# Patient Record
Sex: Male | Born: 1970
Health system: Southern US, Community
[De-identification: ages and names within clinical notes are randomized; demographics above are authoritative.]

---

## 2019-02-23 ENCOUNTER — Emergency Department (HOSPITAL_COMMUNITY): Payer: HRSA Program

## 2019-02-23 ENCOUNTER — Inpatient Hospital Stay (HOSPITAL_COMMUNITY)
Admission: EM | Admit: 2019-02-23 | Discharge: 2019-04-19 | DRG: 004 | Disposition: A | Discharge status: Rehabilitation Facility | Payer: HRSA Program | Attending: Internal Medicine | Admitting: Internal Medicine

## 2019-02-23 ENCOUNTER — Inpatient Hospital Stay (HOSPITAL_COMMUNITY): Payer: HRSA Program

## 2019-02-23 DIAGNOSIS — R1313 Dysphagia, pharyngeal phase: Secondary | ICD-10-CM | POA: Diagnosis not present

## 2019-02-23 DIAGNOSIS — Z930 Tracheostomy status: Secondary | ICD-10-CM

## 2019-02-23 DIAGNOSIS — K567 Ileus, unspecified: Secondary | ICD-10-CM | POA: Diagnosis not present

## 2019-02-23 DIAGNOSIS — R509 Fever, unspecified: Secondary | ICD-10-CM

## 2019-02-23 DIAGNOSIS — I2729 Other secondary pulmonary hypertension: Secondary | ICD-10-CM | POA: Diagnosis not present

## 2019-02-23 DIAGNOSIS — N170 Acute kidney failure with tubular necrosis: Secondary | ICD-10-CM | POA: Diagnosis present

## 2019-02-23 DIAGNOSIS — W1839XA Other fall on same level, initial encounter: Secondary | ICD-10-CM | POA: Diagnosis not present

## 2019-02-23 DIAGNOSIS — N179 Acute kidney failure, unspecified: Secondary | ICD-10-CM

## 2019-02-23 DIAGNOSIS — K7200 Acute and subacute hepatic failure without coma: Secondary | ICD-10-CM | POA: Diagnosis not present

## 2019-02-23 DIAGNOSIS — J9501 Hemorrhage from tracheostomy stoma: Secondary | ICD-10-CM | POA: Diagnosis not present

## 2019-02-23 DIAGNOSIS — J80 Acute respiratory distress syndrome: Secondary | ICD-10-CM | POA: Diagnosis present

## 2019-02-23 DIAGNOSIS — L89810 Pressure ulcer of head, unstageable: Secondary | ICD-10-CM | POA: Diagnosis not present

## 2019-02-23 DIAGNOSIS — R042 Hemoptysis: Secondary | ICD-10-CM | POA: Diagnosis not present

## 2019-02-23 DIAGNOSIS — Z006 Encounter for examination for normal comparison and control in clinical research program: Secondary | ICD-10-CM

## 2019-02-23 DIAGNOSIS — T402X5A Adverse effect of other opioids, initial encounter: Secondary | ICD-10-CM | POA: Diagnosis not present

## 2019-02-23 DIAGNOSIS — E875 Hyperkalemia: Secondary | ICD-10-CM | POA: Diagnosis not present

## 2019-02-23 DIAGNOSIS — Z23 Encounter for immunization: Secondary | ICD-10-CM

## 2019-02-23 DIAGNOSIS — D649 Anemia, unspecified: Secondary | ICD-10-CM | POA: Diagnosis not present

## 2019-02-23 DIAGNOSIS — S0081XA Abrasion of other part of head, initial encounter: Secondary | ICD-10-CM | POA: Diagnosis not present

## 2019-02-23 DIAGNOSIS — Z430 Encounter for attention to tracheostomy: Secondary | ICD-10-CM | POA: Diagnosis not present

## 2019-02-23 DIAGNOSIS — R570 Cardiogenic shock: Secondary | ICD-10-CM | POA: Diagnosis not present

## 2019-02-23 DIAGNOSIS — Z452 Encounter for adjustment and management of vascular access device: Secondary | ICD-10-CM | POA: Diagnosis not present

## 2019-02-23 DIAGNOSIS — R6521 Severe sepsis with septic shock: Secondary | ICD-10-CM | POA: Diagnosis present

## 2019-02-23 DIAGNOSIS — J1289 Other viral pneumonia: Secondary | ICD-10-CM | POA: Diagnosis present

## 2019-02-23 DIAGNOSIS — Y9389 Activity, other specified: Secondary | ICD-10-CM

## 2019-02-23 DIAGNOSIS — D6832 Hemorrhagic disorder due to extrinsic circulating anticoagulants: Secondary | ICD-10-CM | POA: Diagnosis not present

## 2019-02-23 DIAGNOSIS — R7309 Other abnormal glucose: Secondary | ICD-10-CM | POA: Diagnosis not present

## 2019-02-23 DIAGNOSIS — R0902 Hypoxemia: Secondary | ICD-10-CM

## 2019-02-23 DIAGNOSIS — J15211 Pneumonia due to Methicillin susceptible Staphylococcus aureus: Secondary | ICD-10-CM | POA: Diagnosis not present

## 2019-02-23 DIAGNOSIS — Z789 Other specified health status: Secondary | ICD-10-CM | POA: Diagnosis not present

## 2019-02-23 DIAGNOSIS — E669 Obesity, unspecified: Secondary | ICD-10-CM | POA: Diagnosis present

## 2019-02-23 DIAGNOSIS — I959 Hypotension, unspecified: Secondary | ICD-10-CM | POA: Diagnosis not present

## 2019-02-23 DIAGNOSIS — Z419 Encounter for procedure for purposes other than remedying health state, unspecified: Secondary | ICD-10-CM

## 2019-02-23 DIAGNOSIS — T45515A Adverse effect of anticoagulants, initial encounter: Secondary | ICD-10-CM | POA: Diagnosis not present

## 2019-02-23 DIAGNOSIS — Z9289 Personal history of other medical treatment: Secondary | ICD-10-CM | POA: Diagnosis not present

## 2019-02-23 DIAGNOSIS — R000 Tachycardia, unspecified: Secondary | ICD-10-CM | POA: Diagnosis not present

## 2019-02-23 DIAGNOSIS — Z8619 Personal history of other infectious and parasitic diseases: Secondary | ICD-10-CM | POA: Diagnosis not present

## 2019-02-23 DIAGNOSIS — I132 Hypertensive heart and chronic kidney disease with heart failure and with stage 5 chronic kidney disease, or end stage renal disease: Secondary | ICD-10-CM | POA: Diagnosis not present

## 2019-02-23 DIAGNOSIS — R0989 Other specified symptoms and signs involving the circulatory and respiratory systems: Secondary | ICD-10-CM

## 2019-02-23 DIAGNOSIS — E1165 Type 2 diabetes mellitus with hyperglycemia: Secondary | ICD-10-CM | POA: Diagnosis present

## 2019-02-23 DIAGNOSIS — I2609 Other pulmonary embolism with acute cor pulmonale: Secondary | ICD-10-CM | POA: Diagnosis present

## 2019-02-23 DIAGNOSIS — D638 Anemia in other chronic diseases classified elsewhere: Secondary | ICD-10-CM | POA: Diagnosis present

## 2019-02-23 DIAGNOSIS — I2699 Other pulmonary embolism without acute cor pulmonale: Secondary | ICD-10-CM | POA: Diagnosis not present

## 2019-02-23 DIAGNOSIS — I50811 Acute right heart failure: Secondary | ICD-10-CM | POA: Diagnosis not present

## 2019-02-23 DIAGNOSIS — D72829 Elevated white blood cell count, unspecified: Secondary | ICD-10-CM | POA: Diagnosis not present

## 2019-02-23 DIAGNOSIS — K921 Melena: Secondary | ICD-10-CM | POA: Diagnosis not present

## 2019-02-23 DIAGNOSIS — J9600 Acute respiratory failure, unspecified whether with hypoxia or hypercapnia: Secondary | ICD-10-CM

## 2019-02-23 DIAGNOSIS — J9601 Acute respiratory failure with hypoxia: Secondary | ICD-10-CM

## 2019-02-23 DIAGNOSIS — E872 Acidosis: Secondary | ICD-10-CM | POA: Diagnosis present

## 2019-02-23 DIAGNOSIS — J9602 Acute respiratory failure with hypercapnia: Secondary | ICD-10-CM

## 2019-02-23 DIAGNOSIS — J9690 Respiratory failure, unspecified, unspecified whether with hypoxia or hypercapnia: Secondary | ICD-10-CM

## 2019-02-23 DIAGNOSIS — D62 Acute posthemorrhagic anemia: Secondary | ICD-10-CM | POA: Diagnosis not present

## 2019-02-23 DIAGNOSIS — Z01818 Encounter for other preprocedural examination: Secondary | ICD-10-CM

## 2019-02-23 DIAGNOSIS — L8990 Pressure ulcer of unspecified site, unspecified stage: Secondary | ICD-10-CM

## 2019-02-23 DIAGNOSIS — N186 End stage renal disease: Secondary | ICD-10-CM | POA: Diagnosis not present

## 2019-02-23 DIAGNOSIS — Z0189 Encounter for other specified special examinations: Secondary | ICD-10-CM

## 2019-02-23 DIAGNOSIS — E876 Hypokalemia: Secondary | ICD-10-CM | POA: Diagnosis not present

## 2019-02-23 DIAGNOSIS — R112 Nausea with vomiting, unspecified: Secondary | ICD-10-CM

## 2019-02-23 DIAGNOSIS — E119 Type 2 diabetes mellitus without complications: Secondary | ICD-10-CM | POA: Diagnosis not present

## 2019-02-23 DIAGNOSIS — R0603 Acute respiratory distress: Secondary | ICD-10-CM | POA: Diagnosis present

## 2019-02-23 DIAGNOSIS — R069 Unspecified abnormalities of breathing: Secondary | ICD-10-CM

## 2019-02-23 DIAGNOSIS — E1122 Type 2 diabetes mellitus with diabetic chronic kidney disease: Secondary | ICD-10-CM | POA: Diagnosis not present

## 2019-02-23 DIAGNOSIS — A4189 Other specified sepsis: Secondary | ICD-10-CM | POA: Diagnosis present

## 2019-02-23 DIAGNOSIS — G9340 Encephalopathy, unspecified: Secondary | ICD-10-CM | POA: Diagnosis not present

## 2019-02-23 DIAGNOSIS — Z6825 Body mass index (BMI) 25.0-25.9, adult: Secondary | ICD-10-CM

## 2019-02-23 DIAGNOSIS — A419 Sepsis, unspecified organism: Secondary | ICD-10-CM

## 2019-02-23 DIAGNOSIS — D6489 Other specified anemias: Secondary | ICD-10-CM | POA: Diagnosis not present

## 2019-02-23 DIAGNOSIS — Z978 Presence of other specified devices: Secondary | ICD-10-CM

## 2019-02-23 DIAGNOSIS — M7989 Other specified soft tissue disorders: Secondary | ICD-10-CM | POA: Diagnosis not present

## 2019-02-23 DIAGNOSIS — J189 Pneumonia, unspecified organism: Secondary | ICD-10-CM

## 2019-02-23 DIAGNOSIS — Z4659 Encounter for fitting and adjustment of other gastrointestinal appliance and device: Secondary | ICD-10-CM

## 2019-02-23 DIAGNOSIS — J988 Other specified respiratory disorders: Secondary | ICD-10-CM | POA: Diagnosis not present

## 2019-02-23 DIAGNOSIS — R5381 Other malaise: Secondary | ICD-10-CM | POA: Diagnosis not present

## 2019-02-23 DIAGNOSIS — Y92230 Patient room in hospital as the place of occurrence of the external cause: Secondary | ICD-10-CM | POA: Diagnosis not present

## 2019-02-23 DIAGNOSIS — R0602 Shortness of breath: Secondary | ICD-10-CM

## 2019-02-23 DIAGNOSIS — R109 Unspecified abdominal pain: Secondary | ICD-10-CM

## 2019-02-23 LAB — COMPREHENSIVE METABOLIC PANEL
ALT: 125 U/L — ABNORMAL HIGH (ref 0–44)
AST: 204 U/L — ABNORMAL HIGH (ref 15–41)
Albumin: 2.5 g/dL — ABNORMAL LOW (ref 3.5–5.0)
Alkaline Phosphatase: 83 U/L (ref 38–126)
Anion gap: 15 (ref 5–15)
BUN: 13 mg/dL (ref 6–20)
CO2: 17 mmol/L — ABNORMAL LOW (ref 22–32)
Calcium: 7.8 mg/dL — ABNORMAL LOW (ref 8.9–10.3)
Chloride: 105 mmol/L (ref 98–111)
Creatinine, Ser: 0.95 mg/dL (ref 0.61–1.24)
GFR calc Af Amer: >60 mL/min (ref >60)
GFR calc non Af Amer: >60 mL/min (ref >60)
Glucose, Bld: 199 mg/dL — ABNORMAL HIGH (ref 70–99)
Potassium: 4.5 mmol/L (ref 3.5–5.1)
Sodium: 137 mmol/L (ref 135–145)
Total Bilirubin: 1.2 mg/dL (ref 0.3–1.2)
Total Protein: 6.5 g/dL (ref 6.5–8.1)

## 2019-02-23 LAB — BLOOD GAS, ARTERIAL
Acid-base deficit: 5.7 mmol/L — ABNORMAL HIGH (ref 0.0–2.0)
Bicarbonate: 25.1 mmol/L (ref 20.0–28.0)
Drawn by: 270221
FIO2: 100
Nitric Oxide: 40
O2 Saturation: 97.5 %
PEEP: 24 cmH2O
Patient temperature: 98.6
Pressure control: 10 cmH2O
RATE: 24 resp/min
pCO2 arterial: 114 mmHg (ref 32.0–48.0)
pH, Arterial: 6.973 — CL (ref 7.350–7.450)
pO2, Arterial: 189 mmHg — ABNORMAL HIGH (ref 83.0–108.0)

## 2019-02-23 LAB — SARS CORONAVIRUS 2 BY RT PCR (HOSPITAL ORDER, PERFORMED IN ~~LOC~~ HOSPITAL LAB): SARS Coronavirus 2: POSITIVE — AB

## 2019-02-23 LAB — POCT I-STAT 7, (LYTES, BLD GAS, ICA,H+H)
Acid-base deficit: 3 mmol/L — ABNORMAL HIGH (ref 0.0–2.0)
Acid-base deficit: 4 mmol/L — ABNORMAL HIGH (ref 0.0–2.0)
Bicarbonate: 23.2 mmol/L (ref 20.0–28.0)
Bicarbonate: 23.6 mmol/L (ref 20.0–28.0)
Calcium, Ion: 1.09 mmol/L — ABNORMAL LOW (ref 1.15–1.40)
Calcium, Ion: 1 mmol/L — ABNORMAL LOW (ref 1.15–1.40)
HCT: 39 % (ref 39.0–52.0)
HCT: 36 % — ABNORMAL LOW (ref 39.0–52.0)
Hemoglobin: 13.3 g/dL (ref 13.0–17.0)
Hemoglobin: 12.2 g/dL — ABNORMAL LOW (ref 13.0–17.0)
O2 Saturation: 69 %
O2 Saturation: 82 %
Patient temperature: 100
Potassium: 4 mmol/L (ref 3.5–5.1)
Potassium: 4 mmol/L (ref 3.5–5.1)
Sodium: 138 mmol/L (ref 135–145)
Sodium: 139 mmol/L (ref 135–145)
TCO2: 25 mmol/L (ref 22–32)
TCO2: 25 mmol/L (ref 22–32)
pCO2 arterial: 45.3 mmHg (ref 32.0–48.0)
pCO2 arterial: 56.1 mmHg — ABNORMAL HIGH (ref 32.0–48.0)
pH, Arterial: 7.317 — ABNORMAL LOW (ref 7.350–7.450)
pH, Arterial: 7.236 — ABNORMAL LOW (ref 7.350–7.450)
pO2, Arterial: 40 mmHg — CL (ref 83.0–108.0)
pO2, Arterial: 58 mmHg — ABNORMAL LOW (ref 83.0–108.0)

## 2019-02-23 LAB — CBC
HCT: 44 % (ref 39.0–52.0)
Hemoglobin: 14.7 g/dL (ref 13.0–17.0)
MCH: 29.8 pg (ref 26.0–34.0)
MCHC: 33.4 g/dL (ref 30.0–36.0)
MCV: 89.1 fL (ref 80.0–100.0)
Platelets: 166 10*3/uL (ref 150–400)
RBC: 4.94 MIL/uL (ref 4.22–5.81)
RDW: 13.2 % (ref 11.5–15.5)
WBC: 8.2 10*3/uL (ref 4.0–10.5)
nRBC: 0 % (ref 0.0–0.2)

## 2019-02-23 LAB — CBC WITH DIFFERENTIAL/PLATELET
Abs Immature Granulocytes: 0.1 10*3/uL — ABNORMAL HIGH (ref 0.00–0.07)
Basophils Absolute: 0 10*3/uL (ref 0.0–0.1)
Basophils Relative: 0 %
Eosinophils Absolute: 0 10*3/uL (ref 0.0–0.5)
Eosinophils Relative: 0 %
HCT: 44.3 % (ref 39.0–52.0)
Hemoglobin: 14.7 g/dL (ref 13.0–17.0)
Immature Granulocytes: 1 %
Lymphocytes Relative: 13 %
Lymphs Abs: 1.3 10*3/uL (ref 0.7–4.0)
MCH: 29.3 pg (ref 26.0–34.0)
MCHC: 33.2 g/dL (ref 30.0–36.0)
MCV: 88.4 fL (ref 80.0–100.0)
Monocytes Absolute: 0.2 10*3/uL (ref 0.1–1.0)
Monocytes Relative: 2 %
Neutro Abs: 8.5 10*3/uL — ABNORMAL HIGH (ref 1.7–7.7)
Neutrophils Relative %: 84 %
Platelets: 192 10*3/uL (ref 150–400)
RBC: 5.01 MIL/uL (ref 4.22–5.81)
RDW: 12.8 % (ref 11.5–15.5)
WBC: 10.1 10*3/uL (ref 4.0–10.5)
nRBC: 0 % (ref 0.0–0.2)

## 2019-02-23 LAB — TRIGLYCERIDES
Triglycerides: 233 mg/dL — ABNORMAL HIGH (ref <150)
Triglycerides: 526 mg/dL — ABNORMAL HIGH (ref <150)

## 2019-02-23 LAB — FERRITIN: Ferritin: 2066 ng/mL — ABNORMAL HIGH (ref 24–336)

## 2019-02-23 LAB — C-REACTIVE PROTEIN: CRP: 17.8 mg/dL — ABNORMAL HIGH (ref <1.0)

## 2019-02-23 LAB — FIBRINOGEN: Fibrinogen: 756 mg/dL — ABNORMAL HIGH (ref 210–475)

## 2019-02-23 LAB — LACTATE DEHYDROGENASE: LDH: 1172 U/L — ABNORMAL HIGH (ref 98–192)

## 2019-02-23 LAB — ABO/RH: ABO/RH(D): O POS

## 2019-02-23 LAB — D-DIMER, QUANTITATIVE: D-Dimer, Quant: 11.18 ug/mL-FEU — ABNORMAL HIGH (ref 0.00–0.50)

## 2019-02-23 LAB — GLUCOSE, CAPILLARY: Glucose-Capillary: 188 mg/dL — ABNORMAL HIGH (ref 70–99)

## 2019-02-23 LAB — LACTIC ACID, PLASMA: Lactic Acid, Venous: 1.5 mmol/L (ref 0.5–1.9)

## 2019-02-23 LAB — CREATININE, SERUM
Creatinine, Ser: 1.1 mg/dL (ref 0.61–1.24)
GFR calc Af Amer: >60 mL/min (ref >60)
GFR calc non Af Amer: >60 mL/min (ref >60)

## 2019-02-23 LAB — MRSA PCR SCREENING: MRSA by PCR: NEGATIVE

## 2019-02-23 LAB — PROCALCITONIN: Procalcitonin: 4.53 ng/mL

## 2019-02-23 MED ORDER — FENTANYL 2500MCG IN NS 250ML (10MCG/ML) PREMIX INFUSION
0.0000 ug/h | INTRAVENOUS | Status: DC
  Administered 2019-02-23: 250 ug/h via INTRAVENOUS
  Filled 2019-02-23: qty 250

## 2019-02-23 MED ORDER — ROCURONIUM BROMIDE 50 MG/5ML IV SOLN
50.0000 mg | Freq: Once | INTRAVENOUS | Status: AC
  Administered 2019-02-23: 50 mg via INTRAVENOUS
  Filled 2019-02-23: qty 5

## 2019-02-23 MED ORDER — FENTANYL CITRATE (PF) 100 MCG/2ML IJ SOLN: 50.0000 ug | Freq: Once | INTRAMUSCULAR | Status: AC

## 2019-02-23 MED ORDER — FENTANYL 2500MCG IN NS 250ML (10MCG/ML) PREMIX INFUSION
50.0000 ug/h | INTRAVENOUS | Status: DC
  Administered 2019-02-24: 125 ug/h via INTRAVENOUS
  Administered 2019-02-25: 04:00:00 200 ug/h via INTRAVENOUS
  Filled 2019-02-23 (×3): qty 250

## 2019-02-23 MED ORDER — SODIUM CHLORIDE 0.9 % IV BOLUS: 500.0000 mL | Freq: Once | INTRAVENOUS | Status: AC

## 2019-02-23 MED ORDER — FENTANYL CITRATE (PF) 100 MCG/2ML IJ SOLN
100.0000 ug | Freq: Once | INTRAMUSCULAR | Status: AC
  Administered 2019-02-23: 10:00:00 100 ug via INTRAVENOUS

## 2019-02-23 MED ORDER — HYDROXYCHLOROQUINE SULFATE 200 MG PO TABS
400.0000 mg | ORAL_TABLET | Freq: Two times a day (BID) | ORAL | Status: AC
  Administered 2019-02-23 (×2): 400 mg via ORAL
  Filled 2019-02-23 (×2): qty 2

## 2019-02-23 MED ORDER — ETOMIDATE 2 MG/ML IV SOLN
INTRAVENOUS | Status: AC | PRN
  Administered 2019-02-23: 30 mg via INTRAVENOUS

## 2019-02-23 MED ORDER — ACETAMINOPHEN 650 MG RE SUPP
650.0000 mg | Freq: Four times a day (QID) | RECTAL | Status: DC | PRN
  Administered 2019-02-23 – 2019-02-26 (×3): 650 mg via RECTAL
  Filled 2019-02-23 (×2): qty 1

## 2019-02-23 MED ORDER — DOCUSATE SODIUM 100 MG PO CAPS: 100.0000 mg | ORAL_CAPSULE | Freq: Two times a day (BID) | ORAL | Status: DC

## 2019-02-23 MED ORDER — VITAL AF 1.2 CAL PO LIQD
1000.0000 mL | ORAL | Status: DC
  Administered 2019-02-23 – 2019-02-28 (×6): 1000 mL

## 2019-02-23 MED ORDER — FENTANYL BOLUS VIA INFUSION
50.0000 ug | INTRAVENOUS | Status: DC | PRN
  Administered 2019-02-23 – 2019-02-24 (×2): 50 ug via INTRAVENOUS
  Filled 2019-02-23: qty 50

## 2019-02-23 MED ORDER — SODIUM CHLORIDE 0.9 % IV BOLUS
500.0000 mL | Freq: Once | INTRAVENOUS | Status: AC
  Administered 2019-02-23: 500 mL via INTRAVENOUS

## 2019-02-23 MED ORDER — SODIUM BICARBONATE 8.4 % IV SOLN
INTRAVENOUS | Status: AC
  Filled 2019-02-23: qty 100

## 2019-02-23 MED ORDER — DOCUSATE SODIUM 50 MG/5ML PO LIQD
100.0000 mg | Freq: Two times a day (BID) | ORAL | Status: DC
  Administered 2019-02-24 – 2019-03-19 (×42): 100 mg
  Filled 2019-02-23 (×47): qty 10

## 2019-02-23 MED ORDER — ALBUMIN HUMAN 25 % IV SOLN
25.0000 g | Freq: Once | INTRAVENOUS | Status: AC
  Administered 2019-02-23: 25 g via INTRAVENOUS
  Filled 2019-02-23: qty 50

## 2019-02-23 MED ORDER — PROPOFOL 1000 MG/100ML IV EMUL
5.0000 ug/kg/min | INTRAVENOUS | Status: DC
  Administered 2019-02-23: 35 ug/kg/min via INTRAVENOUS
  Administered 2019-02-23: 80 ug/kg/min via INTRAVENOUS
  Administered 2019-02-23: 60 ug/kg/min via INTRAVENOUS
  Filled 2019-02-23 (×3): qty 100

## 2019-02-23 MED ORDER — VITAL HIGH PROTEIN PO LIQD: 1000.0000 mL | ORAL | Status: DC

## 2019-02-23 MED ORDER — VECURONIUM BROMIDE 10 MG IV SOLR
0.1000 mg/kg | INTRAVENOUS | Status: DC | PRN
  Administered 2019-02-23 – 2019-02-27 (×8): 7.8 mg via INTRAVENOUS
  Filled 2019-02-23 (×9): qty 10

## 2019-02-23 MED ORDER — CLONAZEPAM 0.5 MG PO TABS
2.0000 mg | ORAL_TABLET | Freq: Four times a day (QID) | ORAL | Status: DC
  Administered 2019-02-23 – 2019-02-24 (×3): 2 mg
  Filled 2019-02-23 (×3): qty 4

## 2019-02-23 MED ORDER — HEPARIN SODIUM (PORCINE) 5000 UNIT/ML IJ SOLN
5000.0000 [IU] | Freq: Three times a day (TID) | INTRAMUSCULAR | Status: DC
  Administered 2019-02-23: 5000 [IU] via SUBCUTANEOUS
  Filled 2019-02-23: qty 1

## 2019-02-23 MED ORDER — FENTANYL CITRATE (PF) 100 MCG/2ML IJ SOLN
INTRAMUSCULAR | Status: AC
  Administered 2019-02-23: 100 ug via INTRAVENOUS
  Filled 2019-02-23: qty 2

## 2019-02-23 MED ORDER — SODIUM CHLORIDE 0.9 % IV SOLN
500.0000 mg | INTRAVENOUS | Status: DC
  Administered 2019-02-23 – 2019-02-26 (×4): 500 mg via INTRAVENOUS
  Filled 2019-02-23 (×5): qty 500

## 2019-02-23 MED ORDER — QUETIAPINE FUMARATE 50 MG PO TABS
50.0000 mg | ORAL_TABLET | Freq: Two times a day (BID) | ORAL | Status: DC
  Administered 2019-02-23 – 2019-03-17 (×42): 50 mg via ORAL
  Filled 2019-02-23 (×43): qty 1

## 2019-02-23 MED ORDER — FENTANYL CITRATE (PF) 100 MCG/2ML IJ SOLN
INTRAMUSCULAR | Status: AC
  Administered 2019-02-23: 100 ug
  Filled 2019-02-23: qty 2

## 2019-02-23 MED ORDER — ROCURONIUM BROMIDE 50 MG/5ML IV SOLN
INTRAVENOUS | Status: AC | PRN
  Administered 2019-02-23: 100 mg via INTRAVENOUS

## 2019-02-23 MED ORDER — NOREPINEPHRINE 4 MG/250ML-% IV SOLN
0.0000 ug/min | INTRAVENOUS | Status: DC
  Administered 2019-02-23: 2 ug/min via INTRAVENOUS
  Administered 2019-02-23 – 2019-02-24 (×3): 40 ug/min via INTRAVENOUS
  Administered 2019-02-24: 18 ug/min via INTRAVENOUS
  Administered 2019-02-24: 40 ug/min via INTRAVENOUS
  Administered 2019-02-24: 38 ug/min via INTRAVENOUS
  Administered 2019-02-24: 22 ug/min via INTRAVENOUS
  Administered 2019-02-24: 34 ug/min via INTRAVENOUS
  Filled 2019-02-23 (×12): qty 250

## 2019-02-23 MED ORDER — MIDAZOLAM HCL 2 MG/2ML IJ SOLN
2.0000 mg | INTRAMUSCULAR | Status: AC | PRN
  Administered 2019-02-23 – 2019-02-27 (×3): 2 mg via INTRAVENOUS
  Filled 2019-02-23: qty 2

## 2019-02-23 MED ORDER — LORAZEPAM 2 MG/ML IJ SOLN
4.0000 mg | INTRAMUSCULAR | Status: DC | PRN
  Administered 2019-02-25 – 2019-03-04 (×10): 4 mg via INTRAVENOUS
  Filled 2019-02-23 (×11): qty 2

## 2019-02-23 MED ORDER — SODIUM BICARBONATE 8.4 % IV SOLN
100.0000 meq | Freq: Once | INTRAVENOUS | Status: AC
  Administered 2019-02-23: 100 meq via INTRAVENOUS

## 2019-02-23 MED ORDER — MIDAZOLAM HCL 2 MG/2ML IJ SOLN
2.0000 mg | INTRAMUSCULAR | Status: DC | PRN
  Administered 2019-02-27 – 2019-03-08 (×24): 2 mg via INTRAVENOUS
  Filled 2019-02-23 (×13): qty 2

## 2019-02-23 MED ORDER — VASOPRESSIN 20 UNIT/ML IV SOLN
0.0300 [IU]/min | INTRAVENOUS | Status: DC
  Administered 2019-02-23 – 2019-02-24 (×2): 0.03 [IU]/min via INTRAVENOUS
  Filled 2019-02-23 (×2): qty 2

## 2019-02-23 MED ORDER — SODIUM CHLORIDE 0.9 % IV SOLN
INTRAVENOUS | Status: DC
  Administered 2019-02-24 – 2019-03-01 (×5): via INTRAVENOUS

## 2019-02-23 MED ORDER — FENTANYL 100 MCG/HR TD PT72
1.0000 | MEDICATED_PATCH | TRANSDERMAL | Status: DC
  Administered 2019-02-23 – 2019-02-26 (×2): 1 via TRANSDERMAL
  Filled 2019-02-23 (×2): qty 1

## 2019-02-23 MED ORDER — SODIUM CHLORIDE 0.9 % IV SOLN: INTRAVENOUS | Status: DC | PRN

## 2019-02-23 MED ORDER — ARTIFICIAL TEARS OPHTHALMIC OINT
1.0000 "application " | TOPICAL_OINTMENT | Freq: Three times a day (TID) | OPHTHALMIC | Status: DC
  Administered 2019-02-23 – 2019-02-27 (×12): 1 via OPHTHALMIC
  Filled 2019-02-23 (×3): qty 3.5

## 2019-02-23 MED ORDER — INSULIN ASPART 100 UNIT/ML ~~LOC~~ SOLN
2.0000 [IU] | SUBCUTANEOUS | Status: DC
  Administered 2019-02-23: 15:00:00 4 [IU] via SUBCUTANEOUS
  Administered 2019-02-23 – 2019-02-24 (×3): 6 [IU] via SUBCUTANEOUS

## 2019-02-23 MED ORDER — FENTANYL CITRATE (PF) 100 MCG/2ML IJ SOLN
100.0000 ug | INTRAMUSCULAR | Status: DC | PRN
  Administered 2019-02-24 (×2): 100 ug via INTRAVENOUS

## 2019-02-23 MED ORDER — PROPOFOL 10 MG/ML IV BOLUS
INTRAVENOUS | Status: AC
  Filled 2019-02-23: qty 20

## 2019-02-23 MED ORDER — ACETAMINOPHEN 650 MG RE SUPP
650.0000 mg | Freq: Once | RECTAL | Status: AC
  Filled 2019-02-23: qty 1

## 2019-02-23 MED ORDER — FENTANYL CITRATE (PF) 100 MCG/2ML IJ SOLN
100.0000 ug | INTRAMUSCULAR | Status: DC | PRN
  Administered 2019-02-23: 100 ug via INTRAVENOUS
  Filled 2019-02-23: qty 2

## 2019-02-23 MED ORDER — PROPOFOL 1000 MG/100ML IV EMUL
INTRAVENOUS | Status: AC
  Administered 2019-02-23: 11:00:00
  Filled 2019-02-23: qty 100

## 2019-02-23 MED ORDER — HYDROXYCHLOROQUINE SULFATE 200 MG PO TABS
200.0000 mg | ORAL_TABLET | Freq: Two times a day (BID) | ORAL | Status: DC
  Filled 2019-02-23 (×2): qty 1

## 2019-02-23 MED ORDER — ENOXAPARIN SODIUM 40 MG/0.4ML ~~LOC~~ SOLN
0.5000 mg/kg | Freq: Two times a day (BID) | SUBCUTANEOUS | Status: DC
  Administered 2019-02-23 – 2019-02-24 (×2): 40 mg via SUBCUTANEOUS
  Filled 2019-02-23 (×2): qty 0.4

## 2019-02-23 MED ORDER — FENTANYL CITRATE (PF) 100 MCG/2ML IJ SOLN
INTRAMUSCULAR | Status: AC | PRN
  Administered 2019-02-23: 100 ug via INTRAVENOUS

## 2019-02-23 MED ORDER — FAMOTIDINE 40 MG/5ML PO SUSR
20.0000 mg | Freq: Two times a day (BID) | ORAL | Status: DC
  Administered 2019-02-23 – 2019-02-24 (×3): 20 mg
  Filled 2019-02-23 (×4): qty 2.5

## 2019-02-23 MED ORDER — PROPOFOL 1000 MG/100ML IV EMUL
INTRAVENOUS | Status: AC
  Administered 2019-02-23: 74 ug/kg/min
  Filled 2019-02-23: qty 100

## 2019-02-23 MED ORDER — SODIUM CHLORIDE 0.9 % IV SOLN
2.0000 g | INTRAVENOUS | Status: AC
  Administered 2019-02-23 – 2019-02-27 (×5): 2 g via INTRAVENOUS
  Filled 2019-02-23 (×5): qty 20

## 2019-02-23 NOTE — Progress Notes (Signed)
[facility] Progress Note Patient Name: Jonathan [name] DOB: January 28, 1971 MRN: [id number]   Date of Service  02/23/2019  HPI/Events of Note  Notified of hypotension and oliguria despite coming down on sedation. Still on propofol 35 and Fentanyl 100  eICU Interventions   Will give a dose of albumin and reassess  Continue to titrate down sedation as patient continues to be sedated and synchronized on the vent     Intervention Category Major Interventions: Hypotension - evaluation and management  Judd Lien 02/23/2019, 10:54 PM

## 2019-02-23 NOTE — Progress Notes (Signed)
Initial Nutrition Assessment  DOCUMENTATION CODES:   Not applicable  INTERVENTION:   Per ASPEN guidelines, recommend continue gastric feedings during prone positioning, keeping HOB elevated (reverse trendelenburg) to at least 10-25 degrees.    Vital AF 1.2 at 20 ml/h (trickle per MD order)  Recommend increase by 10 ml every 8 hours to goal rate of 75 ml/h (1800 ml per day)  Provides 2160 kcal, 135 gm protein, 1460 ml free water daily  NUTRITION DIAGNOSIS:   Inadequate oral intake related to inability to eat as evidenced by NPO status.  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 Admitted with fever, cough, AMS, and respiratory distress. COVID-19 positive.    Patient is currently intubated on ventilator support. Currently proned.  MV: 9.6 L/min Temp (24hrs), Avg:103.1 F (39.5 C), Min:103 F (39.4 C), Max:103.2 F (39.6 C)  Propofol: currently off   Labs reviewed. Medications reviewed and include Novolog, Fentanyl, Levophed, propofol.   NUTRITION - FOCUSED PHYSICAL EXAM:  unable to complete  Diet Order:   Diet Order            Diet NPO time specified  Diet effective now              EDUCATION NEEDS:   No education needs have been identified at this time  Skin:  Skin Assessment: Reviewed RN Assessment  Last BM:  unknown  Height:   Ht Readings from Last 1 Encounters:  02/23/19 5\' 5"  (1.651 m)    Weight:   Wt Readings from Last 1 Encounters:  02/23/19 77.7 kg    Ideal Body Weight:  61.8 kg  BMI:  Body mass index is 28.51 kg/m.  Estimated Nutritional Needs:   Kcal:  2210  Protein:  115-130 gm  Fluid:  2.2 L    Molli Barrows, RD, LDN,  [place] Pager [phone] After Hours Pager [phone]

## 2019-02-23 NOTE — Progress Notes (Signed)
MD notified of panic ABG values.  Discussed vent changes with MD and applied appropriate changes to ventilator.  Repeat ABG when appropriate.

## 2019-02-23 NOTE — Procedures (Signed)
Arterial Catheter Insertion Procedure Note Jonathan [name] [id number] October 08, 1971  Procedure: Insertion of Arterial Catheter  Indications: Blood pressure monitoring and Frequent blood sampling  Procedure Details Consent: Unable to obtain consent because of emergent medical necessity. Time Out: Verified patient identification, verified procedure, site/side was marked, verified correct patient position, special equipment/implants available, medications/allergies/relevent history reviewed, required imaging and test results available.  Performed  Maximum sterile technique was used including antiseptics, cap, gloves, gown, hand hygiene and mask. Skin prep: Chlorhexidine; local anesthetic administered 20 gauge catheter was inserted into left radial artery using the Seldinger technique. ULTRASOUND GUIDANCE USED: YES Evaluation Blood flow good; BP tracing good. Complications: No apparent complications.   Ulice Dash 02/23/2019

## 2019-02-23 NOTE — Progress Notes (Signed)
Positive COVID-19 patient with verbal orders to defer 12 lead EKG at this time per CCM, Dr. Lynetta Mare.

## 2019-02-23 NOTE — ED Notes (Signed)
EDP inserting central line at this time.

## 2019-02-23 NOTE — H&P (Signed)
NAME:  Jonathan [name], MRN:  [id number], DOB:  1970-12-10, LOS: 0 ADMISSION DATE:  02/23/2019, CONSULTATION DATE:  02/23/2019 REFERRING MD:  Lian[facility] ED, CHIEF COMPLAINT:  Respiratory failure   Brief History   48 year old man intubated for severe respiratory failure requiring prone ventilation and nitric oxide for ARDS related to COVID-19.  History of present illness   History obtained from notes and patients employer.  48 year old man, recently diagnosed with T2DM. 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 Fever, mild cough and malaise starting 02/21/2019. Some improvement yesterday but developed respiratory distress and altered mental status.  Intubated in ED.   Past Medical History  Limited information. T2DM recently diagnosed on OHA.   Significant Hospital Events   4/21 admitted and immediately proned on inverse ratio PC ventilation with high PEEP to maintain saturation >85%, Started on NO at 40ppm.  Consults:  PCCM 4/21  Procedures:  4/21 - ETT 7.5, RIJ [facility]  Significant Diagnostic Tests:  4/21 CXR (reviewed by Dr Lynetta Mare) - bilateral alveolar interstitial pattern.  Micro Data:  Blood cultures -PND SARS-CoV2 - POSITIVE   Antimicrobials:  Azithromycin 4/21 Ceftriaxone 4/21 Hydroxychloroquine 4/21   Interim history/subjective:  Continued low saturations with nadir in 60's.  Sedated and NMB initiated with improvement to 70's. Proned on arrival with saturation drop back to 50's. Improved with increased PEEP to 24 and inverse ratio PC ventilation. NO initiated with improvement in saturations.   Objective   Blood pressure 140/89, pulse (!) 126, temperature (!) 103 F (39.4 C), temperature source Oral, resp. rate (!) 24, height 5\' 5"  (1.651 m), SpO2 92 %.    Vent Mode: PCV FiO2 (%):  [100 %] 100 % Set Rate:  [24 bmp] 24 bmp Vt Set:  [370 mL] 370 mL  PEEP:  [15 cmH20-24 cmH20] 24 cmH20 Plateau Pressure:  [25 cmH20-37 cmH20] 37 cmH20   Intake/Output Summary (Last 24 hours) at 02/23/2019 1241 Last data filed at 02/23/2019 1200 Gross per 24 hour  Intake -  Output 450 ml  Net -450 ml   There were no vitals filed for this visit.  Examination: General: Appears in good general health, moderately overweight. HENT: OGT/ETT with pressure ulceration. Lungs: Marked bronchial breathing at both bases. Pplat 35 on 24 PEEP. Driving pressure 10 provides   Cardiovascular: JVP flat. HS normal. Extremities warm, no edema. Abdomen: Soft and non-tender. Extremities: No joint swelling.   Neuro: Sedated and chemically paralyzed. GU: Foley catheter inserted, clear urine.  I performed a POC U/S: Bilateral interstitial pattern with bilateral dense consolidation at bases. Echo shows low-normal LVSF. RV moderately dilated with septal shift.   Resolved Hospital Problem list   No   Assessment & Plan:   Active issues:    Critically ill due to severe hypoxic respiratory failure requiring prone mechanical ventilation and NO Prone mechanical ventilation for 16-24 hours Avoid pressure ulceration Nitric oxide at 40ppm - can go to 80ppm (suggested in NOARDSCOVID trial) Possible early acute cor pulmonale - should improve with prone ventilation.  Severe ARDS - P/F 40. Lung protective ventilation - Continue inverse ratio ventilation, high PEEP  until after next turn, then attempt conventional ventilation mode. Continue NMB while on prone ventilation. Conservative fluid strategy - target total fluid intake of 75-111ml/h per day.  Confirmed COVID 19 infection Hydroxychloroquine per protocol  Other medical conditions  T2DM Phase 1 glycemic  protocol  Best practice:  Diet: trickle feed only while prone Pain/Anxiety/Delirium protocol (if indicated): Fentanyl, Propofol gtts, intermittent lorazepam. Target BIS <40. Start topical fentanyl, clonazepam and  quetiapine. VAP protocol (if indicated): Bundle in place. DVT prophylaxis: UFH GI prophylaxis: famotidine Glucose control: Phase 1 protocol Mobility: bedrest Code Status: Full Family Communication: family in [place]. Have communicated with employer. Disposition: ICU.  Labs   CBC: Recent Labs  Lab 02/23/19 0946 02/23/19 1004  WBC 10.1  --   NEUTROABS 8.5*  --   HGB 14.7 13.3  HCT 44.3 39.0  MCV 88.4  --   PLT 192  --     Basic Metabolic Panel: Recent Labs  Lab 02/23/19 0946 02/23/19 1004  NA 137 138  K 4.5 4.0  CL 105  --   CO2 17*  --   GLUCOSE 199*  --   BUN 13  --   CREATININE 0.95  --   CALCIUM 7.8*  --    GFR: CrCl cannot be calculated (Unknown ideal weight.). Recent Labs  Lab 02/23/19 0946  PROCALCITON 4.53  WBC 10.1    Liver Function Tests: Recent Labs  Lab 02/23/19 0946  AST 204*  ALT 125*  ALKPHOS 83  BILITOT 1.2  PROT 6.5  ALBUMIN 2.5*   No results for input(s): LIPASE, AMYLASE in the last 168 hours. No results for input(s): AMMONIA in the last 168 hours.  ABG    Component Value Date/Time   PHART 7.317 (L) 02/23/2019 1004   PCO2ART 45.3 02/23/2019 1004   PO2ART 40.0 (LL) 02/23/2019 1004   HCO3 23.2 02/23/2019 1004   TCO2 25 02/23/2019 1004   ACIDBASEDEF 3.0 (H) 02/23/2019 1004   O2SAT 69.0 02/23/2019 1004     Coagulation Profile: No results for input(s): INR, PROTIME in the last 168 hours.  Cardiac Enzymes: No results for input(s): CKTOTAL, CKMB, CKMBINDEX, TROPONINI in the last 168 hours.  HbA1C: No results found for: HGBA1C  CBG: No results for input(s): GLUCAP in the last 168 hours.  Review of Systems:   Review of Systems - unable to provide due to critical illness.  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  Family History   His family history is not on file.   Allergies Allergies not on file   Home Medications  Prior to Admission medications   Not on File     CRITICAL CARE Performed  by: Kipp Brood   Total critical care time: 120 minutes  Critical care time was exclusive of separately billable procedures and treating other patients.  Critical care was necessary to treat or prevent imminent or life-threatening deterioration.  Critical care was time spent personally by me on the following activities: development of treatment plan with patient and/or surrogate as well as nursing, discussions with consultants, evaluation of patient's response to treatment, examination of patient, obtaining history from patient or surrogate, ordering and performing treatments and interventions, ordering and review of laboratory studies, ordering and review of radiographic studies, pulse oximetry, re-evaluation of patient's condition and participation in multidisciplinary rounds.  Kipp Brood, MD [facility] [place]  Pager: [phone] Mobile: [phone] After hours: [phone].

## 2019-02-23 NOTE — Progress Notes (Signed)
S/W Dr. Bedelia Person re: pt BP.  She suggested reducing Propofol to help BP even though pt BIS reading high 50s to mid 60s since clinically pt looks comfortable.  Well reduce propofol and continue to monitor closely.

## 2019-02-23 NOTE — ED Provider Notes (Signed)
[facility] EMERGENCY DEPARTMENT Provider Note   CSN: [id number] Arrival date & time: 02/23/19  3716  LEVEL 5 CAVEAT - RESPIRATORY DISTRESS  History   Chief Complaint No chief complaint on file.   HPI Derrien Sanda Linger is a 48 y.o. male.     HPI  48 year old male presents with respiratory distress.  Patient history is taken from EMS as the patient is altered and speaks Spanish only.  According to EMS he recently got back from [place] about 9 days ago.  Has had cough for the last few days and this morning family was concerned that he was having a diabetic crisis because he was acting altered.  Initial O2 sat 50% with EMS.  He is on a nonrebreather currently but EMS stated that he has had a couple periods of apnea and had to be bag-valve-mask ventilated.  He is now awake and a little more compliant though cannot answer questions due to language barrier and/or altered mental state.  Apparently he was due to get coronavirus testing today.  No past medical history on file.  There are no active problems to display for this patient.       Home Medications    Prior to Admission medications   Not on File    Family History No family history on file.  Social History Social History   Tobacco Use  . Smoking status: Not on file  Substance Use Topics  . Alcohol use: Not on file  . Drug use: Not on file     Allergies   Patient has no allergy information on record.   Review of Systems Review of Systems  Unable to perform ROS: Severe respiratory distress     Physical Exam Updated Vital Signs BP (!) 181/94   Pulse (!) 128   Temp (!) 103 F (39.4 C) (Oral)   Resp (!) 25   Ht 5\' 7"  (1.702 m)   SpO2 (!) 74%   Physical Exam Vitals signs and nursing note reviewed.  Constitutional:      General: He is in acute distress.     Appearance: He is well-developed. He is ill-appearing and diaphoretic.  HENT:     Head: Normocephalic and atraumatic.   Right Ear: External ear normal.     Left Ear: External ear normal.     Nose: Nose normal.  Eyes:     General:        Right eye: No discharge.        Left eye: No discharge.  Neck:     Musculoskeletal: Normal range of motion and neck supple.  Cardiovascular:     Rate and Rhythm: Regular rhythm. Tachycardia present.  Pulmonary:     Effort: Tachypnea, accessory muscle usage and respiratory distress present.  Abdominal:     General: There is no distension.  Skin:    General: Skin is warm.  Neurological:     Mental Status: He is alert.     Comments: Alert, seems to look at me when prompted but does not answer questions to me or interpreter  Psychiatric:        Mood and Affect: Mood is not anxious.      ED Treatments / Results  Labs (all labs ordered are listed, but only abnormal results are displayed) Labs Reviewed  SARS CORONAVIRUS 2 ([facility]) - Abnormal; Notable for the following components:      Result Value   SARS Coronavirus 2  POSITIVE (*)    All other components within normal limits  CBC WITH DIFFERENTIAL/PLATELET - Abnormal; Notable for the following components:   Neutro Abs 8.5 (*)    Abs Immature Granulocytes 0.10 (*)    All other components within normal limits  COMPREHENSIVE METABOLIC PANEL - Abnormal; Notable for the following components:   CO2 17 (*)    Glucose, Bld 199 (*)    Calcium 7.8 (*)    Albumin 2.5 (*)    AST 204 (*)    ALT 125 (*)    All other components within normal limits  D-DIMER, QUANTITATIVE (NOT AT [facility]) - Abnormal; Notable for the following components:   D-Dimer, Quant 11.18 (*)    All other components within normal limits  LACTATE DEHYDROGENASE - Abnormal; Notable for the following components:   LDH 1,172 (*)    All other components within normal limits  TRIGLYCERIDES - Abnormal; Notable for the following components:   Triglycerides 233 (*)    All other components within normal limits   FIBRINOGEN - Abnormal; Notable for the following components:   Fibrinogen 756 (*)    All other components within normal limits  C-REACTIVE PROTEIN - Abnormal; Notable for the following components:   CRP 17.8 (*)    All other components within normal limits  FERRITIN - Abnormal; Notable for the following components:   Ferritin 2,066 (*)    All other components within normal limits  POCT I-STAT 7, (LYTES, BLD GAS, ICA,H+H) - Abnormal; Notable for the following components:   pH, Arterial 7.317 (*)    pO2, Arterial 40.0 (*)    Acid-base deficit 3.0 (*)    Calcium, Ion 1.09 (*)    All other components within normal limits  MRSA PCR SCREENING  CULTURE, BLOOD (ROUTINE X 2)  CULTURE, BLOOD (ROUTINE X 2)  LACTIC ACID, PLASMA  PROCALCITONIN  CBC  CREATININE, SERUM  BLOOD GAS, ARTERIAL  URINALYSIS, ROUTINE W REFLEX MICROSCOPIC  HIV ANTIBODY (ROUTINE TESTING W REFLEX)  INTERLEUKIN-6, PLASMA  LEGIONELLA PNEUMOPHILA SEROGP 1 UR AG  TRIGLYCERIDES  ABO/RH    EKG None  Radiology No results found.  Procedures Procedure Name: Intubation Date/Time: 02/23/2019 10:36 AM Performed by: Sherwood Gambler, MD Pre-anesthesia Checklist: Suction available and Emergency Drugs available Oxygen Delivery Method: Non-rebreather mask Preoxygenation: Pre-oxygenation with 100% oxygen Induction Type: Rapid sequence Laryngoscope Size: Glidescope and 3 Grade View: Grade II Tube size: 7.5 mm Number of attempts: 1 Placement Confirmation: ETT inserted through vocal cords under direct vision and CO2 detector Secured at: 23 cm Tube secured with: ETT holder Dental Injury: Injury to lip  Difficulty Due To: Difficulty was unanticipated Comments: Airway was anterior, difficult to get to but then easy to pass tube    .Central Line Date/Time: 02/23/2019 10:38 AM Performed by: Sherwood Gambler, MD Authorized by: Sherwood Gambler, MD   Consent:    Consent obtained:  Emergent situation Pre-procedure details:     Skin preparation:  ChloraPrep   Skin preparation agent: Skin preparation agent completely dried prior to procedure   Anesthesia (see MAR for exact dosages):    Anesthesia method:  None Procedure details:    Location:  R internal jugular   Patient position:  Trendelenburg   Procedural supplies:  Triple lumen   Catheter size:  7.5 Fr   Landmarks identified: yes     Ultrasound guidance: yes     Sterile ultrasound techniques: Sterile gel and sterile probe covers were used     Number of attempts:  2   Successful placement: yes   Post-procedure details:    Post-procedure:  Dressing applied and line sutured   Assessment:  Blood return through all ports, free fluid flow, placement verified by x-ray and no pneumothorax on x-ray   Patient tolerance of procedure:  Tolerated well, no immediate complications .Critical Care Performed by: Sherwood Gambler, MD Authorized by: Sherwood Gambler, MD   Critical care provider statement:    Critical care time (minutes):  60   Critical care time was exclusive of:  Separately billable procedures and treating other patients   Critical care was necessary to treat or prevent imminent or life-threatening deterioration of the following conditions:  Respiratory failure, shock and circulatory failure   Critical care was time spent personally by me on the following activities:  Discussions with consultants, evaluation of patient's response to treatment, examination of patient, ordering and performing treatments and interventions, ordering and review of laboratory studies, ordering and review of radiographic studies, pulse oximetry, re-evaluation of patient's condition, obtaining history from patient or surrogate, review of old charts and ventilator management   (including critical care time)  Medications Ordered in ED Medications  propofol (DIPRIVAN) 1000 MG/100ML infusion (has no administration in time range)  fentaNYL (SUBLIMAZE) 100 MCG/2ML injection (100 mcg   Given 02/23/19 0939)  etomidate (AMIDATE) injection (30 mg Intravenous Given 02/23/19 0934)  rocuronium (ZEMURON) injection (100 mg Intravenous Given 02/23/19 0935)  fentaNYL (SUBLIMAZE) injection (100 mcg Intravenous Given 02/23/19 0939)  fentaNYL (SUBLIMAZE) injection 100 mcg (100 mcg Intravenous Given 02/23/19 1007)     Initial Impression / Assessment and Plan / ED Course  I have reviewed the triage vital signs and the nursing notes.  Pertinent labs & imaging results that were available during my care of the patient were reviewed by me and considered in my medical decision making (see chart for details).        Patient was initially given oxygen support with nasal cannula and nonrebreather, however still had significant hypoxia.  O2 sats came up to about 82%.  Difficult IV access but once IV was obtained, he was intubated with RSI.  Initially he had been given a small dose of etomidate but his IV blew and then there was a delay while he had to get a second IV for RSI meds.  During intubation he did dramatically drop his O2 sats though they then slowly came back up with ventilatory support.  Given poor access, central line also placed as above.  Patient was given broad antibiotics for pneumonia though this is most likely from COVID-19.  He is still having difficulty oxygenating with increasing PEEP.  ICU consulted and will admit.  Miller was evaluated in Emergency Department on 02/23/2019 for the symptoms described in the history of present illness. He was evaluated in the context of the global COVID-19 pandemic, which necessitated consideration that the patient might be at risk for infection with the SARS-CoV-2 virus that causes COVID-19. Institutional protocols and algorithms that pertain to the evaluation of patients at risk for COVID-19 are in a state of rapid change based on information released by regulatory bodies including the CDC and federal and state organizations. These  policies and algorithms were followed during the patient's care in the ED.   Final Clinical Impressions(s) / ED Diagnoses   Final diagnoses:  Acute respiratory failure with hypoxia [facility])  COVID-19    ED Discharge Orders    None       Sherwood Gambler, MD 02/23/19  1517  

## 2019-02-24 ENCOUNTER — Inpatient Hospital Stay (HOSPITAL_COMMUNITY): Payer: HRSA Program

## 2019-02-24 DIAGNOSIS — J80 Acute respiratory distress syndrome: Secondary | ICD-10-CM

## 2019-02-24 LAB — COMPREHENSIVE METABOLIC PANEL
ALT: 100 U/L — ABNORMAL HIGH (ref 0–44)
AST: 176 U/L — ABNORMAL HIGH (ref 15–41)
Albumin: 2 g/dL — ABNORMAL LOW (ref 3.5–5.0)
Alkaline Phosphatase: 56 U/L (ref 38–126)
Anion gap: 14 (ref 5–15)
BUN: 26 mg/dL — ABNORMAL HIGH (ref 6–20)
CO2: 19 mmol/L — ABNORMAL LOW (ref 22–32)
Calcium: 6.6 mg/dL — ABNORMAL LOW (ref 8.9–10.3)
Chloride: 107 mmol/L (ref 98–111)
Creatinine, Ser: 2.83 mg/dL — ABNORMAL HIGH (ref 0.61–1.24)
GFR calc Af Amer: 29 mL/min — ABNORMAL LOW (ref >60)
GFR calc non Af Amer: 25 mL/min — ABNORMAL LOW (ref >60)
Glucose, Bld: 273 mg/dL — ABNORMAL HIGH (ref 70–99)
Potassium: 4.5 mmol/L (ref 3.5–5.1)
Sodium: 140 mmol/L (ref 135–145)
Total Bilirubin: 0.9 mg/dL (ref 0.3–1.2)
Total Protein: 5.2 g/dL — ABNORMAL LOW (ref 6.5–8.1)

## 2019-02-24 LAB — POCT I-STAT 7, (LYTES, BLD GAS, ICA,H+H)
Acid-base deficit: 7 mmol/L — ABNORMAL HIGH (ref 0.0–2.0)
Acid-base deficit: 8 mmol/L — ABNORMAL HIGH (ref 0.0–2.0)
Acid-base deficit: 8 mmol/L — ABNORMAL HIGH (ref 0.0–2.0)
Acid-base deficit: 9 mmol/L — ABNORMAL HIGH (ref 0.0–2.0)
Bicarbonate: 18.7 mmol/L — ABNORMAL LOW (ref 20.0–28.0)
Bicarbonate: 19.9 mmol/L — ABNORMAL LOW (ref 20.0–28.0)
Bicarbonate: 20.3 mmol/L (ref 20.0–28.0)
Bicarbonate: 20.7 mmol/L (ref 20.0–28.0)
Calcium, Ion: 0.89 mmol/L — CL (ref 1.15–1.40)
Calcium, Ion: 0.89 mmol/L — CL (ref 1.15–1.40)
Calcium, Ion: 0.92 mmol/L — ABNORMAL LOW (ref 1.15–1.40)
Calcium, Ion: 1.03 mmol/L — ABNORMAL LOW (ref 1.15–1.40)
HCT: 35 % — ABNORMAL LOW (ref 39.0–52.0)
HCT: 35 % — ABNORMAL LOW (ref 39.0–52.0)
HCT: 36 % — ABNORMAL LOW (ref 39.0–52.0)
HCT: 36 % — ABNORMAL LOW (ref 39.0–52.0)
Hemoglobin: 11.9 g/dL — ABNORMAL LOW (ref 13.0–17.0)
Hemoglobin: 11.9 g/dL — ABNORMAL LOW (ref 13.0–17.0)
Hemoglobin: 12.2 g/dL — ABNORMAL LOW (ref 13.0–17.0)
Hemoglobin: 12.2 g/dL — ABNORMAL LOW (ref 13.0–17.0)
O2 Saturation: 91 %
O2 Saturation: 92 %
O2 Saturation: 94 %
O2 Saturation: 97 %
Patient temperature: 102.3
Patient temperature: 97.21
Patient temperature: 97.8
Potassium: 4.4 mmol/L (ref 3.5–5.1)
Potassium: 4.8 mmol/L (ref 3.5–5.1)
Potassium: 6.1 mmol/L — ABNORMAL HIGH (ref 3.5–5.1)
Potassium: 6.7 mmol/L (ref 3.5–5.1)
Sodium: 137 mmol/L (ref 135–145)
Sodium: 138 mmol/L (ref 135–145)
Sodium: 140 mmol/L (ref 135–145)
Sodium: 141 mmol/L (ref 135–145)
TCO2: 20 mmol/L — ABNORMAL LOW (ref 22–32)
TCO2: 21 mmol/L — ABNORMAL LOW (ref 22–32)
TCO2: 22 mmol/L (ref 22–32)
TCO2: 22 mmol/L (ref 22–32)
pCO2 arterial: 44.9 mmHg (ref 32.0–48.0)
pCO2 arterial: 47.5 mmHg (ref 32.0–48.0)
pCO2 arterial: 48.9 mmHg — ABNORMAL HIGH (ref 32.0–48.0)
pCO2 arterial: 56.4 mmHg — ABNORMAL HIGH (ref 32.0–48.0)
pH, Arterial: 7.176 — CL (ref 7.350–7.450)
pH, Arterial: 7.226 — ABNORMAL LOW (ref 7.350–7.450)
pH, Arterial: 7.229 — ABNORMAL LOW (ref 7.350–7.450)
pH, Arterial: 7.23 — ABNORMAL LOW (ref 7.350–7.450)
pO2, Arterial: 107 mmHg (ref 83.0–108.0)
pO2, Arterial: 72 mmHg — ABNORMAL LOW (ref 83.0–108.0)
pO2, Arterial: 84 mmHg (ref 83.0–108.0)
pO2, Arterial: 91 mmHg (ref 83.0–108.0)

## 2019-02-24 LAB — GLUCOSE, CAPILLARY
Glucose-Capillary: 150 mg/dL — ABNORMAL HIGH (ref 70–99)
Glucose-Capillary: 214 mg/dL — ABNORMAL HIGH (ref 70–99)
Glucose-Capillary: 236 mg/dL — ABNORMAL HIGH (ref 70–99)
Glucose-Capillary: 243 mg/dL — ABNORMAL HIGH (ref 70–99)
Glucose-Capillary: 249 mg/dL — ABNORMAL HIGH (ref 70–99)
Glucose-Capillary: 251 mg/dL — ABNORMAL HIGH (ref 70–99)
Glucose-Capillary: 256 mg/dL — ABNORMAL HIGH (ref 70–99)
Glucose-Capillary: 305 mg/dL — ABNORMAL HIGH (ref 70–99)

## 2019-02-24 LAB — LEGIONELLA PNEUMOPHILA SEROGP 1 UR AG: L. pneumophila Serogp 1 Ur Ag: NEGATIVE

## 2019-02-24 LAB — CBC WITH DIFFERENTIAL/PLATELET
Abs Immature Granulocytes: 0.2 10*3/uL — ABNORMAL HIGH (ref 0.00–0.07)
Basophils Absolute: 0 10*3/uL (ref 0.0–0.1)
Basophils Relative: 0 %
Eosinophils Absolute: 0 10*3/uL (ref 0.0–0.5)
Eosinophils Relative: 0 %
HCT: 40.7 % (ref 39.0–52.0)
Hemoglobin: 13.1 g/dL (ref 13.0–17.0)
Immature Granulocytes: 1 %
Lymphocytes Relative: 13 %
Lymphs Abs: 2.1 10*3/uL (ref 0.7–4.0)
MCH: 29.6 pg (ref 26.0–34.0)
MCHC: 32.2 g/dL (ref 30.0–36.0)
MCV: 92.1 fL (ref 80.0–100.0)
Monocytes Absolute: 0.2 10*3/uL (ref 0.1–1.0)
Monocytes Relative: 2 %
Neutro Abs: 13.1 10*3/uL — ABNORMAL HIGH (ref 1.7–7.7)
Neutrophils Relative %: 84 %
Platelets: 152 10*3/uL (ref 150–400)
RBC: 4.42 MIL/uL (ref 4.22–5.81)
RDW: 14 % (ref 11.5–15.5)
WBC: 15.7 10*3/uL — ABNORMAL HIGH (ref 4.0–10.5)
nRBC: 0.1 % (ref 0.0–0.2)

## 2019-02-24 LAB — HEPARIN LEVEL (UNFRACTIONATED): Heparin Unfractionated: 0.38 IU/mL (ref 0.30–0.70)

## 2019-02-24 LAB — COOXEMETRY PANEL
Carboxyhemoglobin: 0.8 % (ref 0.5–1.5)
Methemoglobin: 2.3 % — ABNORMAL HIGH (ref 0.0–1.5)
O2 Saturation: 76.9 %
Total hemoglobin: 13.4 g/dL (ref 12.0–16.0)

## 2019-02-24 LAB — URINALYSIS, ROUTINE W REFLEX MICROSCOPIC
Bilirubin Urine: NEGATIVE
Glucose, UA: 50 mg/dL — AB
Ketones, ur: 5 mg/dL — AB
Leukocytes,Ua: NEGATIVE
Nitrite: NEGATIVE
Protein, ur: 100 mg/dL — AB
Specific Gravity, Urine: 1.024 (ref 1.005–1.030)
pH: 5 (ref 5.0–8.0)

## 2019-02-24 LAB — TRIGLYCERIDES: Triglycerides: 437 mg/dL — ABNORMAL HIGH (ref <150)

## 2019-02-24 LAB — PHOSPHORUS: Phosphorus: 5.6 mg/dL — ABNORMAL HIGH (ref 2.5–4.6)

## 2019-02-24 LAB — RENAL FUNCTION PANEL
Albumin: 1.8 g/dL — ABNORMAL LOW (ref 3.5–5.0)
Anion gap: 13 (ref 5–15)
BUN: 36 mg/dL — ABNORMAL HIGH (ref 6–20)
CO2: 19 mmol/L — ABNORMAL LOW (ref 22–32)
Calcium: 6.2 mg/dL — CL (ref 8.9–10.3)
Chloride: 108 mmol/L (ref 98–111)
Creatinine, Ser: 3.9 mg/dL — ABNORMAL HIGH (ref 0.61–1.24)
GFR calc Af Amer: 20 mL/min — ABNORMAL LOW (ref >60)
GFR calc non Af Amer: 17 mL/min — ABNORMAL LOW (ref >60)
Glucose, Bld: 286 mg/dL — ABNORMAL HIGH (ref 70–99)
Phosphorus: 5.6 mg/dL — ABNORMAL HIGH (ref 2.5–4.6)
Potassium: 6 mmol/L — ABNORMAL HIGH (ref 3.5–5.1)
Sodium: 140 mmol/L (ref 135–145)

## 2019-02-24 LAB — POCT ACTIVATED CLOTTING TIME
Activated Clotting Time: 175 seconds
Activated Clotting Time: 175 seconds
Activated Clotting Time: 175 seconds
Activated Clotting Time: 191 seconds

## 2019-02-24 LAB — FERRITIN: Ferritin: 2057 ng/mL — ABNORMAL HIGH (ref 24–336)

## 2019-02-24 LAB — MAGNESIUM: Magnesium: 1.9 mg/dL (ref 1.7–2.4)

## 2019-02-24 LAB — SODIUM, URINE, RANDOM: Sodium, Ur: 18 mmol/L

## 2019-02-24 LAB — CREATININE, URINE, RANDOM: Creatinine, Urine: 190.19 mg/dL

## 2019-02-24 LAB — C-REACTIVE PROTEIN: CRP: 24.8 mg/dL — ABNORMAL HIGH (ref <1.0)

## 2019-02-24 LAB — HIV ANTIBODY (ROUTINE TESTING W REFLEX): HIV Screen 4th Generation wRfx: NONREACTIVE

## 2019-02-24 MED ORDER — HEPARIN SODIUM (PORCINE) 1000 UNIT/ML DIALYSIS
1000.0000 [IU] | INTRAMUSCULAR | Status: DC | PRN
  Administered 2019-03-05 – 2019-03-10 (×3): 2800 [IU] via INTRAVENOUS_CENTRAL
  Administered 2019-03-11: 3000 [IU] via INTRAVENOUS_CENTRAL
  Administered 2019-03-13 – 2019-03-14 (×2): 2800 [IU] via INTRAVENOUS_CENTRAL
  Filled 2019-02-24 (×2): qty 6
  Filled 2019-02-24: qty 3
  Filled 2019-02-24 (×10): qty 6

## 2019-02-24 MED ORDER — CALCIUM GLUCONATE-NACL 1-0.675 GM/50ML-% IV SOLN
1.0000 g | Freq: Once | INTRAVENOUS | Status: AC
  Administered 2019-02-24: 1000 mg via INTRAVENOUS
  Filled 2019-02-24: qty 50

## 2019-02-24 MED ORDER — METHYLPREDNISOLONE SODIUM SUCC 125 MG IJ SOLR
80.0000 mg | Freq: Three times a day (TID) | INTRAMUSCULAR | Status: DC
  Filled 2019-02-24: qty 2

## 2019-02-24 MED ORDER — HEPARIN (PORCINE) 25000 UT/250ML-% IV SOLN
700.0000 [IU]/h | INTRAVENOUS | Status: DC
  Administered 2019-02-24: 650 [IU]/h via INTRAVENOUS
  Administered 2019-02-25: 550 [IU]/h via INTRAVENOUS
  Administered 2019-02-27: 1250 [IU]/h via INTRAVENOUS
  Administered 2019-02-27: 1150 [IU]/h via INTRAVENOUS
  Administered 2019-02-28 – 2019-03-01 (×2): 1250 [IU]/h via INTRAVENOUS
  Administered 2019-03-02: 18:00:00 550 [IU]/h via INTRAVENOUS
  Administered 2019-03-04 – 2019-03-07 (×3): 900 [IU]/h via INTRAVENOUS
  Administered 2019-03-08: 700 [IU]/h via INTRAVENOUS
  Filled 2019-02-24 (×13): qty 250

## 2019-02-24 MED ORDER — INSULIN GLARGINE 100 UNIT/ML ~~LOC~~ SOLN
5.0000 [IU] | Freq: Every day | SUBCUTANEOUS | Status: DC
  Administered 2019-02-24 – 2019-02-25 (×2): 5 [IU] via SUBCUTANEOUS
  Filled 2019-02-24 (×4): qty 0.05

## 2019-02-24 MED ORDER — SODIUM BICARBONATE 8.4 % IV SOLN
INTRAVENOUS | Status: AC
  Administered 2019-02-24: 01:00:00
  Filled 2019-02-24: qty 50

## 2019-02-24 MED ORDER — CLONAZEPAM 1 MG PO TABS
1.0000 mg | ORAL_TABLET | Freq: Four times a day (QID) | ORAL | Status: DC
  Administered 2019-02-24 – 2019-02-28 (×16): 1 mg
  Filled 2019-02-24 (×16): qty 1

## 2019-02-24 MED ORDER — PRISMASOL BGK 4/2.5 32-4-2.5 MEQ/L REPLACEMENT SOLN
Status: DC
  Administered 2019-02-24 – 2019-03-14 (×55): via INTRAVENOUS_CENTRAL
  Filled 2019-02-24 (×51): qty 5000

## 2019-02-24 MED ORDER — INSULIN ASPART 100 UNIT/ML ~~LOC~~ SOLN
0.0000 [IU] | SUBCUTANEOUS | Status: DC
  Administered 2019-02-24: 8 [IU] via SUBCUTANEOUS
  Administered 2019-02-24: 5 [IU] via SUBCUTANEOUS
  Administered 2019-02-24: 11 [IU] via SUBCUTANEOUS
  Administered 2019-02-24: 5 [IU] via SUBCUTANEOUS
  Administered 2019-02-24: 2 [IU] via SUBCUTANEOUS
  Administered 2019-02-25 (×2): 3 [IU] via SUBCUTANEOUS
  Administered 2019-02-25: 2 [IU] via SUBCUTANEOUS
  Administered 2019-02-25 (×2): 3 [IU] via SUBCUTANEOUS
  Administered 2019-02-26: 5 [IU] via SUBCUTANEOUS
  Administered 2019-02-26: 2 [IU] via SUBCUTANEOUS
  Administered 2019-02-26 – 2019-02-27 (×5): 3 [IU] via SUBCUTANEOUS
  Administered 2019-02-27 – 2019-02-28 (×3): 2 [IU] via SUBCUTANEOUS
  Administered 2019-03-01 (×2): 3 [IU] via SUBCUTANEOUS
  Administered 2019-03-01 (×2): 2 [IU] via SUBCUTANEOUS
  Administered 2019-03-01 – 2019-03-02 (×2): 5 [IU] via SUBCUTANEOUS
  Administered 2019-03-02: 20:00:00 0 [IU] via SUBCUTANEOUS
  Administered 2019-03-02 – 2019-03-03 (×6): 2 [IU] via SUBCUTANEOUS
  Administered 2019-03-03 (×2): 3 [IU] via SUBCUTANEOUS
  Administered 2019-03-03: 2 [IU] via SUBCUTANEOUS
  Administered 2019-03-04 (×5): 3 [IU] via SUBCUTANEOUS
  Administered 2019-03-05 (×2): 2 [IU] via SUBCUTANEOUS
  Administered 2019-03-05: 20:00:00 5 [IU] via SUBCUTANEOUS
  Administered 2019-03-05: 2 [IU] via SUBCUTANEOUS
  Administered 2019-03-05 – 2019-03-06 (×7): 3 [IU] via SUBCUTANEOUS
  Administered 2019-03-06: 20:00:00 2 [IU] via SUBCUTANEOUS
  Administered 2019-03-07 (×5): 3 [IU] via SUBCUTANEOUS
  Administered 2019-03-08: 05:00:00 2 [IU] via SUBCUTANEOUS
  Administered 2019-03-08 (×2): 3 [IU] via SUBCUTANEOUS
  Administered 2019-03-08 (×3): 2 [IU] via SUBCUTANEOUS
  Administered 2019-03-09: 3 [IU] via SUBCUTANEOUS
  Administered 2019-03-09 (×3): 2 [IU] via SUBCUTANEOUS
  Administered 2019-03-10: 20:00:00 3 [IU] via SUBCUTANEOUS
  Administered 2019-03-10: 2 [IU] via SUBCUTANEOUS
  Administered 2019-03-10: 3 [IU] via SUBCUTANEOUS
  Administered 2019-03-10: 2 [IU] via SUBCUTANEOUS
  Administered 2019-03-10 – 2019-03-11 (×3): 3 [IU] via SUBCUTANEOUS
  Administered 2019-03-11 (×2): 2 [IU] via SUBCUTANEOUS
  Administered 2019-03-11: 3 [IU] via SUBCUTANEOUS
  Administered 2019-03-11: 2 [IU] via SUBCUTANEOUS
  Administered 2019-03-12 (×2): 3 [IU] via SUBCUTANEOUS
  Administered 2019-03-12: 5 [IU] via SUBCUTANEOUS
  Administered 2019-03-12 (×2): 3 [IU] via SUBCUTANEOUS
  Administered 2019-03-12: 2 [IU] via SUBCUTANEOUS
  Administered 2019-03-13: 09:00:00 3 [IU] via SUBCUTANEOUS
  Administered 2019-03-13: 12:00:00 2 [IU] via SUBCUTANEOUS
  Administered 2019-03-13: 04:00:00 3 [IU] via SUBCUTANEOUS
  Administered 2019-03-13: 20:00:00 5 [IU] via SUBCUTANEOUS
  Administered 2019-03-14 (×2): 3 [IU] via SUBCUTANEOUS
  Administered 2019-03-14: 20:00:00 2 [IU] via SUBCUTANEOUS
  Administered 2019-03-14 (×3): 3 [IU] via SUBCUTANEOUS
  Administered 2019-03-14: 8 [IU] via SUBCUTANEOUS
  Administered 2019-03-15 – 2019-03-16 (×4): 2 [IU] via SUBCUTANEOUS
  Administered 2019-03-16: 3 [IU] via SUBCUTANEOUS
  Administered 2019-03-16 (×3): 2 [IU] via SUBCUTANEOUS
  Administered 2019-03-17 (×2): 3 [IU] via SUBCUTANEOUS
  Administered 2019-03-17 (×3): 2 [IU] via SUBCUTANEOUS
  Administered 2019-03-17: 13:00:00 3 [IU] via SUBCUTANEOUS
  Administered 2019-03-18: 04:00:00 2 [IU] via SUBCUTANEOUS
  Administered 2019-03-18: 23:00:00 3 [IU] via SUBCUTANEOUS
  Administered 2019-03-18: 16:00:00 2 [IU] via SUBCUTANEOUS
  Administered 2019-03-18 – 2019-03-19 (×5): 3 [IU] via SUBCUTANEOUS
  Administered 2019-03-19: 12:00:00 5 [IU] via SUBCUTANEOUS
  Administered 2019-03-20: 03:00:00 3 [IU] via SUBCUTANEOUS
  Administered 2019-03-20: 09:00:00 2 [IU] via SUBCUTANEOUS
  Administered 2019-03-20: 5 [IU] via SUBCUTANEOUS
  Administered 2019-03-20: 12:00:00 3 [IU] via SUBCUTANEOUS
  Administered 2019-03-20: 17:00:00 5 [IU] via SUBCUTANEOUS
  Administered 2019-03-20: 21:00:00 2 [IU] via SUBCUTANEOUS
  Administered 2019-03-21: 01:00:00 5 [IU] via SUBCUTANEOUS
  Administered 2019-03-21 (×3): 2 [IU] via SUBCUTANEOUS
  Administered 2019-03-21: 12:00:00 5 [IU] via SUBCUTANEOUS
  Administered 2019-03-22: 08:00:00 2 [IU] via SUBCUTANEOUS
  Administered 2019-03-22: 01:00:00 5 [IU] via SUBCUTANEOUS
  Administered 2019-03-22: 05:00:00 2 [IU] via SUBCUTANEOUS
  Administered 2019-03-22: 21:00:00 3 [IU] via SUBCUTANEOUS
  Administered 2019-03-22: 11:00:00 2 [IU] via SUBCUTANEOUS
  Administered 2019-03-23: 04:00:00 5 [IU] via SUBCUTANEOUS
  Administered 2019-03-23 (×2): 3 [IU] via SUBCUTANEOUS
  Administered 2019-03-23: 13:00:00 5 [IU] via SUBCUTANEOUS
  Administered 2019-03-23: 17:00:00 2 [IU] via SUBCUTANEOUS
  Administered 2019-03-23: 3 [IU] via SUBCUTANEOUS
  Administered 2019-03-24: 12:00:00 5 [IU] via SUBCUTANEOUS
  Administered 2019-03-24 (×2): 2 [IU] via SUBCUTANEOUS
  Administered 2019-03-24: 08:00:00 3 [IU] via SUBCUTANEOUS
  Administered 2019-03-24: 5 [IU] via SUBCUTANEOUS
  Administered 2019-03-25: 3 [IU] via SUBCUTANEOUS
  Administered 2019-03-25 – 2019-03-26 (×5): 2 [IU] via SUBCUTANEOUS
  Administered 2019-03-26: 3 [IU] via SUBCUTANEOUS
  Administered 2019-03-27 – 2019-03-29 (×12): 2 [IU] via SUBCUTANEOUS
  Administered 2019-03-29: 3 [IU] via SUBCUTANEOUS
  Administered 2019-03-29: 05:00:00 2 [IU] via SUBCUTANEOUS
  Administered 2019-03-29: 16:00:00 3 [IU] via SUBCUTANEOUS
  Administered 2019-03-30 – 2019-04-01 (×3): 2 [IU] via SUBCUTANEOUS
  Administered 2019-04-01 – 2019-04-02 (×3): 3 [IU] via SUBCUTANEOUS
  Administered 2019-04-02: 2 [IU] via SUBCUTANEOUS
  Administered 2019-04-03: 3 [IU] via SUBCUTANEOUS
  Administered 2019-04-03 – 2019-04-04 (×3): 2 [IU] via SUBCUTANEOUS
  Administered 2019-04-04 (×2): 3 [IU] via SUBCUTANEOUS
  Administered 2019-04-05 (×2): 5 [IU] via SUBCUTANEOUS
  Administered 2019-04-05 (×2): 2 [IU] via SUBCUTANEOUS
  Administered 2019-04-06: 3 [IU] via SUBCUTANEOUS
  Administered 2019-04-06: 2 [IU] via SUBCUTANEOUS
  Administered 2019-04-06: 5 [IU] via SUBCUTANEOUS
  Administered 2019-04-06 (×2): 3 [IU] via SUBCUTANEOUS
  Administered 2019-04-07 (×2): 2 [IU] via SUBCUTANEOUS
  Administered 2019-04-07 (×3): 3 [IU] via SUBCUTANEOUS
  Administered 2019-04-08: 8 [IU] via SUBCUTANEOUS
  Administered 2019-04-08: 3 [IU] via SUBCUTANEOUS
  Administered 2019-04-08 (×2): 5 [IU] via SUBCUTANEOUS

## 2019-02-24 MED ORDER — PRISMASOL BGK 4/2.5 32-4-2.5 MEQ/L IV SOLN
INTRAVENOUS | Status: DC
  Administered 2019-02-24 – 2019-03-03 (×53): via INTRAVENOUS_CENTRAL
  Filled 2019-02-24 (×71): qty 5000

## 2019-02-24 MED ORDER — INSULIN ASPART 100 UNIT/ML ~~LOC~~ SOLN
6.0000 [IU] | SUBCUTANEOUS | Status: DC
  Administered 2019-02-24 – 2019-03-13 (×89): 6 [IU] via SUBCUTANEOUS

## 2019-02-24 MED ORDER — HYDROCORTISONE NA SUCCINATE PF 100 MG IJ SOLR
100.0000 mg | Freq: Three times a day (TID) | INTRAMUSCULAR | Status: DC
  Administered 2019-02-24 – 2019-02-27 (×10): 100 mg via INTRAVENOUS
  Filled 2019-02-24 (×10): qty 2

## 2019-02-24 MED ORDER — DEXMEDETOMIDINE HCL IN NACL 400 MCG/100ML IV SOLN
0.4000 ug/kg/h | INTRAVENOUS | Status: AC
  Administered 2019-02-24: 0.4 ug/kg/h via INTRAVENOUS
  Administered 2019-02-24 – 2019-02-25 (×4): 1.2 ug/kg/h via INTRAVENOUS
  Filled 2019-02-24 (×5): qty 100
  Filled 2019-02-24: qty 200

## 2019-02-24 MED ORDER — METHYLPREDNISOLONE SODIUM SUCC 125 MG IJ SOLR: 40.0000 mg | Freq: Three times a day (TID) | INTRAMUSCULAR | Status: DC

## 2019-02-24 MED ORDER — CHLORHEXIDINE GLUCONATE 0.12% ORAL RINSE (MEDLINE KIT)
15.0000 mL | Freq: Two times a day (BID) | OROMUCOSAL | Status: DC
  Administered 2019-02-24 – 2019-03-15 (×39): 15 mL via OROMUCOSAL

## 2019-02-24 MED ORDER — TOCILIZUMAB 400 MG/20ML IV SOLN
8.0000 mg/kg | Freq: Once | INTRAVENOUS | Status: AC
  Administered 2019-02-24: 660 mg via INTRAVENOUS
  Filled 2019-02-24: qty 33

## 2019-02-24 MED ORDER — NOREPINEPHRINE 16 MG/250ML-% IV SOLN: 0.0000 ug/min | INTRAVENOUS | Status: DC

## 2019-02-24 MED ORDER — FAMOTIDINE 40 MG/5ML PO SUSR
20.0000 mg | Freq: Every day | ORAL | Status: DC
  Administered 2019-02-25 – 2019-04-01 (×34): 20 mg
  Filled 2019-02-24 (×38): qty 2.5

## 2019-02-24 MED ORDER — CALCIUM GLUCONATE-NACL 1-0.675 GM/50ML-% IV SOLN
1.0000 g | Freq: Once | INTRAVENOUS | Status: AC
  Administered 2019-02-24: 11:00:00 1000 mg via INTRAVENOUS
  Filled 2019-02-24: qty 50

## 2019-02-24 MED ORDER — ORAL CARE MOUTH RINSE
15.0000 mL | OROMUCOSAL | Status: DC
  Administered 2019-02-24 – 2019-03-28 (×306): 15 mL via OROMUCOSAL

## 2019-02-24 MED ORDER — DOBUTAMINE IN D5W 4-5 MG/ML-% IV SOLN
0.5000 ug/kg/min | INTRAVENOUS | Status: DC
  Administered 2019-02-24: 2.5 ug/kg/min via INTRAVENOUS
  Administered 2019-02-27: 0.5 ug/kg/min via INTRAVENOUS
  Filled 2019-02-24 (×2): qty 250

## 2019-02-24 MED ORDER — PRISMASOL BGK 4/2.5 32-4-2.5 MEQ/L REPLACEMENT SOLN
Status: DC
  Administered 2019-02-24 – 2019-03-13 (×26): via INTRAVENOUS_CENTRAL
  Filled 2019-02-24 (×22): qty 5000

## 2019-02-24 NOTE — Procedures (Signed)
Hemodialysis Catheter Insertion Procedure Note Jonathan [name] [id number] 1971-06-02  Procedure: Insertion of Hemodialysis Catheter Indications: Dialysis Access   Procedure Details Consent: Unable to obtain consent because of emergent medical necessity. Time Out: Verified patient identification, verified procedure, site/side was marked, verified correct patient position, special equipment/implants available, medications/allergies/relevent history reviewed, required imaging and test results available.  Performed  Maximum sterile technique was used including antiseptics, cap, gloves, gown, hand hygiene, mask and sheet. Skin prep: Chlorhexidine; local anesthetic administered Triple lumen hemodialysis catheter was inserted into left internal jugular vein using the Seldinger technique.  Evaluation Blood flow good Complications: No apparent complications Patient did tolerate procedure well. Chest X-ray ordered to verify placement.  CXR: pending.  Hayden Pedro, AGACNP-BC  Pulmonary & Critical Care  PCCM Pgr: [phone]

## 2019-02-24 NOTE — Progress Notes (Signed)
Pt. successfully proned with MD, RT and multiple RNs.

## 2019-02-24 NOTE — Progress Notes (Signed)
ANTICOAGULATION CONSULT NOTE - Initial Consult  Pharmacy Consult per for COVID with elevated d-dimer   Indication: VTE prophylaxis  Patient Measurements: Height: 5\' 5"  (165.1 cm) Weight: 181 lb 14.1 oz (82.5 kg) IBW/kg (Calculated) : 61.5  Vital Signs: Temp: 100.1 F (37.8 C) (04/22 1030) Temp Source: Oral (04/22 1030) BP: 88/67 (04/22 1000) Pulse Rate: 103 (04/22 1010)  Labs: Recent Labs    02/23/19 0946  02/23/19 1215  02/24/19 0013 02/24/19 0054 02/24/19 0505  HGB 14.7   < > 14.7   < > 12.2* 13.1 11.9*  HCT 44.3   < > 44.0   < > 36.0* 40.7 35.0*  PLT 192  --  166  --   --  152  --   CREATININE 0.95  --  1.10  --   --  2.83*  --    < > = values in this interval not displayed.    Estimated Creatinine Clearance: 31.6 mL/min (A) (by C-G formula based on SCr of 2.83 mg/dL (H)).   Medical History: No past medical history on file.  Assessment: 48 yo M admitted with respiratory distress, cough and AMS found to be COVID+. D-dimers elevated on 4/21 at 11.18. Patient's CrCl ~30 with plans to start CRRT today. Pharmacy consulted to start heparin drip due concerns for increased coagulopathy with elevated d-dimer and COVID-19 diagnosis.   D-dimer 11.18, Hgb 11.9, Plts 152  Goal of Therapy:  Heparin level 0.1-0.25 Monitor platelets by anticoagulation protocol: Yes   Plan:  Start heparin infusion at 650 Check heparin level in 6 hours  Monitor daily heparin levels, CBCs and s/sx of bleeding  Gwenlyn Found, Sherian Rein D PGY1 Pharmacy Resident  Phone [phone] 02/24/2019   10:45 AM

## 2019-02-24 NOTE — Progress Notes (Signed)
CRITICAL VALUE ALERT  Critical Value: Calcium 6.2  Date & Time Notied: 02/24/2019 1830  Provider Notified: Johnney Ou  Orders Received/Actions taken: Orders to be placed.

## 2019-02-24 NOTE — Progress Notes (Addendum)
NAME:  Jonathan [name], MRN:  [id number], DOB:  [birth year], LOS: 1 ADMISSION DATE:  02/23/2019, CONSULTATION DATE:  02/23/2019 REFERRING MD:  [facility], CHIEF COMPLAINT:  Respiratory failure   Brief History   48 year old man intubated for severe respiratory failure requiring prone ventilation and nitric oxide for ARDS related to COVID-19.  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  Past Medical History  Limited information. T2DM recently diagnosed on OHA.   Significant Hospital Events   4/21 admitted and immediately proned on inverse ratio PC ventilation with high PEEP to maintain saturation >85%, Started on NO at [place]:  PCCM 4/21  Procedures:  4/21 - ETT 7.5,  4/21 - RIJ [facility]  Significant Diagnostic Tests:  POC U/S 4/21: Bilateral interstitial pattern with bilateral dense consolidation at bases. Echo shows low-normal LVSF. RV moderately dilated with septal shift.   Micro Data:  Blood cultures -PND SARS-CoV2 - POSITIVE   Antimicrobials:  Azithromycin 4/21 Ceftriaxone 4/21 Hydroxychloroquine 4/21 > 4/22  Interim history/subjective:  Continues on ventilation.  Inhaler now at 40 On levo and vaso.  Very low urine output.  Objective   Blood pressure 109/80, pulse (!) 104, temperature 98 F (36.7 C), temperature source Oral, resp. rate (!) 32, height 5\' 5"  (1.651 m), weight 82.5 kg, SpO2 97 %. CVP:  [18 mmHg] 18 mmHg  Vent Mode: PRVC FiO2 (%):  [60 %-100 %] 60 % Set Rate:  [24 bmp-32 bmp] 32 bmp Vt Set:  [350 mL-370 mL] 360 mL PEEP:  [15 cmH20-24 cmH20] 20 cmH20 Plateau Pressure:  [25 cmH20-37 cmH20] 34 cmH20   Intake/Output Summary (Last 24 hours) at 02/24/2019 0847 Last data filed at 02/24/2019 0700 Gross per 24 hour  Intake 4444.1 ml  Output 520 ml  Net 3924.1 ml   Filed Weights   02/23/19 1300 02/24/19 0400  Weight: 77.7 kg 82.5  kg   Examination: Gen:      No acute distress HEENT:  EOMI, sclera anicteric, ET tube Neck:     No masses; no thyromegaly Lungs:    Clear to auscultation bilaterally; normal respiratory effort CV:         Regular rate and rhythm; no murmurs Abd:      + bowel sounds; soft, non-tender; no palpable masses, no distension Ext:    No edema; adequate peripheral perfusion Skin:      Warm and dry; no rash Neuro: sedated, paralyzed  Resolved Hospital Problem list   No   Assessment & Plan:  48 year old with severe ARDS, multiorgan failure due to COVID  Resp failure We will lay supine and recheck ABG. Continue inhaled NO Stop chloroquine as it not recommended with azithromycin Give 1 dose actemra  Shock On  Levophed and vaso Add stress dose steroids  Diabetes mellitus Start Lantus for tight glucose control  AKI Monitor urine output Consult nephrology.  Will likely need CVVH  Best practice:  Diet: trickle feed only while prone Pain/Anxiety/Delirium protocol (if indicated): Fentanyl, Propofol gtts, intermittent lorazepam. Target BIS <40. Start topical fentanyl, clonazepam and quetiapine. VAP protocol (if indicated): Bundle in place. DVT prophylaxis: UFH. Weight based dosing 0.5 mg/kg GI prophylaxis: Famotidine Glucose control: Lantus, SSI coverage. Mobility: Bedrest Code Status: Full Family Communication: Family in [place]. Was able to talk to wife and daughter through interpreter.  Have communicated with employer. Disposition: ICU.  Labs   CBC:  Recent Labs  Lab 02/23/19 0946  02/23/19 1215 02/23/19 1946 02/24/19 0013 02/24/19 0054 02/24/19 0505  WBC 10.1  --  8.2  --   --  15.7*  --   NEUTROABS 8.5*  --   --   --   --  13.1*  --   HGB 14.7   < > 14.7 12.2* 12.2* 13.1 11.9*  HCT 44.3   < > 44.0 36.0* 36.0* 40.7 35.0*  MCV 88.4  --  89.1  --   --  92.1  --   PLT 192  --  166  --   --  152  --    < > = values in this interval not displayed.    Basic Metabolic Panel:  Recent Labs  Lab 02/23/19 0946 02/23/19 1004 02/23/19 1215 02/23/19 1946 02/24/19 0013 02/24/19 0054 02/24/19 0505  NA 137 138  --  139 141 140 140  K 4.5 4.0  --  4.0 4.4 4.5 4.8  CL 105  --   --   --   --  107  --   CO2 17*  --   --   --   --  19*  --   GLUCOSE 199*  --   --   --   --  273*  --   BUN 13  --   --   --   --  26*  --   CREATININE 0.95  --  1.10  --   --  2.83*  --   CALCIUM 7.8*  --   --   --   --  6.6*  --   MG  --   --   --   --   --  1.9  --   PHOS  --   --   --   --   --  5.6*  --    GFR: Estimated Creatinine Clearance: 31.6 mL/min (A) (by C-G formula based on SCr of 2.83 mg/dL (H)). Recent Labs  Lab 02/23/19 0946 02/23/19 1215 02/24/19 0054  PROCALCITON 4.53  --   --   WBC 10.1 8.2 15.7*  LATICACIDVEN  --  1.5  --     Liver Function Tests: Recent Labs  Lab 02/23/19 0946 02/24/19 0054  AST 204* 176*  ALT 125* 100*  ALKPHOS 83 56  BILITOT 1.2 0.9  PROT 6.5 5.2*  ALBUMIN 2.5* 2.0*   No results for input(s): LIPASE, AMYLASE in the last 168 hours. No results for input(s): AMMONIA in the last 168 hours.  ABG    Component Value Date/Time   PHART 7.230 (L) 02/24/2019 0505   PCO2ART 47.5 02/24/2019 0505   PO2ART 84.0 02/24/2019 0505   HCO3 19.9 (L) 02/24/2019 0505   TCO2 21 (L) 02/24/2019 0505   ACIDBASEDEF 8.0 (H) 02/24/2019 0505   O2SAT 94.0 02/24/2019 0505     Coagulation Profile: No results for input(s): INR, PROTIME in the last 168 hours.  Cardiac Enzymes: No results for input(s): CKTOTAL, CKMB, CKMBINDEX, TROPONINI in the last 168 hours.  HbA1C: No results found for: HGBA1C  CBG: Recent Labs  Lab 02/23/19 1402 02/23/19 2002 02/23/19 2321 02/24/19 0357  GLUCAP 188* 243* 236* 251*   The patient is critically ill with multiple organ system failure and requires high complexity decision making for assessment and support, frequent evaluation and titration of therapies, advanced monitoring, review of radiographic studies and  interpretation of complex data.   Critical Care Time devoted to patient care services,  exclusive of separately billable procedures, described in this note is 35 minutes.   Marshell Garfinkel MD  Pulmonary and Critical Care Pager [phone] If no answer call [phone] 02/24/2019, 8:47 AM

## 2019-02-24 NOTE — Progress Notes (Signed)
ANTICOAGULATION CONSULT NOTE  Pharmacy Consult per for COVID with elevated d-dimer   Indication: VTE prophylaxis  Patient Measurements: Height: 5\' 5"  (165.1 cm) Weight: 181 lb 14.1 oz (82.5 kg) IBW/kg (Calculated) : 61.5  Vital Signs: Temp: 96.5 F (35.8 C) (04/22 2000) Temp Source: Rectal (04/22 2000) BP: 123/90 (04/22 2000) Pulse Rate: 68 (04/22 2009)  Labs: Recent Labs    02/23/19 0946  02/23/19 1215  02/24/19 0054 02/24/19 0505 02/24/19 1342 02/24/19 1700 02/24/19 1805 02/24/19 2038  HGB 14.7   < > 14.7   < > 13.1 11.9* 12.2*  --  11.9*  --   HCT 44.3   < > 44.0   < > 40.7 35.0* 36.0*  --  35.0*  --   PLT 192  --  166  --  152  --   --   --   --   --   HEPARINUNFRC  --   --   --   --   --   --   --   --   --  0.38  CREATININE 0.95  --  1.10  --  2.83*  --   --  3.90*  --   --    < > = values in this interval not displayed.    Estimated Creatinine Clearance: 22.9 mL/min (A) (by C-G formula based on SCr of 3.9 mg/dL (H)).   Medical History: No past medical history on file.  Assessment: 48 yo M admitted with respiratory distress, cough and AMS found to be COVID+. D-dimers elevated on 4/21 at 11.18. Patient's CrCl ~30 with plans to start CRRT today. Pharmacy consulted to dose heparin drip due concerns for increased coagulopathy with elevated d-dimer and COVID-19 diagnosis.   Heparin level is supra-therapeutic, possibly due to Lovenox this AM.  No bleeding reported.   Goal of Therapy:  Heparin level 0.1-0.25 units/mL Monitor platelets by anticoagulation protocol: Yes   Plan:  Reduce heparin gtt to 550 units/hr F/U AM labs  Pallie Swigert D. Mina Marble, PharmD, BCPS, [facility] 02/24/2019, 9:10 PM

## 2019-02-24 NOTE — Progress Notes (Signed)
[facility] Progress Note Patient Name: Jonathan [name] DOB: 1971-01-26 MRN: [id number]   Date of Service  02/24/2019  HPI/Events of Note  Notified of elevated triglycerides. Discussed with bedside nurse and patient still adequately sedated despite coming down on propofol.  eICU Interventions   Continue to titrate down propofol to off  No need to start alternative sedation for now     Intervention Category Intermediate Interventions: Other:  Judd Lien 02/24/2019, 2:49 AM

## 2019-02-24 NOTE — Consult Note (Signed)
[facility] Renal Consultation Note  Requesting MD: Mannam Indication for Consultation:  AKI   Chief complaint: shortness of breath   HPI:  Jonathan [name] is a 48 y.o. male with a history of recently diagnosed diabetes who presented to the hospital with respiratory distress and altered mental status.  He was intubated and proned and found to be COVID-19 positive.  He has been anuric.  Critical care has consulted requesting CRRT.  His primary team has spoken with his family in [place] via an interpreter and they have indicated that they would want aggressive measures such as dialysis.  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  He has been on levophed and vasopressin with downtrending levophed requirements; previously maxed on levo.  Dobutamine is being added per nursing report.  Due to the nature of this patient's suspected COVID-19 with isolation and in keeping with efforts to prevent the spread of infection and to conserve personal protective equipment, a physical exam was not personally performed.  Patient's symptoms and exam were discussed in detail with the RN.  A chart review of other providers notes and the patient's lab work as well as review of other pertinent studies was performed.  Exam details from prior documentation were reviewed specifically and confirmed with the bedside nurse.  Location of service: [facility]    Creatinine, Ser  Date/Time Value Ref Range Status  02/24/2019 12:54 AM 2.83 (H) 0.61 - 1.24 mg/dL Final    Comment:    DELTA CHECK NOTED  02/23/2019 12:15 PM 1.10 0.61 - 1.24 mg/dL Final  02/23/2019 09:46 AM 0.95 0.61 - 1.24 mg/dL Final   PMHx:  type 2 DM  Family Hx:  Unable to obtain 2/2 mechanical ventilation, obtunded   Social History:  has no history on file for tobacco, alcohol, and drug. on chart review   Allergies: unable to obtain 2/2 mechanical ventilation   Medications:  I have reviewed the patient's current  medications.  Labs:  BMP Latest Ref Rng & Units 02/24/2019 02/24/2019 02/24/2019  Glucose 70 - 99 mg/dL - 273(H) -  BUN 6 - 20 mg/dL - 26(H) -  Creatinine 0.61 - 1.24 mg/dL - 2.83(H) -  Sodium 135 - 145 mmol/L 140 140 141  Potassium 3.5 - 5.1 mmol/L 4.8 4.5 4.4  Chloride 98 - 111 mmol/L - 107 -  CO2 22 - 32 mmol/L - 19(L) -  Calcium 8.9 - 10.3 mg/dL - 6.6(L) -    Urinalysis    Component Value Date/Time   COLORURINE AMBER (A) 02/24/2019 0054   APPEARANCEUR HAZY (A) 02/24/2019 0054   LABSPEC 1.024 02/24/2019 0054   PHURINE 5.0 02/24/2019 0054   GLUCOSEU 50 (A) 02/24/2019 0054   HGBUR MODERATE (A) 02/24/2019 0054   BILIRUBINUR NEGATIVE 02/24/2019 0054   KETONESUR 5 (A) 02/24/2019 0054   PROTEINUR 100 (A) 02/24/2019 0054   NITRITE NEGATIVE 02/24/2019 0054   LEUKOCYTESUR NEGATIVE 02/24/2019 0054    ROS:  Unable to obtain 2/2 mechanical ventilation   Physical Exam: Vitals:   02/24/19 0800 02/24/19 0850  BP: 109/80 108/80  Pulse: (!) 104 (!) 103  Resp: (!) 32 (!) 32  Temp:    SpO2: 97% 97%     General: adult male intubated and proned  HEENT: face edematous; sluggish per RN exam Heart: tachycardic  Lungs:coarse and diminshed breath sounds per nursing exam  Abdomen: tight and distended per nursing exam; difficult exam 2/2 prone Extremities: 1+ edema per nursing exam  Skin: no rash or lesions  Neuro: sedated  GU: foley catheter   Assessment/Plan:  # AKI  - Secondary to ATN in the setting of shock, COVID19 - Anuric and anticipate continued worsening  - Start CRRT with goal net even for now   # COVID - 19 positive  - per pulmonology  - on hydroxychloroquine   # Acute hypoxic respiratory failure - Intubated and proned  - 2/2 COVID 19 with ARDS - Starting CRRT as above to optimize volume status    # Acute metabolic acidosis - Starting CRRT   # Septic shock - On pressors per critical care    Claudia Desanctis 02/24/2019, 9:29 AM

## 2019-02-24 NOTE — H&P (Deleted)
NAME:  Jonathan [name], MRN:  [id number], DOB:  25-Sep-1971, LOS: 1 ADMISSION DATE:  02/23/2019, CONSULTATION DATE:  02/23/2019 REFERRING MD:  [facility] C[facility] ED, CHIEF COMPLAINT:  Respiratory failure   Brief History   48 year old man intubated for severe respiratory failure requiring prone ventilation and nitric oxide for ARDS related to COVID-19.  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  Past Medical History  Limited information. T2DM recently diagnosed on OHA.   Significant Hospital Events   4/21 admitted and immediately proned on inverse ratio PC ventilation with high PEEP to maintain saturation >85%, Started on NO at [place]:  PCCM 4/21  Procedures:  4/21 - ETT 7.5,  4/21 - RIJ [facility]VC [facility]  Significant Diagnostic Tests:  POC U/S 4/21: Bilateral interstitial pattern with bilateral dense consolidation at bases. Echo shows low-normal LVSF. RV moderately dilated with septal shift.   Micro Data:  Blood cultures -PND SARS-CoV2 - POSITIVE   Antimicrobials:  Azithromycin 4/21 Ceftriaxone 4/21 Hydroxychloroquine 4/21 > 4/22  Interim history/subjective:  Continues on ventilation.  Inhaler now at 40 On levo and vaso.  Very low urine output.  Objective   Blood pressure 109/80, pulse (!) 104, temperature 98 F (36.7 C), temperature source Oral, resp. rate (!) 32, height 5\' 5"  (1.651 m), weight 82.5 kg, SpO2 97 %. CVP:  [18 mmHg] 18 mmHg  Vent Mode: PRVC FiO2 (%):  [60 %-100 %] 60 % Set Rate:  [24 bmp-32 bmp] 32 bmp Vt Set:  [350 mL-370 mL] 360 mL PEEP:  [15 cmH20-24 cmH20] 20 cmH20 Plateau Pressure:  [25 cmH20-37 cmH20] 34 cmH20   Intake/Output Summary (Last 24 hours) at 02/24/2019 0816 Last data filed at 02/24/2019 0700 Gross per 24 hour  Intake 4444.1 ml  Output 520 ml  Net 3924.1 ml   Filed Weights   02/23/19 1300 02/24/19 0400  Weight: 77.7 kg 82.5  kg   Examination: Gen:      No acute distress HEENT:  EOMI, sclera anicteric, ET tube Neck:     No masses; no thyromegaly Lungs:    Clear to auscultation bilaterally; normal respiratory effort CV:         Regular rate and rhythm; no murmurs Abd:      + bowel sounds; soft, non-tender; no palpable masses, no distension Ext:    No edema; adequate peripheral perfusion Skin:      Warm and dry; no rash Neuro: sedated, paralyzed  Resolved Hospital Problem list   No   Assessment & Plan:  48 year old with severe ARDS, multiorgan failure due to COVID  Resp failure We will lay supine and recheck ABG. Continue inhaled NO Stop chloroquine as it not recommended with azithromycin Give 1 dose actemra  Shock On  Levophed and vaso Add stress dose steroids  Diabetes mellitus Start Lantus for tight glucose control  AKI Monitor urine output Consult nephrology.  Will likely need CVVH  Best practice:  Diet: trickle feed only while prone Pain/Anxiety/Delirium protocol (if indicated): Fentanyl, Propofol gtts, intermittent lorazepam. Target BIS <40. Start topical fentanyl, clonazepam and quetiapine. VAP protocol (if indicated): Bundle in place. DVT prophylaxis: UFH. Weight based dosing 0.5 mg/kg GI prophylaxis: Famotidine Glucose control: Lantus, SSI coverage. Mobility: Bedrest Code Status: Full Family Communication: Family in [place]. Have communicated with employer. Disposition: ICU.  Labs   CBC: Recent Labs  Lab 02/23/19 0946  02/23/19 1215 02/23/19 1946  02/24/19 0013 02/24/19 0054 02/24/19 0505  WBC 10.1  --  8.2  --   --  15.7*  --   NEUTROABS 8.5*  --   --   --   --  13.1*  --   HGB 14.7   < > 14.7 12.2* 12.2* 13.1 11.9*  HCT 44.3   < > 44.0 36.0* 36.0* 40.7 35.0*  MCV 88.4  --  89.1  --   --  92.1  --   PLT 192  --  166  --   --  152  --    < > = values in this interval not displayed.    Basic Metabolic Panel: Recent Labs  Lab 02/23/19 0946 02/23/19 1004 02/23/19  1215 02/23/19 1946 02/24/19 0013 02/24/19 0054 02/24/19 0505  NA 137 138  --  139 141 140 140  K 4.5 4.0  --  4.0 4.4 4.5 4.8  CL 105  --   --   --   --  107  --   CO2 17*  --   --   --   --  19*  --   GLUCOSE 199*  --   --   --   --  273*  --   BUN 13  --   --   --   --  26*  --   CREATININE 0.95  --  1.10  --   --  2.83*  --   CALCIUM 7.8*  --   --   --   --  6.6*  --   MG  --   --   --   --   --  1.9  --   PHOS  --   --   --   --   --  5.6*  --    GFR: Estimated Creatinine Clearance: 31.6 mL/min (A) (by C-G formula based on SCr of 2.83 mg/dL (H)). Recent Labs  Lab 02/23/19 0946 02/23/19 1215 02/24/19 0054  PROCALCITON 4.53  --   --   WBC 10.1 8.2 15.7*  LATICACIDVEN  --  1.5  --     Liver Function Tests: Recent Labs  Lab 02/23/19 0946 02/24/19 0054  AST 204* 176*  ALT 125* 100*  ALKPHOS 83 56  BILITOT 1.2 0.9  PROT 6.5 5.2*  ALBUMIN 2.5* 2.0*   No results for input(s): LIPASE, AMYLASE in the last 168 hours. No results for input(s): AMMONIA in the last 168 hours.  ABG    Component Value Date/Time   PHART 7.230 (L) 02/24/2019 0505   PCO2ART 47.5 02/24/2019 0505   PO2ART 84.0 02/24/2019 0505   HCO3 19.9 (L) 02/24/2019 0505   TCO2 21 (L) 02/24/2019 0505   ACIDBASEDEF 8.0 (H) 02/24/2019 0505   O2SAT 94.0 02/24/2019 0505     Coagulation Profile: No results for input(s): INR, PROTIME in the last 168 hours.  Cardiac Enzymes: No results for input(s): CKTOTAL, CKMB, CKMBINDEX, TROPONINI in the last 168 hours.  HbA1C: No results found for: HGBA1C  CBG: Recent Labs  Lab 02/23/19 1402 02/23/19 2002 02/23/19 2321 02/24/19 0357  GLUCAP 188* 243* 236* 251*   The patient is critically ill with multiple organ system failure and requires high complexity decision making for assessment and support, frequent evaluation and titration of therapies, advanced monitoring, review of radiographic studies and interpretation of complex data.   Critical Care Time  devoted to patient care services, exclusive of separately billable procedures, described in this note is 21  minutes.   Marshell Garfinkel MD [facility] Pager [phone] If no answer call [phone] 02/24/2019, 8:17 AM

## 2019-02-24 NOTE — Progress Notes (Signed)
Pt. successfully returned patient to supine position with RT and nursing saff at bedside.

## 2019-02-24 NOTE — Progress Notes (Signed)
Patient successfully re-proned at 1630 from verbal order Dr Vaughan Browner. Will repeat ABG in 1 hour. Will continue to monitor closely.

## 2019-02-24 NOTE — Progress Notes (Signed)
Inpatient Diabetes Program Recommendations  AACE/ADA: New Consensus Statement on Inpatient Glycemic Control (2015)  Target Ranges:  Prepandial:   less than 140 mg/dL      Peak postprandial:   less than 180 mg/dL (1-2 hours)      Critically ill patients:  140 - 180 mg/dL   Lab Results  Component Value Date   GLUCAP 251 (H) 02/24/2019    Results for ZUBAIR, LOFTON (MRN [id number]) as of 02/24/2019 09:04  Ref. Range 02/23/2019 14:02 02/23/2019 20:02 02/23/2019 23:21 02/24/2019 03:57  Glucose-Capillary Latest Ref Range: 70 - 99 mg/dL 188 (H)  Novolog 4 units 243 (H)  Novolog 6 units 236 (H)  Novolog 6 units 251 (H)  Novolog 6 units     Review of Glycemic Control  Diabetes history: DM2 (per chart fairly new diagnosis)  Outpatient Diabetes medications: none listed at this time  Current orders for Inpatient glycemic control: Lantus 5 units daily (first dose 0926 this am)                                                                          Novolog moderate scale (0-15 units) Q4 hours   Per chart, patient Spanish speaking (from [place]) and information received from his employer. Admit CBG 188 mg/dl and expect to increase due to steroids and tube feeds. TF started at 2ml/hour with goal of 51ml/hour. Solucortef to start today 100mg  Q 8 hours.    Labs on admit yesterday were [place] 15. Current labs C02 19/ [place] 14.                                                       Due to Covid 19 + room would recommend maximizing SQ insulin dosages prior to considering insulin drip. Would recommend adding Novolog TF coverage at this time.   MD please consider the following inpatient insulin adjustments:   1  Add Novolog 6 units tube feed coverage Q4 hours (hold if TF held or stopped)    -- Will follow during hospitalization.--  Jonna Clark RN, MSN Diabetes Coordinator Inpatient Glycemic Control Team Team Pager: [phone] (8am-5pm)

## 2019-02-25 LAB — COOXEMETRY PANEL
Carboxyhemoglobin: 0.8 % (ref 0.5–1.5)
Carboxyhemoglobin: 1 % (ref 0.5–1.5)
Methemoglobin: 2 % — ABNORMAL HIGH (ref 0.0–1.5)
Methemoglobin: 1.3 % (ref 0.0–1.5)
O2 Saturation: 82.3 %
O2 Saturation: 85.2 %
Total hemoglobin: 13.8 g/dL (ref 12.0–16.0)
Total hemoglobin: 18.2 g/dL — ABNORMAL HIGH (ref 12.0–16.0)

## 2019-02-25 LAB — COMPREHENSIVE METABOLIC PANEL
ALT: 132 U/L — ABNORMAL HIGH (ref 0–44)
AST: 202 U/L — ABNORMAL HIGH (ref 15–41)
Albumin: 1.8 g/dL — ABNORMAL LOW (ref 3.5–5.0)
Alkaline Phosphatase: 74 U/L (ref 38–126)
Anion gap: 11 (ref 5–15)
BUN: 31 mg/dL — ABNORMAL HIGH (ref 6–20)
CO2: 22 mmol/L (ref 22–32)
Calcium: 7 mg/dL — ABNORMAL LOW (ref 8.9–10.3)
Chloride: 107 mmol/L (ref 98–111)
Creatinine, Ser: 3.05 mg/dL — ABNORMAL HIGH (ref 0.61–1.24)
GFR calc Af Amer: 27 mL/min — ABNORMAL LOW (ref >60)
GFR calc non Af Amer: 23 mL/min — ABNORMAL LOW (ref >60)
Glucose, Bld: 178 mg/dL — ABNORMAL HIGH (ref 70–99)
Potassium: 4.9 mmol/L (ref 3.5–5.1)
Sodium: 140 mmol/L (ref 135–145)
Total Bilirubin: 1 mg/dL (ref 0.3–1.2)
Total Protein: 5.3 g/dL — ABNORMAL LOW (ref 6.5–8.1)

## 2019-02-25 LAB — POCT I-STAT 7, (LYTES, BLD GAS, ICA,H+H)
Acid-base deficit: 3 mmol/L — ABNORMAL HIGH (ref 0.0–2.0)
Acid-base deficit: 3 mmol/L — ABNORMAL HIGH (ref 0.0–2.0)
Bicarbonate: 23.4 mmol/L (ref 20.0–28.0)
Bicarbonate: 23.6 mmol/L (ref 20.0–28.0)
Calcium, Ion: 0.99 mmol/L — ABNORMAL LOW (ref 1.15–1.40)
Calcium, Ion: 1.05 mmol/L — ABNORMAL LOW (ref 1.15–1.40)
HCT: 35 % — ABNORMAL LOW (ref 39.0–52.0)
HCT: 34 % — ABNORMAL LOW (ref 39.0–52.0)
Hemoglobin: 11.9 g/dL — ABNORMAL LOW (ref 13.0–17.0)
Hemoglobin: 11.6 g/dL — ABNORMAL LOW (ref 13.0–17.0)
O2 Saturation: 98 %
O2 Saturation: 93 %
Patient temperature: 97.1
Patient temperature: 96.8
Potassium: 4.8 mmol/L (ref 3.5–5.1)
Potassium: 4.9 mmol/L (ref 3.5–5.1)
Sodium: 138 mmol/L (ref 135–145)
Sodium: 137 mmol/L (ref 135–145)
TCO2: 25 mmol/L (ref 22–32)
TCO2: 25 mmol/L (ref 22–32)
pCO2 arterial: 45.5 mmHg (ref 32.0–48.0)
pCO2 arterial: 47.2 mmHg (ref 32.0–48.0)
pH, Arterial: 7.315 — ABNORMAL LOW (ref 7.350–7.450)
pH, Arterial: 7.301 — ABNORMAL LOW (ref 7.350–7.450)
pO2, Arterial: 114 mmHg — ABNORMAL HIGH (ref 83.0–108.0)
pO2, Arterial: 72 mmHg — ABNORMAL LOW (ref 83.0–108.0)

## 2019-02-25 LAB — CBC WITH DIFFERENTIAL/PLATELET
Abs Immature Granulocytes: 0.17 10*3/uL — ABNORMAL HIGH (ref 0.00–0.07)
Basophils Absolute: 0 10*3/uL (ref 0.0–0.1)
Basophils Relative: 0 %
Eosinophils Absolute: 0 10*3/uL (ref 0.0–0.5)
Eosinophils Relative: 0 %
HCT: 39.2 % (ref 39.0–52.0)
Hemoglobin: 12.7 g/dL — ABNORMAL LOW (ref 13.0–17.0)
Immature Granulocytes: 1 %
Lymphocytes Relative: 11 %
Lymphs Abs: 1.6 10*3/uL (ref 0.7–4.0)
MCH: 29.3 pg (ref 26.0–34.0)
MCHC: 32.4 g/dL (ref 30.0–36.0)
MCV: 90.5 fL (ref 80.0–100.0)
Monocytes Absolute: 0.2 10*3/uL (ref 0.1–1.0)
Monocytes Relative: 1 %
Neutro Abs: 13.1 10*3/uL — ABNORMAL HIGH (ref 1.7–7.7)
Neutrophils Relative %: 87 %
Platelets: 145 10*3/uL — ABNORMAL LOW (ref 150–400)
RBC: 4.33 MIL/uL (ref 4.22–5.81)
RDW: 14.1 % (ref 11.5–15.5)
WBC: 15.1 10*3/uL — ABNORMAL HIGH (ref 4.0–10.5)
nRBC: 0.1 % (ref 0.0–0.2)

## 2019-02-25 LAB — QUANTIFERON-TB GOLD PLUS: QuantiFERON-TB Gold Plus: NEGATIVE

## 2019-02-25 LAB — INTERLEUKIN-6, PLASMA: Interleukin-6, Plasma: 1261.9 pg/mL — ABNORMAL HIGH (ref 0.0–12.2)

## 2019-02-25 LAB — GLUCOSE, CAPILLARY
Glucose-Capillary: 108 mg/dL — ABNORMAL HIGH (ref 70–99)
Glucose-Capillary: 147 mg/dL — ABNORMAL HIGH (ref 70–99)
Glucose-Capillary: 167 mg/dL — ABNORMAL HIGH (ref 70–99)
Glucose-Capillary: 167 mg/dL — ABNORMAL HIGH (ref 70–99)
Glucose-Capillary: 170 mg/dL — ABNORMAL HIGH (ref 70–99)
Glucose-Capillary: 174 mg/dL — ABNORMAL HIGH (ref 70–99)

## 2019-02-25 LAB — QUANTIFERON-TB GOLD PLUS (RQFGPL)
QuantiFERON Mitogen Value: 1.51 IU/mL
QuantiFERON Nil Value: 0.05 IU/mL
QuantiFERON TB1 Ag Value: 0.04 IU/mL
QuantiFERON TB2 Ag Value: 0.05 IU/mL

## 2019-02-25 LAB — FERRITIN: Ferritin: 3564 ng/mL — ABNORMAL HIGH (ref 24–336)

## 2019-02-25 LAB — RENAL FUNCTION PANEL
Albumin: 1.8 g/dL — ABNORMAL LOW (ref 3.5–5.0)
Anion gap: 11 (ref 5–15)
BUN: 34 mg/dL — ABNORMAL HIGH (ref 6–20)
CO2: 21 mmol/L — ABNORMAL LOW (ref 22–32)
Calcium: 7.3 mg/dL — ABNORMAL LOW (ref 8.9–10.3)
Chloride: 106 mmol/L (ref 98–111)
Creatinine, Ser: 2.97 mg/dL — ABNORMAL HIGH (ref 0.61–1.24)
GFR calc Af Amer: 28 mL/min — ABNORMAL LOW (ref >60)
GFR calc non Af Amer: 24 mL/min — ABNORMAL LOW (ref >60)
Glucose, Bld: 192 mg/dL — ABNORMAL HIGH (ref 70–99)
Phosphorus: 5 mg/dL — ABNORMAL HIGH (ref 2.5–4.6)
Potassium: 5.1 mmol/L (ref 3.5–5.1)
Sodium: 138 mmol/L (ref 135–145)

## 2019-02-25 LAB — C-REACTIVE PROTEIN: CRP: 35.1 mg/dL — ABNORMAL HIGH (ref <1.0)

## 2019-02-25 LAB — PHOSPHORUS: Phosphorus: 4.8 mg/dL — ABNORMAL HIGH (ref 2.5–4.6)

## 2019-02-25 LAB — HEPARIN LEVEL (UNFRACTIONATED)
Heparin Unfractionated: 0.24 IU/mL — ABNORMAL LOW (ref 0.30–0.70)
Heparin Unfractionated: 0.21 IU/mL — ABNORMAL LOW (ref 0.30–0.70)

## 2019-02-25 LAB — TRIGLYCERIDES: Triglycerides: 314 mg/dL — ABNORMAL HIGH (ref <150)

## 2019-02-25 LAB — MAGNESIUM: Magnesium: 2.3 mg/dL (ref 1.7–2.4)

## 2019-02-25 MED ORDER — FENTANYL BOLUS VIA INFUSION
50.0000 ug | INTRAVENOUS | Status: DC | PRN
  Administered 2019-02-26 – 2019-03-06 (×40): 50 ug via INTRAVENOUS
  Filled 2019-02-25: qty 50

## 2019-02-25 MED ORDER — OXYCODONE HCL 5 MG PO TABS
5.0000 mg | ORAL_TABLET | ORAL | Status: DC
  Administered 2019-02-25 – 2019-02-26 (×7): 5 mg via ORAL
  Filled 2019-02-25 (×7): qty 1

## 2019-02-25 MED ORDER — ALTEPLASE 2 MG IJ SOLR
2.0000 mg | Freq: Once | INTRAMUSCULAR | Status: AC
  Administered 2019-02-25: 2 mg
  Filled 2019-02-25 (×2): qty 2

## 2019-02-25 MED ORDER — ALTEPLASE 2 MG IJ SOLR
2.0000 mg | Freq: Once | INTRAMUSCULAR | Status: AC
  Administered 2019-02-25: 2 mg
  Filled 2019-02-25: qty 2

## 2019-02-25 MED ORDER — DEXMEDETOMIDINE HCL IN NACL 400 MCG/100ML IV SOLN
0.0000 ug/kg/h | INTRAVENOUS | Status: AC
  Administered 2019-02-25: 1.2 ug/kg/h via INTRAVENOUS
  Administered 2019-02-25: 0.922 ug/kg/h via INTRAVENOUS
  Administered 2019-02-25 – 2019-02-26 (×3): 1.2 ug/kg/h via INTRAVENOUS
  Administered 2019-02-27 (×3): 0.8 ug/kg/h via INTRAVENOUS
  Administered 2019-02-27: 0.4 ug/kg/h via INTRAVENOUS
  Administered 2019-02-27: 1 ug/kg/h via INTRAVENOUS
  Administered 2019-02-28 (×2): 1.2 ug/kg/h via INTRAVENOUS
  Filled 2019-02-25 (×13): qty 100

## 2019-02-25 MED ORDER — CALCIUM GLUCONATE-NACL 1-0.675 GM/50ML-% IV SOLN
1.0000 g | Freq: Once | INTRAVENOUS | Status: AC
  Administered 2019-02-25: 1000 mg via INTRAVENOUS
  Filled 2019-02-25: qty 50

## 2019-02-25 MED ORDER — FENTANYL 2500MCG IN NS 250ML (10MCG/ML) PREMIX INFUSION
50.0000 ug/h | INTRAVENOUS | Status: DC
  Administered 2019-02-25 – 2019-03-01 (×8): 200 ug/h via INTRAVENOUS
  Administered 2019-03-01: 100 ug/h via INTRAVENOUS
  Filled 2019-02-25 (×9): qty 250

## 2019-02-25 NOTE — Progress Notes (Signed)
[facility] Progress Note  Name: Jonathan [name] MRN: [id number] DOB: 29-Jan-1971  Chief Complaint:  Shortness of breath   Subjective:  Spoke with bedside nurse.  He has been on CRRT with 2.2 L UF over 4/22; tolerating net even and actually tolerating pulling a little.  She feels he could advance to net 50 cc/hr off without difficulty.   He is off of the vaso and levophed and continues on low dose dobutamine.  Anuric.   Review of systems:  Unable to obtain 2/2 mechanical vent; covid  -------------  Background on consult:   Jonathan [name] is a 48 y.o. male with a history of recently diagnosed diabetes who presented to the hospital with respiratory distress and altered mental status.  He was intubated and proned and found to be COVID-19 positive.  He has been anuric.  Critical care has consulted requesting CRRT.  His primary team has spoken with his family in [place] via an interpreter and they have indicated that they would want aggressive measures such as dialysis.  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  He has been on levophed and vasopressin with downtrending levophed requirements; previously maxed on levo.  Dobutamine is being added per nursing report.   Intake/Output Summary (Last 24 hours) at 02/25/2019 0557 Last data filed at 02/25/2019 0500 Gross per 24 hour  Intake 4266.61 ml  Output 2250 ml  Net 2016.61 ml    Vitals:  Vitals:   02/25/19 0400 02/25/19 0427 02/25/19 0433 02/25/19 0500  BP: 110/79     Pulse: 73 73  75  Resp: (!) 32 (!) 32  (!) 32  Temp:   (!) 97.4 F (36.3 C)   TempSrc:   Rectal   SpO2: 100% 100%  100%  Weight:   86.8 kg   Height:         Physical Exam:  Due to the nature of this patient's suspected COVID-19 with isolation and in keeping with efforts to prevent the spread of infection and to conserve personal protective equipment, a physical exam was not personally performed.  Patient's exam were discussed in detail  with the RN.  A chart review of other providers notes and the patient's lab work as well as review of other pertinent studies was performed.   Location of service: [facility]   Per nursing:  General adult male in bed prone on vent HEENT normocephalic atraumatic  Lungs clear to auscultation but diminished  Heart regular rate and rhythm  Abdomen soft nontender nondistended  Extremities 1-2+ edema  Access: left subclavian vascath intact per nursing   Medications reviewed   Labs:  BMP Latest Ref Rng & Units 02/25/2019 02/25/2019 02/24/2019  Glucose 70 - 99 mg/dL 178(H) - -  BUN 6 - 20 mg/dL 31(H) - -  Creatinine 0.61 - 1.24 mg/dL 3.05(H) - -  Sodium 135 - 145 mmol/L 140 138 138  Potassium 3.5 - 5.1 mmol/L 4.9 4.8 6.1(H)  Chloride 98 - 111 mmol/L 107 - -  CO2 22 - 32 mmol/L 22 - -  Calcium 8.9 - 10.3 mg/dL 7.0(L) - -     Assessment/Plan:   # AKI  - Secondary to ATN in the setting of shock, COVID19 - Continue CRRT.  Advance goal from net even to negative 50 cc/hr as tolerated  # COVID - 19 positive  - per pulmonology  - on hydroxychloroquine   # Acute hypoxic respiratory failure - Intubated and proned  -  2/2 COVID 19 with ARDS - CRRT as above to optimize volume status    # Hyperkalemia - Resolved with CRRT   # Acute metabolic acidosis - Continue CRRT   # Septic shock - Off of levophed and vaso; on dobutamine   Claudia Desanctis, MD 02/25/2019 5:57 AM

## 2019-02-25 NOTE — Progress Notes (Addendum)
ANTICOAGULATION CONSULT NOTE  Pharmacy Consult per for COVID with elevated d-dimer   Indication: VTE prophylaxis  Patient Measurements: Height: 5\' 5"  (165.1 cm) Weight: 191 lb 5.8 oz (86.8 kg) IBW/kg (Calculated) : 61.5  Vital Signs: Temp: 96.8 F (36 C) (04/23 1122) Temp Source: Rectal (04/23 1122) BP: 101/79 (04/23 1200) Pulse Rate: 63 (04/23 1200)  Labs: Recent Labs    02/23/19 1215  02/24/19 0054  02/24/19 1700 02/24/19 1805 02/24/19 2038 02/25/19 0421 02/25/19 0456 02/25/19 0457 02/25/19 1216  HGB 14.7   < > 13.1   < >  --  11.9*  --  11.9* 12.7*  --   --   HCT 44.0   < > 40.7   < >  --  35.0*  --  35.0* 39.2  --   --   PLT 166  --  152  --   --   --   --   --  145*  --   --   HEPARINUNFRC  --   --   --   --   --   --  0.38  --   --  0.24* 0.21*  CREATININE 1.10  --  2.83*  --  3.90*  --   --   --  3.05*  --   --    < > = values in this interval not displayed.    Estimated Creatinine Clearance: 30 mL/min (A) (by C-G formula based on SCr of 3.05 mg/dL (H)).   Medical History: No past medical history on file.  Assessment: 48 yo M admitted with respiratory distress, cough and AMS found to be COVID+. D-dimers elevated on 4/21 at 11.18. Patient's CrCl ~30 with plans to start CRRT today. Pharmacy consulted to dose heparin drip due concerns for increased coagulopathy with elevated d-dimer and COVID-19 diagnosis.   Heparin level is therapeutic at 0.21. CBCs stable, no s/sx of bleeding documented.   Goal of Therapy:  Heparin level 0.1-0.25 units/mL Monitor platelets by anticoagulation protocol: Yes   Plan:  Continue IV heparin at 550 units/hr Monitor daily heparin levels, CBCs, and s/sx of bleeding  Gwenlyn Found, Sherian Rein D PGY1 Pharmacy Resident  Phone [phone] 02/25/2019   1:44 PM

## 2019-02-25 NOTE — Progress Notes (Signed)
NAME:  Jonathan [name], MRN:  [id number], DOB:  1971-04-20, LOS: 2 ADMISSION DATE:  02/23/2019, CONSULTATION DATE:  02/23/2019 REFERRING MD:  [facility], CHIEF COMPLAINT:  Respiratory failure   Brief History   48 year old man intubated for severe respiratory failure requiring prone ventilation and nitric oxide for ARDS related to COVID-19.  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  Past Medical History  Limited information. T2DM recently diagnosed on OHA.   Significant Hospital Events   4/21 admitted and immediately proned on inverse ratio PC ventilation with high PEEP to maintain saturation >85%, Started on NO at [place]:  PCCM 4/21  Procedures:  4/21 - ETT 7.5,  4/21 - RIJ [facility]  Significant Diagnostic Tests:  POC U/S 4/21: Bilateral interstitial pattern with bilateral dense consolidation at bases. Echo shows low-normal LVSF. RV moderately dilated with septal shift.   Micro Data:  Blood cultures -PND SARS-CoV2 - POSITIVE   Antimicrobials:  Azithromycin 4/21 >> Ceftriaxone 4/21 >> Hydroxychloroquine 4/21 > 4/22  Interim history/subjective:  Started on CVVH. Oxygenation slightly better.  PEEP and FiO2 are down to 70% and 16 Levophed and vasopressin.  Continues on low-dose dobutamine.  Objective   Blood pressure 110/79, pulse 73, temperature 98.1 F (36.7 C), temperature source Rectal, resp. rate (!) 32, height 5\' 5"  (1.651 m), weight 86.8 kg, SpO2 100 %. CVP:  [16 mmHg] 16 mmHg  Vent Mode: PRVC FiO2 (%):  [50 %-100 %] 70 % Set Rate:  [32 bmp] 32 bmp Vt Set:  [360 mL] 360 mL PEEP:  [16 cmH20-20 cmH20] 16 cmH20 Plateau Pressure:  [33 cmH20-36 cmH20] 33 cmH20   Intake/Output Summary (Last 24 hours) at 02/25/2019 0757 Last data filed at 02/25/2019 0700 Gross per 24 hour  Intake 4068.6 ml  Output 2587 ml  Net 1481.6 ml   Filed Weights    02/24/19 0400 02/24/19 2100 02/25/19 0433  Weight: 82.5 kg 82.5 kg 86.8 kg   Examination: Gen:      No acute distress HEENT:  EOMI, sclera anicteric, ETT Neck:     No masses; no thyromegaly Lungs:    Clear to auscultation bilaterally; normal respiratory effort CV:         Regular rate and rhythm; no murmurs Abd:      + bowel sounds; soft, non-tender; no palpable masses, no distension Ext:    No edema; adequate peripheral perfusion Skin:      Warm and dry; no rash Neuro: Sedated, paralyzed  Resolved Hospital Problem list   No   Assessment & Plan:  48 year old with severe ARDS, multiorgan failure due to COVID  Resp failure, ARDS Actemra x 1 on 4/22 Continue proning, inhaled NO Keep PEEP at 18 to maintain driving pressure of about 15 Follow ABG  Shock, RV dysfunction noted on bedside echo- Likely related to ARDS Off pressors Continue stress dose steroids Monitor CVP and Coox  Elevated LFTs. Shock liver Monitor  Diabetes mellitus Continue lantus  AKI Monitor urine output On CVVH. Will try gentle fluid removal today Negative 40cc/hr On low dose heparin gtt due to elevated d imer and CVVH  Sedation Continue fentanyl. precedex gtt. Add oxy 5 q4  Best practice:  Diet: trickle feed only while prone Pain/Anxiety/Delirium protocol (if indicated): Fentanyl, intermittent lorazepam. Target BIS <40. Start topical fentanyl, clonazepam and quetiapine. VAP protocol (if indicated): Bundle in place. DVT prophylaxis:  Low dose heparin gtt GI prophylaxis: Famotidine Glucose control: Lantus, SSI coverage. Mobility: Bedrest Code Status: Full Family Communication: Family in [place]. Was able to talk to wife and daughter through interpreter.  Have communicated with employer. Disposition: ICU.  Labs   CBC: Recent Labs  Lab 02/23/19 0946  02/23/19 1215  02/24/19 0054 02/24/19 0505 02/24/19 1342 02/24/19 1805 02/25/19 0421 02/25/19 0456  WBC 10.1  --  8.2  --  15.7*  --   --    --   --  15.1*  NEUTROABS 8.5*  --   --   --  13.1*  --   --   --   --  PENDING  HGB 14.7   < > 14.7   < > 13.1 11.9* 12.2* 11.9* 11.9* 12.7*  HCT 44.3   < > 44.0   < > 40.7 35.0* 36.0* 35.0* 35.0* 39.2  MCV 88.4  --  89.1  --  92.1  --   --   --   --  90.5  PLT 192  --  166  --  152  --   --   --   --  145*   < > = values in this interval not displayed.    Basic Metabolic Panel: Recent Labs  Lab 02/23/19 0946  02/23/19 1215  02/24/19 0054  02/24/19 1342 02/24/19 1700 02/24/19 1805 02/25/19 0421 02/25/19 0456  NA 137   < >  --    < > 140   < > 137 140 138 138 140  K 4.5   < >  --    < > 4.5   < > 6.7* 6.0* 6.1* 4.8 4.9  CL 105  --   --   --  107  --   --  108  --   --  107  CO2 17*  --   --   --  19*  --   --  19*  --   --  22  GLUCOSE 199*  --   --   --  273*  --   --  286*  --   --  178*  BUN 13  --   --   --  26*  --   --  36*  --   --  31*  CREATININE 0.95  --  1.10  --  2.83*  --   --  3.90*  --   --  3.05*  CALCIUM 7.8*  --   --   --  6.6*  --   --  6.2*  --   --  7.0*  MG  --   --   --   --  1.9  --   --   --   --   --  2.3  PHOS  --   --   --   --  5.6*  --   --  5.6*  --   --  4.8*   < > = values in this interval not displayed.   GFR: Estimated Creatinine Clearance: 30 mL/min (A) (by C-G formula based on SCr of 3.05 mg/dL (H)). Recent Labs  Lab 02/23/19 0946 02/23/19 1215 02/24/19 0054 02/25/19 0456  PROCALCITON 4.53  --   --   --   WBC 10.1 8.2 15.7* 15.1*  LATICACIDVEN  --  1.5  --   --     Liver Function Tests: Recent Labs  Lab 02/23/19 0946 02/24/19 0054 02/24/19 1700 02/25/19 0456  AST 204* 176*  --  202*  ALT 125* 100*  --  132*  ALKPHOS 83 56  --  74  BILITOT 1.2 0.9  --  1.0  PROT 6.5 5.2*  --  5.3*  ALBUMIN 2.5* 2.0* 1.8* 1.8*   No results for input(s): LIPASE, AMYLASE in the last 168 hours. No results for input(s): AMMONIA in the last 168 hours.  ABG    Component Value Date/Time   PHART 7.315 (L) 02/25/2019 0421   PCO2ART 45.5  02/25/2019 0421   PO2ART 114.0 (H) 02/25/2019 0421   HCO3 23.4 02/25/2019 0421   TCO2 25 02/25/2019 0421   ACIDBASEDEF 3.0 (H) 02/25/2019 0421   O2SAT 82.3 02/25/2019 0430     Coagulation Profile: No results for input(s): INR, PROTIME in the last 168 hours.  Cardiac Enzymes: No results for input(s): CKTOTAL, CKMB, CKMBINDEX, TROPONINI in the last 168 hours.  HbA1C: No results found for: HGBA1C  CBG: Recent Labs  Lab 02/24/19 1218 02/24/19 1648 02/24/19 2012 02/24/19 2334 02/25/19 0429  GLUCAP 305* 256* 214* 150* 147*   The patient is critically ill with multiple organ system failure and requires high complexity decision making for assessment and support, frequent evaluation and titration of therapies, advanced monitoring, review of radiographic studies and interpretation of complex data.   Critical Care Time devoted to patient care services, exclusive of separately billable procedures, described in this note is 35 minutes.   Marshell Garfinkel MD [facility] Pager [phone] If no answer call [phone] 02/25/2019, 8:20 AM

## 2019-02-25 NOTE — Progress Notes (Signed)
PEEP dropped to 16 per ABG results.  Pt tolerating well

## 2019-02-25 NOTE — Progress Notes (Signed)
ANTICOAGULATION CONSULT NOTE  Pharmacy Consult per for COVID with elevated d-dimer   Indication: VTE prophylaxis  Patient Measurements: Height: 5\' 5"  (165.1 cm) Weight: 191 lb 5.8 oz (86.8 kg) IBW/kg (Calculated) : 61.5  Vital Signs: Temp: 97.4 F (36.3 C) (04/23 0433) Temp Source: Rectal (04/23 0433) BP: 110/79 (04/23 0400) Pulse Rate: 75 (04/23 0500)  Labs: Recent Labs    02/23/19 0946  02/23/19 1215  02/24/19 0054  02/24/19 1342 02/24/19 1700 02/24/19 1805 02/24/19 2038 02/25/19 0421 02/25/19 0457  HGB 14.7   < > 14.7   < > 13.1   < > 12.2*  --  11.9*  --  11.9*  --   HCT 44.3   < > 44.0   < > 40.7   < > 36.0*  --  35.0*  --  35.0*  --   PLT 192  --  166  --  152  --   --   --   --   --   --   --   HEPARINUNFRC  --   --   --   --   --   --   --   --   --  0.38  --  0.24*  CREATININE 0.95  --  1.10  --  2.83*  --   --  3.90*  --   --   --   --    < > = values in this interval not displayed.    Estimated Creatinine Clearance: 23.5 mL/min (A) (by C-G formula based on SCr of 3.9 mg/dL (H)).   Medical History: No past medical history on file.  Assessment: 48 yo M admitted with respiratory distress, cough and AMS found to be COVID+. D-dimers elevated on 4/21 at 11.18. Patient's CrCl ~30 with plans to start CRRT today. Pharmacy consulted to dose heparin drip due concerns for increased coagulopathy with elevated d-dimer and COVID-19 diagnosis.   Heparin level is therapeutic at 0.24.    Goal of Therapy:  Heparin level 0.1-0.25 units/mL Monitor platelets by anticoagulation protocol: Yes   Plan:  Continue IV heparin at 550 units/hr Check confirmatory level in 8 hours   Vertis Kelch, PharmD PGY1 Pharmacy Resident Phone [phone] 02/25/2019       5:30 AM

## 2019-02-25 NOTE — Progress Notes (Addendum)
Nutrition Follow-up RD working remotely.  DOCUMENTATION CODES:   Not applicable  INTERVENTION:   **Per ASPEN and SCCM guidelines for COVID positive patients requiring proning, recommend continue gastric feedings during prone positioning, keeping HOB elevated (reverse trendelenburg) to at least 10-25 degrees.**  Recommend keep HOB elevated (reverse trendelenburg) at >/= 10-25 degrees while proning.   Even while in prone position, recommend start TF via OGT:  Vital AF 1.2 at 20 ml/h, increase by 10 ml every 8 hours to goal rate of 40 ml/h  Pro-stat 60 ml TID  At goal, will provide 1752 kcal, 162 gm protein, 779 ml free water daily   NUTRITION DIAGNOSIS:   Inadequate oral intake related to inability to eat as evidenced by NPO status.  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 Admitted with fever, cough, AMS, and respiratory distress. COVID-19 positive.  Patient remains intubated on ventilator support, in prone position. MV: 10.9 L/min Temp (24hrs), Avg:96.6 F (35.9 C), Min:93.5 F (34.2 C), Max:98.1 F (36.7 C)    Weight up to 86.8 kg today, admission weight 77.7 kg Generalized, mild pitting edema present today per RN documentation. Receiving CRRT.  TF has been off since 4/22 at 11am. No problems with tolerance noted. Suspect TF held due to proning. HOB is flat.  Labs reviewed. Triglycerides 314 (H) trending down  Medications reviewed and include Colace, Solu-cortef, Novolog, Lantus. Dobutamine started. Levophed and vasopressin are off.   Diet Order:   Diet Order            Diet NPO time specified  Diet effective now              EDUCATION NEEDS:   No education needs have been identified at this time  Skin:  Skin  Assessment: Reviewed RN Assessment  Last BM:  no BM documented  Height:   Ht Readings from Last 1 Encounters:  02/24/19 5\' 5"  (1.651 m)    Weight:   Wt Readings from Last 1 Encounters:  02/25/19 86.8 kg  02/23/19 77.7 kg (BMI=28.5)  Ideal Body Weight:  61.8 kg  BMI:  Body mass index is 31.84 kg/m.  Estimated Nutritional Needs:   Kcal:  1820  Protein:  155-195 gm  Fluid:  2.2 L    Molli Barrows, RD, LDN, CNSC Pager [phone] After Hours Pager [phone]

## 2019-02-26 LAB — COMPREHENSIVE METABOLIC PANEL
ALT: 102 U/L — ABNORMAL HIGH (ref 0–44)
AST: 98 U/L — ABNORMAL HIGH (ref 15–41)
Albumin: 1.7 g/dL — ABNORMAL LOW (ref 3.5–5.0)
Alkaline Phosphatase: 76 U/L (ref 38–126)
Anion gap: 12 (ref 5–15)
BUN: 55 mg/dL — ABNORMAL HIGH (ref 6–20)
CO2: 20 mmol/L — ABNORMAL LOW (ref 22–32)
Calcium: 7 mg/dL — ABNORMAL LOW (ref 8.9–10.3)
Chloride: 105 mmol/L (ref 98–111)
Creatinine, Ser: 4.17 mg/dL — ABNORMAL HIGH (ref 0.61–1.24)
GFR calc Af Amer: 18 mL/min — ABNORMAL LOW (ref >60)
GFR calc non Af Amer: 16 mL/min — ABNORMAL LOW (ref >60)
Glucose, Bld: 201 mg/dL — ABNORMAL HIGH (ref 70–99)
Potassium: 5.2 mmol/L — ABNORMAL HIGH (ref 3.5–5.1)
Sodium: 137 mmol/L (ref 135–145)
Total Bilirubin: 0.7 mg/dL (ref 0.3–1.2)
Total Protein: 4.8 g/dL — ABNORMAL LOW (ref 6.5–8.1)

## 2019-02-26 LAB — COOXEMETRY PANEL
Carboxyhemoglobin: 0.9 % (ref 0.5–1.5)
Methemoglobin: 1.7 % — ABNORMAL HIGH (ref 0.0–1.5)
O2 Saturation: 77.1 %
Total hemoglobin: 11.8 g/dL — ABNORMAL LOW (ref 12.0–16.0)

## 2019-02-26 LAB — POCT I-STAT 7, (LYTES, BLD GAS, ICA,H+H)
Acid-base deficit: 2 mmol/L (ref 0.0–2.0)
Bicarbonate: 24.1 mmol/L (ref 20.0–28.0)
Calcium, Ion: 1.04 mmol/L — ABNORMAL LOW (ref 1.15–1.40)
HCT: 33 % — ABNORMAL LOW (ref 39.0–52.0)
Hemoglobin: 11.2 g/dL — ABNORMAL LOW (ref 13.0–17.0)
O2 Saturation: 87 %
Patient temperature: 96.4
Potassium: 4.8 mmol/L (ref 3.5–5.1)
Sodium: 137 mmol/L (ref 135–145)
TCO2: 25 mmol/L (ref 22–32)
pCO2 arterial: 44 mmHg (ref 32.0–48.0)
pH, Arterial: 7.34 — ABNORMAL LOW (ref 7.350–7.450)
pO2, Arterial: 53 mmHg — ABNORMAL LOW (ref 83.0–108.0)

## 2019-02-26 LAB — CBC WITH DIFFERENTIAL/PLATELET
Abs Immature Granulocytes: 0.24 10*3/uL — ABNORMAL HIGH (ref 0.00–0.07)
Basophils Absolute: 0 10*3/uL (ref 0.0–0.1)
Basophils Relative: 0 %
Eosinophils Absolute: 0 10*3/uL (ref 0.0–0.5)
Eosinophils Relative: 0 %
HCT: 35.7 % — ABNORMAL LOW (ref 39.0–52.0)
Hemoglobin: 11.3 g/dL — ABNORMAL LOW (ref 13.0–17.0)
Immature Granulocytes: 2 %
Lymphocytes Relative: 11 %
Lymphs Abs: 1.5 10*3/uL (ref 0.7–4.0)
MCH: 29.4 pg (ref 26.0–34.0)
MCHC: 31.7 g/dL (ref 30.0–36.0)
MCV: 93 fL (ref 80.0–100.0)
Monocytes Absolute: 0.2 10*3/uL (ref 0.1–1.0)
Monocytes Relative: 1 %
Neutro Abs: 12.1 10*3/uL — ABNORMAL HIGH (ref 1.7–7.7)
Neutrophils Relative %: 86 %
Platelets: 141 10*3/uL — ABNORMAL LOW (ref 150–400)
RBC: 3.84 MIL/uL — ABNORMAL LOW (ref 4.22–5.81)
RDW: 14.5 % (ref 11.5–15.5)
WBC: 14 10*3/uL — ABNORMAL HIGH (ref 4.0–10.5)
nRBC: 0.8 % — ABNORMAL HIGH (ref 0.0–0.2)

## 2019-02-26 LAB — RENAL FUNCTION PANEL
Albumin: 1.9 g/dL — ABNORMAL LOW (ref 3.5–5.0)
Anion gap: 12 (ref 5–15)
BUN: 45 mg/dL — ABNORMAL HIGH (ref 6–20)
CO2: 22 mmol/L (ref 22–32)
Calcium: 7.3 mg/dL — ABNORMAL LOW (ref 8.9–10.3)
Chloride: 105 mmol/L (ref 98–111)
Creatinine, Ser: 3.49 mg/dL — ABNORMAL HIGH (ref 0.61–1.24)
GFR calc Af Amer: 23 mL/min — ABNORMAL LOW (ref >60)
GFR calc non Af Amer: 20 mL/min — ABNORMAL LOW (ref >60)
Glucose, Bld: 152 mg/dL — ABNORMAL HIGH (ref 70–99)
Phosphorus: 4.7 mg/dL — ABNORMAL HIGH (ref 2.5–4.6)
Potassium: 4.8 mmol/L (ref 3.5–5.1)
Sodium: 139 mmol/L (ref 135–145)

## 2019-02-26 LAB — BLOOD GAS, ARTERIAL
Acid-base deficit: 5.5 mmol/L — ABNORMAL HIGH (ref 0.0–2.0)
Bicarbonate: 19.9 mmol/L — ABNORMAL LOW (ref 20.0–28.0)
Drawn by: 36496
FIO2: 80
MECHVT: 360 mL
Nitric Oxide: 40
O2 Saturation: 97.4 %
PEEP: 18 cmH2O
Patient temperature: 102.4
RATE: 32 resp/min
pCO2 arterial: 46.9 mmHg (ref 32.0–48.0)
pH, Arterial: 7.265 — ABNORMAL LOW (ref 7.350–7.450)
pO2, Arterial: 125 mmHg — ABNORMAL HIGH (ref 83.0–108.0)

## 2019-02-26 LAB — GLUCOSE, CAPILLARY
Glucose-Capillary: 201 mg/dL — ABNORMAL HIGH (ref 70–99)
Glucose-Capillary: 193 mg/dL — ABNORMAL HIGH (ref 70–99)
Glucose-Capillary: 174 mg/dL — ABNORMAL HIGH (ref 70–99)
Glucose-Capillary: 155 mg/dL — ABNORMAL HIGH (ref 70–99)
Glucose-Capillary: 105 mg/dL — ABNORMAL HIGH (ref 70–99)
Glucose-Capillary: 133 mg/dL — ABNORMAL HIGH (ref 70–99)

## 2019-02-26 LAB — FERRITIN: Ferritin: 1783 ng/mL — ABNORMAL HIGH (ref 24–336)

## 2019-02-26 LAB — HEPARIN LEVEL (UNFRACTIONATED)
Heparin Unfractionated: 0.13 IU/mL — ABNORMAL LOW (ref 0.30–0.70)
Heparin Unfractionated: 0.26 IU/mL — ABNORMAL LOW (ref 0.30–0.70)

## 2019-02-26 LAB — MAGNESIUM: Magnesium: 2.5 mg/dL — ABNORMAL HIGH (ref 1.7–2.4)

## 2019-02-26 LAB — INTERLEUKIN-6, PLASMA
Interleukin-6, Plasma: >3000 pg/mL — AB (ref 0.0–12.2)
Interleukin-6, Plasma: >3000 pg/mL — AB (ref 0.0–12.2)

## 2019-02-26 LAB — PHOSPHORUS: Phosphorus: 5.3 mg/dL — ABNORMAL HIGH (ref 2.5–4.6)

## 2019-02-26 LAB — C-REACTIVE PROTEIN: CRP: 16 mg/dL — ABNORMAL HIGH (ref <1.0)

## 2019-02-26 LAB — TRIGLYCERIDES: Triglycerides: 355 mg/dL — ABNORMAL HIGH (ref <150)

## 2019-02-26 MED ORDER — INSULIN GLARGINE 100 UNIT/ML ~~LOC~~ SOLN
8.0000 [IU] | Freq: Every day | SUBCUTANEOUS | Status: DC
  Administered 2019-02-26 – 2019-03-02 (×5): 8 [IU] via SUBCUTANEOUS
  Filled 2019-02-26 (×6): qty 0.08

## 2019-02-26 MED ORDER — ALTEPLASE 2 MG IJ SOLR
2.0000 mg | Freq: Once | INTRAMUSCULAR | Status: AC
  Administered 2019-02-26: 2 mg
  Filled 2019-02-26: qty 2

## 2019-02-26 MED ORDER — STERILE WATER FOR INJECTION IJ SOLN
INTRAMUSCULAR | Status: AC
  Administered 2019-02-26: 10 mL
  Filled 2019-02-26: qty 10

## 2019-02-26 MED ORDER — SENNOSIDES 8.8 MG/5ML PO SYRP
5.0000 mL | ORAL_SOLUTION | Freq: Two times a day (BID) | ORAL | Status: DC
  Administered 2019-02-26 – 2019-03-19 (×38): 5 mL via ORAL
  Filled 2019-02-26 (×42): qty 5

## 2019-02-26 MED ORDER — OXYCODONE HCL 5 MG PO TABS
10.0000 mg | ORAL_TABLET | ORAL | Status: DC
  Administered 2019-02-26 – 2019-03-12 (×81): 10 mg via ORAL
  Filled 2019-02-26 (×82): qty 2

## 2019-02-26 NOTE — Progress Notes (Signed)
RT NOTE:  Pt in prone position. RT assisted RN with head turn to left. ETT is secured.

## 2019-02-26 NOTE — Progress Notes (Signed)
[place] per for COVID with elevated d-dimer and continued clotting of CRRT  Indication: Clotting of CRRT   Patient Measurements: Height: 5\' 5"  (165.1 cm) Weight: 188 lb 15 oz (85.7 kg) IBW/kg (Calculated) : 61.5 Heparin dosing weight: 78.6  Vital Signs: Temp: 95.7 F (35.4 C) (04/24 0813) Temp Source: Rectal (04/24 0813) BP: 85/70 (04/24 0800) Pulse Rate: 51 (04/24 0800)  Labs: Recent Labs    02/24/19 0054  02/25/19 0456 02/25/19 0457 02/25/19 1216 02/25/19 1600 02/25/19 1603 02/26/19 0545  HGB 13.1   < > 12.7*  --   --   --  11.6* 11.3*  HCT 40.7   < > 39.2  --   --   --  34.0* 35.7*  PLT 152  --  145*  --   --   --   --  141*  HEPARINUNFRC  --    < >  --  0.24* 0.21*  --   --  0.13*  CREATININE 2.83*   < > 3.05*  --   --  2.97*  --  4.17*   < > = values in this interval not displayed.    Estimated Creatinine Clearance: 21.8 mL/min (A) (by C-G formula based on SCr of 4.17 mg/dL (H)).   Medical History: No past medical history on file.  Assessment: 48 yo M admitted with respiratory distress, cough and AMS found to be COVID+. D-dimers elevated on 4/21 at 11.18. Patient's CrCl ~30 with plans to start CRRT today. Pharmacy consulted to dose heparin drip due concerns for increased coagulopathy with elevated d-dimer and COVID-19 diagnosis.   Night of 4/23-4/24 patient clotted CRRT tubing- increased goal to full anticoagulation.   Anti-Xa level is currently 0.13. CBCs stable, no s/sx of bleeding documented.   Goal of Therapy:  Heparin level 0.3 - 0.7 units/mL Monitor platelets by anticoagulation protocol: Yes   Plan:  Increase heparin gtt to 1050 units/hr Anti-Xa in 8 hours Monitor daily anti-Xa levels, CBCs, and s/sx of bleeding  Isaias Sakai, Pharm D PGY1 Pharmacy Resident  Phone [phone] Please use [facility] for clinical pharmacists numbers  02/26/2019      9:03 AM

## 2019-02-26 NOTE — Progress Notes (Signed)
Per Dr. Vaughan Browner, Keep tube feeds at 68ml/hr do not increase.

## 2019-02-26 NOTE — Progress Notes (Signed)
Pt had arrived in ED when chaplain was on-call. Checked with RN to offer spiritual care in Spanish via telephone to family. Left note with NT. RN did confirm contact with family earlier today.  Please contact as needed for f/u support.  Minus Liberty, [place]

## 2019-02-26 NOTE — Progress Notes (Addendum)
NAME:  Jonathan [name], MRN:  [id number], DOB:  1971-02-11, LOS: 3 ADMISSION DATE:  02/23/2019, CONSULTATION DATE:  02/23/2019 REFERRING MD:  [facility], CHIEF COMPLAINT:  Respiratory failure   Brief History   48 year old man intubated for severe respiratory failure requiring prone ventilation and nitric oxide for ARDS related to COVID-19.  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  Past Medical History  Limited information. T2DM recently diagnosed on OHA.   Significant Hospital Events   4/21 admitted and immediately proned on inverse ratio PC ventilation with high PEEP to maintain saturation >85%, Started on NO at 40ppm. 4/22- Tocluzimab X 1 4/21-4/24 Proned daily  Consults:  PCCM 4/21  Procedures:  4/21 - ETT 7.5,  4/21 - RIJ [facility]  Significant Diagnostic Tests:  POC U/S 4/21: Bilateral interstitial pattern with bilateral dense consolidation at bases. Echo shows low-normal LVSF. RV moderately dilated with septal shift.   Micro Data:  Blood cultures -PND SARS-CoV2 - POSITIVE   Antimicrobials:  Azithromycin 4/21 >> Ceftriaxone 4/21 >> Hydroxychloroquine 4/21 > 4/22  Interim history/subjective:  Remains proned Clotting issues with CVVH overnight requiring TPA  Objective   Blood pressure (!) 85/70, pulse (!) 51, temperature (!) 95.7 F (35.4 C), temperature source Rectal, resp. rate (!) 32, height 5\' 5"  (1.651 m), weight 85.7 kg, SpO2 100 %. CVP:  [19 mmHg] 19 mmHg  Vent Mode: PRVC FiO2 (%):  [60 %-100 %] 80 % Set Rate:  [32 bmp] 32 bmp Vt Set:  [360 mL] 360 mL PEEP:  [18 cmH20] 18 cmH20 Plateau Pressure:  [28 cmH20-37 cmH20] 28 cmH20   Intake/Output Summary (Last 24 hours) at 02/26/2019 0826 Last data filed at 02/26/2019 0800 Gross per 24 hour  Intake 3262 ml  Output 1618 ml  Net 1644 ml   Filed Weights   02/24/19 2100 02/25/19 0433  02/26/19 0549  Weight: 82.5 kg 86.8 kg 85.7 kg   Examination: Gen:      No acute distress HEENT:  EOMI, sclera anicteric, ETT Neck:     No masses; no thyromegaly Lungs:    Clear to auscultation bilaterally; normal respiratory effort CV:         Regular rate and rhythm; no murmurs Abd:      + bowel sounds; soft, non-tender; no palpable masses, no distension Ext:    No edema; adequate peripheral perfusion Skin:      Warm and dry; no rash Neuro: Prone, unresponsive  Resolved Hospital Problem list     Assessment & Plan:  48 year old with severe ARDS, multiorgan failure due to COVID  Resp failure, ARDS Continue inhaled NO Back to supine later today. Follow ABG Wean down Fio2. Keep PEEP at 18 Follow ABG  Shock, RV dysfunction noted on bedside echo- Likely related to ARDS Off pressors. Continues on low dose dobutamine Continue stress dose steroids Monitor CVP and Coox  Elevated LFTs. Shock liver Monitor  Diabetes mellitus Continue lantus. Increase dose to 8 units.  AKI Monitor urine output On CVVH.  Increase fluid removal as CVP is 19 On low dose heparin gtt due to elevated d imer and CVVH > will increase to full anticoagulation as he has clotting issues of circuit  Sedation Continue fentanyl. precedex gtt. Increase oxy to 10mg  14  Best practice:  Diet: trickle feed only while prone Pain/Anxiety/Delirium protocol (if indicated): Fentanyl, predex, klonopin, Oxycodone,  PRN lorazepam  VAP protocol (if indicated): Bundle in place. DVT prophylaxis:Heparin gtt GI prophylaxis: Famotidine Glucose control: Lantus, SSI coverage. Mobility: Bedrest Code Status: Full Family Communication: Family in [place]. Was able to talk to wife and daughter through interpreter.  Have communicated with employer. Disposition: ICU.  Labs   CBC: Recent Labs  Lab 02/23/19 0946  02/23/19 1215  02/24/19 0054  02/24/19 1805 02/25/19 0421 02/25/19 0456 02/25/19 1603 02/26/19 0545  WBC  10.1  --  8.2  --  15.7*  --   --   --  15.1*  --  14.0*  NEUTROABS 8.5*  --   --   --  13.1*  --   --   --  13.1*  --  12.1*  HGB 14.7   < > 14.7   < > 13.1   < > 11.9* 11.9* 12.7* 11.6* 11.3*  HCT 44.3   < > 44.0   < > 40.7   < > 35.0* 35.0* 39.2 34.0* 35.7*  MCV 88.4  --  89.1  --  92.1  --   --   --  90.5  --  93.0  PLT 192  --  166  --  152  --   --   --  145*  --  141*   < > = values in this interval not displayed.    Basic Metabolic Panel: Recent Labs  Lab 02/24/19 0054  02/24/19 1700  02/25/19 0421 02/25/19 0456 02/25/19 1600 02/25/19 1603 02/26/19 0545  NA 140   < > 140   < > 138 140 138 137 137  K 4.5   < > 6.0*   < > 4.8 4.9 5.1 4.9 5.2*  CL 107  --  108  --   --  107 106  --  105  CO2 19*  --  19*  --   --  22 21*  --  20*  GLUCOSE 273*  --  286*  --   --  178* 192*  --  201*  BUN 26*  --  36*  --   --  31* 34*  --  55*  CREATININE 2.83*  --  3.90*  --   --  3.05* 2.97*  --  4.17*  CALCIUM 6.6*  --  6.2*  --   --  7.0* 7.3*  --  7.0*  MG 1.9  --   --   --   --  2.3  --   --  2.5*  PHOS 5.6*  --  5.6*  --   --  4.8* 5.0*  --  5.3*   < > = values in this interval not displayed.   GFR: Estimated Creatinine Clearance: 21.8 mL/min (A) (by C-G formula based on SCr of 4.17 mg/dL (H)). Recent Labs  Lab 02/23/19 0946 02/23/19 1215 02/24/19 0054 02/25/19 0456 02/26/19 0545  PROCALCITON 4.53  --   --   --   --   WBC 10.1 8.2 15.7* 15.1* 14.0*  LATICACIDVEN  --  1.5  --   --   --     Liver Function Tests: Recent Labs  Lab 02/23/19 0946 02/24/19 0054 02/24/19 1700 02/25/19 0456 02/25/19 1600 02/26/19 0545  AST 204* 176*  --  202*  --  98*  ALT 125* 100*  --  132*  --  102*  ALKPHOS 83 56  --  74  --  76  BILITOT 1.2 0.9  --  1.0  --  0.7  PROT  6.5 5.2*  --  5.3*  --  4.8*  ALBUMIN 2.5* 2.0* 1.8* 1.8* 1.8* 1.7*   No results for input(s): LIPASE, AMYLASE in the last 168 hours. No results for input(s): AMMONIA in the last 168 hours.  ABG    Component  Value Date/Time   PHART 7.265 (L) 02/26/2019 0230   PCO2ART 46.9 02/26/2019 0230   PO2ART 125 (H) 02/26/2019 0230   HCO3 19.9 (L) 02/26/2019 0230   TCO2 25 02/25/2019 1603   ACIDBASEDEF 5.5 (H) 02/26/2019 0230   O2SAT 77.1 02/26/2019 0545     Coagulation Profile: No results for input(s): INR, PROTIME in the last 168 hours.  Cardiac Enzymes: No results for input(s): CKTOTAL, CKMB, CKMBINDEX, TROPONINI in the last 168 hours.  HbA1C: No results found for: HGBA1C  CBG: Recent Labs  Lab 02/25/19 1110 02/25/19 1621 02/25/19 2047 02/25/19 2349 02/26/19 0411  GLUCAP 167* 174* 167* 170* 201*   The patient is critically ill with multiple organ system failure and requires high complexity decision making for assessment and support, frequent evaluation and titration of therapies, advanced monitoring, review of radiographic studies and interpretation of complex data.   Critical Care Time devoted to patient care services, exclusive of separately billable procedures, described in this note is 35 minutes.   Marshell Garfinkel MD [facility] Pager [phone] If no answer call 336 [phone] 02/26/2019, 8:36 AM

## 2019-02-26 NOTE — Progress Notes (Signed)
RT NOTE:  Pt in prone position. RT assisted RN with head turn to right. ETT secure.

## 2019-02-26 NOTE — Progress Notes (Signed)
Minimal oozing seen from bilateral nares on Heparin gtt. Will continue to monitor.

## 2019-02-26 NOTE — Progress Notes (Signed)
RT NOTE:  Pt in prone position. RT assisted RN with head turn to left. ETT secure.

## 2019-02-26 NOTE — Progress Notes (Signed)
RT NOTE:  Pt in prone position. RT assisted in head turn to left. ETT secure.

## 2019-02-26 NOTE — Progress Notes (Addendum)
[facility] Progress Note  Name: Jonathan [name] MRN: [id number] DOB: 1971-08-07  Chief Complaint:  Shortness of breath   Subjective:  Spoke with bedside nurse.  CRRT was off most of the night due to clotting.  Running well now after TPA x2.  UF was 1.1 for 4/23.  Tolerated fluid removal from a pressure standpoint but had issues during the day when they tried to place him supine.  Tolerating net neg 50 an hour so far.  He has been on dobutamine.  Anuric.    Review of systems:  Unable to obtain 2/2 mechanical vent; covid  -------------  Background on consult:   Jonathan [name] is a 48 y.o. male with a history of recently diagnosed diabetes who presented to the hospital with respiratory distress and altered mental status.  He was intubated and proned and found to be COVID-19 positive.  He has been anuric.  Critical care has consulted requesting CRRT.  His primary team has spoken with his family in [place] via an interpreter and they have indicated that they would want aggressive measures such as dialysis.  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  He has been on levophed and vasopressin with downtrending levophed requirements; previously maxed on levo.  Dobutamine is being added per nursing report.   Intake/Output Summary (Last 24 hours) at 02/26/2019 0531 Last data filed at 02/26/2019 0500 Gross per 24 hour  Intake 3273.78 ml  Output 1326 ml  Net 1947.78 ml    Vitals:  Vitals:   02/26/19 0230 02/26/19 0300 02/26/19 0400 02/26/19 0500  BP: (!) 102/55     Pulse: 76 83 74 67  Resp: (!) 32 (!) 32 (!) 32 (!) 32  Temp:   (!) 102 F (38.9 C) (!) 100.5 F (38.1 C)  TempSrc:   Rectal Rectal  SpO2: 95% 100% 100% 100%  Weight:      Height:         Physical Exam:  Due to the nature of this patient's suspected COVID-19 with isolation and in keeping with efforts to prevent the spread of infection and to conserve personal protective equipment, a physical  exam was not personally performed.  Patient's exam were discussed in detail with the RN.  A chart review of other providers notes and the patient's lab work as well as review of other pertinent studies was performed.   Location of service: [facility]   Per nursing:  General adult male in bed prone on vent HEENT normocephalic atraumatic  Lungs clear but diminished  Heart regular rate and rhythm  Abdomen soft nontender nondistended  Extremities 1-2+ edema extremities  Access: left IJ vascath intact per nursing   Medications reviewed   Labs:  BMP Latest Ref Rng & Units 02/25/2019 02/25/2019 02/25/2019  Glucose 70 - 99 mg/dL - 192(H) 178(H)  BUN 6 - 20 mg/dL - 34(H) 31(H)  Creatinine 0.61 - 1.24 mg/dL - 2.97(H) 3.05(H)  Sodium 135 - 145 mmol/L 137 138 140  Potassium 3.5 - 5.1 mmol/L 4.9 5.1 4.9  Chloride 98 - 111 mmol/L - 106 107  CO2 22 - 32 mmol/L - 21(L) 22  Calcium 8.9 - 10.3 mg/dL - 7.3(L) 7.0(L)     Assessment/Plan:   # AKI  - Secondary to ATN in the setting of shock, COVID19 - Continue CRRT.  goal of negative 50 mL/hr as tolerated - Will follow AM labs - to be drawn now  # COVID - 19 positive  -  per pulmonology   # Acute hypoxic respiratory failure - Intubated and proned  - 2/2 COVID 19 with ARDS - CRRT as above to optimize volume status    # Hyperkalemia - Resolved with CRRT   # Acute metabolic acidosis - Continue CRRT   # Septic shock - Off of levophed and vaso; on dobutamine   Claudia Desanctis, MD 02/26/2019 5:31 AM

## 2019-02-26 NOTE — Progress Notes (Signed)
ANTICOAGULATION CONSULT NOTE  Pharmacy Consult per for heparin Indication: COVID+ with elevated d-dimer and continued clotting of CRRT   Patient Measurements: Height: 5\' 5"  (165.1 cm) Weight: 188 lb 15 oz (85.7 kg) IBW/kg (Calculated) : 61.5 Heparin dosing weight: 78.6  Vital Signs: Temp: 97.8 F (36.6 C) (04/24 1730) Temp Source: Rectal (04/24 1730) BP: 96/65 (04/24 1730) Pulse Rate: 79 (04/24 1730)  Labs: Recent Labs    02/24/19 0054  02/25/19 0456 02/25/19 0457 02/25/19 1216 02/25/19 1600 02/25/19 1603 02/26/19 0545 02/26/19 1627 02/26/19 1648  HGB 13.1   < > 12.7*  --   --   --  11.6* 11.3* 11.2*  --   HCT 40.7   < > 39.2  --   --   --  34.0* 35.7* 33.0*  --   PLT 152  --  145*  --   --   --   --  141*  --   --   HEPARINUNFRC  --    < >  --  0.24* 0.21*  --   --  0.13*  --   --   CREATININE 2.83*   < > 3.05*  --   --  2.97*  --  4.17*  --  3.49*   < > = values in this interval not displayed.    Estimated Creatinine Clearance: 26.1 mL/min (A) (by C-G formula based on SCr of 3.49 mg/dL (H)).   Assessment: 48 yo M admitted with respiratory distress, cough and AMS found to be COVID+. D-dimers elevated on 4/21 at 11.18. CRRT now started. Pharmacy consulted to dose heparin drip due concerns for increased coagulopathy with elevated d-dimer and COVID-19 diagnosis.   Night of 4/23-4/24 - patient clotted CRRT tubing >> increased goal to full anticoagulation.   Heparin level tonight subtherapeutic at 0.26. CBC stable. No active major s/sx of bleeding reported, but minimal oozing seen from bilateral nares earlier today - not noted to be worsening per RN tonight.  Goal of Therapy:  Heparin level 0.3 - 0.7 units/mL Monitor platelets by anticoagulation protocol: Yes   Plan:  Increase heparin gtt to 1150 units/hr Anti-Xa in 8 hours Monitor daily anti-Xa levels, CBCs, and s/sx of bleeding  Elicia Lamp, PharmD, BCPS Please check AMION for all [place] contact  numbers Clinical Pharmacist 02/26/2019 6:14 PM

## 2019-02-27 ENCOUNTER — Inpatient Hospital Stay (HOSPITAL_COMMUNITY): Payer: HRSA Program

## 2019-02-27 DIAGNOSIS — J9601 Acute respiratory failure with hypoxia: Secondary | ICD-10-CM

## 2019-02-27 DIAGNOSIS — J9602 Acute respiratory failure with hypercapnia: Secondary | ICD-10-CM

## 2019-02-27 LAB — CBC WITH DIFFERENTIAL/PLATELET
Abs Immature Granulocytes: 0.62 10*3/uL — ABNORMAL HIGH (ref 0.00–0.07)
Basophils Absolute: 0 10*3/uL (ref 0.0–0.1)
Basophils Relative: 0 %
Eosinophils Absolute: 0 10*3/uL (ref 0.0–0.5)
Eosinophils Relative: 0 %
HCT: 35.8 % — ABNORMAL LOW (ref 39.0–52.0)
Hemoglobin: 11.6 g/dL — ABNORMAL LOW (ref 13.0–17.0)
Immature Granulocytes: 4 %
Lymphocytes Relative: 10 %
Lymphs Abs: 1.5 10*3/uL (ref 0.7–4.0)
MCH: 29.4 pg (ref 26.0–34.0)
MCHC: 32.4 g/dL (ref 30.0–36.0)
MCV: 90.9 fL (ref 80.0–100.0)
Monocytes Absolute: 0.3 10*3/uL (ref 0.1–1.0)
Monocytes Relative: 2 %
Neutro Abs: 11.9 10*3/uL — ABNORMAL HIGH (ref 1.7–7.7)
Neutrophils Relative %: 84 %
Platelets: 115 10*3/uL — ABNORMAL LOW (ref 150–400)
RBC: 3.94 MIL/uL — ABNORMAL LOW (ref 4.22–5.81)
RDW: 14 % (ref 11.5–15.5)
WBC: 14.3 10*3/uL — ABNORMAL HIGH (ref 4.0–10.5)
nRBC: 1.3 % — ABNORMAL HIGH (ref 0.0–0.2)

## 2019-02-27 LAB — BLOOD GAS, ARTERIAL
Acid-base deficit: 2.2 mmol/L — ABNORMAL HIGH (ref 0.0–2.0)
Bicarbonate: 22.7 mmol/L (ref 20.0–28.0)
Drawn by: 36496
FIO2: 70
MECHVT: 360 mL
Nitric Oxide: 40
O2 Saturation: 98 %
PEEP: 18 cmH2O
Patient temperature: 99.1
RATE: 32 resp/min
pCO2 arterial: 43.6 mmHg (ref 32.0–48.0)
pH, Arterial: 7.338 — ABNORMAL LOW (ref 7.350–7.450)
pO2, Arterial: 122 mmHg — ABNORMAL HIGH (ref 83.0–108.0)

## 2019-02-27 LAB — COMPREHENSIVE METABOLIC PANEL
ALT: 95 U/L — ABNORMAL HIGH (ref 0–44)
AST: 86 U/L — ABNORMAL HIGH (ref 15–41)
Albumin: 1.9 g/dL — ABNORMAL LOW (ref 3.5–5.0)
Alkaline Phosphatase: 85 U/L (ref 38–126)
Anion gap: 11 (ref 5–15)
BUN: 44 mg/dL — ABNORMAL HIGH (ref 6–20)
CO2: 23 mmol/L (ref 22–32)
Calcium: 7.2 mg/dL — ABNORMAL LOW (ref 8.9–10.3)
Chloride: 102 mmol/L (ref 98–111)
Creatinine, Ser: 3.16 mg/dL — ABNORMAL HIGH (ref 0.61–1.24)
GFR calc Af Amer: 26 mL/min — ABNORMAL LOW (ref >60)
GFR calc non Af Amer: 22 mL/min — ABNORMAL LOW (ref >60)
Glucose, Bld: 124 mg/dL — ABNORMAL HIGH (ref 70–99)
Potassium: 4.8 mmol/L (ref 3.5–5.1)
Sodium: 136 mmol/L (ref 135–145)
Total Bilirubin: 0.7 mg/dL (ref 0.3–1.2)
Total Protein: 4.9 g/dL — ABNORMAL LOW (ref 6.5–8.1)

## 2019-02-27 LAB — RENAL FUNCTION PANEL
Albumin: 2 g/dL — ABNORMAL LOW (ref 3.5–5.0)
Anion gap: 11 (ref 5–15)
BUN: 40 mg/dL — ABNORMAL HIGH (ref 6–20)
CO2: 21 mmol/L — ABNORMAL LOW (ref 22–32)
Calcium: 7.3 mg/dL — ABNORMAL LOW (ref 8.9–10.3)
Chloride: 102 mmol/L (ref 98–111)
Creatinine, Ser: 3.02 mg/dL — ABNORMAL HIGH (ref 0.61–1.24)
GFR calc Af Amer: 27 mL/min — ABNORMAL LOW (ref >60)
GFR calc non Af Amer: 23 mL/min — ABNORMAL LOW (ref >60)
Glucose, Bld: 207 mg/dL — ABNORMAL HIGH (ref 70–99)
Phosphorus: 4.3 mg/dL (ref 2.5–4.6)
Potassium: 5 mmol/L (ref 3.5–5.1)
Sodium: 134 mmol/L — ABNORMAL LOW (ref 135–145)

## 2019-02-27 LAB — COOXEMETRY PANEL
Carboxyhemoglobin: 0.8 % (ref 0.5–1.5)
Methemoglobin: 2.4 % — ABNORMAL HIGH (ref 0.0–1.5)
O2 Saturation: 85.8 %
Total hemoglobin: 12 g/dL (ref 12.0–16.0)

## 2019-02-27 LAB — HEPARIN LEVEL (UNFRACTIONATED)
Heparin Unfractionated: 0.3 IU/mL (ref 0.30–0.70)
Heparin Unfractionated: 0.5 IU/mL (ref 0.30–0.70)

## 2019-02-27 LAB — POCT I-STAT 7, (LYTES, BLD GAS, ICA,H+H)
Acid-base deficit: 2 mmol/L (ref 0.0–2.0)
Bicarbonate: 23.7 mmol/L (ref 20.0–28.0)
Calcium, Ion: 1.05 mmol/L — ABNORMAL LOW (ref 1.15–1.40)
HCT: 31 % — ABNORMAL LOW (ref 39.0–52.0)
Hemoglobin: 10.5 g/dL — ABNORMAL LOW (ref 13.0–17.0)
O2 Saturation: 92 %
Patient temperature: 97.6
Potassium: 4.9 mmol/L (ref 3.5–5.1)
Sodium: 133 mmol/L — ABNORMAL LOW (ref 135–145)
TCO2: 25 mmol/L (ref 22–32)
pCO2 arterial: 41 mmHg (ref 32.0–48.0)
pH, Arterial: 7.368 (ref 7.350–7.450)
pO2, Arterial: 64 mmHg — ABNORMAL LOW (ref 83.0–108.0)

## 2019-02-27 LAB — FERRITIN: Ferritin: 1227 ng/mL — ABNORMAL HIGH (ref 24–336)

## 2019-02-27 LAB — PHOSPHORUS: Phosphorus: 4.2 mg/dL (ref 2.5–4.6)

## 2019-02-27 LAB — MAGNESIUM: Magnesium: 2.6 mg/dL — ABNORMAL HIGH (ref 1.7–2.4)

## 2019-02-27 LAB — GLUCOSE, CAPILLARY
Glucose-Capillary: 125 mg/dL — ABNORMAL HIGH (ref 70–99)
Glucose-Capillary: 99 mg/dL (ref 70–99)
Glucose-Capillary: 92 mg/dL (ref 70–99)
Glucose-Capillary: 184 mg/dL — ABNORMAL HIGH (ref 70–99)
Glucose-Capillary: 154 mg/dL — ABNORMAL HIGH (ref 70–99)

## 2019-02-27 LAB — TRIGLYCERIDES: Triglycerides: 292 mg/dL — ABNORMAL HIGH (ref <150)

## 2019-02-27 LAB — C-REACTIVE PROTEIN: CRP: 8.4 mg/dL — ABNORMAL HIGH (ref <1.0)

## 2019-02-27 MED ORDER — VECURONIUM BROMIDE 10 MG IV SOLR
10.0000 mg | INTRAVENOUS | Status: DC | PRN
  Administered 2019-02-27 – 2019-03-01 (×7): 10 mg via INTRAVENOUS
  Filled 2019-02-27 (×7): qty 10

## 2019-02-27 MED ORDER — HYDROCORTISONE NA SUCCINATE PF 100 MG IJ SOLR: 25.0000 mg | Freq: Once | INTRAMUSCULAR | Status: DC

## 2019-02-27 MED ORDER — HYDROCORTISONE NA SUCCINATE PF 100 MG IJ SOLR
25.0000 mg | Freq: Two times a day (BID) | INTRAMUSCULAR | Status: DC
  Administered 2019-03-02: 08:00:00 25 mg via INTRAVENOUS
  Filled 2019-02-27: qty 2

## 2019-02-27 MED ORDER — HYDROCORTISONE NA SUCCINATE PF 100 MG IJ SOLR
50.0000 mg | Freq: Two times a day (BID) | INTRAMUSCULAR | Status: AC
  Administered 2019-02-27 – 2019-03-01 (×5): 50 mg via INTRAVENOUS
  Filled 2019-02-27 (×5): qty 2

## 2019-02-27 MED ORDER — WHITE PETROLATUM EX OINT
TOPICAL_OINTMENT | CUTANEOUS | Status: AC
  Administered 2019-02-27: 0.2
  Filled 2019-02-27: qty 28.35

## 2019-02-27 NOTE — Progress Notes (Signed)
RT NOTE:  Pt in prone position. RT assisted RN with head turn to right. ETT secure.

## 2019-02-27 NOTE — Progress Notes (Signed)
NAME:  Jonathan [name], MRN:  [id number], DOB:  1971-03-07, LOS: 4 ADMISSION DATE:  02/23/2019, CONSULTATION DATE:  02/23/2019 REFERRING MD:  [facility], CHIEF COMPLAINT:  Respiratory failure   Brief History   48 year old man intubated for severe respiratory failure requiring prone ventilation and nitric oxide for ARDS related to COVID-19.  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  Past Medical History  Limited information. T2DM recently diagnosed on OHA.   Significant Hospital Events   4/21 admitted and immediately proned on inverse ratio PC ventilation with high PEEP to maintain saturation >85%, Started on NO at 40ppm. 4/22- Tocluzimab X 1 4/21-4/24 Proned daily  Consults:  PCCM 4/21  Procedures:  4/21 - ETT 7.5,  4/21 - RIJ CVC [facility]  Significant Diagnostic Tests:  POC U/S 4/21: Bilateral interstitial pattern with bilateral dense consolidation at bases. Echo shows low-normal LVSF. RV moderately dilated with septal shift.   Micro Data:  Blood cultures -PND SARS-CoV2 - POSITIVE   Antimicrobials:  Azithromycin 4/21 >> Ceftriaxone 4/21 >> Hydroxychloroquine 4/21 > 4/22  Interim history/subjective:  Tried supine today again. Increased ventilator dyssynchrony and Fio2 requirements.  When prone, has remained largely on his right side to ensure sufficient dialysis flow.  Objective   Blood pressure 105/71, pulse 61, temperature 98.3 F (36.8 C), temperature source Rectal, resp. rate (!) 23, height 5\' 5"  (1.651 m), weight 84.7 kg, SpO2 90 %. CVP:  [11 mmHg-68 mmHg] 62 mmHg  Vent Mode: PRVC FiO2 (%):  [50 %-80 %] 80 % Set Rate:  [15 bmp-32 bmp] 15 bmp Vt Set:  [360 mL] 360 mL PEEP:  [18 cmH20] 18 cmH20 Plateau Pressure:  [32 cmH20-36 cmH20] 36 cmH20   Intake/Output Summary (Last 24 hours) at 02/27/2019 1609 Last data filed at 02/27/2019 1600 Gross per 24  hour  Intake 3058.93 ml  Output 5048 ml  Net -1989.07 ml   Filed Weights   02/25/19 0433 02/26/19 0549 02/27/19 0426  Weight: 86.8 kg 85.7 kg 84.7 kg   Examination: Gen:      Moderately overweight. Sedated.  HEENT:  ETT and OGT in place. Small upper lip ulceration.  Right periorbital swelling Lungs:    Clear anteriorly.  Crackles at bases.  Increased respiratory effort with ventilator dyssynchrony when supine.  Some improvement with decrease in respiratory rate.  Desaturation with drop in PEEP to 16. CV:         Regular rate and rhythm; no murmurs Abd:      + bowel sounds; soft, non-tender; no palpable masses, no distension Ext: Mild edema edema; adequate peripheral perfusion Skin:      Warm and dry; no rash Neuro: Unresponsive to verbal stimulation.  Resolved Hospital Problem list     Assessment & Plan:  48 year old with severe ARDS, multiorgan failure due to COVID  Critically ill due to acute hypoxic and hypercarbic respiratory failure due to ARDS from COVID-19, requiring mechanical ventilation. Has responded well to prone ventilation. Increasing oxygen requirements when supine. Resume prone ventilation. Daily trials of supine ventilation. Gradual PEEP wean. Likely will not tolerate resumption of supine ventilation until PEEP is below 15. Continue inhaled NO  Critically ill due to cardiogenic shock due to acute cor pulmonale. RV dysfunction noted on bedside echo- related to ARDS Off pressors. Continues on low dose dobutamine We will taper stress steroids now that he is off norepinephrine Continue low-dose  dobutamine indefinitely until respiratory failure resolves Continue fluid removal.  Diabetes mellitus Titrate insulin as necessary to achieve adequate glycemic control  AKI Monitor urine output On CVVH.  Continue fluid removal as tolerated On low dose heparin gtt due to elevated d imer and CVVH > will increase to full anticoagulation as he has clotting issues of  circuit   Best practice:  Diet: trickle feed only while prone Pain/Anxiety/Delirium protocol (if indicated): Fentanyl, predex, klonopin, Oxycodone,  PRN lorazepam VAP protocol (if indicated): Bundle in place. DVT prophylaxis:Heparin gtt GI prophylaxis: Famotidine Glucose control: Lantus, SSI coverage. Mobility: Bedrest Code Status: Full Family Communication: Family in [place].  I updated his spouse over the phone. Disposition: ICU.  Labs   CBC: Recent Labs  Lab 02/23/19 0946  02/23/19 1215  02/24/19 0054  02/25/19 0456 02/25/19 1603 02/26/19 0545 02/26/19 1627 02/27/19 0331 02/27/19 1547  WBC 10.1  --  8.2  --  15.7*  --  15.1*  --  14.0*  --  14.3*  --   NEUTROABS 8.5*  --   --   --  13.1*  --  13.1*  --  12.1*  --  11.9*  --   HGB 14.7   < > 14.7   < > 13.1   < > 12.7* 11.6* 11.3* 11.2* 11.6* 10.5*  HCT 44.3   < > 44.0   < > 40.7   < > 39.2 34.0* 35.7* 33.0* 35.8* 31.0*  MCV 88.4  --  89.1  --  92.1  --  90.5  --  93.0  --  90.9  --   PLT 192  --  166  --  152  --  145*  --  141*  --  115*  --    < > = values in this interval not displayed.    Basic Metabolic Panel: Recent Labs  Lab 02/24/19 0054  02/25/19 0456 02/25/19 1600  02/26/19 0545 02/26/19 1627 02/26/19 1648 02/27/19 0331 02/27/19 1527 02/27/19 1547  NA 140   < > 140 138   < > 137 137 139 136 134* 133*  K 4.5   < > 4.9 5.1   < > 5.2* 4.8 4.8 4.8 5.0 4.9  CL 107   < > 107 106  --  105  --  105 102 102  --   CO2 19*   < > 22 21*  --  20*  --  22 23 21*  --   GLUCOSE 273*   < > 178* 192*  --  201*  --  152* 124* 207*  --   BUN 26*   < > 31* 34*  --  55*  --  45* 44* 40*  --   CREATININE 2.83*   < > 3.05* 2.97*  --  4.17*  --  3.49* 3.16* 3.02*  --   CALCIUM 6.6*   < > 7.0* 7.3*  --  7.0*  --  7.3* 7.2* 7.3*  --   MG 1.9  --  2.3  --   --  2.5*  --   --  2.6*  --   --   PHOS 5.6*   < > 4.8* 5.0*  --  5.3*  --  4.7* 4.2 4.3  --    < > = values in this interval not displayed.   GFR: Estimated  Creatinine Clearance: 30 mL/min (A) (by C-G formula based on SCr of 3.02 mg/dL (H)). Recent Labs  Lab 02/23/19 [phone] 02/23/19  1215 02/24/19 0054 02/25/19 0456 02/26/19 0545 02/27/19 0331  PROCALCITON 4.53  --   --   --   --   --   WBC 10.1 8.2 15.7* 15.1* 14.0* 14.3*  LATICACIDVEN  --  1.5  --   --   --   --     Liver Function Tests: Recent Labs  Lab 02/23/19 0946 02/24/19 0054  02/25/19 0456 02/25/19 1600 02/26/19 0545 02/26/19 1648 02/27/19 0331 02/27/19 1527  AST 204* 176*  --  202*  --  98*  --  86*  --   ALT 125* 100*  --  132*  --  102*  --  95*  --   ALKPHOS 83 56  --  74  --  76  --  85  --   BILITOT 1.2 0.9  --  1.0  --  0.7  --  0.7  --   PROT 6.5 5.2*  --  5.3*  --  4.8*  --  4.9*  --   ALBUMIN 2.5* 2.0*   < > 1.8* 1.8* 1.7* 1.9* 1.9* 2.0*   < > = values in this interval not displayed.   No results for input(s): LIPASE, AMYLASE in the last 168 hours. No results for input(s): AMMONIA in the last 168 hours.  ABG    Component Value Date/Time   PHART 7.368 02/27/2019 1547   PCO2ART 41.0 02/27/2019 1547   PO2ART 64.0 (L) 02/27/2019 1547   HCO3 23.7 02/27/2019 1547   TCO2 25 02/27/2019 1547   ACIDBASEDEF 2.0 02/27/2019 1547   O2SAT 92.0 02/27/2019 1547     Coagulation Profile: No results for input(s): INR, PROTIME in the last 168 hours.  Cardiac Enzymes: No results for input(s): CKTOTAL, CKMB, CKMBINDEX, TROPONINI in the last 168 hours.  HbA1C: No results found for: HGBA1C  CBG: Recent Labs  Lab 02/26/19 2309 02/27/19 0316 02/27/19 0825 02/27/19 1112 02/27/19 1516  GLUCAP 133* 125* 99 92 184*   The patient is critically ill with multiple organ system failure and requires high complexity decision making for assessment and support, frequent evaluation and titration of therapies, advanced monitoring, review of radiographic studies and interpretation of complex data.   Critical Care Time devoted to patient care services, exclusive of separately  billable procedures, described in this note is 40 minutes.   Kipp Brood, MD [facility] ICU Physician [place]  Pager: [phone] Mobile: [phone] After hours: [phone].  02/27/2019, 4:19 PM      02/27/2019, 4:09 PM

## 2019-02-27 NOTE — Progress Notes (Signed)
[facility] Progress Note  Name: Jonathan [name] MRN: [id number] DOB: 30-May-1971  Chief Complaint:  Shortness of breath   Subjective:  Spoke with bedside RN.  Patient has continued on CRRT.  No clotting but access pressures high and seem to be positional.  He had 4.5 liters and has been removing net negative 100 ml/hr.   Per report was increased from 50 to 100 ml/hr removal yesterday afternoon.  Tolerating increased goal well.  He has had worsening status with trials of being supine so he is still prone.  He has continued on dobutamine.  Anuric. He has had some intermittent periods where he has opened his eyes per nursing.  Review of systems:  Unable to obtain 2/2 mechanical vent; covid  -------------  Background on consult:   [place] Jonathan [name] is a 48 y.o. male with a history of recently diagnosed diabetes who presented to the hospital with respiratory distress and altered mental status.  He was intubated and proned and found to be COVID-19 positive.  He has been anuric.  Critical care has consulted requesting CRRT.  His primary team has spoken with his family in [place] via an interpreter and they have indicated that they would want aggressive measures such as dialysis.  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  He has been on levophed and vasopressin with downtrending levophed requirements; previously maxed on levo.  Dobutamine is being added per nursing report.   Intake/Output Summary (Last 24 hours) at 02/27/2019 0545 Last data filed at 02/27/2019 0500 Gross per 24 hour  Intake 3083.83 ml  Output 4827 ml  Net -1743.17 ml    Vitals:  Vitals:   02/27/19 0300 02/27/19 0400 02/27/19 0426 02/27/19 0500  BP: 104/71 90/63  (!) 89/66  Pulse: 72 69  67  Resp: (!) 32 (!) 32  (!) 32  Temp: 98.9 F (37.2 C) 98.9 F (37.2 C)    TempSrc: Rectal Rectal    SpO2: 97% 99%  99%  Weight:   84.7 kg   Height:         Physical Exam:  Due to the nature of this  patient's suspected COVID-19 with isolation and in keeping with efforts to prevent the spread of infection and to conserve personal protective equipment, a physical exam was not personally performed.  Patient's exam were discussed in detail with the RN.  A chart review of other providers notes and the patient's lab work as well as review of other pertinent studies was performed.   Location of service: [facility]   Per nursing:  General adult male intubated and prone HEENT normocephalic atraumatic  Lungs clear but diminished  Heart RRR Abdomen soft nontender nondistended  Extremities non-pitting diffuse edema Access: left IJ vascath intact per nursing   Medications reviewed   Labs:  BMP Latest Ref Rng & Units 02/26/2019 02/26/2019 02/26/2019  Glucose 70 - 99 mg/dL 152(H) - 201(H)  BUN 6 - 20 mg/dL 45(H) - 55(H)  Creatinine 0.61 - 1.24 mg/dL 3.49(H) - 4.17(H)  Sodium 135 - 145 mmol/L 139 137 137  Potassium 3.5 - 5.1 mmol/L 4.8 4.8 5.2(H)  Chloride 98 - 111 mmol/L 105 - 105  CO2 22 - 32 mmol/L 22 - 20(L)  Calcium 8.9 - 10.3 mg/dL 7.3(L) - 7.0(L)     Assessment/Plan:   # AKI  - Secondary to ATN in the setting of shock, COVID19 - Continue CRRT.  Increased goal to net negative 50-100 mL/hr as  tolerated - Will follow AM labs - not back yet  # COVID - 19 positive  - per pulmonology/critical care  # Acute hypoxic respiratory failure - Intubated and proned  - 2/2 COVID 19 with ARDS - CRRT as above to optimize volume status    # Hyperkalemia - Resolved with CRRT   # Acute metabolic acidosis - Continue CRRT   # Septic shock - Off of levophed and vaso; on dobutamine   Claudia Desanctis, MD 02/27/2019 5:45 AM

## 2019-02-27 NOTE — Progress Notes (Signed)
[place] per for heparin Indication: COVID+ with elevated d-dimer and continued clotting of CRRT   Patient Measurements: Height: 5\' 5"  (165.1 cm) Weight: 186 lb 11.7 oz (84.7 kg) IBW/kg (Calculated) : 61.5 Heparin dosing weight: 78.6  Vital Signs: Temp: 97.6 F (36.4 C) (04/25 1200) Temp Source: Rectal (04/25 1200) BP: 109/75 (04/25 1300) Pulse Rate: 61 (04/25 1300)  Labs: Recent Labs    02/25/19 0456  02/26/19 0545 02/26/19 1627 02/26/19 1648 02/26/19 1737 02/27/19 0324 02/27/19 0331 02/27/19 1320  HGB 12.7*   < > 11.3* 11.2*  --   --   --  11.6*  --   HCT 39.2   < > 35.7* 33.0*  --   --   --  35.8*  --   PLT 145*  --  141*  --   --   --   --  115*  --   HEPARINUNFRC  --    < > 0.13*  --   --  0.26* 0.30  --  0.50  CREATININE 3.05*   < > 4.17*  --  3.49*  --   --  3.16*  --    < > = values in this interval not displayed.     Assessment: 48 yo M admitted with respiratory distress, cough and AMS found to be COVID+. D-dimers elevated on 4/21 at 11.18. CRRT now started. Pharmacy consulted to dose heparin drip due concerns for increased coagulopathy with elevated d-dimer and COVID-19 diagnosis.   Heparin level is therapeutic.  Goal of Therapy:  Heparin level 0.3 - 0.7 units/mL Monitor platelets by anticoagulation protocol: Yes   Plan:  Continue heparin gtt at 1250 units/hr in attempts to prevent heparin level from dropping below goal Monitor daily anti-Xa levels, CBCs, and s/sx of bleeding   Harvel Quale 02/27/2019 2:01 PM

## 2019-02-27 NOTE — Progress Notes (Signed)
Upon assessment, patient was desynchronous on the vent. O2 Sats 87% on 90% FiO2. ELink notified. Vecuronium PRN was ordered. After administering, patient showed instant vent synchrony. 02 Sats recovered to 97%. RRT decreased FiO2 requirements to 80%. In addition, the CRRT pressures improved after administering the paralytic. Will continue to monitor.

## 2019-02-27 NOTE — Progress Notes (Signed)
[place] per for heparin Indication: COVID+ with elevated d-dimer and continued clotting of CRRT   Patient Measurements: Height: 5\' 5"  (165.1 cm) Weight: 188 lb 15 oz (85.7 kg) IBW/kg (Calculated) : 61.5 Heparin dosing weight: 78.6  Vital Signs: Temp: 98.9 F (37.2 C) (04/25 0400) Temp Source: Rectal (04/25 0400) BP: 90/63 (04/25 0400) Pulse Rate: 69 (04/25 0400)  Labs: Recent Labs    02/25/19 0456  02/25/19 1600 02/25/19 1603 02/26/19 0545 02/26/19 1627 02/26/19 1648 02/26/19 1737 02/27/19 0324  HGB 12.7*  --   --  11.6* 11.3* 11.2*  --   --   --   HCT 39.2  --   --  34.0* 35.7* 33.0*  --   --   --   PLT 145*  --   --   --  141*  --   --   --   --   HEPARINUNFRC  --    < >  --   --  0.13*  --   --  0.26* 0.30  CREATININE 3.05*  --  2.97*  --  4.17*  --  3.49*  --   --    < > = values in this interval not displayed.    Estimated Creatinine Clearance: 26.1 mL/min (A) (by C-G formula based on SCr of 3.49 mg/dL (H)).  Assessment: 48 yo M admitted with respiratory distress, cough and AMS found to be COVID+. D-dimers elevated on 4/21 at 11.18. CRRT now started. Pharmacy consulted to dose heparin drip due concerns for increased coagulopathy with elevated d-dimer and COVID-19 diagnosis.   Night of 4/23-4/24 - patient clotted CRRT tubing >> increased goal to full anticoagulation.   Heparin level tonight at the lower end of therapeutic at 0.3. H/H slight improvement from yesterday; platelets down from 141 to 115. Minimal oozing noted on 4/24 but RN states this has resolved and there is no bleeding currently.  Goal of Therapy:  Heparin level 0.3 - 0.7 units/mL Monitor platelets by anticoagulation protocol: Yes   Plan:  Increase heparin gtt to 1250 units/hr in attempts to prevent heparin level from dropping below goal Repeat anti-Xa in 8 hours Monitor daily anti-Xa levels, CBCs, and s/sx of bleeding  Vertis Kelch, PharmD PGY1  Pharmacy Resident Phone [phone] 02/27/2019       4:13 AM

## 2019-02-28 DIAGNOSIS — L8990 Pressure ulcer of unspecified site, unspecified stage: Secondary | ICD-10-CM

## 2019-02-28 LAB — POCT I-STAT 7, (LYTES, BLD GAS, ICA,H+H)
Acid-base deficit: 1 mmol/L (ref 0.0–2.0)
Bicarbonate: 26.7 mmol/L (ref 20.0–28.0)
Calcium, Ion: 1.17 mmol/L (ref 1.15–1.40)
HCT: 32 % — ABNORMAL LOW (ref 39.0–52.0)
Hemoglobin: 10.9 g/dL — ABNORMAL LOW (ref 13.0–17.0)
O2 Saturation: 85 %
Patient temperature: 97.2
Potassium: 4.2 mmol/L (ref 3.5–5.1)
Sodium: 135 mmol/L (ref 135–145)
TCO2: 28 mmol/L (ref 22–32)
pCO2 arterial: 58.2 mmHg — ABNORMAL HIGH (ref 32.0–48.0)
pH, Arterial: 7.265 — ABNORMAL LOW (ref 7.350–7.450)
pO2, Arterial: 56 mmHg — ABNORMAL LOW (ref 83.0–108.0)

## 2019-02-28 LAB — RENAL FUNCTION PANEL
Albumin: 2.6 g/dL — ABNORMAL LOW (ref 3.5–5.0)
Anion gap: 9 (ref 5–15)
BUN: 34 mg/dL — ABNORMAL HIGH (ref 6–20)
CO2: 24 mmol/L (ref 22–32)
Calcium: 7.9 mg/dL — ABNORMAL LOW (ref 8.9–10.3)
Chloride: 104 mmol/L (ref 98–111)
Creatinine, Ser: 2.86 mg/dL — ABNORMAL HIGH (ref 0.61–1.24)
GFR calc Af Amer: 29 mL/min — ABNORMAL LOW (ref >60)
GFR calc non Af Amer: 25 mL/min — ABNORMAL LOW (ref >60)
Glucose, Bld: 94 mg/dL (ref 70–99)
Phosphorus: 4.2 mg/dL (ref 2.5–4.6)
Potassium: 4.9 mmol/L (ref 3.5–5.1)
Sodium: 137 mmol/L (ref 135–145)

## 2019-02-28 LAB — CBC WITH DIFFERENTIAL/PLATELET
Abs Immature Granulocytes: 1.26 10*3/uL — ABNORMAL HIGH (ref 0.00–0.07)
Basophils Absolute: 0.1 10*3/uL (ref 0.0–0.1)
Basophils Relative: 0 %
Eosinophils Absolute: 0 10*3/uL (ref 0.0–0.5)
Eosinophils Relative: 0 %
HCT: 38.7 % — ABNORMAL LOW (ref 39.0–52.0)
Hemoglobin: 12 g/dL — ABNORMAL LOW (ref 13.0–17.0)
Immature Granulocytes: 10 %
Lymphocytes Relative: 12 %
Lymphs Abs: 1.5 10*3/uL (ref 0.7–4.0)
MCH: 29 pg (ref 26.0–34.0)
MCHC: 31 g/dL (ref 30.0–36.0)
MCV: 93.5 fL (ref 80.0–100.0)
Monocytes Absolute: 0.3 10*3/uL (ref 0.1–1.0)
Monocytes Relative: 2 %
Neutro Abs: 9.3 10*3/uL — ABNORMAL HIGH (ref 1.7–7.7)
Neutrophils Relative %: 76 %
Platelets: 144 10*3/uL — ABNORMAL LOW (ref 150–400)
RBC: 4.14 MIL/uL — ABNORMAL LOW (ref 4.22–5.81)
RDW: 14.2 % (ref 11.5–15.5)
WBC: 12.4 10*3/uL — ABNORMAL HIGH (ref 4.0–10.5)
nRBC: 1.1 % — ABNORMAL HIGH (ref 0.0–0.2)

## 2019-02-28 LAB — CULTURE, BLOOD (ROUTINE X 2)
Culture: NO GROWTH
Culture: NO GROWTH
Special Requests: ADEQUATE
Special Requests: ADEQUATE

## 2019-02-28 LAB — COMPREHENSIVE METABOLIC PANEL
ALT: 94 U/L — ABNORMAL HIGH (ref 0–44)
AST: 89 U/L — ABNORMAL HIGH (ref 15–41)
Albumin: 2.2 g/dL — ABNORMAL LOW (ref 3.5–5.0)
Alkaline Phosphatase: 98 U/L (ref 38–126)
Anion gap: 10 (ref 5–15)
BUN: 37 mg/dL — ABNORMAL HIGH (ref 6–20)
CO2: 25 mmol/L (ref 22–32)
Calcium: 7.7 mg/dL — ABNORMAL LOW (ref 8.9–10.3)
Chloride: 102 mmol/L (ref 98–111)
Creatinine, Ser: 3.06 mg/dL — ABNORMAL HIGH (ref 0.61–1.24)
GFR calc Af Amer: 27 mL/min — ABNORMAL LOW (ref >60)
GFR calc non Af Amer: 23 mL/min — ABNORMAL LOW (ref >60)
Glucose, Bld: 121 mg/dL — ABNORMAL HIGH (ref 70–99)
Potassium: 5 mmol/L (ref 3.5–5.1)
Sodium: 137 mmol/L (ref 135–145)
Total Bilirubin: 0.9 mg/dL (ref 0.3–1.2)
Total Protein: 5.7 g/dL — ABNORMAL LOW (ref 6.5–8.1)

## 2019-02-28 LAB — COOXEMETRY PANEL
Carboxyhemoglobin: 0.7 % (ref 0.5–1.5)
Methemoglobin: 2.4 % — ABNORMAL HIGH (ref 0.0–1.5)
O2 Saturation: 87.4 %
Total hemoglobin: 12.6 g/dL (ref 12.0–16.0)

## 2019-02-28 LAB — GLUCOSE, CAPILLARY
Glucose-Capillary: 108 mg/dL — ABNORMAL HIGH (ref 70–99)
Glucose-Capillary: 109 mg/dL — ABNORMAL HIGH (ref 70–99)
Glucose-Capillary: 113 mg/dL — ABNORMAL HIGH (ref 70–99)
Glucose-Capillary: 127 mg/dL — ABNORMAL HIGH (ref 70–99)
Glucose-Capillary: 135 mg/dL — ABNORMAL HIGH (ref 70–99)
Glucose-Capillary: 64 mg/dL — ABNORMAL LOW (ref 70–99)
Glucose-Capillary: 88 mg/dL (ref 70–99)

## 2019-02-28 LAB — FERRITIN: Ferritin: 1225 ng/mL — ABNORMAL HIGH (ref 24–336)

## 2019-02-28 LAB — MAGNESIUM: Magnesium: 2.7 mg/dL — ABNORMAL HIGH (ref 1.7–2.4)

## 2019-02-28 LAB — HEPARIN LEVEL (UNFRACTIONATED): Heparin Unfractionated: 0.42 IU/mL (ref 0.30–0.70)

## 2019-02-28 LAB — PHOSPHORUS: Phosphorus: 5.2 mg/dL — ABNORMAL HIGH (ref 2.5–4.6)

## 2019-02-28 LAB — TRIGLYCERIDES: Triglycerides: 328 mg/dL — ABNORMAL HIGH (ref <150)

## 2019-02-28 MED ORDER — FENTANYL 100 MCG/HR TD PT72
2.0000 | MEDICATED_PATCH | TRANSDERMAL | Status: DC
  Administered 2019-03-01 – 2019-03-07 (×3): 2 via TRANSDERMAL
  Filled 2019-02-28: qty 1
  Filled 2019-02-28 (×4): qty 2

## 2019-02-28 MED ORDER — LEVETIRACETAM IN NACL 1000 MG/100ML IV SOLN
1000.0000 mg | Freq: Two times a day (BID) | INTRAVENOUS | Status: DC
  Filled 2019-02-28 (×2): qty 100

## 2019-02-28 MED ORDER — ALBUMIN HUMAN 25 % IV SOLN
25.0000 g | Freq: Once | INTRAVENOUS | Status: AC
  Administered 2019-02-28: 25 g via INTRAVENOUS
  Filled 2019-02-28: qty 50
  Filled 2019-02-28: qty 100

## 2019-02-28 MED ORDER — DEXTROSE 50 % IV SOLN
INTRAVENOUS | Status: AC
  Administered 2019-02-28: 25 mL
  Filled 2019-02-28: qty 50

## 2019-02-28 MED ORDER — DEXMEDETOMIDINE HCL IN NACL 400 MCG/100ML IV SOLN
0.0000 ug/kg/h | INTRAVENOUS | Status: DC
  Administered 2019-02-28: 12:00:00 1 ug/kg/h via INTRAVENOUS
  Administered 2019-03-01 (×3): 1.2 ug/kg/h via INTRAVENOUS
  Administered 2019-03-01: 11:00:00 0.4 ug/kg/h via INTRAVENOUS
  Administered 2019-03-01 – 2019-03-02 (×5): 1.2 ug/kg/h via INTRAVENOUS
  Filled 2019-02-28 (×10): qty 100

## 2019-02-28 MED ORDER — FENTANYL 100 MCG/HR TD PT72
1.0000 | MEDICATED_PATCH | TRANSDERMAL | Status: DC
  Administered 2019-03-01 – 2019-03-04 (×2): 1 via TRANSDERMAL
  Filled 2019-02-28 (×2): qty 1

## 2019-02-28 MED ORDER — CLONAZEPAM 1 MG PO TABS
2.0000 mg | ORAL_TABLET | Freq: Four times a day (QID) | ORAL | Status: DC
  Administered 2019-02-28 – 2019-03-11 (×44): 2 mg
  Filled 2019-02-28 (×44): qty 2

## 2019-02-28 MED ORDER — STERILE WATER FOR INJECTION IJ SOLN
INTRAMUSCULAR | Status: AC
  Administered 2019-02-28: 10 mL
  Filled 2019-02-28: qty 10

## 2019-02-28 NOTE — Progress Notes (Signed)
NAME:  Jonathan [name], MRN:  [id number], DOB:  October 05, 1971, LOS: 5 ADMISSION DATE:  02/23/2019, CONSULTATION DATE:  02/23/2019 REFERRING MD:  [facility], CHIEF COMPLAINT:  Respiratory failure   Brief History   48 year old man intubated for severe respiratory failure requiring prone ventilation and nitric oxide for ARDS related to COVID-19.  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  Past Medical History  Limited information. T2DM recently diagnosed on OHA.   Significant Hospital Events   4/21 admitted and immediately proned on inverse ratio PC ventilation with high PEEP to maintain saturation >85%, Started on NO at 40ppm. 4/22- Tocluzimab X 1 4/21-4/24 Proned daily  Consults:  PCCM 4/21  Procedures:  4/21 - ETT 7.5,  4/21 - RIJ CVC [facility]  Significant Diagnostic Tests:  POC U/S 4/21: Bilateral interstitial pattern with bilateral dense consolidation at bases. Echo shows low-normal LVSF. RV moderately dilated with septal shift.   Micro Data:  Blood cultures -PND SARS-CoV2 - POSITIVE   Antimicrobials:  Azithromycin 4/21 >> Ceftriaxone 4/21 >> Hydroxychloroquine 4/21 > 4/22  Interim history/subjective:  Increased O2 requirements with supine ventilation yesterday. Required intermittent doses of NMB overnight while prone with improvement in oxygenation despite imperfect ventilator synchrony.  Objective   Blood pressure 119/77, pulse 69, temperature 99.3 F (37.4 C), temperature source Rectal, resp. rate 14, height 5\' 5"  (1.651 m), weight 81.6 kg, SpO2 96 %.    Vent Mode: PRVC FiO2 (%):  [50 %-100 %] 60 % Set Rate:  [15 bmp-32 bmp] 18 bmp Vt Set:  [360 mL] 360 mL PEEP:  [18 cmH20] 18 cmH20 Plateau Pressure:  [19 cmH20-35 cmH20] 19 cmH20   Intake/Output Summary (Last 24 hours) at 02/28/2019 0904 Last data filed at 02/28/2019 0900 Gross per 24 hour  Intake  3238.01 ml  Output 5364 ml  Net -2125.99 ml   Filed Weights   02/26/19 0549 02/27/19 0426 02/28/19 0344  Weight: 85.7 kg 84.7 kg 81.6 kg   Examination: Gen:      Moderately overweight. Sedated.  HEENT:  ETT and OGT in place. Small upper lip ulceration.  Right periorbital swelling Lungs:    Clear anteriorly.  Crackles at bases. Prone ventilation. Occasional dyssynchrony. CV:         Regular rate and rhythm; no murmurs Abd:      + bowel sounds; soft, non-tender; no palpable masses, no distension Ext: Mild edema edema; adequate peripheral perfusion Skin:      Warm and dry; no rash Neuro: Unresponsive to painful stimulation.  Resolved Hospital Problem list     Assessment & Plan:  48 year old with severe ARDS, multiorgan failure due to COVID  Critically ill due to acute hypoxic and hypercarbic respiratory failure due to ARDS from COVID-19, requiring mechanical ventilation. Continue to respond well to prone ventilation. Increasing oxygen requirements when supine. Keep prone today to maximize lung protection.  Resume supine ventilation tomorrow. Gradual PEEP wean. Likely will not tolerate resumption of supine ventilation until PEEP is below 15. Continue inhaled NO until PEEP< 15.  Critically ill due to cardiogenic shock due to acute cor pulmonale. RV dysfunction noted on bedside echo- related to ARDS Off pressors. Continues on low dose dobutamine We will taper stress steroids now that he is off norepinephrine Continue low-dose dobutamine indefinitely until respiratory failure resolves Continue fluid removal.  Diabetes mellitus Titrate insulin as necessary to achieve adequate glycemic control  AKI Monitor urine output On CVVH.  Continue fluid removal as tolerated On low dose heparin gtt due to elevated D-Dimer and CVVH > will increase to full anticoagulation as he has clotting issues of circuit   Best practice:  Diet: trickle feed only while prone Pain/Anxiety/Delirium  protocol (if indicated): Fentanyl, predex, klonopin, Oxycodone,  PRN lorazepam VAP protocol (if indicated): Bundle in place. DVT prophylaxis:Heparin gtt GI prophylaxis: Famotidine Glucose control: Lantus, SSI coverage. Mobility: Bedrest Code Status: Full Family Communication: Family in [place].  I updated his spouse over the phone 4/25. Disposition: ICU.  Labs   CBC: Recent Labs  Lab 02/24/19 0054  02/25/19 0456  02/26/19 0545 02/26/19 1627 02/27/19 0331 02/27/19 1547 02/28/19 0343  WBC 15.7*  --  15.1*  --  14.0*  --  14.3*  --  12.4*  NEUTROABS 13.1*  --  13.1*  --  12.1*  --  11.9*  --  9.3*  HGB 13.1   < > 12.7*   < > 11.3* 11.2* 11.6* 10.5* 12.0*  HCT 40.7   < > 39.2   < > 35.7* 33.0* 35.8* 31.0* 38.7*  MCV 92.1  --  90.5  --  93.0  --  90.9  --  93.5  PLT 152  --  145*  --  141*  --  115*  --  144*   < > = values in this interval not displayed.    Basic Metabolic Panel: Recent Labs  Lab 02/24/19 0054  02/25/19 0456  02/26/19 0545  02/26/19 1648 02/27/19 0331 02/27/19 1527 02/27/19 1547 02/28/19 0343  NA 140   < > 140   < > 137   < > 139 136 134* 133* 137  K 4.5   < > 4.9   < > 5.2*   < > 4.8 4.8 5.0 4.9 5.0  CL 107   < > 107   < > 105  --  105 102 102  --  102  CO2 19*   < > 22   < > 20*  --  22 23 21*  --  25  GLUCOSE 273*   < > 178*   < > 201*  --  152* 124* 207*  --  121*  BUN 26*   < > 31*   < > 55*  --  45* 44* 40*  --  37*  CREATININE 2.83*   < > 3.05*   < > 4.17*  --  3.49* 3.16* 3.02*  --  3.06*  CALCIUM 6.6*   < > 7.0*   < > 7.0*  --  7.3* 7.2* 7.3*  --  7.7*  MG 1.9  --  2.3  --  2.5*  --   --  2.6*  --   --  2.7*  PHOS 5.6*   < > 4.8*   < > 5.3*  --  4.7* 4.2 4.3  --  5.2*   < > = values in this interval not displayed.   GFR: Estimated Creatinine Clearance: 29 mL/min (A) (by C-G formula based on SCr of 3.06 mg/dL (H)). Recent Labs  Lab 02/23/19 0946 02/23/19 1215  02/25/19 0456 02/26/19 0545 02/27/19 0331 02/28/19 0343  PROCALCITON 4.53   --   --   --   --   --   --   WBC 10.1 8.2   < > 15.1* 14.0* 14.3* 12.4*  LATICACIDVEN  --  1.5  --   --   --   --   --    < > =  values in this interval not displayed.    Liver Function Tests: Recent Labs  Lab 02/24/19 0054  02/25/19 0456  02/26/19 0545 02/26/19 1648 02/27/19 0331 02/27/19 1527 02/28/19 0343  AST 176*  --  202*  --  98*  --  86*  --  89*  ALT 100*  --  132*  --  102*  --  95*  --  94*  ALKPHOS 56  --  74  --  76  --  85  --  98  BILITOT 0.9  --  1.0  --  0.7  --  0.7  --  0.9  PROT 5.2*  --  5.3*  --  4.8*  --  4.9*  --  5.7*  ALBUMIN 2.0*   < > 1.8*   < > 1.7* 1.9* 1.9* 2.0* 2.2*   < > = values in this interval not displayed.   No results for input(s): LIPASE, AMYLASE in the last 168 hours. No results for input(s): AMMONIA in the last 168 hours.  ABG    Component Value Date/Time   PHART 7.368 02/27/2019 1547   PCO2ART 41.0 02/27/2019 1547   PO2ART 64.0 (L) 02/27/2019 1547   HCO3 23.7 02/27/2019 1547   TCO2 25 02/27/2019 1547   ACIDBASEDEF 2.0 02/27/2019 1547   O2SAT 87.4 02/28/2019 0350     Coagulation Profile: No results for input(s): INR, PROTIME in the last 168 hours.  Cardiac Enzymes: No results for input(s): CKTOTAL, CKMB, CKMBINDEX, TROPONINI in the last 168 hours.  HbA1C: No results found for: HGBA1C  CBG: Recent Labs  Lab 02/27/19 1516 02/27/19 2010 02/28/19 0028 02/28/19 0335 02/28/19 0742  GLUCAP 184* 154* 135* 127* 113*   The patient is critically ill with multiple organ system failure and requires high complexity decision making for assessment and support, frequent evaluation and titration of therapies, advanced monitoring, review of radiographic studies and interpretation of complex data.   Critical Care Time devoted to patient care services, exclusive of separately billable procedures, described in this note is 40 minutes.   Kipp Brood, MD [facility] ICU Physician [place]  Pager: [phone] Mobile:  [phone] After hours: [phone].  02/28/2019, 9:04 AM      02/28/2019, 9:04 AM

## 2019-02-28 NOTE — Progress Notes (Signed)
Pt HR 55, BP dropped from 120/80 to 90/44, stopped precedex and notified MD. GIven orders to keep even on CRRT and give 25g albumin IV. Adjustments made. Will continue to monitor.

## 2019-02-28 NOTE — Progress Notes (Signed)
Maud per for heparin Indication: COVID+ with elevated d-dimer and continued clotting of CRRT   Patient Measurements: Height: 5\' 5"  (165.1 cm) Weight: 179 lb 14.3 oz (81.6 kg) IBW/kg (Calculated) : 61.5 Heparin dosing weight: 78.6  Vital Signs: Temp: 99.1 F (37.3 C) (04/26 0800) Temp Source: Rectal (04/26 0800) BP: 103/72 (04/26 0800) Pulse Rate: 67 (04/26 0800)  Labs: Recent Labs    02/26/19 0545  02/27/19 0324 02/27/19 0331 02/27/19 1320 02/27/19 1527 02/27/19 1547 02/28/19 0343  HGB 11.3*   < >  --  11.6*  --   --  10.5* 12.0*  HCT 35.7*   < >  --  35.8*  --   --  31.0* 38.7*  PLT 141*  --   --  115*  --   --   --  144*  HEPARINUNFRC 0.13*   < > 0.30  --  0.50  --   --  0.42  CREATININE 4.17*   < >  --  3.16*  --  3.02*  --  3.06*   < > = values in this interval not displayed.     Assessment: 48 yo M admitted with respiratory distress, cough and AMS found to be COVID+. D-dimers elevated on 4/21 at 11.18. CRRT now started. Pharmacy consulted to dose heparin drip due concerns for increased coagulopathy with elevated d-dimer and COVID-19 diagnosis.  Heparin level is therapeutic. H/h wnl, plts slightly low, of no concern.  Goal of Therapy:  Heparin level 0.3 - 0.7 units/mL Monitor platelets by anticoagulation protocol: Yes   Plan:  Continue heparin gtt at 1250 units/hr  Monitor daily anti-Xa levels, CBCs, and s/sx of bleeding   Harvel Quale 02/28/2019 8:43 AM

## 2019-02-28 NOTE — Progress Notes (Addendum)
Hypoglycemic Event  CBG: 64  Treatment: 1/2 Amp D50  Symptoms: Unknown  Follow-up CBG: Time:2035 CBG Result:108  Possible Reasons for Event: Unknown    Jonathan [name] E [name]

## 2019-02-28 NOTE — Progress Notes (Signed)
Upon assessment, patient SBP 160-170's, HR 100-110, and desynchronous on vent. This RN administered 50 mcg fentanyl PRN x4, 4mg  Ativan PRN x2, 2mg  Versed PRN x1 without success. Precedex restarted. ELink notified.  Obtained blood gas due to concern with patient's vent desynchrony. Refer to results. ELink notified. Goal is to sedate patient more adequately before administering PRN Vec, and rechecking blood gas an hour after. RRT following closely. Will continue to monitor.

## 2019-02-28 NOTE — Progress Notes (Signed)
[facility] Progress Note  Name: Ronda Kazmi MRN: [id number] DOB: 1971/07/12  Chief Complaint:  Shortness of breath   Subjective:  Spoke with RN.  He has continued on CRRT and had 4.9 liters UF over 4/25.  He has been tolerating net neg 100 ml/hr.  Continues on low dose dobutamine.  Anuric.  He did not tolerate a trial of being supine.    Review of systems:  Unable to obtain 2/2 mechanical vent; covid  -------------  Background on consult:   Beaumont Sanda Linger is a 48 y.o. male with a history of recently diagnosed diabetes who presented to the hospital with respiratory distress and altered mental status.  He was intubated and proned and found to be COVID-19 positive.  He has been anuric.  Critical care has consulted requesting CRRT.  His primary team has spoken with his family in [place] via an interpreter and they have indicated that they would want aggressive measures such as dialysis.  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  He has been on levophed and vasopressin with downtrending levophed requirements; previously maxed on levo.  Dobutamine is being added per nursing report.   Intake/Output Summary (Last 24 hours) at 02/28/2019 0500 Last data filed at 02/28/2019 0400 Gross per 24 hour  Intake 2987.85 ml  Output 5275 ml  Net -2287.15 ml    Vitals:  Vitals:   02/28/19 0300 02/28/19 0344 02/28/19 0400 02/28/19 0419  BP: 114/76  107/69 (!) 196/72  Pulse: 69  68 73  Resp: 18  12   Temp:      TempSrc:      SpO2: 99%  96% 97%  Weight:  81.6 kg    Height:         Physical Exam:  Due to the nature of this patient's suspected COVID-19 with isolation and in keeping with efforts to prevent the spread of infection and to conserve personal protective equipment, a physical exam was not personally performed.  Patient's exam were discussed in detail with the RN.  A chart review of other providers notes and the patient's lab work as well as review of  other pertinent studies was performed.   [facility] of [facility]: [facility]   Per nursing:  General adult male intubated and prone HEENT normocephalic atraumatic  Lungs diminished breath sounds Heart RRR Abdomen soft nondistended  Extremities non-pitting diffuse edema Access: left IJ vascath intact per nursing GU: has foley    Medications reviewed   Labs:  BMP Latest Ref Rng & Units 02/28/2019 02/27/2019 02/27/2019  Glucose 70 - 99 mg/dL 121(H) - 207(H)  BUN 6 - 20 mg/dL 37(H) - 40(H)  Creatinine 0.61 - 1.24 mg/dL 3.06(H) - 3.02(H)  Sodium 135 - 145 mmol/L 137 133(L) 134(L)  Potassium 3.5 - 5.1 mmol/L 5.0 4.9 5.0  Chloride 98 - 111 mmol/L 102 - 102  CO2 22 - 32 mmol/L 25 - 21(L)  Calcium 8.9 - 10.3 mg/dL 7.7(L) - 7.3(L)     Assessment/Plan:   # AKI  - Secondary to ATN in the setting of shock, COVID19 - Continue CRRT.  Goal of net negative 100 ml/hr as tolerated   # COVID - 19 positive  - per pulmonology/critical care  # Acute hypoxic respiratory failure - Intubated and proned  - 2/2 COVID 19 with ARDS - CRRT as above to optimize volume status    # Hyperkalemia - Continue CRRT   # Acute metabolic acidosis - Continue CRRT   #  Septic shock - Off of levophed and vaso; on dobutamine   Claudia Desanctis, MD 02/28/2019 5:00 AM

## 2019-02-28 NOTE — Progress Notes (Signed)
RT NOTE:  Pt in prone position w/ head turned to left. RT adjusted patient neck and towel roll under neck. Unable to turn head due to kinking of HD cath. MD aware

## 2019-03-01 LAB — POCT I-STAT 7, (LYTES, BLD GAS, ICA,H+H)
Acid-base deficit: 1 mmol/L (ref 0.0–2.0)
Acid-base deficit: 2 mmol/L (ref 0.0–2.0)
Acid-base deficit: 2 mmol/L (ref 0.0–2.0)
Bicarbonate: 25.9 mmol/L (ref 20.0–28.0)
Bicarbonate: 26.1 mmol/L (ref 20.0–28.0)
Bicarbonate: 26.5 mmol/L (ref 20.0–28.0)
Bicarbonate: 28.1 mmol/L — ABNORMAL HIGH (ref 20.0–28.0)
Calcium, Ion: 1.16 mmol/L (ref 1.15–1.40)
Calcium, Ion: 1.16 mmol/L (ref 1.15–1.40)
Calcium, Ion: 1.18 mmol/L (ref 1.15–1.40)
Calcium, Ion: 1.18 mmol/L (ref 1.15–1.40)
HCT: 31 % — ABNORMAL LOW (ref 39.0–52.0)
HCT: 31 % — ABNORMAL LOW (ref 39.0–52.0)
HCT: 31 % — ABNORMAL LOW (ref 39.0–52.0)
HCT: 32 % — ABNORMAL LOW (ref 39.0–52.0)
Hemoglobin: 10.5 g/dL — ABNORMAL LOW (ref 13.0–17.0)
Hemoglobin: 10.5 g/dL — ABNORMAL LOW (ref 13.0–17.0)
Hemoglobin: 10.5 g/dL — ABNORMAL LOW (ref 13.0–17.0)
Hemoglobin: 10.9 g/dL — ABNORMAL LOW (ref 13.0–17.0)
O2 Saturation: 100 %
O2 Saturation: 90 %
O2 Saturation: 96 %
O2 Saturation: 96 %
Patient temperature: 97.2
Patient temperature: 98.1
Patient temperature: 98.1
Patient temperature: 98.3
Potassium: 5 mmol/L (ref 3.5–5.1)
Potassium: 5.2 mmol/L — ABNORMAL HIGH (ref 3.5–5.1)
Potassium: 5.2 mmol/L — ABNORMAL HIGH (ref 3.5–5.1)
Potassium: 5.3 mmol/L — ABNORMAL HIGH (ref 3.5–5.1)
Sodium: 134 mmol/L — ABNORMAL LOW (ref 135–145)
Sodium: 134 mmol/L — ABNORMAL LOW (ref 135–145)
Sodium: 134 mmol/L — ABNORMAL LOW (ref 135–145)
Sodium: 135 mmol/L (ref 135–145)
TCO2: 28 mmol/L (ref 22–32)
TCO2: 28 mmol/L (ref 22–32)
TCO2: 28 mmol/L (ref 22–32)
TCO2: 30 mmol/L (ref 22–32)
pCO2 arterial: 47.7 mmHg (ref 32.0–48.0)
pCO2 arterial: 57.7 mmHg — ABNORMAL HIGH (ref 32.0–48.0)
pCO2 arterial: 60.3 mmHg — ABNORMAL HIGH (ref 32.0–48.0)
pCO2 arterial: 67.7 mmHg (ref 32.0–48.0)
pH, Arterial: 7.224 — ABNORMAL LOW (ref 7.350–7.450)
pH, Arterial: 7.249 — ABNORMAL LOW (ref 7.350–7.450)
pH, Arterial: 7.259 — ABNORMAL LOW (ref 7.350–7.450)
pH, Arterial: 7.342 — ABNORMAL LOW (ref 7.350–7.450)
pO2, Arterial: 101 mmHg (ref 83.0–108.0)
pO2, Arterial: 263 mmHg — ABNORMAL HIGH (ref 83.0–108.0)
pO2, Arterial: 68 mmHg — ABNORMAL LOW (ref 83.0–108.0)
pO2, Arterial: 99 mmHg (ref 83.0–108.0)

## 2019-03-01 LAB — CBC
HCT: 34.8 % — ABNORMAL LOW (ref 39.0–52.0)
Hemoglobin: 10.9 g/dL — ABNORMAL LOW (ref 13.0–17.0)
MCH: 29.7 pg (ref 26.0–34.0)
MCHC: 31.3 g/dL (ref 30.0–36.0)
MCV: 94.8 fL (ref 80.0–100.0)
Platelets: 179 10*3/uL (ref 150–400)
RBC: 3.67 MIL/uL — ABNORMAL LOW (ref 4.22–5.81)
RDW: 14.2 % (ref 11.5–15.5)
WBC: 12.2 10*3/uL — ABNORMAL HIGH (ref 4.0–10.5)
nRBC: 0.9 % — ABNORMAL HIGH (ref 0.0–0.2)

## 2019-03-01 LAB — INTERLEUKIN-6, PLASMA
Interleukin-6, Plasma: >3000 pg/mL — AB (ref 0.0–12.2)
Interleukin-6, Plasma: >3000 pg/mL — AB (ref 0.0–12.2)

## 2019-03-01 LAB — GLUCOSE, CAPILLARY
Glucose-Capillary: 114 mg/dL — ABNORMAL HIGH (ref 70–99)
Glucose-Capillary: 130 mg/dL — ABNORMAL HIGH (ref 70–99)
Glucose-Capillary: 155 mg/dL — ABNORMAL HIGH (ref 70–99)
Glucose-Capillary: 175 mg/dL — ABNORMAL HIGH (ref 70–99)
Glucose-Capillary: 223 mg/dL — ABNORMAL HIGH (ref 70–99)
Glucose-Capillary: 98 mg/dL (ref 70–99)

## 2019-03-01 LAB — COOXEMETRY PANEL
Carboxyhemoglobin: 1.1 % (ref 0.5–1.5)
Methemoglobin: 2.2 % — ABNORMAL HIGH (ref 0.0–1.5)
O2 Saturation: 91.6 %
Total hemoglobin: 10.8 g/dL — ABNORMAL LOW (ref 12.0–16.0)

## 2019-03-01 LAB — RENAL FUNCTION PANEL
Albumin: 2.4 g/dL — ABNORMAL LOW (ref 3.5–5.0)
Albumin: 2.5 g/dL — ABNORMAL LOW (ref 3.5–5.0)
Anion gap: 7 (ref 5–15)
Anion gap: 10 (ref 5–15)
BUN: 31 mg/dL — ABNORMAL HIGH (ref 6–20)
BUN: 30 mg/dL — ABNORMAL HIGH (ref 6–20)
CO2: 26 mmol/L (ref 22–32)
CO2: 24 mmol/L (ref 22–32)
Calcium: 7.7 mg/dL — ABNORMAL LOW (ref 8.9–10.3)
Calcium: 8.2 mg/dL — ABNORMAL LOW (ref 8.9–10.3)
Chloride: 104 mmol/L (ref 98–111)
Chloride: 101 mmol/L (ref 98–111)
Creatinine, Ser: 2.8 mg/dL — ABNORMAL HIGH (ref 0.61–1.24)
Creatinine, Ser: 2.68 mg/dL — ABNORMAL HIGH (ref 0.61–1.24)
GFR calc Af Amer: 30 mL/min — ABNORMAL LOW (ref >60)
GFR calc Af Amer: 31 mL/min — ABNORMAL LOW (ref >60)
GFR calc non Af Amer: 26 mL/min — ABNORMAL LOW (ref >60)
GFR calc non Af Amer: 27 mL/min — ABNORMAL LOW (ref >60)
Glucose, Bld: 147 mg/dL — ABNORMAL HIGH (ref 70–99)
Glucose, Bld: 149 mg/dL — ABNORMAL HIGH (ref 70–99)
Phosphorus: 4.2 mg/dL (ref 2.5–4.6)
Phosphorus: 3.5 mg/dL (ref 2.5–4.6)
Potassium: 5.2 mmol/L — ABNORMAL HIGH (ref 3.5–5.1)
Potassium: 5 mmol/L (ref 3.5–5.1)
Sodium: 137 mmol/L (ref 135–145)
Sodium: 135 mmol/L (ref 135–145)

## 2019-03-01 LAB — MAGNESIUM: Magnesium: 2.7 mg/dL — ABNORMAL HIGH (ref 1.7–2.4)

## 2019-03-01 LAB — HEPARIN LEVEL (UNFRACTIONATED): Heparin Unfractionated: 0.44 IU/mL (ref 0.30–0.70)

## 2019-03-01 MED ORDER — PRO-STAT SUGAR FREE PO LIQD
60.0000 mL | Freq: Three times a day (TID) | ORAL | Status: DC
  Administered 2019-03-01 – 2019-03-17 (×44): 60 mL
  Filled 2019-03-01 (×46): qty 60

## 2019-03-01 MED ORDER — CLONIDINE HCL 0.3 MG PO TABS
0.3000 mg | ORAL_TABLET | Freq: Four times a day (QID) | ORAL | Status: DC
  Administered 2019-03-01 (×2): 0.3 mg via ORAL
  Filled 2019-03-01 (×3): qty 1

## 2019-03-01 MED ORDER — SODIUM CHLORIDE 0.9 % IV SOLN
INTRAVENOUS | Status: DC | PRN
  Administered 2019-03-05 – 2019-03-06 (×2): 250 mL via INTRAVENOUS
  Administered 2019-03-07: 500 mL via INTRAVENOUS
  Administered 2019-03-10 (×2): 1000 mL via INTRAVENOUS
  Administered 2019-03-11: 250 mL via INTRAVENOUS

## 2019-03-01 MED ORDER — VITAL AF 1.2 CAL PO LIQD
1000.0000 mL | ORAL | Status: DC
  Administered 2019-03-01: 15:00:00 1000 mL

## 2019-03-01 NOTE — Progress Notes (Signed)
[facility] Progress Note Patient Name: Jonathan [name] DOB: Oct 14, 1971 MRN: [id number]   Date of Service  03/01/2019  HPI/Events of Note  ABG on 60%/PRVC 22/TV 360/P 18 = 7.249/60.3/68.0.   eICU Interventions  Will order: 1. Increase PRVC rate to 27.  2. ABG at 4 AM.     Intervention Category Major Interventions: Respiratory failure - evaluation and management  Sommer,Steven Eugene 03/01/2019, 1:51 AM

## 2019-03-01 NOTE — Progress Notes (Signed)
Patient was unproned  At 0930  With all lines intact . Tolerated well with brief desaturation . I have updatted his family via phone with the interpreter.

## 2019-03-01 NOTE — Procedures (Signed)
Admit: 02/23/2019 LOS: 63  78M COVID with cardiogenic shock, hypoxic VDRF, AKI on CRRT  Current CRRT Prescription: Start Date: 4/22 Catheter: IJ BFR: 150 Pre Blood Pump: 300 4k DFR: 1800 4k Replacement Rate: 300 4k Goal UF: even Anticoagulation: systemic heparin Clotting: infrequent  S:  Pt not examined, d/w CCM MD and ICU RN  K 5.2 to 5.3  UF Rate reduced yesterday, BPs improved now  Prone   FIO2 weened overnight  Remains on dubtamine  Anuric  O: 04/26 0701 - 04/27 0700 In: 3365.6 [I.V.:2465.6; NG/GT:815; IV Piggyback:85.1] Out: 3925 [Urine:30]  Filed Weights   02/27/19 0426 02/28/19 0344 03/01/19 0455  Weight: 84.7 kg 81.6 kg 79.8 kg    Recent Labs  Lab 02/28/19 0343 02/28/19 1527  03/01/19 0135 03/01/19 0341 03/01/19 0355  NA 137 137   < > 134* 134* 137  K 5.0 4.9   < > 5.2* 5.3* 5.2*  CL 102 104  --   --   --  104  CO2 25 24  --   --   --  26  GLUCOSE 121* 94  --   --   --  147*  BUN 37* 34*  --   --   --  31*  CREATININE 3.06* 2.86*  --   --   --  2.80*  CALCIUM 7.7* 7.9*  --   --   --  7.7*  PHOS 5.2* 4.2  --   --   --  4.2   < > = values in this interval not displayed.   Recent Labs  Lab 02/26/19 0545  02/27/19 0331  02/28/19 0343  03/01/19 0135 03/01/19 0341 03/01/19 0355  WBC 14.0*  --  14.3*  --  12.4*  --   --   --  12.2*  NEUTROABS 12.1*  --  11.9*  --  9.3*  --   --   --   --   HGB 11.3*   < > 11.6*   < > 12.0*   < > 10.5* 10.5* 10.9*  HCT 35.7*   < > 35.8*   < > 38.7*   < > 31.0* 31.0* 34.8*  MCV 93.0  --  90.9  --  93.5  --   --   --  94.8  PLT 141*  --  115*  --  144*  --   --   --  179   < > = values in this interval not displayed.    Scheduled Meds: . chlorhexidine gluconate (MEDLINE KIT)  15 mL Mouth Rinse BID  . clonazePAM  2 mg Per Tube Q6H  . cloNIDine  0.3 mg Oral Q6H  . docusate  100 mg Per Tube BID  . famotidine  20 mg Per Tube Daily  . feeding supplement (VITAL AF 1.2 CAL)  1,000 mL Per Tube Q24H  . fentaNYL   2 patch Transdermal Q72H   And  . fentaNYL  1 patch Transdermal Q72H  . hydrocortisone sod succinate (SOLU-CORTEF) inj  50 mg Intravenous BID   Followed by  . [START ON 03/02/2019] hydrocortisone sod succinate (SOLU-CORTEF) inj  25 mg Intravenous Q12H   Followed by  . [START ON 03/04/2019] hydrocortisone sod succinate (SOLU-CORTEF) inj  25 mg Intravenous Once  . insulin aspart  0-15 Units Subcutaneous Q4H  . insulin aspart  6 Units Subcutaneous Q4H  . insulin glargine  8 Units Subcutaneous Daily  . mouth rinse  15 mL Mouth Rinse 10 times per  day  . oxyCODONE  10 mg Oral Q4H  . QUEtiapine  50 mg Oral BID  . sennosides  5 mL Oral BID   Continuous Infusions: .  prismasol BGK 4/2.5 300 mL/hr at 03/01/19 0258  .  prismasol BGK 4/2.5 300 mL/hr at 03/01/19 5277  . sodium chloride    . dexmedetomidine (PRECEDEX) IV infusion 1.2 mcg/kg/hr (03/01/19 0800)  . DOBUTamine 0.5 mcg/kg/min (03/01/19 0800)  . fentaNYL infusion INTRAVENOUS 200 mcg/hr (03/01/19 0800)  . heparin 1,250 Units/hr (03/01/19 0800)  . prismasol BGK 4/2.5 1,800 mL/hr at 03/01/19 0620   PRN Meds:.Place/Maintain arterial line **AND** sodium chloride, acetaminophen, fentaNYL, heparin, LORazepam, midazolam, vecuronium  ABG    Component Value Date/Time   PHART 7.259 (L) 03/01/2019 0341   PCO2ART 57.7 (H) 03/01/2019 0341   PO2ART 99.0 03/01/2019 0341   HCO3 25.9 03/01/2019 0341   TCO2 28 03/01/2019 0341   ACIDBASEDEF 2.0 03/01/2019 0341   O2SAT 91.6 03/01/2019 0525    A/P  1. Dialysis dependent AKI 2/2 ATN, COVID19 2. ARDS/Hypoxic VDRF 3. Systolic HF on dobutamine 4. DM2  Inc pre fluid rate to 1031m/hr, Cont all 4K.  UF rate d/w CCM, they will adjust today  RPearson Grippe MD [facility] pgr [phone]

## 2019-03-01 NOTE — Progress Notes (Signed)
Inpatient Diabetes Program Recommendations  AACE/ADA: New Consensus Statement on Inpatient Glycemic Control (2015)  Target Ranges:  Prepandial:   less than 140 mg/dL      Peak postprandial:   less than 180 mg/dL (1-2 hours)      Critically ill patients:  140 - 180 mg/dL   Results for HENRI, BAUMLER (MRN [id number]) as of 03/01/2019 07:57  Ref. Range 02/28/2019 00:28 02/28/2019 03:35 02/28/2019 07:42 02/28/2019 10:55 02/28/2019 15:20 02/28/2019 20:01 02/28/2019 20:33  Glucose-Capillary Latest Ref Range: 70 - 99 mg/dL 135 (H)  8 units NOVOLOG  127 (H)  8 units NOVOLOG 113 (H)  6 units NOVOLOG +  8 units LANTUS 109 (H)  6 units NOVOLOG 88 64 (L) 108 (H)   Results for MASIAH, WOODY (MRN [id number]) as of 03/01/2019 07:57  Ref. Range 03/01/2019 00:04 03/01/2019 03:21 03/01/2019 07:37  Glucose-Capillary Latest Ref Range: 70 - 99 mg/dL 155 (H)  9 units NOVOLOG 175 (H)  9 units NOVOLOG 114 (H)  6 units NOVOLOG     Home DM Meds: Unknown  Current Orders: Lantus 8 units Daily     Novolog Moderate Correction Scale/ SSI (0-15 units) Q4 hours     Novolog 6 units Q4 hours     Remains on Vent.  Getting CRRT.  Tube feeds Vital AF running 20cc/hr.  Also getting Solucortef 50 mg BID.    MD- Note dose of Solucortef to reduce to 25 mg BID tomorrow AM (04/28).  Patient had Mild Hypoglycemic event last night at 8pm.  Since steroids to decrease tomorrow and Tube feeds only running at 20cc/hour, please consider the following:  1. Reduce Novolog Tube feed Coverage to: Novolog 4 units Q4 hours  2. Reduce Novolog SSI to Sensitive scale (0-9 units) Q4 hours     --Will follow patient during hospitalization--  Wyn Quaker RN, MSN, CDE Diabetes Coordinator Inpatient Glycemic Control Team Team Pager: [phone] (8a-5p)

## 2019-03-01 NOTE — Progress Notes (Signed)
NAME:  Jonathan [name], MRN:  [id number], DOB:  09/17/1971, LOS: 6 ADMISSION DATE:  02/23/2019, CONSULTATION DATE:  02/23/2019 REFERRING MD:  [facility], CHIEF COMPLAINT:  Respiratory failure   Brief History   48 year old man intubated for severe respiratory failure requiring prone ventilation and nitric oxide for ARDS related to COVID-19.  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  Past Medical History  Limited information. T2DM recently diagnosed on OHA.   Significant Hospital Events   4/21 admitted and immediately proned on inverse ratio PC ventilation with high PEEP to maintain saturation >85%, Started on NO at 40ppm. 4/22- Tocluzimab X 1 4/21-4/24 Proned daily  Consults:  PCCM 4/21  Procedures:  4/21 - ETT 7.5,  4/21 - RIJ [facility]  Significant Diagnostic Tests:  POC U/S 4/21: Bilateral interstitial pattern with bilateral dense consolidation at bases. Echo shows low-normal LVSF. RV moderately dilated with septal shift.   Micro Data:  Blood cultures -PND SARS-CoV2 - POSITIVE   Antimicrobials:  Azithromycin 4/21 >> Ceftriaxone 4/21 >> Hydroxychloroquine 4/21 > 4/22  Interim history/subjective:  Stable O2 requirements overnight. Resumed supine ventilation with stable oxygenation. Some desaturation associated with dyssynchrony, improved with NMB Hypotension yesterday improved with reduction of UF rate Objective   Blood pressure 109/71, pulse (!) 58, temperature 98.1 F (36.7 C), temperature source Axillary, resp. rate (!) 30, height 5\' 5"  (1.651 m), weight 79.8 kg, SpO2 95 %.    Vent Mode: PRVC FiO2 (%):  [40 %-100 %] 80 % Set Rate:  [18 bmp-30 bmp] 30 bmp Vt Set:  [360 mL] 360 mL PEEP:  [16 cmH20-18 cmH20] 18 cmH20 Plateau Pressure:  [22 cmH20-30 cmH20] 30 cmH20   Intake/Output Summary (Last 24 hours) at 03/01/2019 1739 Last data filed at  03/01/2019 1700 Gross per 24 hour  Intake 2840.78 ml  Output 3355 ml  Net -514.22 ml   Filed Weights   02/27/19 0426 02/28/19 0344 03/01/19 0455  Weight: 84.7 kg 81.6 kg 79.8 kg   Examination: Gen:      Moderately overweight. Sedated.  HEENT:  ETT and OGT in place. Small upper lip ulceration.  Right periorbital swelling Lungs:    Clear anteriorly.  Crackles at bases have improved. Supine ventilation. Occasional dyssynchrony. Pplat stable at 35. No PEEPi CV:         Regular rate and rhythm; no murmurs Abd:      + bowel sounds; soft, non-tender; no palpable masses, no distension Ext: Mild edema edema; adequate peripheral perfusion Skin:      Warm and dry; no rash Neuro: Unresponsive to painful stimulation.  Resolved Hospital Problem list     Assessment & Plan:  48 year old with severe ARDS, multiorgan failure due to COVID  Critically ill due to acute hypoxic and hypercarbic respiratory failure due to ARDS from COVID-19, requiring mechanical ventilation. Tolerating supine ventilation. Gradual PEEP wean starting tomorrow if FiO2 remains <0.5 Continue inhaled NO until PEEP< 15.  Critically ill due to cardiogenic shock due to acute cor pulmonale. RV dysfunction noted on bedside echo- related to ARDS Off pressors. Continues on low dose dobutamine Continue low-dose dobutamine indefinitely until respiratory failure resolves Continue fluid removal at slower rate   Diabetes mellitus Titrate insulin as necessary to achieve adequate glycemic control  AKI Monitor urine output On CVVH.  Continue fluid removal as tolerated On low dose heparin gtt due to elevated D-Dimer  and CVVH > will increase to full anticoagulation as he has clotting issues of circuit   Best practice:  Diet: PEPUP protocol now that supine. Pain/Anxiety/Delirium protocol (if indicated): Fentanyl, predex, klonopin, Oxycodone,  PRN lorazepam VAP protocol (if indicated): Bundle in place. DVT prophylaxis:Heparin gtt  GI prophylaxis: Famotidine Glucose control: Lantus, SSI coverage. Mobility: Bedrest Code Status: Full Family Communication: Family in [place].  I updated his spouse over the phone 4/27. Disposition: ICU.  Labs   CBC: Recent Labs  Lab 02/24/19 0054  02/25/19 0456  02/26/19 0545  02/27/19 0331  02/28/19 0343  03/01/19 0012 03/01/19 0135 03/01/19 0341 03/01/19 0355 03/01/19 1051  WBC 15.7*  --  15.1*  --  14.0*  --  14.3*  --  12.4*  --   --   --   --  12.2*  --   NEUTROABS 13.1*  --  13.1*  --  12.1*  --  11.9*  --  9.3*  --   --   --   --   --   --   HGB 13.1   < > 12.7*   < > 11.3*   < > 11.6*   < > 12.0*   < > 10.9* 10.5* 10.5* 10.9* 10.5*  HCT 40.7   < > 39.2   < > 35.7*   < > 35.8*   < > 38.7*   < > 32.0* 31.0* 31.0* 34.8* 31.0*  MCV 92.1  --  90.5  --  93.0  --  90.9  --  93.5  --   --   --   --  94.8  --   PLT 152  --  145*  --  141*  --  115*  --  144*  --   --   --   --  179  --    < > = values in this interval not displayed.    Basic Metabolic Panel: Recent Labs  Lab 02/25/19 0456  02/26/19 0545  02/27/19 0331 02/27/19 1527  02/28/19 0343 02/28/19 1527  03/01/19 0135 03/01/19 0341 03/01/19 0355 03/01/19 1051 03/01/19 1500  NA 140   < > 137   < > 136 134*   < > 137 137   < > 134* 134* 137 134* 135  K 4.9   < > 5.2*   < > 4.8 5.0   < > 5.0 4.9   < > 5.2* 5.3* 5.2* 5.0 5.0  CL 107   < > 105   < > 102 102  --  102 104  --   --   --  104  --  101  CO2 22   < > 20*   < > 23 21*  --  25 24  --   --   --  26  --  24  GLUCOSE 178*   < > 201*   < > 124* 207*  --  121* 94  --   --   --  147*  --  149*  BUN 31*   < > 55*   < > 44* 40*  --  37* 34*  --   --   --  31*  --  30*  CREATININE 3.05*   < > 4.17*   < > 3.16* 3.02*  --  3.06* 2.86*  --   --   --  2.80*  --  2.68*  CALCIUM 7.0*   < > 7.0*   < > 7.2*  7.3*  --  7.7* 7.9*  --   --   --  7.7*  --  8.2*  MG 2.3  --  2.5*  --  2.6*  --   --  2.7*  --   --   --   --  2.7*  --   --   PHOS 4.8*   < > 5.3*   < > 4.2 4.3   --  5.2* 4.2  --   --   --  4.2  --  3.5   < > = values in this interval not displayed.   GFR: Estimated Creatinine Clearance: 32.8 mL/min (A) (by C-G formula based on SCr of 2.68 mg/dL (H)). Recent Labs  Lab 02/23/19 0946 02/23/19 1215  02/26/19 0545 02/27/19 0331 02/28/19 0343 03/01/19 0355  PROCALCITON 4.53  --   --   --   --   --   --   WBC 10.1 8.2   < > 14.0* 14.3* 12.4* 12.2*  LATICACIDVEN  --  1.5  --   --   --   --   --    < > = values in this interval not displayed.    Liver Function Tests: Recent Labs  Lab 02/24/19 0054  02/25/19 0456  02/26/19 0545  02/27/19 0331 02/27/19 1527 02/28/19 0343 02/28/19 1527 03/01/19 0355 03/01/19 1500  AST 176*  --  202*  --  98*  --  86*  --  89*  --   --   --   ALT 100*  --  132*  --  102*  --  95*  --  94*  --   --   --   ALKPHOS 56  --  74  --  76  --  85  --  98  --   --   --   BILITOT 0.9  --  1.0  --  0.7  --  0.7  --  0.9  --   --   --   PROT 5.2*  --  5.3*  --  4.8*  --  4.9*  --  5.7*  --   --   --   ALBUMIN 2.0*   < > 1.8*   < > 1.7*   < > 1.9* 2.0* 2.2* 2.6* 2.4* 2.5*   < > = values in this interval not displayed.   No results for input(s): LIPASE, AMYLASE in the last 168 hours. No results for input(s): AMMONIA in the last 168 hours.  ABG    Component Value Date/Time   PHART 7.342 (L) 03/01/2019 1051   PCO2ART 47.7 03/01/2019 1051   PO2ART 263.0 (H) 03/01/2019 1051   HCO3 26.1 03/01/2019 1051   TCO2 28 03/01/2019 1051   ACIDBASEDEF 2.0 03/01/2019 0341   O2SAT 100.0 03/01/2019 1051     Coagulation Profile: No results for input(s): INR, PROTIME in the last 168 hours.  Cardiac Enzymes: No results for input(s): CKTOTAL, CKMB, CKMBINDEX, TROPONINI in the last 168 hours.  HbA1C: No results found for: HGBA1C  CBG: Recent Labs  Lab 02/28/19 2033 03/01/19 0004 03/01/19 0321 03/01/19 0737 03/01/19 1122  GLUCAP 108* 155* 175* 114* 98   The patient is critically ill with multiple organ system failure  and requires high complexity decision making for assessment and support, frequent evaluation and titration of therapies, advanced monitoring, review of radiographic studies and interpretation of complex data.   Critical Care Time devoted to patient care services, exclusive of separately billable  procedures, described in this note is 50 minutes.   Kipp Brood, MD [facility] ICU Physician [place]  Pager: [phone] Mobile: [phone] After hours: [phone].  03/01/2019, 5:39 PM      03/01/2019, 5:39 PM

## 2019-03-01 NOTE — Progress Notes (Signed)
Nutrition Follow-up RD working remotely.  DOCUMENTATION CODES:   Not applicable  INTERVENTION:   Now that patient has been unproned, increase TF:  Increase Vital AF 1.2 by 10 ml every 4 hours to goal rate of 45 ml/h  Pro-stat 60 ml TID  Provides 1896 kcal, 171 gm protein, 876 ml free water daily  Per ASPEN and SCCM guidelines, recommend continue gastric feedings during prone positioning, keeping HOB elevated (reverse trendelenburg) to at least 10-25 degrees.   NUTRITION DIAGNOSIS:   Inadequate oral intake related to inability to eat as evidenced by NPO status.  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 Admitted with fever, cough, AMS, and respiratory distress. COVID-19 positive.  Patient was proned yesterday, unproned at 9:30 this morning.  Remains on CRRT.  Remains intubated on ventilator support. MV: 11.3 L/min Temp (24hrs), Avg:97.3 F (36.3 C), Min:96.5 F (35.8 C), Max:98.2 F (36.8 C) MAP >/= 60 this morning   Labs reviewed. Sodium 134 (L) CBG's: 175-114-98  Medications reviewed and include Solu-cortef, Novolog 6 units every 4 hours for TF coverage + SSI, Lantus, Senokot.  Currently receiving trickle TF with Vital AF 1.2 at 20 ml/h via OGT.   I/O +1.9 L since admission Currently 2.1 kg above admit weight  Diet Order:   Diet Order            Diet NPO time specified  Diet effective now              EDUCATION NEEDS:   No education needs have been identified at this time  Skin:  Skin Assessment: Skin Integrity Issues: Skin Integrity Issues:: Stage II Stage II: nose  Last BM:  no BM documented  Height:   Ht Readings from Last 1 Encounters:  02/24/19 5\' 5"  (1.651 m)    Weight:   Wt Readings from Last 1  Encounters:  03/01/19 79.8 kg   02/25/19 86.8 kg  02/23/19          77.7 kg (BMI=28.5)   Ideal Body Weight:  61.8 kg  BMI:  Body mass index is 29.28 kg/m.  Estimated Nutritional Needs:   Kcal:  1820  Protein:  155-195 gm  Fluid:  2.2 L    Molli Barrows, RD, LDN, CNSC Pager [phone] After Hours Pager [phone]

## 2019-03-01 NOTE — TOC Initial Note (Signed)
Transition of Care [facility]) - Initial/Assessment Note    Patient Details  Name: Jonathan [name] MRN: [id number] Date of Birth: 03/23/1971  [facility]) CM/SW Contact:    Maryclare Labrador, RN Phone Number: 03/01/2019, 8:31 AM  Clinical Narrative:     PTA independent - recently arrived from [place] for work.  Pt has family in [place].  Pt is positive for COVID, now in cardiogenic shock on pressors and requiring CRRT for AKI.                Expected Discharge Plan: (CM unsure at this time of dispostion as pt remains critically ill) Barriers to Discharge: Continued Medical Work up(COVID positive on CRRT)   Patient Goals and CMS Choice        Expected Discharge Plan and Services Expected Discharge Plan: (CM unsure at this time of dispostion as pt remains critically ill)       Living arrangements for the past 2 months: Single Family Home                                      Prior Living Arrangements/Services Living arrangements for the past 2 months: Single Family Home Lives with:: Friends                   Activities of Daily Living      Permission Sought/Granted                  Emotional Assessment              Admission diagnosis:  sob Patient Active Problem List   Diagnosis Date Noted  . Pressure injury of skin 02/28/2019  . ARDS (adult respiratory distress syndrome) ([place]) 02/23/2019   PCP:  Patient, No Pcp Per Pharmacy:  No Pharmacies Listed    Social Determinants of Health (SDOH) Interventions    Readmission Risk Interventions No flowsheet data found.

## 2019-03-01 NOTE — Progress Notes (Signed)
ANTICOAGULATION CONSULT NOTE  Pharmacy Consult per for heparin Indication: COVID+ with elevated d-dimer and continued clotting of CRRT   Patient Measurements: Height: 5\' 5"  (165.1 cm) Weight: 175 lb 14.8 oz (79.8 kg) IBW/kg (Calculated) : 61.5 Heparin dosing weight: 78.6  Vital Signs: Temp: 97.2 F (36.2 C) (04/27 0952) Temp Source: Axillary (04/27 0952) BP: 104/64 (04/27 1200) Pulse Rate: 58 (04/27 1245)  Labs: Recent Labs    02/27/19 0331 02/27/19 1320  02/28/19 0343 02/28/19 1527  03/01/19 0341 03/01/19 0355 03/01/19 1051  HGB 11.6*  --    < > 12.0*  --    < > 10.5* 10.9* 10.5*  HCT 35.8*  --    < > 38.7*  --    < > 31.0* 34.8* 31.0*  PLT 115*  --   --  144*  --   --   --  179  --   HEPARINUNFRC  --  0.50  --  0.42  --   --   --  0.44  --   CREATININE 3.16*  --    < > 3.06* 2.86*  --   --  2.80*  --    < > = values in this interval not displayed.     Assessment: 48 yo M admitted with respiratory distress, cough and AMS found to be COVID+. D-dimers elevated on 4/21 at 11.18.  Patient continues on CRRT which was started 4/22. Pharmacy consulted to dose heparin drip due concerns for increased coagulopathy with elevated d-dimer and COVID-19 diagnosis.   Heparin level is therapeutic at 0.44. Hemoglobin stable at 10.5, plts 179.   Goal of Therapy:  Heparin level 0.3 - 0.7 units/mL Monitor platelets by anticoagulation protocol: Yes   Plan:  Continue heparin gtt at 1250 units/hr  Monitor daily anti-Xa levels, CBCs, and s/sx of bleeding  Isaias Sakai, Pharm D PGY1 Pharmacy Resident  Phone [phone] Please use AMION for clinical pharmacists numbers  03/01/2019      1:01 PM

## 2019-03-02 ENCOUNTER — Other Ambulatory Visit: Payer: Self-pay

## 2019-03-02 DIAGNOSIS — N179 Acute kidney failure, unspecified: Secondary | ICD-10-CM

## 2019-03-02 LAB — CBC
HCT: 36.9 % — ABNORMAL LOW (ref 39.0–52.0)
Hemoglobin: 11.4 g/dL — ABNORMAL LOW (ref 13.0–17.0)
MCH: 29 pg (ref 26.0–34.0)
MCHC: 30.9 g/dL (ref 30.0–36.0)
MCV: 93.9 fL (ref 80.0–100.0)
Platelets: 204 10*3/uL (ref 150–400)
RBC: 3.93 MIL/uL — ABNORMAL LOW (ref 4.22–5.81)
RDW: 13.9 % (ref 11.5–15.5)
WBC: 13.1 10*3/uL — ABNORMAL HIGH (ref 4.0–10.5)
nRBC: 0.3 % — ABNORMAL HIGH (ref 0.0–0.2)

## 2019-03-02 LAB — GLUCOSE, CAPILLARY
Glucose-Capillary: 107 mg/dL — ABNORMAL HIGH (ref 70–99)
Glucose-Capillary: 129 mg/dL — ABNORMAL HIGH (ref 70–99)
Glucose-Capillary: 135 mg/dL — ABNORMAL HIGH (ref 70–99)
Glucose-Capillary: 143 mg/dL — ABNORMAL HIGH (ref 70–99)
Glucose-Capillary: 204 mg/dL — ABNORMAL HIGH (ref 70–99)
Glucose-Capillary: 78 mg/dL (ref 70–99)

## 2019-03-02 LAB — RENAL FUNCTION PANEL
Albumin: 2.5 g/dL — ABNORMAL LOW (ref 3.5–5.0)
Albumin: 2.7 g/dL — ABNORMAL LOW (ref 3.5–5.0)
Anion gap: 9 (ref 5–15)
Anion gap: 10 (ref 5–15)
BUN: 31 mg/dL — ABNORMAL HIGH (ref 6–20)
BUN: 33 mg/dL — ABNORMAL HIGH (ref 6–20)
CO2: 24 mmol/L (ref 22–32)
CO2: 25 mmol/L (ref 22–32)
Calcium: 8.2 mg/dL — ABNORMAL LOW (ref 8.9–10.3)
Calcium: 8.5 mg/dL — ABNORMAL LOW (ref 8.9–10.3)
Chloride: 102 mmol/L (ref 98–111)
Chloride: 102 mmol/L (ref 98–111)
Creatinine, Ser: 2.31 mg/dL — ABNORMAL HIGH (ref 0.61–1.24)
Creatinine, Ser: 2.37 mg/dL — ABNORMAL HIGH (ref 0.61–1.24)
GFR calc Af Amer: 37 mL/min — ABNORMAL LOW (ref >60)
GFR calc Af Amer: 36 mL/min — ABNORMAL LOW (ref >60)
GFR calc non Af Amer: 32 mL/min — ABNORMAL LOW (ref >60)
GFR calc non Af Amer: 31 mL/min — ABNORMAL LOW (ref >60)
Glucose, Bld: 211 mg/dL — ABNORMAL HIGH (ref 70–99)
Glucose, Bld: 113 mg/dL — ABNORMAL HIGH (ref 70–99)
Phosphorus: 4.1 mg/dL (ref 2.5–4.6)
Phosphorus: 3.1 mg/dL (ref 2.5–4.6)
Potassium: 5.1 mmol/L (ref 3.5–5.1)
Potassium: 4.2 mmol/L (ref 3.5–5.1)
Sodium: 135 mmol/L (ref 135–145)
Sodium: 137 mmol/L (ref 135–145)

## 2019-03-02 LAB — POCT I-STAT 7, (LYTES, BLD GAS, ICA,H+H)
Acid-base deficit: 2 mmol/L (ref 0.0–2.0)
Bicarbonate: 25.6 mmol/L (ref 20.0–28.0)
Calcium, Ion: 1.23 mmol/L (ref 1.15–1.40)
HCT: 34 % — ABNORMAL LOW (ref 39.0–52.0)
Hemoglobin: 11.6 g/dL — ABNORMAL LOW (ref 13.0–17.0)
O2 Saturation: 92 %
Patient temperature: 97.5
Potassium: 5.2 mmol/L — ABNORMAL HIGH (ref 3.5–5.1)
Sodium: 136 mmol/L (ref 135–145)
TCO2: 27 mmol/L (ref 22–32)
pCO2 arterial: 53.7 mmHg — ABNORMAL HIGH (ref 32.0–48.0)
pH, Arterial: 7.283 — ABNORMAL LOW (ref 7.350–7.450)
pO2, Arterial: 72 mmHg — ABNORMAL LOW (ref 83.0–108.0)

## 2019-03-02 LAB — COOXEMETRY PANEL
Carboxyhemoglobin: 1.2 % (ref 0.5–1.5)
Methemoglobin: 2.3 % — ABNORMAL HIGH (ref 0.0–1.5)
O2 Saturation: 83.8 %
Total hemoglobin: 11.8 g/dL — ABNORMAL LOW (ref 12.0–16.0)

## 2019-03-02 LAB — HEPARIN LEVEL (UNFRACTIONATED)
Heparin Unfractionated: 0.48 IU/mL (ref 0.30–0.70)
Heparin Unfractionated: 0.11 IU/mL — ABNORMAL LOW (ref 0.30–0.70)

## 2019-03-02 LAB — MAGNESIUM: Magnesium: 2.7 mg/dL — ABNORMAL HIGH (ref 1.7–2.4)

## 2019-03-02 LAB — INTERLEUKIN-6, PLASMA: Interleukin-6, Plasma: >3000 pg/mL — AB (ref 0.0–12.2)

## 2019-03-02 MED ORDER — FENTANYL CITRATE (PF) 2500 MCG/50ML IJ SOLN
50.0000 ug/h | Status: DC
  Administered 2019-03-02 – 2019-03-06 (×5): 200 ug/h via INTRAVENOUS
  Filled 2019-03-02 (×7): qty 100

## 2019-03-02 MED ORDER — INSULIN GLARGINE 100 UNIT/ML ~~LOC~~ SOLN
10.0000 [IU] | Freq: Every day | SUBCUTANEOUS | Status: DC
  Filled 2019-03-02 (×2): qty 0.1

## 2019-03-02 MED ORDER — VITAL AF 1.2 CAL PO LIQD
1000.0000 mL | ORAL | Status: DC
  Administered 2019-03-02 – 2019-03-09 (×6): 1000 mL

## 2019-03-02 MED ORDER — VECURONIUM BROMIDE 10 MG IV SOLR
10.0000 mg | Freq: Once | INTRAVENOUS | Status: AC
  Administered 2019-03-02: 10 mg via INTRAVENOUS

## 2019-03-02 MED ORDER — VECURONIUM BROMIDE 10 MG IV SOLR
10.0000 mg | Freq: Four times a day (QID) | INTRAVENOUS | Status: DC | PRN
  Administered 2019-03-02 – 2019-03-03 (×6): 10 mg via INTRAVENOUS
  Filled 2019-03-02 (×6): qty 10

## 2019-03-02 MED ORDER — MIDAZOLAM 50MG/50ML (1MG/ML) PREMIX INFUSION
0.0000 mg/h | INTRAVENOUS | Status: DC
  Administered 2019-03-02: 8 mg/h via INTRAVENOUS
  Administered 2019-03-02: 2 mg/h via INTRAVENOUS
  Administered 2019-03-02 – 2019-03-06 (×16): 8 mg/h via INTRAVENOUS
  Administered 2019-03-06: 18:00:00 4 mg/h via INTRAVENOUS
  Administered 2019-03-07 (×2): 3 mg/h via INTRAVENOUS
  Administered 2019-03-07: 01:00:00 7 mg/h via INTRAVENOUS
  Administered 2019-03-08: 6 mg/h via INTRAVENOUS
  Administered 2019-03-08: 04:00:00 5 mg/h via INTRAVENOUS
  Administered 2019-03-08: 6 mg/h via INTRAVENOUS
  Administered 2019-03-09: 8 mg/h via INTRAVENOUS
  Administered 2019-03-09: 7 mg/h via INTRAVENOUS
  Administered 2019-03-09 (×2): 8 mg/h via INTRAVENOUS
  Administered 2019-03-10: 04:00:00 7 mg/h via INTRAVENOUS
  Filled 2019-03-02 (×31): qty 50

## 2019-03-02 NOTE — Care Management (Signed)
CM attempted to reach out to [place] with [employer] however resource not available and CM was unable to leave VM.

## 2019-03-02 NOTE — Progress Notes (Addendum)
PCCM Progress note  I discussed with Dr. Elsworth Soho about the clinical situation of patient. I have taken care of him last week and have discussed his situation with family from [place] via interpretor on admission.   Reviewed clinical status via chart. I agree with DNR status as prognosis is very poor. Coding him in setting of cardiac arrest would be potentially inappropriate and medically ineffective.  Marshell Garfinkel MD [facility] 03/02/2019, 6:27 PM

## 2019-03-02 NOTE — Progress Notes (Addendum)
NAME:  Jonathan [name], MRN:  [id number], DOB:  1970-12-31, LOS: 7 ADMISSION DATE:  02/23/2019, CONSULTATION DATE:  02/23/2019 REFERRING MD:  Lianne Cure ED, CHIEF COMPLAINT:  Respiratory failure   Brief History   48 year old man intubated for severe respiratory failure requiring prone ventilation and nitric oxide for ARDS related to COVID-19.  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  Past Medical History  Limited information. T2DM recently diagnosed on OHA.   Significant Hospital Events   4/21 admitted and immediately proned on inverse ratio PC ventilation with high PEEP to maintain saturation >85%, Started on NO at 40ppm. 4/22- Tocluzimab X 1 4/21-4/24 Proned daily  Consults:  PCCM 4/21  Procedures:  4/21 - ETT 7.5 >> 4/21 - RIJ [facility] >>  Significant Diagnostic Tests:  POC U/S 4/21: Bilateral interstitial pattern with bilateral dense consolidation at bases. Echo shows low-normal LVSF. RV moderately dilated with septal shift.   Micro Data:  Blood cultures -PND SARS-CoV2 - POSITIVE   Antimicrobials:  Azithromycin 4/21 >>4/25 Ceftriaxone 4/21 >>4/27 Hydroxychloroquine 4/21 > 4/22  Interim history/subjective:  He remains critically ill  FiO2 back up to 100%/PEEP remains at 18 Supine position last 3 days Anuric, remains on CRRT   Objective   Blood pressure 110/67, pulse 66, temperature 98.6 F (37 C), temperature source Axillary, resp. rate (!) 27, height 5\' 5"  (1.651 m), weight 76.6 kg, SpO2 92 %. CVP:  [12 mmHg] 12 mmHg  Vent Mode: PRVC FiO2 (%):  [60 %-100 %] 100 % Set Rate:  [30 bmp] 30 bmp Vt Set:  [360 mL] 360 mL PEEP:  [18 cmH20] 18 cmH20 Plateau Pressure:  [27 cmH20-34 cmH20] 27 cmH20   Intake/Output Summary (Last 24 hours) at 03/02/2019 1118 Last data filed at 03/02/2019 1000 Gross per 24 hour  Intake 2677.39 ml  Output 3347 ml  Net  -669.61 ml   Filed Weights   02/28/19 0344 03/01/19 0455 03/02/19 0500  Weight: 81.6 kg 79.8 kg 76.6 kg   Examination: Gen:      Moderately overweight. Sedated. supine HEENT:  ETT and OGT in place. Small upper lip ulceration.  Right periorbital swelling Lungs:    Clear anteriorly.  Crackles at bases have improved.  Asynchrony + CV:         Regular rate and rhythm; no murmurs Abd:      + bowel sounds; soft, non-tender; no palpable masses, no distension Ext: Mild edema edema; adequate peripheral perfusion Skin:      Warm and dry; no rash Neuro: RASS-3  Resolved Hospital Problem list     Assessment & Plan:  48 year old with severe ARDS, multiorgan failure due to COVID  ARDS from COVID-19, requiring mechanical ventilation. Prone position again for 16 hours daily, supine for 8 hours-will continue until FiO2 down to 50% Continue inhaled NO until PEEP< 15. Not a candidate for ECMO vecuronium x1 for vent asynchrony  Cardiogenic shock due to acute cor pulmonale. RV dysfunction noted on bedside echo- related to ARDS dc dobutamine   Diabetes mellitus SSI Continue Lantus 10 units and tube feed coverage  AKI -On CVVH.  Increase fluid removal 50/hour ct full anticoagulation with heparin as he has clotting issues of circuit   Acute encephalopathy-goal RA SS -5 Discontinue Precedex and use Versed drip Continue clonazepam, oxycodone and fentanyl drip to reduce fentanyl gtt usage  Best practice:  Diet: PEPUP protocol  now that supine. Pain/Anxiety/Delirium protocol (if indicated): Fentanyl, predex, klonopin, Oxycodone,  PRN lorazepam VAP protocol (if indicated): Bundle in place. DVT prophylaxis:Heparin gtt GI prophylaxis: Famotidine Glucose control: Lantus, SSI coverage. Mobility: Bedrest Code Status: Full Family Communication: Family in [place].   spouse updated over the phone 4/27. Disposition: ICU.   The patient is critically ill with multiple organ systems failure and  requires high complexity decision making for assessment and support, frequent evaluation and titration of therapies, application of advanced monitoring technologies and extensive interpretation of multiple databases. Critical Care Time devoted to patient care services described in this note independent of APP/resident  time is 35 minutes.    Jonathan Mead MD. [name]. [name] Pager [phone] If no response call [phone]     03/02/2019, 11:18 AM   Addendum - He was proned again today & only had marginal improvement in oxygenation. wife updated 4/27 & 28 via interpreter- she seems too overwhelmed to make any decisions. He has severe COVID ARDS & AKI, prognosis is extremely poor & he will not survive CPR. Will place no CPR orders.  Leanna Sato Elsworth Soho MD

## 2019-03-02 NOTE — Progress Notes (Addendum)
Jonathan [name] from the  [employer] [facility] [employer] workers that come her with  [employer] called to give information on insurance .Her number is [phone]. I called Elenor Quinones case manager and admitting and relayed this information

## 2019-03-02 NOTE — Progress Notes (Signed)
[facility] per for heparin Indication: COVID+ with elevated d-dimer   Patient Measurements: Height: 5\' 5"  (165.1 cm) Weight: 168 lb 14 oz (76.6 kg) IBW/kg (Calculated) : 61.5 Heparin dosing weight: 78.6  Vital Signs: Temp: 98.6 F (37 C) (04/28 0927) Temp Source: Axillary (04/28 0927) BP: 110/67 (04/28 1000) Pulse Rate: 66 (04/28 1045)  Labs: Recent Labs    02/28/19 0343  03/01/19 0355 03/01/19 1051 03/01/19 1500 03/02/19 0321 03/02/19 0325  HGB 12.0*   < > 10.9* 10.5*  --  11.6* 11.4*  HCT 38.7*   < > 34.8* 31.0*  --  34.0* 36.9*  PLT 144*  --  179  --   --   --  204  HEPARINUNFRC 0.42  --  0.44  --   --   --  0.48  CREATININE 3.06*   < > 2.80*  --  2.68*  --  2.31*   < > = values in this interval not displayed.     Assessment: 48 yo M admitted with respiratory distress, cough and AMS found to be COVID+. D-dimers elevated on 4/21 at 11.18.  Patient continues on CRRT which was started 4/22. Pharmacy consulted to dose heparin drip due concerns for increased coagulopathy with elevated d-dimer and COVID-19 diagnosis.   Heparin level is therapeutic at 0.44. Reducing goal heparin level to 0.1-0.25 units/ml and monitor for clotting of CRRT machine.   Hemoglobin stable at 11.4, plts 204. No bleeding noted.   Goal of Therapy:  Heparin level 0.1 - 0.25 units/mL Monitor platelets by anticoagulation protocol: Yes   Plan:  Decrease heparin gtt to 550 units/hr  8 hour confirmatory anti-Xa level Monitor daily anti-Xa levels, CBCs, and s/sx of bleeding  Isaias Sakai, Pharm D PGY1 Pharmacy Resident  Phone [phone] Please use AMION for clinical pharmacists numbers  03/02/2019      11:19 AM

## 2019-03-02 NOTE — Progress Notes (Signed)
RT decreased NO to 10ppm per order. Patient's spo2 decreased to 86%. RT unable to get sats to come back up so RT increased NO to 20ppm. Patient unable to tolerated nitric wean to 10ppm at this time. RT will reattempt nitric wean at 0000 rounds. Patient's spo2 is currently 90%. RN is aware. RT will continue to monitor.

## 2019-03-02 NOTE — Progress Notes (Signed)
ANTICOAGULATION CONSULT NOTE  Pharmacy Consult per for heparin Indication: COVID+ with elevated d-dimer   Patient Measurements: Height: 5\' 5"  (165.1 cm) Weight: 168 lb 14 oz (76.6 kg) IBW/kg (Calculated) : 61.5 Heparin dosing weight: 78.6  Vital Signs: Temp: 97.6 F (36.4 C) (04/28 1300) Temp Source: Axillary (04/28 1300) BP: 149/88 (04/28 1800) Pulse Rate: 110 (04/28 1845)  Labs: Recent Labs    02/28/19 0343  03/01/19 0355 03/01/19 1051 03/01/19 1500 03/02/19 0321 03/02/19 0325 03/02/19 1600 03/02/19 1802  HGB 12.0*   < > 10.9* 10.5*  --  11.6* 11.4*  --   --   HCT 38.7*   < > 34.8* 31.0*  --  34.0* 36.9*  --   --   PLT 144*  --  179  --   --   --  204  --   --   HEPARINUNFRC 0.42  --  0.44  --   --   --  0.48  --  0.11*  CREATININE 3.06*   < > 2.80*  --  2.68*  --  2.31* 2.37*  --    < > = values in this interval not displayed.     Assessment: 48 yo M admitted with respiratory distress, cough and AMS found to be COVID+. D-dimers elevated on 4/21 at 11.18.  Patient continues on CRRT which was started 4/22. Pharmacy consulted to dose heparin drip due concerns for increased coagulopathy with elevated d-dimer and COVID-19 diagnosis.   Reducing goal heparin level to 0.1-0.25 units/ml and monitor for clotting of CRRT machine.   Hemoglobin stable at 11.4, plts 204. No bleeding noted.   PM heparin level = 0.11  Goal of Therapy:  Heparin level 0.1 - 0.25 units/mL Monitor platelets by anticoagulation protocol: Yes   Plan:  Continue heparin gtt at 550 units/hr  Monitor daily anti-Xa levels, CBCs, and s/sx of bleeding  Thank you Anette Guarneri, PharmD [phone] Please use AMION for clinical pharmacists numbers  03/02/2019      7:12 PM

## 2019-03-02 NOTE — Procedures (Signed)
Admit: 02/23/2019 LOS: 21  89M COVID with cardiogenic shock, hypoxic VDRF, AKI on CRRT  Current CRRT Prescription: Start Date: 4/22 Catheter: IJ BFR: 150 Pre Blood Pump: 300 4k DFR: 1800 4k Replacement Rate: 300 4k Goal UF: 16m/hr net neg Anticoagulation: systemic heparin Clotting: infrequent  S:  Pt not examined, d/w CCM MD and ICU RN  K 5.1, P 4.1, HCO3 24  UF rate 225mhr neg net  Supine   FIO2 70-100%; PEEP 18, inh NO  on dobtamine  Anuric; has foley  O: 04/27 0701 - 04/28 0700 In: 2739.4 [I.V.:1389.4; NG/GT:1350] Out: 356237Urine:15]  Filed Weights   02/28/19 0344 03/01/19 0455 03/02/19 0500  Weight: 81.6 kg 79.8 kg 76.6 kg    Recent Labs  Lab 03/01/19 0355  03/01/19 1500 03/02/19 0321 03/02/19 0325  NA 137   < > 135 136 135  K 5.2*   < > 5.0 5.2* 5.1  CL 104  --  101  --  102  CO2 26  --  24  --  24  GLUCOSE 147*  --  149*  --  211*  BUN 31*  --  30*  --  31*  CREATININE 2.80*  --  2.68*  --  2.31*  CALCIUM 7.7*  --  8.2*  --  8.2*  PHOS 4.2  --  3.5  --  4.1   < > = values in this interval not displayed.   Recent Labs  Lab 02/26/19 0545  02/27/19 0331  02/28/19 0343  03/01/19 0355 03/01/19 1051 03/02/19 0321 03/02/19 0325  WBC 14.0*  --  14.3*  --  12.4*  --  12.2*  --   --  13.1*  NEUTROABS 12.1*  --  11.9*  --  9.3*  --   --   --   --   --   HGB 11.3*   < > 11.6*   < > 12.0*   < > 10.9* 10.5* 11.6* 11.4*  HCT 35.7*   < > 35.8*   < > 38.7*   < > 34.8* 31.0* 34.0* 36.9*  MCV 93.0  --  90.9  --  93.5  --  94.8  --   --  93.9  PLT 141*  --  115*  --  144*  --  179  --   --  204   < > = values in this interval not displayed.    Scheduled Meds: . chlorhexidine gluconate (MEDLINE KIT)  15 mL Mouth Rinse BID  . clonazePAM  2 mg Per Tube Q6H  . cloNIDine  0.3 mg Oral Q6H  . docusate  100 mg Per Tube BID  . famotidine  20 mg Per Tube Daily  . feeding supplement (PRO-STAT SUGAR FREE 64)  60 mL Per Tube TID  . fentaNYL  2 patch  Transdermal Q72H   And  . fentaNYL  1 patch Transdermal Q72H  . hydrocortisone sod succinate (SOLU-CORTEF) inj  25 mg Intravenous Q12H   Followed by  . [START ON 03/04/2019] hydrocortisone sod succinate (SOLU-CORTEF) inj  25 mg Intravenous Once  . insulin aspart  0-15 Units Subcutaneous Q4H  . insulin aspart  6 Units Subcutaneous Q4H  . insulin glargine  8 Units Subcutaneous Daily  . mouth rinse  15 mL Mouth Rinse 10 times per day  . oxyCODONE  10 mg Oral Q4H  . QUEtiapine  50 mg Oral BID  . sennosides  5 mL Oral BID   Continuous Infusions: .  prismasol BGK 4/2.5 1,000 mL/hr at 03/02/19 0802  .  prismasol BGK 4/2.5 300 mL/hr at 03/01/19 2310  . sodium chloride    . sodium chloride    . dexmedetomidine (PRECEDEX) IV infusion 1.2 mcg/kg/hr (03/02/19 0800)  . DOBUTamine 0.5 mcg/kg/min (03/02/19 0800)  . feeding supplement (VITAL AF 1.2 CAL) 1,000 mL (03/01/19 1434)  . fentaNYL infusion INTRAVENOUS    . heparin 1,250 Units/hr (03/02/19 0800)  . prismasol BGK 4/2.5 1,800 mL/hr at 03/02/19 0643   PRN Meds:.Place/Maintain arterial line **AND** sodium chloride, sodium chloride, acetaminophen, fentaNYL, heparin, LORazepam, midazolam, vecuronium  ABG    Component Value Date/Time   PHART 7.283 (L) 03/02/2019 0321   PCO2ART 53.7 (H) 03/02/2019 0321   PO2ART 72.0 (L) 03/02/2019 0321   HCO3 25.6 03/02/2019 0321   TCO2 27 03/02/2019 0321   ACIDBASEDEF 2.0 03/02/2019 0321   O2SAT 83.8 03/02/2019 0335    A/P 1. Diaalysis dependent AKI 2/2 ATN, COVID19 2. ARDS/Hypoxic VDRF 3. Systolic HF on dobutamine 4. DM2  Cont current pre/post/dialysate flow rate, Cont all 4K.  UF rate d/w CCM, they will adjust as indicated.    Pearson Grippe, MD [facility]

## 2019-03-02 NOTE — Progress Notes (Signed)
I updated patient's wife via  Interpreter that he is fully supported on the ventilator and once again proned . He remains  on continuous dialysis .I told her that he was not showing any signs of progression despite our care .

## 2019-03-02 NOTE — Progress Notes (Signed)
RT assisted RN with turning patients head to the right. Patient tolerating well at this time.

## 2019-03-02 NOTE — Progress Notes (Signed)
PCCM Note  I was contacted by the primary critical care attending, Dr. Elsworth Soho and after personally reviewing patient's medical record and current medical condition, I concur ACLS in the setting of cardiac arrest would be medically futile and that it would not be appropriate to perform.   Rodman Pickle, M.D. [facility] Pulmonary/Critical Care Medicine Pager: [phone] After hours pager: [phone]

## 2019-03-02 NOTE — Progress Notes (Signed)
Patient placed in Prone position with tubes intact .sats 97 .

## 2019-03-03 ENCOUNTER — Inpatient Hospital Stay (HOSPITAL_COMMUNITY): Payer: HRSA Program

## 2019-03-03 LAB — GLUCOSE, CAPILLARY
Glucose-Capillary: 152 mg/dL — ABNORMAL HIGH (ref 70–99)
Glucose-Capillary: 156 mg/dL — ABNORMAL HIGH (ref 70–99)
Glucose-Capillary: 141 mg/dL — ABNORMAL HIGH (ref 70–99)
Glucose-Capillary: 144 mg/dL — ABNORMAL HIGH (ref 70–99)
Glucose-Capillary: 141 mg/dL — ABNORMAL HIGH (ref 70–99)
Glucose-Capillary: 127 mg/dL — ABNORMAL HIGH (ref 70–99)

## 2019-03-03 LAB — RENAL FUNCTION PANEL
Albumin: 2.8 g/dL — ABNORMAL LOW (ref 3.5–5.0)
Albumin: 2.7 g/dL — ABNORMAL LOW (ref 3.5–5.0)
Anion gap: 11 (ref 5–15)
Anion gap: 7 (ref 5–15)
BUN: 31 mg/dL — ABNORMAL HIGH (ref 6–20)
BUN: 34 mg/dL — ABNORMAL HIGH (ref 6–20)
CO2: 26 mmol/L (ref 22–32)
CO2: 25 mmol/L (ref 22–32)
Calcium: 8.5 mg/dL — ABNORMAL LOW (ref 8.9–10.3)
Calcium: 8.3 mg/dL — ABNORMAL LOW (ref 8.9–10.3)
Chloride: 97 mmol/L — ABNORMAL LOW (ref 98–111)
Chloride: 103 mmol/L (ref 98–111)
Creatinine, Ser: 2.57 mg/dL — ABNORMAL HIGH (ref 0.61–1.24)
Creatinine, Ser: 2.46 mg/dL — ABNORMAL HIGH (ref 0.61–1.24)
GFR calc Af Amer: 33 mL/min — ABNORMAL LOW (ref >60)
GFR calc Af Amer: 35 mL/min — ABNORMAL LOW (ref >60)
GFR calc non Af Amer: 28 mL/min — ABNORMAL LOW (ref >60)
GFR calc non Af Amer: 30 mL/min — ABNORMAL LOW (ref >60)
Glucose, Bld: 169 mg/dL — ABNORMAL HIGH (ref 70–99)
Glucose, Bld: 142 mg/dL — ABNORMAL HIGH (ref 70–99)
Phosphorus: 4.1 mg/dL (ref 2.5–4.6)
Phosphorus: 4.1 mg/dL (ref 2.5–4.6)
Potassium: 5.4 mmol/L — ABNORMAL HIGH (ref 3.5–5.1)
Potassium: 5 mmol/L (ref 3.5–5.1)
Sodium: 134 mmol/L — ABNORMAL LOW (ref 135–145)
Sodium: 135 mmol/L (ref 135–145)

## 2019-03-03 LAB — COOXEMETRY PANEL
Carboxyhemoglobin: 0.9 % (ref 0.5–1.5)
Methemoglobin: 2 % — ABNORMAL HIGH (ref 0.0–1.5)
O2 Saturation: 78.7 %
Total hemoglobin: 12.5 g/dL (ref 12.0–16.0)

## 2019-03-03 LAB — CBC WITH DIFFERENTIAL/PLATELET
Abs Immature Granulocytes: 0.3 10*3/uL — ABNORMAL HIGH (ref 0.00–0.07)
Basophils Absolute: 0.2 10*3/uL — ABNORMAL HIGH (ref 0.0–0.1)
Basophils Relative: 1 %
Eosinophils Absolute: 0 10*3/uL (ref 0.0–0.5)
Eosinophils Relative: 0 %
HCT: 38.9 % — ABNORMAL LOW (ref 39.0–52.0)
Hemoglobin: 11.9 g/dL — ABNORMAL LOW (ref 13.0–17.0)
Lymphocytes Relative: 10 %
Lymphs Abs: 1.7 10*3/uL (ref 0.7–4.0)
MCH: 29.2 pg (ref 26.0–34.0)
MCHC: 30.6 g/dL (ref 30.0–36.0)
MCV: 95.3 fL (ref 80.0–100.0)
Metamyelocytes Relative: 1 %
Monocytes Absolute: 0.2 10*3/uL (ref 0.1–1.0)
Monocytes Relative: 1 %
Neutro Abs: 14.3 10*3/uL — ABNORMAL HIGH (ref 1.7–7.7)
Neutrophils Relative %: 86 %
Platelets: 208 10*3/uL (ref 150–400)
Promyelocytes Relative: 1 %
RBC: 4.08 MIL/uL — ABNORMAL LOW (ref 4.22–5.81)
RDW: 14.5 % (ref 11.5–15.5)
WBC: 16.6 10*3/uL — ABNORMAL HIGH (ref 4.0–10.5)
nRBC: 0 /100 WBC
nRBC: 1.1 % — ABNORMAL HIGH (ref 0.0–0.2)

## 2019-03-03 LAB — HEPARIN LEVEL (UNFRACTIONATED)
Heparin Unfractionated: <0.1 IU/mL — ABNORMAL LOW (ref 0.30–0.70)
Heparin Unfractionated: <0.1 IU/mL — ABNORMAL LOW (ref 0.30–0.70)

## 2019-03-03 LAB — MAGNESIUM: Magnesium: 2.6 mg/dL — ABNORMAL HIGH (ref 1.7–2.4)

## 2019-03-03 MED ORDER — LABETALOL HCL 5 MG/ML IV SOLN
5.0000 mg | INTRAVENOUS | Status: DC | PRN
  Administered 2019-03-06 – 2019-03-07 (×3): 5 mg via INTRAVENOUS
  Filled 2019-03-03: qty 4

## 2019-03-03 MED ORDER — INSULIN GLARGINE 100 UNIT/ML ~~LOC~~ SOLN
8.0000 [IU] | Freq: Every day | SUBCUTANEOUS | Status: DC
  Administered 2019-03-04 – 2019-03-13 (×10): 8 [IU] via SUBCUTANEOUS
  Filled 2019-03-03 (×11): qty 0.08

## 2019-03-03 MED ORDER — METOPROLOL TARTRATE 5 MG/5ML IV SOLN
2.5000 mg | INTRAVENOUS | Status: DC | PRN
  Administered 2019-03-03: 2.5 mg via INTRAVENOUS
  Filled 2019-03-03: qty 5

## 2019-03-03 MED ORDER — METOPROLOL TARTRATE 25 MG/10 ML ORAL SUSPENSION
25.0000 mg | Freq: Two times a day (BID) | ORAL | Status: DC
  Administered 2019-03-03 – 2019-03-10 (×13): 25 mg via ORAL
  Filled 2019-03-03 (×16): qty 10

## 2019-03-03 MED ORDER — NALOXEGOL OXALATE 12.5 MG PO TABS
12.5000 mg | ORAL_TABLET | Freq: Once | ORAL | Status: AC
  Administered 2019-03-03: 11:00:00 12.5 mg via ORAL
  Filled 2019-03-03: qty 1

## 2019-03-03 MED ORDER — METOCLOPRAMIDE HCL 5 MG/ML IJ SOLN
10.0000 mg | Freq: Two times a day (BID) | INTRAMUSCULAR | Status: DC
  Administered 2019-03-03 – 2019-03-04 (×3): 10 mg via INTRAVENOUS
  Filled 2019-03-03 (×3): qty 2

## 2019-03-03 MED ORDER — BISACODYL 10 MG RE SUPP
10.0000 mg | Freq: Once | RECTAL | Status: AC
  Administered 2019-03-03: 10 mg via RECTAL
  Filled 2019-03-03: qty 1

## 2019-03-03 MED ORDER — VECURONIUM BROMIDE 10 MG IV SOLR
10.0000 mg | Freq: Once | INTRAVENOUS | Status: AC
  Administered 2019-03-03: 18:00:00 10 mg via INTRAVENOUS

## 2019-03-03 MED ORDER — PRISMASOL BGK 0/2.5 32-2.5 MEQ/L IV SOLN
INTRAVENOUS | Status: DC
  Administered 2019-03-03 – 2019-03-06 (×22): via INTRAVENOUS_CENTRAL
  Filled 2019-03-03 (×34): qty 5000

## 2019-03-03 NOTE — Progress Notes (Signed)
Patient's wife Lorna Few and his daughter Chrissie Noa were able to camera in room to see patient and talk to him . Graciela the interpreter was also on video for them via Elink screen.   Email is [email or web address]  Phone number is [phone]  [email or web address] email if you need her is  [email or web address]

## 2019-03-03 NOTE — Progress Notes (Signed)
Reginal Lutes MD at 0430 to let them know we were going to unprone pt around 5am. Per Elink MD, leave prone until day shift.

## 2019-03-03 NOTE — Progress Notes (Signed)
[place] per for heparin Indication: COVID+ with elevated d-dimer   Patient Measurements: Height: 5\' 5"  (165.1 cm) Weight: 166 lb 14.2 oz (75.7 kg) IBW/kg (Calculated) : 61.5  Vital Signs: Temp: 97.6 F (36.4 C) (04/29 1545) Temp Source: Axillary (04/29 1545) BP: 120/76 (04/29 1500) Pulse Rate: 79 (04/29 1600)  Labs: Recent Labs    03/01/19 0355  03/02/19 0321 03/02/19 0325 03/02/19 1600 03/02/19 1802 03/03/19 0402 03/03/19 0458 03/03/19 1440 03/03/19 1533  HGB 10.9*   < > 11.6* 11.4*  --   --   --  11.9*  --   --   HCT 34.8*   < > 34.0* 36.9*  --   --   --  38.9*  --   --   PLT 179  --   --  204  --   --   --  208  --   --   HEPARINUNFRC 0.44  --   --  0.48  --  0.11*  --  <0.10* <0.10*  --   CREATININE 2.80*   < >  --  2.31* 2.37*  --  2.57*  --   --  2.46*   < > = values in this interval not displayed.     Assessment: 48 yo M admitted with respiratory distress, cough and AMS found to be COVID+. D-dimers elevated on 4/21 at 11.18.  Patient continues on CRRT which was started 4/22. Pharmacy consulted to dose heparin drip due concerns for increased coagulopathy with elevated d-dimer and COVID-19 diagnosis.   AntiXa level continues to be subtherapeutic at <0.10. Hemoglobin stable at 11.9, plts 208. No bleeding noted.   Goal of Therapy:  Heparin level 0.1 - 0.25 units/mL Monitor platelets by anticoagulation protocol: Yes   Plan:  Increase heparin gtt to 750 units/hr  Repeat anti-Xa level in 8 hours  Monitor daily anti-Xa levels, CBCs, and s/sx of bleeding  Erin Hearing PharmD., BCPS Clinical Pharmacist 03/03/2019 4:35 PM

## 2019-03-03 NOTE — Progress Notes (Signed)
RT NOTES: Assisted RN with returning patient to supine position. Wound found under ETT holder on both cheeks. Wound cleaned by RN and replicare dressing placed on both sides, new ETT holder placed. Will continue to monitor.

## 2019-03-03 NOTE — Progress Notes (Addendum)
Assisted in proning patient. No complications during the process. Decreased nitric by half per order to 2.7. Pt is tolerating at this time. RT will continue to monitor.

## 2019-03-03 NOTE — Progress Notes (Signed)
NAME:  Jonathan [name], MRN:  [id number], DOB:  12/01/1970, LOS: 8 ADMISSION DATE:  02/23/2019, CONSULTATION DATE:  02/23/2019 REFERRING MD:  [facility], CHIEF COMPLAINT:  Respiratory failure   Brief History   48 year old man intubated for severe respiratory failure requiring prone ventilation and nitric oxide for ARDS related to COVID-19.  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  Past Medical History   T2DM recently diagnosed on OHA.   Significant Hospital Events   4/21 admitted and immediately proned on inverse ratio PC ventilation with high PEEP to maintain saturation >85%, Started on NO at 40ppm. 4/22- Tocluzimab X 1 4/21-4/24 Proned daily 4/28 proned again, tapered NO to off  Consults:  PCCM 4/21  Procedures:  4/21 - ETT 7.5 >> 4/21 - RIJ CVC [facility] >>  Significant Diagnostic Tests:  POC U/S 4/21: Bilateral interstitial pattern with bilateral dense consolidation at bases. Echo shows low-normal LVSF. RV moderately dilated with septal shift.   Micro Data:  Blood cultures -PND SARS-CoV2 - POSITIVE   Antimicrobials:  Azithromycin 4/21 >>4/25 Ceftriaxone 4/21 >>4/27 Hydroxychloroquine 4/21 > 4/22  Interim history/subjective:   Remains critically ill, on CRRT  Sedated on Versed and fentanyl, proned overnight  Objective   Blood pressure 114/78, pulse 77, temperature 98.1 F (36.7 C), temperature source Axillary, resp. rate (!) 30, height 5\' 5"  (1.651 m), weight 75.7 kg, SpO2 100 %. CVP:  [14 mmHg] 14 mmHg  Vent Mode: PRVC FiO2 (%):  [80 %-100 %] 80 % Set Rate:  [30 bmp] 30 bmp Vt Set:  [360 mL] 360 mL PEEP:  [18 cmH20] 18 cmH20 Plateau Pressure:  [26 cmH20-34 cmH20] 33 cmH20   Intake/Output Summary (Last 24 hours) at 03/03/2019 1029 Last data filed at 03/03/2019 1000 Gross per 24 hour  Intake 1583.88 ml  Output 3931 ml  Net -2347.12 ml    Filed Weights   03/01/19 0455 03/02/19 0500 03/03/19 0440  Weight: 79.8 kg 76.6 kg 75.7 kg   Examination: Gen:      Moderately overweight. Sedated. supine HEENT:  ETT and OGT in place. Facial pressure sores under ETT holder  Lungs:    Clear anteriorly.  Crackles at bases have improved.  Asynchrony + CV:         Regular rate and rhythm; no murmurs Abd:      absent bowel sounds; soft, non-tender; no palpable masses, no distension Ext: Mild edema edema; adequate peripheral perfusion Skin:      Warm and dry; no rash Neuro: RASS-5 , lid edema  Resolved Hospital Problem list     Assessment & Plan:  48 year old with severe ARDS, multiorgan failure due to COVID  ARDS from COVID-19, requiring mechanical ventilation. 4/28 Prone position again for 16 hours daily, supine for 8 hours-will continue until FiO2 down to 50% DC inhaled NO  Not a candidate for ECMO Vecuronium as needed for vent asynchrony  Cardiogenic shock due to acute cor pulmonale. RV dysfunction noted on bedside echo- related to ARDS Off dobutamine   Diabetes mellitus SSI Lantus 10 units held today  AKI -On CVVH.  ct fluid removal 50/hour ct full anticoagulation with heparin as he has clotting issues of circuit  Ileus - related to opioid & NMB , high NG output Restart trickle TFs XR abdomen Add Dulcolax suppository to Colace and senna 1 dose of naloxegol   Acute encephalopathy-goal RA SS -5  while being proned  Versed gtt Continue clonazepam, oxycodone and fentanyl drip to reduce fentanyl gtt usage  Best practice:  Diet: PEPUP protocol now that supine. Pain/Anxiety/Delirium protocol (if indicated): RASS goal -5,Fentanyl, predex, klonopin, Oxycodone,  PRN lorazepam VAP protocol (if indicated): Bundle in place. DVT prophylaxis:Heparin gtt GI prophylaxis: Famotidine Glucose control: Lantus, SSI coverage. Mobility: Bedrest Code Status: Full Family Communication: Family in [place].   wife updated over the phone  4/28. Disposition: ICU.  The patient is critically ill with multiple organ systems failure and requires high complexity decision making for assessment and support, frequent evaluation and titration of therapies, application of advanced monitoring technologies and extensive interpretation of multiple databases. Critical Care Time devoted to patient care services described in this note independent of APP/resident  time is 35 minutes.      Kara Mead MD. [name]. [facility] Pager [phone] If no response call [phone]     03/03/2019, 10:29 AM

## 2019-03-03 NOTE — Progress Notes (Signed)
RT assisted RN with head turn to the left. Patient tolerated well.

## 2019-03-03 NOTE — Progress Notes (Signed)
RT assisted RN with turning patient's head to the right. Patient tolerated well at this time.

## 2019-03-03 NOTE — Progress Notes (Signed)
ANTICOAGULATION CONSULT NOTE  Pharmacy Consult per for heparin Indication: COVID+ with elevated d-dimer   Patient Measurements: Height: 5\' 5"  (165.1 cm) Weight: 166 lb 14.2 oz (75.7 kg) IBW/kg (Calculated) : 61.5  Vital Signs: Temp: 98.5 F (36.9 C) (04/29 0400) Temp Source: Axillary (04/29 0400) BP: 126/78 (04/29 0600) Pulse Rate: 111 (04/29 0600)  Labs: Recent Labs    03/01/19 0355  03/02/19 0321 03/02/19 0325 03/02/19 1600 03/02/19 1802 03/03/19 0402 03/03/19 0458  HGB 10.9*   < > 11.6* 11.4*  --   --   --  11.9*  HCT 34.8*   < > 34.0* 36.9*  --   --   --  38.9*  PLT 179  --   --  204  --   --   --  208  HEPARINUNFRC 0.44  --   --  0.48  --  0.11*  --  <0.10*  CREATININE 2.80*   < >  --  2.31* 2.37*  --  2.57*  --    < > = values in this interval not displayed.     Assessment: 48 yo M admitted with respiratory distress, cough and AMS found to be COVID+. D-dimers elevated on 4/21 at 11.18.  Patient continues on CRRT which was started 4/22. Pharmacy consulted to dose heparin drip due concerns for increased coagulopathy with elevated d-dimer and COVID-19 diagnosis.    AntiXa level subtherapeutic at <0.10. Hemoglobin stable at 11.9, plts 208. No bleeding noted.   Goal of Therapy:  Heparin level 0.1 - 0.25 units/mL Monitor platelets by anticoagulation protocol: Yes   Plan:  Increase heparin gtt to 650 units/hr  Repeat anti-Xa level in 8 hours  Monitor daily anti-Xa levels, CBCs, and s/sx of bleeding  Isaias Sakai, Pharm D PGY1 Pharmacy Resident  Phone [phone] Please use AMION for clinical pharmacists numbers  03/03/2019      7:04 AM

## 2019-03-03 NOTE — Progress Notes (Signed)
[place] Progress Note Patient Name: Jonathan [name] DOB: 1971/01/16 MRN: [id number]   Date of Service  03/03/2019  HPI/Events of Note  CVP = 14. BP = 124/84 and HR = 125.   eICU Interventions  Will order: 1. Metoprolol 2.5 mg IV Q 3 hours PRN HR > 115.     Intervention Category Major Interventions: Other:  Aquita Simmering Cornelia Copa 03/03/2019, 3:35 AM

## 2019-03-03 NOTE — Progress Notes (Signed)
RT NOTES: RT assisted RN with turning patient's head while proned. ETT was found to be at 21 at the lips, advanced back to 23 at the lips. Moved ETT from right to left at this time. Patient tolerated well.

## 2019-03-03 NOTE — Progress Notes (Signed)
[place] Progress Note Patient Name: Jonathan [name] DOB: [birth year] MRN: [id number]   Date of Service  03/03/2019  HPI/Events of Note  RN requesting prn medications for elevated blood pressure  eICU Interventions  Labetalol 5-10 mg iv Q 4 hours prn SBP > 160        Cortnie Ringel U Yvonne Petite 03/03/2019, 9:13 PM

## 2019-03-03 NOTE — Progress Notes (Signed)
RT NOTES: Order to advance tube x3 cm per CXR from this morning. Previously advanced ETT x 2 cm at 0737 this morning post CXR, now at this time advanced ETT x 1 additional cm per order. ETT now at 24 at the lip.

## 2019-03-03 NOTE — Progress Notes (Signed)
Paged renal about K+ 5.4.

## 2019-03-03 NOTE — Procedures (Signed)
Admit: 02/23/2019 LOS: 9  59M COVID with cardiogenic shock, hypoxic VDRF, AKI on CRRT  Current CRRT Prescription: Start Date: 4/22 Catheter: IJ BFR: 1240 Pre Blood Pump: 300 4k DFR: 1800 4k Replacement Rate: 300 4k Goal UF: 72m/hr net neg Anticoagulation: systemic heparin Clotting: more frequent  S:  Pt not examined, d/w CCM MD and ICU RN  K 5.4, P 4.1, HCO3 26  1.2L neg yesterday  FIO2 80%; PEEP 18, inh NO;   Anuric; has foley  O: 04/28 0701 - 04/29 0700 In: 1911.3 [I.V.:971.3; NG/GT:940] Out: 3131   Filed Weights   03/01/19 0455 03/02/19 0500 03/03/19 0440  Weight: 79.8 kg 76.6 kg 75.7 kg    Recent Labs  Lab 03/02/19 0325 03/02/19 1600 03/03/19 0402  NA 135 137 134*  K 5.1 4.2 5.4*  CL 102 102 97*  CO2 '24 25 26  ' GLUCOSE 211* 113* 169*  BUN 31* 33* 31*  CREATININE 2.31* 2.37* 2.57*  CALCIUM 8.2* 8.5* 8.5*  PHOS 4.1 3.1 4.1   Recent Labs  Lab 02/27/19 0331  02/28/19 0343  03/01/19 0355  03/02/19 0321 03/02/19 0325 03/03/19 0458  WBC 14.3*  --  12.4*  --  12.2*  --   --  13.1* 16.6*  NEUTROABS 11.9*  --  9.3*  --   --   --   --   --  PENDING  HGB 11.6*   < > 12.0*   < > 10.9*   < > 11.6* 11.4* 11.9*  HCT 35.8*   < > 38.7*   < > 34.8*   < > 34.0* 36.9* 38.9*  MCV 90.9  --  93.5  --  94.8  --   --  93.9 95.3  PLT 115*  --  144*  --  179  --   --  204 208   < > = values in this interval not displayed.    Scheduled Meds: . chlorhexidine gluconate (MEDLINE KIT)  15 mL Mouth Rinse BID  . clonazePAM  2 mg Per Tube Q6H  . docusate  100 mg Per Tube BID  . famotidine  20 mg Per Tube Daily  . feeding supplement (PRO-STAT SUGAR FREE 64)  60 mL Per Tube TID  . fentaNYL  2 patch Transdermal Q72H   And  . fentaNYL  1 patch Transdermal Q72H  . insulin aspart  0-15 Units Subcutaneous Q4H  . insulin aspart  6 Units Subcutaneous Q4H  . insulin glargine  10 Units Subcutaneous Daily  . mouth rinse  15 mL Mouth Rinse 10 times per day  . oxyCODONE  10 mg  Oral Q4H  . QUEtiapine  50 mg Oral BID  . sennosides  5 mL Oral BID   Continuous Infusions: .  prismasol BGK 4/2.5 1,000 mL/hr at 03/03/19 0820  .  prismasol BGK 4/2.5 300 mL/hr at 03/02/19 1555  . sodium chloride    . sodium chloride    . feeding supplement (VITAL AF 1.2 CAL) 1,000 mL (03/02/19 1531)  . fentaNYL infusion INTRAVENOUS 200 mcg/hr (03/03/19 0800)  . heparin 650 Units/hr (03/03/19 0800)  . midazolam 8 mg/hr (03/03/19 0806)  . prismasol BGK 4/2.5 1,800 mL/hr at 03/03/19 0734   PRN Meds:.Place/Maintain arterial line **AND** sodium chloride, sodium chloride, acetaminophen, fentaNYL, heparin, LORazepam, metoprolol tartrate, midazolam, vecuronium  ABG    Component Value Date/Time   PHART 7.283 (L) 03/02/2019 0321   PCO2ART 53.7 (H) 03/02/2019 0321   PO2ART 72.0 (L) 03/02/2019 0321  HCO3 25.6 03/02/2019 0321   TCO2 27 03/02/2019 0321   ACIDBASEDEF 2.0 03/02/2019 0321   O2SAT 78.7 03/03/2019 0441    A/P 1. Diaalysis dependent AKI 2/2 ATN, COVID19 2. ARDS/Hypoxic VDRF 3. Systolic HF on dobutamine 4. DM2  Cont current pre/post/dialysate flow rate, Change dialysate to 2K.  UF rate d/w CCM, they will adjust as indicated.  If clotting increases, then will add fixed dose heparin in CRRT circuit.  Pt without any improvement and prognosis is very poor.    Pearson Grippe, MD [facility]

## 2019-03-04 ENCOUNTER — Inpatient Hospital Stay (HOSPITAL_COMMUNITY): Payer: HRSA Program

## 2019-03-04 DIAGNOSIS — N179 Acute kidney failure, unspecified: Secondary | ICD-10-CM

## 2019-03-04 LAB — HEPARIN LEVEL (UNFRACTIONATED)
Heparin Unfractionated: <0.1 IU/mL — ABNORMAL LOW (ref 0.30–0.70)
Heparin Unfractionated: <0.1 IU/mL — ABNORMAL LOW (ref 0.30–0.70)
Heparin Unfractionated: <0.1 IU/mL — ABNORMAL LOW (ref 0.30–0.70)
Heparin Unfractionated: 0.13 IU/mL — ABNORMAL LOW (ref 0.30–0.70)

## 2019-03-04 LAB — RENAL FUNCTION PANEL
Albumin: 2.9 g/dL — ABNORMAL LOW (ref 3.5–5.0)
Albumin: 2.8 g/dL — ABNORMAL LOW (ref 3.5–5.0)
Anion gap: 9 (ref 5–15)
Anion gap: 10 (ref 5–15)
BUN: 32 mg/dL — ABNORMAL HIGH (ref 6–20)
BUN: 39 mg/dL — ABNORMAL HIGH (ref 6–20)
CO2: 28 mmol/L (ref 22–32)
CO2: 24 mmol/L (ref 22–32)
Calcium: 8.7 mg/dL — ABNORMAL LOW (ref 8.9–10.3)
Calcium: 8.4 mg/dL — ABNORMAL LOW (ref 8.9–10.3)
Chloride: 98 mmol/L (ref 98–111)
Chloride: 101 mmol/L (ref 98–111)
Creatinine, Ser: 2.46 mg/dL — ABNORMAL HIGH (ref 0.61–1.24)
Creatinine, Ser: 2.35 mg/dL — ABNORMAL HIGH (ref 0.61–1.24)
GFR calc Af Amer: 35 mL/min — ABNORMAL LOW (ref >60)
GFR calc Af Amer: 37 mL/min — ABNORMAL LOW (ref >60)
GFR calc non Af Amer: 30 mL/min — ABNORMAL LOW (ref >60)
GFR calc non Af Amer: 32 mL/min — ABNORMAL LOW (ref >60)
Glucose, Bld: 178 mg/dL — ABNORMAL HIGH (ref 70–99)
Glucose, Bld: 135 mg/dL — ABNORMAL HIGH (ref 70–99)
Phosphorus: 4.2 mg/dL (ref 2.5–4.6)
Phosphorus: 4.2 mg/dL (ref 2.5–4.6)
Potassium: 4.9 mmol/L (ref 3.5–5.1)
Potassium: 4.4 mmol/L (ref 3.5–5.1)
Sodium: 135 mmol/L (ref 135–145)
Sodium: 135 mmol/L (ref 135–145)

## 2019-03-04 LAB — GLUCOSE, CAPILLARY
Glucose-Capillary: 120 mg/dL — ABNORMAL HIGH (ref 70–99)
Glucose-Capillary: 154 mg/dL — ABNORMAL HIGH (ref 70–99)
Glucose-Capillary: 164 mg/dL — ABNORMAL HIGH (ref 70–99)
Glucose-Capillary: 174 mg/dL — ABNORMAL HIGH (ref 70–99)
Glucose-Capillary: 179 mg/dL — ABNORMAL HIGH (ref 70–99)
Glucose-Capillary: 182 mg/dL — ABNORMAL HIGH (ref 70–99)

## 2019-03-04 LAB — MAGNESIUM: Magnesium: 2.8 mg/dL — ABNORMAL HIGH (ref 1.7–2.4)

## 2019-03-04 MED ORDER — VECURONIUM BROMIDE 10 MG IV SOLR
10.0000 mg | INTRAVENOUS | Status: DC | PRN
  Administered 2019-03-04 – 2019-03-05 (×4): 10 mg via INTRAVENOUS
  Filled 2019-03-04 (×3): qty 10

## 2019-03-04 MED ORDER — VECURONIUM BROMIDE 10 MG IV SOLR
INTRAVENOUS | Status: AC
  Filled 2019-03-04: qty 10

## 2019-03-04 NOTE — Progress Notes (Signed)
proned PR at [employer]

## 2019-03-04 NOTE — Progress Notes (Signed)
Nutrition Follow-up RD working remotely.  DOCUMENTATION CODES:   Not applicable  INTERVENTION:   Per ASPEN and SCCM guidelines, recommend continue gastric feedings during prone positioning, keeping HOB elevated (reverse trendelenburg) to at least 10-25 degrees. Trickle feedings are not required while patient is proned.   Recommend increase Vital AF 1.2 to goal rate of 45 ml/h with Pro-stat 60 ml TID to provide 1896 kcal, 171 gm protein, 876 ml free water daily.  NUTRITION DIAGNOSIS:   Inadequate oral intake related to inability to eat as evidenced by NPO status.  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 Admitted with fever, cough, AMS, and respiratory distress. COVID-19 positive.  Patient is requiring proning every day for 16 hours per day. Noted poor prognosis. Patient remains intubated on ventilator support.  MV: 10.2 L/min Temp (24hrs), Avg:97.6 F (36.4 C), Min:95.3 F (35.2 C), Max:98.6 F (37 C)    Labs reviewed. BUN 32 (H), creatinine 2.46 (H) CBG's: 179-174 Medications reviewed and include Colace, Novolog, Lantus, fentanyl, versed.  CRRT ongoing. I/O -2.5 L since admission  Current TF: Vital AF 1.2 at 20 ml/h with Pro-stat 60 ml TID is providing 1176 kcal and 126 gm protein.  Patient has increased protein needs due to COVID-19 and CRRT, which is not being provided with trickle TF. Estimated protein needs are 2.3-2.5 gm/kg. Current feeding regimen is only providing 1.7 gm protein/kg  Diet Order:   Diet Order            Diet NPO time specified  Diet effective now              EDUCATION NEEDS:   No education needs  have been identified at this time  Skin:  Skin Assessment: Skin Integrity Issues: Skin Integrity Issues:: DTI, Stage II DTI: L and R side of face Stage II: nose  Last BM:  4/29 type 6  Height:   Ht Readings from Last 1 Encounters:  02/24/19 5\' 5"  (1.651 m)    Weight:   Wt Readings from Last 1 Encounters:  03/04/19 73.6 kg    Ideal Body Weight:  61.8 kg  BMI:  Body mass index is 27 kg/m.  Estimated Nutritional Needs:   Kcal:  1820  Protein:  169-184 gm  Fluid:  2.2 L    Molli Barrows, RD, LDN, CNSC Pager [phone] After Hours Pager [phone]

## 2019-03-04 NOTE — Procedures (Signed)
Admit: 02/23/2019 LOS: 61  11M COVID with cardiogenic shock, hypoxic VDRF, AKI on CRRT  Current CRRT Prescription: Start Date: 4/22 Catheter: IJ BFR: 150-200 Pre Blood Pump: 1000 4k DFR: 1500 2K  Replacement Rate: 300 4k Goal UF: 123m/hr net neg Anticoagulation: systemic heparin Clotting: more frequent  S:  Pt not examined, d/w CCM MD   No major clinical changes, continues to require significant FiO2, PEEP  K4.9, phosphorus 2.8  No urine output   O: 04/29 0701 - 04/30 0700 In: 1550.5 [I.V.:720.5; NG/GT:830] Out: 4337 [Emesis/NG output:900]  Filed Weights   03/02/19 0500 03/03/19 0440 03/04/19 0453  Weight: 76.6 kg 75.7 kg 73.6 kg    Recent Labs  Lab 03/03/19 0402 03/03/19 1533 03/04/19 0505  NA 134* 135 135  K 5.4* 5.0 4.9  CL 97* 103 98  CO2 _0 GLUCOSE 169* 142* 178*  BUN 31* 34* 32*  CREATININE 2.57* 2.46* 2.46*  CALCIUM 8.5* 8.3* 8.7*  PHOS 4.1 4.1 4.2   Recent Labs  Lab 02/27/19 0331  02/28/19 0343  03/01/19 0355  03/02/19 0321 03/02/19 0325 03/03/19 0458  WBC 14.3*  --  12.4*  --  12.2*  --   --  13.1* 16.6*  NEUTROABS 11.9*  --  9.3*  --   --   --   --   --  14.3*  HGB 11.6*   < > 12.0*   < > 10.9*   < > 11.6* 11.4* 11.9*  HCT 35.8*   < > 38.7*   < > 34.8*   < > 34.0* 36.9* 38.9*  MCV 90.9  --  93.5  --  94.8  --   --  93.9 95.3  PLT 115*  --  144*  --  179  --   --  204 208   < > = values in this interval not displayed.    Scheduled Meds: . chlorhexidine gluconate (MEDLINE KIT)  15 mL Mouth Rinse BID  . clonazePAM  2 mg Per Tube Q6H  . docusate  100 mg Per Tube BID  . famotidine  20 mg Per Tube Daily  . feeding supplement (PRO-STAT SUGAR FREE 64)  60 mL Per Tube TID  . fentaNYL  2 patch Transdermal Q72H   And  . fentaNYL  1 patch Transdermal Q72H  . insulin aspart  0-15 Units Subcutaneous Q4H  . insulin aspart  6 Units Subcutaneous Q4H  . insulin glargine  8 Units Subcutaneous Daily  . mouth rinse  15 mL Mouth Rinse 10  times per day  . metoprolol tartrate  25 mg Oral BID  . oxyCODONE  10 mg Oral Q4H  . QUEtiapine  50 mg Oral BID  . sennosides  5 mL Oral BID   Continuous Infusions: .  prismasol BGK 4/2.5 1,000 mL/hr at 03/04/19 0615  .  prismasol BGK 4/2.5 300 mL/hr at 03/03/19 2348  . sodium chloride    . sodium chloride 10 mL/hr at 03/04/19 0906  . feeding supplement (VITAL AF 1.2 CAL) 1,000 mL (03/03/19 1347)  . fentaNYL infusion INTRAVENOUS 200 mcg/hr (03/04/19 0900)  . heparin 850 Units/hr (03/04/19 0900)  . midazolam 8 mg/hr (03/04/19 0900)  . prismasol BGK 2/2.5 dialysis solution 1,500 mL/hr at 03/04/19 1029   PRN Meds:.Place/Maintain arterial line **AND** sodium chloride, sodium chloride, acetaminophen, fentaNYL, heparin, labetalol, LORazepam, midazolam, vecuronium  ABG    Component Value Date/Time   PHART 7.283 (L) 03/02/2019 0321   PCO2ART 53.7 (H) 03/02/2019  0321   PO2ART 72.0 (L) 03/02/2019 0321   HCO3 25.6 03/02/2019 0321   TCO2 27 03/02/2019 0321   ACIDBASEDEF 2.0 03/02/2019 0321   O2SAT 78.7 03/03/2019 0441    A/P 1. Diaalysis dependent AKI 2/2 ATN, COVID19 2. ARDS/Hypoxic VDRF 3. Systolic HF on dobutamine 4. DM2 5. Hyperkalemia, improved with change in dialysate to 2K  Cont current pre/post/dialysate flow rate, UF rate d/w CCM, they will adjust as indicated.  If clotting increases, then will add fixed dose heparin in CRRT circuit.     , MD [facility]  

## 2019-03-04 NOTE — Progress Notes (Signed)
Pharmacy is unable to confirm the medications the patient was taking at home. All options have been exhausted and a resolution to the situation is not expected.   Where possible, their outpatient pharmacy(s) have been contacted for the last time prescriptions were filled and that information has been added to each medication in an Order Note (highlighted yellow below the medication).  Please contact pharmacy if further assistance is needed.   Romeo Rabon, PharmD. Mobile: [phone]. 03/04/2019,10:13 AM.

## 2019-03-04 NOTE — Progress Notes (Signed)
Assisted tele visit to patient with wife.  Shuntia Exton R, RN  

## 2019-03-04 NOTE — Progress Notes (Signed)
[facility] Progress Note Patient Name: Jonathan [name] DOB: 08/27/1971 MRN: [id number]   Date of Service  03/04/2019  HPI/Events of Note  Ventricular dyssynchrony despite heavy Versed sedation and Q 6 hours prn Vecuronium  eICU Interventions  PRN Vecuronium interval reduced to Q 3 hours        Frederik Pear 03/04/2019, 12:35 AM

## 2019-03-04 NOTE — Progress Notes (Signed)
Patient was proned with RT and RNs. CXR obtained, will stay for 16hrs. Will un-prone at 1000 03/05/19. Modena Morrow E, RN 03/04/2019 1800

## 2019-03-04 NOTE — Progress Notes (Signed)
[place] per for heparin Indication: COVID+ with elevated d-dimer   Patient Measurements: Height: 5\' 5"  (165.1 cm) Weight: 166 lb 14.2 oz (75.7 kg) IBW/kg (Calculated) : 61.5  Vital Signs: Temp: 98.1 F (36.7 C) (04/29 2310) Temp Source: Axillary (04/29 2310) BP: 127/73 (04/29 2300) Pulse Rate: 133 (04/29 2300)  Labs: Recent Labs    03/01/19 0355  03/02/19 0321 03/02/19 0325 03/02/19 1600  03/03/19 0402 03/03/19 0458 03/03/19 1440 03/03/19 1533 03/03/19 2303  HGB 10.9*   < > 11.6* 11.4*  --   --   --  11.9*  --   --   --   HCT 34.8*   < > 34.0* 36.9*  --   --   --  38.9*  --   --   --   PLT 179  --   --  204  --   --   --  208  --   --   --   HEPARINUNFRC 0.44  --   --  0.48  --    < >  --  <0.10* <0.10*  --  <0.10*  CREATININE 2.80*   < >  --  2.31* 2.37*  --  2.57*  --   --  2.46*  --    < > = values in this interval not displayed.     Assessment: 48 yo M admitted with respiratory distress, cough and AMS found to be COVID+. D-dimers elevated on 4/21 at 11.18.  Patient continues on CRRT which was started 4/22. Pharmacy consulted to dose heparin drip due concerns for increased coagulopathy with elevated d-dimer and COVID-19 diagnosis.   Heparin level undetectable s/p rate change to 750 units/hr, no issues with infusion per RN report.    Goal of Therapy:  Heparin level 0.1 - 0.25 units/mL Monitor platelets by anticoagulation protocol: Yes   Plan:  Increase heparin gtt to 850 units/hr F/u 8 hour heparin level  Bertis Ruddy, PharmD Clinical Pharmacist Please check AMION for all [place] numbers 03/04/2019 1:29 AM

## 2019-03-04 NOTE — Progress Notes (Signed)
Turned PT from prone to supine  Changed PT tube holder

## 2019-03-04 NOTE — Progress Notes (Signed)
ANTICOAGULATION CONSULT NOTE  Pharmacy Consult:  Heparin Indication: COVID+ with elevated d-dimer   Patient Measurements: Height: 5\' 5"  (165.1 cm) Weight: 162 lb 4.1 oz (73.6 kg) IBW/kg (Calculated) : 61.5  Vital Signs: Temp: 98 F (36.7 C) (04/30 2000) Temp Source: Rectal (04/30 2000) BP: 142/94 (04/30 1900) Pulse Rate: 97 (04/30 2018)  Labs: Recent Labs    03/02/19 0321 03/02/19 0325  03/03/19 0458  03/03/19 1533  03/04/19 0505 03/04/19 1131 03/04/19 1600 03/04/19 2015  HGB 11.6* 11.4*  --  11.9*  --   --   --   --   --   --   --   HCT 34.0* 36.9*  --  38.9*  --   --   --   --   --   --   --   PLT  --  204  --  208  --   --   --   --   --   --   --   HEPARINUNFRC  --  0.48   < > <0.10*   < >  --    < > <0.10* <0.10*  --  0.13*  CREATININE  --  2.31*   < >  --   --  2.46*  --  2.46*  --  2.35*  --    < > = values in this interval not displayed.     Assessment: 48 yo M admitted with respiratory distress, cough and AMS.  Found to be COVID+ with elevated d-dimer of 11.18 on 02/23/19. Pharmacy consulted to dose heparin drip due concerns for increased coagulopathy with elevated d-dimer and COVID-19 diagnosis.   Heparin level is therapeutic; no bleeding reported.  Goal of Therapy:  Heparin level 0.1 - 0.25 units/mL Monitor platelets by anticoagulation protocol: Yes   Plan:  Continue heparin gtt at 900 units/hr F/U AM labs  Ezreal Turay D. Mina Marble, PharmD, BCPS, [place] 03/04/2019, 8:43 PM

## 2019-03-04 NOTE — Progress Notes (Signed)
Patient un-proned with RT and RNs at bedside. CCM wants patient to be proned again at 1800. CXR obtained, will continue to monitor closely. Modena Morrow E, RN 03/04/2019 10:58 AM

## 2019-03-04 NOTE — Progress Notes (Signed)
[facility] per for heparin Indication: COVID+ with elevated d-dimer   Patient Measurements: Height: 5\' 5"  (165.1 cm) Weight: 162 lb 4.1 oz (73.6 kg) IBW/kg (Calculated) : 61.5  Vital Signs: Temp: 99.4 F (37.4 C) (04/30 1110) Temp Source: Oral (04/30 1110) BP: 106/68 (04/30 1200) Pulse Rate: 101 (04/30 1200)  Labs: Recent Labs    03/02/19 0321 03/02/19 0325  03/03/19 0402 03/03/19 0458  03/03/19 1533 03/03/19 2303 03/04/19 0505 03/04/19 1131  HGB 11.6* 11.4*  --   --  11.9*  --   --   --   --   --   HCT 34.0* 36.9*  --   --  38.9*  --   --   --   --   --   PLT  --  204  --   --  208  --   --   --   --   --   HEPARINUNFRC  --  0.48   < >  --  <0.10*   < >  --  <0.10* <0.10* <0.10*  CREATININE  --  2.31*   < > 2.57*  --   --  2.46*  --  2.46*  --    < > = values in this interval not displayed.     Assessment: 48 yo M admitted with respiratory distress, cough and AMS found to be COVID+. D-dimers elevated on 4/21 at 11.18.  Patient continues on CRRT which was started 4/22. Pharmacy consulted to dose heparin drip due concerns for increased coagulopathy with elevated d-dimer and COVID-19 diagnosis.   Heparin level undetectable s/p rate change to 850 units/hr, no bleeding noted. Heparin was stopped from 1034 -1104 as patient was unproned. Level was drawn at 1131. Nursing reports some clotting from the CRRT machiene.   Goal of Therapy:  Heparin level 0.1 - 0.25 units/mL Monitor platelets by anticoagulation protocol: Yes   Plan:  Modestly increase heparin gtt to 900 units/hr as could be falsely low due to infusion turned off.  F/u 8 hour heparin level  Isaias Sakai, [place] D PGY1 Pharmacy Resident  Phone [phone] Please use AMION for clinical pharmacists numbers  03/04/2019      12:26 PM

## 2019-03-04 NOTE — Progress Notes (Signed)
NAME:  Jonathan [name], MRN:  [id number], DOB:  12-09-1970, LOS: 9 ADMISSION DATE:  02/23/2019, CONSULTATION DATE:  02/23/2019 REFERRING MD:  Lianne C[facility] ED, CHIEF COMPLAINT:  Respiratory failure   Brief History   48 year old man intubated for severe respiratory failure requiring prone ventilation and nitric oxide for ARDS related to COVID-19.  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  Past Medical History   T2DM recently diagnosed on OHA.   Significant Hospital Events   4/21 admitted and immediately proned on inverse ratio PC ventilation with high PEEP to maintain saturation >85%, Started on NO at 40ppm. 4/22- Tocluzimab X 1 4/21-4/24 Proned daily 4/28 -4/30 proned daily , tapered NO to off  Consults:  PCCM 4/21  Procedures:  4/21 - ETT 7.5 >> 4/21 - RIJ CVC [facility] >>  Significant Diagnostic Tests:  POC U/S 4/21: Bilateral interstitial pattern with bilateral dense consolidation at bases. Echo shows low-normal LVSF. RV moderately dilated with septal shift.   Micro Data:  Blood cultures -PND SARS-CoV2 - POSITIVE resp 4/29 >>     Antimicrobials:  Azithromycin 4/21 >>4/25 Ceftriaxone 4/21 >>4/27 Hydroxychloroquine 4/21 > 4/22  Interim history/subjective:   Remains critically ill, on 70%/PEEP of 18 Proned overnight ReMains on CRRT Had bowel movement yesterday  Objective   Blood pressure 133/84, pulse (!) 118, temperature 98.6 F (37 C), temperature source Oral, resp. rate 19, height 5\' 5"  (1.651 m), weight 73.6 kg, SpO2 92 %. CVP:  [12 mmHg] 12 mmHg  Vent Mode: PRVC FiO2 (%):  [60 %-100 %] 70 % Set Rate:  [30 bmp] 30 bmp Vt Set:  [360 mL] 360 mL PEEP:  [18 cmH20] 18 cmH20 Plateau Pressure:  [11 cmH20-31 cmH20] 11 cmH20   Intake/Output Summary (Last 24 hours) at 03/04/2019 1013 Last data filed at 03/04/2019 0900 Gross per 24 hour  Intake 1666.17 ml   Output 3267 ml  Net -1600.83 ml   Filed Weights   03/02/19 0500 03/03/19 0440 03/04/19 0453  Weight: 76.6 kg 75.7 kg 73.6 kg   Examination: Gen:   Middle-aged Hispanic man sedated no distress HEENT:  ETT and OGT in place. Facial pressure sores under ETT holder  Lungs:    Clear anteriorly.  Crackles at bases have improved.  Asynchrony + CV:         Regular rate and rhythm; no murmurs Abd:      absent bowel sounds; soft, non-tender; no palpable masses, no distension Ext: Mild edema  Skin:      Warm and dry; no rash Neuro: RASS-5 , lid edema  Resolved Hospital Problem list     Assessment & Plan:  48 year old with severe ARDS, multiorgan failure due to COVID  ARDS from COVID-19, requiring mechanical ventilation. 4/28 resumed Prone position again for 16 hours daily, supine for 8 hours-will continue until FiO2 down to 50% DC iNO  Not a candidate for ECMO Vecuronium q 3h prn  for vent asynchrony  Acute cor pulmonale. RV dysfunction noted on bedside echo- related to ARDS Off dobutamine   Diabetes mellitus SSI Lantus 8 units   AKI -On CVVH.  Increase fluid removal 100/hour if Bp tolerates ct full anticoagulation with heparin as he has clotting issues of circuit  Ileus - related to opioid & NMB , high NG output Restarted trickle TFs 4/29 XR abdomen 4/29 reviewed  Ct Colace and senna 1 dose of naloxegol  given 4/29   Acute encephalopathy-goal RA SS -5 while being proned  Versed gtt Continue clonazepam, oxycodone and fentanyl drip to reduce fentanyl gtt usage  Best practice:  Diet: PEPUP protocol now that supine. Pain/Anxiety/Delirium protocol (if indicated): RASS goal -5,Fentanyl, predex, klonopin, Oxycodone,  PRN lorazepam VAP protocol (if indicated): Bundle in place. DVT prophylaxis:Heparin gtt GI prophylaxis: Famotidine Glucose control: Lantus, SSI coverage. Mobility: Bedrest Code Status: Full Family Communication: Family in [place]. Wife/daughter updated over the  phone 4/29 & were able to see him over video with elink Disposition: ICU.    The patient is critically ill with multiple organ systems failure and requires high complexity decision making for assessment and support, frequent evaluation and titration of therapies, application of advanced monitoring technologies and extensive interpretation of multiple databases. Critical Care Time devoted to patient care services described in this note independent of APP/resident  time is 35 minutes.      Kara Mead MD. [name]. [facility] Pager [phone] If no response call [phone]     03/04/2019, 10:13 AM

## 2019-03-05 ENCOUNTER — Telehealth: Payer: Self-pay | Admitting: Internal Medicine

## 2019-03-05 ENCOUNTER — Inpatient Hospital Stay (HOSPITAL_COMMUNITY): Payer: HRSA Program

## 2019-03-05 DIAGNOSIS — J9601 Acute respiratory failure with hypoxia: Secondary | ICD-10-CM

## 2019-03-05 DIAGNOSIS — J988 Other specified respiratory disorders: Secondary | ICD-10-CM

## 2019-03-05 DIAGNOSIS — U071 COVID-19: Secondary | ICD-10-CM

## 2019-03-05 LAB — RENAL FUNCTION PANEL
Albumin: 3.1 g/dL — ABNORMAL LOW (ref 3.5–5.0)
Albumin: 3.2 g/dL — ABNORMAL LOW (ref 3.5–5.0)
Anion gap: 12 (ref 5–15)
Anion gap: 12 (ref 5–15)
BUN: 39 mg/dL — ABNORMAL HIGH (ref 6–20)
BUN: 39 mg/dL — ABNORMAL HIGH (ref 6–20)
CO2: 24 mmol/L (ref 22–32)
CO2: 24 mmol/L (ref 22–32)
Calcium: 9 mg/dL (ref 8.9–10.3)
Calcium: 8.9 mg/dL (ref 8.9–10.3)
Chloride: 98 mmol/L (ref 98–111)
Chloride: 99 mmol/L (ref 98–111)
Creatinine, Ser: 2.33 mg/dL — ABNORMAL HIGH (ref 0.61–1.24)
Creatinine, Ser: 2.38 mg/dL — ABNORMAL HIGH (ref 0.61–1.24)
GFR calc Af Amer: 37 mL/min — ABNORMAL LOW (ref >60)
GFR calc Af Amer: 36 mL/min — ABNORMAL LOW (ref >60)
GFR calc non Af Amer: 32 mL/min — ABNORMAL LOW (ref >60)
GFR calc non Af Amer: 31 mL/min — ABNORMAL LOW (ref >60)
Glucose, Bld: 130 mg/dL — ABNORMAL HIGH (ref 70–99)
Glucose, Bld: 157 mg/dL — ABNORMAL HIGH (ref 70–99)
Phosphorus: 4.2 mg/dL (ref 2.5–4.6)
Phosphorus: 4.1 mg/dL (ref 2.5–4.6)
Potassium: 4.2 mmol/L (ref 3.5–5.1)
Potassium: 4.2 mmol/L (ref 3.5–5.1)
Sodium: 134 mmol/L — ABNORMAL LOW (ref 135–145)
Sodium: 135 mmol/L (ref 135–145)

## 2019-03-05 LAB — CBC
HCT: 38.4 % — ABNORMAL LOW (ref 39.0–52.0)
Hemoglobin: 11.7 g/dL — ABNORMAL LOW (ref 13.0–17.0)
MCH: 29.7 pg (ref 26.0–34.0)
MCHC: 30.5 g/dL (ref 30.0–36.0)
MCV: 97.5 fL (ref 80.0–100.0)
Platelets: 210 10*3/uL (ref 150–400)
RBC: 3.94 MIL/uL — ABNORMAL LOW (ref 4.22–5.81)
RDW: 15.2 % (ref 11.5–15.5)
WBC: 17 10*3/uL — ABNORMAL HIGH (ref 4.0–10.5)
nRBC: 0.6 % — ABNORMAL HIGH (ref 0.0–0.2)

## 2019-03-05 LAB — HEPARIN LEVEL (UNFRACTIONATED): Heparin Unfractionated: 0.1 IU/mL — ABNORMAL LOW (ref 0.30–0.70)

## 2019-03-05 LAB — GLUCOSE, CAPILLARY
Glucose-Capillary: 125 mg/dL — ABNORMAL HIGH (ref 70–99)
Glucose-Capillary: 146 mg/dL — ABNORMAL HIGH (ref 70–99)
Glucose-Capillary: 189 mg/dL — ABNORMAL HIGH (ref 70–99)
Glucose-Capillary: 146 mg/dL — ABNORMAL HIGH (ref 70–99)
Glucose-Capillary: 201 mg/dL — ABNORMAL HIGH (ref 70–99)
Glucose-Capillary: 165 mg/dL — ABNORMAL HIGH (ref 70–99)

## 2019-03-05 LAB — MAGNESIUM: Magnesium: 2.8 mg/dL — ABNORMAL HIGH (ref 1.7–2.4)

## 2019-03-05 MED ORDER — SODIUM CHLORIDE 0.9 % IV SOLN
500.0000 [IU]/h | INTRAVENOUS | Status: DC
  Administered 2019-03-05 – 2019-03-13 (×11): 500 [IU]/h via INTRAVENOUS_CENTRAL
  Filled 2019-03-05 (×7): qty 2
  Filled 2019-03-05: qty 10000
  Filled 2019-03-05 (×7): qty 2

## 2019-03-05 MED ORDER — SODIUM BICARBONATE 8.4 % IV SOLN
INTRAVENOUS | Status: AC
  Filled 2019-03-05: qty 50

## 2019-03-05 MED ORDER — SODIUM CHLORIDE 0.9 % IV BOLUS
500.0000 mL | Freq: Once | INTRAVENOUS | Status: AC
  Administered 2019-03-05: 500 mL via INTRAVENOUS

## 2019-03-05 NOTE — Telephone Encounter (Signed)
error 

## 2019-03-05 NOTE — Progress Notes (Signed)
[facility] per for heparin Indication: COVID+ with elevated d-dimer   Patient Measurements: Height: 5\' 5"  (165.1 cm) Weight: 153 lb (69.4 kg) IBW/kg (Calculated) : 61.5  Vital Signs: Temp: 98.4 F (36.9 C) (05/01 0400) Temp Source: Rectal (05/01 0400) BP: 114/77 (05/01 0700) Pulse Rate: 97 (05/01 0808)  Labs: Recent Labs    03/03/19 0458  03/04/19 0505 03/04/19 1131 03/04/19 1600 03/04/19 2015 03/05/19 0414  HGB 11.9*  --   --   --   --   --   --   HCT 38.9*  --   --   --   --   --   --   PLT 208  --   --   --   --   --   --   HEPARINUNFRC <0.10*   < > <0.10* <0.10*  --  0.13* 0.10*  CREATININE  --    < > 2.46*  --  2.35*  --  2.33*   < > = values in this interval not displayed.     Assessment: 48 yo M admitted with respiratory distress, cough and AMS found to be COVID+. D-dimers elevated on 4/21 at 11.18.  Patient continues on CRRT which was started 4/22. Pharmacy consulted to dose heparin drip due concerns for increased coagulopathy with elevated d-dimer and COVID-19 diagnosis.   Heparin level therapeutic at 0.1 but at bottom of range. However, Nephrology is starting heparin at fixed dose through CRRT. CBC stable - still pending for today. No bleeding or issues with infusion per discussion with RN.  Goal of Therapy:  Heparin level 0.1 - 0.25 units/mL Monitor platelets by anticoagulation protocol: Yes   Plan:  Continue heparin gtt at 900 units/hr for now - watch levels with heparin now running through CRRT at 500 units/hr Monitor daily heparin level and CBC, s/sx bleeding  Elicia Lamp, PharmD, BCPS Please check AMION for all [place] contact numbers Clinical Pharmacist 03/05/2019 8:33 AM

## 2019-03-05 NOTE — Progress Notes (Signed)
Spoke with Judeen Hammans from [facility] about foam dressing that was placed yesterday at 1800 underneath ETT holder. Bilateral foams were placed underneath ETT holder to help lift the pressure that is being applied when patient is proned. Will continue to monitor closely. Modena Morrow E, RN 03/05/2019 8:21 AM

## 2019-03-05 NOTE — Consult Note (Signed)
[place] Nurse wound consult note Patient receiving care in [facility] 2M11.  I spoke directly with the patient's primary RN for today.  Unfortunately, care for the PIs from proning had already been done, the device could not be removed, the patient could not be turned, and I was not able to visualize the areas.  She described the use of foam dressing under the ETT stabilization device.  Perhaps next week a member of the [facility] team will be able to assess the areas. Monitor the wound area(s) for worsening of condition such as: Signs/symptoms of infection,  Increase in size,  Development of or worsening of odor, Development of pain, or increased pain at the affected locations.  Notify the medical team if any of these develop.  Val Riles, RN, MSN, CWOCN, CNS-BC, pager [phone]

## 2019-03-05 NOTE — Progress Notes (Signed)
NAME:  Jonathan [name], MRN:  [id number], DOB:  05-25-1971, LOS: 44 ADMISSION DATE:  02/23/2019, CONSULTATION DATE:  02/23/2019 REFERRING MD:  L[facility] C[facility] ED, CHIEF COMPLAINT:  Respiratory failure   Brief History   48 year old man intubated for severe respiratory failure requiring prone ventilation and nitric oxide for ARDS related to COVID-19.  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  Past Medical History   T2DM recently diagnosed on OHA.   Significant Hospital Events   4/21 admitted and immediately proned on inverse ratio PC ventilation with high PEEP to maintain saturation >85%, Started on NO at 40ppm. 4/22- Tocluzimab X 1 4/21-4/24 Proned daily 4/28 -4/30 proned daily , tapered NO to off  Consults:  PCCM 4/21  Procedures:  4/21 - ETT 7.5 >> 4/21 - RIJ CVC [facility] >>  Significant Diagnostic Tests:  POC U/S 4/21: Bilateral interstitial pattern with bilateral dense consolidation at bases. Echo shows low-normal LVSF. RV moderately dilated with septal shift.   Micro Data:  Blood cultures -PND SARS-CoV2 - POSITIVE resp 4/29 >>rare staph/yeast>>   Antimicrobials:  Azithromycin 4/21 >>4/25 Ceftriaxone 4/21 >>4/27 Hydroxychloroquine 4/21 > 4/22  Interim history/subjective:  Remains on CRRT , no acute issues overnight --4.8 L I/O Balance since admit  Continues to Prone on/off 16 on and 8 off . -CXR stable diffuse bilateral infiltrates (slightly improved aeration ) .  , Vent .80Fio2 .  4/30 -late evening with increased O2 demands .  No paralytics required x 24hr . No vent dysyncrony . Calm on Fent /Versed. Off precedex     Objective   Blood pressure 110/77, pulse (!) 106, temperature 98.5 F (36.9 C), temperature source Oral, resp. rate (!) 30, height 5\' 5"  (1.651 m), weight 69.4 kg, SpO2 95 %.    Vent Mode: PRVC FiO2 (%):  [80 %-100 %] 80 % Set Rate:  [30  bmp] 30 bmp Vt Set:  [360 mL] 360 mL PEEP:  [18 cmH20] 18 cmH20 Plateau Pressure:  [11 cmH20-34 cmH20] 31 cmH20   Intake/Output Summary (Last 24 hours) at 03/05/2019 0945 Last data filed at 03/05/2019 6712 Gross per 24 hour  Intake 1858.6 ml  Output 3940 ml  Net -2081.4 ml   Filed Weights   03/03/19 0440 03/04/19 0453 03/05/19 0423  Weight: 75.7 kg 73.6 kg 69.4 kg   Examination: per RN exam  Gen:   Middle-aged Hispanic man sedated no distress HEENT:  ETT and OGT in place. Facial pressure sores under ETT holder  Lungs:    Coarse BS  On vent  CV:         Regular rate and rhythm; no murmurs Abd:      Hypoactive BS  soft, non-tender; no palpable masses, no distension Ext: Mild 1+ edema -gen  Skin:      Warm and dry; skin abrasion/wound on face along ETT  Neuro: RASS-5 , lid edema  Resolved Hospital Problem list     Assessment & Plan:  48 year old with severe ARDS, multiorgan failure due to COVID-19   ARDS from COVID-19, requiring mechanical ventilation. CXR 5/1 Stable bilateral ASPDZ , slight improved aeration . Requiring high PEEP /FiO2   Plan  Continue to Prone position again for 16 hours daily, supine for 8 hours-will continue until FiO2 down to 50% Not a candidate for ECMO Vecuronium q 3h prn  for vent asynchrony CXR in am   Acute cor  pulmonale. RV dysfunction noted on bedside echo- related to ARDS-off pressors  Consider Echo when stable.    Diabetes mellitus SSI Lantus 8 units   AKI -On CVVH.  ct full anticoagulation with heparin as he has clotting issues of circuit  Plan  Renal following  Cont CVVH   Ileus - related to opioid & NMB  Tolerating TF   XR abdomen 4/29 -nad  1 dose of naloxegol given 4/29- BM 4/29   Plan  Ct Colace and senna 1 dose of naloxegol given 4/29   Acute encephalopathy-goal RAAS -5 while being proned Plan   Versed gtt  Continue clonazepam, oxycodone and fentanyl drip to reduce fentanyl gtt usage  Best practice:  Diet: PEPUP  protocol now that supine. Pain/Anxiety/Delirium protocol (if indicated): RASS goal -5,Fentanyl, Versed  klonopin, Oxycodone,  PRN lorazepam VAP protocol (if indicated): Bundle in place. DVT prophylaxis:Heparin gtt GI prophylaxis: Famotidine Glucose control: Lantus, SSI coverage. Mobility: Bedrest Code Status: Full Family Communication: Family in [place]. Wife/daughter updated over the phone 4/29 & were able to see him over video with elink Disposition: ICU.     Tymel Conely NP-C  [facility]  [phone]  03/05/2019     03/05/2019, 9:45 AM

## 2019-03-05 NOTE — Procedures (Signed)
Admit: 02/23/2019 LOS: 64  85M COVID with cardiogenic shock, hypoxic VDRF, AKI on CRRT  Current CRRT Prescription: Start Date: 4/22 Catheter: IJ BFR: 150-200 Pre Blood Pump: 1000 4k DFR: 1500 2K  Replacement Rate: 300 4k Goal UF: 115m/hr net neg Anticoagulation: systemic heparin Clotting: more frequent about q12h  S:  Pt not examined, d/w CCM MD and RN  No major clinical changes, continues to require significant FiO2, PEEP  K4.9, phosphorus 2.8  No urine output  O: 04/30 0701 - 05/01 0700 In: 1767.6 [P.O.:1; I.V.:741.6; NG/GT:1015] Out: 4042   Filed Weights   03/03/19 0440 03/04/19 0453 03/05/19 0423  Weight: 75.7 kg 73.6 kg 69.4 kg    Recent Labs  Lab 03/04/19 0505 03/04/19 1600 03/05/19 0414  NA 135 135 134*  K 4.9 4.4 4.2  CL 98 101 98  CO2 '28 24 24  ' GLUCOSE 178* 135* 130*  BUN 32* 39* 39*  CREATININE 2.46* 2.35* 2.33*  CALCIUM 8.7* 8.4* 9.0  PHOS 4.2 4.2 4.2   Recent Labs  Lab 02/27/19 0331  02/28/19 0343  03/01/19 0355  03/02/19 0321 03/02/19 0325 03/03/19 0458  WBC 14.3*  --  12.4*  --  12.2*  --   --  13.1* 16.6*  NEUTROABS 11.9*  --  9.3*  --   --   --   --   --  14.3*  HGB 11.6*   < > 12.0*   < > 10.9*   < > 11.6* 11.4* 11.9*  HCT 35.8*   < > 38.7*   < > 34.8*   < > 34.0* 36.9* 38.9*  MCV 90.9  --  93.5  --  94.8  --   --  93.9 95.3  PLT 115*  --  144*  --  179  --   --  204 208   < > = values in this interval not displayed.    Scheduled Meds: . chlorhexidine gluconate (MEDLINE KIT)  15 mL Mouth Rinse BID  . clonazePAM  2 mg Per Tube Q6H  . docusate  100 mg Per Tube BID  . famotidine  20 mg Per Tube Daily  . feeding supplement (PRO-STAT SUGAR FREE 64)  60 mL Per Tube TID  . fentaNYL  2 patch Transdermal Q72H   And  . fentaNYL  1 patch Transdermal Q72H  . insulin aspart  0-15 Units Subcutaneous Q4H  . insulin aspart  6 Units Subcutaneous Q4H  . insulin glargine  8 Units Subcutaneous Daily  . mouth rinse  15 mL Mouth Rinse 10  times per day  . metoprolol tartrate  25 mg Oral BID  . oxyCODONE  10 mg Oral Q4H  . QUEtiapine  50 mg Oral BID  . sennosides  5 mL Oral BID   Continuous Infusions: .  prismasol BGK 4/2.5 1,000 mL/hr at 03/05/19 0657  .  prismasol BGK 4/2.5 300 mL/hr at 03/04/19 1647  . sodium chloride    . sodium chloride 250 mL (03/05/19 0812)  . feeding supplement (VITAL AF 1.2 CAL) 20 mL/hr at 03/04/19 2300  . fentaNYL infusion INTRAVENOUS 200 mcg/hr (03/05/19 0700)  . heparin 900 Units/hr (03/05/19 0700)  . midazolam 8 mg/hr (03/05/19 0700)  . prismasol BGK 2/2.5 dialysis solution 1,500 mL/hr at 03/05/19 0613   PRN Meds:.Place/Maintain arterial line **AND** sodium chloride, sodium chloride, acetaminophen, fentaNYL, heparin, labetalol, LORazepam, midazolam, vecuronium  ABG    Component Value Date/Time   PHART 7.283 (L) 03/02/2019 0321   PCO2ART 53.7 (  H) 03/02/2019 0321   PO2ART 72.0 (L) 03/02/2019 0321   HCO3 25.6 03/02/2019 0321   TCO2 27 03/02/2019 0321   ACIDBASEDEF 2.0 03/02/2019 0321   O2SAT 78.7 03/03/2019 0441    A/P 1. Diaalysis dependent AKI 2/2 ATN, COVID19 2. ARDS/Hypoxic VDRF 3. Systolic HF on dobutamine 4. DM2 5. Hyperkalemia, improved with change in dialysate to 2K  Cont current pre/post/dialysate flow rate, UF rate d/w CCM, they will adjust as indicated.  Add heparin 500u/hr fixed dose in CRRT circuit.     Pearson Grippe, MD [facility]

## 2019-03-05 NOTE — Progress Notes (Signed)
Proned patient with RT and RNs, CXR obtained. Will continue to monitor closely. Bobby Rumpf, Barnes Florek E 03/05/2019 6:10 PM

## 2019-03-05 NOTE — Progress Notes (Signed)
Patient hypotensive 81/44 MAP 54. E Link notified 500cc bolus ordered. Nephrology notified and was told to keep even on CRRT over night. Will continue to monitor.

## 2019-03-05 NOTE — TOC Progression Note (Signed)
Transition of Care [facility]) - Progression Note    Patient Details  Name: Jonathan [name] MRN: [id number] Date of Birth: 05/08/1971  [facility] CM/SW Contact  Maryclare Labrador, RN Phone Number: 03/05/2019, 10:52 AM  Clinical Narrative:   Pt remains intubated and on CRRT.    Expected Discharge Plan: (CM unsure at this time of dispostion as pt remains critically ill) Barriers to Discharge: Continued Medical Work up(COVID positive on CRRT)  Expected Discharge Plan and Services Expected Discharge Plan: (CM unsure at this time of dispostion as pt remains critically ill)       Living arrangements for the past 2 months: Single Family Home                                       Social Determinants of Health (SDOH) Interventions    Readmission Risk Interventions No flowsheet data found.

## 2019-03-06 ENCOUNTER — Inpatient Hospital Stay (HOSPITAL_COMMUNITY): Payer: HRSA Program

## 2019-03-06 LAB — POCT I-STAT 7, (LYTES, BLD GAS, ICA,H+H)
Acid-base deficit: 2 mmol/L (ref 0.0–2.0)
Bicarbonate: 25.8 mmol/L (ref 20.0–28.0)
Calcium, Ion: 1.23 mmol/L (ref 1.15–1.40)
HCT: 35 % — ABNORMAL LOW (ref 39.0–52.0)
Hemoglobin: 11.9 g/dL — ABNORMAL LOW (ref 13.0–17.0)
O2 Saturation: 95 %
Patient temperature: 98.2
Potassium: 4.4 mmol/L (ref 3.5–5.1)
Sodium: 133 mmol/L — ABNORMAL LOW (ref 135–145)
TCO2: 27 mmol/L (ref 22–32)
pCO2 arterial: 56.5 mmHg — ABNORMAL HIGH (ref 32.0–48.0)
pH, Arterial: 7.266 — ABNORMAL LOW (ref 7.350–7.450)
pO2, Arterial: 86 mmHg (ref 83.0–108.0)

## 2019-03-06 LAB — CBC
HCT: 36 % — ABNORMAL LOW (ref 39.0–52.0)
Hemoglobin: 11.1 g/dL — ABNORMAL LOW (ref 13.0–17.0)
MCH: 30.1 pg (ref 26.0–34.0)
MCHC: 30.8 g/dL (ref 30.0–36.0)
MCV: 97.6 fL (ref 80.0–100.0)
Platelets: 225 10*3/uL (ref 150–400)
RBC: 3.69 MIL/uL — ABNORMAL LOW (ref 4.22–5.81)
RDW: 15.6 % — ABNORMAL HIGH (ref 11.5–15.5)
WBC: 16.9 10*3/uL — ABNORMAL HIGH (ref 4.0–10.5)
nRBC: 0.8 % — ABNORMAL HIGH (ref 0.0–0.2)

## 2019-03-06 LAB — RENAL FUNCTION PANEL
Albumin: 3 g/dL — ABNORMAL LOW (ref 3.5–5.0)
Albumin: 2.9 g/dL — ABNORMAL LOW (ref 3.5–5.0)
Anion gap: 13 (ref 5–15)
Anion gap: 11 (ref 5–15)
BUN: 43 mg/dL — ABNORMAL HIGH (ref 6–20)
BUN: 45 mg/dL — ABNORMAL HIGH (ref 6–20)
CO2: 20 mmol/L — ABNORMAL LOW (ref 22–32)
CO2: 23 mmol/L (ref 22–32)
Calcium: 8.7 mg/dL — ABNORMAL LOW (ref 8.9–10.3)
Calcium: 8.7 mg/dL — ABNORMAL LOW (ref 8.9–10.3)
Chloride: 102 mmol/L (ref 98–111)
Chloride: 100 mmol/L (ref 98–111)
Creatinine, Ser: 2.48 mg/dL — ABNORMAL HIGH (ref 0.61–1.24)
Creatinine, Ser: 2.32 mg/dL — ABNORMAL HIGH (ref 0.61–1.24)
GFR calc Af Amer: 34 mL/min — ABNORMAL LOW (ref >60)
GFR calc Af Amer: 37 mL/min — ABNORMAL LOW (ref >60)
GFR calc non Af Amer: 30 mL/min — ABNORMAL LOW (ref >60)
GFR calc non Af Amer: 32 mL/min — ABNORMAL LOW (ref >60)
Glucose, Bld: 192 mg/dL — ABNORMAL HIGH (ref 70–99)
Glucose, Bld: 163 mg/dL — ABNORMAL HIGH (ref 70–99)
Phosphorus: 4 mg/dL (ref 2.5–4.6)
Phosphorus: 3.7 mg/dL (ref 2.5–4.6)
Potassium: 4.4 mmol/L (ref 3.5–5.1)
Potassium: 3.9 mmol/L (ref 3.5–5.1)
Sodium: 135 mmol/L (ref 135–145)
Sodium: 134 mmol/L — ABNORMAL LOW (ref 135–145)

## 2019-03-06 LAB — CULTURE, RESPIRATORY W GRAM STAIN

## 2019-03-06 LAB — GLUCOSE, CAPILLARY
Glucose-Capillary: 130 mg/dL — ABNORMAL HIGH (ref 70–99)
Glucose-Capillary: 154 mg/dL — ABNORMAL HIGH (ref 70–99)
Glucose-Capillary: 176 mg/dL — ABNORMAL HIGH (ref 70–99)
Glucose-Capillary: 177 mg/dL — ABNORMAL HIGH (ref 70–99)
Glucose-Capillary: 184 mg/dL — ABNORMAL HIGH (ref 70–99)
Glucose-Capillary: 197 mg/dL — ABNORMAL HIGH (ref 70–99)

## 2019-03-06 LAB — D-DIMER, QUANTITATIVE: D-Dimer, Quant: 13.1 ug/mL-FEU — ABNORMAL HIGH (ref 0.00–0.50)

## 2019-03-06 LAB — TROPONIN I: Troponin I: 0.04 ng/mL (ref <0.03)

## 2019-03-06 LAB — MAGNESIUM: Magnesium: 2.7 mg/dL — ABNORMAL HIGH (ref 1.7–2.4)

## 2019-03-06 LAB — HEPARIN LEVEL (UNFRACTIONATED): Heparin Unfractionated: 0.2 IU/mL — ABNORMAL LOW (ref 0.30–0.70)

## 2019-03-06 LAB — FERRITIN: Ferritin: 696 ng/mL — ABNORMAL HIGH (ref 24–336)

## 2019-03-06 LAB — C-REACTIVE PROTEIN: CRP: 4.6 mg/dL — ABNORMAL HIGH (ref <1.0)

## 2019-03-06 MED ORDER — SODIUM CHLORIDE 0.9 % IV SOLN
0.0000 ug/kg/min | INTRAVENOUS | Status: DC
  Administered 2019-03-06: 18:00:00 3 ug/kg/min via INTRAVENOUS
  Administered 2019-03-07: 2.5 ug/kg/min via INTRAVENOUS
  Administered 2019-03-08: 3 ug/kg/min via INTRAVENOUS
  Administered 2019-03-08: 4 ug/kg/min via INTRAVENOUS
  Administered 2019-03-09: 5 ug/kg/min via INTRAVENOUS
  Filled 2019-03-06 (×6): qty 20

## 2019-03-06 MED ORDER — SODIUM CHLORIDE 0.9 % IV SOLN
0.5000 mg/h | INTRAVENOUS | Status: DC
  Administered 2019-03-06 – 2019-03-10 (×3): 4 mg/h via INTRAVENOUS
  Administered 2019-03-10: 1 mg/h via INTRAVENOUS
  Filled 2019-03-06 (×3): qty 20

## 2019-03-06 MED ORDER — HYDROMORPHONE BOLUS VIA INFUSION
0.5000 mg | INTRAVENOUS | Status: DC | PRN
  Administered 2019-03-06 – 2019-03-11 (×4): 0.5 mg via INTRAVENOUS
  Filled 2019-03-06: qty 1

## 2019-03-06 MED ORDER — FENTANYL CITRATE (PF) 2500 MCG/50ML IJ SOLN
50.0000 ug/h | Status: AC
  Filled 2019-03-06: qty 100

## 2019-03-06 MED ORDER — CISATRACURIUM BOLUS VIA INFUSION
0.1000 mg/kg | Freq: Once | INTRAVENOUS | Status: AC
  Administered 2019-03-06: 7.2 mg via INTRAVENOUS
  Filled 2019-03-06: qty 8

## 2019-03-06 MED ORDER — SODIUM CHLORIDE 0.9 % IV SOLN
2.0000 g | INTRAVENOUS | Status: AC
  Administered 2019-03-06 – 2019-03-12 (×7): 2 g via INTRAVENOUS
  Filled 2019-03-06 (×7): qty 20

## 2019-03-06 MED ORDER — ARTIFICIAL TEARS OPHTHALMIC OINT
1.0000 "application " | TOPICAL_OINTMENT | Freq: Three times a day (TID) | OPHTHALMIC | Status: DC
  Administered 2019-03-06 – 2019-03-10 (×12): 1 via OPHTHALMIC
  Filled 2019-03-06: qty 3.5

## 2019-03-06 MED ORDER — PRISMASOL BGK 4/2.5 32-4-2.5 MEQ/L IV SOLN
INTRAVENOUS | Status: DC
  Administered 2019-03-06 – 2019-03-14 (×43): via INTRAVENOUS_CENTRAL
  Filled 2019-03-06 (×36): qty 5000

## 2019-03-06 NOTE — Progress Notes (Signed)
CRITICAL VALUE ALERT  Critical Value:  Troponin 0.04  Date & Time Notied:  8457  03/06/2019  Provider Notified: Warren Lacy  Orders Received/Actions taken:

## 2019-03-06 NOTE — Progress Notes (Signed)
[place] per for heparin Indication: COVID+ with elevated d-dimer   Patient Measurements: Height: 5\' 5"  (165.1 cm) Weight: 158 lb 1.1 oz (71.7 kg) IBW/kg (Calculated) : 61.5  Vital Signs: Temp: 98.2 F (36.8 C) (05/02 0800) Temp Source: Oral (05/02 0800) BP: 106/71 (05/02 0800) Pulse Rate: 91 (05/02 0800)  Labs: Recent Labs    03/04/19 1600 03/04/19 2015 03/05/19 0414  03/05/19 0900 03/05/19 1635 03/06/19 0112 03/06/19 0301  HGB  --   --   --    < > 11.7*  --  11.9* 11.1*  HCT  --   --   --   --  38.4*  --  35.0* 36.0*  PLT  --   --   --   --  210  --   --  225  HEPARINUNFRC  --  0.13* 0.10*  --   --   --   --  0.20*  CREATININE 2.35*  --  2.33*  --   --  2.38*  --   --   TROPONINI  --   --   --   --   --   --   --  0.04*   < > = values in this interval not displayed.     Assessment: 48 yo M admitted with respiratory distress, cough and AMS found to be COVID+. D-dimers elevated on 4/21 at 11.18, remains elevated this am 13.1 Patient continues on CRRT which was started 4/22. heparin drip due concerns for increased coagulopathy with elevated d-dimer and COVID-19 diagnosis.   Continues w fixed dose heparin through circuit Hep lvl this am 0.2 H&H 11.1/36, Plt 225  Goal of Therapy:  Heparin level 0.1 - 0.25 units/mL Monitor platelets by anticoagulation protocol: Yes   Plan:  Continue heparin gtt at 900 units/hr for now Monitor daily heparin level and CBC, s/sx bleeding  Levester Fresh, PharmD, BCPS, BCCCP Clinical Pharmacist [phone]  Please check AMION for all [facility] numbers  03/06/2019 8:09 AM

## 2019-03-06 NOTE — Progress Notes (Signed)
Proned at 9323 without complication. Plan to unprone in 16 hours at 1000 03/07/19.

## 2019-03-06 NOTE — Progress Notes (Addendum)
PI note for EAP Mayo Plasma Protocol  Spoke to daughter Chrissie Noa over phone via interpreter Verdis Frederickson ID [phone] [employer]. Explained the [facility] EAP protocl for convalescent plasma as part of pre-consent process. She wants to be able to read the print version in Spanish first and talk to patient wife/her mom and then get back 03/07/19. So, t his was emailed to her 03/06/2019 . Will followup with her 03/07/19 . They are in [place]    SIGNATURE    Dr. Brand Males, M.D., F.C.C.P, ACRP-CPI Pulmonary and Critical Care Medicine Research Investigator, [facility] [place] Staff Physician, [facility] Director - Interstitial Lung Disease  Program  Pulmonary [facility] [place], [place], [address]  Pager: [phone], If no answer or between  15:00h - 7:00h: call [phone] Telephone: [phone]  4:12 PM 03/06/2019

## 2019-03-06 NOTE — Progress Notes (Signed)
NAME:  Jonathan [name], MRN:  [id number], DOB:  Mar 27, 1971, LOS: 83 ADMISSION DATE:  02/23/2019, CONSULTATION DATE:  02/23/2019 REFERRING MD:  Lianne [facility] ED, CHIEF COMPLAINT:  Respiratory failure   Brief History   48 year old man intubated for severe respiratory failure requiring prone ventilation and nitric oxide for ARDS related to COVID-19. 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  Past Medical History   T2DM recently diagnosed on OHA.   Significant Hospital Events   4/21 admitted and immediately proned on inverse ratio PC ventilation with high PEEP to maintain saturation >85%, Started on NO at 40ppm. 4/22- Tocluzimab X 1 4/21-4/24 Proned daily 4/28 -4/30 proned daily , tapered NO to off  4/30 -late evening with increased O2 demands . No paralytics required x 24hr . No vent dysyncrony . Calm on Fent /Versed. Off precedex   5/1 - Remains on CRRT , no acute issues overnight --4.8 L I/O Balance since admit . Continues to Prone on/off 16 on and 8 off . -CXR stable diffuse bilateral infiltrates (slightly improved aeration ) . , Vent .80Fio2 .    Consults:  PCCM 4/21  Procedures:  4/21 - ETT 7.5 >> 4/21 - RIJ CVC [facility] >>  Significant Diagnostic Tests:  POC U/S 4/21: Bilateral interstitial pattern with bilateral dense consolidation at bases. Echo shows low-normal LVSF. RV moderately dilated with septal shift.   Micro Data:  Blood cultures -PND SARS-CoV2 02/23/2019 - - POSITIVE HIV 4/21 -neg Quant gold 4/2 1- neg .....................Marland Kitchen resp 4/29 >>rare MSSA and yeast   Anti-covid RX and antimicrobial RX  Azithromycin 4/21 >>4/25 Ceftriaxone 4/21 >>4/27 Actemra 4/22 Hydroxychloroquine 4/21 > 4/22 ........................... Ceftriaxone (MSSA) 4/22 >>  Interim history/subjective:   03/06/2019 - Biomarkes improving but severe ARDS persists ad D-dimer still very  high without improvement. On IV Heparin gtt. Growing MSSA. SEdation changed to dilaudid gtt. Continues prone 16h and 8h syupine. Currently supine at 80% fio2, peep 18. On TF   Results for Jonathan [name], Jonathan [name] (MRN [id number]) as of 03/06/2019 14:37  Ref. Range 02/25/2019 04:56 02/26/2019 05:45 02/27/2019 03:31 02/28/2019 03:43 03/06/2019 03:01  Ferritin Latest Ref Range: 24 - 336 ng/mL 3,564 (H) 1,783 (H) 1,227 (H) 1,225 (H) 696 (H)   Results for Jonathan [name], Jonathan [name] (MRN [id number]) as of 03/06/2019 14:37  Ref. Range 02/24/2019 00:54 02/25/2019 04:56 02/26/2019 05:45 02/27/2019 03:31 03/06/2019 03:01  CRP Latest Ref Range: <1.0 mg/dL 24.8 (H) 35.1 (H) 16.0 (H) 8.4 (H) 4.6 (H)   Results for Jonathan [name], Jonathan [name] (MRN [id number]) as of 03/06/2019 14:37  Ref. Range 02/23/2019 09:46 03/06/2019 03:01  D-Dimer, Quant Latest Ref Range: 0.00 - 0.50 ug/mL-FEU 11.18 (H) 13.10 (H)   Objective   Blood pressure (!) 94/59, pulse 86, temperature 98.3 F (36.8 C), temperature source Oral, resp. rate (!) 27, height 5\' 5"  (1.651 m), weight 71.7 kg, SpO2 99 %.    Vent Mode: PRVC FiO2 (%):  [50 %-100 %] 80 % Set Rate:  [30 bmp] 30 bmp Vt Set:  [360 mL] 360 mL PEEP:  [18 cmH20] 18 cmH20 Pressure Support:  [18 cmH20] 18 cmH20 Plateau Pressure:  [31 cmH20-32 cmH20] 32 cmH20   Intake/Output Summary (Last 24 hours) at 03/06/2019 1430 Last data filed at 03/06/2019 1400 Gross per 24 hour  Intake 2273.47 ml  Output 2608 ml  Net -334.53 ml   Filed Weights   03/04/19 0453 03/05/19  0423 03/06/19 0235  Weight: 73.6 kg 69.4 kg 71.7 kg   Due to Pandemic situation - this exam is an inspection exam from outside or via camera and with tactile part being shared findings with other care providers who have examined in last 60 minutes   General Appearance:  Looks criticall ill HEENT: Appears intact Throat:  ETT TUBE - yes , OG tube - yes. Has some facial redness Neck:  Supple,  No enlargement/tenderness/nodules Lungs: Clear to  auscultation bilaterally, Ventilator   Synchrony -yes, 80%f fio2, peep 18 Heart:  S1 and S2 normal, no murmur, CVP - no  Pressors - lno Abdomen:  Not examined Genitalia / Rectal:  Not examined Extremities:  Extremities- intct Skin:  Intact in exposed areas . Sacral area - not examined Neurologic:  Sedation - dilaudid gtt and versed gtt -> RASS - -4   LABS    PULMONARY Recent Labs  Lab 03/01/19 0135 03/01/19 0341  03/01/19 1051 03/02/19 0321 03/02/19 0335 03/03/19 0441 03/06/19 0112  PHART 7.249* 7.259*  --  7.342* 7.283*  --   --  7.266*  PCO2ART 60.3* 57.7*  --  47.7 53.7*  --   --  56.5*  PO2ART 68.0* 99.0  --  263.0* 72.0*  --   --  86.0  HCO3 26.5 25.9  --  26.1 25.6  --   --  25.8  TCO2 28 28  --  28 27  --   --  27  O2SAT 90.0 96.0   < > 100.0 92.0 83.8 78.7 95.0   < > = values in this interval not displayed.    CBC Recent Labs  Lab 03/03/19 0458 03/05/19 0900 03/06/19 0112 03/06/19 0301  HGB 11.9* 11.7* 11.9* 11.1*  HCT 38.9* 38.4* 35.0* 36.0*  WBC 16.6* 17.0*  --  16.9*  PLT 208 210  --  225    COAGULATION No results for input(s): INR in the last 168 hours.  CARDIAC   Recent Labs  Lab 03/06/19 0301  TROPONINI 0.04*   No results for input(s): PROBNP in the last 168 hours.   CHEMISTRY Recent Labs  Lab 03/02/19 0325  03/03/19 0358  03/04/19 0505 03/04/19 1600 03/05/19 0414 03/05/19 1635 03/06/19 0112 03/06/19 0301  NA 135   < >  --    < > 135 135 134* 135 133* 135  K 5.1   < >  --    < > 4.9 4.4 4.2 4.2 4.4 4.4  CL 102   < >  --    < > 98 101 98 99  --  102  CO2 24   < >  --    < > 28 24 24 24   --  20*  GLUCOSE 211*   < >  --    < > 178* 135* 130* 157*  --  192*  BUN 31*   < >  --    < > 32* 39* 39* 39*  --  43*  CREATININE 2.31*   < >  --    < > 2.46* 2.35* 2.33* 2.38*  --  2.48*  CALCIUM 8.2*   < >  --    < > 8.7* 8.4* 9.0 8.9  --  8.7*  MG 2.7*  --  2.6*  --  2.8*  --  2.8*  --   --  2.7*  PHOS 4.1   < >  --    < > 4.2 4.2 4.2 4.1   --  4.0   < > = values in this interval not displayed.   Estimated Creatinine Clearance: 31.7 mL/min (A) (by C-G formula based on SCr of 2.48 mg/dL (H)).   LIVER Recent Labs  Lab 02/28/19 0343  03/04/19 0505 03/04/19 1600 03/05/19 0414 03/05/19 1635 03/06/19 0301  AST 89*  --   --   --   --   --   --   ALT 94*  --   --   --   --   --   --   ALKPHOS 98  --   --   --   --   --   --   BILITOT 0.9  --   --   --   --   --   --   PROT 5.7*  --   --   --   --   --   --   ALBUMIN 2.2*   < > 2.9* 2.8* 3.1* 3.2* 3.0*   < > = values in this interval not displayed.     INFECTIOUS No results for input(s): LATICACIDVEN, PROCALCITON in the last 168 hours.   ENDOCRINE CBG (last 3)  Recent Labs    03/06/19 0345 03/06/19 0737 03/06/19 1103  GLUCAP 184* 197* 177*         IMAGING x48h  - image(s) personally visualized  -   highlighted in bold Dg Chest Port 1 View  Result Date: 03/06/2019 CLINICAL DATA:  Respiratory failure EXAM: PORTABLE CHEST 1 VIEW COMPARISON:  Yesterday FINDINGS: Endotracheal tube tip at the clavicular heads. The orogastric tube at least reaches the stomach. Right IJ line with tip near the SVC origin. Bilateral mainly peripheral airspace disease. Normal heart size. No effusion or pneumothorax. IMPRESSION: Stable hardware positioning and bilateral airspace disease. Electronically Signed   By: Monte Fantasia M.D.   On: 03/06/2019 07:05   Dg Chest Port 1 View  Result Date: 03/05/2019 CLINICAL DATA:  Intubated EXAM: PORTABLE CHEST 1 VIEW COMPARISON:  Chest radiograph from earlier today. FINDINGS: Endotracheal tube tip is 5.8 cm above the carina. Enteric tube enters stomach with the tip not seen on this image. Right internal jugular central venous catheter terminates in the upper third of the SVC. Left internal jugular central venous catheter terminates in the upper third of the SVC. Stable cardiomediastinal silhouette with normal heart size. No pneumothorax. No pleural  effusion. Severe patchy opacities throughout both lungs, most prominent in peripheral left lung, not substantially changed. IMPRESSION: 1. Well-positioned support structures as detailed. 2. No substantial change in severe patchy bilateral lung opacities most prominent in the peripheral left lung, compatible with known COVID-19 pneumonia. Electronically Signed   By: Ilona Sorrel M.D.   On: 03/05/2019 18:43   Dg Chest Port 1 View  Result Date: 03/05/2019 CLINICAL DATA:  Ventilator support.  Follow-up. EXAM: PORTABLE CHEST 1 VIEW COMPARISON:  Earlier same day FINDINGS: Endotracheal tube tip is 5 cm above the carina. Nasogastric or orogastric tube enters the abdomen. Dual lumen left internal jugular central line tip in the SVC at the azygos level. Single-lumen right internal jugular catheter in the SVC at the azygos level. Widespread patchy bilateral pulmonary infiltrates consistent with pneumonia persist. No worsening or new finding. IMPRESSION: No change since earlier today. Lines and tubes well positioned. Widespread patchy bilateral pulmonary infiltrates. Electronically Signed   By: Nelson Chimes M.D.   On: 03/05/2019 11:00   Dg Chest Port 1 View  Result Date: 03/05/2019 CLINICAL DATA:  Acute respiratory  failure. EXAM: PORTABLE CHEST 1 VIEW COMPARISON:  Radiograph of March 04, 2019. FINDINGS: The heart size and mediastinal contours are within normal limits. Endotracheal and nasogastric tubes are unchanged in position. Bilateral jugular catheters are unchanged in position. No pneumothorax or significant pleural effusion is noted. Stable bilateral diffuse but predominantly peripheral airspace opacities are noted in both lungs. The visualized skeletal structures are unremarkable. IMPRESSION: Stable support apparatus. Stable diffuse bilateral lung opacities as described above. Electronically Signed   By: Marijo Conception M.D.   On: 03/05/2019 07:21   Dg Chest Port 1 View  Result Date: 03/04/2019 CLINICAL DATA:   Ventilator dependence.  COVID-19 positive. EXAM: PORTABLE CHEST 1 VIEW COMPARISON:  Earlier same day FINDINGS: 1805 hours. Prone positioning. Endotracheal tube tip is 4.6 cm above the base of the carina. Right IJ central line tip overlies the innominate vein confluence. The left IJ central line tip also overlies the innominate vein confluence. The NG tube passes into the stomach although the distal tip position is not included on the film. The cardiopericardial silhouette is within normal limits for size. The peripheral bilateral airspace disease is generally similar to prior although there may be some increase confluence in the periphery of the right upper and mid lung period The visualized bony structures of the thorax are intact. Telemetry leads overlie the chest. Prominent gastric bubble noted. IMPRESSION: 1. Overall similar appearance of the diffuse peripheral airspace consolidation with some probable mild progression peripherally in the right upper and mid lung. No pleural effusions. 2. Prominent gastric bubble despite the presence of an NG tube. 3. Remaining support apparatus stable. Electronically Signed   By: Misty Stanley M.D.   On: 03/04/2019 18:58     Resolved Hospital Problem list     Assessment & Plan:  48 year old with severe ARDS, multiorgan failure due to COVID-19    ASSESSMENT / PLAN:  PULMONARY A:  Severe aRDS due to COVID-19. On prone.supine cycles.  High d-dimer  03/06/2019 -> fio2 80%, peep 18  P:   contniue prone 16h/supine 8h till fio2 60% and pep 10 Iv heparin for crrrt and high d-dimer Not a candidate for ECMO Initiate nimbex given severe ards persistence  X 48h since 03/06/2019   NEUROLOGIC A:   Deep sedation due to aRDS P:   Change fentanyl gtt to dilaudid gtt Versed gtt Vec prn    VASCULAR A:   Maintains bp/hr  P:  MAP Goal > 65  CARDIAC A: Trop 0.04  P: monitor   INFECTIOUS A:   COVID-19 at admit 02/23/2019   03/06/2019 - MSSA pna   P:    Start ceftriaxone Convaslescent plasma on MAyp EAP protocol - meets I/e criteria  - he needs LAR consent but they are spanish and in [place] . Will get emergency consent per FDA guidance from 2nd physician but will discuss verbally with family if we can get hold of them via interpreeter  RENAL A:  ATN- anuric. On CRRT P:  Per renal - on CRRT (bad prognostic sign)  ELECTROLYTES A:  Acceptable lyte profile P: Monitor closely   GASTROINTESTINAL A:   On TF  P:   TF ppi  HEMATOLOGIC A:  Anemia critical illnes   P:  - PRBC for hgb </= 6.9gm%    - exceptions are   -  if ACS susepcted/confirmed then transfuse for hgb </= 8.0gm%,  or    -  active bleeding with hemodynamic instability, then transfuse regardless of hemoglobin value  At at all times try to transfuse 1 unit prbc as possible with exception of active hemorrhage     ENDOCRINE A:   At risk hyperglycemia   P:   ssi lantus  MSK/DERM Facial pressure sores  Plan Skin protection   Best practice:  Diet: PEPUP protocol now that supine. Pain/Anxiety/Delirium protocol (if indicated): RASS goal -5,Fentanyl, Versed  klonopin, Oxycodone,  PRN lorazepam VAP protocol (if indicated): Bundle in place. DVT prophylaxis:Heparin gtt GI prophylaxis: Famotidine Glucose control: Lantus, SSI coverage. Mobility: Bedrest Code Status: Full Family Communication: Family in [place]. Wife/daughter updated over the phone 4/29 & were able to see him over video with elink. Trying to get hold of family 03/06/2019  Disposition: ICU.       ATTESTATION & SIGNATURE   The patient Rowyn Spilde is critically ill with multiple organ systems failure and requires high complexity decision making for assessment and support, frequent evaluation and titration of therapies, application of advanced monitoring technologies and extensive interpretation of multiple databases.   Critical Care Time devoted to patient care services  described in this note is  30  Minutes. This time reflects time of care of this signee Dr Brand Males. This critical care time does not reflect procedure time, or teaching time or supervisory time of PA/NP/Med student/Med Resident etc but could involve care discussion time     Dr. Brand Males, M.D., [facility].C.P Pulmonary and Critical Care Medicine Staff Physician [facility] Pager: [phone], If no answer or between  15:00h - 7:00h: call [phone]  03/06/2019 2:41 PM

## 2019-03-06 NOTE — Progress Notes (Signed)
2nd Physician Emergency Consent Documentation for Convalescent plasma, EAP Mayo FDA protocol  Reviewed case in detail with Dr. Chase Caller, investigator.  Pt meets criteria for convalescent plasma EAP protocol.  Dr. Chase Caller unable to contact family who reside in [place], plus language barrier.  Therefore I support ordering convalescent plasma in this patient with COVID 19 ARDS.  Chesley Mires, MD [facility] Pulmonary/Critical Care 03/06/2019, 3:27 PM

## 2019-03-06 NOTE — Progress Notes (Signed)
RT note: patient does not meet daily SBT requirements per policy for FIO2, PEEP, and hemodynamic instability.  Tolerating current ventilator settings well at this time.  Will continue to monitor.

## 2019-03-06 NOTE — Procedures (Signed)
Admit: 02/23/2019 LOS: 21  61M COVID with cardiogenic shock, hypoxic VDRF, AKI on CRRT  Current CRRT Prescription: Start Date: 4/22 Catheter: IJ BFR: 150-200 Pre Blood Pump: 1000 4k DFR: 1500 2K  Replacement Rate: 300 4k Goal UF: 152m/hr net neg Anticoagulation: systemic heparin + 500 u/hr in CRRT circuit Clotting: improved  S:  Hypotension overnight, rec bolus IVFs and UF rate change to net even  Pt not examined, d/w CCM MD and RN  K 4.4, P 2.7  No UOP   O: 05/01 0701 - 05/02 0700 In: 2243 [I.V.:743; NG/GT:900; IV Piggyback:500] Out: 2888   [facility]   03/04/19 0453 03/05/19 0423 03/06/19 0235  Weight: 73.6 kg 69.4 kg 71.7 kg    Recent Labs  Lab 03/05/19 0414 03/05/19 1635 03/06/19 0112 03/06/19 0301  NA 134* 135 133* 135  K 4.2 4.2 4.4 4.4  CL 98 99  --  102  CO2 24 24  --  20*  GLUCOSE 130* 157*  --  192*  BUN 39* 39*  --  43*  CREATININE 2.33* 2.38*  --  2.48*  CALCIUM 9.0 8.9  --  8.7*  PHOS 4.2 4.1  --  4.0   Recent Labs  Lab 02/28/19 0343  03/03/19 0458 03/05/19 0900 03/06/19 0112 03/06/19 0301  WBC 12.4*   < > 16.6* 17.0*  --  16.9*  NEUTROABS 9.3*  --  14.3*  --   --   --   HGB 12.0*   < > 11.9* 11.7* 11.9* 11.1*  HCT 38.7*   < > 38.9* 38.4* 35.0* 36.0*  MCV 93.5   < > 95.3 97.5  --  97.6  PLT 144*   < > 208 210  --  225   < > = values in this interval not displayed.    Scheduled Meds: . chlorhexidine gluconate (MEDLINE KIT)  15 mL Mouth Rinse BID  . clonazePAM  2 mg Per Tube Q6H  . docusate  100 mg Per Tube BID  . famotidine  20 mg Per Tube Daily  . feeding supplement (PRO-STAT SUGAR FREE 64)  60 mL Per Tube TID  . fentaNYL  2 patch Transdermal Q72H  . insulin aspart  0-15 Units Subcutaneous Q4H  . insulin aspart  6 Units Subcutaneous Q4H  . insulin glargine  8 Units Subcutaneous Daily  . mouth rinse  15 mL Mouth Rinse 10 times per day  . metoprolol tartrate  25 mg Oral BID  . oxyCODONE  10 mg Oral Q4H  . QUEtiapine  50  mg Oral BID  . sennosides  5 mL Oral BID   Continuous Infusions: .  prismasol BGK 4/2.5 1,000 mL/hr at 03/06/19 0615  .  prismasol BGK 4/2.5 300 mL/hr at 03/06/19 0759  . sodium chloride    . sodium chloride 10 mL/hr at 03/06/19 0900  . cefTRIAXone (ROCEPHIN)  IV    . feeding supplement (VITAL AF 1.2 CAL) 1,000 mL (03/05/19 1620)  . heparin 10,000 units/ 20 mL infusion syringe 500 Units/hr (03/06/19 0745)  . heparin 900 Units/hr (03/06/19 0900)  . HYDROmorphone    . midazolam 8 mg/hr (03/06/19 0945)  . prismasol BGK 2/2.5 dialysis solution 1,500 mL/hr at 03/06/19 0934   PRN Meds:.Place/Maintain arterial line **AND** sodium chloride, sodium chloride, acetaminophen, heparin, HYDROmorphone, labetalol, LORazepam, midazolam, vecuronium  ABG    Component Value Date/Time   PHART 7.266 (L) 03/06/2019 0112   PCO2ART 56.5 (H) 03/06/2019 0112   PO2ART 86.0 03/06/2019 0112  HCO3 25.8 03/06/2019 0112   TCO2 27 03/06/2019 0112   ACIDBASEDEF 2.0 03/06/2019 0112   O2SAT 95.0 03/06/2019 0112    A/P 1. Diaalysis dependent AKI 2/2 ATN, COVID19 2. ARDS/Hypoxic VDRF 3. Systolic HF  4. DM2 5. Hyperkalemia, improved with change in dialysate to 2K  Cont current pre/post/dialysate flow rate, UF rate d/w CCM, they will adjust as indicated keep net even for now; Cont heparin 500u/hr fixed dose in CRRT circuit.     Pearson Grippe, MD [facility]

## 2019-03-06 NOTE — Progress Notes (Signed)
Patient unproned at 8441 without complications. Plan to reprone at 1800.

## 2019-03-06 NOTE — Progress Notes (Signed)
Wasted 75 ml of fentanyl gtt with Timmothy Sours, Therapist, sports.

## 2019-03-06 NOTE — Progress Notes (Signed)
Wife- Lorna Few [phone] (lives in [place]) Daughter- Pioneer email: [email or web address]

## 2019-03-07 ENCOUNTER — Inpatient Hospital Stay (HOSPITAL_COMMUNITY): Payer: HRSA Program

## 2019-03-07 LAB — RENAL FUNCTION PANEL
Albumin: 2.8 g/dL — ABNORMAL LOW (ref 3.5–5.0)
Albumin: 2.7 g/dL — ABNORMAL LOW (ref 3.5–5.0)
Anion gap: 13 (ref 5–15)
Anion gap: 12 (ref 5–15)
BUN: 40 mg/dL — ABNORMAL HIGH (ref 6–20)
BUN: 39 mg/dL — ABNORMAL HIGH (ref 6–20)
CO2: 24 mmol/L (ref 22–32)
CO2: 23 mmol/L (ref 22–32)
Calcium: 9 mg/dL (ref 8.9–10.3)
Calcium: 8.8 mg/dL — ABNORMAL LOW (ref 8.9–10.3)
Chloride: 98 mmol/L (ref 98–111)
Chloride: 98 mmol/L (ref 98–111)
Creatinine, Ser: 1.95 mg/dL — ABNORMAL HIGH (ref 0.61–1.24)
Creatinine, Ser: 1.96 mg/dL — ABNORMAL HIGH (ref 0.61–1.24)
GFR calc Af Amer: 46 mL/min — ABNORMAL LOW (ref >60)
GFR calc Af Amer: 46 mL/min — ABNORMAL LOW (ref >60)
GFR calc non Af Amer: 40 mL/min — ABNORMAL LOW (ref >60)
GFR calc non Af Amer: 39 mL/min — ABNORMAL LOW (ref >60)
Glucose, Bld: 181 mg/dL — ABNORMAL HIGH (ref 70–99)
Glucose, Bld: 199 mg/dL — ABNORMAL HIGH (ref 70–99)
Phosphorus: 3.8 mg/dL (ref 2.5–4.6)
Phosphorus: 3.2 mg/dL (ref 2.5–4.6)
Potassium: 4.3 mmol/L (ref 3.5–5.1)
Potassium: 4.4 mmol/L (ref 3.5–5.1)
Sodium: 135 mmol/L (ref 135–145)
Sodium: 133 mmol/L — ABNORMAL LOW (ref 135–145)

## 2019-03-07 LAB — HEPARIN LEVEL (UNFRACTIONATED)
Heparin Unfractionated: 0.38 IU/mL (ref 0.30–0.70)
Heparin Unfractionated: 0.23 IU/mL — ABNORMAL LOW (ref 0.30–0.70)

## 2019-03-07 LAB — CBC
HCT: 36 % — ABNORMAL LOW (ref 39.0–52.0)
Hemoglobin: 11.2 g/dL — ABNORMAL LOW (ref 13.0–17.0)
MCH: 30 pg (ref 26.0–34.0)
MCHC: 31.1 g/dL (ref 30.0–36.0)
MCV: 96.5 fL (ref 80.0–100.0)
Platelets: 185 10*3/uL (ref 150–400)
RBC: 3.73 MIL/uL — ABNORMAL LOW (ref 4.22–5.81)
RDW: 15.8 % — ABNORMAL HIGH (ref 11.5–15.5)
WBC: 19.7 10*3/uL — ABNORMAL HIGH (ref 4.0–10.5)
nRBC: 0.2 % (ref 0.0–0.2)

## 2019-03-07 LAB — GLUCOSE, CAPILLARY
Glucose-Capillary: 179 mg/dL — ABNORMAL HIGH (ref 70–99)
Glucose-Capillary: 193 mg/dL — ABNORMAL HIGH (ref 70–99)
Glucose-Capillary: 186 mg/dL — ABNORMAL HIGH (ref 70–99)
Glucose-Capillary: 152 mg/dL — ABNORMAL HIGH (ref 70–99)
Glucose-Capillary: 164 mg/dL — ABNORMAL HIGH (ref 70–99)

## 2019-03-07 LAB — MAGNESIUM: Magnesium: 2.7 mg/dL — ABNORMAL HIGH (ref 1.7–2.4)

## 2019-03-07 LAB — D-DIMER, QUANTITATIVE (NOT AT ARMC): D-Dimer, Quant: 9.49 ug/mL-FEU — ABNORMAL HIGH (ref 0.00–0.50)

## 2019-03-07 LAB — TROPONIN I: Troponin I: <0.03 ng/mL (ref <0.03)

## 2019-03-07 MED ORDER — SODIUM CHLORIDE 0.9% IV SOLUTION: Freq: Once | INTRAVENOUS | Status: DC

## 2019-03-07 NOTE — Progress Notes (Signed)
[place] per for heparin Indication: COVID+ with elevated d-dimer   Patient Measurements: Height: 5\' 5"  (165.1 cm) Weight: 153 lb 14.1 oz (69.8 kg) IBW/kg (Calculated) : 61.5  Vital Signs: Temp: 98.1 F (36.7 C) (05/03 0834) Temp Source: Oral (05/03 0834) BP: 159/66 (05/03 1023) Pulse Rate: 87 (05/03 1900)  Labs: Recent Labs    03/05/19 0900  03/06/19 0112 03/06/19 0301 03/06/19 1800 03/07/19 0324 03/07/19 1820 03/07/19 1821  HGB 11.7*  --  11.9* 11.1*  --  11.2*  --   --   HCT 38.4*  --  35.0* 36.0*  --  36.0*  --   --   PLT 210  --   --  225  --  185  --   --   HEPARINUNFRC  --   --   --  0.20*  --  0.38  --  0.23*  CREATININE  --    < >  --  2.48* 2.32* 1.95* 1.96*  --   TROPONINI  --   --   --  0.04*  --  <0.03  --   --    < > = values in this interval not displayed.     Assessment: 48 yo M admitted with respiratory distress, cough and AMS found to be COVID+. D-dimers elevated on 4/21 at 11.18, remains elevated this am 13.1 Patient continues on CRRT which was started 4/22. heparin drip due concerns for increased coagulopathy with elevated d-dimer and COVID-19 diagnosis.   Continues w fixed dose heparin through circuit. Heparin level is now at goal at 0.23. No bleeding noted. CBC stable.   Goal of Therapy:  Heparin level 0.1 - 0.25 units/mL Monitor platelets by anticoagulation protocol: Yes   Plan:  Continue heparin gtt 700 units/hr Daily heparin level and CBC  Salome Arnt, PharmD, BCPS Please see AMION for all pharmacy numbers 03/07/2019 7:19 PM

## 2019-03-07 NOTE — Progress Notes (Signed)
Jonathan [name] per for heparin Indication: COVID+ with elevated d-dimer   Patient Measurements: Height: 5\' 5"  (165.1 cm) Weight: 153 lb 14.1 oz (69.8 kg) IBW/kg (Calculated) : 61.5  Vital Signs: Temp: 98.1 F (36.7 C) (05/03 0834) Temp Source: Oral (05/03 0834) BP: 159/66 (05/03 1023) Pulse Rate: 103 (05/03 1023)  Labs: Recent Labs    03/05/19 0414  03/05/19 0900  03/06/19 0112 03/06/19 0301 03/06/19 1800 03/07/19 0324  HGB  --    < > 11.7*  --  11.9* 11.1*  --  11.2*  HCT  --    < > 38.4*  --  35.0* 36.0*  --  36.0*  PLT  --   --  210  --   --  225  --  185  HEPARINUNFRC 0.10*  --   --   --   --  0.20*  --  0.38  CREATININE 2.33*  --   --    < >  --  2.48* 2.32* 1.95*  TROPONINI  --   --   --   --   --  0.04*  --  <0.03   < > = values in this interval not displayed.     Assessment: 48 yo M admitted with respiratory distress, cough and AMS found to be COVID+. D-dimers elevated on 4/21 at 11.18, remains elevated this am 13.1 Patient continues on CRRT which was started 4/22. heparin drip due concerns for increased coagulopathy with elevated d-dimer and COVID-19 diagnosis.   Continues w fixed dose heparin through circuit Hep lvl this am 0.38 - no bleeding H&H 11.2/36, Plt 185  Goal of Therapy:  Heparin level 0.1 - 0.25 units/mL Monitor platelets by anticoagulation protocol: Yes   Plan:  Decrease heparin gtt to 700 units/hr Next lvl 1800  Levester Fresh, PharmD, BCPS, BCCCP Clinical Pharmacist [phone]  Please check AMION for all [place] numbers  03/07/2019 10:48 AM

## 2019-03-07 NOTE — Progress Notes (Signed)
NAME:  Jonathan [name], MRN:  [id number], DOB:  25-Sep-1971, LOS: 12 ADMISSION DATE:  02/23/2019, CONSULTATION DATE:  02/23/2019 REFERRING MD:  [facility], CHIEF COMPLAINT:  Respiratory failure   Brief History   48 year old man intubated for severe respiratory failure requiring prone ventilation and nitric oxide for ARDS related to COVID-19. 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  Past Medical History   T2DM recently diagnosed on OHA.   Significant Hospital Events   4/21 admitted and immediately proned on inverse ratio PC ventilation with high PEEP to maintain saturation >85%, Started on NO at 40ppm. 4/22- Tocluzimab X 1 4/21-4/24 Proned daily 4/28 -4/30 proned daily , tapered NO to off  4/30 -late evening with increased O2 demands . No paralytics required x 24hr . No vent dysyncrony . Calm on Fent /Versed. Off precedex   5/1 - Remains on CRRT , no acute issues overnight --4.8 L I/O Balance since admit . Continues to Prone on/off 16 on and 8 off . -CXR stable diffuse bilateral infiltrates (slightly improved aeration ) . , Vent .80Fio2 .   Biomarkes improving but severe ARDS persists ad D-dimer still very high (13) without improvement. On IV Heparin gtt. Growing MSSA. SEdation changed to dilaudid gtt. Continues prone 16h and 8h syupine. Currently supine at 80% fio2, peep 18. On TF. STARTED NIMBEX    Consults:  PCCM 4/21  Procedures:  4/21 - ETT 7.5 >> 4/21 - RIJ CVC [facility] >>  Significant Diagnostic Tests:  POC U/S 4/21: Bilateral interstitial pattern with bilateral dense consolidation at bases. Echo shows low-normal LVSF. RV moderately dilated with septal shift.   Micro Data:  Blood cultures -PND SARS-CoV2 02/23/2019 - - POSITIVE HIV 4/21 -neg Quant gold 4/2 1- neg .....................Marland Kitchen resp 4/29 >>rare MSSA and yeast   Anti-covid RX and antimicrobial RX   Azithromycin 4/21 >>4/25 Ceftriaxone 4/21 >>4/27 Actemra 4/22 Hydroxychloroquine 4/21 > 4/22 ........................... Ceftriaxone (MSSA) 4/22 >>  Interim history/subjective:   03/06/2019 - nimbex 24h complete. Currently on supine. Prone/supine cycle continues. Fio2 improved to 60%, peep 18. On CRRT. Not on pressors   Objective   Blood pressure (!) 159/66, pulse 94, temperature 98.1 F (36.7 C), temperature source Oral, resp. rate (!) 30, height 5\' 5"  (1.651 m), weight 69.8 kg, SpO2 93 %.    Vent Mode: PRVC FiO2 (%):  [60 %-80 %] 60 % Set Rate:  [30 bmp] 30 bmp Vt Set:  [360 mL] 360 mL PEEP:  [16 cmH20-18 cmH20] 16 cmH20 Plateau Pressure:  [29 cmH20-31 cmH20] 29 cmH20   Intake/Output Summary (Last 24 hours) at 03/07/2019 1348 Last data filed at 03/07/2019 1200 Gross per 24 hour  Intake 1332.88 ml  Output 1751 ml  Net -418.12 ml   Filed Weights   03/05/19 0423 03/06/19 0235 03/07/19 0415  Weight: 69.4 kg 71.7 kg 69.8 kg   Due to Pandemic situation - this exam is an inspection exam from outside or via camera and with tactile part being shared findings with other care providers who have examined in last 60 minutes   General Appearance:  Looks criticall ill HEENT: looks intact Throat:  ETT TUBE - yes , OG tube - yes Neck:  Supple,  No enlargement/tenderness/nodules Lungs: Clear to auscultation bilaterally, Ventilator   Synchrony -yes, fio2 60%, peep 18,  Heart:  S1 and S2 normal, no murmur, CVP - x  Pressors -  lno Abdomen:  Not examined Genitalia / Rectal:  Not examined Extremities:  Extremities- intct Skin:  Intact in exposed areas . Sacral area - not examined Neurologic:  Sedation - dilaudid, versed, nimbex -> RASS - -4, BIS 30   LABS    PULMONARY Recent Labs  Lab 03/01/19 0135 03/01/19 0341  03/01/19 1051 03/02/19 0321 03/02/19 0335 03/03/19 0441 03/06/19 0112  PHART 7.249* 7.259*  --  7.342* 7.283*  --   --  7.266*  PCO2ART 60.3* 57.7*  --  47.7 53.7*  --    --  56.5*  PO2ART 68.0* 99.0  --  263.0* 72.0*  --   --  86.0  HCO3 26.5 25.9  --  26.1 25.6  --   --  25.8  TCO2 28 28  --  28 27  --   --  27  O2SAT 90.0 96.0   < > 100.0 92.0 83.8 78.7 95.0   < > = values in this interval not displayed.    CBC Recent Labs  Lab 03/05/19 0900 03/06/19 0112 03/06/19 0301 03/07/19 0324  HGB 11.7* 11.9* 11.1* 11.2*  HCT 38.4* 35.0* 36.0* 36.0*  WBC 17.0*  --  16.9* 19.7*  PLT 210  --  225 185    COAGULATION No results for input(s): INR in the last 168 hours.  CARDIAC   Recent Labs  Lab 03/06/19 0301 03/07/19 0324  TROPONINI 0.04* <0.03   No results for input(s): PROBNP in the last 168 hours.   CHEMISTRY Recent Labs  Lab 03/03/19 0358  03/04/19 0505  03/05/19 0414 03/05/19 1635 03/06/19 0112 03/06/19 0301 03/06/19 1800 03/07/19 0324  NA  --    < > 135   < > 134* 135 133* 135 134* 135  K  --    < > 4.9   < > 4.2 4.2 4.4 4.4 3.9 4.3  CL  --    < > 98   < > 98 99  --  102 100 98  CO2  --    < > 28   < > 24 24  --  20* 23 24  GLUCOSE  --    < > 178*   < > 130* 157*  --  192* 163* 181*  BUN  --    < > 32*   < > 39* 39*  --  43* 45* 40*  CREATININE  --    < > 2.46*   < > 2.33* 2.38*  --  2.48* 2.32* 1.95*  CALCIUM  --    < > 8.7*   < > 9.0 8.9  --  8.7* 8.7* 9.0  MG 2.6*  --  2.8*  --  2.8*  --   --  2.7*  --  2.7*  PHOS  --    < > 4.2   < > 4.2 4.1  --  4.0 3.7 3.8   < > = values in this interval not displayed.   Estimated Creatinine Clearance: 40.3 mL/min (A) (by C-G formula based on SCr of 1.95 mg/dL (H)).   LIVER Recent Labs  Lab 03/05/19 0414 03/05/19 1635 03/06/19 0301 03/06/19 1800 03/07/19 0324  ALBUMIN 3.1* 3.2* 3.0* 2.9* 2.8*     INFECTIOUS No results for input(s): LATICACIDVEN, PROCALCITON in the last 168 hours.   ENDOCRINE CBG (last 3)  Recent Labs    03/07/19 0417 03/07/19 0844 03/07/19 1208  GLUCAP 179* 193* 186*         IMAGING x48h  -  image(s) personally visualized  -   highlighted in  bold Dg Chest Port 1 View  Result Date: 03/06/2019 CLINICAL DATA:  Respiratory failure EXAM: PORTABLE CHEST 1 VIEW COMPARISON:  Yesterday FINDINGS: Endotracheal tube tip at the clavicular heads. The orogastric tube at least reaches the stomach. Right IJ line with tip near the SVC origin. Bilateral mainly peripheral airspace disease. Normal heart size. No effusion or pneumothorax. IMPRESSION: Stable hardware positioning and bilateral airspace disease. Electronically Signed   By: Monte Fantasia M.D.   On: 03/06/2019 07:05   Dg Chest Port 1 View  Result Date: 03/05/2019 CLINICAL DATA:  Intubated EXAM: PORTABLE CHEST 1 VIEW COMPARISON:  Chest radiograph from earlier today. FINDINGS: Endotracheal tube tip is 5.8 cm above the carina. Enteric tube enters stomach with the tip not seen on this image. Right internal jugular central venous catheter terminates in the upper third of the SVC. Left internal jugular central venous catheter terminates in the upper third of the SVC. Stable cardiomediastinal silhouette with normal heart size. No pneumothorax. No pleural effusion. Severe patchy opacities throughout both lungs, most prominent in peripheral left lung, not substantially changed. IMPRESSION: 1. Well-positioned support structures as detailed. 2. No substantial change in severe patchy bilateral lung opacities most prominent in the peripheral left lung, compatible with known COVID-19 pneumonia. Electronically Signed   By: Ilona Sorrel M.D.   On: 03/05/2019 18:43     Resolved Hospital Problem list     Assessment & Plan:  48 year old with severe ARDS, multiorgan failure due to COVID-19    ASSESSMENT / PLAN:  PULMONARY A:  Severe aRDS due to COVID-19. On prone.supine cycles.  High d-dimer + > 10 without change since admit. On nimbex since 03/06/2019  03/07/2019 -> fio2 60%, peep 18  P:   contniue prone 16h/supine 8h till fio2 60% and pep 10 Iv heparin for crrrt and high d-dimer Not a candidate for ECMO  Nimbex given severe ards persistence  X 48h - 72h atleast since 03/06/2019   NEUROLOGIC A:   Deep sedation due to aRDS  03/07/2019 - BIS shows adquate sedeation. Paralyzed +  P:   Dilaudid gtt Versed gtt Nimbex     VASCULAR A:   Maintains bp/hr  P:  MAP Goal > 65  CARDIAC A: Trop 0.04  P: Recheck 03/08/2019 and decide on echo based on that   INFECTIOUS A:   COVID-19 at admit 02/23/2019   03/07/2019 - MSSA pna   P:   Ceftriaxone since 03/06/2019  Convaslescent plasma on MAyp EAP protocol - meets I/e criteria  - - got phone verbal from daughter (LAR) 03/07/2019 in morning via interpreter. However, she needs to email back signed consent to Dr Chase Caller and then EAP protocol investigator has to sign consent. Only then rest of covid orders can proceed. AS of 2pm 03/06/3029 ICU team could not locate daughter again. So NO plasma till consent form signed  RENAL A:  ATN- anuric. On CRRT P:  Per renal - on CRRT (bad prognostic sign)  ELECTROLYTES A:  Acceptable lyte profile P: Monitor closely   GASTROINTESTINAL A:   On TF  P:   TF ppi  HEMATOLOGIC A:  Anemia critical illnes   P:  - PRBC for hgb </= 6.9gm%    - exceptions are   -  if ACS susepcted/confirmed then transfuse for hgb </= 8.0gm%,  or    -  active bleeding with hemodynamic instability, then transfuse regardless of hemoglobin value   At at  all times try to transfuse 1 unit prbc as possible with exception of active hemorrhage     ENDOCRINE A:   At risk hyperglycemia   P:   ssi lantus  MSK/DERM Facial pressure sores  Plan Skin protection   Best practice:  Diet: PEPUP protocol now that supine. Pain/Anxiety/Delirium protocol (if indicated): RASS goal -5,Fentanyl, Versed  klonopin, Oxycodone,  PRN lorazepam VAP protocol (if indicated): Bundle in place. DVT prophylaxis:Heparin gtt GI prophylaxis: Famotidine Glucose control: Lantus, SSI coverage. Mobility: Bedrest Code Status: Full  Family Communication: Family in [place]. Wife/daughter updated over the phone 4/29 & were able to see him over video with elink. FAmily daughter updated over phone 03/06/2019 and 03/07/2019  Disposition: ICU.       ATTESTATION & SIGNATURE   The patient Jonathan [name] is critically ill with multiple organ systems failure and requires high complexity decision making for assessment and support, frequent evaluation and titration of therapies, application of advanced monitoring technologies and extensive interpretation of multiple databases.   Critical Care Time devoted to patient care services described in this note is  30  Minutes. This time reflects time of care of this signee Dr Brand Males. This critical care time does not reflect procedure time, or teaching time or supervisory time of PA/NP/Med student/Med Resident etc but could involve care discussion time     Dr. Brand Males, M.D., [facility].C.P Pulmonary and Critical Care Medicine Staff Physician [facility] Pager: [phone], If no answer or between  15:00h - 7:00h: call [phone]  03/07/2019 1:48 PM

## 2019-03-07 NOTE — Progress Notes (Signed)
Patient position from prone to supine. Tolerated well. ET tube holder changed by RT. Foam under ET tube holder changed face cleansed.

## 2019-03-07 NOTE — Progress Notes (Signed)
Blood bank called re: plasma orders they will notify the supplier and call when it is available possible 24 hour to get plasma per blood bank.MD notified

## 2019-03-07 NOTE — Progress Notes (Signed)
Follow up from blood bank. Plasma will be arriving around 1930. Blood bank will call when plasma is ready MD notified

## 2019-03-07 NOTE — Procedures (Addendum)
Admit: 02/23/2019 LOS: 90  8M COVID with cardiogenic shock, hypoxic VDRF, AKI on CRRT  Current CRRT Prescription: Start Date: 4/22 Catheter: IJ BFR: 150-200 Pre Blood Pump: 1000 4k DFR: 1500 4K  Replacement Rate: 300 4k Goal UF: net even Anticoagulation: systemic heparin + 500 u/hr in CRRT circuit Clotting: improved  S:  Runnign net even  Possibly to rec convalescent serum  Pt not examined, d/w CCM RN  K 4.3, P 2.7  No UOP  Clotting improved with fixed dose CRRT heparin  Now on all 4K bath   O: 05/02 0701 - 05/03 0700 In: 1787 [I.V.:817; NG/GT:870; IV Piggyback:100] Out: 1899   Filed Weights   03/05/19 0423 03/06/19 0235 03/07/19 0415  Weight: 69.4 kg 71.7 kg 69.8 kg    Recent Labs  Lab 03/06/19 0301 03/06/19 1800 03/07/19 0324  NA 135 134* 135  K 4.4 3.9 4.3  CL 102 100 98  CO2 20* 23 24  GLUCOSE 192* 163* 181*  BUN 43* 45* 40*  CREATININE 2.48* 2.32* 1.95*  CALCIUM 8.7* 8.7* 9.0  PHOS 4.0 3.7 3.8   Recent Labs  Lab 03/03/19 0458 03/05/19 0900 03/06/19 0112 03/06/19 0301 03/07/19 0324  WBC 16.6* 17.0*  --  16.9* 19.7*  NEUTROABS 14.3*  --   --   --   --   HGB 11.9* 11.7* 11.9* 11.1* 11.2*  HCT 38.9* 38.4* 35.0* 36.0* 36.0*  MCV 95.3 97.5  --  97.6 96.5  PLT 208 210  --  225 185    Scheduled Meds: . artificial tears  1 application Both Eyes Z6X  . chlorhexidine gluconate (MEDLINE KIT)  15 mL Mouth Rinse BID  . clonazePAM  2 mg Per Tube Q6H  . docusate  100 mg Per Tube BID  . famotidine  20 mg Per Tube Daily  . feeding supplement (PRO-STAT SUGAR FREE 64)  60 mL Per Tube TID  . fentaNYL  2 patch Transdermal Q72H  . insulin aspart  0-15 Units Subcutaneous Q4H  . insulin aspart  6 Units Subcutaneous Q4H  . insulin glargine  8 Units Subcutaneous Daily  . mouth rinse  15 mL Mouth Rinse 10 times per day  . metoprolol tartrate  25 mg Oral BID  . oxyCODONE  10 mg Oral Q4H  . QUEtiapine  50 mg Oral BID  . sennosides  5 mL Oral BID    Continuous Infusions: .  prismasol BGK 4/2.5 1,000 mL/hr at 03/07/19 0629  .  prismasol BGK 4/2.5 300 mL/hr at 03/07/19 0629  . sodium chloride    . sodium chloride 10 mL/hr at 03/07/19 1000  . cefTRIAXone (ROCEPHIN)  IV Stopped (03/06/19 1158)  . cisatracurium (NIMBEX) infusion 2.5 mcg/kg/min (03/07/19 1000)  . feeding supplement (VITAL AF 1.2 CAL) 1,000 mL (03/06/19 2214)  . heparin 10,000 units/ 20 mL infusion syringe 500 Units/hr (03/07/19 0426)  . heparin 900 Units/hr (03/07/19 1000)  . HYDROmorphone 4 mg/hr (03/07/19 1000)  . midazolam 3 mg/hr (03/07/19 1000)  . prismasol BGK 4/2.5 1,500 mL/hr at 03/07/19 0849   PRN Meds:.Place/Maintain arterial line **AND** sodium chloride, sodium chloride, acetaminophen, heparin, HYDROmorphone, labetalol, LORazepam, midazolam, vecuronium  ABG    Component Value Date/Time   PHART 7.266 (L) 03/06/2019 0112   PCO2ART 56.5 (H) 03/06/2019 0112   PO2ART 86.0 03/06/2019 0112   HCO3 25.8 03/06/2019 0112   TCO2 27 03/06/2019 0112   ACIDBASEDEF 2.0 03/06/2019 0112   O2SAT 95.0 03/06/2019 0112    A/P 1. Diaalysis  dependent anuric AKI 2/2 ATN, COVID19 2. ARDS/Hypoxic VDRF 3. Systolic HF  4. DM2 5. Hyperkalemia, improved  Cont current pre/post/dialysate flow rate, Cont heparin 500u/hr fixed dose in CRRT circuit.     Pearson Grippe, MD [facility]

## 2019-03-07 NOTE — Progress Notes (Signed)
Chaplain attempted to call daughter Chrissie Noa per Dr. Chase Caller to confirm consent given to him.  Call went to voicemail. [phone]  Also, tried listed contact [phone], but this call did not go through.  Checking third emergency contact, [name], employer, who is checking contacts and will advise.     03/07/19 1700  Clinical Encounter Type  Visited With Patient not available  Visit Type Initial;Critical Care  Referral From Physician  Consult/Referral To Chaplain

## 2019-03-07 NOTE — Progress Notes (Signed)
Transfused COVID Convalescent Plasma without complication.

## 2019-03-07 NOTE — Progress Notes (Signed)
IBrand Males, MD, consented Subject [place] (male, Date of Birth [birth year], 48 y.o.) and with diagnosis of COVID-19, in the [facility] Expanded Access Program (EAP) Research Protocol for [employer] against COVID-19.  The consent took place under following circumstances.   Subject Capacity assessed by this investigator as:  Absence of emotional and mental capacity to consent (i.e. delirium, coma, ventilator).  Consent took place in the following setting(s):  Via video and/or telephone (Time of call: happened on 5/.12/2018 and 03/07/2019 with interpreter - all documented in epic)   The following were present for the consent process:  Industrial/product designer, identified as various on 03/27/2019 and 03/07/2019  in epic, has signed and dated the consent.  The consent has been added to subject's chart. Certified Translator used for consent process with non-english speaking subject/LAR.  Consent and cover letter was provided in subject's language.  Name/Interpreter ID: various, Employer: Engineer, site.   A copy of the signed consent form has been placed in the subject's physical chart and will be scanned into the electronic medical record upon discharge.  One copy was/will be provided to subject/LAR.  The original consent signature page will be kept with research records.  Statement of acknowledgement that the following was discussed with the subject/LAR:    1) Discussed the purpose of the research and procedures  2) Discussed risks and benefits and uncertainties of study participation 3) Discussed subject's responsibilities  4) Discussed the measures in place to maintain subject's confidentiality while a participant on the trial  5) Discussed alternatives to study participation.   6) Discussed study participation is voluntary and that the subject's care would not be jeopardized if they declined participation in the study.   7) Discussed freedom to  withdraw at any time.   8) All subject/LAR questions were answered to their satisfaction.   9) In case of emergency consent, investigator agreed to discuss with subject/LAR at earliest available opportunity when the subject stabilizes and/or LAR can be located.     Final Investigator Signature  [place], [place] [phone]   Date: 03/07/2019 and 3:54 PM

## 2019-03-07 NOTE — Progress Notes (Signed)
Marina with [employer]. ID # is [id number]   Spoke over phone to daughter Chrissie Noa who is in [place]. She is aged 48. Asked her if she got my email - she answered yes. She answered yes to printing and reading the consent. She also replied yes to the mom reading the consent.  -> Lost contact 9:34 AM 03/07/2019 . At this point     SIGNATURE    Dr. Brand Males, M.D., F.C.C.P,  Pulmonary and Critical Care Medicine Staff Physician, [place] Director - Interstitial Lung Disease  Program  Pulmonary [place] [facility] [place], [place], [address]  Pager: [phone], If no answer or between  15:00h - 7:00h: call [phone] Telephone: [phone]  9:34 AM 03/07/2019

## 2019-03-07 NOTE — Progress Notes (Signed)
This time with Bynum #[id number] 9:40 AM 03/07/2019   - ASked if daughter and mom are wanting to go ahead with convalescent plasma -> she replied yes. She says mother assigned her to be the main communication and Media planner. I confirmed this with charge RN - Daron Offer and 67M ICU secretary - Caryl Comes. Again explained that this is a research protocol and went over concepts of below. She has agreed to send it back via email (The signed consent). Call concluded 9:47 AM 03/07/2019     1. Scientific Purpose  Clinical research is designed to produce generalizable knowledge and to answer questions about the safety and efficacy of intervention(s) under study in order to determine whether or not they may be useful for the care of future patients.  2. Study Procedures  Participation in a trial may involve procedures or tests, in addition to the intervention(s) under study, that are intended only or primarily to generate scientific knowledge and that are otherwise not necessary for patient care.   3. Uncertainty  For intervention(s) under study in clinical research, there often is less knowledge and more uncertainty about the risks and benefits to a population of trial participants than there is when a doctor offers a patient standard interventions.  4 Patient as Visual merchandiser Subject  Patients participating in research trials are research subjects or volunteers. In other words participating in research is 100% voluntary and at one's own free weill. The decision to participate or not participate will NOT affect patient care and the doctor-patient relationship in any way     SIGNATURE    Dr. Brand Males, M.D., F.C.C.P, ACRP-CPI Pulmonary and [place], [facility] [place] Staff Physician, [place] Director - Interstitial Lung Disease  Program  Pulmonary [place]  Pulmonary and [facility] @ [place], [place], [address]  Pager: [phone], If no answer or between  15:00h - 7:00h: call [phone] Telephone: [phone]  9:46 AM 03/07/2019

## 2019-03-07 NOTE — Progress Notes (Addendum)
Patient proned tolerated well. Foam dressing placed on bilateral knees shins and tops of feet for protection while proned

## 2019-03-07 NOTE — Progress Notes (Addendum)
Chaplain confirmed telephone number with employer, [name], and successfully placed call to [place], pt.'s daughter at [phone].  Per Dr. Chase Caller, chaplain asked daughter, and [facility] interpreter, Ernest Mallick [phone]), subsequently repeated questions and confirmed daughter's answers.    Daughter confirmed that she gave her consent to Dr. Chase Caller for the plasma treatments for pt, and she gave consent of her own free will.  When asked if she understood what the doctor told her, she replied, "more or less".  This chaplain and interpreter are impartial witnesses to the family member's responses.   Chaplain asked if the daughter had any questions, and she asked how her father was doing. Chaplain said that RN Kieth Brightly had said that pt was recently turned without complications and that the day has continued as usual for pt.    Minus Liberty, [place]

## 2019-03-07 NOTE — Progress Notes (Signed)
Change line and extension to versed due to line expired. Took 45 ml of versed to prime line. witnessed with [facility] RN

## 2019-03-08 ENCOUNTER — Inpatient Hospital Stay (HOSPITAL_COMMUNITY): Payer: HRSA Program

## 2019-03-08 DIAGNOSIS — J9601 Acute respiratory failure with hypoxia: Secondary | ICD-10-CM

## 2019-03-08 LAB — POCT I-STAT 7, (LYTES, BLD GAS, ICA,H+H)
Acid-base deficit: 1 mmol/L (ref 0.0–2.0)
Acid-base deficit: 2 mmol/L (ref 0.0–2.0)
Bicarbonate: 25.7 mmol/L (ref 20.0–28.0)
Bicarbonate: 24.5 mmol/L (ref 20.0–28.0)
Calcium, Ion: 1.3 mmol/L (ref 1.15–1.40)
Calcium, Ion: 1.29 mmol/L (ref 1.15–1.40)
HCT: 29 % — ABNORMAL LOW (ref 39.0–52.0)
HCT: 30 % — ABNORMAL LOW (ref 39.0–52.0)
Hemoglobin: 9.9 g/dL — ABNORMAL LOW (ref 13.0–17.0)
Hemoglobin: 10.2 g/dL — ABNORMAL LOW (ref 13.0–17.0)
O2 Saturation: 98 %
O2 Saturation: 89 %
Patient temperature: 97.4
Patient temperature: 95
Potassium: 3.9 mmol/L (ref 3.5–5.1)
Potassium: 3.8 mmol/L (ref 3.5–5.1)
Sodium: 134 mmol/L — ABNORMAL LOW (ref 135–145)
Sodium: 135 mmol/L (ref 135–145)
TCO2: 27 mmol/L (ref 22–32)
TCO2: 26 mmol/L (ref 22–32)
pCO2 arterial: 49.4 mmHg — ABNORMAL HIGH (ref 32.0–48.0)
pCO2 arterial: 44.1 mmHg (ref 32.0–48.0)
pH, Arterial: 7.322 — ABNORMAL LOW (ref 7.350–7.450)
pH, Arterial: 7.343 — ABNORMAL LOW (ref 7.350–7.450)
pO2, Arterial: 117 mmHg — ABNORMAL HIGH (ref 83.0–108.0)
pO2, Arterial: 53 mmHg — ABNORMAL LOW (ref 83.0–108.0)

## 2019-03-08 LAB — CBC
HCT: 31.2 % — ABNORMAL LOW (ref 39.0–52.0)
HCT: 31.5 % — ABNORMAL LOW (ref 39.0–52.0)
HCT: 31.9 % — ABNORMAL LOW (ref 39.0–52.0)
Hemoglobin: 10 g/dL — ABNORMAL LOW (ref 13.0–17.0)
Hemoglobin: 9.5 g/dL — ABNORMAL LOW (ref 13.0–17.0)
Hemoglobin: 9.8 g/dL — ABNORMAL LOW (ref 13.0–17.0)
MCH: 29.3 pg (ref 26.0–34.0)
MCH: 30 pg (ref 26.0–34.0)
MCH: 30.2 pg (ref 26.0–34.0)
MCHC: 30.4 g/dL (ref 30.0–36.0)
MCHC: 31.1 g/dL (ref 30.0–36.0)
MCHC: 31.3 g/dL (ref 30.0–36.0)
MCV: 95.8 fL (ref 80.0–100.0)
MCV: 96.3 fL (ref 80.0–100.0)
MCV: 96.9 fL (ref 80.0–100.0)
Platelets: 150 10*3/uL (ref 150–400)
Platelets: 153 10*3/uL (ref 150–400)
Platelets: 157 10*3/uL (ref 150–400)
RBC: 3.24 MIL/uL — ABNORMAL LOW (ref 4.22–5.81)
RBC: 3.25 MIL/uL — ABNORMAL LOW (ref 4.22–5.81)
RBC: 3.33 MIL/uL — ABNORMAL LOW (ref 4.22–5.81)
RDW: 15.9 % — ABNORMAL HIGH (ref 11.5–15.5)
RDW: 16.1 % — ABNORMAL HIGH (ref 11.5–15.5)
RDW: 16.3 % — ABNORMAL HIGH (ref 11.5–15.5)
WBC: 15.1 10*3/uL — ABNORMAL HIGH (ref 4.0–10.5)
WBC: 15.2 10*3/uL — ABNORMAL HIGH (ref 4.0–10.5)
WBC: 16.5 10*3/uL — ABNORMAL HIGH (ref 4.0–10.5)
nRBC: 0.3 % — ABNORMAL HIGH (ref 0.0–0.2)
nRBC: 0.3 % — ABNORMAL HIGH (ref 0.0–0.2)
nRBC: 0.5 % — ABNORMAL HIGH (ref 0.0–0.2)

## 2019-03-08 LAB — PREPARE FRESH FROZEN PLASMA

## 2019-03-08 LAB — HEPATIC FUNCTION PANEL
ALT: 29 U/L (ref 0–44)
AST: 26 U/L (ref 15–41)
Albumin: 2.6 g/dL — ABNORMAL LOW (ref 3.5–5.0)
Alkaline Phosphatase: 112 U/L (ref 38–126)
Bilirubin, Direct: 0.1 mg/dL (ref 0.0–0.2)
Indirect Bilirubin: 0.5 mg/dL (ref 0.3–0.9)
Total Bilirubin: 0.6 mg/dL (ref 0.3–1.2)
Total Protein: 6.5 g/dL (ref 6.5–8.1)

## 2019-03-08 LAB — POCT ACTIVATED CLOTTING TIME: Activated Clotting Time: 0 seconds

## 2019-03-08 LAB — RENAL FUNCTION PANEL
Albumin: 2.7 g/dL — ABNORMAL LOW (ref 3.5–5.0)
Albumin: 2.6 g/dL — ABNORMAL LOW (ref 3.5–5.0)
Anion gap: 10 (ref 5–15)
Anion gap: 9 (ref 5–15)
BUN: 41 mg/dL — ABNORMAL HIGH (ref 6–20)
BUN: 30 mg/dL — ABNORMAL HIGH (ref 6–20)
CO2: 22 mmol/L (ref 22–32)
CO2: 26 mmol/L (ref 22–32)
Calcium: 8.7 mg/dL — ABNORMAL LOW (ref 8.9–10.3)
Calcium: 8.5 mg/dL — ABNORMAL LOW (ref 8.9–10.3)
Chloride: 104 mmol/L (ref 98–111)
Chloride: 100 mmol/L (ref 98–111)
Creatinine, Ser: 1.81 mg/dL — ABNORMAL HIGH (ref 0.61–1.24)
Creatinine, Ser: 1.32 mg/dL — ABNORMAL HIGH (ref 0.61–1.24)
GFR calc Af Amer: 50 mL/min — ABNORMAL LOW (ref >60)
GFR calc Af Amer: >60 mL/min (ref >60)
GFR calc non Af Amer: 43 mL/min — ABNORMAL LOW (ref >60)
GFR calc non Af Amer: >60 mL/min (ref >60)
Glucose, Bld: 153 mg/dL — ABNORMAL HIGH (ref 70–99)
Glucose, Bld: 133 mg/dL — ABNORMAL HIGH (ref 70–99)
Phosphorus: 2.6 mg/dL (ref 2.5–4.6)
Phosphorus: 1.7 mg/dL — ABNORMAL LOW (ref 2.5–4.6)
Potassium: 3.9 mmol/L (ref 3.5–5.1)
Potassium: 3.9 mmol/L (ref 3.5–5.1)
Sodium: 136 mmol/L (ref 135–145)
Sodium: 135 mmol/L (ref 135–145)

## 2019-03-08 LAB — GLUCOSE, CAPILLARY
Glucose-Capillary: 112 mg/dL — ABNORMAL HIGH (ref 70–99)
Glucose-Capillary: 124 mg/dL — ABNORMAL HIGH (ref 70–99)
Glucose-Capillary: 127 mg/dL — ABNORMAL HIGH (ref 70–99)
Glucose-Capillary: 130 mg/dL — ABNORMAL HIGH (ref 70–99)
Glucose-Capillary: 135 mg/dL — ABNORMAL HIGH (ref 70–99)
Glucose-Capillary: 164 mg/dL — ABNORMAL HIGH (ref 70–99)
Glucose-Capillary: 198 mg/dL — ABNORMAL HIGH (ref 70–99)

## 2019-03-08 LAB — HEPARIN LEVEL (UNFRACTIONATED): Heparin Unfractionated: 0.18 IU/mL — ABNORMAL LOW (ref 0.30–0.70)

## 2019-03-08 LAB — TROPONIN I: Troponin I: <0.03 ng/mL (ref <0.03)

## 2019-03-08 LAB — MAGNESIUM: Magnesium: 2.6 mg/dL — ABNORMAL HIGH (ref 1.7–2.4)

## 2019-03-08 LAB — PROTIME-INR
INR: 1.1 (ref 0.8–1.2)
Prothrombin Time: 14.2 seconds (ref 11.4–15.2)

## 2019-03-08 LAB — BPAM FFP
Blood Product Expiration Date: 202005041933
ISSUE DATE / TIME: 202005032206
Unit Type and Rh: 5100

## 2019-03-08 LAB — D-DIMER, QUANTITATIVE (NOT AT ARMC): D-Dimer, Quant: 6.59 ug/mL-FEU — ABNORMAL HIGH (ref 0.00–0.50)

## 2019-03-08 LAB — FERRITIN: Ferritin: 586 ng/mL — ABNORMAL HIGH (ref 24–336)

## 2019-03-08 LAB — LACTIC ACID, PLASMA: Lactic Acid, Venous: 1.2 mmol/L (ref 0.5–1.9)

## 2019-03-08 LAB — C-REACTIVE PROTEIN: CRP: 7.6 mg/dL — ABNORMAL HIGH (ref <1.0)

## 2019-03-08 MED ORDER — HEPARIN SODIUM (PORCINE) 5000 UNIT/ML IJ SOLN
5000.0000 [IU] | Freq: Three times a day (TID) | INTRAMUSCULAR | Status: DC
  Administered 2019-03-09 – 2019-03-11 (×7): 5000 [IU] via SUBCUTANEOUS
  Filled 2019-03-08 (×7): qty 1

## 2019-03-08 NOTE — Progress Notes (Signed)
RT walked by patient room and noted that sats were reading 85% with a good waveform and tachycardic.  RT and RN went into patient room.  ETT had been at 23 at the lip however ETT was noted to be at 20 at the lip.  Advanced ETT back to 23.  Gave 100% oxygen breaths and sats improved to 95%.  After 100% breath completed patient's sats began to drop again.  Increased FIO2 to 70% and sats at 90%.  RN currently calling MD at this time.  Will continue to monitor.

## 2019-03-08 NOTE — Progress Notes (Signed)
Assisted tele visit to patient with family member.  Nesreen Albano R, RN  

## 2019-03-08 NOTE — Progress Notes (Signed)
[facility] per for heparin Indication: COVID+ with elevated d-dimer   Patient Measurements: Height: 5\' 5"  (165.1 cm) Weight: 155 lb 6.8 oz (70.5 kg) IBW/kg (Calculated) : 61.5  Vital Signs: Temp: 98.2 F (36.8 C) (05/04 0000) Temp Source: Oral (05/04 0000) BP: 164/67 (05/04 1018) Pulse Rate: 100 (05/04 1018)  Labs: Recent Labs    03/06/19 0301  03/07/19 0324 03/07/19 1820 03/07/19 1821 03/08/19 0313 03/08/19 0427  HGB 11.1*  --  11.2*  --   --  10.0* 9.9*  HCT 36.0*  --  36.0*  --   --  31.9* 29.0*  PLT 225  --  185  --   --  157  --   LABPROT  --   --   --   --   --  14.2  --   INR  --   --   --   --   --  1.1  --   HEPARINUNFRC 0.20*  --  0.38  --  0.23* 0.18*  --   CREATININE 2.48*   < > 1.95* 1.96*  --  1.81*  --   TROPONINI 0.04*  --  <0.03  --   --  <0.03  --    < > = values in this interval not displayed.     Assessment: 48 yo M admitted with respiratory distress, cough and AMS found to be COVID+. D-dimers elevated on 4/21 at 11.18, remains elevated this am at 6.59.  Patient continues on CRRT, which was started 4/22. Heparin drip started due concerns for increased coagulopathy with elevated d-dimer and COVID-19 diagnosis.   Continues with fixed-dose heparin through circuit. Heparin level remains at goal at 0.18. RN reported bloody BM, CCM aware and continuing heparin for now - they will f/u with Pharmacy with any changes and RN to monitor. CBC trend down - Hg 9.9, plt 157.  Goal of Therapy:  Heparin level 0.1 - 0.25 units/mL Monitor platelets by anticoagulation protocol: Yes   Plan:  Continue heparin drip at 700 units/hr Monitor daily heparin level and CBC, s/sx bleeding  Elicia Lamp, PharmD, BCPS Please check AMION for all [facility] contact numbers Clinical Pharmacist 03/08/2019 10:36 AM

## 2019-03-08 NOTE — Progress Notes (Signed)
NAME:  Jonathan [name], MRN:  [id number], DOB:  February 04, 1971, LOS: 49 ADMISSION DATE:  02/23/2019, CONSULTATION DATE:  02/23/2019 REFERRING MD:  [facility], CHIEF COMPLAINT:  Respiratory failure   Brief History   48 year old man intubated for severe respiratory failure requiring prone ventilation and nitric oxide for ARDS related to COVID-19. 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   Past Medical History  T2DM recently diagnosed on OHA.   Significant Hospital Events   4/21 admitted and immediately proned on inverse ratio PC ventilation with high PEEP to maintain saturation >85%, Started on NO at 40ppm. 4/22- Tocluzimab X 1 4/21-4/24 Proned daily 4/28 -4/30 proned daily , tapered NO to off  4/30 -late evening with increased O2 demands . No paralytics required x 24hr . No vent dysyncrony . Calm on Fent /Versed. Off precedex   5/1 - Remains on CRRT , no acute issues overnight --4.8 L I/O Balance since admit . Continues to Prone on/off 16 on and 8 off . -CXR stable diffuse bilateral infiltrates (slightly improved aeration ) . , Vent .80Fio2 .   Biomarkes improving but severe ARDS persists ad D-dimer still very high (13) without improvement. On IV Heparin gtt. Growing MSSA. SEdation changed to dilaudid gtt. Continues prone 16h and 8h syupine. Currently supine at 80% fio2, peep 18. On TF. STARTED NIMBEX    Consults:  PCCM 4/21  Procedures:  4/21 - ETT 7.5 >> 4/21 - RIJ CVC [facility] >>  Significant Diagnostic Tests:  POC U/S 4/21: Bilateral interstitial pattern with bilateral dense consolidation at bases. Echo shows low-normal LVSF. RV moderately dilated with septal shift.   Micro Data:  Blood cultures -PND SARS-CoV2 02/23/2019 - - POSITIVE HIV 4/21 -neg Quant gold 4/2 1- neg .....................Marland Kitchen resp 4/29 >>rare MSSA and yeast   Anti-covid RX and antimicrobial RX   Azithromycin 4/21 >>4/25 Ceftriaxone 4/22 >>4/25 Actemra 4/22 Hydroxychloroquine 4/21 > 4/22 ........................... Ceftriaxone 5/2 >>   Interim history/subjective:  Improved gas exchange with paralysis, currently on FiO2 60, PEEP 10. No longer on nitric oxide Change back from prone position to supine position this morning, tolerated Developed bloody bowel movement this morning 5/4   Objective   Blood pressure (!) 164/67, pulse 92, temperature 98.2 F (36.8 C), temperature source Oral, resp. rate (!) 30, height 5\' 5"  (1.651 m), weight 70.5 kg, SpO2 95 %. CVP:  [6 mmHg] 6 mmHg  Vent Mode: PRVC FiO2 (%):  [60 %] 60 % Set Rate:  [30 bmp] 30 bmp Vt Set:  [360 mL] 360 mL PEEP:  [10 cmH20-12 cmH20] 10 cmH20 Plateau Pressure:  [23 cmH20-25 cmH20] 24 cmH20   Intake/Output Summary (Last 24 hours) at 03/08/2019 1130 Last data filed at 03/08/2019 1000 Gross per 24 hour  Intake 1547.82 ml  Output 2358 ml  Net -810.18 ml   Filed Weights   03/06/19 0235 03/07/19 0415 03/08/19 0448  Weight: 71.7 kg 69.8 kg 70.5 kg     General Appearance: Critically ill, paralyzed, tolerating mechanical ventilation HEENT: ET tube in good position, pupils equal, sluggish, no oral lesions Neck: No apparent lymphadenopathy Lungs: Coarse bilaterally, few inspiratory crackles, no wheezes. Pplat 23  Heart: Regular, no murmur, hemodynamically stable off pressors Abdomen: Soft, nondistended, positive bowel sounds Genitalia / Rectal:  Not examined Extremities: Trace pretibial edema Skin: Some mild pressure changes both maxillary areas, dressed Neurologic: Paralyzed and sedated  LABS    PULMONARY Recent Labs  Lab 03/02/19 0321 03/02/19 0335 03/03/19 0441 03/06/19 0112 03/08/19 0427  PHART 7.283*  --   --  7.266* 7.322*  PCO2ART 53.7*  --   --  56.5* 49.4*  PO2ART 72.0*  --   --  86.0 117.0*  HCO3 25.6  --   --  25.8 25.7  TCO2 27  --   --  27 27  O2SAT 92.0 83.8 78.7 95.0 98.0    CBC  Recent Labs  Lab 03/06/19 0301 03/07/19 0324 03/08/19 0313 03/08/19 0427  HGB 11.1* 11.2* 10.0* 9.9*  HCT 36.0* 36.0* 31.9* 29.0*  WBC 16.9* 19.7* 15.1*  --   PLT 225 185 157  --     COAGULATION Recent Labs  Lab 03/08/19 0313  INR 1.1    CARDIAC   Recent Labs  Lab 03/06/19 0301 03/07/19 0324 03/08/19 0313  TROPONINI 0.04* <0.03 <0.03   No results for input(s): PROBNP in the last 168 hours.   CHEMISTRY Recent Labs  Lab 03/04/19 0505  03/05/19 0414  03/06/19 0301 03/06/19 1800 03/07/19 0324 03/07/19 1820 03/08/19 0313 03/08/19 0427  NA 135   < > 134*   < > 135 134* 135 133* 136 134*  K 4.9   < > 4.2   < > 4.4 3.9 4.3 4.4 3.9 3.9  CL 98   < > 98   < > 102 100 98 98 104  --   CO2 28   < > 24   < > 20* 23 24 23 22   --   GLUCOSE 178*   < > 130*   < > 192* 163* 181* 199* 153*  --   BUN 32*   < > 39*   < > 43* 45* 40* 39* 41*  --   CREATININE 2.46*   < > 2.33*   < > 2.48* 2.32* 1.95* 1.96* 1.81*  --   CALCIUM 8.7*   < > 9.0   < > 8.7* 8.7* 9.0 8.8* 8.7*  --   MG 2.8*  --  2.8*  --  2.7*  --  2.7*  --  2.6*  --   PHOS 4.2   < > 4.2   < > 4.0 3.7 3.8 3.2 2.6  --    < > = values in this interval not displayed.   Estimated Creatinine Clearance: 43.4 mL/min (A) (by C-G formula based on SCr of 1.81 mg/dL (H)).   LIVER Recent Labs  Lab 03/06/19 0301 03/06/19 1800 03/07/19 0324 03/07/19 1820 03/08/19 0313  AST  --   --   --   --  26  ALT  --   --   --   --  29  ALKPHOS  --   --   --   --  112  BILITOT  --   --   --   --  0.6  PROT  --   --   --   --  6.5  ALBUMIN 3.0* 2.9* 2.8* 2.7* 2.7*  2.6*  INR  --   --   --   --  1.1     INFECTIOUS Recent Labs  Lab 03/08/19 0000  LATICACIDVEN 1.2     ENDOCRINE CBG (last 3)  Recent Labs    03/08/19 0017 03/08/19 0427 03/08/19 0846  GLUCAP 198* 135* [facility] Problem list   Cardiogenic shock, cor pulmonale  Ileus   Assessment & Plan:  48 year old with severe ARDS, multiorgan  failure due to COVID-19   ASSESSMENT / PLAN:  Severe ARDS due to COVID-19.  Associated with elevated d-dimer.  Heavy sedation, now paralysis.  He has been on nitric oxide as well, currently off.  PEEP, FiO2 needs improved since paralysis.  5/4 = day 13 ventilation. Pplat >> 23 P:   Continue to try to wean PEEP and FiO2 as able. Has benefited from intermittent proning.  If his PEEP and FiO2 needs remain stable on this supine cycle then I may defer further proning today. IV heparin given persistently elevated d-dimer, held today due to hematochezia Continue paralysis for at least another 24 hours   Sedation needs for mechanical ventilation.  Requiring paralytics, deep sedation.  He has been on multiple enteral medications to supplement the IV regimen P:   Dilaudid infusion, Versed infusion, Nimbex Supplemental meds: -Clonazepam 2 mg every 6 hours -Oxycodone 10 mg every 4 hours -Fentanyl patches 200 mcg every 72 hours -Seroquel 50 mg every 12 hours   COVID-19 infection MSSA bronchitis vs PNA vs colonization P:   Has received Actemra, hydroxychloroquine as above Convalescent plasma given 5/3 x 1 as per investigational protocol (consent from daughter by phone Dr. Chase Caller) Ceftriaxone was restarted on 5/2 for MSSA on respiratory culture, plan 7 days   Acute renal failure due to ATN, requiring CVVHD. Unclear at this time whether he will have renal recovery P:  Continue CVVH, appreciate nephrology assistance.  Discussed today Goal continue volume removal as he can tolerate hemodynamically Replace electrolytes as indicated Avoid nephrotoxins  Hematochezia, started 5/4 on heparin infusion Nutritional needs P:   Heparin infusion stopped 5/4 Continue famotidine as ordered Follow CBC and stool output.  May need GI consultation, low threshold to call TF at trickle while paralyzed   Hyperglycemia P:   Lantus and SSI as ordered.   Facial pressure sores from prone positiong P: Skin  protection, padding in place No clear indication to have WOC assess at this time  Best practice:  Diet: trickle TF Pain/Anxiety/Delirium protocol (if indicated): RASS goal -5, Dilaudid, Versed gtts VAP protocol (if indicated): Bundle in place. DVT prophylaxis:heprain sq GI prophylaxis: Famotidine Glucose control: Lantus, SSI coverage. Mobility: Bedrest Code Status: Full Family Communication: Currently residing in [place].  Wife and daughter have been updated intermittently.  I was able to speak with his daughter via interpreter on 5/4 Disposition: ICU. He may be able to transfer to [facility] in the next 24h - ned to insure CBC stable, hemodynamics stable off heparin.   Independent CC time 35 minutes  Baltazar Apo, MD, PhD 03/08/2019, 12:13 PM [facility] [phone] or if no answer [phone]

## 2019-03-08 NOTE — Progress Notes (Signed)
[facility] NEPHROLOGY PROGRESS NOTE  Assessment/ Plan: Pt is a 48 y.o. yo male  COVID, cardiogenic shock, hypoxia, VDRF, AKI on CRRT.  #Anuric AKI due to ATN, cardiogenic shock: On CRRT since 4/22, continue.  Discussed with ICU team, change UF goal to 5200 cc an hour.  Currently on 4K bath.  Heparin for anticoagulation, no issue with the filter.  Has IJ catheter.  #COVID-19, ARDS, hypoxic respiratory failure on mechanical ventilation: Received plasma.  Per PCCM.  #Anemia in critical illness: Transfusion as needed.  #Encephalopathy  Subjective: Chart reviewed, discussed with ICU team. Objective Vital signs in last 24 hours: Vitals:   03/08/19 0500 03/08/19 0600 03/08/19 0700 03/08/19 0800  BP:      Pulse: 81 83 82 82  Resp: (!) 30 (!) 30 (!) 30 (!) 30  Temp:      TempSrc:      SpO2: 99% 98% 100% 99%  Weight:      Height:       Weight change: 0.7 kg  Intake/Output Summary (Last 24 hours) at 03/08/2019 0911 Last data filed at 03/08/2019 0700 Gross per 24 hour  Intake 1566.64 ml  Output 2302 ml  Net -735.36 ml       Labs: Basic Metabolic Panel: Recent Labs  Lab 03/07/19 0324 03/07/19 1820 03/08/19 0313 03/08/19 0427  NA 135 133* 136 134*  K 4.3 4.4 3.9 3.9  CL 98 98 104  --   CO2 _0 --   GLUCOSE 181* 199* 153*  --   BUN 40* 39* 41*  --   CREATININE 1.95* 1.96* 1.81*  --   CALCIUM 9.0 8.8* 8.7*  --   PHOS 3.8 3.2 2.6  --    Liver Function Tests: Recent Labs  Lab 03/07/19 0324 03/07/19 1820 03/08/19 0313  AST  --   --  26  ALT  --   --  29  ALKPHOS  --   --  112  BILITOT  --   --  0.6  PROT  --   --  6.5  ALBUMIN 2.8* 2.7* 2.7*  2.6*   No results for input(s): LIPASE, AMYLASE in the last 168 hours. No results for input(s): AMMONIA in the last 168 hours. CBC: Recent Labs  Lab 03/03/19 0458 03/05/19 0900  03/06/19 0301 03/07/19 0324 03/08/19 0313 03/08/19 0427  WBC 16.6* 17.0*  --  16.9* 19.7* 15.1*  --   NEUTROABS 14.3*   --   --   --   --   --   --   HGB 11.9* 11.7*   < > 11.1* 11.2* 10.0* 9.9*  HCT 38.9* 38.4*   < > 36.0* 36.0* 31.9* 29.0*  MCV 95.3 97.5  --  97.6 96.5 95.8  --   PLT 208 210  --  225 185 157  --    < > = values in this interval not displayed.   Cardiac Enzymes: Recent Labs  Lab 03/06/19 0301 03/07/19 0324 03/08/19 0313  TROPONINI 0.04* <0.03 <0.03   CBG: Recent Labs  Lab 03/07/19 1550 03/07/19 2035 03/08/19 0017 03/08/19 0427 03/08/19 0846  GLUCAP 152* 164* 198* 135* 127*    Iron Studies:  Recent Labs    03/08/19 0313  FERRITIN 586*   Studies/Results: Dg Chest Port 1 View  Result Date: 03/08/2019 CLINICAL DATA:  ARDS and coronavirus. EXAM: PORTABLE CHEST 1 VIEW COMPARISON:  Yesterday FINDINGS: Endotracheal tube tip at the clavicular heads. Bilateral central line with tip  at the [facility]. The orogastric tube tip reaches the stomach. Generalized airspace disease with patchy appearance that is stable. Normal heart size. No visible effusion or air leak. IMPRESSION: Stable hardware positioning and airspace disease. Electronically Signed   By: Monte Fantasia M.D.   On: 03/08/2019 05:33   Dg Chest Port 1 View  Result Date: 03/07/2019 CLINICAL DATA:  COVID-19 EXAM: PORTABLE CHEST 1 VIEW COMPARISON:  Portable exam 1403 hours compared to 03/06/2019 FINDINGS: Tip of endotracheal tube projects 3.2 cm above carina. Nasogastric tube extends into abdomen. BILATERAL jugular lines with tip projecting over SVC. Normal heart size, mediastinal contours, and pulmonary vascularity. Atherosclerotic calcification aorta. Patchy BILATERAL airspace infiltrates consistent with pneumonia. Observed infiltrates are more confluent at both lung bases and in RIGHT upper lobe, and improved in LEFT upper lobe. No pleural effusion or pneumothorax. IMPRESSION: BILATERAL pulmonary infiltrates, more confluent at both lung bases and RIGHT upper lobe. Improved aeration LEFT upper lobe. Electronically Signed   By: Lavonia Dana M.D.   On: 03/07/2019 14:49    Medications: Infusions: .  prismasol BGK 4/2.5 1,000 mL/hr at 03/08/19 0651  .  prismasol BGK 4/2.5 300 mL/hr at 03/07/19 2233  . sodium chloride    . sodium chloride 10 mL/hr at 03/08/19 0700  . cefTRIAXone (ROCEPHIN)  IV Stopped (03/07/19 1213)  . cisatracurium (NIMBEX) infusion 4 mcg/kg/min (03/08/19 0700)  . feeding supplement (VITAL AF 1.2 CAL) 1,000 mL (03/08/19 0249)  . heparin 10,000 units/ 20 mL infusion syringe 500 Units/hr (03/08/19 0052)  . heparin 700 Units/hr (03/08/19 0700)  . HYDROmorphone 4 mg/hr (03/08/19 0700)  . midazolam 6 mg/hr (03/08/19 0700)  . prismasol BGK 4/2.5 1,500 mL/hr at 03/08/19 6812    Scheduled Medications: . sodium chloride   Intravenous Once  . artificial tears  1 application Both Eyes X5T  . chlorhexidine gluconate (MEDLINE KIT)  15 mL Mouth Rinse BID  . clonazePAM  2 mg Per Tube Q6H  . docusate  100 mg Per Tube BID  . famotidine  20 mg Per Tube Daily  . feeding supplement (PRO-STAT SUGAR FREE 64)  60 mL Per Tube TID  . fentaNYL  2 patch Transdermal Q72H  . insulin aspart  0-15 Units Subcutaneous Q4H  . insulin aspart  6 Units Subcutaneous Q4H  . insulin glargine  8 Units Subcutaneous Daily  . mouth rinse  15 mL Mouth Rinse 10 times per day  . metoprolol tartrate  25 mg Oral BID  . oxyCODONE  10 mg Oral Q4H  . QUEtiapine  50 mg Oral BID  . sennosides  5 mL Oral BID    have reviewed scheduled and prn medications.   Deryck Hippler Prasad Carynn Felling 03/08/2019,9:11 AM  LOS: 13 days

## 2019-03-08 NOTE — Consult Note (Signed)
[employer] Nurse wound follow up Patient receiving care in [facility] 2M11.  Attempted to see wounds associated with proning positioning and ETT stabilization device.  The patient's primary RN told me the stabilization device has been changed already today.  She suggested we try to view the area tomorrow at 8 a.m. by contacting the RT at that time.  Val Riles, RN, MSN, CWOCN, CNS-BC, pager [phone]

## 2019-03-08 NOTE — Progress Notes (Signed)
B/p decreased o2 sat decreased see flow sheet Dr Brock Ra notified. Patient re proned.

## 2019-03-08 NOTE — Progress Notes (Signed)
Patient supine. HD catheter positional with increase in access pressures

## 2019-03-09 ENCOUNTER — Inpatient Hospital Stay (HOSPITAL_COMMUNITY): Payer: HRSA Program

## 2019-03-09 DIAGNOSIS — J80 Acute respiratory distress syndrome: Secondary | ICD-10-CM

## 2019-03-09 DIAGNOSIS — U071 COVID-19: Principal | ICD-10-CM

## 2019-03-09 DIAGNOSIS — J988 Other specified respiratory disorders: Secondary | ICD-10-CM

## 2019-03-09 LAB — GLUCOSE, CAPILLARY
Glucose-Capillary: 111 mg/dL — ABNORMAL HIGH (ref 70–99)
Glucose-Capillary: 122 mg/dL — ABNORMAL HIGH (ref 70–99)
Glucose-Capillary: 139 mg/dL — ABNORMAL HIGH (ref 70–99)
Glucose-Capillary: 124 mg/dL — ABNORMAL HIGH (ref 70–99)
Glucose-Capillary: 115 mg/dL — ABNORMAL HIGH (ref 70–99)
Glucose-Capillary: 141 mg/dL — ABNORMAL HIGH (ref 70–99)

## 2019-03-09 LAB — CBC
HCT: 29.9 % — ABNORMAL LOW (ref 39.0–52.0)
Hemoglobin: 9.2 g/dL — ABNORMAL LOW (ref 13.0–17.0)
MCH: 29.8 pg (ref 26.0–34.0)
MCHC: 30.8 g/dL (ref 30.0–36.0)
MCV: 96.8 fL (ref 80.0–100.0)
Platelets: 158 10*3/uL (ref 150–400)
RBC: 3.09 MIL/uL — ABNORMAL LOW (ref 4.22–5.81)
RDW: 16.1 % — ABNORMAL HIGH (ref 11.5–15.5)
WBC: 16.6 10*3/uL — ABNORMAL HIGH (ref 4.0–10.5)
nRBC: 0.5 % — ABNORMAL HIGH (ref 0.0–0.2)

## 2019-03-09 LAB — HEMOGLOBIN AND HEMATOCRIT, BLOOD
HCT: 26.2 % — ABNORMAL LOW (ref 39.0–52.0)
Hemoglobin: 8.1 g/dL — ABNORMAL LOW (ref 13.0–17.0)

## 2019-03-09 LAB — RENAL FUNCTION PANEL
Albumin: 2.5 g/dL — ABNORMAL LOW (ref 3.5–5.0)
Albumin: 2.5 g/dL — ABNORMAL LOW (ref 3.5–5.0)
Anion gap: 12 (ref 5–15)
Anion gap: 11 (ref 5–15)
BUN: 41 mg/dL — ABNORMAL HIGH (ref 6–20)
BUN: 43 mg/dL — ABNORMAL HIGH (ref 6–20)
CO2: 22 mmol/L (ref 22–32)
CO2: 23 mmol/L (ref 22–32)
Calcium: 8.3 mg/dL — ABNORMAL LOW (ref 8.9–10.3)
Calcium: 8.6 mg/dL — ABNORMAL LOW (ref 8.9–10.3)
Chloride: 100 mmol/L (ref 98–111)
Chloride: 100 mmol/L (ref 98–111)
Creatinine, Ser: 2.06 mg/dL — ABNORMAL HIGH (ref 0.61–1.24)
Creatinine, Ser: 2.15 mg/dL — ABNORMAL HIGH (ref 0.61–1.24)
GFR calc Af Amer: 43 mL/min — ABNORMAL LOW (ref >60)
GFR calc Af Amer: 41 mL/min — ABNORMAL LOW (ref >60)
GFR calc non Af Amer: 37 mL/min — ABNORMAL LOW (ref >60)
GFR calc non Af Amer: 35 mL/min — ABNORMAL LOW (ref >60)
Glucose, Bld: 108 mg/dL — ABNORMAL HIGH (ref 70–99)
Glucose, Bld: 137 mg/dL — ABNORMAL HIGH (ref 70–99)
Phosphorus: 2.9 mg/dL (ref 2.5–4.6)
Phosphorus: 2.5 mg/dL (ref 2.5–4.6)
Potassium: 4.2 mmol/L (ref 3.5–5.1)
Potassium: 4.3 mmol/L (ref 3.5–5.1)
Sodium: 134 mmol/L — ABNORMAL LOW (ref 135–145)
Sodium: 134 mmol/L — ABNORMAL LOW (ref 135–145)

## 2019-03-09 LAB — MAGNESIUM: Magnesium: 2.6 mg/dL — ABNORMAL HIGH (ref 1.7–2.4)

## 2019-03-09 LAB — D-DIMER, QUANTITATIVE: D-Dimer, Quant: 9.54 ug/mL-FEU — ABNORMAL HIGH (ref 0.00–0.50)

## 2019-03-09 MED ORDER — NOREPINEPHRINE 4 MG/250ML-% IV SOLN
INTRAVENOUS | Status: AC
  Filled 2019-03-09: qty 250

## 2019-03-09 MED ORDER — VITAL AF 1.2 CAL PO LIQD: 1000.0000 mL | ORAL | Status: DC

## 2019-03-09 MED ORDER — NOREPINEPHRINE 4 MG/250ML-% IV SOLN
0.0000 ug/min | INTRAVENOUS | Status: DC
  Administered 2019-03-09: 10 ug/min via INTRAVENOUS
  Administered 2019-03-10: 13 ug/min via INTRAVENOUS
  Administered 2019-03-14: 11:00:00 1.333 ug/min via INTRAVENOUS
  Filled 2019-03-09: qty 250

## 2019-03-09 MED ORDER — ALBUMIN HUMAN 25 % IV SOLN
50.0000 g | Freq: Once | INTRAVENOUS | Status: AC
  Administered 2019-03-09: 50 g via INTRAVENOUS
  Filled 2019-03-09: qty 100

## 2019-03-09 NOTE — Progress Notes (Signed)
NAME:  Jonathan [name], MRN:  [id number], DOB:  1971-03-12, LOS: 31 ADMISSION DATE:  02/23/2019, CONSULTATION DATE:  02/23/2019 REFERRING MD:  [facility], CHIEF COMPLAINT:  Respiratory failure   Brief History   48 year old man intubated for severe respiratory failure requiring prone ventilation and nitric oxide for ARDS related to COVID-19. 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   Past Medical History  T2DM recently diagnosed on OHA.   Significant Hospital Events   4/21 admitted and immediately proned on inverse ratio PC ventilation with high PEEP to maintain saturation >85%, Started on NO at 40ppm. 4/22- Tocluzimab X 1 4/21-4/24 Proned daily 4/28 -4/30 proned daily , tapered NO to off  4/30 -late evening with increased O2 demands . No paralytics required x 24hr . No vent dysyncrony . Calm on Fent /Versed. Off precedex   5/1 - Remains on CRRT , no acute issues overnight --4.8 L I/O Balance since admit . Continues to Prone on/off 16 on and 8 off . -CXR stable diffuse bilateral infiltrates (slightly improved aeration ) . , Vent .80Fio2 .   Biomarkes improving but severe ARDS persists ad D-dimer still very high (13) without improvement. On IV Heparin gtt. Growing MSSA. SEdation changed to dilaudid gtt. Continues prone 16h and 8h syupine. Currently supine at 80% fio2, peep 18. On TF. STARTED NIMBEX    Consults:  PCCM 4/21  Procedures:  4/21 - ETT 7.5 >> 4/21 - RIJ CVC [facility] >>  Significant Diagnostic Tests:  POC U/S 4/21: Bilateral interstitial pattern with bilateral dense consolidation at bases. Echo shows low-normal LVSF. RV moderately dilated with septal shift.   Micro Data:  Blood cultures -PND SARS-CoV2 02/23/2019 - - POSITIVE HIV 4/21 -neg Quant gold 4/2 1- neg .....................Marland Kitchen resp 4/29 >>rare MSSA and yeast   Anti-covid RX and antimicrobial RX   Azithromycin 4/21 >>4/25 Ceftriaxone 4/22 >>4/25 Actemra 4/22 Hydroxychloroquine 4/21 > 4/22 ........................... Ceftriaxone 5/2 >>   Interim history/subjective:  No further bloody bowel movement noted, heparin is off Some transient desaturation and hypotension in the afternoon 5/4.  Actually improved when he was placed back in the prone position. He remains currently in the prone position   Objective   Blood pressure 107/72, pulse 80, temperature (!) 96.9 F (36.1 C), temperature source Rectal, resp. rate (!) 30, height 5\' 5"  (1.651 m), weight 71 kg, SpO2 100 %. CVP:  [7 mmHg-12 mmHg] 12 mmHg  Vent Mode: PRVC FiO2 (%):  [50 %-70 %] 50 % Set Rate:  [30 bmp] 30 bmp Vt Set:  [360 mL] 360 mL PEEP:  [10 cmH20] 10 cmH20 Plateau Pressure:  [24 cmH20-29 cmH20] 24 cmH20   Intake/Output Summary (Last 24 hours) at 03/09/2019 1046 Last data filed at 03/09/2019 1000 Gross per 24 hour  Intake 1417.93 ml  Output 1968 ml  Net -550.07 ml   Filed Weights   03/07/19 0415 03/08/19 0448 03/09/19 0500  Weight: 69.8 kg 70.5 kg 71 kg     General Appearance: Acutely ill, paralyzed, mechanically ventilated.  Prone position HEENT: ET tube in place, no apparent cuff leak.  Foam protection in the prone position Neck: No lymphadenopathy Lungs: Very distant breath sounds, few bilateral inspiratory crackles Heart: Regular, no murmur.  No pressors Abdomen: Not examined Genitalia / Rectal:  Not examined Extremities: No significant lower extremity edema Skin: Some mild pressure changes both maxillary areas, dressed Neurologic:  Paralyzed and sedated   LABS    PULMONARY Recent Labs  Lab 03/03/19 0441 03/06/19 0112 03/08/19 0427 03/08/19 1415  PHART  --  7.266* 7.322* 7.343*  PCO2ART  --  56.5* 49.4* 44.1  PO2ART  --  86.0 117.0* 53.0*  HCO3  --  25.8 25.7 24.5  TCO2  --  27 27 26   O2SAT 78.7 95.0 98.0 89.0    CBC Recent Labs  Lab 03/08/19 1547 03/08/19 2334 03/09/19 0356   HGB 9.8* 9.5* 9.2*  HCT 31.5* 31.2* 29.9*  WBC 16.5* 15.2* 16.6*  PLT 153 150 158    COAGULATION Recent Labs  Lab 03/08/19 0313  INR 1.1    CARDIAC   Recent Labs  Lab 03/06/19 0301 03/07/19 0324 03/08/19 0313  TROPONINI 0.04* <0.03 <0.03   No results for input(s): PROBNP in the last 168 hours.   CHEMISTRY Recent Labs  Lab 03/05/19 0414  03/06/19 0301  03/07/19 0324 03/07/19 1820 03/08/19 0313 03/08/19 0427 03/08/19 1415 03/08/19 1547 03/09/19 0356  NA 134*   < > 135   < > 135 133* 136 134* 135 135 134*  K 4.2   < > 4.4   < > 4.3 4.4 3.9 3.9 3.8 3.9 4.2  CL 98   < > 102   < > 98 98 104  --   --  100 100  CO2 24   < > 20*   < > 24 23 22   --   --  26 22  GLUCOSE 130*   < > 192*   < > 181* 199* 153*  --   --  133* 108*  BUN 39*   < > 43*   < > 40* 39* 41*  --   --  30* 41*  CREATININE 2.33*   < > 2.48*   < > 1.95* 1.96* 1.81*  --   --  1.32* 2.06*  CALCIUM 9.0   < > 8.7*   < > 9.0 8.8* 8.7*  --   --  8.5* 8.3*  MG 2.8*  --  2.7*  --  2.7*  --  2.6*  --   --   --  2.6*  PHOS 4.2   < > 4.0   < > 3.8 3.2 2.6  --   --  1.7* 2.9   < > = values in this interval not displayed.   Estimated Creatinine Clearance: 38.1 mL/min (A) (by C-G formula based on SCr of 2.06 mg/dL (H)).   LIVER Recent Labs  Lab 03/07/19 0324 03/07/19 1820 03/08/19 0313 03/08/19 1547 03/09/19 0356  AST  --   --  26  --   --   ALT  --   --  29  --   --   ALKPHOS  --   --  112  --   --   BILITOT  --   --  0.6  --   --   PROT  --   --  6.5  --   --   ALBUMIN 2.8* 2.7* 2.7*  2.6* 2.6* 2.5*  INR  --   --  1.1  --   --      INFECTIOUS Recent Labs  Lab 03/08/19 0000  LATICACIDVEN 1.2     ENDOCRINE CBG (last 3)  Recent Labs    03/08/19 2324 03/09/19 0357 03/09/19 0826  GLUCAP 130* 111* 122*     Resolved Hospital Problem list   Cardiogenic shock, cor pulmonale  Ileus  Assessment & Plan:  48 year old with severe ARDS, multiorgan failure due to COVID-19   ASSESSMENT /  PLAN:  Severe ARDS due to COVID-19.  Associated with elevated d-dimer.  Heavy sedation, now paralysis.  He has been on nitric oxide as well, currently off.  PEEP, FiO2 needs improved since paralysis.  5/5 = day 14 ventilation.  P:   Continue to wean PEEP and FiO2 as able Plan to transition back to supine position this morning.  If well-tolerated and remains stable then will consider discontinuation of his paralytics IV heparin has been stopped due to hematochezia  Sedation needs for mechanical ventilation.  Requiring paralytics, deep sedation.  He has been on multiple enteral medications to supplement the IV regimen P:   Continue same, summarized below.  Hopefully will be able to stop Nimbex 5/5:  Dilaudid infusion, Versed infusion Supplemental meds: -Clonazepam 2 mg every 6 hours -Oxycodone 10 mg every 4 hours -Fentanyl patches 200 mcg every 72 hours -Seroquel 50 mg every 12 hours   COVID-19 infection MSSA bronchitis vs PNA vs colonization P:   Has received Actemra, hydroxychloroquine as above Convalescent plasma given 5/3 x 1 as per investigational protocol (consent from daughter by phone Dr. Chase Caller) Ceftriaxone restarted on 5/2 for MSSA on respiratory culture, plan 7 days total   Acute renal failure due to ATN, requiring CVVHD. Unclear at this time whether he will have renal recovery P:  Discussed with nephrology and the pharmacy.  There is a shortage of CVVH dialysate that may impact his care.  He may need to be changed to intermittent HD.  Based on this I do not believe we should transition to [place] [facility] at this time. He has not had any urine output, suspect that he will remain dialysis dependent long-term Continue volume removal as he can tolerate hemodynamically Avoid nephrotoxins Replace electrolytes as indicated  Hematochezia, started 5/4 while on heparin infusion Nutritional needs P:   Heparin infusion has been stopped Famotidine as ordered Follow CBC, stool output Tube  feeding at trickle  Hyperglycemia P:   Lantus, SSI as ordered  Facial pressure sores from prone positiong P: Padding in place, dressing on maxillary areas No clear indication to have [place] assess at this time  Best practice:  Diet: trickle TF Pain/Anxiety/Delirium protocol (if indicated): RASS goal -5, Dilaudid, Versed gtts VAP protocol (if indicated): Bundle in place. DVT prophylaxis: Heparin sq GI prophylaxis: Famotidine Glucose control: Lantus, SSI coverage. Mobility: Bedrest Code Status: Full Family Communication: Currently residing in [place].  Wife and daughter have been updated intermittently.  I was able to speak with his daughter via interpreter on 5/4 Disposition: ICU.  I am going to defer transitioning him to [facility] because I am concerned about the availability of CVVH dialysate, whether he will be able to continue continuous dialysis  Independent CC time 34 minutes  Baltazar Apo, MD, PhD 03/09/2019, 10:46 AM [facility] [phone] or if no answer [phone]

## 2019-03-09 NOTE — Progress Notes (Signed)
Nutrition Follow-up  DOCUMENTATION CODES:   Not applicable  INTERVENTION:    When able to resume TF, recommend start Vital High Protein at 30 ml/h, increase by 10 ml every 4 hours to goal rate of 60 ml/h (1440 ml per day)  Pro-stat 30 ml TID  Provides 1740 kcal, 171 gm protein, 1204 ml free water daily   Per ASPEN and SCCM guidelines, recommend continue gastric feedings during prone positioning, keeping HOB elevated (reverse trendelenburg) to at least 10-25 degrees.    NUTRITION DIAGNOSIS:   Inadequate oral intake related to inability to eat as evidenced by NPO status.  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 Admitted with fever, cough, AMS, and respiratory distress. COVID-19 positive.  Patient with severe ARDS and multiorgan failure due to COVID-19. S/P convalescent plasma transfusion on 5/3.  Patient being proned x 16 hours/d. He has been receiving trickle TF per MD due to paralyzation with Nimbex. Hopeful to d/c Nimbex today. OGT in place with tip in the stomach per abdominal film last night. TF currently on hold.   Remains on CRRT.  I/O -7 L since admission. Mild generalized and facial edema per RN documentation.  Patient remains intubated on ventilator support MV: 10.4 L/min Temp (24hrs), Avg:97.7 F (36.5 C), Min:96.9 F (36.1 C), Max:98.7 F (37.1 C)   Labs reviewed. Sodium 134 (L), BUN 41 (H), creatinine 2.06 (H), magnesium 2.6 (H), potassium & phosphorus WNL, corrected calcium 9.5 (WNL) CBG's: 111-122  Medications reviewed and include Colace, Novolog, Lantus, Senokot, Nimbex.   Diet Order:   Diet Order            Diet NPO time specified  Diet effective now              EDUCATION NEEDS:   No education needs have been  identified at this time  Skin:  Skin Assessment: Skin Integrity Issues: Skin Integrity Issues:: DTI, Stage II DTI: L and R side of face Stage II: nose  Last BM:  5/5 (type 6, small amount)  Height:   Ht Readings from Last 1 Encounters:  02/24/19 5\' 5"  (1.651 m)    Weight:   Wt Readings from Last 1 Encounters:  03/09/19 71 kg    Ideal Body Weight:  61.8 kg  BMI:  Body mass index is 26.05 kg/m.  Estimated Nutritional Needs:   Kcal:  3419  Protein:  169-184 gm  Fluid:  1.8 L    Molli Barrows, RD, LDN, [place] Pager [phone] After Hours Pager [phone]

## 2019-03-09 NOTE — Progress Notes (Signed)
Called and notified [facility] MD about pt being completely asynchronous with the vent despite continuous Nimbex infusion and sedation. RN's and RT at bedside had recently changed pt's head/arm position to face the L side. That was the only change that had been done. RN attempted to increase sedation and Nimbex per MD to make pt compliant with vent but no change. MD camera'd in at bedside and asked why we turn pt's and RN explained about prevention of pressure injuries. MD advised RN and RT to turn pt back to R side. Once pt back on R side, pt completely synchronous with the vent. Stat CXR ordered per MD. Will continue to monitor pt.

## 2019-03-09 NOTE — Progress Notes (Signed)
[facility] Progress Note Patient Name: Jonathan [name] DOB: 1971/02/12 MRN: [id number]   Date of Service  03/09/2019  HPI/Events of Note  Hypotension on CRRT. Ultrafiltration had to be discontinued.  eICU Interventions  25 % Albumin 50 gm iv x 1        Lamir Racca U Stavroula Rohde 03/09/2019, 9:20 PM

## 2019-03-09 NOTE — Progress Notes (Addendum)
[name] NEPHROLOGY PROGRESS NOTE  Assessment/ Plan: Pt is a 48 y.o. yo male  COVID, cardiogenic shock, hypoxia, VDRF, AKI on CRRT.  #Anuric AKI due to ATN, cardiogenic shock: On CRRT since 4/22, continue.  Discussed with ICU team, change UF goal 50-100 cc an hour.  Currently on 4K bath.  Heparin for anticoagulation, no issue with the filter.  Has IJ catheter. -prefilter reduce to 500 cc -check US renal  #COVID-19, ARDS, hypoxic respiratory failure on mechanical ventilation: Received plasma.  Per PCCM.  #Anemia in critical illness: Transfusion as needed.  #Encephalopathy  Subjective: Chart reviewed, discussed with ICU team.  No new event. Objective Vital signs in last 24 hours: Vitals:   03/09/19 0915 03/09/19 0930 03/09/19 0945 03/09/19 1000  BP: 121/79 118/81 108/77 107/72  Pulse: 84 81 82 80  Resp: (!) 30 (!) 30 (!) 30 (!) 30  Temp:      TempSrc:      SpO2: 100% 100% 100% 100%  Weight:      Height:       Weight change: 0.5 kg  Intake/Output Summary (Last 24 hours) at 03/09/2019 1009 Last data filed at 03/09/2019 1000 Gross per 24 hour  Intake 1417.93 ml  Output 1912 ml  Net -494.07 ml       Labs: Basic Metabolic Panel: Recent Labs  Lab 03/08/19 0313  03/08/19 1415 03/08/19 1547 03/09/19 0356  NA 136   < > 135 135 134*  K 3.9   < > 3.8 3.9 4.2  CL 104  --   --  100 100  CO2 22  --   --  26 22  GLUCOSE 153*  --   --  133* 108*  BUN 41*  --   --  30* 41*  CREATININE 1.81*  --   --  1.32* 2.06*  CALCIUM 8.7*  --   --  8.5* 8.3*  PHOS 2.6  --   --  1.7* 2.9   < > = values in this interval not displayed.   Liver Function Tests: Recent Labs  Lab 03/08/19 0313 03/08/19 1547 03/09/19 0356  AST 26  --   --   ALT 29  --   --   ALKPHOS 112  --   --   BILITOT 0.6  --   --   PROT 6.5  --   --   ALBUMIN 2.7*  2.6* 2.6* 2.5*   No results for input(s): LIPASE, AMYLASE in the last 168 hours. No results for input(s): AMMONIA in the last 168  hours. CBC: Recent Labs  Lab 03/03/19 0458  03/07/19 0324 03/08/19 0313  03/08/19 1547 03/08/19 2334 03/09/19 0356  WBC 16.6*   < > 19.7* 15.1*  --  16.5* 15.2* 16.6*  NEUTROABS 14.3*  --   --   --   --   --   --   --   HGB 11.9*   < > 11.2* 10.0*   < > 9.8* 9.5* 9.2*  HCT 38.9*   < > 36.0* 31.9*   < > 31.5* 31.2* 29.9*  MCV 95.3   < > 96.5 95.8  --  96.9 96.3 96.8  PLT 208   < > 185 157  --  153 150 158   < > = values in this interval not displayed.   Cardiac Enzymes: Recent Labs  Lab 03/06/19 0301 03/07/19 0324 03/08/19 0313  TROPONINI 0.04* <0.03 <0.03   CBG: Recent Labs  Lab 03/08/19 1535  03/08/19 2042 03/08/19 2324 03/09/19 0357 03/09/19 0826  GLUCAP 124* 164* 130* 111* 122*    Iron Studies:  Recent Labs    03/08/19 0313  FERRITIN 586*   Studies/Results: Dg Chest Port 1 View  Result Date: 03/09/2019 CLINICAL DATA:  Respiratory distress EXAM: PORTABLE CHEST 1 VIEW COMPARISON:  03/08/2019 FINDINGS: Support devices are stable. Patchy bilateral airspace disease is stable. No effusions. Heart is normal size. No acute bony abnormality. IMPRESSION: Stable bilateral airspace opacities. Electronically Signed   By: Rolm Baptise M.D.   On: 03/09/2019 01:32   Dg Chest Port 1 View  Result Date: 03/08/2019 CLINICAL DATA:  ARDS and coronavirus. EXAM: PORTABLE CHEST 1 VIEW COMPARISON:  Yesterday FINDINGS: Endotracheal tube tip at the clavicular heads. Bilateral central line with tip at the SVC. The orogastric tube tip reaches the stomach. Generalized airspace disease with patchy appearance that is stable. Normal heart size. No visible effusion or air leak. IMPRESSION: Stable hardware positioning and airspace disease. Electronically Signed   By: Monte Fantasia M.D.   On: 03/08/2019 05:33   Dg Chest Port 1 View  Result Date: 03/07/2019 CLINICAL DATA:  COVID-19 EXAM: PORTABLE CHEST 1 VIEW COMPARISON:  Portable exam 1403 hours compared to 03/06/2019 FINDINGS: Tip of endotracheal  tube projects 3.2 cm above carina. Nasogastric tube extends into abdomen. BILATERAL jugular lines with tip projecting over SVC. Normal heart size, mediastinal contours, and pulmonary vascularity. Atherosclerotic calcification aorta. Patchy BILATERAL airspace infiltrates consistent with pneumonia. Observed infiltrates are more confluent at both lung bases and in RIGHT upper lobe, and improved in LEFT upper lobe. No pleural effusion or pneumothorax. IMPRESSION: BILATERAL pulmonary infiltrates, more confluent at both lung bases and RIGHT upper lobe. Improved aeration LEFT upper lobe. Electronically Signed   By: Lavonia Dana M.D.   On: 03/07/2019 14:49   Dg Abd Portable 1v  Result Date: 03/09/2019 CLINICAL DATA:  OG tube placement EXAM: PORTABLE ABDOMEN - 1 VIEW COMPARISON:  None. FINDINGS: OG tube tip is in the distal stomach. Nonobstructive bowel gas pattern. IMPRESSION: OG tube tip in the distal stomach. Electronically Signed   By: Rolm Baptise M.D.   On: 03/09/2019 01:32    Medications: Infusions: .  prismasol BGK 4/2.5 1,000 mL/hr at 03/09/19 0501  .  prismasol BGK 4/2.5 300 mL/hr at 03/08/19 1632  . sodium chloride    . sodium chloride 10 mL/hr at 03/09/19 1000  . cefTRIAXone (ROCEPHIN)  IV Stopped (03/08/19 1310)  . cisatracurium (NIMBEX) infusion 4 mcg/kg/min (03/09/19 1000)  . feeding supplement (VITAL AF 1.2 CAL) Stopped (03/08/19 2354)  . heparin 10,000 units/ 20 mL infusion syringe 500 Units/hr (03/09/19 0900)  . HYDROmorphone 4 mg/hr (03/09/19 1000)  . midazolam 8 mg/hr (03/09/19 1007)  . prismasol BGK 4/2.5 1,500 mL/hr at 03/09/19 1610    Scheduled Medications: . sodium chloride   Intravenous Once  . artificial tears  1 application Both Eyes R6E  . chlorhexidine gluconate (MEDLINE KIT)  15 mL Mouth Rinse BID  . clonazePAM  2 mg Per Tube Q6H  . docusate  100 mg Per Tube BID  . famotidine  20 mg Per Tube Daily  . feeding supplement (PRO-STAT SUGAR FREE 64)  60 mL Per Tube TID  .  fentaNYL  2 patch Transdermal Q72H  . heparin injection (subcutaneous)  5,000 Units Subcutaneous Q8H  . insulin aspart  0-15 Units Subcutaneous Q4H  . insulin aspart  6 Units Subcutaneous Q4H  . insulin glargine  8 Units Subcutaneous Daily  .  mouth rinse  15 mL Mouth Rinse 10 times per day  . metoprolol tartrate  25 mg Oral BID  . oxyCODONE  10 mg Oral Q4H  . QUEtiapine  50 mg Oral BID  . sennosides  5 mL Oral BID    have reviewed scheduled and prn medications.   Dron Prasad Bhandari 03/09/2019,10:09 AM  LOS: 14 days

## 2019-03-09 NOTE — Progress Notes (Addendum)
[facility] Progress Note Patient Name: Jonathan [name] DOB: 1970/12/14 MRN: [id number]   Date of Service  03/09/2019  HPI/Events of Note  Hypotension persists despite albumin bolus  eICU Interventions  Begin Norepinephrine infusion, check H and H to exclude bleeding.        [name] 03/09/2019, 10:03 PM

## 2019-03-10 ENCOUNTER — Inpatient Hospital Stay (HOSPITAL_COMMUNITY): Payer: HRSA Program

## 2019-03-10 DIAGNOSIS — N179 Acute kidney failure, unspecified: Secondary | ICD-10-CM

## 2019-03-10 LAB — RENAL FUNCTION PANEL
Albumin: 2.9 g/dL — ABNORMAL LOW (ref 3.5–5.0)
Albumin: 3.3 g/dL — ABNORMAL LOW (ref 3.5–5.0)
Anion gap: 13 (ref 5–15)
Anion gap: 10 (ref 5–15)
BUN: 43 mg/dL — ABNORMAL HIGH (ref 6–20)
BUN: 53 mg/dL — ABNORMAL HIGH (ref 6–20)
CO2: 23 mmol/L (ref 22–32)
CO2: 24 mmol/L (ref 22–32)
Calcium: 8.9 mg/dL (ref 8.9–10.3)
Calcium: 9 mg/dL (ref 8.9–10.3)
Chloride: 99 mmol/L (ref 98–111)
Chloride: 101 mmol/L (ref 98–111)
Creatinine, Ser: 2.56 mg/dL — ABNORMAL HIGH (ref 0.61–1.24)
Creatinine, Ser: 2.99 mg/dL — ABNORMAL HIGH (ref 0.61–1.24)
GFR calc Af Amer: 33 mL/min — ABNORMAL LOW (ref >60)
GFR calc Af Amer: 27 mL/min — ABNORMAL LOW (ref >60)
GFR calc non Af Amer: 28 mL/min — ABNORMAL LOW (ref >60)
GFR calc non Af Amer: 24 mL/min — ABNORMAL LOW (ref >60)
Glucose, Bld: 183 mg/dL — ABNORMAL HIGH (ref 70–99)
Glucose, Bld: 137 mg/dL — ABNORMAL HIGH (ref 70–99)
Phosphorus: 2.1 mg/dL — ABNORMAL LOW (ref 2.5–4.6)
Phosphorus: 4.2 mg/dL (ref 2.5–4.6)
Potassium: 4.4 mmol/L (ref 3.5–5.1)
Potassium: 4.1 mmol/L (ref 3.5–5.1)
Sodium: 135 mmol/L (ref 135–145)
Sodium: 135 mmol/L (ref 135–145)

## 2019-03-10 LAB — GLUCOSE, CAPILLARY
Glucose-Capillary: 179 mg/dL — ABNORMAL HIGH (ref 70–99)
Glucose-Capillary: 149 mg/dL — ABNORMAL HIGH (ref 70–99)
Glucose-Capillary: 153 mg/dL — ABNORMAL HIGH (ref 70–99)
Glucose-Capillary: 114 mg/dL — ABNORMAL HIGH (ref 70–99)
Glucose-Capillary: 158 mg/dL — ABNORMAL HIGH (ref 70–99)

## 2019-03-10 LAB — CBC
HCT: 25.9 % — ABNORMAL LOW (ref 39.0–52.0)
Hemoglobin: 8 g/dL — ABNORMAL LOW (ref 13.0–17.0)
MCH: 30.3 pg (ref 26.0–34.0)
MCHC: 30.9 g/dL (ref 30.0–36.0)
MCV: 98.1 fL (ref 80.0–100.0)
Platelets: 134 10*3/uL — ABNORMAL LOW (ref 150–400)
RBC: 2.64 MIL/uL — ABNORMAL LOW (ref 4.22–5.81)
RDW: 16.7 % — ABNORMAL HIGH (ref 11.5–15.5)
WBC: 13 10*3/uL — ABNORMAL HIGH (ref 4.0–10.5)
nRBC: 0.9 % — ABNORMAL HIGH (ref 0.0–0.2)

## 2019-03-10 LAB — POCT I-STAT 7, (LYTES, BLD GAS, ICA,H+H)
Acid-Base Excess: 1 mmol/L (ref 0.0–2.0)
Bicarbonate: 26.3 mmol/L (ref 20.0–28.0)
Calcium, Ion: 1.26 mmol/L (ref 1.15–1.40)
HCT: 24 % — ABNORMAL LOW (ref 39.0–52.0)
Hemoglobin: 8.2 g/dL — ABNORMAL LOW (ref 13.0–17.0)
O2 Saturation: 91 %
Patient temperature: 97.8
Potassium: 4.1 mmol/L (ref 3.5–5.1)
Sodium: 136 mmol/L (ref 135–145)
TCO2: 28 mmol/L (ref 22–32)
pCO2 arterial: 46.1 mmHg (ref 32.0–48.0)
pH, Arterial: 7.361 (ref 7.350–7.450)
pO2, Arterial: 62 mmHg — ABNORMAL LOW (ref 83.0–108.0)

## 2019-03-10 LAB — MAGNESIUM: Magnesium: 2.6 mg/dL — ABNORMAL HIGH (ref 1.7–2.4)

## 2019-03-10 MED ORDER — SODIUM PHOSPHATES 45 MMOLE/15ML IV SOLN
10.0000 mmol | Freq: Once | INTRAVENOUS | Status: AC
  Administered 2019-03-10: 11:00:00 10 mmol via INTRAVENOUS
  Filled 2019-03-10: qty 3.33

## 2019-03-10 MED ORDER — VITAL AF 1.2 CAL PO LIQD
1000.0000 mL | ORAL | Status: DC
  Administered 2019-03-10 (×2): 1000 mL

## 2019-03-10 MED ORDER — ACETAMINOPHEN 160 MG/5ML PO SOLN
650.0000 mg | Freq: Four times a day (QID) | ORAL | Status: DC | PRN
  Administered 2019-03-10 – 2019-03-14 (×2): 650 mg
  Filled 2019-03-10 (×3): qty 20.3

## 2019-03-10 NOTE — Significant Event (Signed)
PCCM Interval note  I spoke with the patient's daughter Chrissie Noa by phone with the assistance of Spanish-speaking interpreter.  Explained his current status, describes some slight interval improvement in his ventilator needs, that he remains dialysis dependent and that I suspect there is high likelihood that he will require dialysis long-term.  Also explained that the patient will be transferred to [facility] today to continue his ICU care.  All questions answered  Baltazar Apo, MD, PhD 03/10/2019, 1:17 PM  Pulmonary and Critical Care [phone] or if no answer [phone]

## 2019-03-10 NOTE — Progress Notes (Signed)
Pt repositioned from prone to back to side. Tolerated very well. Will continue to monitor.

## 2019-03-10 NOTE — Progress Notes (Addendum)
PCCM progress note  Patient transferred from [facility] M[facility] to [place] via teleICU and discussed with nursing staff Blood pressure (!) 93/53, pulse (!) 113, temperature 97.8 F (36.6 C), temperature source Axillary, resp. rate (!) 39, height 5\' 5"  (1.651 m), weight 69 kg, SpO2 95 %. Gen:      No acute distress HEENT:  ETT CV:         Regular rate and rhythm Ext:    No edema; adequate peripheral perfusion Neuro: Sedated, unresponsive  Assessment/plan 48 year old with severe ARDS secondary to COVID  Vitals are stable. Continue ARDS net ventilation, wean down PEEP/FiO2 Follow chest x-ray, ABG We will restart CVVH today Code status is limited code.  Made 2 attending attested partial code with no CPR on 4/28 Rest of plan as per rounding note from today morning.  Marshell Garfinkel MD [place] [facility] Pager [phone] If no answer call [phone] 03/10/2019, 5:25 PM

## 2019-03-10 NOTE — TOC Progression Note (Signed)
Transition of Care [facility]) - Progression Note    Patient Details  Name: Dior Stepter MRN: [id number] Date of Birth: [birth year]  Transition of Care [facility]) CM/SW Contact  Romaine Neville, Abelino Derrick, RN Phone Number: 03/10/2019, 2:11 PM  Clinical Narrative: Pt remains on vent and CRRT - pt will transfer to [facility] once CRRT machine/1:1 staff  is available.  CM spoke with daughter Chrissie Noa with the assistance on interpretor via phone.  CM explained that call was to help family prepare for the best scenerio with pt returning home, attending also provided medical update during phone call.  Daughter informed CM; pt will have transportation to appt in [place] however pt does not currently have a PCP (he does see a doctor however rarely for DM).  Daughter informed CM that the nearest doctor is approximately 1 hour drive from home. Pts home zipcode is [phone], city is [place] (CM unable to get actual street address).  CM will provide information to HD coordinator.     Expected Discharge Plan: (CM unsure at this time of dispostion as pt remains critically ill) Barriers to Discharge: Continued Medical Work up(COVID positive on CRRT)  Expected Discharge Plan and Services Expected Discharge Plan: (CM unsure at this time of dispostion as pt remains critically ill)       Living arrangements for the past 2 months: Single Family Home                                       Social Determinants of Health (SDOH) Interventions    Readmission Risk Interventions No flowsheet data found.

## 2019-03-10 NOTE — Progress Notes (Signed)
[facility] NEPHROLOGY PROGRESS NOTE  Assessment/ Plan: Pt is a 48 y.o. yo male  COVID, cardiogenic shock, hypoxia, VDRF, AKI on CRRT.  #Anuric AKI due to ATN, cardiogenic shock: On CRRT since 4/22, continue.  Discussed with ICU team,  UF goal 50-100 cc an hour.  Currently on 4K bath.  Heparin for anticoagulation, no issue with the filter.  Has IJ catheter. -prefilter reduce to 500 cc -US renal with no hydronephrosis. -No sign of renal recovery.  I think patient will need renal replacement therapy, continue CRRT.  #COVID-19, ARDS, hypoxic respiratory failure on mechanical ventilation: Received plasma.  Per PCCM.  #Anemia in critical illness: Transfusion as needed.  #Encephalopathy  Subjective: Chart reviewed, discussed with ICU team.  Tolerating CRRT. Objective Vital signs in last 24 hours: Vitals:   03/10/19 0700 03/10/19 0800 03/10/19 0854 03/10/19 0900  BP: 110/71 117/69  121/77  Pulse: 72 74 76 85  Resp: (!) 30 (!) 30 (!) 30 (!) 27  Temp:  97.9 F (36.6 C)    TempSrc:  Axillary    SpO2: 99% 100% 100% 100%  Weight:      Height:       Weight change: -2 kg  Intake/Output Summary (Last 24 hours) at 03/10/2019 1021 Last data filed at 03/10/2019 0910 Gross per 24 hour  Intake 2012.43 ml  Output 1612 ml  Net 400.43 ml       Labs: Basic Metabolic Panel: Recent Labs  Lab 03/09/19 0356 03/09/19 1600 03/10/19 0400  NA 134* 134* 135  K 4.2 4.3 4.4  CL 100 100 99  CO2 '22 23 23  ' GLUCOSE 108* 137* 183*  BUN 41* 43* 43*  CREATININE 2.06* 2.15* 2.56*  CALCIUM 8.3* 8.6* 8.9  PHOS 2.9 2.5 2.1*   Liver Function Tests: Recent Labs  Lab 03/08/19 0313  03/09/19 0356 03/09/19 1600 03/10/19 0400  AST 26  --   --   --   --   ALT 29  --   --   --   --   ALKPHOS 112  --   --   --   --   BILITOT 0.6  --   --   --   --   PROT 6.5  --   --   --   --   ALBUMIN 2.7*  2.6*   < > 2.5* 2.5* 2.9*   < > = values in this interval not displayed.   No results for  input(s): LIPASE, AMYLASE in the last 168 hours. No results for input(s): AMMONIA in the last 168 hours. CBC: Recent Labs  Lab 03/08/19 0313  03/08/19 1547 03/08/19 2334 03/09/19 0356 03/09/19 2208 03/10/19 0400  WBC 15.1*  --  16.5* 15.2* 16.6*  --  13.0*  HGB 10.0*   < > 9.8* 9.5* 9.2* 8.1* 8.0*  HCT 31.9*   < > 31.5* 31.2* 29.9* 26.2* 25.9*  MCV 95.8  --  96.9 96.3 96.8  --  98.1  PLT 157  --  153 150 158  --  134*   < > = values in this interval not displayed.   Cardiac Enzymes: Recent Labs  Lab 03/06/19 0301 03/07/19 0324 03/08/19 0313  TROPONINI 0.04* <0.03 <0.03   CBG: Recent Labs  Lab 03/09/19 1558 03/09/19 2009 03/09/19 2312 03/10/19 0355 03/10/19 0754  GLUCAP 124* 115* 141* 179* 149*    Iron Studies:  Recent Labs    03/08/19 0313  FERRITIN 586*   Studies/Results: US Renal  Result Date: 03/09/2019 CLINICAL DATA:  Patient with acute renal failure. EXAM: RENAL / URINARY TRACT ULTRASOUND COMPLETE COMPARISON:  None. FINDINGS: Right Kidney: Renal measurements: 13.3 x 5.6 x 7.1 cm = volume: 279.4 mL . Echogenicity within normal limits. No mass or hydronephrosis visualized. Left Kidney: Renal measurements: 12.4 x 6.2 x 6.1 cm = volume: 242.4 mL. Echogenicity within normal limits. No mass or hydronephrosis visualized. Bladder: Appears normal for degree of bladder distention. Incidental note is made echogenic liver suggestive of steatosis. Sludge in the gallbladder lumen. IMPRESSION: No hydronephrosis. Electronically Signed   By: Lovey Newcomer M.D.   On: 03/09/2019 13:23   Dg Chest Port 1 View  Result Date: 03/10/2019 CLINICAL DATA:  Respiratory failure. EXAM: PORTABLE CHEST 1 VIEW COMPARISON:  03/09/2019 FINDINGS: Endotracheal tube remains with the tip approximately 3 cm above the carina. Right jugular central line and left jugular non tunneled dialysis catheter demonstrates stable positioning. Gastric decompression tube continues to extend into the stomach. Bilateral  pulmonary airspace disease relatively stable and lung volumes are stable. There may be slightly increased opacity of airspace disease in the retrocardiac left lower lobe. No pneumothorax or significant pleural effusions. IMPRESSION: Suggestion of increased density of airspace disease in the left lower lobe. Electronically Signed   By: Aletta Edouard M.D.   On: 03/10/2019 07:51   Dg Chest Port 1 View  Result Date: 03/09/2019 CLINICAL DATA:  Respiratory distress EXAM: PORTABLE CHEST 1 VIEW COMPARISON:  03/08/2019 FINDINGS: Support devices are stable. Patchy bilateral airspace disease is stable. No effusions. Heart is normal size. No acute bony abnormality. IMPRESSION: Stable bilateral airspace opacities. Electronically Signed   By: Rolm Baptise M.D.   On: 03/09/2019 01:32   Dg Abd Portable 1v  Result Date: 03/09/2019 CLINICAL DATA:  OG tube placement EXAM: PORTABLE ABDOMEN - 1 VIEW COMPARISON:  None. FINDINGS: OG tube tip is in the distal stomach. Nonobstructive bowel gas pattern. IMPRESSION: OG tube tip in the distal stomach. Electronically Signed   By: Rolm Baptise M.D.   On: 03/09/2019 01:32    Medications: Infusions: .  prismasol BGK 4/2.5 500 mL/hr at 03/10/19 0801  .  prismasol BGK 4/2.5 300 mL/hr at 03/10/19 0917  . sodium chloride    . sodium chloride 10 mL/hr at 03/10/19 0900  . cefTRIAXone (ROCEPHIN)  IV 2 g (03/09/19 1300)  . feeding supplement (VITAL AF 1.2 CAL)    . heparin 10,000 units/ 20 mL infusion syringe 500 Units/hr (03/10/19 0900)  . HYDROmorphone 4 mg/hr (03/10/19 0900)  . midazolam 6 mg/hr (03/10/19 0900)  . norepinephrine (LEVOPHED) Adult infusion Stopped (03/10/19 0825)  . prismasol BGK 4/2.5 1,500 mL/hr at 03/10/19 0920  . sodium phosphate  Dextrose 5% IVPB      Scheduled Medications: . sodium chloride   Intravenous Once  . chlorhexidine gluconate (MEDLINE KIT)  15 mL Mouth Rinse BID  . clonazePAM  2 mg Per Tube Q6H  . docusate  100 mg Per Tube BID  . famotidine   20 mg Per Tube Daily  . feeding supplement (PRO-STAT SUGAR FREE 64)  60 mL Per Tube TID  . heparin injection (subcutaneous)  5,000 Units Subcutaneous Q8H  . insulin aspart  0-15 Units Subcutaneous Q4H  . insulin aspart  6 Units Subcutaneous Q4H  . insulin glargine  8 Units Subcutaneous Daily  . mouth rinse  15 mL Mouth Rinse 10 times per day  . oxyCODONE  10 mg Oral Q4H  . QUEtiapine  50 mg Oral  BID  . sennosides  5 mL Oral BID    have reviewed scheduled and prn medications.   Dvonte Gatliff Prasad Darcy Barbara 03/10/2019,10:21 AM  LOS: 15 days

## 2019-03-10 NOTE — Progress Notes (Signed)
NAME:  Jonathan [name], MRN:  [id number], DOB:  03-12-1971, LOS: 66 ADMISSION DATE:  02/23/2019, CONSULTATION DATE:  02/23/2019 REFERRING MD:  Lianne [facility] ED, CHIEF COMPLAINT:  Respiratory failure   Brief History   48 year old man intubated for severe respiratory failure requiring prone ventilation and nitric oxide for ARDS related to COVID-19. 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   Past Medical History  T2DM recently diagnosed on OHA.   Significant Hospital Events   4/21 admitted and immediately proned on inverse ratio PC ventilation with high PEEP to maintain saturation >85%, Started on NO at 40ppm. 4/22- Tocluzimab X 1 4/21-4/24 Proned daily 4/28 -4/30 proned daily , tapered NO to off  4/30 -late evening with increased O2 demands . No paralytics required x 24hr . No vent dysyncrony . Calm on Fent /Versed. Off precedex   5/1 - Remains on CRRT , no acute issues overnight --4.8 L I/O Balance since admit . Continues to Prone on/off 16 on and 8 off . -CXR stable diffuse bilateral infiltrates (slightly improved aeration ) . , Vent .80Fio2 .   Biomarkes improving but severe ARDS persists ad D-dimer still very high (13) without improvement. On IV Heparin gtt. Growing MSSA. SEdation changed to dilaudid gtt. Continues prone 16h and 8h syupine. Currently supine at 80% fio2, peep 18. On TF. STARTED NIMBEX  5/4-5/5 -able to stay in the supine position, able to discontinue paralysis and maintain FiO2 0.50, PEEP 8-10.    Consults:  PCCM 4/21  Procedures:  4/21 - ETT 7.5 >> 4/21 - RIJ CVC [facility] >>  Significant Diagnostic Tests:  POC U/S 4/21: Bilateral interstitial pattern with bilateral dense consolidation at bases. Echo shows low-normal LVSF. RV moderately dilated with septal shift.   Micro Data:  Blood cultures -PND SARS-CoV2 02/23/2019 - - POSITIVE HIV 4/21 -neg Quant  gold 4/2 1- neg .....................Marland Kitchen resp 4/29 >>rare MSSA and yeast   Anti-covid RX and antimicrobial RX  Azithromycin 4/21 >>4/25 Ceftriaxone 4/22 >>4/25 Actemra 4/22 Hydroxychloroquine 4/21 > 4/22 ........................... Ceftriaxone 5/2 >>   Interim history/subjective:  No further blood loss noted off heparin infusion Stayed in the supine position overnight, paralysis lifted 5/5. Current vent settings FiO2 0.50, PEEP 8 Transient hypotension overnight, UF was temporarily stopped.  Norepinephrine started, currently at 8 and weaning   Objective   Blood pressure 117/69, pulse 76, temperature 97.9 F (36.6 C), temperature source Axillary, resp. rate (!) 30, height 5\' 5"  (1.651 m), weight 69 kg, SpO2 100 %. CVP:  [7 mmHg] 7 mmHg  Vent Mode: PRVC FiO2 (%):  [40 %-50 %] 40 % Set Rate:  [30 bmp] 30 bmp Vt Set:  [360 mL] 360 mL PEEP:  [8 cmH20-10 cmH20] 8 cmH20 Plateau Pressure:  [20 cmH20-23 cmH20] 20 cmH20   Intake/Output Summary (Last 24 hours) at 03/10/2019 0856 Last data filed at 03/10/2019 0800 Gross per 24 hour  Intake 1946 ml  Output 1650 ml  Net 296 ml   Filed Weights   03/08/19 0448 03/09/19 0500 03/10/19 0500  Weight: 70.5 kg 71 kg 69 kg     General Appearance: Acutely ill, mechanically ventilated, supine position HEENT: ET tube in place, very soft cuff leak that resolves with increased Balloon pressure. Neck: No lymphadenopathy Lungs: More clear today, few bibasilar inspiratory crackles, focal inspiratory squeak on the right. Pplat improved to 15 Heart: Regular, no murmur.  Norepinephrine  8, being weaned Abdomen: Soft, nondistended with positive bowel sounds Genitalia / Rectal:  Not examined Extremities: No lower extremity edema Skin: Dressings on his bilateral maxillary areas from pressure injury Neurologic: Deep sedation, unresponsive to pain, voice.  Paralysis lifted   LABS    PULMONARY Recent Labs  Lab 03/06/19 0112 03/08/19 0427 03/08/19 1415   PHART 7.266* 7.322* 7.343*  PCO2ART 56.5* 49.4* 44.1  PO2ART 86.0 117.0* 53.0*  HCO3 25.8 25.7 24.5  TCO2 27 27 26   O2SAT 95.0 98.0 89.0    CBC Recent Labs  Lab 03/08/19 2334 03/09/19 0356 03/09/19 2208 03/10/19 0400  HGB 9.5* 9.2* 8.1* 8.0*  HCT 31.2* 29.9* 26.2* 25.9*  WBC 15.2* 16.6*  --  13.0*  PLT 150 158  --  134*    COAGULATION Recent Labs  Lab 03/08/19 0313  INR 1.1    CARDIAC   Recent Labs  Lab 03/06/19 0301 03/07/19 0324 03/08/19 0313  TROPONINI 0.04* <0.03 <0.03   No results for input(s): PROBNP in the last 168 hours.   CHEMISTRY Recent Labs  Lab 03/06/19 0301  03/07/19 0324  03/08/19 0313  03/08/19 1415 03/08/19 1547 03/09/19 0356 03/09/19 1600 03/10/19 0400  NA 135   < > 135   < > 136   < > 135 135 134* 134* 135  K 4.4   < > 4.3   < > 3.9   < > 3.8 3.9 4.2 4.3 4.4  CL 102   < > 98   < > 104  --   --  100 100 100 99  CO2 20*   < > 24   < > 22  --   --  26 22 23 23   GLUCOSE 192*   < > 181*   < > 153*  --   --  133* 108* 137* 183*  BUN 43*   < > 40*   < > 41*  --   --  30* 41* 43* 43*  CREATININE 2.48*   < > 1.95*   < > 1.81*  --   --  1.32* 2.06* 2.15* 2.56*  CALCIUM 8.7*   < > 9.0   < > 8.7*  --   --  8.5* 8.3* 8.6* 8.9  MG 2.7*  --  2.7*  --  2.6*  --   --   --  2.6*  --  2.6*  PHOS 4.0   < > 3.8   < > 2.6  --   --  1.7* 2.9 2.5 2.1*   < > = values in this interval not displayed.   Estimated Creatinine Clearance: 30.7 mL/min (A) (by C-G formula based on SCr of 2.56 mg/dL (H)).   LIVER Recent Labs  Lab 03/08/19 0313 03/08/19 1547 03/09/19 0356 03/09/19 1600 03/10/19 0400  AST 26  --   --   --   --   ALT 29  --   --   --   --   ALKPHOS 112  --   --   --   --   BILITOT 0.6  --   --   --   --   PROT 6.5  --   --   --   --   ALBUMIN 2.7*   2.6* 2.6* 2.5* 2.5* 2.9*  INR 1.1  --   --   --   --      INFECTIOUS Recent Labs  Lab 03/08/19 0000  LATICACIDVEN 1.2     ENDOCRINE  CBG (last 3)  Recent Labs    03/09/19 2312  03/10/19 0355 03/10/19 0754  GLUCAP 141* 179* 149*     Resolved Hospital Problem list   Cardiogenic shock, cor pulmonale  Ileus   Assessment & Plan:  48 year old with severe ARDS, multiorgan failure due to COVID-19   ASSESSMENT / PLAN:  Severe ARDS due to COVID-19.  Associated with elevated d-dimer.  Heavy sedation.  He has been on nitric oxide early in course, currently off.  PEEP, FiO2 needs improved since paralysis; now able to lift Nimbex and keep supine.  5/6 = day 15 ventilation.  P:   ARDS protocol in place.  PEEP of 8, FiO2 0.50 Keep him in the supine position as long as he continues to make ventilator progress Paralysis discontinued.  Begin the process of slowly decreasing sedative IV heparin stopped due to hematochezia, now resolved Suspect he will require tracheostomy.  Sedation needs for mechanical ventilation.  Requiring deep sedation.  He has been on multiple enteral medications to supplement the IV regimen P:   Sedation as summarized below.  Paralytics stopped 5/5.  Plan to remove fentanyl patches today 5/6, slowly decrease IV sedation Dilaudid infusion, Versed infusion Supplemental meds: -Clonazepam 2 mg every 6 hours -Oxycodone 10 mg every 4 hours -Seroquel 50 mg every 12 hours   COVID-19 infection MSSA bronchitis vs PNA vs colonization P:   Patient received Actemra, hydroxychloroquine as above Convalescent plasma given 5/3 x 1 as per investigational protocol (consent from daughter by phone Dr. Chase Caller) Continue ceftriaxone, restarted on 5/2 for MSSA on respiratory culture, plan 7 days total  Shock.  Multifactorial with contributions from sedation, intrathoracic pressures, volume removal/UF P: Continue to wean norepinephrine, goal to off Planning to start slowly decreasing sedating medications, should help blood pressure PEEP decreased to 8 overnight, appears to have helped BP as well Continue to follow for any evidence of GI blood loss, none  currently Plan to discontinue scheduled metoprolol (started 4/29 for transient tachycardia)   Acute renal failure due to ATN, requiring CVVHD. Unclear at this time whether he will have renal recovery P:  Continue CVVH.  Doubt that he will have a renal recovery and ultimately he would need IHD if he survives. Discussed current care with nephrology and pharmacy.  There was concern that CVVH dialysate would not continue to be available and he might need to change to IHD now.  Apparently this is no longer the case.  Based on that we can likely transition him to [facility].  As above he would likely need to come back to [facility] after recovery from his critical care illness to get IHD. Avoid nephrotoxins Replace electrolytes as indicated  Hematochezia, started 5/4 while on heparin infusion Nutritional needs P:   Heparin infusion stopped famotidine as ordered Follow CBC, stool output  Tube feeding at trickle, can likely advance now that he is no longer paralyzed and proning  Hyperglycemia P:   Lantus, SSI as ordered  Facial pressure sores from prone positiong P: Padding and dressing on bilateral maxillary areas No clear indication for WOC intervention  Best practice:  Diet: trickle TF, try to advance 5/6 Pain/Anxiety/Delirium protocol (if indicated): RASS goal -3, Dilaudid, Versed gtts, supplements VAP protocol (if indicated): Bundle in place. DVT prophylaxis: Heparin sq GI prophylaxis: Famotidine Glucose control: Lantus, SSI coverage. Mobility: Bedrest Code Status: Full Family Communication: Currently residing in [place].  Wife and daughter have been updated intermittently.  I was able to speak with his daughter via  interpreter on 5/4 Disposition: ICU.  Should be able to transfer to [facility] on 5/6.  I will work on arranging  Independent CC time 71 minutes  Baltazar Apo, MD, PhD 03/10/2019, 8:56 AM [facility] [phone] or if no answer [phone]

## 2019-03-10 NOTE — Progress Notes (Signed)
Pt's home address in [place] is [address] (street name), [place] (city), [place] state), [phone] (zip).

## 2019-03-10 NOTE — Consult Note (Signed)
[place] Nurse wound consult note Patient receiving care in [facility] 2M11.  I was able to perform the facial wound consult via remote camera in the room.  The primary RN, Caryl Pina, and a RT were present and able to move the ETT stablizer for me to view the areas. Reason for Consult: Unstageable wounds to bilateral cheeks.  Management of these areas. Wound type: HAPIs that started as  Pressure Injury POA:No Measurement: To be measured by the primary RN. Wound bed: black and moist Drainage (amount, consistency, odor) present on existing foam dressing-serosanginous Periwound: intact Dressing procedure/placement/frequency:Cleanse bilateral cheek wounds with saline. Pat dry. Apply a piece of hydrocollid dressing Kellie Simmering [phone]) over each wound. Change daily. Monitor the wound area(s) for worsening of condition such as: Signs/symptoms of infection,  Increase in size,  Development of or worsening of odor, Development of pain, or increased pain at the affected locations.  Notify the medical team if any of these develop.  Thank you for the consult.  Discussed plan of care with the bedside nurse.  [place] nurse will not follow at this time.  Please re-consult the [facility] team if needed.  Val Riles, RN, MSN, CWOCN, CNS-BC, pager [phone]

## 2019-03-10 NOTE — Progress Notes (Signed)
30 mL IV Versed wasted in sink. Witnessed by Lawerance Bach, Therapist, sports.

## 2019-03-11 ENCOUNTER — Inpatient Hospital Stay (HOSPITAL_COMMUNITY): Payer: HRSA Program

## 2019-03-11 LAB — RENAL FUNCTION PANEL
Albumin: 3.3 g/dL — ABNORMAL LOW (ref 3.5–5.0)
Anion gap: 10 (ref 5–15)
BUN: 51 mg/dL — ABNORMAL HIGH (ref 6–20)
CO2: 24 mmol/L (ref 22–32)
Calcium: 9 mg/dL (ref 8.9–10.3)
Chloride: 101 mmol/L (ref 98–111)
Creatinine, Ser: 2.69 mg/dL — ABNORMAL HIGH (ref 0.61–1.24)
GFR calc Af Amer: 31 mL/min — ABNORMAL LOW (ref >60)
GFR calc non Af Amer: 27 mL/min — ABNORMAL LOW (ref >60)
Glucose, Bld: 129 mg/dL — ABNORMAL HIGH (ref 70–99)
Phosphorus: 3.6 mg/dL (ref 2.5–4.6)
Potassium: 4 mmol/L (ref 3.5–5.1)
Sodium: 135 mmol/L (ref 135–145)

## 2019-03-11 LAB — POCT I-STAT 7, (LYTES, BLD GAS, ICA,H+H)
Acid-base deficit: 1 mmol/L (ref 0.0–2.0)
Bicarbonate: 25.2 mmol/L (ref 20.0–28.0)
Calcium, Ion: 1.28 mmol/L (ref 1.15–1.40)
HCT: 24 % — ABNORMAL LOW (ref 39.0–52.0)
Hemoglobin: 8.2 g/dL — ABNORMAL LOW (ref 13.0–17.0)
O2 Saturation: 91 %
Patient temperature: 99.4
Potassium: 4.1 mmol/L (ref 3.5–5.1)
Sodium: 134 mmol/L — ABNORMAL LOW (ref 135–145)
TCO2: 27 mmol/L (ref 22–32)
pCO2 arterial: 47.7 mmHg (ref 32.0–48.0)
pH, Arterial: 7.334 — ABNORMAL LOW (ref 7.350–7.450)
pO2, Arterial: 67 mmHg — ABNORMAL LOW (ref 83.0–108.0)

## 2019-03-11 LAB — MAGNESIUM: Magnesium: 2.8 mg/dL — ABNORMAL HIGH (ref 1.7–2.4)

## 2019-03-11 LAB — CBC
HCT: 27.2 % — ABNORMAL LOW (ref 39.0–52.0)
Hemoglobin: 8.2 g/dL — ABNORMAL LOW (ref 13.0–17.0)
MCH: 29.6 pg (ref 26.0–34.0)
MCHC: 30.1 g/dL (ref 30.0–36.0)
MCV: 98.2 fL (ref 80.0–100.0)
Platelets: 167 10*3/uL (ref 150–400)
RBC: 2.77 MIL/uL — ABNORMAL LOW (ref 4.22–5.81)
RDW: 17.2 % — ABNORMAL HIGH (ref 11.5–15.5)
WBC: 14.6 10*3/uL — ABNORMAL HIGH (ref 4.0–10.5)
nRBC: 0.6 % — ABNORMAL HIGH (ref 0.0–0.2)

## 2019-03-11 LAB — GLUCOSE, CAPILLARY
Glucose-Capillary: 144 mg/dL — ABNORMAL HIGH (ref 70–99)
Glucose-Capillary: 121 mg/dL — ABNORMAL HIGH (ref 70–99)
Glucose-Capillary: 128 mg/dL — ABNORMAL HIGH (ref 70–99)
Glucose-Capillary: 131 mg/dL — ABNORMAL HIGH (ref 70–99)
Glucose-Capillary: 193 mg/dL — ABNORMAL HIGH (ref 70–99)
Glucose-Capillary: 187 mg/dL — ABNORMAL HIGH (ref 70–99)
Glucose-Capillary: 199 mg/dL — ABNORMAL HIGH (ref 70–99)

## 2019-03-11 LAB — PHOSPHORUS: Phosphorus: 3.6 mg/dL (ref 2.5–4.6)

## 2019-03-11 MED ORDER — HEPARIN SODIUM (PORCINE) 10000 UNIT/ML IJ SOLN
7500.0000 [IU] | Freq: Three times a day (TID) | INTRAMUSCULAR | Status: DC
  Filled 2019-03-11: qty 1

## 2019-03-11 MED ORDER — POLYETHYLENE GLYCOL 3350 17 G PO PACK
17.0000 g | PACK | Freq: Every day | ORAL | Status: DC | PRN
  Administered 2019-03-19: 10:00:00 17 g via ORAL
  Filled 2019-03-11 (×2): qty 1

## 2019-03-11 MED ORDER — HEPARIN SODIUM (PORCINE) 5000 UNIT/ML IJ SOLN
7500.0000 [IU] | Freq: Three times a day (TID) | INTRAMUSCULAR | Status: DC
  Administered 2019-03-11 – 2019-03-14 (×9): 7500 [IU] via SUBCUTANEOUS
  Filled 2019-03-11 (×9): qty 2

## 2019-03-11 MED ORDER — MIDAZOLAM HCL 2 MG/2ML IJ SOLN
2.0000 mg | INTRAMUSCULAR | Status: DC | PRN
  Administered 2019-03-13 – 2019-03-16 (×3): 2 mg via INTRAVENOUS
  Filled 2019-03-11 (×8): qty 2

## 2019-03-11 MED ORDER — SODIUM CHLORIDE 0.9 % IV SOLN
0.5000 mg/h | INTRAVENOUS | Status: DC
  Administered 2019-03-11: 0.5 mg/h via INTRAVENOUS
  Administered 2019-03-13: 1.5 mg/h via INTRAVENOUS
  Administered 2019-03-14: 22:00:00 3.5 mg/h via INTRAVENOUS
  Administered 2019-03-16 – 2019-03-18 (×2): 0.5 mg/h via INTRAVENOUS
  Filled 2019-03-11 (×5): qty 5

## 2019-03-11 MED ORDER — HEPARIN SODIUM (PORCINE) 10000 UNIT/ML IJ SOLN: 7500.0000 [IU] | Freq: Three times a day (TID) | INTRAMUSCULAR | Status: DC

## 2019-03-11 MED ORDER — VITAL AF 1.2 CAL PO LIQD
1000.0000 mL | ORAL | Status: DC
  Administered 2019-03-11 – 2019-03-16 (×5): 1000 mL

## 2019-03-11 MED ORDER — HYDROMORPHONE BOLUS VIA INFUSION
0.5000 mg | INTRAVENOUS | Status: DC | PRN
  Administered 2019-03-11 – 2019-03-18 (×20): 0.5 mg via INTRAVENOUS
  Filled 2019-03-11: qty 1

## 2019-03-11 MED ORDER — PANTOPRAZOLE SODIUM 40 MG PO PACK
40.0000 mg | PACK | Freq: Two times a day (BID) | ORAL | Status: DC
  Administered 2019-03-11 – 2019-03-12 (×3): 40 mg
  Filled 2019-03-11 (×3): qty 20

## 2019-03-11 MED ORDER — HYDROMORPHONE HCL 1 MG/ML IJ SOLN: 1.0000 mg | Freq: Once | INTRAMUSCULAR | Status: DC

## 2019-03-11 MED ORDER — MIDAZOLAM HCL 2 MG/2ML IJ SOLN
2.0000 mg | INTRAMUSCULAR | Status: DC | PRN
  Administered 2019-03-12 – 2019-03-19 (×17): 2 mg via INTRAVENOUS
  Filled 2019-03-11 (×17): qty 2

## 2019-03-11 MED ORDER — CLONAZEPAM 1 MG PO TABS
2.0000 mg | ORAL_TABLET | Freq: Two times a day (BID) | ORAL | Status: DC
  Administered 2019-03-11 – 2019-03-12 (×2): 2 mg
  Filled 2019-03-11 (×2): qty 2

## 2019-03-11 NOTE — Consult Note (Signed)
[place] Nurse wound consult note I received a Secure Chat back from primary RN, Elyn Peers.  She will ask for the supplies to be ordered so she can place the hydrocolloid dressings to the facial wounds.  They currently have foam dressing on them.  Val Riles, RN, MSN, CWOCN, CNS-BC, pager [phone]

## 2019-03-11 NOTE — Progress Notes (Signed)
Nutrition Follow-up RD working remotely.  DOCUMENTATION CODES:   Not applicable  INTERVENTION:    Increase Vital AF 1.2 by 10 ml every 4 hours to goal rate of 40 ml/h (960 ml per day)  Pro-stat 60 ml TID  Provides 1752 kcal, 162 gm protein, 779 ml free water daily  NUTRITION DIAGNOSIS:   Inadequate oral intake related to inability to eat as evidenced by NPO status.  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 Admitted with fever, cough, AMS, and respiratory distress. COVID-19 positive. S/P convalescent plasma transfusion on 5/3. Transferred to [place] on 5/6.  Patient was transferred to [place] yesterday. CRRT ongoing. Currently supine. Nimbex has been discontinued.   Receiving trickle TF at 20 ml/h via OGT. Patient is tolerating this well, but current regimen is not meeting nutrition needs.  Remains intubated on ventilator support MV: 11.5 L/min Temp (24hrs), Avg:98.9 F (37.2 C), Min:98.7 F (37.1 C), Max:99 F (37.2 C)   Labs reviewed. Sodium 134 (L), potassium 4.1 (WNL), phosphorus 3.6 (WNL) CBG's: 121-128 Medications reviewed and include Colace, Novolog, Lantus.   I/O net -8 L  Weight down 5.6 kg since admission Mild generalized edema and mild facial edema present per RN documentation.  NUTRITION - FOCUSED PHYSICAL EXAM:  unable to complete-working remotely  Diet Order:   Diet Order            Diet NPO time specified  Diet effective now              EDUCATION NEEDS:   No education needs have been identified at this time  Skin:  Skin Assessment: Skin Integrity Issues: Skin Integrity Issues:: DTI, Stage II DTI: L and R side of face Stage II: nose  Last BM:  5/5 (type 6, small amount)  Height:   Ht Readings from Last 1 Encounters:   03/10/19 5\' 5"  (1.651 m)    Weight:   Wt Readings from Last 1 Encounters:  03/11/19 72.1 kg    Ideal Body Weight:  61.8 kg  BMI:  Body mass index is 26.45 kg/m.  Estimated Nutritional Needs:   Kcal:  1820  Protein:  160-180 gm  Fluid:  1.8 L    Molli Barrows, RD, LDN, CNSC Pager [phone] After Hours Pager [phone]

## 2019-03-11 NOTE — Progress Notes (Signed)
RN was able to speak with family in [place], [place] (daughter & next-of-kin), via Chief Operating Officer. Video call was arranged via elink for today at 4pm through Guadalupe's email [email or web address]. Many thanks to the [place] team for their continued support connecting families during this time.

## 2019-03-11 NOTE — Progress Notes (Signed)
Assisted tele visit to patient with family member.  Salvatrice Morandi R, RN  

## 2019-03-11 NOTE — Progress Notes (Signed)
[facility] Progress Note    Assessment/ Plan:   Pt is a 48 y.o. yo male  COVID, cardiogenic shock, hypoxia, VDRF, AKI on CRRT.  #Anuric AKI due to ATN, cardiogenic shock: On CRRT since 4/22, continue.  Discussed with nurse, tolerating  UF goal ~100 cc an hour net UF.  Currently on 4K bath and fortunately potassium is in the acceptable range.  Heparin for anticoagulation, no issue with the filter.  Has IJ catheter.  -prefilter/postfilter/dialysate 500/300/1500  -US renal with no hydronephrosis. -No sign of renal recovery.  Patient will need to continue renal replacement therapy w/ CRRT -> continnue UF net of 158m/hr.  #COVID-19, ARDS, hypoxic respiratory failure on mechanical ventilation: Received plasma.  Per PCCM.  #Anemia in critical illness: Transfusion as needed.  #Encephalopathy  Subjective:   [facility] events overnight[facility]   Objective:   BP 121/72   Pulse (!) 104   Temp 98.7 F (37.1 C) (Oral)   Resp 20   Ht '5\' 5"'  (1.651 m)   Wt 72.1 kg   SpO2 95%   BMI 26.45 kg/m   Intake/Output Summary (Last 24 hours) at 03/11/2019 1046 Last data filed at 03/11/2019 1000 Gross per 24 hour  Intake 1531.12 ml  Output 2699 ml  Net -1167.88 ml   Weight change: 3.4 kg  Physical Exam: D/W nursing staff: 1+ edema present P5 and FIO2 of 40%   Imaging: [place]  Result Date: 03/09/2019 CLINICAL DATA:  Patient with acute renal failure. EXAM: RENAL / URINARY TRACT ULTRASOUND COMPLETE COMPARISON:  None. FINDINGS: Right Kidney: Renal measurements: 13.3 x 5.6 x 7.1 cm = volume: 279.4 mL . Echogenicity within normal limits. No mass or hydronephrosis visualized. Left Kidney: Renal measurements: 12.4 x 6.2 x 6.1 cm = volume: 242.4 mL. Echogenicity within normal limits. No mass or hydronephrosis visualized. Bladder: Appears normal for degree of bladder distention. Incidental note is made echogenic liver suggestive of steatosis. Sludge in the  gallbladder lumen. IMPRESSION: No hydronephrosis. Electronically Signed   By: DLovey NewcomerM.D.   On: 03/09/2019 13:23   Dg Chest Port 1 View  Result Date: 03/11/2019 CLINICAL DATA:  Respiratory failure. EXAM: PORTABLE CHEST 1 VIEW COMPARISON:  03/10/2019 FINDINGS: Endotracheal tube tip approximately 2.5 cm above the carina. Bilateral central line and gastric decompression tube positioning stable. Bilateral airspace disease again noted with some improved expansion both lungs and improved aeration of the left lower lobe. No pneumothorax or significant pleural fluid. The heart size is stable. IMPRESSION: Improved expansion of both lungs with improved aeration of the left lower lobe. Electronically Signed   By: GAletta EdouardM.D.   On: 03/11/2019 07:44   Dg Chest Port 1 View  Result Date: 03/10/2019 CLINICAL DATA:  Respiratory failure. EXAM: PORTABLE CHEST 1 VIEW COMPARISON:  03/09/2019 FINDINGS: Endotracheal tube remains with the tip approximately 3 cm above the carina. Right jugular central line and left jugular non tunneled dialysis catheter demonstrates stable positioning. Gastric decompression tube continues to extend into the stomach. Bilateral pulmonary airspace disease relatively stable and lung volumes are stable. There may be slightly increased opacity of airspace disease in the retrocardiac left lower lobe. No pneumothorax or significant pleural effusions. IMPRESSION: Suggestion of increased density of airspace disease in the left lower lobe. Electronically Signed   By: GAletta EdouardM.D.   On: 03/10/2019 07:51    Labs: BMET Recent Labs  Lab 03/08/19 0313  03/08/19 1547 03/09/19 0356 03/09/19 1600 03/10/19  0400 03/10/19 1119 03/10/19 1750 03/11/19 0243 03/11/19 0535  NA 136   < > 135 134* 134* 135 136 135 135 134*  K 3.9   < > 3.9 4.2 4.3 4.4 4.1 4.1 4.0 4.1  CL 104  --  100 100 100 99  --  101 101  --   CO2 22  --  '26 22 23 23  ' --  24 24  --   GLUCOSE 153*  --  133* 108* 137*  183*  --  137* 129*  --   BUN 41*  --  30* 41* 43* 43*  --  53* 51*  --   CREATININE 1.81*  --  1.32* 2.06* 2.15* 2.56*  --  2.99* 2.69*  --   CALCIUM 8.7*  --  8.5* 8.3* 8.6* 8.9  --  9.0 9.0  --   PHOS 2.6  --  1.7* 2.9 2.5 2.1*  --  4.2 3.6  3.6  --    < > = values in this interval not displayed.   CBC Recent Labs  Lab 03/08/19 2334 03/09/19 0356  03/10/19 0400 03/10/19 1119 03/11/19 0243 03/11/19 0535  WBC 15.2* 16.6*  --  13.0*  --  14.6*  --   HGB 9.5* 9.2*   < > 8.0* 8.2* 8.2* 8.2*  HCT 31.2* 29.9*   < > 25.9* 24.0* 27.2* 24.0*  MCV 96.3 96.8  --  98.1  --  98.2  --   PLT 150 158  --  134*  --  167  --    < > = values in this interval not displayed.    Medications:    . sodium chloride   Intravenous Once  . chlorhexidine gluconate (MEDLINE KIT)  15 mL Mouth Rinse BID  . clonazePAM  2 mg Per Tube Q6H  . docusate  100 mg Per Tube BID  . famotidine  20 mg Per Tube Daily  . feeding supplement (PRO-STAT SUGAR FREE 64)  60 mL Per Tube TID  . heparin injection (subcutaneous)  7,500 Units Subcutaneous Q8H  . insulin aspart  0-15 Units Subcutaneous Q4H  . insulin aspart  6 Units Subcutaneous Q4H  . insulin glargine  8 Units Subcutaneous Daily  . mouth rinse  15 mL Mouth Rinse 10 times per day  . oxyCODONE  10 mg Oral Q4H  . pantoprazole sodium  40 mg Per Tube BID  . QUEtiapine  50 mg Oral BID  . sennosides  5 mL Oral BID      Otelia Santee, MD 03/11/2019, 10:46 AM

## 2019-03-11 NOTE — Evaluation (Signed)
Physical Therapy Evaluation Patient Details Name: Jonathan [name] MRN: [id number] DOB: 1971/06/17 Today's Date: 03/11/2019   History of Present Illness  48 y.o. male admitted on 02/23/19 with AMS, O2 sats in the ED were 50%.  Pt dx with COVID 19 and resultant ARDS.  He was intubated 4/21 adn remains intubated.  His course is complicated by acute kidney injury requiring CVVHD.    Clinical Impression  Bed level ROM eval and treatment.  Pt intubated ETT, PS/CPAP 40%, PEEP5, VSS throughout until the end when we moved him up in the bed and he coughed up bloody looking secretions, RN deep suctioned.  Pt tolerated bed level ROM and repositioning well.  He has seemingly no tone in all 4 extremities.  He is not spontaneously moving his legs or arms.  He has CVVHD running through L jugular.  PT will follow along acutely for readiness for increased mobility, positioning and ROM for now.        Follow Up Recommendations Other (comment)(yet TBD)    Equipment Recommendations  None recommended by PT    Recommendations for Other Services   None now-consider OT if he is appropriate for EOB/OOB mobility.    Precautions / Restrictions   Multiple lines/tubes     Mobility  Bed Mobility Overal bed mobility: Needs Assistance             General bed mobility comments: rolling and moving up in the bed total assist +2  Transfers                 General transfer comment: orders for ROM               Pertinent Vitals/Pain Pain Assessment: Faces Faces Pain Scale: No hurt    Home Living Family/patient expects to be discharged to:: Unsure                      Prior Function Level of Independence: Independent         Comments: migrant farm worker from [place]        Extremity/Trunk Assessment   Upper Extremity Assessment Upper Extremity Assessment: (ROM WNL low tone)    Lower Extremity Assessment Lower Extremity Assessment: (ROM WNL, low tone)    Cervical /  Trunk Assessment Cervical / Trunk Assessment: Normal  Communication   Communication: Prefers language other than English(Spanish)  Cognition Arousal/Alertness: Lethargic                                     General Comments: Pt non responsive      General Comments General comments (skin integrity, edema, etc.): Pt on PS/CPAP mode on vent FiO2 40% PEEP 5    Exercises General Exercises - Upper Extremity Shoulder Flexion: PROM;Both;10 reps Elbow Flexion: PROM;Both;10 reps Wrist Extension: PROM;Both;10 reps General Exercises - Lower Extremity Ankle Circles/Pumps: PROM;Both;10 reps Heel Slides: PROM;Both;10 reps Hip ABduction/ADduction: PROM;Both;10 reps Other Exercises Other Exercises: hip IR/ER x 10 reps bil   Assessment/Plan    PT Assessment Patient needs continued PT services  PT Problem List Decreased strength;Decreased range of motion;Decreased activity tolerance;Decreased mobility;Decreased balance;Decreased coordination;Decreased cognition;Decreased knowledge of use of DME;Decreased knowledge of precautions;Decreased safety awareness;Cardiopulmonary status limiting activity;Impaired tone;Decreased skin integrity       PT Treatment Interventions DME instruction;Gait training;Stair training;Functional mobility training;Therapeutic activities;Therapeutic exercise;Balance training;Neuromuscular re-education;Cognitive remediation;Patient/family education;Wheelchair mobility training;Manual techniques;Modalities    PT Goals (Current  goals can be found in the Care Plan section)  Acute Rehab PT Goals Patient Stated Goal: unable to state ETT PT Goal Formulation: Patient unable to participate in goal setting Time For Goal Achievement: 03/25/19 Potential to Achieve Goals: Fair    Frequency Min 2X/week           AM-PAC PT "6 Clicks" Mobility  Outcome Measure Help needed turning from your back to your side while in a flat bed without using bedrails?: Total Help  needed moving from lying on your back to sitting on the side of a flat bed without using bedrails?: Total Help needed moving to and from a bed to a chair (including a wheelchair)?: Total Help needed standing up from a chair using your arms (e.g., wheelchair or bedside chair)?: Total Help needed to walk in hospital room?: Total Help needed climbing 3-5 steps with a railing? : Total 6 Click Score: 6    End of Session Equipment Utilized During Treatment: Oxygen Activity Tolerance: Patient limited by lethargy Patient left: in bed;with nursing/sitter in room   PT Visit Diagnosis: Muscle weakness (generalized) (M62.81);Difficulty in walking, not elsewhere classified (R26.2)    Time: 1710-1730 PT Time Calculation (min) (ACUTE ONLY): 20 min   Charges:          Wells Guiles B. Zorian Gunderman, PT, DPT  Acute Rehabilitation [phone] pager #[phone] office   PT Evaluation $PT Eval High Complexity: 1 High          03/11/2019, 6:18 PM

## 2019-03-11 NOTE — Plan of Care (Signed)
Tolerating tube feeding at goal rate

## 2019-03-11 NOTE — Progress Notes (Signed)
PROGRESS NOTE                                                                                                                                                                                                             Patient Demographics:    Henrik Orihuela, is a 48 y.o. male, DOB - 04-Oct-1971, YKD:[id number]  Admit date - 02/23/2019   Admitting Physician Kipp Brood, MD  Outpatient Primary MD for the patient is Patient, No Pcp Per  LOS - 16   No chief complaint on file.      Brief Narrative    48 year old man intubated for severe respiratory failure requiring prone ventilation and nitric oxide for ARDS related to COVID-19. 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 Patient was intubated on admission, He did receive Actemra, he developed renal failure, started on CRRT, was paralyzed, requiring pruning, considering has improved, off paralysis, he was transferred to [facility] 03/10/2019.   Subjective:    Geraldo Pitter no significant events as discussed with staff, last bowel movement 4 days ago, afebrile, .   Assessment  & Plan :    Active Problems:   ARDS (adult respiratory distress syndrome) (HCC)   Pressure injury of skin   Acute respiratory failure with hypoxia ([place])   COVID-19   AKI (acute kidney injury) ([place])   Acute respiratory failure/ARDS due to COVID-19 infection -Vent management per PCCM, Sedation per PCCM, usually on ARDS protocol, with pruning, but paralysis, [place] failure is improving, he is on CPAP trials today, will his sedation has been tapered per PCCM. -Paralytics stopped 5/5 -Received Actemra 4/22 -received Convulsant plasma 03/07/2019  Anuric acute kidney injury -Due to ATN, shock, on CRRT, management per renal  Anemia -Due to critical illness, blood loss anemia, hemoglobin 8.2 today, no indication for  transfusion - monitor H&H closely and transfuse as needed I will defer to renal if Procrit is indicated.  Upper GI bleed -Patient had episode of hematemesis through his NG tube 4 days ago, with 2 to 3 g drop in his hemoglobin, this has been stabilized currently, most likely related to gastritis of acute illness, on Protonix 40 twice daily.  Elevated D-dimers -Likely related to hypercoagulable state by COVID-19 infection, initially on  heparin GTT which has been stopped after GI bleed, he is currently on subcu heparin, with 19 dose protocol 7500 units every 8 hours. -Would consider him resuming back on IV heparin few days if hemoglobin remained stable.  COVID-19 Labs  Recent Labs    03/09/19 0356  DDIMER 9.54*    Lab Results  Component Value Date   SARSCOV2NAA POSITIVE (A) 02/23/2019     Lab Results  Component Value Date   SARSCOV2NAA POSITIVE (A) 02/23/2019     Code Status : Full  Family Communication  : PCCM will D/W family  Disposition Plan  : remains in ICU  Consults  :  PCCM, Renal  Procedures  :  4/21 - ETT 7.5 >> 4/21 - RIJ [facility] >>  DVT Prophylaxis  :  [place] heparin per COVID-19 protocol  Lab Results  Component Value Date   PLT 167 03/11/2019    Antibiotics  :    Anti-infectives (From admission, onward)   Start     Dose/Rate Route Frequency Ordered Stop   03/06/19 1200  cefTRIAXone (ROCEPHIN) 2 g in sodium chloride 0.9 % 100 mL IVPB     2 g 200 mL/hr over 30 Minutes Intravenous Every 24 hours 03/06/19 0947 03/13/19 1159   02/24/19 1000  hydroxychloroquine (PLAQUENIL) tablet 200 mg  Status:  Discontinued     200 mg Oral 2 times daily 02/23/19 1051 02/24/19 0832   02/23/19 1100  hydroxychloroquine (PLAQUENIL) tablet 400 mg     400 mg Oral 2 times daily 02/23/19 1051 02/23/19 2143   02/23/19 1030  cefTRIAXone (ROCEPHIN) 2 g in sodium chloride 0.9 % 100 mL IVPB     2 g 200 mL/hr over 30 Minutes Intravenous Every 24 hours 02/23/19 1023 02/27/19  1057   02/23/19 1030  azithromycin (ZITHROMAX) 500 mg in sodium chloride 0.9 % 250 mL IVPB  Status:  Discontinued     500 mg 250 mL/hr over 60 Minutes Intravenous Every 24 hours 02/23/19 1023 02/27/19 1101        Objective:   Vitals:   03/11/19 0900 03/11/19 0915 03/11/19 0930 03/11/19 0945  BP: 135/76 125/78 122/76 121/72  Pulse: (!) 104 (!) 105 (!) 103 (!) 104  Resp: (!) 9 (!) 9 (!) 9 20  Temp:      TempSrc:      SpO2: 96%  95% 95%  Weight:      Height:        Wt Readings from Last 3 Encounters:  03/11/19 72.1 kg     Intake/Output Summary (Last 24 hours) at 03/11/2019 1044 Last data filed at 03/11/2019 1000 Gross per 24 hour  Intake 1531.12 ml  Output 2699 ml  Net -1167.88 ml     Physical Exam  Acutely ill appearing, sedated, intubated, in supine position, in no apparent distress  few bibasilar inspiratory crackles, no wheezing Abdomen soft, nontender, bowel sounds present Regular rate and rhythm, no rubs murmurs or gallops Remedy with no edema, clubbing or cyanosis.     Data Review:    CBC Recent Labs  Lab 03/08/19 1547 03/08/19 2334 03/09/19 0356 03/09/19 2208 03/10/19 0400 03/10/19 1119 03/11/19 0243 03/11/19 0535  WBC 16.5* 15.2* 16.6*  --  13.0*  --  14.6*  --   HGB 9.8* 9.5* 9.2* 8.1* 8.0* 8.2* 8.2* 8.2*  HCT 31.5* 31.2* 29.9* 26.2* 25.9* 24.0* 27.2* 24.0*  PLT 153 150 158  --  134*  --  167  --   MCV  96.9 96.3 96.8  --  98.1  --  98.2  --   MCH 30.2 29.3 29.8  --  30.3  --  29.6  --   MCHC 31.1 30.4 30.8  --  30.9  --  30.1  --   RDW 16.1* 16.3* 16.1*  --  16.7*  --  17.2*  --     Chemistries  Recent Labs  Lab 03/07/19 0324  03/08/19 0313  03/09/19 0356 03/09/19 1600 03/10/19 0400 03/10/19 1119 03/10/19 1750 03/11/19 0243 03/11/19 0535  NA 135   < > 136   < > 134* 134* 135 136 135 135 134*  K 4.3   < > 3.9   < > 4.2 4.3 4.4 4.1 4.1 4.0 4.1  CL 98   < > 104   < > 100 100 99  --  101 101  --   CO2 24   < > 22   < > _0 --   24 24  --   GLUCOSE 181*   < > 153*   < > 108* 137* 183*  --  137* 129*  --   BUN 40*   < > 41*   < > 41* 43* 43*  --  53* 51*  --   CREATININE 1.95*   < > 1.81*   < > 2.06* 2.15* 2.56*  --  2.99* 2.69*  --   CALCIUM 9.0   < > 8.7*   < > 8.3* 8.6* 8.9  --  9.0 9.0  --   MG 2.7*  --  2.6*  --  2.6*  --  2.6*  --   --  2.8*  --   AST  --   --  26  --   --   --   --   --   --   --   --   ALT  --   --  29  --   --   --   --   --   --   --   --   ALKPHOS  --   --  112  --   --   --   --   --   --   --   --   BILITOT  --   --  0.6  --   --   --   --   --   --   --   --    < > = values in this interval not displayed.   ------------------------------------------------------------------------------------------------------------------ No results for input(s): CHOL, HDL, LDLCALC, TRIG, CHOLHDL, LDLDIRECT in the last 72 hours.  No results found for: HGBA1C ------------------------------------------------------------------------------------------------------------------ No results for input(s): TSH, T4TOTAL, T3FREE, THYROIDAB in the last 72 hours.  Invalid input(s): FREET3 ------------------------------------------------------------------------------------------------------------------ No results for input(s): VITAMINB12, FOLATE, FERRITIN, TIBC, IRON, RETICCTPCT in the last 72 hours.  Coagulation profile Recent Labs  Lab 03/08/19 0313  INR 1.1    Recent Labs    03/09/19 0356  DDIMER 9.54*    Cardiac Enzymes Recent Labs  Lab 03/06/19 0301 03/07/19 0324 03/08/19 0313  TROPONINI 0.04* <0.03 <0.03   ------------------------------------------------------------------------------------------------------------------ No results found for: BNP  Inpatient Medications  Scheduled Meds:  sodium chloride   Intravenous Once   chlorhexidine gluconate (MEDLINE KIT)  15 mL Mouth Rinse BID   clonazePAM  2 mg Per Tube Q6H   docusate  100 mg Per Tube BID   famotidine  20 mg Per Tube Daily  feeding supplement (PRO-STAT SUGAR FREE 64)  60 mL Per Tube TID   heparin injection (subcutaneous)  7,500 Units Subcutaneous Q8H   insulin aspart  0-15 Units Subcutaneous Q4H   insulin aspart  6 Units Subcutaneous Q4H   insulin glargine  8 Units Subcutaneous Daily   mouth rinse  15 mL Mouth Rinse 10 times per day   oxyCODONE  10 mg Oral Q4H   pantoprazole sodium  40 mg Per Tube BID   QUEtiapine  50 mg Oral BID   sennosides  5 mL Oral BID   Continuous Infusions:   prismasol BGK 4/2.5 500 mL/hr at 03/11/19 0705    prismasol BGK 4/2.5 300 mL/hr at 03/10/19 1937   sodium chloride     sodium chloride 10 mL/hr at 03/11/19 1000   cefTRIAXone (ROCEPHIN)  IV Stopped (03/10/19 1152)   feeding supplement (VITAL AF 1.2 CAL) 20 mL/hr at 03/11/19 0114   heparin 10,000 units/ 20 mL infusion syringe 500 Units/hr (03/11/19 0000)   HYDROmorphone 0.5 mg/hr (03/11/19 1000)   midazolam Stopped (03/10/19 1442)   norepinephrine (LEVOPHED) Adult infusion Stopped (03/10/19 0825)   prismasol BGK 4/2.5 1,500 mL/hr at 03/11/19 0724   PRN Meds:.Place/Maintain arterial line **AND** sodium chloride, sodium chloride, acetaminophen (TYLENOL) oral liquid 160 mg/5 mL, heparin, HYDROmorphone  Micro Results Recent Results (from the past 240 hour(s))  Culture, respiratory (non-expectorated)     Status: None   Collection Time: 03/03/19 11:44 AM  Result Value Ref Range Status   Specimen Description TRACHEAL ASPIRATE  Final   Special Requests NONE  Final   Gram Stain   Final    FEW WBC PRESENT, PREDOMINANTLY PMN NO ORGANISMS SEEN Performed at [facility], [address] [place], [place] [address]    Culture   Final    RARE STAPHYLOCOCCUS AUREUS RARE CANDIDA ALBICANS    Report Status 03/06/2019 FINAL  Final   Organism ID, Bacteria STAPHYLOCOCCUS AUREUS  Final      Susceptibility   Staphylococcus aureus - MIC*    CIPROFLOXACIN <=0.5 SENSITIVE Sensitive     ERYTHROMYCIN <=0.25  SENSITIVE Sensitive     GENTAMICIN <=0.5 SENSITIVE Sensitive     OXACILLIN 0.5 SENSITIVE Sensitive     TETRACYCLINE <=1 SENSITIVE Sensitive     VANCOMYCIN 1 SENSITIVE Sensitive     TRIMETH/SULFA <=10 SENSITIVE Sensitive     CLINDAMYCIN <=0.25 SENSITIVE Sensitive     RIFAMPIN <=0.5 SENSITIVE Sensitive     Inducible Clindamycin NEGATIVE Sensitive     * RARE STAPHYLOCOCCUS AUREUS    Radiology Reports Dg Abd 1 View  Result Date: 03/03/2019 CLINICAL DATA:  Ileus. EXAM: ABDOMEN - 1 VIEW COMPARISON:  Radiograph dated 02/23/2019 FINDINGS: NG tube tip is in the antrum of the stomach. There is air scattered throughout nondistended large and small bowel loops. No visible free fluid or visible free air. Bones are normal. Infiltrates are noted at both lung bases. IMPRESSION: Benign-appearing abdomen.  NG tube in good position. Persistent bibasilar infiltrates. Electronically Signed   By: Lorriane Shire M.D.   On: 03/03/2019 12:31   Dg Abd 1 View  Result Date: 02/23/2019 CLINICAL DATA:  Nasogastric tube placement. EXAM: ABDOMEN - 1 VIEW COMPARISON:  None. FINDINGS: The bowel gas pattern is normal. Distal tip of nasogastric tube is seen in proximal stomach. No radio-opaque calculi or other significant radiographic abnormality are seen. IMPRESSION: Distal tip of nasogastric tube seen in proximal stomach. Electronically Signed   By: Bobbe Medico.D.  On: 02/23/2019 13:58   US Renal  Result Date: 03/09/2019 CLINICAL DATA:  Patient with acute renal failure. EXAM: RENAL / URINARY TRACT ULTRASOUND COMPLETE COMPARISON:  None. FINDINGS: Right Kidney: Renal measurements: 13.3 x 5.6 x 7.1 cm = volume: 279.4 mL . Echogenicity within normal limits. No mass or hydronephrosis visualized. Left Kidney: Renal measurements: 12.4 x 6.2 x 6.1 cm = volume: 242.4 mL. Echogenicity within normal limits. No mass or hydronephrosis visualized. Bladder: Appears normal for degree of bladder distention. Incidental note is made  echogenic liver suggestive of steatosis. Sludge in the gallbladder lumen. IMPRESSION: No hydronephrosis. Electronically Signed   By: Lovey Newcomer M.D.   On: 03/09/2019 13:23   Dg Chest Port 1 View  Result Date: 03/11/2019 CLINICAL DATA:  Respiratory failure. EXAM: PORTABLE CHEST 1 VIEW COMPARISON:  03/10/2019 FINDINGS: Endotracheal tube tip approximately 2.5 cm above the carina. Bilateral central line and gastric decompression tube positioning stable. Bilateral airspace disease again noted with some improved expansion both lungs and improved aeration of the left lower lobe. No pneumothorax or significant pleural fluid. The heart size is stable. IMPRESSION: Improved expansion of both lungs with improved aeration of the left lower lobe. Electronically Signed   By: Aletta Edouard M.D.   On: 03/11/2019 07:44   Dg Chest Port 1 View  Result Date: 03/10/2019 CLINICAL DATA:  Respiratory failure. EXAM: PORTABLE CHEST 1 VIEW COMPARISON:  03/09/2019 FINDINGS: Endotracheal tube remains with the tip approximately 3 cm above the carina. Right jugular central line and left jugular non tunneled dialysis catheter demonstrates stable positioning. Gastric decompression tube continues to extend into the stomach. Bilateral pulmonary airspace disease relatively stable and lung volumes are stable. There may be slightly increased opacity of airspace disease in the retrocardiac left lower lobe. No pneumothorax or significant pleural effusions. IMPRESSION: Suggestion of increased density of airspace disease in the left lower lobe. Electronically Signed   By: Aletta Edouard M.D.   On: 03/10/2019 07:51   Dg Chest Port 1 View  Result Date: 03/09/2019 CLINICAL DATA:  Respiratory distress EXAM: PORTABLE CHEST 1 VIEW COMPARISON:  03/08/2019 FINDINGS: Support devices are stable. Patchy bilateral airspace disease is stable. No effusions. Heart is normal size. No acute bony abnormality. IMPRESSION: Stable bilateral airspace opacities.  Electronically Signed   By: Rolm Baptise M.D.   On: 03/09/2019 01:32   Dg Chest Port 1 View  Result Date: 03/08/2019 CLINICAL DATA:  ARDS and coronavirus. EXAM: PORTABLE CHEST 1 VIEW COMPARISON:  Yesterday FINDINGS: Endotracheal tube tip at the clavicular heads. Bilateral central line with tip at the SVC. The orogastric tube tip reaches the stomach. Generalized airspace disease with patchy appearance that is stable. Normal heart size. No visible effusion or air leak. IMPRESSION: Stable hardware positioning and airspace disease. Electronically Signed   By: Monte Fantasia M.D.   On: 03/08/2019 05:33   Dg Chest Port 1 View  Result Date: 03/07/2019 CLINICAL DATA:  COVID-19 EXAM: PORTABLE CHEST 1 VIEW COMPARISON:  Portable exam 1403 hours compared to 03/06/2019 FINDINGS: Tip of endotracheal tube projects 3.2 cm above carina. Nasogastric tube extends into abdomen. BILATERAL jugular lines with tip projecting over SVC. Normal heart size, mediastinal contours, and pulmonary vascularity. Atherosclerotic calcification aorta. Patchy BILATERAL airspace infiltrates consistent with pneumonia. Observed infiltrates are more confluent at both lung bases and in RIGHT upper lobe, and improved in LEFT upper lobe. No pleural effusion or pneumothorax. IMPRESSION: BILATERAL pulmonary infiltrates, more confluent at both lung bases and RIGHT upper lobe. Improved aeration  LEFT upper lobe. Electronically Signed   By: Lavonia Dana M.D.   On: 03/07/2019 14:49   Dg Chest Port 1 View  Result Date: 03/06/2019 CLINICAL DATA:  Respiratory failure EXAM: PORTABLE CHEST 1 VIEW COMPARISON:  Yesterday FINDINGS: Endotracheal tube tip at the clavicular heads. The orogastric tube at least reaches the stomach. Right IJ line with tip near the SVC origin. Bilateral mainly peripheral airspace disease. Normal heart size. No effusion or pneumothorax. IMPRESSION: Stable hardware positioning and bilateral airspace disease. Electronically Signed   By:  Monte Fantasia M.D.   On: 03/06/2019 07:05   Dg Chest Port 1 View  Result Date: 03/05/2019 CLINICAL DATA:  Intubated EXAM: PORTABLE CHEST 1 VIEW COMPARISON:  Chest radiograph from earlier today. FINDINGS: Endotracheal tube tip is 5.8 cm above the carina. Enteric tube enters stomach with the tip not seen on this image. Right internal jugular central venous catheter terminates in the upper third of the SVC. Left internal jugular central venous catheter terminates in the upper third of the SVC. Stable cardiomediastinal silhouette with normal heart size. No pneumothorax. No pleural effusion. Severe patchy opacities throughout both lungs, most prominent in peripheral left lung, not substantially changed. IMPRESSION: 1. Well-positioned support structures as detailed. 2. No substantial change in severe patchy bilateral lung opacities most prominent in the peripheral left lung, compatible with known COVID-19 pneumonia. Electronically Signed   By: Ilona Sorrel M.D.   On: 03/05/2019 18:43   Dg Chest Port 1 View  Result Date: 03/05/2019 CLINICAL DATA:  Ventilator support.  Follow-up. EXAM: PORTABLE CHEST 1 VIEW COMPARISON:  Earlier same day FINDINGS: Endotracheal tube tip is 5 cm above the carina. Nasogastric or orogastric tube enters the abdomen. Dual lumen left internal jugular central line tip in the SVC at the azygos level. Single-lumen right internal jugular catheter in the SVC at the azygos level. Widespread patchy bilateral pulmonary infiltrates consistent with pneumonia persist. No worsening or new finding. IMPRESSION: No change since earlier today. Lines and tubes well positioned. Widespread patchy bilateral pulmonary infiltrates. Electronically Signed   By: Nelson Chimes M.D.   On: 03/05/2019 11:00   Dg Chest Port 1 View  Result Date: 03/05/2019 CLINICAL DATA:  Acute respiratory failure. EXAM: PORTABLE CHEST 1 VIEW COMPARISON:  Radiograph of March 04, 2019. FINDINGS: The heart size and mediastinal contours  are within normal limits. Endotracheal and nasogastric tubes are unchanged in position. Bilateral jugular catheters are unchanged in position. No pneumothorax or significant pleural effusion is noted. Stable bilateral diffuse but predominantly peripheral airspace opacities are noted in both lungs. The visualized skeletal structures are unremarkable. IMPRESSION: Stable support apparatus. Stable diffuse bilateral lung opacities as described above. Electronically Signed   By: Marijo Conception M.D.   On: 03/05/2019 07:21   Dg Chest Port 1 View  Result Date: 03/04/2019 CLINICAL DATA:  Ventilator dependence.  COVID-19 positive. EXAM: PORTABLE CHEST 1 VIEW COMPARISON:  Earlier same day FINDINGS: 1805 hours. Prone positioning. Endotracheal tube tip is 4.6 cm above the base of the carina. Right IJ central line tip overlies the innominate vein confluence. The left IJ central line tip also overlies the innominate vein confluence. The NG tube passes into the stomach although the distal tip position is not included on the film. The cardiopericardial silhouette is within normal limits for size. The peripheral bilateral airspace disease is generally similar to prior although there may be some increase confluence in the periphery of the right upper and mid lung period The visualized bony  structures of the thorax are intact. Telemetry leads overlie the chest. Prominent gastric bubble noted. IMPRESSION: 1. Overall similar appearance of the diffuse peripheral airspace consolidation with some probable mild progression peripherally in the right upper and mid lung. No pleural effusions. 2. Prominent gastric bubble despite the presence of an NG tube. 3. Remaining support apparatus stable. Electronically Signed   By: Misty Stanley M.D.   On: 03/04/2019 18:58   Dg Chest Port 1 View  Result Date: 03/04/2019 CLINICAL DATA:  Evaluate intubation after turning patient from prone to supine. EXAM: PORTABLE CHEST 1 VIEW COMPARISON:  March 03, 2019 FINDINGS: The ETT is in good position. The NG tube terminates below today's film. The right and left central lines are stable. No pneumothorax. Increasing bilateral pulmonary infiltrates. No other changes. IMPRESSION: 1. Support apparatus as above. 2. Increasing bilateral pulmonary infiltrates. Electronically Signed   By: Dorise Bullion III M.D   On: 03/04/2019 10:41   Dg Chest Port 1 View  Result Date: 03/03/2019 CLINICAL DATA:  ETT placement EXAM: PORTABLE CHEST 1 VIEW COMPARISON:  03/03/2011 FINDINGS: Endotracheal tube with the tip 4.5 cm above the carina. Right jugular central venous catheter with the tip projecting over the SVC. Left jugular dual lumen central venous catheter with the tip projecting over the SVC. Nasogastric tube projecting over the stomach. Bilateral interstitial and alveolar airspace opacities. Trace bilateral pleural effusions. Stable cardiomediastinal silhouette. No aggressive osseous lesion. IMPRESSION: 1. Support lines and tubing in satisfactory position. 2. Bilateral interstitial and alveolar airspace opacities and trace bilateral pleural effusions. Differential considerations include multilobar pneumonia versus pulmonary edema. Electronically Signed   By: Kathreen Devoid   On: 03/03/2019 12:31   Dg Chest Port 1 View  Result Date: 03/03/2019 CLINICAL DATA:  Acute respiratory, COVID-19 positive EXAM: PORTABLE CHEST 1 VIEW COMPARISON:  02/27/2019 chest radiograph. FINDINGS: Endotracheal tube tip is 7.1 cm above the carina. Right internal jugular central venous catheter terminates in right brachiocephalic vein near the junction with SVC. Left internal jugular central venous catheter terminates in the upper third of the SVC. Stable cardiomediastinal silhouette with normal heart size. No pneumothorax. No pleural effusion. Extensive patchy peripheral and basilar opacity in both lungs appears slightly worsened and more rounded in the right mid lung. IMPRESSION: 1. Support  structures as detailed. Endotracheal tube tip 7.1 cm above the carina. 2. Slight worsening of extensive patchy peripheral and basilar opacity in both lungs compatible with COVID-19 pneumonia. Electronically Signed   By: Ilona Sorrel M.D.   On: 03/03/2019 07:23   Dg Chest Port 1 View  Result Date: 02/27/2019 CLINICAL DATA:  Respiratory failure EXAM: PORTABLE CHEST 1 VIEW COMPARISON:  02/24/2019 FINDINGS: Endotracheal tube, NG tube, left jugular dialysis catheter, and right jugular central venous catheter are stable. Extensive bilateral airspace opacities are not significantly changed. No pneumothorax. Normal heart size. IMPRESSION: Extensive bilateral airspace disease is not significantly changed. Electronically Signed   By: Marybelle Killings M.D.   On: 02/27/2019 17:42   Dg Chest Port 1 View  Result Date: 02/24/2019 CLINICAL DATA:  Central line placement, COVID-19 positive EXAM: PORTABLE CHEST 1 VIEW COMPARISON:  02/23/2019 FINDINGS: Endotracheal tube 3 cm above the carina. Left jugular central venous catheter with the tip projecting over the SVC. Right jugular central venous catheter with the tip projecting over the SVC. Nasogastric tube coursing below the diaphragm. Diffuse bilateral patchy interstitial and alveolar airspace opacities. No significant pleural effusion or pneumothorax. Stable cardiomediastinal silhouette. No aggressive osseous lesion. IMPRESSION: 1.  Support lines and tubing in satisfactory position. 2. Diffuse bilateral patchy interstitial and alveolar airspace opacities consistent with known viral pneumonia. No significant interval change. Electronically Signed   By: Kathreen Devoid   On: 02/24/2019 14:26   Dg Chest Port 1 View  Result Date: 02/23/2019 CLINICAL DATA:  Hypoxia.  Positive COVID-19 EXAM: PORTABLE CHEST 1 VIEW COMPARISON:  February 23, 2019 study obtained earlier in the day FINDINGS: Endotracheal tube tip is 4.5 cm above the carina. Nasogastric tube tip and side port are in the  stomach. Central catheter tip is in the superior vena cava. No pneumothorax. There is extensive airspace opacity throughout the lungs bilaterally with the greatest degree of consolidation in the right mid lung. Heart size and pulmonary vascular normal. No adenopathy. No bone lesions. IMPRESSION: Tube and catheter positions as described without pneumothorax. Extensive airspace opacity bilaterally with consolidation developing in the right mid lung. Heart size within normal limits. Appearance similar to earlier in the day Electronically Signed   By: Lowella Grip III M.D.   On: 02/23/2019 13:57   Dg Chest Portable 1 View  Result Date: 02/23/2019 CLINICAL DATA:  48 year old with respiratory distress and intubated. EXAM: PORTABLE CHEST 1 VIEW COMPARISON:  None. FINDINGS: Endotracheal tube is 4.7 cm above the carina. Right jugular central line with the tip in the upper SVC region. Negative for pneumothorax. Extensive patchy bilateral airspace disease. Heart size is within normal limits. Bone structures are unremarkable. IMPRESSION: 1. Bilateral airspace disease. Findings are suggestive for multifocal pneumonia. 2. Endotracheal tube is appropriately positioned. 3. Right jugular line in the upper SVC region. Negative for pneumothorax. Electronically Signed   By: Markus Daft M.D.   On: 02/23/2019 10:43   Dg Abd Portable 1v  Result Date: 03/09/2019 CLINICAL DATA:  OG tube placement EXAM: PORTABLE ABDOMEN - 1 VIEW COMPARISON:  None. FINDINGS: OG tube tip is in the distal stomach. Nonobstructive bowel gas pattern. IMPRESSION: OG tube tip in the distal stomach. Electronically Signed   By: Rolm Baptise M.D.   On: 03/09/2019 01:32    Time Spent in minutes:  35 minutes   Phillips Climes M.D on 03/11/2019 at 10:44 AM  Between 7am to 7pm - Pager - [phone]  After 7pm go to [email or web address] - password [email or web address]  [facility] -  Office  [phone]

## 2019-03-11 NOTE — Consult Note (Signed)
[place] Nurse wound follow up Patient is receiving care in [place].  I have sent a SecureChat message to Elyn Peers, RN asking her how the hydrocolloid dressings to the facial wounds are working out.  I am awaiting her response. Monitor the wound area(s) for worsening of condition such as: Signs/symptoms of infection,  Increase in size,  Development of or worsening of odor, Development of pain, or increased pain at the affected locations.  Notify the medical team if any of these develop.  Val Riles, RN, MSN, CWOCN, CNS-BC, pager [phone]

## 2019-03-11 NOTE — Plan of Care (Signed)
Patient remains guarded, no changes in status at this time. See assessment.

## 2019-03-11 NOTE — Progress Notes (Signed)
NAME:  Jonathan [name], MRN:  [id number], DOB:  [birth year], LOS: 38 ADMISSION DATE:  02/23/2019, CONSULTATION DATE: February 23, 2019 REFERRING MD: Morton Amy, CHIEF COMPLAINT: Dyspnea  Brief History   48 year old male admitted on February 23, 2019 with ARDS due to COVID-19.  Has had severe ARDS required prone ventilation for many days, developed acute kidney injury requiring continuous renal replacement therapy.  Past Medical History  Diabetes mellitus  Significant Hospital Events   4/21 admitted and immediately proned on inverse ratio PC ventilation with high PEEP to maintain saturation >85%, Started on NO at 40ppm. 4/22- Tocluzimab X 1 4/22- started CVVHD 4/21-4/24 Proned daily 4/28 -4/30 proned daily , tapered NO to off  4/30 -late evening with increased O2 demands . No paralytics required x 24hr . No vent dysyncrony . Calm on Fent /Versed. Off precedex   5/1 - Remains on CRRT , no acute issues overnight --4.8 L I/O Balance since admit . Continues to Prone on/off 16 on and 8 off . -CXR stable diffuse bilateral infiltrates (slightly improved aeration ) . , Vent .80Fio2 .   Biomarkes improving but severe ARDS persists ad D-dimer still very high (13) without improvement. On IV Heparin gtt. Growing MSSA. SEdation changed to dilaudid gtt. Continues prone 16h and 8h syupine. Currently supine at 80% fio2, peep 18. On TF. STARTED NIMBEX  5/4-5/5 -able to stay in the supine position, able to discontinue paralysis and maintain FiO2 0.50, PEEP 8-10.   5/5 hematochezia, bloody gastric aspirate  Consults:  PCCM 4/21  Procedures:  4/21 - ETT 7.5 >> 4/21 - RIJ CVC [facility] >> 4/22 L IJ HD Cath>   Significant Diagnostic Tests:  POC U/S 4/21: Bilateral interstitial pattern with bilateral dense consolidation at bases. Echo shows low-normal LVSF. RV moderately dilated with septal shift.   Micro Data:  Blood cultures -PND SARS-CoV2 02/23/2019 - - POSITIVE HIV 4/21 -neg Quant gold 4/2  1- neg .....................Marland Kitchen resp 4/29 >>rare MSSA and yeast   Anti-covid RX and antimicrobial RX  Azithromycin 4/21 >>4/25 Ceftriaxone 4/22 >>4/25 Actemra 4/22 Hydroxychloroquine 4/21 > 4/22 ........................... Ceftriaxone 5/2 >>    Interim history/subjective:  Arrived from [facility] yesterday Remains mechanically ventilated On pressure support ventilation this morning Remains on CVVHD Heavily sedated.  Objective   Blood pressure 126/74, pulse (!) 101, temperature 97.9 F (36.6 C), temperature source Oral, resp. rate 13, height 5\' 5"  (1.651 m), weight 72.1 kg, SpO2 96 %. CVP:  [1 mmHg-16 mmHg] 3 mmHg  Vent Mode: PSV;CPAP FiO2 (%):  [40 %] 40 % Set Rate:  [30 bmp] 30 bmp Vt Set:  [360 mL] 360 mL PEEP:  [5 cmH20-8 cmH20] 5 cmH20 Pressure Support:  [5 cmH20] 5 cmH20 Plateau Pressure:  [15 cmH20-20 cmH20] 20 cmH20   Intake/Output Summary (Last 24 hours) at 03/11/2019 1323 Last data filed at 03/11/2019 1200 Gross per 24 hour  Intake 1251.22 ml  Output 2428 ml  Net -1176.78 ml   Filed Weights   03/10/19 0500 03/10/19 2000 03/11/19 0507  Weight: 69 kg 72.4 kg 72.1 kg    Examination:  General:  In bed on vent HENT: NCAT ETT in place PULM: CTA B, vent supported breathing CV: RRR, no mgr GI: BS+, soft, nontender MSK: normal bulk and tone Neuro: sedated on vent  Chest x-ray images independently reviewed showing improving bilateral infiltrates, support apparatus in place including hemodialysis catheter and endotracheal tube  Resolved Hospital Problem list     Assessment & Plan:  ARDS due to COVID-19: Decrease sedation to target RA SS score 0 to -1 Wean clonazepam Stop midazolam drip Continue Seroquel Daily spontaneous breathing trial Maintain PEEP and FiO2 as necessary to keep SaO2 greater than 88% Ventilator associated pneumonia prevention protocol Volume removal with hemodialysis as able  Acute kidney injury: CVVHD per nephrology  MSSA  pneumonia: Ceftriaxone  Need for sedation for ventilator synchrony: At this point can decrease sedation Wean clonazepam Stop medazepam infusion Continue Seroquel  Best practice:  Diet: tube feeding Pain/Anxiety/Delirium protocol (if indicated): yes, as above order PAD protocol dilaudid infusion, RASS goal 0 to -1 VAP protocol (if indicated): yes DVT prophylaxis: sub cutaneous heparin GI prophylaxis: PPI, H2 blocker Glucose control: monitor  Mobility: bed rest, start passive range of motion Code Status: partial code, no CPR Family Communication: called [place] sister, left message Disposition: remain in ICU  Labs   CBC: Recent Labs  Lab 03/08/19 1547 03/08/19 2334 03/09/19 0356 03/09/19 2208 03/10/19 0400 03/10/19 1119 03/11/19 0243 03/11/19 0535  WBC 16.5* 15.2* 16.6*  --  13.0*  --  14.6*  --   HGB 9.8* 9.5* 9.2* 8.1* 8.0* 8.2* 8.2* 8.2*  HCT 31.5* 31.2* 29.9* 26.2* 25.9* 24.0* 27.2* 24.0*  MCV 96.9 96.3 96.8  --  98.1  --  98.2  --   PLT 153 150 158  --  134*  --  167  --     Basic Metabolic Panel: Recent Labs  Lab 03/07/19 0324  03/08/19 0313  03/09/19 0356 03/09/19 1600 03/10/19 0400 03/10/19 1119 03/10/19 1750 03/11/19 0243 03/11/19 0535  NA 135   < > 136   < > 134* 134* 135 136 135 135 134*  K 4.3   < > 3.9   < > 4.2 4.3 4.4 4.1 4.1 4.0 4.1  CL 98   < > 104   < > 100 100 99  --  101 101  --   CO2 24   < > 22   < > 22 23 23   --  24 24  --   GLUCOSE 181*   < > 153*   < > 108* 137* 183*  --  137* 129*  --   BUN 40*   < > 41*   < > 41* 43* 43*  --  53* 51*  --   CREATININE 1.95*   < > 1.81*   < > 2.06* 2.15* 2.56*  --  2.99* 2.69*  --   CALCIUM 9.0   < > 8.7*   < > 8.3* 8.6* 8.9  --  9.0 9.0  --   MG 2.7*  --  2.6*  --  2.6*  --  2.6*  --   --  2.8*  --   PHOS 3.8   < > 2.6   < > 2.9 2.5 2.1*  --  4.2 3.6  3.6  --    < > = values in this interval not displayed.   GFR: Estimated Creatinine Clearance: 29.2 mL/min (A) (by C-G formula based on SCr of 2.69  mg/dL (H)). Recent Labs  Lab 03/08/19 0000  03/08/19 2334 03/09/19 0356 03/10/19 0400 03/11/19 0243  WBC  --    < > 15.2* 16.6* 13.0* 14.6*  LATICACIDVEN 1.2  --   --   --   --   --    < > = values in this interval not displayed.    Liver Function Tests: Recent Labs  Lab 03/08/19 0313  03/09/19 0356  03/09/19 1600 03/10/19 0400 03/10/19 1750 03/11/19 0243  AST 26  --   --   --   --   --   --   ALT 29  --   --   --   --   --   --   ALKPHOS 112  --   --   --   --   --   --   BILITOT 0.6  --   --   --   --   --   --   PROT 6.5  --   --   --   --   --   --   ALBUMIN 2.7*  2.6*   < > 2.5* 2.5* 2.9* 3.3* 3.3*   < > = values in this interval not displayed.   No results for input(s): LIPASE, AMYLASE in the last 168 hours. No results for input(s): AMMONIA in the last 168 hours.  ABG    Component Value Date/Time   PHART 7.334 (L) 03/11/2019 0535   PCO2ART 47.7 03/11/2019 0535   PO2ART 67.0 (L) 03/11/2019 0535   HCO3 25.2 03/11/2019 0535   TCO2 27 03/11/2019 0535   ACIDBASEDEF 1.0 03/11/2019 0535   O2SAT 91.0 03/11/2019 0535     Coagulation Profile: Recent Labs  Lab 03/08/19 0313  INR 1.1    Cardiac Enzymes: Recent Labs  Lab 03/06/19 0301 03/07/19 0324 03/08/19 0313  TROPONINI 0.04* <0.03 <0.03    HbA1C: No results found for: HGBA1C  CBG: Recent Labs  Lab 03/10/19 1717 03/10/19 1953 03/11/19 0326 03/11/19 0801 03/11/19 1138  GLUCAP 114* 158* 121* 128* 131*     Critical care time: 35 minutes     Roselie Awkward, MD [place] [facility] Pager: [phone] Cell: [phone] If no response, call [phone]

## 2019-03-12 DIAGNOSIS — N170 Acute kidney failure with tubular necrosis: Secondary | ICD-10-CM

## 2019-03-12 LAB — RENAL FUNCTION PANEL
Albumin: 3.6 g/dL (ref 3.5–5.0)
Anion gap: 9 (ref 5–15)
BUN: 49 mg/dL — ABNORMAL HIGH (ref 6–20)
CO2: 25 mmol/L (ref 22–32)
Calcium: 9.1 mg/dL (ref 8.9–10.3)
Chloride: 99 mmol/L (ref 98–111)
Creatinine, Ser: 2.29 mg/dL — ABNORMAL HIGH (ref 0.61–1.24)
GFR calc Af Amer: 38 mL/min — ABNORMAL LOW (ref >60)
GFR calc non Af Amer: 33 mL/min — ABNORMAL LOW (ref >60)
Glucose, Bld: 162 mg/dL — ABNORMAL HIGH (ref 70–99)
Phosphorus: 3.6 mg/dL (ref 2.5–4.6)
Potassium: 4.3 mmol/L (ref 3.5–5.1)
Sodium: 133 mmol/L — ABNORMAL LOW (ref 135–145)

## 2019-03-12 LAB — CBC
HCT: 28.4 % — ABNORMAL LOW (ref 39.0–52.0)
Hemoglobin: 8.7 g/dL — ABNORMAL LOW (ref 13.0–17.0)
MCH: 30.2 pg (ref 26.0–34.0)
MCHC: 30.6 g/dL (ref 30.0–36.0)
MCV: 98.6 fL (ref 80.0–100.0)
Platelets: 158 10*3/uL (ref 150–400)
RBC: 2.88 MIL/uL — ABNORMAL LOW (ref 4.22–5.81)
RDW: 17.5 % — ABNORMAL HIGH (ref 11.5–15.5)
WBC: 14.5 10*3/uL — ABNORMAL HIGH (ref 4.0–10.5)
nRBC: 0.3 % — ABNORMAL HIGH (ref 0.0–0.2)

## 2019-03-12 LAB — GLUCOSE, CAPILLARY
Glucose-Capillary: 151 mg/dL — ABNORMAL HIGH (ref 70–99)
Glucose-Capillary: 139 mg/dL — ABNORMAL HIGH (ref 70–99)
Glucose-Capillary: 188 mg/dL — ABNORMAL HIGH (ref 70–99)
Glucose-Capillary: 188 mg/dL — ABNORMAL HIGH (ref 70–99)
Glucose-Capillary: 223 mg/dL — ABNORMAL HIGH (ref 70–99)

## 2019-03-12 LAB — POCT I-STAT, CHEM 8
BUN: 30 mg/dL — ABNORMAL HIGH (ref 6–20)
Calcium, Ion: 1.26 mmol/L (ref 1.15–1.40)
Chloride: 100 mmol/L (ref 98–111)
Creatinine, Ser: 1.6 mg/dL — ABNORMAL HIGH (ref 0.61–1.24)
Glucose, Bld: 162 mg/dL — ABNORMAL HIGH (ref 70–99)
HCT: 30 % — ABNORMAL LOW (ref 39.0–52.0)
Hemoglobin: 10.2 g/dL — ABNORMAL LOW (ref 13.0–17.0)
Potassium: 4.6 mmol/L (ref 3.5–5.1)
Sodium: 135 mmol/L (ref 135–145)
TCO2: 28 mmol/L (ref 22–32)

## 2019-03-12 LAB — FIBRINOGEN: Fibrinogen: >800 mg/dL — ABNORMAL HIGH (ref 210–475)

## 2019-03-12 LAB — MAGNESIUM: Magnesium: 2.8 mg/dL — ABNORMAL HIGH (ref 1.7–2.4)

## 2019-03-12 LAB — LACTATE DEHYDROGENASE: LDH: 386 U/L — ABNORMAL HIGH (ref 98–192)

## 2019-03-12 MED ORDER — SODIUM CHLORIDE 0.9 % IV SOLN
INTRAVENOUS | Status: DC | PRN
  Administered 2019-03-16 – 2019-03-18 (×3): 250 mL via INTRAVENOUS
  Administered 2019-03-19: 22:00:00 35 mL via INTRAVENOUS
  Administered 2019-03-21: 08:00:00 250 mL via INTRAVENOUS

## 2019-03-12 MED ORDER — HYDRALAZINE HCL 25 MG PO TABS
25.0000 mg | ORAL_TABLET | Freq: Three times a day (TID) | ORAL | Status: DC
  Administered 2019-03-12 – 2019-03-15 (×9): 25 mg via ORAL
  Filled 2019-03-12 (×11): qty 1

## 2019-03-12 MED ORDER — OXYCODONE HCL 5 MG PO TABS
10.0000 mg | ORAL_TABLET | Freq: Four times a day (QID) | ORAL | Status: DC
  Administered 2019-03-12 – 2019-03-14 (×6): 10 mg via ORAL
  Filled 2019-03-12 (×7): qty 2

## 2019-03-12 NOTE — Progress Notes (Signed)
PT Cancellation Note  Patient Details Name: Jonathan [name] MRN: [id number] DOB: 12-Jul-1971   Cancelled Treatment:    Reason Eval/Treat Not Completed: Other (comment). Pt flaccid with all extremities. Nursing has performed PROM.    Shary Decamp [facility] 03/12/2019, 3:17 PM [place] Pager [phone] Office [phone]

## 2019-03-12 NOTE — Progress Notes (Signed)
Pt vomited, immediately suctioned. TF held, O2 sats currently 96%.  2 hours post-emesis, checked gastric residual on pt - 95 cc. Pt O2 sats 95%. Orogastric tube has not been displaced. Tube feeds restarted at this time.  Sherlie Ban, RN

## 2019-03-12 NOTE — Consult Note (Signed)
[place] Nurse wound follow up Patient receiving care in [place].  I spoke with Crystal, primary RN.  She reported that the hydrocolloid dressing to the bilateral cheek wounds are in place.  The dressings have not been changed today, but she will change them before she leaves today.  WOC will f/u next week to determine wound response to the hydrocolloid dressings.  Monitor the wound area(s) for worsening of condition such as: Signs/symptoms of infection,  Increase in size,  Development of or worsening of odor, Development of pain, or increased pain at the affected locations.  Notify the medical team if any of these develop.  Val Riles, RN, MSN, CWOCN, CNS-BC, pager [phone]

## 2019-03-12 NOTE — Progress Notes (Signed)
Renal Navigator received call from CM/S. Claxton on 03/10/19 regarding plan for patient to return home to [place] pending medical stability. She states he will most likely require OP HD if he is able to discharge from the hospital and requests for Renal Navigator to research facilities near his home to provide this. Research shows that there are two centers within an hour of his home that provide OP HD: [facility] and [facility] Both are located in [place], [place]. Renal Navigator monitoring for patient improvement and information from Nephrologist regarding need for OP HD seat before moving forward.   Alphonzo Cruise Renal Navigator [phone]

## 2019-03-12 NOTE — Progress Notes (Signed)
[facility] PROGRESS NOTE    Progress Note  Jonathan [name]  EML:[id number] DOB: 27-Aug-1971 DOA: 02/23/2019 PCP: Patient, No Pcp Per     Brief Narrative:   Jonathan [name] is an 48 y.o. male noninjected speaker arrived from [place] on 02/13/2019 lesion close quarters with 6 of the workers came in in respiratory distress intubated for ARDS due to COVID-19 infection started on CRRT, paralyzed and prone.  4/22- Tocluzimab X 1 4/22- started CVVHD 4/21-4/24 Proned daily 4/28 -4/30 proned daily   Lines and tubes: 4/21 - ETT 7.5 >> 4/21 - RIJ [facility] >> 4/22 L IJ HD Cath>    Assessment/Plan:   Acute respiratory failure with hypoxia/COVID-19/  ARDS (adult respiratory distress syndrome) (HCC) Continue vent management per  PCCM on ARDS HD protocol Paralytic stop on 03/09/2019. He received Acterma on 02/24/2019 Received convalescent plasma on 03/07/2019 He has remained afebrile.  ATN: Due to shock, currently on CRRT per renal.  Anemia due to chronic illness and possibly blood loss bleed: Hemoglobin has been stable, today is 8.4. 4 days prior to this note had an episode of hematemesis through his NG tube with a drop of 3 g in his hemoglobin, there was a concern about gastritis. So he was started empirically on IV Protonix twice daily. He is currently not on anticoagulation due to the high risk of bleeding. SCDs on.  Elevated d-dimer: Likely related to inflammatory state in the setting of hypercoagulable state.  He was initially on IV heparin which was stopped after GI bleed. Discussed PCCM about resuming his anticoagulation in a few days. We will check a fibrinogen, haptoglobin and LDH  Pressure injury of skin on the nose:  Obese:    RN Pressure Injury Documentation: Pressure Injury 02/27/19 Stage II -  Partial thickness loss of dermis presenting as a shallow open ulcer with a red, pink wound bed without slough.  (Active)  02/27/19 1707  Location: Nose   Location Orientation: Anterior  Staging: Stage II -  Partial thickness loss of dermis presenting as a shallow open ulcer with a red, pink wound bed without slough.  Wound Description (Comments):   Present on Admission: No     Pressure Injury 03/03/19 Deep Tissue Injury - Purple or maroon localized area of discolored intact skin or blood-filled blister due to damage of underlying soft tissue from pressure and/or shear. 2cm x2 1/4 cm  red edges  with black cneter (Active)  03/03/19 0830  Location: Face  Location Orientation: Right  Staging: Deep Tissue Injury - Purple or maroon localized area of discolored intact skin or blood-filled blister due to damage of underlying soft tissue from pressure and/or shear.  Wound Description (Comments): 2cm x2 1/4 cm  red edges  with black cneter  Present on Admission: No     Pressure Injury 03/03/19 Deep Tissue Injury - Purple or maroon localized area of discolored intact skin or blood-filled blister due to damage of underlying soft tissue from pressure and/or shear. 1 1/2 cm x 2 1/2 cm red edges with yellow center and black (Active)  03/03/19 0830  Location: Face  Location Orientation: Left  Staging: Deep Tissue Injury - Purple or maroon localized area of discolored intact skin or blood-filled blister due to damage of underlying soft tissue from pressure and/or shear.  Wound Description (Comments): 1 1/2 cm x 2 1/2 cm red edges with yellow center and black ring around yellow area  Present on Admission: No    Estimated body  mass index is 25.94 kg/m as calculated from the following:   Height as of this encounter: '5\' 5"'  (1.651 m).   Weight as of this encounter: 70.7 kg. Malnutrition Type:  Nutrition Problem: Inadequate oral intake Etiology: inability to eat   Malnutrition Characteristics:  Signs/Symptoms: NPO status   Nutrition Interventions:  Interventions: Tube feeding    DVT prophylaxis: lovenox Family Communication:none Disposition  Plan/Barrier to D/C: unable to determine Code Status:     Code Status Orders  (From admission, onward)         Start     Ordered   03/02/19 1658  Limited resuscitation (code)  Continuous    Question Answer Comment  In the event of cardiac or respiratory ARREST: Initiate Code Blue, Call Rapid Response No   In the event of cardiac or respiratory ARREST: Perform CPR No   In the event of cardiac or respiratory ARREST: Perform Intubation/Mechanical Ventilation Yes   In the event of cardiac or respiratory ARREST: Use NIPPV/BiPAp only if indicated Yes   In the event of cardiac or respiratory ARREST: Administer ACLS medications if indicated Yes   In the event of cardiac or respiratory ARREST: Perform Defibrillation or Cardioversion if indicated Yes      03/02/19 1657        Code Status History    Date Active Date Inactive Code Status Order ID Comments User Context   02/23/2019 1051 03/02/2019 1657 Full Code [id number]  Kipp Brood, MD ED        IV Access:    Peripheral IV   Procedures and diagnostic studies:   Dg Chest Port 1 View  Result Date: 03/11/2019 CLINICAL DATA:  Respiratory failure. EXAM: PORTABLE CHEST 1 VIEW COMPARISON:  03/10/2019 FINDINGS: Endotracheal tube tip approximately 2.5 cm above the carina. Bilateral central line and gastric decompression tube positioning stable. Bilateral airspace disease again noted with some improved expansion both lungs and improved aeration of the left lower lobe. No pneumothorax or significant pleural fluid. The heart size is stable. IMPRESSION: Improved expansion of both lungs with improved aeration of the left lower lobe. Electronically Signed   By: Aletta Edouard M.D.   On: 03/11/2019 07:44     Medical Consultants:    None.  Anti-Infectives:   Azithromycin 4/21 >>4/25 Ceftriaxone 4/22 >>4/25 Actemra 4/22 Hydroxychloroquine 4/21 > 4/22  Subjective:    Jonathan [name] intubated  Objective:    Vitals:    03/12/19 0400 03/12/19 0500 03/12/19 0600 03/12/19 0700  BP: 121/83 137/90 (!) 127/92 (!) 140/97  Pulse: (!) 115 (!) 115 (!) 114 (!) 112  Resp: '15 16 13 15  ' Temp:      TempSrc:      SpO2: 92% 93% 95% 94%  Weight:  70.7 kg    Height:        Intake/Output Summary (Last 24 hours) at 03/12/2019 0740 Last data filed at 03/12/2019 0700 Gross per 24 hour  Intake 1432.25 ml  Output 3368 ml  Net -1935.75 ml   Filed Weights   03/10/19 2000 03/11/19 0507 03/12/19 0500  Weight: 72.4 kg 72.1 kg 70.7 kg    Exam: General exam: Intubated in supine position. Respiratory system: Good air movement with crackles at bases Cardiovascular system: Regular rate and rhythm with positive S1-S2. Gastrointestinal system: Bowel sounds soft nontender nondistended. Extremities: Trace edema. Skin: No rashes, lesions or ulcers   Data Reviewed:    Labs: Basic Metabolic Panel: Recent Labs  Lab 03/08/19 0313  03/09/19 0356 03/09/19  1600 03/10/19 0400 03/10/19 1119 03/10/19 1750 03/11/19 0243 03/11/19 0535 03/12/19 0223  NA 136   < > 134* 134* 135 136 135 135 134* 133*  K 3.9   < > 4.2 4.3 4.4 4.1 4.1 4.0 4.1 4.3  CL 104   < > 100 100 99  --  101 101  --  99  CO2 22   < > '22 23 23  ' --  24 24  --  25  GLUCOSE 153*   < > 108* 137* 183*  --  137* 129*  --  162*  BUN 41*   < > 41* 43* 43*  --  53* 51*  --  49*  CREATININE 1.81*   < > 2.06* 2.15* 2.56*  --  2.99* 2.69*  --  2.29*  CALCIUM 8.7*   < > 8.3* 8.6* 8.9  --  9.0 9.0  --  9.1  MG 2.6*  --  2.6*  --  2.6*  --   --  2.8*  --  2.8*  PHOS 2.6   < > 2.9 2.5 2.1*  --  4.2 3.6  3.6  --  3.6   < > = values in this interval not displayed.   GFR Estimated Creatinine Clearance: 34.3 mL/min (A) (by C-G formula based on SCr of 2.29 mg/dL (H)). Liver Function Tests: Recent Labs  Lab 03/08/19 0313  03/09/19 1600 03/10/19 0400 03/10/19 1750 03/11/19 0243 03/12/19 0223  AST 26  --   --   --   --   --   --   ALT 29  --   --   --   --   --   --    ALKPHOS 112  --   --   --   --   --   --   BILITOT 0.6  --   --   --   --   --   --   PROT 6.5  --   --   --   --   --   --   ALBUMIN 2.7*  2.6*   < > 2.5* 2.9* 3.3* 3.3* 3.6   < > = values in this interval not displayed.   No results for input(s): LIPASE, AMYLASE in the last 168 hours. No results for input(s): AMMONIA in the last 168 hours. Coagulation profile Recent Labs  Lab 03/08/19 0313  INR 1.1   COVID-19 Labs  No results for input(s): DDIMER, FERRITIN, LDH, CRP in the last 72 hours.  Lab Results  Component Value Date   SARSCOV2NAA POSITIVE (A) 02/23/2019    CBC: Recent Labs  Lab 03/08/19 2334 03/09/19 0356  03/10/19 0400 03/10/19 1119 03/11/19 0243 03/11/19 0535 03/12/19 0223  WBC 15.2* 16.6*  --  13.0*  --  14.6*  --  14.5*  HGB 9.5* 9.2*   < > 8.0* 8.2* 8.2* 8.2* 8.7*  HCT 31.2* 29.9*   < > 25.9* 24.0* 27.2* 24.0* 28.4*  MCV 96.3 96.8  --  98.1  --  98.2  --  98.6  PLT 150 158  --  134*  --  167  --  158   < > = values in this interval not displayed.   Cardiac Enzymes: Recent Labs  Lab 03/06/19 0301 03/07/19 0324 03/08/19 0313  TROPONINI 0.04* <0.03 <0.03   BNP (last 3 results) No results for input(s): PROBNP in the last 8760 hours. CBG: Recent Labs  Lab 03/11/19 1138 03/11/19 1647 03/11/19 2017  03/11/19 2323 03/12/19 0343  GLUCAP 131* 193* 187* 199* 151*   D-Dimer: No results for input(s): DDIMER in the last 72 hours. Hgb A1c: No results for input(s): HGBA1C in the last 72 hours. Lipid Profile: No results for input(s): CHOL, HDL, LDLCALC, TRIG, CHOLHDL, LDLDIRECT in the last 72 hours. Thyroid function studies: No results for input(s): TSH, T4TOTAL, T3FREE, THYROIDAB in the last 72 hours.  Invalid input(s): FREET3 Anemia work up: No results for input(s): VITAMINB12, FOLATE, FERRITIN, TIBC, IRON, RETICCTPCT in the last 72 hours. Sepsis Labs: Recent Labs  Lab 03/08/19 0000  03/09/19 0356 03/10/19 0400 03/11/19 0243 03/12/19 0223   WBC  --    < > 16.6* 13.0* 14.6* 14.5*  LATICACIDVEN 1.2  --   --   --   --   --    < > = values in this interval not displayed.   Microbiology Recent Results (from the past 240 hour(s))  Culture, respiratory (non-expectorated)     Status: None   Collection Time: 03/03/19 11:44 AM  Result Value Ref Range Status   Specimen Description TRACHEAL ASPIRATE  Final   Special Requests NONE  Final   Gram Stain   Final    FEW WBC PRESENT, PREDOMINANTLY PMN NO ORGANISMS SEEN Performed at [facility], [address] [place], [place] [address]    Culture   Final    RARE STAPHYLOCOCCUS AUREUS RARE CANDIDA ALBICANS    Report Status 03/06/2019 FINAL  Final   Organism ID, Bacteria STAPHYLOCOCCUS AUREUS  Final      Susceptibility   Staphylococcus aureus - MIC*    CIPROFLOXACIN <=0.5 SENSITIVE Sensitive     ERYTHROMYCIN <=0.25 SENSITIVE Sensitive     GENTAMICIN <=0.5 SENSITIVE Sensitive     OXACILLIN 0.5 SENSITIVE Sensitive     TETRACYCLINE <=1 SENSITIVE Sensitive     VANCOMYCIN 1 SENSITIVE Sensitive     TRIMETH/SULFA <=10 SENSITIVE Sensitive     CLINDAMYCIN <=0.25 SENSITIVE Sensitive     RIFAMPIN <=0.5 SENSITIVE Sensitive     Inducible Clindamycin NEGATIVE Sensitive     * RARE STAPHYLOCOCCUS AUREUS     Medications:   . sodium chloride   Intravenous Once  . chlorhexidine gluconate (MEDLINE KIT)  15 mL Mouth Rinse BID  . clonazePAM  2 mg Per Tube BID  . docusate  100 mg Per Tube BID  . famotidine  20 mg Per Tube Daily  . feeding supplement (PRO-STAT SUGAR FREE 64)  60 mL Per Tube TID  . feeding supplement (VITAL AF 1.2 CAL)  1,000 mL Per Tube Q24H  . heparin  7,500 Units Subcutaneous Q8H  .  HYDROmorphone (DILAUDID) injection  1 mg Intravenous Once  . insulin aspart  0-15 Units Subcutaneous Q4H  . insulin aspart  6 Units Subcutaneous Q4H  . insulin glargine  8 Units Subcutaneous Daily  . mouth rinse  15 mL Mouth Rinse 10 times per day  . oxyCODONE  10 mg Oral Q4H  .  pantoprazole sodium  40 mg Per Tube BID  . QUEtiapine  50 mg Oral BID  . sennosides  5 mL Oral BID   Continuous Infusions: .  prismasol BGK 4/2.5 500 mL/hr at 03/12/19 0404  .  prismasol BGK 4/2.5 300 mL/hr at 03/12/19 0641  . sodium chloride    . sodium chloride 10 mL/hr at 03/12/19 0700  . cefTRIAXone (ROCEPHIN)  IV Stopped (03/11/19 1210)  . heparin 10,000 units/ 20 mL infusion syringe 500 Units/hr (03/12/19 0700)  .  HYDROmorphone 1.5 mg/hr (03/12/19 0700)  . norepinephrine (LEVOPHED) Adult infusion Stopped (03/10/19 0825)  . prismasol BGK 4/2.5 1,500 mL/hr at 03/12/19 0435     LOS: 12 days   Charlynne Cousins  [facility]  03/12/2019, 7:40 AM

## 2019-03-12 NOTE — Progress Notes (Signed)
NAME:  Jonathan [name], MRN:  [id number], DOB:  [birth year], LOS: 68 ADMISSION DATE:  02/23/2019, CONSULTATION DATE: February 23, 2019 REFERRING MD: Morton Amy, CHIEF COMPLAINT: Dyspnea  Brief History   48 year old male admitted on February 23, 2019 with ARDS due to COVID-19.  Has had severe ARDS required prone ventilation for many days, developed acute kidney injury requiring continuous renal replacement therapy.  Past Medical History  Diabetes mellitus  Significant Hospital Events   4/21 admitted and immediately proned on inverse ratio PC ventilation with high PEEP to maintain saturation >85%, Started on NO at 40ppm. 4/22- Tocluzimab X 1 4/22- started CVVHD 4/21-4/24 Proned daily 4/28 -4/30 proned daily , tapered NO to off  4/30 -late evening with increased O2 demands . No paralytics required x 24hr . No vent dysyncrony . Calm on Fent /Versed. Off precedex   5/1 - Remains on CRRT , no acute issues overnight --4.8 L I/O Balance since admit . Continues to Prone on/off 16 on and 8 off . -CXR stable diffuse bilateral infiltrates (slightly improved aeration ) . , Vent .80Fio2 .  5/3 convalescent plasma  Biomarkes improving but severe ARDS persists ad D-dimer still very high (13) without improvement. On IV Heparin gtt. Growing MSSA. SEdation changed to dilaudid gtt. Continues prone 16h and 8h syupine. Currently supine at 80% fio2, peep 18. On TF. STARTED NIMBEX  5/4-5/5 -able to stay in the supine position, able to discontinue paralysis and maintain FiO2 0.50, PEEP 8-10.   5/5 hematochezia, bloody gastric aspirate  Consults:  PCCM 4/21  Procedures:  4/21 - ETT 7.5 >> 4/21 - RIJ CVC [facility] >> 4/22 L IJ HD Cath>   Significant Diagnostic Tests:  POC U/S 4/21: Bilateral interstitial pattern with bilateral dense consolidation at bases. Echo shows low-normal LVSF. RV moderately dilated with septal shift.   Micro Data:  Blood cultures -PND SARS-CoV2 02/23/2019 - - POSITIVE HIV  4/21 -neg Quant gold 4/2 1- neg .....................Marland Kitchen resp 4/29 >>rare MSSA and yeast   Anti-covid RX and antimicrobial RX  Azithromycin 4/21 >>4/25 Ceftriaxone 4/22 >>4/25 Actemra 4/22 Hydroxychloroquine 4/21 > 4/22 ........................... Ceftriaxone 5/2 >>    Interim history/subjective:  Weaned on mechanical ventilation yesterday Not moving around much  Objective   Blood pressure (!) 140/97, pulse (!) 112, temperature 98.1 F (36.7 C), temperature source Oral, resp. rate 15, height 5\' 5"  (1.651 m), weight 70.7 kg, SpO2 94 %. CVP:  [0 mmHg-11 mmHg] 0 mmHg  Vent Mode: PRVC FiO2 (%):  [40 %] 40 % Set Rate:  [30 bmp] 30 bmp Vt Set:  [360 mL] 360 mL PEEP:  [5 cmH20] 5 cmH20 Pressure Support:  [5 cmH20] 5 cmH20   Intake/Output Summary (Last 24 hours) at 03/12/2019 0724 Last data filed at 03/12/2019 0700 Gross per 24 hour  Intake 1432.25 ml  Output 3368 ml  Net -1935.75 ml   Filed Weights   03/10/19 2000 03/11/19 0507 03/12/19 0500  Weight: 72.4 kg 72.1 kg 70.7 kg    Examination:  General:  In bed on vent HENT: NCAT ETT in place PULM: CTA B, vent supported breathing CV: RRR, no mgr GI: BS+, soft, nontender MSK: normal bulk and tone Neuro: sedated on vent   Chest x-ray images independently reviewed showing improving bilateral infiltrates, support apparatus in place including hemodialysis catheter and endotracheal tube  Resolved Hospital Problem list     Assessment & Plan:  ARDS due to COVID-19: improved oxygenation Continue pressure support as long as tolerated today Hold  sedation Stop clonazepam Stop Dilaudid infusion Continue Seroquel Maintain PEEP and FiO2 as necessary to keep SaO2 greater than 88% Ventilator associated pneumonia prevention protocol Continue volume removal with hemodialysis  Acute kidney injury: remains oliguric CVVHD per nephrology Consider PermCath placement  MSSA pneumonia: Continue ceftriaxone  Need for sedation for  ventilator synchrony: At this point can decrease sedation Stop clonazepam Stop Dilaudid infusion Continue Seroquel, oxycodone  Best practice:  Diet: tube feeding Pain/Anxiety/Delirium protocol (if indicated): yes, as above order PAD protocol dilaudid infusion, RASS goal 0 to -1 VAP protocol (if indicated): yes DVT prophylaxis: sub cutaneous heparin GI prophylaxis: PPI, H2 blocker Glucose control: monitor  Mobility: bed rest, start passive range of motion Code Status: partial code, no CPR Family Communication: updated family by phone on 5/8 Disposition: remain in ICU  Labs   CBC: Recent Labs  Lab 03/08/19 2334 03/09/19 0356  03/10/19 0400 03/10/19 1119 03/11/19 0243 03/11/19 0535 03/12/19 0223  WBC 15.2* 16.6*  --  13.0*  --  14.6*  --  14.5*  HGB 9.5* 9.2*   < > 8.0* 8.2* 8.2* 8.2* 8.7*  HCT 31.2* 29.9*   < > 25.9* 24.0* 27.2* 24.0* 28.4*  MCV 96.3 96.8  --  98.1  --  98.2  --  98.6  PLT 150 158  --  134*  --  167  --  158   < > = values in this interval not displayed.    Basic Metabolic Panel: Recent Labs  Lab 03/08/19 0313  03/09/19 0356 03/09/19 1600 03/10/19 0400 03/10/19 1119 03/10/19 1750 03/11/19 0243 03/11/19 0535 03/12/19 0223  NA 136   < > 134* 134* 135 136 135 135 134* 133*  K 3.9   < > 4.2 4.3 4.4 4.1 4.1 4.0 4.1 4.3  CL 104   < > 100 100 99  --  101 101  --  99  CO2 22   < > 22 23 23   --  24 24  --  25  GLUCOSE 153*   < > 108* 137* 183*  --  137* 129*  --  162*  BUN 41*   < > 41* 43* 43*  --  53* 51*  --  49*  CREATININE 1.81*   < > 2.06* 2.15* 2.56*  --  2.99* 2.69*  --  2.29*  CALCIUM 8.7*   < > 8.3* 8.6* 8.9  --  9.0 9.0  --  9.1  MG 2.6*  --  2.6*  --  2.6*  --   --  2.8*  --  2.8*  PHOS 2.6   < > 2.9 2.5 2.1*  --  4.2 3.6  3.6  --  3.6   < > = values in this interval not displayed.   GFR: Estimated Creatinine Clearance: 34.3 mL/min (A) (by C-G formula based on SCr of 2.29 mg/dL (H)). Recent Labs  Lab 03/08/19 0000  03/09/19 0356  03/10/19 0400 03/11/19 0243 03/12/19 0223  WBC  --    < > 16.6* 13.0* 14.6* 14.5*  LATICACIDVEN 1.2  --   --   --   --   --    < > = values in this interval not displayed.    Liver Function Tests: Recent Labs  Lab 03/08/19 0313  03/09/19 1600 03/10/19 0400 03/10/19 1750 03/11/19 0243 03/12/19 0223  AST 26  --   --   --   --   --   --   ALT 29  --   --   --   --   --   --  ALKPHOS 112  --   --   --   --   --   --   BILITOT 0.6  --   --   --   --   --   --   PROT 6.5  --   --   --   --   --   --   ALBUMIN 2.7*  2.6*   < > 2.5* 2.9* 3.3* 3.3* 3.6   < > = values in this interval not displayed.   No results for input(s): LIPASE, AMYLASE in the last 168 hours. No results for input(s): AMMONIA in the last 168 hours.  ABG    Component Value Date/Time   PHART 7.334 (L) 03/11/2019 0535   PCO2ART 47.7 03/11/2019 0535   PO2ART 67.0 (L) 03/11/2019 0535   HCO3 25.2 03/11/2019 0535   TCO2 27 03/11/2019 0535   ACIDBASEDEF 1.0 03/11/2019 0535   O2SAT 91.0 03/11/2019 0535     Coagulation Profile: Recent Labs  Lab 03/08/19 0313  INR 1.1    Cardiac Enzymes: Recent Labs  Lab 03/06/19 0301 03/07/19 0324 03/08/19 0313  TROPONINI 0.04* <0.03 <0.03    HbA1C: No results found for: HGBA1C  CBG: Recent Labs  Lab 03/11/19 1138 03/11/19 1647 03/11/19 2017 03/11/19 2323 03/12/19 0343  GLUCAP 131* 193* 187* 199* 151*     Critical care time: 35 minutes     Roselie Awkward, MD [facility] Pager: [phone] Cell: [phone] If no response, call [phone]

## 2019-03-12 NOTE — Progress Notes (Signed)
[facility] Progress Note    Assessment/ Plan:   Pt is a48 y.o.yo maleCOVID, cardiogenic shock, hypoxia, VDRF, AKI on CRRT.  1) Anuric AKI due to ATN, cardiogenic shock: On CRRT since 4/22, continue. Discussed with nurse, tolerating UF goal ~100 cc an hour net UF. Currently on 4K bath and fortunately potassium is in the acceptable range. Heparin for anticoagulation, no issue with the filter. Has IJ catheter.  -prefilter/postfilter/dialysate 500/300/1500  -US renalwith no hydronephrosis. -No sign of renal recovery (bladder scan only 72m). Patient will need to continue renal replacement therapy w/ CRRT -> continue UF net of 1055mhr (not on pressors)  I&O/24 hr net -> -1936 ml  Net I&O during hospitalization  is +2286  2) COVID-19, ARDS, hypoxic respiratory failure on mechanical ventilation: Received plasma. Per PCCM.  3) Anemia in critical illness: Transfusion as needed.  4) Encephalopathy  Subjective:   No events overnight. Not on pressors.   Objective:   BP (!) 140/97   Pulse (!) 125   Temp 98.1 F (36.7 C) (Oral)   Resp (!) 31   Ht '5\' 5"'  (1.651 m)   Wt 70.7 kg   SpO2 93%   BMI 25.94 kg/m   Intake/Output Summary (Last 24 hours) at 03/12/2019 1118 Last data filed at 03/12/2019 0700 Gross per 24 hour  Intake 1157.96 ml  Output 2775 ml  Net -1617.04 ml   Weight change: -1.7 kg  Physical Exam: D/W nursing staff: edema is present but not overwhelming P5 and FIO2 of 40%   Imaging: Dg Chest Port 1 View  Result Date: 03/11/2019 CLINICAL DATA:  Respiratory failure. EXAM: PORTABLE CHEST 1 VIEW COMPARISON:  03/10/2019 FINDINGS: Endotracheal tube tip approximately 2.5 cm above the carina. Bilateral central line and gastric decompression tube positioning stable. Bilateral airspace disease again noted with some improved expansion both lungs and improved aeration of the left lower lobe. No pneumothorax or significant pleural fluid. The heart size  is stable. IMPRESSION: Improved expansion of both lungs with improved aeration of the left lower lobe. Electronically Signed   By: GlAletta Edouard.D.   On: 03/11/2019 07:44    Labs: BMET Recent Labs  Lab 03/08/19 1547 03/09/19 0356 03/09/19 1600 03/10/19 0400 03/10/19 1119 03/10/19 1750 03/11/19 0243 03/11/19 0535 03/12/19 0223  NA 135 134* 134* 135 136 135 135 134* 133*  K 3.9 4.2 4.3 4.4 4.1 4.1 4.0 4.1 4.3  CL 100 100 100 99  --  101 101  --  99  CO2 '26 22 23 23  ' --  24 24  --  25  GLUCOSE 133* 108* 137* 183*  --  137* 129*  --  162*  BUN 30* 41* 43* 43*  --  53* 51*  --  49*  CREATININE 1.32* 2.06* 2.15* 2.56*  --  2.99* 2.69*  --  2.29*  CALCIUM 8.5* 8.3* 8.6* 8.9  --  9.0 9.0  --  9.1  PHOS 1.7* 2.9 2.5 2.1*  --  4.2 3.6  3.6  --  3.6   CBC Recent Labs  Lab 03/09/19 0356  03/10/19 0400 03/10/19 1119 03/11/19 0243 03/11/19 0535 03/12/19 0223  WBC 16.6*  --  13.0*  --  14.6*  --  14.5*  HGB 9.2*   < > 8.0* 8.2* 8.2* 8.2* 8.7*  HCT 29.9*   < > 25.9* 24.0* 27.2* 24.0* 28.4*  MCV 96.8  --  98.1  --  98.2  --  98.6  PLT 158  --  134*  --  167  --  158   < > = values in this interval not displayed.    Medications:    . sodium chloride   Intravenous Once  . chlorhexidine gluconate (MEDLINE KIT)  15 mL Mouth Rinse BID  . clonazePAM  2 mg Per Tube BID  . docusate  100 mg Per Tube BID  . famotidine  20 mg Per Tube Daily  . feeding supplement (PRO-STAT SUGAR FREE 64)  60 mL Per Tube TID  . feeding supplement (VITAL AF 1.2 CAL)  1,000 mL Per Tube Q24H  . heparin  7,500 Units Subcutaneous Q8H  . hydrALAZINE  25 mg Oral Q8H  . insulin aspart  0-15 Units Subcutaneous Q4H  . insulin aspart  6 Units Subcutaneous Q4H  . insulin glargine  8 Units Subcutaneous Daily  . mouth rinse  15 mL Mouth Rinse 10 times per day  . oxyCODONE  10 mg Oral Q4H  . pantoprazole sodium  40 mg Per Tube BID  . QUEtiapine  50 mg Oral BID  . sennosides  5 mL Oral BID      Otelia Santee,  MD 03/12/2019, 11:18 AM

## 2019-03-13 LAB — RENAL FUNCTION PANEL
Albumin: 3.3 g/dL — ABNORMAL LOW (ref 3.5–5.0)
Anion gap: 12 (ref 5–15)
BUN: 42 mg/dL — ABNORMAL HIGH (ref 6–20)
CO2: 22 mmol/L (ref 22–32)
Calcium: 9.1 mg/dL (ref 8.9–10.3)
Chloride: 100 mmol/L (ref 98–111)
Creatinine, Ser: 2.12 mg/dL — ABNORMAL HIGH (ref 0.61–1.24)
GFR calc Af Amer: 41 mL/min — ABNORMAL LOW (ref >60)
GFR calc non Af Amer: 36 mL/min — ABNORMAL LOW (ref >60)
Glucose, Bld: 187 mg/dL — ABNORMAL HIGH (ref 70–99)
Phosphorus: 2.8 mg/dL (ref 2.5–4.6)
Potassium: 4.8 mmol/L (ref 3.5–5.1)
Sodium: 134 mmol/L — ABNORMAL LOW (ref 135–145)

## 2019-03-13 LAB — HAPTOGLOBIN: Haptoglobin: 269 mg/dL (ref 23–355)

## 2019-03-13 LAB — GLUCOSE, CAPILLARY
Glucose-Capillary: 188 mg/dL — ABNORMAL HIGH (ref 70–99)
Glucose-Capillary: 172 mg/dL — ABNORMAL HIGH (ref 70–99)
Glucose-Capillary: 159 mg/dL — ABNORMAL HIGH (ref 70–99)
Glucose-Capillary: 205 mg/dL — ABNORMAL HIGH (ref 70–99)
Glucose-Capillary: 196 mg/dL — ABNORMAL HIGH (ref 70–99)

## 2019-03-13 LAB — D-DIMER, QUANTITATIVE: D-Dimer, Quant: 5.51 ug/mL-FEU — ABNORMAL HIGH (ref 0.00–0.50)

## 2019-03-13 LAB — MAGNESIUM: Magnesium: 3 mg/dL — ABNORMAL HIGH (ref 1.7–2.4)

## 2019-03-13 MED ORDER — INSULIN GLARGINE 100 UNIT/ML ~~LOC~~ SOLN
10.0000 [IU] | Freq: Two times a day (BID) | SUBCUTANEOUS | Status: DC
  Administered 2019-03-13 – 2019-03-14 (×2): 10 [IU] via SUBCUTANEOUS
  Filled 2019-03-13 (×2): qty 0.1

## 2019-03-13 MED ORDER — HEPARIN (PORCINE) 2000 UNITS/L FOR CRRT
INTRAVENOUS_CENTRAL | Status: DC | PRN
  Filled 2019-03-13 (×2): qty 1000

## 2019-03-13 MED ORDER — SODIUM CHLORIDE 0.9% FLUSH
10.0000 mL | INTRAVENOUS | Status: DC | PRN
  Administered 2019-04-04 – 2019-04-11 (×2): 10 mL
  Filled 2019-03-13 (×2): qty 40

## 2019-03-13 MED ORDER — SODIUM CHLORIDE 0.9 % IV SOLN
INTRAVENOUS | Status: DC | PRN
  Filled 2019-03-13: qty 1000

## 2019-03-13 MED ORDER — INSULIN GLARGINE 100 UNIT/ML ~~LOC~~ SOLN: 12.0000 [IU] | Freq: Two times a day (BID) | SUBCUTANEOUS | Status: DC

## 2019-03-13 MED ORDER — INSULIN ASPART 100 UNIT/ML ~~LOC~~ SOLN
6.0000 [IU] | SUBCUTANEOUS | Status: DC
  Administered 2019-03-13 – 2019-04-08 (×125): 6 [IU] via SUBCUTANEOUS

## 2019-03-13 MED ORDER — SODIUM CHLORIDE 0.9% FLUSH
10.0000 mL | Freq: Two times a day (BID) | INTRAVENOUS | Status: DC
  Administered 2019-03-13: 11:00:00 10 mL
  Administered 2019-03-14: 21:00:00 20 mL
  Administered 2019-03-14: 17:00:00 40 mL
  Administered 2019-03-15 – 2019-04-04 (×26): 10 mL
  Administered 2019-04-05: 20 mL
  Administered 2019-04-05 – 2019-04-16 (×21): 10 mL
  Administered 2019-04-16: 20 mL
  Administered 2019-04-17 – 2019-04-18 (×4): 10 mL

## 2019-03-13 MED ORDER — CHLORHEXIDINE GLUCONATE CLOTH 2 % EX PADS
6.0000 | MEDICATED_PAD | Freq: Every day | CUTANEOUS | Status: DC
  Administered 2019-03-13 – 2019-04-05 (×21): 6 via TOPICAL

## 2019-03-13 MED ORDER — HEPARIN (PORCINE) 2000 UNITS/L FOR CRRT
INTRAVENOUS_CENTRAL | Status: DC | PRN
  Filled 2019-03-13: qty 1000

## 2019-03-13 NOTE — Progress Notes (Signed)
[facility] PROGRESS NOTE    Progress Note  Jonathan [name]  GHW:[id number] DOB: 1971/10/28 DOA: 02/23/2019 PCP: Patient, No Pcp Per     Brief Narrative:   Jonathan [name] is an 48 y.o. male noninjected speaker arrived from [place] on 02/13/2019 lesion close quarters with 6 of the workers came in in respiratory distress intubated for ARDS due to COVID-19 infection started on CRRT, paralyzed and prone.  Medications: 4/22- Tocluzimab X 1 03/12/2019 on intermediate dose of IV heparin.  Events: 02/23/2019 admitted to the ICU. 02/24/2019 started on CVVHD 4/21-4/24 Proned daily 4/28 -4/30 proned daily  03/04/2019 increase in oxygen demand require paralytic for 24 hours. 03/09/2019 hematochezia from gastric aspirate  Lines and tubes: 4/21 - ETT 7.5 >> 4/21 - RIJ CVC [facility] >> 4/22 L IJ HD Cath>    Assessment/Plan:   Acute respiratory failure with hypoxia/COVID-19/  ARDS (adult respiratory distress syndrome) ([place]) Continue vent management per  PCCM on ARDS HD protocol Paralytic stop on 03/09/2019. He received Acterma on 02/24/2019 Received convalescent plasma on 03/07/2019 He has remained afebrile. Fluid balance per renal.  ATN: Continue CVVHD per renal. Permanent dialysis catheter on 03/15/2019  Anemia due to chronic illness and possibly blood loss bleed: Hemoglobin today 10.2, with a stable hemoglobin. Continue IV Protonix  Elevated d-dimer: Likely related to inflammatory state in the setting of hypercoagulable state.  He was initially on IV heparin which was stopped after GI bleed. Fibrinogen greater than 800, haptoglobin is pending unlikely DIC. D-dimer is pending.  Pressure injury of skin on the nose:  Obese:    DVT prophylaxis: Heparin Family Communication:none Disposition Plan/Barrier to D/C: unable to determine Code Status:     Code Status Orders  (From admission, onward)         Start     Ordered   03/02/19 1658  Limited resuscitation  (code)  Continuous    Question Answer Comment  In the event of cardiac or respiratory ARREST: Initiate Code Blue, Call Rapid Response No   In the event of cardiac or respiratory ARREST: Perform CPR No   In the event of cardiac or respiratory ARREST: Perform Intubation/Mechanical Ventilation Yes   In the event of cardiac or respiratory ARREST: Use NIPPV/BiPAp only if indicated Yes   In the event of cardiac or respiratory ARREST: Administer ACLS medications if indicated Yes   In the event of cardiac or respiratory ARREST: Perform Defibrillation or Cardioversion if indicated Yes      03/02/19 1657        Code Status History    Date Active Date Inactive Code Status Order ID Comments User Context   02/23/2019 1051 03/02/2019 1657 Full Code [id number]  Kipp Brood, MD ED        IV Access:    Peripheral IV   Procedures and diagnostic studies:   No results found.   Medical Consultants:    None.  Anti-Infectives:   Azithromycin 4/21 >>4/25 Ceftriaxone 4/22 >>4/25 Actemra 4/22 Hydroxychloroquine 4/21 > 4/22  Subjective:    [name] intubated, mildly sedated able to open his eyes on command  Objective:    Vitals:   03/13/19 0500 03/13/19 0543 03/13/19 0600 03/13/19 0700  BP: 105/73  121/78 117/84  Pulse: (!) 127  (!) 128 (!) 120  Resp: (!) '30  15 14  ' Temp:      TempSrc:      SpO2: 98% 98% 97% 98%  Weight: 65.2 kg     Height:  '5\' 5"'  (1.651 m)       Intake/Output Summary (Last 24 hours) at 03/13/2019 0733 Last data filed at 03/13/2019 0700 Gross per 24 hour  Intake 1789.26 ml  Output 3817 ml  Net -2027.74 ml   Filed Weights   03/11/19 0507 03/12/19 0500 03/13/19 0500  Weight: 72.1 kg 70.7 kg 65.2 kg    Exam: General exam: Intubated in supine position. Respiratory system: Good air movement with crackles at bases Cardiovascular system: Rate and rhythm with positive S1-S2 no murmurs rubs gallops. Gastrointestinal system: Bowel sounds soft  nontender nondistended. Extremities: Trace edema. Skin: No rashes, pressure ulcer on his nasolabial fold   Data Reviewed:    Labs: Basic Metabolic Panel: Recent Labs  Lab 03/09/19 0356  03/10/19 0400  03/10/19 1750 03/11/19 0243 03/11/19 0535 03/12/19 0223 03/12/19 1649 03/13/19 0200  NA 134*   < > 135   < > 135 135 134* 133* 135 134*  K 4.2   < > 4.4   < > 4.1 4.0 4.1 4.3 4.6 4.8  CL 100   < > 99  --  101 101  --  99 100 100  CO2 22   < > 23  --  24 24  --  25  --  22  GLUCOSE 108*   < > 183*  --  137* 129*  --  162* 162* 187*  BUN 41*   < > 43*  --  53* 51*  --  49* 30* 42*  CREATININE 2.06*   < > 2.56*  --  2.99* 2.69*  --  2.29* 1.60* 2.12*  CALCIUM 8.3*   < > 8.9  --  9.0 9.0  --  9.1  --  9.1  MG 2.6*  --  2.6*  --   --  2.8*  --  2.8*  --  3.0*  PHOS 2.9   < > 2.1*  --  4.2 3.6  3.6  --  3.6  --  2.8   < > = values in this interval not displayed.   GFR Estimated Creatinine Clearance: 37.1 mL/min (A) (by C-G formula based on SCr of 2.12 mg/dL (H)). Liver Function Tests: Recent Labs  Lab 03/08/19 0313  03/10/19 0400 03/10/19 1750 03/11/19 0243 03/12/19 0223 03/13/19 0200  AST 26  --   --   --   --   --   --   ALT 29  --   --   --   --   --   --   ALKPHOS 112  --   --   --   --   --   --   BILITOT 0.6  --   --   --   --   --   --   PROT 6.5  --   --   --   --   --   --   ALBUMIN 2.7*  2.6*   < > 2.9* 3.3* 3.3* 3.6 3.3*   < > = values in this interval not displayed.   No results for input(s): LIPASE, AMYLASE in the last 168 hours. No results for input(s): AMMONIA in the last 168 hours. Coagulation profile Recent Labs  Lab 03/08/19 0313  INR 1.1   COVID-19 Labs  Recent Labs    03/12/19 0915  LDH 386*    Lab Results  Component Value Date   SARSCOV2NAA POSITIVE (A) 02/23/2019    CBC: Recent Labs  Lab 03/08/19 2334 03/09/19 0356  03/10/19 0400 03/10/19 1119 03/11/19 0243 03/11/19 0535 03/12/19 0223 03/12/19 1649  WBC 15.2* 16.6*  --   13.0*  --  14.6*  --  14.5*  --   HGB 9.5* 9.2*   < > 8.0* 8.2* 8.2* 8.2* 8.7* 10.2*  HCT 31.2* 29.9*   < > 25.9* 24.0* 27.2* 24.0* 28.4* 30.0*  MCV 96.3 96.8  --  98.1  --  98.2  --  98.6  --   PLT 150 158  --  134*  --  167  --  158  --    < > = values in this interval not displayed.   Cardiac Enzymes: Recent Labs  Lab 03/07/19 0324 03/08/19 0313  TROPONINI <0.03 <0.03   BNP (last 3 results) No results for input(s): PROBNP in the last 8760 hours. CBG: Recent Labs  Lab 03/12/19 0855 03/12/19 1212 03/12/19 1951 03/12/19 2317 03/13/19 0330  GLUCAP 139* 188* 188* 223* 188*   D-Dimer: No results for input(s): DDIMER in the last 72 hours. Hgb A1c: No results for input(s): HGBA1C in the last 72 hours. Lipid Profile: No results for input(s): CHOL, HDL, LDLCALC, TRIG, CHOLHDL, LDLDIRECT in the last 72 hours. Thyroid function studies: No results for input(s): TSH, T4TOTAL, T3FREE, THYROIDAB in the last 72 hours.  Invalid input(s): FREET3 Anemia work up: No results for input(s): VITAMINB12, FOLATE, FERRITIN, TIBC, IRON, RETICCTPCT in the last 72 hours. Sepsis Labs: Recent Labs  Lab 03/08/19 0000  03/09/19 0356 03/10/19 0400 03/11/19 0243 03/12/19 0223  WBC  --    < > 16.6* 13.0* 14.6* 14.5*  LATICACIDVEN 1.2  --   --   --   --   --    < > = values in this interval not displayed.   Microbiology Recent Results (from the past 240 hour(s))  Culture, respiratory (non-expectorated)     Status: None   Collection Time: 03/03/19 11:44 AM  Result Value Ref Range Status   Specimen Description TRACHEAL ASPIRATE  Final   Special Requests NONE  Final   Gram Stain   Final    FEW WBC PRESENT, PREDOMINANTLY PMN NO ORGANISMS SEEN Performed at [facility], [address] [place], [place] [address]    Culture   Final    RARE STAPHYLOCOCCUS AUREUS RARE CANDIDA ALBICANS    Report Status 03/06/2019 FINAL  Final   Organism ID, Bacteria STAPHYLOCOCCUS AUREUS  Final       Susceptibility   Staphylococcus aureus - MIC*    CIPROFLOXACIN <=0.5 SENSITIVE Sensitive     ERYTHROMYCIN <=0.25 SENSITIVE Sensitive     GENTAMICIN <=0.5 SENSITIVE Sensitive     OXACILLIN 0.5 SENSITIVE Sensitive     TETRACYCLINE <=1 SENSITIVE Sensitive     VANCOMYCIN 1 SENSITIVE Sensitive     TRIMETH/SULFA <=10 SENSITIVE Sensitive     CLINDAMYCIN <=0.25 SENSITIVE Sensitive     RIFAMPIN <=0.5 SENSITIVE Sensitive     Inducible Clindamycin NEGATIVE Sensitive     * RARE STAPHYLOCOCCUS AUREUS     Medications:   . sodium chloride   Intravenous Once  . chlorhexidine gluconate (MEDLINE KIT)  15 mL Mouth Rinse BID  . Chlorhexidine Gluconate Cloth  6 each Topical Daily  . docusate  100 mg Per Tube BID  . famotidine  20 mg Per Tube Daily  . feeding supplement (PRO-STAT SUGAR FREE 64)  60 mL Per Tube TID  . feeding supplement (VITAL AF 1.2 CAL)  1,000 mL Per Tube Q24H  . heparin  7,500 Units Subcutaneous Q8H  . hydrALAZINE  25 mg Oral Q8H  . insulin aspart  0-15 Units Subcutaneous Q4H  . insulin aspart  6 Units Subcutaneous Q4H  . insulin glargine  8 Units Subcutaneous Daily  . mouth rinse  15 mL Mouth Rinse 10 times per day  . oxyCODONE  10 mg Oral Q6H  . QUEtiapine  50 mg Oral BID  . sennosides  5 mL Oral BID  . sodium chloride flush  10-40 mL Intracatheter Q12H   Continuous Infusions: .  prismasol BGK 4/2.5 500 mL/hr at 03/13/19 0157  .  prismasol BGK 4/2.5 300 mL/hr at 03/13/19 0102  . sodium chloride    . sodium chloride 10 mL/hr at 03/13/19 0700  . sodium chloride 10 mL/hr at 03/13/19 0700  . heparin 10,000 units/ 20 mL infusion syringe 500 Units/hr (03/13/19 0153)  . HYDROmorphone 1 mg/hr (03/13/19 0700)  . norepinephrine (LEVOPHED) Adult infusion Stopped (03/10/19 0825)  . prismasol BGK 4/2.5 1,500 mL/hr at 03/13/19 9941     LOS: 18 days   [facility]  03/13/2019, 7:33 AM

## 2019-03-13 NOTE — Progress Notes (Signed)
Talked to Dr.Ikard about patient breath stacking while on the ventilator. Increased VT to 490 which is 56ml/kg keeping plateau pressures less than 30cm.

## 2019-03-13 NOTE — Progress Notes (Signed)
NAME:  Jonathan [name], MRN:  [id number], DOB:  02-Dec-1970, LOS: 67 ADMISSION DATE:  02/23/2019, CONSULTATION DATE: February 23, 2019 REFERRING MD: Morton Amy, CHIEF COMPLAINT: Dyspnea  Brief History   48 year old male admitted on February 23, 2019 with ARDS due to COVID-19.  Has had severe ARDS required prone ventilation for many days, developed acute kidney injury requiring continuous renal replacement therapy.  Past Medical History  Diabetes mellitus  Significant Hospital Events   4/21 admitted and immediately proned on inverse ratio PC ventilation with high PEEP to maintain saturation >85%, Started on NO at 40ppm. 4/22- Tocluzimab X 1 4/22- started CVVHD 4/21-4/24 Proned daily 4/28 -4/30 proned daily , tapered NO to off  4/30 -late evening with increased O2 demands . No paralytics required x 24hr . No vent dysyncrony . Calm on Fent /Versed. Off precedex   5/1 - Remains on CRRT , no acute issues overnight --4.8 L I/O Balance since admit . Continues to Prone on/off 16 on and 8 off . -CXR stable diffuse bilateral infiltrates (slightly improved aeration ) . , Vent .80Fio2 .  5/3 convalescent plasma  Biomarkes improving but severe ARDS persists ad D-dimer still very high (13) without improvement. On IV Heparin gtt. Growing MSSA. SEdation changed to dilaudid gtt. Continues prone 16h and 8h syupine. Currently supine at 80% fio2, peep 18. On TF. STARTED NIMBEX  5/4-5/5 -able to stay in the supine position, able to discontinue paralysis and maintain FiO2 0.50, PEEP 8-10.   5/5 hematochezia, bloody gastric aspirate  5/8 followed commands  Consults:  PCCM 4/21  Procedures:  4/21 - ETT 7.5 >> 4/21 - RIJ CVC [facility] >> 4/22 L IJ HD Cath>   Significant Diagnostic Tests:  POC U/S 4/21: Bilateral interstitial pattern with bilateral dense consolidation at bases. Echo shows low-normal LVSF. RV moderately dilated with septal shift.   Micro Data:  Blood cultures -PND SARS-CoV2  02/23/2019 - - POSITIVE HIV 4/21 -neg Quant gold 4/2 1- neg .....................Marland Kitchen resp 4/29 >>rare MSSA and yeast   Anti-covid RX and antimicrobial RX  Azithromycin 4/21 >>4/25 Ceftriaxone 4/22 >>4/25 Actemra 4/22 Hydroxychloroquine 4/21 > 4/22 ........................... Ceftriaxone 5/2 >>    Interim history/subjective:   Minimal urine output Lots of coughing yesterday Was off dilaudid this morning Lots of agitation, tachycardia yesterday with stimulation+  Objective   Blood pressure 117/84, pulse (!) 120, temperature 98.8 F (37.1 C), temperature source Axillary, resp. rate 14, height 5\' 5"  (1.651 m), weight 65.2 kg, SpO2 98 %. CVP:  [2 mmHg-3 mmHg] 2 mmHg  Vent Mode: PSV FiO2 (%):  [40 %-50 %] 40 % Set Rate:  [30 bmp] 30 bmp Vt Set:  [360 mL-490 mL] 490 mL PEEP:  [5 cmH20] 5 cmH20 Pressure Support:  [15 cmH20-20 cmH20] 15 cmH20 Plateau Pressure:  [20 cmH20-24 cmH20] 24 cmH20   Intake/Output Summary (Last 24 hours) at 03/13/2019 0745 Last data filed at 03/13/2019 0700 Gross per 24 hour  Intake 1789.26 ml  Output 3817 ml  Net -2027.74 ml   Filed Weights   03/11/19 0507 03/12/19 0500 03/13/19 0500  Weight: 72.1 kg 70.7 kg 65.2 kg    Examination:  General:  In bed on vent HENT: NCAT ETT in place PULM: CTA B, vent supported breathing CV: RRR, no mgr GI: BS+, soft, nontender MSK: normal bulk and tone Neuro: sedated on vent   Chest x-ray images independently reviewed showing improving bilateral infiltrates, support apparatus in place including hemodialysis catheter and endotracheal tube  [facility]  Problem list     Assessment & Plan:  ARDS due to COVID-19: improved oxygenation Continue weaning attempts daily  May need to consider tracheostomy by day 21 Continue to wean sedation as able, RASS goal 0 to -1 VAP prevention Maintain PEEP/FiO2 to keep SaO2 > 88% Hold volume removal  Acute kidney injury: remains oliguric Need to consider permcath  placement next week Continue CVVHD per nephrology  MSSA pneumonia: Continue ceftriaxone  Need for sedation for ventilator synchrony: At this point can decrease sedation Continue seroquel, oxycodone Low dose dilaudid infusion   Best practice:  Diet: tube feeding Pain/Anxiety/Delirium protocol (if indicated): yes, as above order PAD protocol dilaudid infusion, RASS goal 0 to -1 VAP protocol (if indicated): yes DVT prophylaxis: sub cutaneous heparin GI prophylaxis: PPI, H2 blocker Glucose control: monitor  Mobility: bed rest, start passive range of motion Code Status: partial code, no CPR Family Communication: updated family by phone on 5/8 Disposition: remain in ICU  Labs   CBC: Recent Labs  Lab 03/08/19 2334 03/09/19 0356  03/10/19 0400 03/10/19 1119 03/11/19 0243 03/11/19 0535 03/12/19 0223 03/12/19 1649  WBC 15.2* 16.6*  --  13.0*  --  14.6*  --  14.5*  --   HGB 9.5* 9.2*   < > 8.0* 8.2* 8.2* 8.2* 8.7* 10.2*  HCT 31.2* 29.9*   < > 25.9* 24.0* 27.2* 24.0* 28.4* 30.0*  MCV 96.3 96.8  --  98.1  --  98.2  --  98.6  --   PLT 150 158  --  134*  --  167  --  158  --    < > = values in this interval not displayed.    Basic Metabolic Panel: Recent Labs  Lab 03/09/19 0356  03/10/19 0400  03/10/19 1750 03/11/19 0243 03/11/19 0535 03/12/19 0223 03/12/19 1649 03/13/19 0200  NA 134*   < > 135   < > 135 135 134* 133* 135 134*  K 4.2   < > 4.4   < > 4.1 4.0 4.1 4.3 4.6 4.8  CL 100   < > 99  --  101 101  --  99 100 100  CO2 22   < > 23  --  24 24  --  25  --  22  GLUCOSE 108*   < > 183*  --  137* 129*  --  162* 162* 187*  BUN 41*   < > 43*  --  53* 51*  --  49* 30* 42*  CREATININE 2.06*   < > 2.56*  --  2.99* 2.69*  --  2.29* 1.60* 2.12*  CALCIUM 8.3*   < > 8.9  --  9.0 9.0  --  9.1  --  9.1  MG 2.6*  --  2.6*  --   --  2.8*  --  2.8*  --  3.0*  PHOS 2.9   < > 2.1*  --  4.2 3.6  3.6  --  3.6  --  2.8   < > = values in this interval not displayed.   GFR: Estimated  Creatinine Clearance: 37.1 mL/min (A) (by C-G formula based on SCr of 2.12 mg/dL (H)). Recent Labs  Lab 03/08/19 0000  03/09/19 0356 03/10/19 0400 03/11/19 0243 03/12/19 0223  WBC  --    < > 16.6* 13.0* 14.6* 14.5*  LATICACIDVEN 1.2  --   --   --   --   --    < > = values in this interval  not displayed.    Liver Function Tests: Recent Labs  Lab 03/08/19 0313  03/10/19 0400 03/10/19 1750 03/11/19 0243 03/12/19 0223 03/13/19 0200  AST 26  --   --   --   --   --   --   ALT 29  --   --   --   --   --   --   ALKPHOS 112  --   --   --   --   --   --   BILITOT 0.6  --   --   --   --   --   --   PROT 6.5  --   --   --   --   --   --   ALBUMIN 2.7*  2.6*   < > 2.9* 3.3* 3.3* 3.6 3.3*   < > = values in this interval not displayed.   No results for input(s): LIPASE, AMYLASE in the last 168 hours. No results for input(s): AMMONIA in the last 168 hours.  ABG    Component Value Date/Time   PHART 7.334 (L) 03/11/2019 0535   PCO2ART 47.7 03/11/2019 0535   PO2ART 67.0 (L) 03/11/2019 0535   HCO3 25.2 03/11/2019 0535   TCO2 28 03/12/2019 1649   ACIDBASEDEF 1.0 03/11/2019 0535   O2SAT 91.0 03/11/2019 0535     Coagulation Profile: Recent Labs  Lab 03/08/19 0313  INR 1.1    Cardiac Enzymes: Recent Labs  Lab 03/07/19 0324 03/08/19 0313  TROPONINI <0.03 <0.03    HbA1C: No results found for: HGBA1C  CBG: Recent Labs  Lab 03/12/19 0855 03/12/19 1212 03/12/19 1951 03/12/19 2317 03/13/19 0330  GLUCAP 139* 188* 188* 223* 188*     Critical care time: 32 minutes     Roselie Awkward, MD [facility] Pager: [phone] Cell: [phone] If no response, call [phone]

## 2019-03-13 NOTE — Progress Notes (Signed)
Spoke with pt family via [facility] for update and plan of care. Set up time for video chat via elink at 1700. Family in [place] so this allows them to communicate and visualize their loved one. He is following simple commands today per MD at bedside X 3 occations!

## 2019-03-13 NOTE — Progress Notes (Signed)
[facility] Progress Note    Assessment/ Plan:   Pt is a48 y.o.yo maleCOVID, cardiogenic shock, hypoxia, VDRF, AKI on CRRT.  1) Anuric AKI due to ATN, cardiogenic shock: On CRRT since 4/22, continue. Discussed withnurse, toleratingUF goal~100 cc an hournet UF. Currently on 4K bathand fortunately potassium is in the acceptable range. Heparin for anticoagulation, no issue with the filter. Has IJ catheter.  -prefilter/postfilter/dialysate500/300/1500 -US renalwith no hydronephrosis. -No sign of renal recovery (bladder scan only 132m).Patient will needto continuerenal replacement therapyw/ CRRT   -> keep even, more tachy, no edema  -> will hold CRRT on Sunday and then plan to convert to tunneled HD cath mon or tues if there are no signs of renal recovery  I&O/24 hr net -> - 2278 mL  Net I&O during hospitalization  is neg 12.3L  2) COVID-19, ARDS, hypoxic respiratory failure on mechanical ventilation: Received plasma. Per PCCM.  3) Anemia in critical illness: Transfusion as needed.  4) Encephalopathy  Subjective:   Emesis overnight but TF were able to be restarted. Not on pressors.   Objective:   BP 114/73   Pulse (!) 129   Temp 99.7 F (37.6 C) (Oral)   Resp (!) 24   Ht '5\' 5"'  (1.651 m)   Wt 65.2 kg   SpO2 99%   BMI 23.92 kg/m   Intake/Output Summary (Last 24 hours) at 03/13/2019 1044 Last data filed at 03/13/2019 0900 Gross per 24 hour  Intake 1629.26 ml  Output 3682 ml  Net -2052.74 ml   Weight change: -5.5 kg  Physical Exam: P5 and FIO2 of 40% No edema in the lower ext LIJ temp cath for dialysis  Imaging: No results found.  Labs: BMET Recent Labs  Lab 03/09/19 0356 03/09/19 1600 03/10/19 0400 03/10/19 1119 03/10/19 1750 03/11/19 0243 03/11/19 0535 03/12/19 0223 03/12/19 1649 03/13/19 0200  NA 134* 134* 135 136 135 135 134* 133* 135 134*  K 4.2 4.3 4.4 4.1 4.1 4.0 4.1 4.3 4.6 4.8  CL 100 100 99  --   101 101  --  99 100 100  CO2 '22 23 23  ' --  24 24  --  25  --  22  GLUCOSE 108* 137* 183*  --  137* 129*  --  162* 162* 187*  BUN 41* 43* 43*  --  53* 51*  --  49* 30* 42*  CREATININE 2.06* 2.15* 2.56*  --  2.99* 2.69*  --  2.29* 1.60* 2.12*  CALCIUM 8.3* 8.6* 8.9  --  9.0 9.0  --  9.1  --  9.1  PHOS 2.9 2.5 2.1*  --  4.2 3.6  3.6  --  3.6  --  2.8   CBC Recent Labs  Lab 03/09/19 0356  03/10/19 0400  03/11/19 0243 03/11/19 0535 03/12/19 0223 03/12/19 1649  WBC 16.6*  --  13.0*  --  14.6*  --  14.5*  --   HGB 9.2*   < > 8.0*   < > 8.2* 8.2* 8.7* 10.2*  HCT 29.9*   < > 25.9*   < > 27.2* 24.0* 28.4* 30.0*  MCV 96.8  --  98.1  --  98.2  --  98.6  --   PLT 158  --  134*  --  167  --  158  --    < > = values in this interval not displayed.    Medications:    . sodium chloride   Intravenous Once  . chlorhexidine gluconate (  MEDLINE KIT)  15 mL Mouth Rinse BID  . Chlorhexidine Gluconate Cloth  6 each Topical Daily  . docusate  100 mg Per Tube BID  . famotidine  20 mg Per Tube Daily  . feeding supplement (PRO-STAT SUGAR FREE 64)  60 mL Per Tube TID  . feeding supplement (VITAL AF 1.2 CAL)  1,000 mL Per Tube Q24H  . heparin  7,500 Units Subcutaneous Q8H  . hydrALAZINE  25 mg Oral Q8H  . insulin aspart  0-15 Units Subcutaneous Q4H  . insulin aspart  6 Units Subcutaneous Q4H  . insulin glargine  8 Units Subcutaneous Daily  . mouth rinse  15 mL Mouth Rinse 10 times per day  . oxyCODONE  10 mg Oral Q6H  . QUEtiapine  50 mg Oral BID  . sennosides  5 mL Oral BID  . sodium chloride flush  10-40 mL Intracatheter Q12H      Otelia Santee, MD 03/13/2019, 10:44 AM

## 2019-03-14 ENCOUNTER — Inpatient Hospital Stay (HOSPITAL_COMMUNITY): Payer: HRSA Program

## 2019-03-14 ENCOUNTER — Inpatient Hospital Stay: Payer: Self-pay

## 2019-03-14 DIAGNOSIS — N170 Acute kidney failure with tubular necrosis: Secondary | ICD-10-CM

## 2019-03-14 LAB — CBC WITH DIFFERENTIAL/PLATELET
Abs Immature Granulocytes: 1.26 10*3/uL — ABNORMAL HIGH (ref 0.00–0.07)
Basophils Absolute: 0.2 10*3/uL — ABNORMAL HIGH (ref 0.0–0.1)
Basophils Relative: 1 %
Eosinophils Absolute: 0.3 10*3/uL (ref 0.0–0.5)
Eosinophils Relative: 1 %
HCT: 25.4 % — ABNORMAL LOW (ref 39.0–52.0)
Hemoglobin: 8 g/dL — ABNORMAL LOW (ref 13.0–17.0)
Immature Granulocytes: 7 %
Lymphocytes Relative: 17 %
Lymphs Abs: 3.1 10*3/uL (ref 0.7–4.0)
MCH: 31.1 pg (ref 26.0–34.0)
MCHC: 31.5 g/dL (ref 30.0–36.0)
MCV: 98.8 fL (ref 80.0–100.0)
Monocytes Absolute: 2.2 10*3/uL — ABNORMAL HIGH (ref 0.1–1.0)
Monocytes Relative: 13 %
Neutro Abs: 10.8 10*3/uL — ABNORMAL HIGH (ref 1.7–7.7)
Neutrophils Relative %: 61 %
Platelets: 196 10*3/uL (ref 150–400)
RBC: 2.57 MIL/uL — ABNORMAL LOW (ref 4.22–5.81)
RDW: 17.7 % — ABNORMAL HIGH (ref 11.5–15.5)
WBC: 17.8 10*3/uL — ABNORMAL HIGH (ref 4.0–10.5)
nRBC: 0.2 % (ref 0.0–0.2)

## 2019-03-14 LAB — COMPREHENSIVE METABOLIC PANEL
ALT: 30 U/L (ref 0–44)
AST: 29 U/L (ref 15–41)
Albumin: 3.1 g/dL — ABNORMAL LOW (ref 3.5–5.0)
Alkaline Phosphatase: 144 U/L — ABNORMAL HIGH (ref 38–126)
Anion gap: 12 (ref 5–15)
BUN: 63 mg/dL — ABNORMAL HIGH (ref 6–20)
CO2: 21 mmol/L — ABNORMAL LOW (ref 22–32)
Calcium: 9.6 mg/dL (ref 8.9–10.3)
Chloride: 102 mmol/L (ref 98–111)
Creatinine, Ser: 3.67 mg/dL — ABNORMAL HIGH (ref 0.61–1.24)
GFR calc Af Amer: 21 mL/min — ABNORMAL LOW (ref >60)
GFR calc non Af Amer: 18 mL/min — ABNORMAL LOW (ref >60)
Glucose, Bld: 197 mg/dL — ABNORMAL HIGH (ref 70–99)
Potassium: 5.4 mmol/L — ABNORMAL HIGH (ref 3.5–5.1)
Sodium: 135 mmol/L (ref 135–145)
Total Bilirubin: 0.7 mg/dL (ref 0.3–1.2)
Total Protein: 7.8 g/dL (ref 6.5–8.1)

## 2019-03-14 LAB — PROCALCITONIN: Procalcitonin: 3.52 ng/mL

## 2019-03-14 LAB — POCT I-STAT 7, (LYTES, BLD GAS, ICA,H+H)
Acid-base deficit: 1 mmol/L (ref 0.0–2.0)
Bicarbonate: 22.5 mmol/L (ref 20.0–28.0)
Calcium, Ion: 1.37 mmol/L (ref 1.15–1.40)
HCT: 28 % — ABNORMAL LOW (ref 39.0–52.0)
Hemoglobin: 9.5 g/dL — ABNORMAL LOW (ref 13.0–17.0)
O2 Saturation: 98 %
Patient temperature: 100.2
Potassium: 4.9 mmol/L (ref 3.5–5.1)
Sodium: 134 mmol/L — ABNORMAL LOW (ref 135–145)
TCO2: 24 mmol/L (ref 22–32)
pCO2 arterial: 35 mmHg (ref 32.0–48.0)
pH, Arterial: 7.42 (ref 7.350–7.450)
pO2, Arterial: 109 mmHg — ABNORMAL HIGH (ref 83.0–108.0)

## 2019-03-14 LAB — RENAL FUNCTION PANEL
Albumin: 3.6 g/dL (ref 3.5–5.0)
Albumin: 3.5 g/dL (ref 3.5–5.0)
Anion gap: 17 — ABNORMAL HIGH (ref 5–15)
Anion gap: 14 (ref 5–15)
BUN: 61 mg/dL — ABNORMAL HIGH (ref 6–20)
BUN: 65 mg/dL — ABNORMAL HIGH (ref 6–20)
CO2: 22 mmol/L (ref 22–32)
CO2: 20 mmol/L — ABNORMAL LOW (ref 22–32)
Calcium: 10.5 mg/dL — ABNORMAL HIGH (ref 8.9–10.3)
Calcium: 9.6 mg/dL (ref 8.9–10.3)
Chloride: 97 mmol/L — ABNORMAL LOW (ref 98–111)
Chloride: 101 mmol/L (ref 98–111)
Creatinine, Ser: 3.51 mg/dL — ABNORMAL HIGH (ref 0.61–1.24)
Creatinine, Ser: 3.43 mg/dL — ABNORMAL HIGH (ref 0.61–1.24)
GFR calc Af Amer: 23 mL/min — ABNORMAL LOW (ref >60)
GFR calc Af Amer: 23 mL/min — ABNORMAL LOW (ref >60)
GFR calc non Af Amer: 19 mL/min — ABNORMAL LOW (ref >60)
GFR calc non Af Amer: 20 mL/min — ABNORMAL LOW (ref >60)
Glucose, Bld: 157 mg/dL — ABNORMAL HIGH (ref 70–99)
Glucose, Bld: 164 mg/dL — ABNORMAL HIGH (ref 70–99)
Phosphorus: 3.2 mg/dL (ref 2.5–4.6)
Phosphorus: 3.3 mg/dL (ref 2.5–4.6)
Potassium: 5 mmol/L (ref 3.5–5.1)
Potassium: 4.7 mmol/L (ref 3.5–5.1)
Sodium: 136 mmol/L (ref 135–145)
Sodium: 135 mmol/L (ref 135–145)

## 2019-03-14 LAB — PROTIME-INR
INR: 1.2 (ref 0.8–1.2)
Prothrombin Time: 15 seconds (ref 11.4–15.2)

## 2019-03-14 LAB — CBC
HCT: 29.7 % — ABNORMAL LOW (ref 39.0–52.0)
Hemoglobin: 9.1 g/dL — ABNORMAL LOW (ref 13.0–17.0)
MCH: 29.8 pg (ref 26.0–34.0)
MCHC: 30.6 g/dL (ref 30.0–36.0)
MCV: 97.4 fL (ref 80.0–100.0)
Platelets: 215 10*3/uL (ref 150–400)
RBC: 3.05 MIL/uL — ABNORMAL LOW (ref 4.22–5.81)
RDW: 17.6 % — ABNORMAL HIGH (ref 11.5–15.5)
WBC: 19 10*3/uL — ABNORMAL HIGH (ref 4.0–10.5)
nRBC: 0.4 % — ABNORMAL HIGH (ref 0.0–0.2)

## 2019-03-14 LAB — GLUCOSE, CAPILLARY
Glucose-Capillary: 132 mg/dL — ABNORMAL HIGH (ref 70–99)
Glucose-Capillary: 180 mg/dL — ABNORMAL HIGH (ref 70–99)
Glucose-Capillary: 186 mg/dL — ABNORMAL HIGH (ref 70–99)
Glucose-Capillary: 187 mg/dL — ABNORMAL HIGH (ref 70–99)
Glucose-Capillary: 269 mg/dL — ABNORMAL HIGH (ref 70–99)

## 2019-03-14 LAB — LACTIC ACID, PLASMA
Lactic Acid, Venous: 1.2 mmol/L (ref 0.5–1.9)
Lactic Acid, Venous: 1.2 mmol/L (ref 0.5–1.9)

## 2019-03-14 LAB — APTT: aPTT: 54 seconds — ABNORMAL HIGH (ref 24–36)

## 2019-03-14 LAB — MAGNESIUM: Magnesium: 3.4 mg/dL — ABNORMAL HIGH (ref 1.7–2.4)

## 2019-03-14 MED ORDER — PRISMASOL BGK 4/2.5 32-4-2.5 MEQ/L IV SOLN
INTRAVENOUS | Status: DC
  Administered 2019-03-14 – 2019-03-27 (×80): via INTRAVENOUS_CENTRAL
  Filled 2019-03-14 (×3): qty 5000

## 2019-03-14 MED ORDER — HEPARIN (PORCINE) 2000 UNITS/L FOR CRRT
INTRAVENOUS_CENTRAL | Status: DC | PRN
  Filled 2019-03-14 (×4): qty 1000

## 2019-03-14 MED ORDER — HEPARIN BOLUS VIA INFUSION (CRRT)
1000.0000 [IU] | INTRAVENOUS | Status: DC | PRN
  Administered 2019-03-19: 18:00:00 1000 [IU] via INTRAVENOUS_CENTRAL
  Filled 2019-03-14 (×5): qty 1000

## 2019-03-14 MED ORDER — VANCOMYCIN HCL 10 G IV SOLR
1250.0000 mg | Freq: Once | INTRAVENOUS | Status: AC
  Administered 2019-03-14: 11:00:00 1250 mg via INTRAVENOUS
  Filled 2019-03-14: qty 1250

## 2019-03-14 MED ORDER — OXYCODONE HCL 5 MG PO TABS
15.0000 mg | ORAL_TABLET | Freq: Four times a day (QID) | ORAL | Status: DC
  Administered 2019-03-14 – 2019-03-15 (×3): 15 mg via ORAL
  Filled 2019-03-14 (×3): qty 3

## 2019-03-14 MED ORDER — SODIUM CHLORIDE 0.9 % IV SOLN
2.0000 g | Freq: Two times a day (BID) | INTRAVENOUS | Status: DC
  Administered 2019-03-14 – 2019-03-16 (×5): 2 g via INTRAVENOUS
  Filled 2019-03-14 (×5): qty 2

## 2019-03-14 MED ORDER — INSULIN GLARGINE 100 UNIT/ML ~~LOC~~ SOLN
15.0000 [IU] | Freq: Two times a day (BID) | SUBCUTANEOUS | Status: DC
  Administered 2019-03-14 – 2019-03-26 (×22): 15 [IU] via SUBCUTANEOUS
  Filled 2019-03-14 (×24): qty 0.15

## 2019-03-14 MED ORDER — SODIUM CHLORIDE 0.9 % IV BOLUS
500.0000 mL | Freq: Once | INTRAVENOUS | Status: AC
  Administered 2019-03-14: 11:00:00 via INTRAVENOUS

## 2019-03-14 MED ORDER — PRISMASOL BGK 4/2.5 32-4-2.5 MEQ/L REPLACEMENT SOLN
Status: DC
  Administered 2019-03-14 – 2019-03-22 (×19): via INTRAVENOUS_CENTRAL
  Administered 2019-03-23: 14:00:00 500 mL via INTRAVENOUS_CENTRAL
  Administered 2019-03-23 – 2019-03-27 (×10): via INTRAVENOUS_CENTRAL
  Filled 2019-03-14: qty 5000

## 2019-03-14 MED ORDER — PRISMASOL B22GK 4/0 22-4 MEQ/L REPLACEMENT SOLN: Status: DC

## 2019-03-14 MED ORDER — METOPROLOL TARTRATE 5 MG/5ML IV SOLN
5.0000 mg | Freq: Four times a day (QID) | INTRAVENOUS | Status: DC | PRN
  Administered 2019-03-14: 11:00:00 5 mg via INTRAVENOUS
  Filled 2019-03-14: qty 5

## 2019-03-14 MED ORDER — HEPARIN SODIUM (PORCINE) 1000 UNIT/ML DIALYSIS
1000.0000 [IU] | INTRAMUSCULAR | Status: DC | PRN
  Filled 2019-03-14: qty 6

## 2019-03-14 MED ORDER — PRISMASOL BGK 4/2.5 32-4-2.5 MEQ/L REPLACEMENT SOLN
Status: DC
  Administered 2019-03-14 – 2019-03-22 (×12): via INTRAVENOUS_CENTRAL
  Administered 2019-03-23: 14:00:00 300 mL via INTRAVENOUS_CENTRAL
  Administered 2019-03-24 – 2019-03-26 (×6): via INTRAVENOUS_CENTRAL
  Filled 2019-03-14: qty 5000

## 2019-03-14 MED ORDER — SODIUM CHLORIDE 0.9 % IV SOLN
250.0000 [IU]/h | INTRAVENOUS | Status: DC
  Administered 2019-03-14 – 2019-03-25 (×23): 1000 [IU]/h via INTRAVENOUS_CENTRAL
  Administered 2019-03-26 (×2): 500 [IU]/h via INTRAVENOUS_CENTRAL
  Filled 2019-03-14 (×4): qty 10000
  Filled 2019-03-14 (×2): qty 2
  Filled 2019-03-14 (×3): qty 10000
  Filled 2019-03-14 (×3): qty 2
  Filled 2019-03-14: qty 10000
  Filled 2019-03-14 (×2): qty 2
  Filled 2019-03-14: qty 10000
  Filled 2019-03-14: qty 2
  Filled 2019-03-14: qty 10000
  Filled 2019-03-14 (×2): qty 2
  Filled 2019-03-14: qty 10000
  Filled 2019-03-14 (×3): qty 2
  Filled 2019-03-14: qty 10000
  Filled 2019-03-14: qty 2
  Filled 2019-03-14: qty 10000
  Filled 2019-03-14 (×2): qty 2
  Filled 2019-03-14: qty 10000

## 2019-03-14 MED ORDER — FREE WATER
200.0000 mL | Freq: Three times a day (TID) | Status: DC
  Administered 2019-03-14 – 2019-03-15 (×3): 200 mL

## 2019-03-14 MED ORDER — HEPARIN SODIUM (PORCINE) 5000 UNIT/ML IJ SOLN
7500.0000 [IU] | Freq: Three times a day (TID) | INTRAMUSCULAR | Status: DC
  Administered 2019-03-14 – 2019-03-18 (×10): 7500 [IU] via SUBCUTANEOUS
  Filled 2019-03-14 (×10): qty 2

## 2019-03-14 MED ORDER — HEPARIN SODIUM (PORCINE) 1000 UNIT/ML DIALYSIS
1000.0000 [IU] | INTRAMUSCULAR | Status: DC | PRN
  Administered 2019-03-16: 10:00:00 1000 [IU] via INTRAVENOUS_CENTRAL
  Administered 2019-03-17: 08:00:00 2800 [IU] via INTRAVENOUS_CENTRAL
  Administered 2019-03-24: 3000 [IU] via INTRAVENOUS_CENTRAL
  Administered 2019-03-27: 10:00:00 3400 [IU] via INTRAVENOUS_CENTRAL
  Filled 2019-03-14 (×6): qty 6

## 2019-03-14 MED ORDER — VANCOMYCIN HCL IN DEXTROSE 750-5 MG/150ML-% IV SOLN
750.0000 mg | INTRAVENOUS | Status: DC
  Administered 2019-03-15 – 2019-03-18 (×4): 750 mg via INTRAVENOUS
  Filled 2019-03-14 (×4): qty 150

## 2019-03-14 MED ORDER — NOREPINEPHRINE 4 MG/250ML-% IV SOLN
INTRAVENOUS | Status: AC
  Administered 2019-03-14: 11:00:00 1.333 ug/min via INTRAVENOUS
  Filled 2019-03-14: qty 250

## 2019-03-14 NOTE — Progress Notes (Signed)
NAME:  Jonathan [name], MRN:  [id number], DOB:  06/06/1971, LOS: 53 ADMISSION DATE:  02/23/2019, CONSULTATION DATE: February 23, 2019 REFERRING MD: Morton Amy, CHIEF COMPLAINT: Dyspnea  Brief History   48 year old male admitted on February 23, 2019 with ARDS due to COVID-19.  Has had severe ARDS required prone ventilation for many days, developed acute kidney injury requiring continuous renal replacement therapy.  Past Medical History  Diabetes mellitus  Significant Hospital Events   4/21 admitted and immediately proned on inverse ratio PC ventilation with high PEEP to maintain saturation >85%, Started on NO at 40ppm. 4/22- Tocluzimab X 1 4/22- started CVVHD 4/21-4/24 Proned daily 4/28 -4/30 proned daily , tapered NO to off  4/30 -late evening with increased O2 demands . No paralytics required x 24hr . No vent dysyncrony . Calm on Fent /Versed. Off precedex   5/1 - Remains on CRRT , no acute issues overnight --4.8 L I/O Balance since admit . Continues to Prone on/off 16 on and 8 off . -CXR stable diffuse bilateral infiltrates (slightly improved aeration ) . , Vent .80Fio2 .  5/3 convalescent plasma  Biomarkes improving but severe ARDS persists ad D-dimer still very high (13) without improvement. On IV Heparin gtt. Growing MSSA. SEdation changed to dilaudid gtt. Continues prone 16h and 8h syupine. Currently supine at 80% fio2, peep 18. On TF. STARTED NIMBEX  5/4-5/5 -able to stay in the supine position, able to discontinue paralysis and maintain FiO2 0.50, PEEP 8-10.   5/5 hematochezia, bloody gastric aspirate  5/8 followed commands  Consults:  PCCM 4/21  Procedures:  4/21 - ETT 7.5 >> 4/21 - RIJ CVC [facility] >> 4/22 L IJ HD Cath>   Significant Diagnostic Tests:  POC U/S 4/21: Bilateral interstitial pattern with bilateral dense consolidation at bases. Echo shows low-normal LVSF. RV moderately dilated with septal shift.   Micro Data:  Blood cultures -PND SARS-CoV2  02/23/2019 - - POSITIVE HIV 4/21 -neg Quant gold 4/2 1- neg .....................Marland Kitchen resp 4/29 >>rare MSSA and yeast   Anti-covid RX and antimicrobial RX  Azithromycin 4/21 >>4/25 Ceftriaxone 4/22 >>4/25 Actemra 4/22 Hydroxychloroquine 4/21 > 4/22 ........................... Ceftriaxone 5/2 >> 5/10   Interim history/subjective:   Agitation overnight  Objective   Blood pressure 122/85, pulse (!) 141, temperature 98.1 F (36.7 C), temperature source Axillary, resp. rate (!) 35, height 5\' 5"  (1.651 m), weight 64.7 kg, SpO2 (!) 35 %. CVP:  [1 mmHg-5 mmHg] 2 mmHg  Vent Mode: PRVC FiO2 (%):  [40 %] 40 % Set Rate:  [30 bmp] 30 bmp Vt Set:  [490 mL] 490 mL PEEP:  [5 cmH20] 5 cmH20 Plateau Pressure:  [17 cmH20-26 cmH20] 23 cmH20   Intake/Output Summary (Last 24 hours) at 03/14/2019 0741 Last data filed at 03/14/2019 0700 Gross per 24 hour  Intake 1857.42 ml  Output 2299 ml  Net -441.58 ml   Filed Weights   03/12/19 0500 03/13/19 0500 03/14/19 0354  Weight: 70.7 kg 65.2 kg 64.7 kg    Examination:  General:  Sedated, in bed, acutely ill appearing HENT: [place]/AT, PERRL, EOM-I and MMM PULM: CTA bilaterally CV: RRR, Nl S1/S2 and -M/R/G GI: Soft, NT, ND and +BS MSK: normal bulk and tone Neuro: Sedated on vent, withdraws ext to pain  I reviewed CXR myself, infiltrate noted  Resolved Hospital Problem list     Assessment & Plan:  ARDS due to COVID-19: improved oxygenation PS as tolerated If no improvement by Tuesday will need trach Increase oxycodone with the  hope to d/c dilaudid drip VAP prevention Maintain PEEP/FiO2 to keep SaO2 > 88% Hold volume removal  Acute kidney injury: remains oliguric Continue CVVHD per nephrology May need a perm cath placement once acute illness is over BMET in AM Replace electrolytes as indicated  MSSA pneumonia: D/C rocephin, completed course  Need for sedation for ventilator synchrony: At this point can decrease sedation Continue  seroquel Increase oxycodone to 15 mg PO q6 in an attempt to get dilaudid off Low dose dilaudid infusion   Best practice:  Diet: tube feeding Pain/Anxiety/Delirium protocol (if indicated): yes, as above order PAD protocol dilaudid infusion, RASS goal 0 to -1 VAP protocol (if indicated): yes DVT prophylaxis: sub cutaneous heparin GI prophylaxis: PPI, H2 blocker Glucose control: monitor  Mobility: bed rest, start passive range of motion Code Status: partial code, no CPR Family Communication: updated family by phone on 5/8 Disposition: remain in ICU  Labs   CBC: Recent Labs  Lab 03/08/19 2334 03/09/19 0356  03/10/19 0400 03/10/19 1119 03/11/19 0243 03/11/19 0535 03/12/19 0223 03/12/19 1649  WBC 15.2* 16.6*  --  13.0*  --  14.6*  --  14.5*  --   HGB 9.5* 9.2*   < > 8.0* 8.2* 8.2* 8.2* 8.7* 10.2*  HCT 31.2* 29.9*   < > 25.9* 24.0* 27.2* 24.0* 28.4* 30.0*  MCV 96.3 96.8  --  98.1  --  98.2  --  98.6  --   PLT 150 158  --  134*  --  167  --  158  --    < > = values in this interval not displayed.    Basic Metabolic Panel: Recent Labs  Lab 03/09/19 0356  03/10/19 0400  03/10/19 1750 03/11/19 0243 03/11/19 0535 03/12/19 0223 03/12/19 1649 03/13/19 0200  NA 134*   < > 135   < > 135 135 134* 133* 135 134*  K 4.2   < > 4.4   < > 4.1 4.0 4.1 4.3 4.6 4.8  CL 100   < > 99  --  101 101  --  99 100 100  CO2 22   < > 23  --  24 24  --  25  --  22  GLUCOSE 108*   < > 183*  --  137* 129*  --  162* 162* 187*  BUN 41*   < > 43*  --  53* 51*  --  49* 30* 42*  CREATININE 2.06*   < > 2.56*  --  2.99* 2.69*  --  2.29* 1.60* 2.12*  CALCIUM 8.3*   < > 8.9  --  9.0 9.0  --  9.1  --  9.1  MG 2.6*  --  2.6*  --   --  2.8*  --  2.8*  --  3.0*  PHOS 2.9   < > 2.1*  --  4.2 3.6  3.6  --  3.6  --  2.8   < > = values in this interval not displayed.   GFR: Estimated Creatinine Clearance: 37.1 mL/min (A) (by C-G formula based on SCr of 2.12 mg/dL (H)). Recent Labs  Lab 03/08/19 0000   03/09/19 0356 03/10/19 0400 03/11/19 0243 03/12/19 0223  WBC  --    < > 16.6* 13.0* 14.6* 14.5*  LATICACIDVEN 1.2  --   --   --   --   --    < > = values in this interval not displayed.    Liver Function Tests: Recent  Labs  Lab 03/08/19 0313  03/10/19 0400 03/10/19 1750 03/11/19 0243 03/12/19 0223 03/13/19 0200  AST 26  --   --   --   --   --   --   ALT 29  --   --   --   --   --   --   ALKPHOS 112  --   --   --   --   --   --   BILITOT 0.6  --   --   --   --   --   --   PROT 6.5  --   --   --   --   --   --   ALBUMIN 2.7*  2.6*   < > 2.9* 3.3* 3.3* 3.6 3.3*   < > = values in this interval not displayed.   No results for input(s): LIPASE, AMYLASE in the last 168 hours. No results for input(s): AMMONIA in the last 168 hours.  ABG    Component Value Date/Time   PHART 7.334 (L) 03/11/2019 0535   PCO2ART 47.7 03/11/2019 0535   PO2ART 67.0 (L) 03/11/2019 0535   HCO3 25.2 03/11/2019 0535   TCO2 28 03/12/2019 1649   ACIDBASEDEF 1.0 03/11/2019 0535   O2SAT 91.0 03/11/2019 0535     Coagulation Profile: Recent Labs  Lab 03/08/19 0313  INR 1.1    Cardiac Enzymes: Recent Labs  Lab 03/08/19 0313  TROPONINI <0.03    HbA1C: No results found for: HGBA1C  CBG: Recent Labs  Lab 03/13/19 0845 03/13/19 1619 03/13/19 1956 03/13/19 2329 03/14/19 0333  GLUCAP 172* 159* 205* 196* 269*    The patient is critically ill with multiple organ systems failure and requires high complexity decision making for assessment and support, frequent evaluation and titration of therapies, application of advanced monitoring technologies and extensive interpretation of multiple databases.   Critical Care Time devoted to patient care services described in this note is  35  Minutes. This time reflects time of care of this signee Dr [name]. This critical care time does not reflect procedure time, or teaching time or supervisory time of PA/NP/Med student/Med Resident etc but could  involve care discussion time.  Rush Farmer, M.D. [facility] Pulmonary/Critical Care Medicine. Pager: [phone]. After hours pager: [phone].

## 2019-03-14 NOTE — Progress Notes (Signed)
Pharmacy Antibiotic Note  Jonathan [name] is a 48 y.o. male admitted on 02/23/2019 with sepsis.  Pharmacy has been consulted for vancomycin and cefepime dosing. S/p abx already. Spiked a fever on CRRT.  Plan: Give vancomycin 1,250mg  IV x 1, then start vancomycin 750mg  IV Q24h Start cefepime 2g IV Q12h Monitor clinical picture, renal function, vanc levels prn F/U C&S, abx deescalation / LOT  Height: 5\' 5"  (165.1 cm) Weight: 142 lb 10.2 oz (64.7 kg) IBW/kg (Calculated) : 61.5  Temp (24hrs), Avg:99.1 F (37.3 C), Min:97.7 F (36.5 C), Max:100.6 F (38.1 C)  Recent Labs  Lab 03/08/19 0000  03/08/19 2334 03/09/19 0356  03/10/19 0400  03/11/19 0243 03/12/19 0223 03/12/19 1649 03/13/19 0200 03/14/19 0941  WBC  --    < > 15.2* 16.6*  --  13.0*  --  14.6* 14.5*  --   --   --   CREATININE  --    < >  --  2.06*   < > 2.56*   < > 2.69* 2.29* 1.60* 2.12* 3.51*  LATICACIDVEN 1.2  --   --   --   --   --   --   --   --   --   --   --    < > = values in this interval not displayed.    Estimated Creatinine Clearance: 22.4 mL/min (A) (by C-G formula based on SCr of 3.51 mg/dL (H)).    Not on File   Thank you for allowing pharmacy to be a part of this patient's care.  Reginia Naas 03/14/2019 11:08 AM

## 2019-03-14 NOTE — Progress Notes (Signed)
Patient bladder scanned for 90 cc at 1600 today. Still no urine output. Jonathan [name]

## 2019-03-14 NOTE — Progress Notes (Signed)
Spoke with Dr. Grayland Ormond concerning PICC placement. Dr. Augustin Coupe had seen the patient earlier today. Patient has a temporary HD cath, a TL CVC, and a arterial line. Patient is febrile and blood cultures drawn today. Per policy, Nephrology to authorize PICC. Dr. Augustin Coupe contacted by Dr. Grayland Ormond and stated that it is alright for PICC to be placed.

## 2019-03-14 NOTE — Progress Notes (Signed)
[facility] Progress Note    Assessment/ Plan:   Pt is a48 y.o.yo maleCOVID, cardiogenic shock, hypoxia, VDRF, AKI on CRRT.  1)Anuric AKI due to ATN, cardiogenic shock: On CRRT since 4/22, continue. Discussed withnurse, toleratingUF goal~100 cc an hournet UF. Currently on 4K bathand fortunately potassium is in the acceptable range. Heparin for anticoagulation, no issue with the filter. Has LIJ catheter.   -US renalwith no hydronephrosis. -No sign of renal recovery(bladder scan only 174m).  ->keep even, more tachy, no edema  -> will restart CRRT today; I d/w VVS (Dr. BTrula Slade and plan is to convert to tunneled HD cath mon. With K drifting up and minimal UOP would like to facilitate the conversion tomorrow.   I&O/48 hr net -> - 2278/442 mL  Net I&O during hospitalization is neg 12.5 L  2)COVID-19, ARDS, hypoxic respiratory failure on mechanical ventilation: Received plasma. Per PCCM.  3)Anemia in critical illness: Transfusion as needed.  4)Encephalopathy  Subjective:    Not on pressors. Responding to some commands by review of chart   Objective:   BP (!) 89/52   Pulse (!) 115   Temp (!) 100.6 F (38.1 C) (Oral)   Resp (!) 24   Ht _0  (1.651 m)   Wt 64.7 kg   SpO2 98%   BMI 23.74 kg/m   Intake/Output Summary (Last 24 hours) at 03/14/2019 1212 Last data filed at 03/14/2019 0700 Gross per 24 hour  Intake 1757.42 ml  Output 1693 ml  Net 64.42 ml   Weight change: -0.5 kg  Physical Exam: P5 and FIO2 of 40% No edema in the lower ext LIJ temp cath for dialysis  Imaging: Dg Chest Port 1 View  Result Date: 03/14/2019 CLINICAL DATA:  Respiratory distress EXAM: PORTABLE CHEST 1 VIEW COMPARISON:  Radiograph 03/11/2011 FINDINGS: Endotracheal tube has been withdrawn to the thoracic inlet level and is now 7.5 cm from carina. (Previously 2.5 cm). Two central venous lines are unchanged. NG tube extends into the stomach.  Normal cardiac silhouette. Peripheral bilateral airspace disease is slightly improved from comparison exam. IMPRESSION: 1. Endotracheal tube has been withdrawn to the thoracic inlet as described above. 2. Peripheral airspace disease slightly improved from prior. Electronically Signed   By: SSuzy BouchardM.D.   On: 03/14/2019 11:49   [place] Site Rite  Result Date: 03/14/2019 If Site Rite image not attached, placement could not be confirmed due to current cardiac rhythm.   Labs: BMET Recent Labs  Lab 03/09/19 1600 03/10/19 0400  03/10/19 1750 03/11/19 0243 03/11/19 0535 03/12/19 0223 03/12/19 1649 03/13/19 0200 03/14/19 0941 03/14/19 0956  NA 134* 135   < > 135 135 134* 133* 135 134* 136 134*  K 4.3 4.4   < > 4.1 4.0 4.1 4.3 4.6 4.8 5.0 4.9  CL 100 99  --  101 101  --  99 100 100 97*  --   CO2 23 23  --  24 24  --  25  --  22 22  --   GLUCOSE 137* 183*  --  137* 129*  --  162* 162* 187* 157*  --   BUN 43* 43*  --  53* 51*  --  49* 30* 42* 61*  --   CREATININE 2.15* 2.56*  --  2.99* 2.69*  --  2.29* 1.60* 2.12* 3.51*  --   CALCIUM 8.6* 8.9  --  9.0 9.0  --  9.1  --  9.1 10.5*  --   PHOS  2.5 2.1*  --  4.2 3.6  3.6  --  3.6  --  2.8 3.2  --    < > = values in this interval not displayed.   CBC Recent Labs  Lab 03/10/19 0400  03/11/19 0243  03/12/19 0223 03/12/19 1649 03/14/19 0956 03/14/19 1100  WBC 13.0*  --  14.6*  --  14.5*  --   --  19.0*  HGB 8.0*   < > 8.2*   < > 8.7* 10.2* 9.5* 9.1*  HCT 25.9*   < > 27.2*   < > 28.4* 30.0* 28.0* 29.7*  MCV 98.1  --  98.2  --  98.6  --   --  97.4  PLT 134*  --  167  --  158  --   --  215   < > = values in this interval not displayed.    Medications:    . sodium chloride   Intravenous Once  . chlorhexidine gluconate (MEDLINE KIT)  15 mL Mouth Rinse BID  . Chlorhexidine Gluconate Cloth  6 each Topical Daily  . docusate  100 mg Per Tube BID  . famotidine  20 mg Per Tube Daily  . feeding supplement (PRO-STAT SUGAR FREE 64)  60  mL Per Tube TID  . feeding supplement (VITAL AF 1.2 CAL)  1,000 mL Per Tube Q24H  . free water  200 mL Per Tube Q8H  . heparin  7,500 Units Subcutaneous Q8H  . hydrALAZINE  25 mg Oral Q8H  . insulin aspart  0-15 Units Subcutaneous Q4H  . insulin aspart  6 Units Subcutaneous Q4H  . insulin glargine  15 Units Subcutaneous BID  . mouth rinse  15 mL Mouth Rinse 10 times per day  . oxyCODONE  15 mg Oral Q6H  . QUEtiapine  50 mg Oral BID  . sennosides  5 mL Oral BID  . sodium chloride flush  10-40 mL Intracatheter Q12H      Otelia Santee, MD 03/14/2019, 12:12 PM

## 2019-03-14 NOTE — Progress Notes (Signed)
Spoke with Dr Aileen Fass re PICC insertion- Permanent HD cath to be placed on the Right side, prefer PICC placement on the left.  Currently for line holiday and requests PICC to be placed 5/11. Blood cultures pending with fevers noted.

## 2019-03-14 NOTE — Progress Notes (Signed)
[facility] PROGRESS NOTE    Progress Note  Stephanos Fan  SRP:[id number] DOB: June 11, 1971 DOA: 02/23/2019 PCP: Patient, No Pcp Per     Brief Narrative:   Devario Bucklew is an 48 y.o. male noninjected speaker arrived from [place] on 02/13/2019 lesion close quarters with 6 of the workers came in in respiratory distress intubated for ARDS due to COVID-19 infection started on CRRT, paralyzed and prone.  Medications: 4/22- Tocluzimab X 1 03/12/2019 on IV heparin drip. 03/07/2019, convalescent plasma 03/09/2019 paralytic stopped  Events: 02/23/2019 admitted to the ICU. 02/24/2019 started on CVVHD 4/21-4/24 Proned daily 4/28 -4/30 proned daily  03/04/2019 increase in oxygen demand require paralytic for 24 hours. 03/09/2019 hematochezia from gastric aspirate  Lines and tubes: 4/21 - ETT 7.5 >> 4/21 - RIJ CVC [facility] >> 4/22 L IJ HD Cath> 03/15/2019 HD catheter  Antibiotics: 02/23/2019-425 2020 azithromycin and Rocephin 02/23/2019-02/24/2019 03/06/2019 Rocephin   Assessment/Plan:   Acute respiratory failure with hypoxia/COVID-19/  ARDS (adult respiratory distress syndrome) ([place]) Continue vent management per  PCCM on ARDS HD protocol Paralytic stop on 03/09/2019.  Biomarkers are improved Intubated on 02/23/2019 he is close to tracheostomy. He satting greater than 90% on pressure support and FiO2 40%  ATN: Continue CVVHD per renal, CVVHD stopped on 5.10.2020, 170 cc on bladder scan For HD catheter on 03/15/2019.  Anemia due to chronic illness and possibly blood loss bleed: Hemoglobin today 10.2, with a stable hemoglobin. Continue IV famotidine. Check a CBC.  Elevated d-dimer: Likely related to inflammatory state in the setting of hypercoagulable state.  Dimer is trending down. He was initially on IV heparin which was stopped after GI bleed. Started on IV famotidine, now on full dose heparin.  Sinus tachycardia: Start IV metoprolol.  Pressure injury of skin on the  nose:  Obese:    DVT prophylaxis: Heparin Family Communication:none Disposition Plan/Barrier to D/C: unable to determine Code Status:     Code Status Orders  (From admission, onward)         Start     Ordered   03/02/19 1658  Limited resuscitation (code)  Continuous    Question Answer Comment  In the event of cardiac or respiratory ARREST: Initiate Code Blue, Call Rapid Response No   In the event of cardiac or respiratory ARREST: Perform CPR No   In the event of cardiac or respiratory ARREST: Perform Intubation/Mechanical Ventilation Yes   In the event of cardiac or respiratory ARREST: Use NIPPV/BiPAp only if indicated Yes   In the event of cardiac or respiratory ARREST: Administer ACLS medications if indicated Yes   In the event of cardiac or respiratory ARREST: Perform Defibrillation or Cardioversion if indicated Yes      03/02/19 1657        Code Status History    Date Active Date Inactive Code Status Order ID Comments User Context   02/23/2019 1051 03/02/2019 1657 Full Code [id number]  Kipp Brood, MD ED        IV Access:    Peripheral IV   Procedures and diagnostic studies:   No results found.   Medical Consultants:    None.  Anti-Infectives:   Azithromycin 4/21 >>4/25 Ceftriaxone 4/22 >>4/25 Hydroxychloroquine 4/21 > 4/22  Subjective:    [place] intubated, mildly sedated able to open his eyes on command  Objective:    Vitals:   03/14/19 0354 03/14/19 0400 03/14/19 0500 03/14/19 0600  BP:   100/75 (!) 134/92  Pulse:  Marland Kitchen)  131 (!) 116 (!) 136  Resp:  (!) 24 (!) 30 (!) 30  Temp:  98.1 F (36.7 C)    TempSrc:  Axillary    SpO2:  98% 99% 98%  Weight: 64.7 kg     Height:        Intake/Output Summary (Last 24 hours) at 03/14/2019 0719 Last data filed at 03/14/2019 0700 Gross per 24 hour  Intake 1857.42 ml  Output 2299 ml  Net -441.58 ml   Filed Weights   03/12/19 0500 03/13/19 0500 03/14/19 0354  Weight: 70.7 kg 65.2  kg 64.7 kg    Exam: General exam: Intubated in supine position. Respiratory system: Good air movement with crackles at bases Cardiovascular system: Rate and rhythm with positive S1-S2 no murmurs rubs gallops. Gastrointestinal system: Bowel sounds soft nontender nondistended. Extremities: Trace edema. Skin: No rashes, pressure ulcer on his nasolabial fold   Data Reviewed:    Labs: Basic Metabolic Panel: Recent Labs  Lab 03/09/19 0356  03/10/19 0400  03/10/19 1750 03/11/19 0243 03/11/19 0535 03/12/19 0223 03/12/19 1649 03/13/19 0200  NA 134*   < > 135   < > 135 135 134* 133* 135 134*  K 4.2   < > 4.4   < > 4.1 4.0 4.1 4.3 4.6 4.8  CL 100   < > 99  --  101 101  --  99 100 100  CO2 22   < > 23  --  24 24  --  25  --  22  GLUCOSE 108*   < > 183*  --  137* 129*  --  162* 162* 187*  BUN 41*   < > 43*  --  53* 51*  --  49* 30* 42*  CREATININE 2.06*   < > 2.56*  --  2.99* 2.69*  --  2.29* 1.60* 2.12*  CALCIUM 8.3*   < > 8.9  --  9.0 9.0  --  9.1  --  9.1  MG 2.6*  --  2.6*  --   --  2.8*  --  2.8*  --  3.0*  PHOS 2.9   < > 2.1*  --  4.2 3.6  3.6  --  3.6  --  2.8   < > = values in this interval not displayed.   GFR Estimated Creatinine Clearance: 37.1 mL/min (A) (by C-G formula based on SCr of 2.12 mg/dL (H)). Liver Function Tests: Recent Labs  Lab 03/08/19 0313  03/10/19 0400 03/10/19 1750 03/11/19 0243 03/12/19 0223 03/13/19 0200  AST 26  --   --   --   --   --   --   ALT 29  --   --   --   --   --   --   ALKPHOS 112  --   --   --   --   --   --   BILITOT 0.6  --   --   --   --   --   --   PROT 6.5  --   --   --   --   --   --   ALBUMIN 2.7*  2.6*   < > 2.9* 3.3* 3.3* 3.6 3.3*   < > = values in this interval not displayed.   No results for input(s): LIPASE, AMYLASE in the last 168 hours. No results for input(s): AMMONIA in the last 168 hours. Coagulation profile Recent Labs  Lab 03/08/19 0313  INR 1.1  COVID-19 Labs  Recent Labs    03/12/19 0915  03/13/19 1322  DDIMER  --  5.51*  LDH 386*  --     Lab Results  Component Value Date   SARSCOV2NAA POSITIVE (A) 02/23/2019    CBC: Recent Labs  Lab 03/08/19 2334 03/09/19 0356  03/10/19 0400 03/10/19 1119 03/11/19 0243 03/11/19 0535 03/12/19 0223 03/12/19 1649  WBC 15.2* 16.6*  --  13.0*  --  14.6*  --  14.5*  --   HGB 9.5* 9.2*   < > 8.0* 8.2* 8.2* 8.2* 8.7* 10.2*  HCT 31.2* 29.9*   < > 25.9* 24.0* 27.2* 24.0* 28.4* 30.0*  MCV 96.3 96.8  --  98.1  --  98.2  --  98.6  --   PLT 150 158  --  134*  --  167  --  158  --    < > = values in this interval not displayed.   Cardiac Enzymes: Recent Labs  Lab 03/08/19 0313  TROPONINI <0.03   BNP (last 3 results) No results for input(s): PROBNP in the last 8760 hours. CBG: Recent Labs  Lab 03/13/19 0845 03/13/19 1619 03/13/19 1956 03/13/19 2329 03/14/19 0333  GLUCAP 172* 159* 205* 196* 269*   D-Dimer: Recent Labs    03/13/19 1322  DDIMER 5.51*   Hgb A1c: No results for input(s): HGBA1C in the last 72 hours. Lipid Profile: No results for input(s): CHOL, HDL, LDLCALC, TRIG, CHOLHDL, LDLDIRECT in the last 72 hours. Thyroid function studies: No results for input(s): TSH, T4TOTAL, T3FREE, THYROIDAB in the last 72 hours.  Invalid input(s): FREET3 Anemia work up: No results for input(s): VITAMINB12, FOLATE, FERRITIN, TIBC, IRON, RETICCTPCT in the last 72 hours. Sepsis Labs: Recent Labs  Lab 03/08/19 0000  03/09/19 0356 03/10/19 0400 03/11/19 0243 03/12/19 0223  WBC  --    < > 16.6* 13.0* 14.6* 14.5*  LATICACIDVEN 1.2  --   --   --   --   --    < > = values in this interval not displayed.   Microbiology No results found for this or any previous visit (from the past 240 hour(s)).   Medications:   . sodium chloride   Intravenous Once  . chlorhexidine gluconate (MEDLINE KIT)  15 mL Mouth Rinse BID  . Chlorhexidine Gluconate Cloth  6 each Topical Daily  . docusate  100 mg Per Tube BID  . famotidine  20 mg  Per Tube Daily  . feeding supplement (PRO-STAT SUGAR FREE 64)  60 mL Per Tube TID  . feeding supplement (VITAL AF 1.2 CAL)  1,000 mL Per Tube Q24H  . heparin  7,500 Units Subcutaneous Q8H  . hydrALAZINE  25 mg Oral Q8H  . insulin aspart  0-15 Units Subcutaneous Q4H  . insulin aspart  6 Units Subcutaneous Q4H  . insulin glargine  10 Units Subcutaneous BID  . mouth rinse  15 mL Mouth Rinse 10 times per day  . oxyCODONE  10 mg Oral Q6H  . QUEtiapine  50 mg Oral BID  . sennosides  5 mL Oral BID  . sodium chloride flush  10-40 mL Intracatheter Q12H   Continuous Infusions: .  prismasol BGK 4/2.5 500 mL/hr at 03/14/19 0134  .  prismasol BGK 4/2.5 300 mL/hr at 03/13/19 2159  . sodium chloride    . sodium chloride 10 mL/hr at 03/14/19 0700  . sodium chloride 10 mL/hr at 03/14/19 0700  . heparin 10,000 units/ 20 mL infusion syringe 500  Units/hr (03/13/19 2215)  . HYDROmorphone 2 mg/hr (03/14/19 0700)  . norepinephrine (LEVOPHED) Adult infusion Stopped (03/10/19 0825)  . prismasol BGK 4/2.5 1,500 mL/hr at 03/14/19 0250  . sodium chloride 0.9 % 1,000 mL with heparin 2,000 Units infusion 999 mL/hr at 03/13/19 0800     LOS: 19 days   [facility]  03/14/2019, 7:19 AM

## 2019-03-14 NOTE — Progress Notes (Signed)
Vent mode changed by CCM.  Patient tolerating well at this time.

## 2019-03-14 NOTE — Progress Notes (Signed)
Spoke with Levada Dy RN re PICC order for tomorrow.  States will attempt to obtain consent from wife in [place].  D/w Angela IV access options and need for potential line holiday.

## 2019-03-15 LAB — GLUCOSE, CAPILLARY
Glucose-Capillary: 129 mg/dL — ABNORMAL HIGH (ref 70–99)
Glucose-Capillary: 137 mg/dL — ABNORMAL HIGH (ref 70–99)
Glucose-Capillary: 148 mg/dL — ABNORMAL HIGH (ref 70–99)
Glucose-Capillary: 171 mg/dL — ABNORMAL HIGH (ref 70–99)
Glucose-Capillary: 178 mg/dL — ABNORMAL HIGH (ref 70–99)
Glucose-Capillary: 67 mg/dL — ABNORMAL LOW (ref 70–99)
Glucose-Capillary: 70 mg/dL (ref 70–99)
Glucose-Capillary: 97 mg/dL (ref 70–99)

## 2019-03-15 LAB — POCT I-STAT 7, (LYTES, BLD GAS, ICA,H+H)
Bicarbonate: 25.1 mmol/L (ref 20.0–28.0)
Calcium, Ion: 1.31 mmol/L (ref 1.15–1.40)
HCT: 26 % — ABNORMAL LOW (ref 39.0–52.0)
Hemoglobin: 8.8 g/dL — ABNORMAL LOW (ref 13.0–17.0)
O2 Saturation: 97 %
Patient temperature: 98.6
Potassium: 4.2 mmol/L (ref 3.5–5.1)
Sodium: 135 mmol/L (ref 135–145)
TCO2: 26 mmol/L (ref 22–32)
pCO2 arterial: 44.3 mmHg (ref 32.0–48.0)
pH, Arterial: 7.362 (ref 7.350–7.450)
pO2, Arterial: 98 mmHg (ref 83.0–108.0)

## 2019-03-15 LAB — RENAL FUNCTION PANEL
Albumin: 3.4 g/dL — ABNORMAL LOW (ref 3.5–5.0)
Albumin: 3.5 g/dL (ref 3.5–5.0)
Anion gap: 11 (ref 5–15)
Anion gap: 13 (ref 5–15)
BUN: 55 mg/dL — ABNORMAL HIGH (ref 6–20)
BUN: 41 mg/dL — ABNORMAL HIGH (ref 6–20)
CO2: 24 mmol/L (ref 22–32)
CO2: 23 mmol/L (ref 22–32)
Calcium: 9.4 mg/dL (ref 8.9–10.3)
Calcium: 9.8 mg/dL (ref 8.9–10.3)
Chloride: 99 mmol/L (ref 98–111)
Chloride: 99 mmol/L (ref 98–111)
Creatinine, Ser: 2.54 mg/dL — ABNORMAL HIGH (ref 0.61–1.24)
Creatinine, Ser: 2.36 mg/dL — ABNORMAL HIGH (ref 0.61–1.24)
GFR calc Af Amer: 33 mL/min — ABNORMAL LOW (ref >60)
GFR calc Af Amer: 36 mL/min — ABNORMAL LOW (ref >60)
GFR calc non Af Amer: 29 mL/min — ABNORMAL LOW (ref >60)
GFR calc non Af Amer: 31 mL/min — ABNORMAL LOW (ref >60)
Glucose, Bld: 135 mg/dL — ABNORMAL HIGH (ref 70–99)
Glucose, Bld: 59 mg/dL — ABNORMAL LOW (ref 70–99)
Phosphorus: 3.6 mg/dL (ref 2.5–4.6)
Phosphorus: 3.1 mg/dL (ref 2.5–4.6)
Potassium: 4.7 mmol/L (ref 3.5–5.1)
Potassium: 5 mmol/L (ref 3.5–5.1)
Sodium: 134 mmol/L — ABNORMAL LOW (ref 135–145)
Sodium: 135 mmol/L (ref 135–145)

## 2019-03-15 LAB — MAGNESIUM: Magnesium: 3.1 mg/dL — ABNORMAL HIGH (ref 1.7–2.4)

## 2019-03-15 LAB — CBC
HCT: 26.4 % — ABNORMAL LOW (ref 39.0–52.0)
Hemoglobin: 8.2 g/dL — ABNORMAL LOW (ref 13.0–17.0)
MCH: 30.6 pg (ref 26.0–34.0)
MCHC: 31.1 g/dL (ref 30.0–36.0)
MCV: 98.5 fL (ref 80.0–100.0)
Platelets: 193 10*3/uL (ref 150–400)
RBC: 2.68 MIL/uL — ABNORMAL LOW (ref 4.22–5.81)
RDW: 17.9 % — ABNORMAL HIGH (ref 11.5–15.5)
WBC: 17.2 10*3/uL — ABNORMAL HIGH (ref 4.0–10.5)
nRBC: 0.2 % (ref 0.0–0.2)

## 2019-03-15 LAB — APTT: aPTT: 146 seconds — ABNORMAL HIGH (ref 24–36)

## 2019-03-15 MED ORDER — GLYCOPYRROLATE 0.2 MG/ML IJ SOLN
0.1000 mg | INTRAMUSCULAR | Status: DC | PRN
  Administered 2019-03-15 – 2019-03-16 (×2): 0.1 mg via INTRAVENOUS
  Filled 2019-03-15 (×2): qty 1

## 2019-03-15 MED ORDER — DEXTROSE 50 % IV SOLN
1.0000 | Freq: Once | INTRAVENOUS | Status: AC
  Administered 2019-03-15: 19:00:00 25 mL via INTRAVENOUS

## 2019-03-15 MED ORDER — HALOPERIDOL LACTATE 5 MG/ML IJ SOLN
1.0000 mg | Freq: Four times a day (QID) | INTRAMUSCULAR | Status: DC | PRN
  Administered 2019-03-15: 18:00:00 3 mg via INTRAVENOUS
  Administered 2019-03-16: 19:00:00 4 mg via INTRAVENOUS
  Administered 2019-03-16: 09:00:00 3 mg via INTRAVENOUS
  Filled 2019-03-15 (×3): qty 1

## 2019-03-15 MED ORDER — MIDAZOLAM HCL 2 MG/2ML IJ SOLN
INTRAMUSCULAR | Status: AC
  Filled 2019-03-15: qty 4

## 2019-03-15 MED ORDER — OXYCODONE HCL 5 MG PO TABS
20.0000 mg | ORAL_TABLET | Freq: Four times a day (QID) | ORAL | Status: DC
  Administered 2019-03-16 – 2019-03-18 (×7): 20 mg via ORAL
  Filled 2019-03-15 (×8): qty 4

## 2019-03-15 MED ORDER — DEXTROSE 50 % IV SOLN
INTRAVENOUS | Status: AC
  Administered 2019-03-15: 19:00:00 25 mL via INTRAVENOUS
  Filled 2019-03-15: qty 50

## 2019-03-15 MED ORDER — ETOMIDATE 2 MG/ML IV SOLN
INTRAVENOUS | Status: AC
  Filled 2019-03-15: qty 20

## 2019-03-15 MED ORDER — DEXMEDETOMIDINE HCL IN NACL 400 MCG/100ML IV SOLN
0.4000 ug/kg/h | INTRAVENOUS | Status: DC
  Administered 2019-03-15: 0.4 ug/kg/h via INTRAVENOUS
  Administered 2019-03-15: 09:00:00 0.7 ug/kg/h via INTRAVENOUS
  Administered 2019-03-16: 05:00:00 0.9 ug/kg/h via INTRAVENOUS
  Administered 2019-03-16: 0.7 ug/kg/h via INTRAVENOUS
  Administered 2019-03-17: 0.1 ug/kg/h via INTRAVENOUS
  Administered 2019-03-17: 0.6 ug/kg/h via INTRAVENOUS
  Administered 2019-03-18: 16:00:00 0.8 ug/kg/h via INTRAVENOUS
  Administered 2019-03-19: 0.5 ug/kg/h via INTRAVENOUS
  Administered 2019-03-19: 0.9 ug/kg/h via INTRAVENOUS
  Administered 2019-03-19: 1.1 ug/kg/h via INTRAVENOUS
  Filled 2019-03-15 (×9): qty 100

## 2019-03-15 MED ORDER — ROCURONIUM BROMIDE 50 MG/5ML IV SOSY
PREFILLED_SYRINGE | INTRAVENOUS | Status: AC
  Filled 2019-03-15: qty 10

## 2019-03-15 MED ORDER — METOPROLOL TARTRATE 5 MG/5ML IV SOLN
10.0000 mg | Freq: Four times a day (QID) | INTRAVENOUS | Status: DC | PRN
  Administered 2019-03-15: 15:00:00 5 mg via INTRAVENOUS
  Administered 2019-04-12 – 2019-04-15 (×6): 10 mg via INTRAVENOUS
  Administered 2019-04-19: 5 mg via INTRAVENOUS
  Filled 2019-03-15 (×8): qty 10

## 2019-03-15 MED ORDER — FREE WATER
100.0000 mL | Freq: Three times a day (TID) | Status: DC
  Administered 2019-03-16 – 2019-04-02 (×48): 100 mL

## 2019-03-15 MED ORDER — CHLORHEXIDINE GLUCONATE 0.12 % MT SOLN
15.0000 mL | Freq: Two times a day (BID) | OROMUCOSAL | Status: DC
  Administered 2019-03-15 – 2019-03-17 (×5): 15 mL via OROMUCOSAL
  Filled 2019-03-15 (×4): qty 15

## 2019-03-15 NOTE — Progress Notes (Signed)
Attempt  X2 to place R DL PICC unsuccessful due to small veins (41%) and inability to thread guidewire.   RN Thurmond Butts made aware.

## 2019-03-15 NOTE — Progress Notes (Signed)
NAME:  Jonathan [name], MRN:  [id number], DOB:  06/27/1971, LOS: 7 ADMISSION DATE:  02/23/2019, CONSULTATION DATE: February 23, 2019 REFERRING MD: Morton Amy, CHIEF COMPLAINT: Dyspnea  Brief History   48 year old male admitted on February 23, 2019 with ARDS due to COVID-19.  Has had severe ARDS required prone ventilation for many days, developed acute kidney injury requiring continuous renal replacement therapy.  Past Medical History  Diabetes mellitus  Significant Hospital Events   4/21 admitted and immediately proned on inverse ratio PC ventilation with high PEEP to maintain saturation >85%, Started on NO at 40ppm. 4/22- Tocluzimab X 1 4/22- started CVVHD 4/21-4/24 Proned daily 4/28 -4/30 proned daily , tapered NO to off  4/30 -late evening with increased O2 demands . No paralytics required x 24hr . No vent dysyncrony . Calm on Fent /Versed. Off precedex   5/1 - Remains on CRRT , no acute issues overnight --4.8 L I/O Balance since admit . Continues to Prone on/off 16 on and 8 off . -CXR stable diffuse bilateral infiltrates (slightly improved aeration ) . , Vent .80Fio2 .  5/3 convalescent plasma  Biomarkes improving but severe ARDS persists ad D-dimer still very high (13) without improvement. On IV Heparin gtt. Growing MSSA. SEdation changed to dilaudid gtt. Continues prone 16h and 8h syupine. Currently supine at 80% fio2, peep 18. On TF. STARTED NIMBEX  5/4-5/5 -able to stay in the supine position, able to discontinue paralysis and maintain FiO2 0.50, PEEP 8-10.   5/5 hematochezia, bloody gastric aspirate  5/8 followed commands  Consults:  PCCM 4/21  Procedures:  4/21 - ETT 7.5 >> 4/21 - RIJ CVC [facility] >> 4/22 L IJ HD Cath>   Significant Diagnostic Tests:  POC U/S 4/21: Bilateral interstitial pattern with bilateral dense consolidation at bases. Echo shows low-normal LVSF. RV moderately dilated with septal shift.   Micro Data:  Blood cultures -PND SARS-CoV2  02/23/2019 - - POSITIVE HIV 4/21 -neg Quant gold 4/2 1- neg .....................Marland Kitchen resp 4/29 >>rare MSSA and yeast   Anti-covid RX and antimicrobial RX  Azithromycin 4/21 >>4/25 Ceftriaxone 4/22 >>4/25 Actemra 4/22 Hydroxychloroquine 4/21 > 4/22 ........................... Ceftriaxone 5/2 >> 5/10 Cefepime 5/10>>> Vancomycin 5/10>>>  Interim history/subjective:   Intermittent agitation overnight, CRRT continued  Objective   Blood pressure (!) 122/108, pulse (!) 116, temperature (!) 97 F (36.1 C), temperature source Axillary, resp. rate (!) 21, height 5\' 5"  (1.651 m), weight 64.6 kg, SpO2 94 %.    Vent Mode: SIMV/PC/PS FiO2 (%):  [40 %] 40 % Set Rate:  [12 bmp-30 bmp] 12 bmp Vt Set:  [490 mL] 490 mL PEEP:  [5 cmH20] 5 cmH20 Pressure Support:  [15 cmH20] 15 cmH20 Plateau Pressure:  [16 cmH20-22 cmH20] 18 cmH20   Intake/Output Summary (Last 24 hours) at 03/15/2019 0759 Last data filed at 03/15/2019 0700 Gross per 24 hour  Intake 2695.6 ml  Output 1883 ml  Net 812.6 ml   Filed Weights   03/13/19 0500 03/14/19 0354 03/15/19 0500  Weight: 65.2 kg 64.7 kg 64.6 kg    Examination:  General:  Acutely ill appearing male, sedate, NAD HENT: [place]/AT, PERRL, EOM-I and MMM PULM: CTA bilaterally CV: RRR, Nl S1/S2 and -M/R/G GI: Soft, NT, ND and +BS MSK: normal bulk and tone Neuro: Sedated on vent, withdraws ext to pain  I reviewed CXR myself, infiltrate noted  Resolved Hospital Problem list     Assessment & Plan:  ARDS due to COVID-19: improved oxygenation Extubate If fails then will  reintubate and trach in AM Increase oxycodone with the hope to d/c dilaudid drip if unable to liberate from the vent today VAP prevention Maintain PEEP/FiO2 to keep SaO2 > 88% Volume even Repeat CXR in AM if worsens  Acute kidney injury: remains oliguric Continue CVVHD per nephrology, fluid even Renal arranging perm cath with vascular BMET in AM Replace electrolytes as indicated  MSSA  pneumonia: D/C rocephin, completed course With fever cefepime and vanc restarted on 5/10  Need for sedation for ventilator synchrony: At this point can decrease sedation Continue seroquel at 50 BID Increase oxycodone to 20 mg PO q6 in an attempt to get dilaudid off, continue for now Low dose dilaudid infusion, continue to decrease Add precedex  Best practice:  Diet: tube feeding Pain/Anxiety/Delirium protocol (if indicated): yes, as above order PAD protocol dilaudid infusion, RASS goal 0 to -1 VAP protocol (if indicated): yes DVT prophylaxis: sub cutaneous heparin GI prophylaxis: PPI, H2 blocker Glucose control: monitor  Mobility: bed rest, start passive range of motion Code Status: partial code, no CPR Family Communication: updated family by phone on 5/8 Disposition: remain in ICU  Labs   CBC: Recent Labs  Lab 03/11/19 0243  03/12/19 0223  03/14/19 0956 03/14/19 1100 03/14/19 1206 03/15/19 0252 03/15/19 0441  WBC 14.6*  --  14.5*  --   --  19.0* 17.8* 17.2*  --   NEUTROABS  --   --   --   --   --   --  10.8*  --   --   HGB 8.2*   < > 8.7*   < > 9.5* 9.1* 8.0* 8.2* 8.8*  HCT 27.2*   < > 28.4*   < > 28.0* 29.7* 25.4* 26.4* 26.0*  MCV 98.2  --  98.6  --   --  97.4 98.8 98.5  --   PLT 167  --  158  --   --  215 196 193  --    < > = values in this interval not displayed.    Basic Metabolic Panel: Recent Labs  Lab 03/11/19 0243  03/12/19 0223  03/13/19 0200 03/14/19 0941 03/14/19 0956 03/14/19 1206 03/14/19 1608 03/15/19 0252 03/15/19 0300 03/15/19 0441  NA 135   < > 133*   < > 134* 136 134* 135 135  --  134* 135  K 4.0   < > 4.3   < > 4.8 5.0 4.9 5.4* 4.7  --  4.7 4.2  CL 101  --  99   < > 100 97*  --  102 101  --  99  --   CO2 24  --  25  --  22 22  --  21* 20*  --  24  --   GLUCOSE 129*  --  162*   < > 187* 157*  --  197* 164*  --  135*  --   BUN 51*  --  49*   < > 42* 61*  --  63* 65*  --  55*  --   CREATININE 2.69*  --  2.29*   < > 2.12* 3.51*  --  3.67*  3.43*  --  2.54*  --   CALCIUM 9.0  --  9.1  --  9.1 10.5*  --  9.6 9.6  --  9.4  --   MG 2.8*  --  2.8*  --  3.0* 3.4*  --   --   --  3.1*  --   --  PHOS 3.6  3.6  --  3.6  --  2.8 3.2  --   --  3.3  --  3.6  --    < > = values in this interval not displayed.   GFR: Estimated Creatinine Clearance: 30.9 mL/min (A) (by C-G formula based on SCr of 2.54 mg/dL (H)). Recent Labs  Lab 03/12/19 0223 03/14/19 1100 03/14/19 1206 03/14/19 1207 03/14/19 1643 03/15/19 0252  PROCALCITON  --   --  3.52  --   --   --   WBC 14.5* 19.0* 17.8*  --   --  17.2*  LATICACIDVEN  --   --   --  1.2 1.2  --     Liver Function Tests: Recent Labs  Lab 03/13/19 0200 03/14/19 0941 03/14/19 1206 03/14/19 1608 03/15/19 0300  AST  --   --  29  --   --   ALT  --   --  30  --   --   ALKPHOS  --   --  144*  --   --   BILITOT  --   --  0.7  --   --   PROT  --   --  7.8  --   --   ALBUMIN 3.3* 3.6 3.1* 3.5 3.4*   No results for input(s): LIPASE, AMYLASE in the last 168 hours. No results for input(s): AMMONIA in the last 168 hours.  ABG    Component Value Date/Time   PHART 7.362 03/15/2019 0441   PCO2ART 44.3 03/15/2019 0441   PO2ART 98.0 03/15/2019 0441   HCO3 25.1 03/15/2019 0441   TCO2 26 03/15/2019 0441   ACIDBASEDEF 1.0 03/14/2019 0956   O2SAT 97.0 03/15/2019 0441     Coagulation Profile: Recent Labs  Lab 03/14/19 1206  INR 1.2    Cardiac Enzymes: No results for input(s): CKTOTAL, CKMB, CKMBINDEX, TROPONINI in the last 168 hours.  HbA1C: No results found for: HGBA1C  CBG: Recent Labs  Lab 03/14/19 1123 03/14/19 1602 03/14/19 2009 03/14/19 2344 03/15/19 0350  GLUCAP 187* 180* 132* 186* 137*   The patient is critically ill with multiple organ systems failure and requires high complexity decision making for assessment and support, frequent evaluation and titration of therapies, application of advanced monitoring technologies and extensive interpretation of multiple databases.    Critical Care Time devoted to patient care services described in this note is  32  Minutes. This time reflects time of care of this signee Dr [name]. This critical care time does not reflect procedure time, or teaching time or supervisory time of PA/NP/Med student/Med Resident etc but could involve care discussion time.  Rush Farmer, M.D. [facility] - Inpatient Pulmonary/Critical Care Medicine. Pager: [phone]. After hours pager: [phone].

## 2019-03-15 NOTE — Progress Notes (Signed)
PT Cancellation Note  Patient Details Name: Jonathan [name] MRN: [id number] DOB: [birth year]   Cancelled Treatment:    Reason Eval/Treat Not Completed: Medical issues which prohibited therapy. Plans are for extubation. Will check back another time. currently for PROM level therapy.   [name] 03/15/2019, 10:54 AM [place] Pager [phone] Office [phone]

## 2019-03-15 NOTE — Procedures (Signed)
Extubation Procedure Note  Patient Details:   Name: Jonathan [name] DOB: Nov 30, 1970 MRN: [id number]   Airway Documentation:    Vent end date: 03/15/19 Vent end time: 1100   Evaluation  O2 sats: stable throughout Complications: No apparent complications Patient did tolerate procedure well. Bilateral Breath Sounds: Clear, Diminished   Yes   Order received for extubation.  Patient had positive leak test prior to extubation.  Extubated to 10 L high flow nasal cannula.  No stridor noted.  Patient able to follow commands, but would not say his name.  Strong cough present.  Will continue to monitor.  Phillis Knack [facility] 03/15/2019, 11:13 AM

## 2019-03-15 NOTE — Progress Notes (Signed)
[facility] Progress Note    Assessment/ Plan:   Pt is a48 y.o.yo maleCOVID, cardiogenic shock, hypoxia, VDRF, AKI on CRRT.  1)Anuric AKI due to ATN, cardiogenic shock: On CRRT since 4/22  - Continue CRRT.  Plans are to convert to tunneled catheter per nephro discussion with vascular on 5/10   - US renalwith no hydronephrosis. - No sign of renal recovery  2)COVID-19.  Received plasma. Per PCCM.  3) ARDS, hypoxic respiratory failure on mechanical ventilation   4)Anemia in critical illness: Transfusion as needed.  5)Encephalopathy  Subjective:    Pt with 1.9liters UF removed on 5/10 with CRRT.  Extubated earlier today.  He has had increased work of breathing with oxygen sats above 97.  He has been on net even and tolerating ok.  Not on pressors.  He has been on heparin and has clotted in the interim (lasting around 12 hours).  Hasn't gotten His tunneled line yet     Objective:   BP 107/81   Pulse (!) 118   Temp 99.3 F (37.4 C) (Axillary)   Resp (!) 22   Ht 5\' 5"  (1.651 m)   Wt 64.6 kg   SpO2 99%   BMI 23.70 kg/m   Intake/Output Summary (Last 24 hours) at 03/15/2019 1417 Last data filed at 03/15/2019 1300 Gross per 24 hour  Intake 2133.06 ml  Output 2290 ml  Net -156.94 ml   Weight change: -0.1 kg  Physical Exam: General - adult male on CRRT, extubated  Card - tachycardic Pulm - rhonchi abd - soft /nd  Ext - No edema in the lower ext LIJ temp cath for dialysis  Due to the nature of this patient's suspected COVID-19 with isolation and in keeping with efforts to prevent the spread of infection and to conserve personal protective equipment, a physical exam was not personally performed.  Patient's symptoms and exam were discussed in detail with the RN.  A chart review of other providers notes and the patient's lab work as well as review of other pertinent studies was performed.  Exam details from prior documentation were reviewed  specifically and confirmed with the bedside nurse.  Location of service: [facility]    Imaging: Dg Chest Port 1 View  Result Date: 03/14/2019 CLINICAL DATA:  Respiratory distress EXAM: PORTABLE CHEST 1 VIEW COMPARISON:  Radiograph 03/11/2011 FINDINGS: Endotracheal tube has been withdrawn to the thoracic inlet level and is now 7.5 cm from carina. (Previously 2.5 cm). Two central venous lines are unchanged. NG tube extends into the stomach. Normal cardiac silhouette. Peripheral bilateral airspace disease is slightly improved from comparison exam. IMPRESSION: 1. Endotracheal tube has been withdrawn to the thoracic inlet as described above. 2. Peripheral airspace disease slightly improved from prior. Electronically Signed   By: Suzy Bouchard M.D.   On: 03/14/2019 11:49   [place] Ekg Site Rite  Result Date: 03/14/2019 If Site Rite image not attached, placement could not be confirmed due to current cardiac rhythm.   Labs: BMET Recent Labs  Lab 03/10/19 1750 03/11/19 0243  03/12/19 0223 03/12/19 1649 03/13/19 0200 03/14/19 0941 03/14/19 0956 03/14/19 1206 03/14/19 1608 03/15/19 0300 03/15/19 0441  NA 135 135   < > 133* 135 134* 136 134* 135 135 134* 135  K 4.1 4.0   < > 4.3 4.6 4.8 5.0 4.9 5.4* 4.7 4.7 4.2  CL 101 101  --  99 100 100 97*  --  102 101 99  --   CO2  24 24  --  25  --  22 22  --  21* 20* 24  --   GLUCOSE 137* 129*  --  162* 162* 187* 157*  --  197* 164* 135*  --   BUN 53* 51*  --  49* 30* 42* 61*  --  63* 65* 55*  --   CREATININE 2.99* 2.69*  --  2.29* 1.60* 2.12* 3.51*  --  3.67* 3.43* 2.54*  --   CALCIUM 9.0 9.0  --  9.1  --  9.1 10.5*  --  9.6 9.6 9.4  --   PHOS 4.2 3.6  3.6  --  3.6  --  2.8 3.2  --   --  3.3 3.6  --    < > = values in this interval not displayed.   CBC Recent Labs  Lab 03/12/19 0223  03/14/19 1100 03/14/19 1206 03/15/19 0252 03/15/19 0441  WBC 14.5*  --  19.0* 17.8* 17.2*  --   NEUTROABS  --   --   --  10.8*  --   --   HGB 8.7*   <  > 9.1* 8.0* 8.2* 8.8*  HCT 28.4*   < > 29.7* 25.4* 26.4* 26.0*  MCV 98.6  --  97.4 98.8 98.5  --   PLT 158  --  215 196 193  --    < > = values in this interval not displayed.    Medications:    . sodium chloride   Intravenous Once  . chlorhexidine  15 mL Mouth/Throat BID  . Chlorhexidine Gluconate Cloth  6 each Topical Daily  . docusate  100 mg Per Tube BID  . etomidate      . famotidine  20 mg Per Tube Daily  . feeding supplement (PRO-STAT SUGAR FREE 64)  60 mL Per Tube TID  . feeding supplement (VITAL AF 1.2 CAL)  1,000 mL Per Tube Q24H  . free water  100 mL Per Tube Q8H  . heparin  7,500 Units Subcutaneous Q8H  . hydrALAZINE  25 mg Oral Q8H  . insulin aspart  0-15 Units Subcutaneous Q4H  . insulin aspart  6 Units Subcutaneous Q4H  . insulin glargine  15 Units Subcutaneous BID  . mouth rinse  15 mL Mouth Rinse 10 times per day  . midazolam      . oxyCODONE  20 mg Oral Q6H  . QUEtiapine  50 mg Oral BID  . rocuronium Bromide      . sennosides  5 mL Oral BID  . sodium chloride flush  10-40 mL Intracatheter Q12H    Claudia Desanctis 03/15/2019, 2:17 PM

## 2019-03-15 NOTE — Consult Note (Signed)
[place] Nurse wound follow up Patient receiving care at [place].  I spoke with his primary RN, Aaron Edelman, via the remote cart set up in the room. Wound type: HAPIs to bilateral cheeks According to Aaron Edelman, the patient ran out of hydrocolloid dressings over the weekend, and the staff placed foam dressings beneath the ETT stabilizer where the wounds are.  These dressings have not been changed today, and Aaron Edelman has not seen the wounds.  The plan for today is to possibly extubate him and if he needs re-intubation, to trach the patient.  Either way, the wounds will no longer be covered by the ETT stabilizer.  [place] nurse will touch base with RN on 5/12 to learn the outcome. Monitor the wound area(s) for worsening of condition such as: Signs/symptoms of infection,  Increase in size,  Development of or worsening of odor, Development of pain, or increased pain at the affected locations.  Notify the medical team if any of these develop.  Val Riles, RN, MSN, CWOCN, CNS-BC, pager [phone]

## 2019-03-15 NOTE — Progress Notes (Signed)
Pt extubated today and tube feedings stopped after 1000 dose of Lantus. Per dayshift RN, pt hypoglycemic this evening requiring 1/2 amp of D50. CBG recheck 171. Will hold 2000 insulin and continue to monitor CBG closely throughout the night.

## 2019-03-15 NOTE — Progress Notes (Signed)
[facility] PROGRESS NOTE    Progress Note  Jonathan [name]  FKC:[id number] DOB: 1971/06/26 DOA: 02/23/2019 PCP: Patient, No Pcp Per     Brief Narrative:   Jonathan [name] is an 48 y.o. male noninjected speaker arrived from [place] on 02/13/2019 lesion close quarters with 6 of the workers came in in respiratory distress intubated for ARDS due to COVID-19 infection started on CRRT, paralyzed and prone.  Medications: 4/22- Tocluzimab X 1 03/12/2019 on IV heparin drip. 03/07/2019, convalescent plasma 03/09/2019 paralytic stopped  Events: 02/23/2019 admitted to the ICU. 02/24/2019 started on CVVHD 4/21-4/24 Proned daily 4/28 -4/30 proned daily  03/04/2019 increase in oxygen demand require paralytic for 24 hours. 03/09/2019 hematochezia from gastric aspirate  Lines and tubes: 4/21 - ETT 7.5 >> 4/21 - RIJ CVC [facility] [facility] >> 4/22 L IJ HD Cath> 03/15/2019 HD catheter  Antibiotics: 02/23/2019-425 2020 azithromycin and Rocephin 02/23/2019-02/24/2019 03/06/2019 Rocephin   Assessment/Plan:   Acute respiratory failure with hypoxia/COVID-19/  ARDS (adult respiratory distress syndrome) ([place]) Continue vent management per PCCM on ARDS HD protocol Paralytic stop on 03/09/2019.  Biomarkers are improved Intubated on 02/23/2019 he is close to tracheostomy. He satting greater than 90% on pressure support and FiO2 40%  ATN: Continue CVVHD per renal, CVVHD stopped on 5.10.2020, 0 urine output over the last 24 hours. For tunneled HD catheter on 03/16/2019. He is currently on 100 cc of free water per tube.  Anemia due to chronic illness and possibly blood loss bleed: Hemoglobin today 10.2, with a stable hemoglobin. Continue IV famotidine. CBC shows a stable hemoglobin.  Is currently on full dose heparin continue to monitor closely.  Elevated d-dimer: Likely related to inflammatory state in the setting of hypercoagulable state.  Dimer is trending down. He was initially on IV heparin which was  stopped after GI bleed. Continue IV famotidine, now on full dose heparin. IV heparin to be stopped on 03/16/2019 for tunneled catheter  Sinus tachycardia: Continue IV metoprolol tachycardia is improving. Unlikely PE as he is on full dose anticoagulation, he is no hypocapnic on ABG.  New onset of fevers: I have personally reviewed the chest x-ray showed improved aeration of airspace disease no new infiltrates. He has Defervesce, his white blood cell count has spike. He was started empirically on IV vancomycin and cefepime. Culture data is pending. Pressure injury of skin on the nose:  Obese:   DVT prophylaxis: Heparin Family Communication:none Disposition Plan/Barrier to D/C: Keep in ICU. Code Status:     Code Status Orders  (From admission, onward)         Start     Ordered   03/02/19 1658  Limited resuscitation (code)  Continuous    Question Answer Comment  In the event of cardiac or respiratory ARREST: Initiate Code Blue, Call Rapid Response No   In the event of cardiac or respiratory ARREST: Perform CPR No   In the event of cardiac or respiratory ARREST: Perform Intubation/Mechanical Ventilation Yes   In the event of cardiac or respiratory ARREST: Use NIPPV/BiPAp only if indicated Yes   In the event of cardiac or respiratory ARREST: Administer ACLS medications if indicated Yes   In the event of cardiac or respiratory ARREST: Perform Defibrillation or Cardioversion if indicated Yes      03/02/19 1657        Code Status History    Date Active Date Inactive Code Status Order ID Comments User Context   02/23/2019 1051 03/02/2019 1657 Full Code [id number]  Kipp Brood, MD ED        IV Access:    Peripheral IV   Procedures and diagnostic studies:   Dg Chest Port 1 View  Result Date: 03/14/2019 CLINICAL DATA:  Respiratory distress EXAM: PORTABLE CHEST 1 VIEW COMPARISON:  Radiograph 03/11/2011 FINDINGS: Endotracheal tube has been withdrawn to the thoracic inlet  level and is now 7.5 cm from carina. (Previously 2.5 cm). Two central venous lines are unchanged. NG tube extends into the stomach. Normal cardiac silhouette. Peripheral bilateral airspace disease is slightly improved from comparison exam. IMPRESSION: 1. Endotracheal tube has been withdrawn to the thoracic inlet as described above. 2. Peripheral airspace disease slightly improved from prior. Electronically Signed   By: Suzy Bouchard M.D.   On: 03/14/2019 11:49   [place] Ekg Site Rite  Result Date: 03/14/2019 If Site Rite image not attached, placement could not be confirmed due to current cardiac rhythm.    Medical Consultants:    None.  Anti-Infectives:   Azithromycin 4/21 >>4/25 Ceftriaxone 4/22 >>4/25 Hydroxychloroquine 4/21 > 4/22 Vancomycin 03/14/2019 Cefepime 03/14/2019  Subjective:    Jonathan [name] intubated responsive able to follow commands.  Objective:    Vitals:   03/15/19 0430 03/15/19 0500 03/15/19 0600 03/15/19 0700  BP:  (!) 121/109 130/86 (!) 122/108  Pulse: (!) 110 (!) 114 (!) 101 (!) 116  Resp: (!) 21 (!) 28 (!) 25 (!) 21  Temp:      TempSrc:      SpO2: 100% 100% 100% 94%  Weight:  64.6 kg    Height:        Intake/Output Summary (Last 24 hours) at 03/15/2019 0744 Last data filed at 03/15/2019 0700 Gross per 24 hour  Intake 2695.6 ml  Output 1883 ml  Net 812.6 ml   Filed Weights   03/13/19 0500 03/14/19 0354 03/15/19 0500  Weight: 65.2 kg 64.7 kg 64.6 kg    Exam: General exam: Intubated in supine position. Respiratory system: Good air movement with crackles at bases. Cardiovascular system: Rate and rhythm with positive S1-S2 no JVD Gastrointestinal system: Bowel sounds soft nontender nondistended Extremities: Trace edema. Skin: Pressure ulcer on the nasolabial fold   Data Reviewed:    Labs: Basic Metabolic Panel: Recent Labs  Lab 03/11/19 0243  03/12/19 0223  03/13/19 0200 03/14/19 0941 03/14/19 0956 03/14/19 1206 03/14/19  1608 03/15/19 0252 03/15/19 0300 03/15/19 0441  NA 135   < > 133*   < > 134* 136 134* 135 135  --  134* 135  K 4.0   < > 4.3   < > 4.8 5.0 4.9 5.4* 4.7  --  4.7 4.2  CL 101  --  99   < > 100 97*  --  102 101  --  99  --   CO2 24  --  25  --  22 22  --  21* 20*  --  24  --   GLUCOSE 129*  --  162*   < > 187* 157*  --  197* 164*  --  135*  --   BUN 51*  --  49*   < > 42* 61*  --  63* 65*  --  55*  --   CREATININE 2.69*  --  2.29*   < > 2.12* 3.51*  --  3.67* 3.43*  --  2.54*  --   CALCIUM 9.0  --  9.1  --  9.1 10.5*  --  9.6 9.6  --  9.4  --  MG 2.8*  --  2.8*  --  3.0* 3.4*  --   --   --  3.1*  --   --   PHOS 3.6  3.6  --  3.6  --  2.8 3.2  --   --  3.3  --  3.6  --    < > = values in this interval not displayed.   GFR Estimated Creatinine Clearance: 30.9 mL/min (A) (by C-G formula based on SCr of 2.54 mg/dL (H)). Liver Function Tests: Recent Labs  Lab 03/13/19 0200 03/14/19 0941 03/14/19 1206 03/14/19 1608 03/15/19 0300  AST  --   --  29  --   --   ALT  --   --  30  --   --   ALKPHOS  --   --  144*  --   --   BILITOT  --   --  0.7  --   --   PROT  --   --  7.8  --   --   ALBUMIN 3.3* 3.6 3.1* 3.5 3.4*   No results for input(s): LIPASE, AMYLASE in the last 168 hours. No results for input(s): AMMONIA in the last 168 hours. Coagulation profile Recent Labs  Lab 03/14/19 1206  INR 1.2   COVID-19 Labs  Recent Labs    03/12/19 0915 03/13/19 1322  DDIMER  --  5.51*  LDH 386*  --     Lab Results  Component Value Date   SARSCOV2NAA POSITIVE (A) 02/23/2019    CBC: Recent Labs  Lab 03/11/19 0243  03/12/19 0223  03/14/19 0956 03/14/19 1100 03/14/19 1206 03/15/19 0252 03/15/19 0441  WBC 14.6*  --  14.5*  --   --  19.0* 17.8* 17.2*  --   NEUTROABS  --   --   --   --   --   --  10.8*  --   --   HGB 8.2*   < > 8.7*   < > 9.5* 9.1* 8.0* 8.2* 8.8*  HCT 27.2*   < > 28.4*   < > 28.0* 29.7* 25.4* 26.4* 26.0*  MCV 98.2  --  98.6  --   --  97.4 98.8 98.5  --   PLT  167  --  158  --   --  215 196 193  --    < > = values in this interval not displayed.   Cardiac Enzymes: No results for input(s): CKTOTAL, CKMB, CKMBINDEX, TROPONINI in the last 168 hours. BNP (last 3 results) No results for input(s): PROBNP in the last 8760 hours. CBG: Recent Labs  Lab 03/14/19 1123 03/14/19 1602 03/14/19 2009 03/14/19 2344 03/15/19 0350  GLUCAP 187* 180* 132* 186* 137*   D-Dimer: Recent Labs    03/13/19 1322  DDIMER 5.51*   Hgb A1c: No results for input(s): HGBA1C in the last 72 hours. Lipid Profile: No results for input(s): CHOL, HDL, LDLCALC, TRIG, CHOLHDL, LDLDIRECT in the last 72 hours. Thyroid function studies: No results for input(s): TSH, T4TOTAL, T3FREE, THYROIDAB in the last 72 hours.  Invalid input(s): FREET3 Anemia work up: No results for input(s): VITAMINB12, FOLATE, FERRITIN, TIBC, IRON, RETICCTPCT in the last 72 hours. Sepsis Labs: Recent Labs  Lab 03/12/19 0223 03/14/19 1100 03/14/19 1206 03/14/19 1207 03/14/19 1643 03/15/19 0252  PROCALCITON  --   --  3.52  --   --   --   WBC 14.5* 19.0* 17.8*  --   --  17.2*  LATICACIDVEN  --   --   --  1.2 1.2  --    Microbiology No results found for this or any previous visit (from the past 240 hour(s)).   Medications:   . sodium chloride   Intravenous Once  . chlorhexidine gluconate (MEDLINE KIT)  15 mL Mouth Rinse BID  . Chlorhexidine Gluconate Cloth  6 each Topical Daily  . docusate  100 mg Per Tube BID  . famotidine  20 mg Per Tube Daily  . feeding supplement (PRO-STAT SUGAR FREE 64)  60 mL Per Tube TID  . feeding supplement (VITAL AF 1.2 CAL)  1,000 mL Per Tube Q24H  . free water  200 mL Per Tube Q8H  . heparin  7,500 Units Subcutaneous Q8H  . hydrALAZINE  25 mg Oral Q8H  . insulin aspart  0-15 Units Subcutaneous Q4H  . insulin aspart  6 Units Subcutaneous Q4H  . insulin glargine  15 Units Subcutaneous BID  . mouth rinse  15 mL Mouth Rinse 10 times per day  . oxyCODONE  15  mg Oral Q6H  . QUEtiapine  50 mg Oral BID  . sennosides  5 mL Oral BID  . sodium chloride flush  10-40 mL Intracatheter Q12H   Continuous Infusions: .  prismasol BGK 4/2.5 300 mL/hr at 03/15/19 0552  .  prismasol BGK 4/2.5 500 mL/hr at 03/14/19 2302  . sodium chloride    . sodium chloride 10 mL/hr at 03/15/19 0500  . sodium chloride Stopped (03/14/19 1113)  . ceFEPime (MAXIPIME) IV Stopped (03/14/19 2155)  . heparin 10,000 units/ 20 mL infusion syringe 1,000 Units/hr (03/15/19 0733)  . HYDROmorphone 4 mg/hr (03/15/19 0500)  . prismasol BGK 4/2.5 1,500 mL/hr at 03/15/19 0614  . vancomycin       LOS: 20 days   Charlynne Cousins  [facility]  03/15/2019, 7:44 AM

## 2019-03-15 NOTE — Progress Notes (Signed)
Assisted with video chat via elink. Answered all questions via interpreter.

## 2019-03-15 NOTE — Progress Notes (Signed)
SLP Cancellation Note  Patient Details Name: Renso Swett MRN: [id number] DOB: 12-18-1970   Cancelled treatment:       Reason Eval/Treat Not Completed: Patient not medically ready. Orders received for swallow evaluation for pt who was extubated today following prolonged intubation (approximately 3 weeks). Per discussion with RN this afternoon, pt is restless, tachypneic, and with increased WOB. Will hold swallow evaluation for today and follow up on next date for readiness if he remains off the vent.   Venita Sheffield Aylin Rhoads 03/15/2019, 4:07 PM  Pollyann Glen, M.A. [place] Acute Environmental education officer [phone] Office [phone]

## 2019-03-15 NOTE — Progress Notes (Signed)
Wife- Jonathan [name] [phone] (lives in [place]) Daughter- Whiting email: [email or web address]

## 2019-03-15 NOTE — Progress Notes (Signed)
Patient tachypneic with RR 35-40 BPM with increased work of breathing. RT at bedside to NTS patient. Suctioned out moderate amount of secretions and patient's work of breathing decreased. Will continue to monitor.

## 2019-03-16 ENCOUNTER — Inpatient Hospital Stay (HOSPITAL_COMMUNITY): Payer: HRSA Program

## 2019-03-16 LAB — POCT I-STAT 7, (LYTES, BLD GAS, ICA,H+H)
Acid-base deficit: 1 mmol/L (ref 0.0–2.0)
Acid-base deficit: 1 mmol/L (ref 0.0–2.0)
Bicarbonate: 24.1 mmol/L (ref 20.0–28.0)
Bicarbonate: 23.2 mmol/L (ref 20.0–28.0)
Calcium, Ion: 1.32 mmol/L (ref 1.15–1.40)
Calcium, Ion: 1.23 mmol/L (ref 1.15–1.40)
HCT: 27 % — ABNORMAL LOW (ref 39.0–52.0)
HCT: 25 % — ABNORMAL LOW (ref 39.0–52.0)
Hemoglobin: 9.2 g/dL — ABNORMAL LOW (ref 13.0–17.0)
Hemoglobin: 8.5 g/dL — ABNORMAL LOW (ref 13.0–17.0)
O2 Saturation: 93 %
O2 Saturation: 80 %
Patient temperature: 99.2
Patient temperature: 98.6
Potassium: 5 mmol/L (ref 3.5–5.1)
Potassium: 4.7 mmol/L (ref 3.5–5.1)
Sodium: 134 mmol/L — ABNORMAL LOW (ref 135–145)
Sodium: 135 mmol/L (ref 135–145)
TCO2: 25 mmol/L (ref 22–32)
TCO2: 24 mmol/L (ref 22–32)
pCO2 arterial: 40.3 mmHg (ref 32.0–48.0)
pCO2 arterial: 33.7 mmHg (ref 32.0–48.0)
pH, Arterial: 7.386 (ref 7.350–7.450)
pH, Arterial: 7.445 (ref 7.350–7.450)
pO2, Arterial: 70 mmHg — ABNORMAL LOW (ref 83.0–108.0)
pO2, Arterial: 41 mmHg — ABNORMAL LOW (ref 83.0–108.0)

## 2019-03-16 LAB — MAGNESIUM: Magnesium: 3.1 mg/dL — ABNORMAL HIGH (ref 1.7–2.4)

## 2019-03-16 LAB — PROTIME-INR
INR: 1.2 (ref 0.8–1.2)
Prothrombin Time: 15.1 seconds (ref 11.4–15.2)

## 2019-03-16 LAB — BASIC METABOLIC PANEL
Anion gap: 12 (ref 5–15)
BUN: 37 mg/dL — ABNORMAL HIGH (ref 6–20)
CO2: 22 mmol/L (ref 22–32)
Calcium: 9.3 mg/dL (ref 8.9–10.3)
Chloride: 100 mmol/L (ref 98–111)
Creatinine, Ser: 2.28 mg/dL — ABNORMAL HIGH (ref 0.61–1.24)
GFR calc Af Amer: 38 mL/min — ABNORMAL LOW (ref >60)
GFR calc non Af Amer: 33 mL/min — ABNORMAL LOW (ref >60)
Glucose, Bld: 134 mg/dL — ABNORMAL HIGH (ref 70–99)
Potassium: 4.8 mmol/L (ref 3.5–5.1)
Sodium: 134 mmol/L — ABNORMAL LOW (ref 135–145)

## 2019-03-16 LAB — RENAL FUNCTION PANEL
Albumin: 3.3 g/dL — ABNORMAL LOW (ref 3.5–5.0)
Albumin: 3 g/dL — ABNORMAL LOW (ref 3.5–5.0)
Anion gap: 12 (ref 5–15)
Anion gap: 11 (ref 5–15)
BUN: 38 mg/dL — ABNORMAL HIGH (ref 6–20)
BUN: 36 mg/dL — ABNORMAL HIGH (ref 6–20)
CO2: 22 mmol/L (ref 22–32)
CO2: 23 mmol/L (ref 22–32)
Calcium: 9.4 mg/dL (ref 8.9–10.3)
Calcium: 8.7 mg/dL — ABNORMAL LOW (ref 8.9–10.3)
Chloride: 101 mmol/L (ref 98–111)
Chloride: 101 mmol/L (ref 98–111)
Creatinine, Ser: 2.24 mg/dL — ABNORMAL HIGH (ref 0.61–1.24)
Creatinine, Ser: 2.24 mg/dL — ABNORMAL HIGH (ref 0.61–1.24)
GFR calc Af Amer: 39 mL/min — ABNORMAL LOW (ref >60)
GFR calc Af Amer: 39 mL/min — ABNORMAL LOW (ref >60)
GFR calc non Af Amer: 33 mL/min — ABNORMAL LOW (ref >60)
GFR calc non Af Amer: 33 mL/min — ABNORMAL LOW (ref >60)
Glucose, Bld: 135 mg/dL — ABNORMAL HIGH (ref 70–99)
Glucose, Bld: 103 mg/dL — ABNORMAL HIGH (ref 70–99)
Phosphorus: 2.7 mg/dL (ref 2.5–4.6)
Phosphorus: 4.1 mg/dL (ref 2.5–4.6)
Potassium: 4.7 mmol/L (ref 3.5–5.1)
Potassium: 4.6 mmol/L (ref 3.5–5.1)
Sodium: 135 mmol/L (ref 135–145)
Sodium: 135 mmol/L (ref 135–145)

## 2019-03-16 LAB — CBC
HCT: 26.1 % — ABNORMAL LOW (ref 39.0–52.0)
Hemoglobin: 8.1 g/dL — ABNORMAL LOW (ref 13.0–17.0)
MCH: 30 pg (ref 26.0–34.0)
MCHC: 31 g/dL (ref 30.0–36.0)
MCV: 96.7 fL (ref 80.0–100.0)
Platelets: 195 10*3/uL (ref 150–400)
RBC: 2.7 MIL/uL — ABNORMAL LOW (ref 4.22–5.81)
RDW: 17.6 % — ABNORMAL HIGH (ref 11.5–15.5)
WBC: 22 10*3/uL — ABNORMAL HIGH (ref 4.0–10.5)
nRBC: 0.2 % (ref 0.0–0.2)

## 2019-03-16 LAB — GLUCOSE, CAPILLARY
Glucose-Capillary: 122 mg/dL — ABNORMAL HIGH (ref 70–99)
Glucose-Capillary: 135 mg/dL — ABNORMAL HIGH (ref 70–99)
Glucose-Capillary: 140 mg/dL — ABNORMAL HIGH (ref 70–99)
Glucose-Capillary: 77 mg/dL (ref 70–99)
Glucose-Capillary: 134 mg/dL — ABNORMAL HIGH (ref 70–99)
Glucose-Capillary: 176 mg/dL — ABNORMAL HIGH (ref 70–99)

## 2019-03-16 LAB — PHOSPHORUS: Phosphorus: 2.7 mg/dL (ref 2.5–4.6)

## 2019-03-16 LAB — APTT: aPTT: 192 seconds (ref 24–36)

## 2019-03-16 MED ORDER — ROCURONIUM BROMIDE 50 MG/5ML IV SOSY
PREFILLED_SYRINGE | INTRAVENOUS | Status: AC
  Administered 2019-03-16: 11:00:00 50 mg
  Filled 2019-03-16: qty 10

## 2019-03-16 MED ORDER — FLUCONAZOLE IN SODIUM CHLORIDE 400-0.9 MG/200ML-% IV SOLN
400.0000 mg | INTRAVENOUS | Status: DC
  Administered 2019-03-16 – 2019-03-21 (×6): 400 mg via INTRAVENOUS
  Filled 2019-03-16 (×6): qty 200

## 2019-03-16 MED ORDER — STERILE WATER FOR INJECTION IJ SOLN
INTRAMUSCULAR | Status: AC
  Filled 2019-03-16: qty 10

## 2019-03-16 MED ORDER — MIDAZOLAM HCL 2 MG/2ML IJ SOLN
5.0000 mg | Freq: Once | INTRAMUSCULAR | Status: AC
  Administered 2019-03-16: 11:00:00 5 mg via INTRAVENOUS

## 2019-03-16 MED ORDER — ONDANSETRON HCL 4 MG/2ML IJ SOLN
4.0000 mg | Freq: Once | INTRAMUSCULAR | Status: AC
  Administered 2019-03-16: 10:00:00 4 mg via INTRAVENOUS
  Filled 2019-03-16: qty 2

## 2019-03-16 MED ORDER — PROPOFOL 10 MG/ML IV BOLUS: 500.0000 mg | Freq: Once | INTRAVENOUS | Status: DC

## 2019-03-16 MED ORDER — VECURONIUM BROMIDE 10 MG IV SOLR
10.0000 mg | Freq: Once | INTRAVENOUS | Status: AC
  Administered 2019-03-16: 11:00:00 10 mg via INTRAVENOUS
  Filled 2019-03-16: qty 10

## 2019-03-16 MED ORDER — NOREPINEPHRINE 4 MG/250ML-% IV SOLN
INTRAVENOUS | Status: AC
  Filled 2019-03-16: qty 250

## 2019-03-16 MED ORDER — PIPERACILLIN-TAZOBACTAM 3.375 G IVPB 30 MIN
3.3750 g | Freq: Four times a day (QID) | INTRAVENOUS | Status: AC
  Administered 2019-03-16 – 2019-03-21 (×22): 3.375 g via INTRAVENOUS
  Filled 2019-03-16 (×23): qty 50

## 2019-03-16 MED ORDER — MIDAZOLAM HCL 2 MG/2ML IJ SOLN
INTRAMUSCULAR | Status: AC
  Administered 2019-03-16: 11:00:00 2 mg via INTRAVENOUS
  Filled 2019-03-16: qty 2

## 2019-03-16 MED ORDER — ETOMIDATE 2 MG/ML IV SOLN
40.0000 mg | Freq: Once | INTRAVENOUS | Status: AC
  Administered 2019-03-16: 11:00:00 40 mg via INTRAVENOUS
  Filled 2019-03-16: qty 20

## 2019-03-16 MED ORDER — FLUCONAZOLE IN SODIUM CHLORIDE 200-0.9 MG/100ML-% IV SOLN: 200.0000 mg | INTRAVENOUS | Status: DC

## 2019-03-16 MED ORDER — FENTANYL CITRATE (PF) 100 MCG/2ML IJ SOLN
200.0000 ug | Freq: Once | INTRAMUSCULAR | Status: AC
  Administered 2019-03-16 (×2): 200 ug via INTRAVENOUS
  Filled 2019-03-16: qty 4

## 2019-03-16 MED ORDER — SODIUM CHLORIDE 0.9 % IV SOLN: 1.5000 g | Freq: Four times a day (QID) | INTRAVENOUS | Status: DC

## 2019-03-16 MED ORDER — FENTANYL CITRATE (PF) 100 MCG/2ML IJ SOLN
INTRAMUSCULAR | Status: AC
  Administered 2019-03-16: 11:00:00 200 ug via INTRAVENOUS
  Filled 2019-03-16: qty 2

## 2019-03-16 MED ORDER — FLUCONAZOLE IN SODIUM CHLORIDE 400-0.9 MG/200ML-% IV SOLN
400.0000 mg | INTRAVENOUS | Status: DC
  Filled 2019-03-16: qty 200

## 2019-03-16 MED ORDER — PIPERACILLIN-TAZOBACTAM 3.375 G IVPB
3.3750 g | Freq: Three times a day (TID) | INTRAVENOUS | Status: DC
  Filled 2019-03-16: qty 50

## 2019-03-16 NOTE — TOC Transition Note (Signed)
Transition of Care [facility]) - CM/SW Discharge Note   Patient Details  Name: Jonathan [name] MRN: [id number] Date of Birth: 1971-06-25  [facility] CM/SW Contact:  Midge Minium RN, BSN, NCM-BC, ACM-RN [phone] Phone Number: 03/16/2019, 1:56 PM   Clinical Narrative:    CM following for dispositional needs. 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 Patient was reintubated after trach placement today. Patient continue on CRRT, with the Renal Navigator following for possible OP HD slot. CM team will continue to follow.      Barriers to Discharge: Continued Medical Work up(COVID (+); new trach; uninsured)

## 2019-03-16 NOTE — Consult Note (Signed)
[place] Nurse wound follow up Patient receiving care in [place]. Patient COVID +.  I spoke with Aaron Edelman, primary RN, he explained that the patient was extubated yesterday, but is not doing well and will undergo a tracheostomy procedure later today.  He also explained that the staff there were told that the hydrocolloid Kellie Simmering [id number]) were "on back order" and he decided to leave the areas on the cheeks open to air for now.  I have sent an email to our [employer] representative and our Material Management Assistant Director asking about the product back order information.   At this time I cannot see the wounds via the camera device in the patient room. Monitor the wound area(s) for worsening of condition such as: Signs/symptoms of infection,  Increase in size,  Development of or worsening of odor, Development of pain, or increased pain at the affected locations.  Notify the medical team if any of these develop.  Val Riles, RN, MSN, CWOCN, CNS-BC, pager [phone]

## 2019-03-16 NOTE — Progress Notes (Signed)
[facility] Progress Note    Assessment/ Plan:   Pt is a48 y.o.yo maleCOVID, cardiogenic shock, hypoxia, VDRF, AKI on CRRT.  1)Anuric AKI due to ATN, cardiogenic shock: On CRRT since 4/22  - Continue CRRT.  4K.  Keep even   - No sign of renal recovery  2)COVID-19.  Received plasma. Per PCCM.  3) ARDS, hypoxic respiratory failure on mechanical ventilation   4)Anemia in critical illness: Transfusion as needed.  5)Encephalopathy  Subjective:   Spoke with nurses.  Patient was trached emergently this AM and back on vent.  He has been on CRRT on net even per hour.  He pulled out his aline.  Hypotension     Objective:   BP (!) 89/63   Pulse 80   Temp (!) 94.6 F (34.8 C) (Axillary)   Resp (!) 22   Ht 5\' 5"  (1.651 m)   Wt 64.2 kg   SpO2 100%   BMI 23.55 kg/m   Intake/Output Summary (Last 24 hours) at 03/16/2019 1425 Last data filed at 03/16/2019 1400 Gross per 24 hour  Intake 539.31 ml  Output 163 ml  Net 376.31 ml   Weight change: -0.4 kg  Physical Exam: General - adult male intubated  Card - S1S2 Pulm - rhonchi  abd - soft /nd  Ext - trace edema  LIJ temp cath for dialysis  Due to the nature of this patient's suspected COVID-19 with isolation and in keeping with efforts to prevent the spread of infection and to conserve personal protective equipment, a physical exam was not personally performed.  Patient's symptoms and exam were discussed in detail with the RN.  A chart review of other providers notes and the patient's lab work as well as review of other pertinent studies was performed.  Exam details from prior documentation were reviewed specifically and confirmed with the bedside nurse.  Location of service: [facility]    Imaging: Dg Chest Port 1 View  Result Date: 03/16/2019 CLINICAL DATA:  Tracheostomy tube placement.  COVID-19 pneumonia. EXAM: PORTABLE CHEST 1 VIEW COMPARISON:  03/16/2019 at 4:52 a.m. FINDINGS: A  tracheostomy tube is been placed, tip 3.4 cm above the carina. Left IJ line tip projects over the SVC and azygos vein. Nasogastric tube enters the stomach. Mild improvement in the peripheral right airspace opacity compared to the earlier exam. Otherwise bilateral patchy airspace opacities persist. Heart size within normal limits. IMPRESSION: 1. Interval placement of tracheostomy tube without complicating feature. 2. Slight improvement in the peripheral airspace opacity in the right lung but otherwise stable bilateral airspace opacities compatible with ARDS or bilateral pneumonia. 3. Newly placed nasogastric tube extends into the stomach. Electronically Signed   By: Van Clines M.D.   On: 03/16/2019 13:48   Dg Chest Port 1 View  Result Date: 03/16/2019 CLINICAL DATA:  48 year old male COVID-19. In the ICU. EXAM: PORTABLE CHEST 1 VIEW COMPARISON:  03/14/2019 and earlier. FINDINGS: Portable AP semi upright view at 0450 hours. Extubated. Enteric tube removed. Left IJ approach dual lumen dialysis type catheter in stable position. Right IJ central line has been removed. Lower lung volumes with increased confluence of patchy and coarse bilateral pulmonary opacity. Increased perihilar air bronchograms. No pneumothorax. No pleural effusion is evident. Stable cardiac size and mediastinal contours. Negative visible bowel gas pattern. No acute osseous abnormality identified. IMPRESSION: 1. Extubated, enteric tube and right IJ central line removed. 2. Lower lung volumes with increased confluence of widespread pulmonary opacity since 03/14/2019. No  pneumothorax or pleural effusion. Electronically Signed   By: Genevie Ann M.D.   On: 03/16/2019 05:21    Labs: BMET Recent Labs  Lab 03/12/19 0223  03/13/19 0200 03/14/19 0941  03/14/19 1206 03/14/19 1608 03/15/19 0300 03/15/19 0441 03/15/19 1620 03/15/19 1630 03/16/19 0153 03/16/19 0450  NA 133*   < > 134* 136   < > 135 135 134* 135 134* 135 134*  135 135  K  4.3   < > 4.8 5.0   < > 5.4* 4.7 4.7 4.2 5.0 5.0 4.8  4.7 4.7  CL 99   < > 100 97*  --  102 101 99  --   --  99 100  101  --   CO2 25  --  22 22  --  21* 20* 24  --   --  23 22  22   --   GLUCOSE 162*   < > 187* 157*  --  197* 164* 135*  --   --  59* 134*  135*  --   BUN 49*   < > 42* 61*  --  63* 65* 55*  --   --  41* 37*  38*  --   CREATININE 2.29*   < > 2.12* 3.51*  --  3.67* 3.43* 2.54*  --   --  2.36* 2.28*  2.24*  --   CALCIUM 9.1  --  9.1 10.5*  --  9.6 9.6 9.4  --   --  9.8 9.3  9.4  --   PHOS 3.6  --  2.8 3.2  --   --  3.3 3.6  --   --  3.1 2.7  2.7  --    < > = values in this interval not displayed.   CBC Recent Labs  Lab 03/14/19 1100 03/14/19 1206 03/15/19 0252 03/15/19 0441 03/15/19 1620 03/16/19 0153 03/16/19 0450  WBC 19.0* 17.8* 17.2*  --   --  22.0*  --   NEUTROABS  --  10.8*  --   --   --   --   --   HGB 9.1* 8.0* 8.2* 8.8* 9.2* 8.1* 8.5*  HCT 29.7* 25.4* 26.4* 26.0* 27.0* 26.1* 25.0*  MCV 97.4 98.8 98.5  --   --  96.7  --   PLT 215 196 193  --   --  195  --     Medications:    . chlorhexidine  15 mL Mouth/Throat BID  . Chlorhexidine Gluconate Cloth  6 each Topical Daily  . docusate  100 mg Per Tube BID  . famotidine  20 mg Per Tube Daily  . feeding supplement (PRO-STAT SUGAR FREE 64)  60 mL Per Tube TID  . feeding supplement (VITAL AF 1.2 CAL)  1,000 mL Per Tube Q24H  . free water  100 mL Per Tube Q8H  . heparin  7,500 Units Subcutaneous Q8H  . hydrALAZINE  25 mg Oral Q8H  . insulin aspart  0-15 Units Subcutaneous Q4H  . insulin aspart  6 Units Subcutaneous Q4H  . insulin glargine  15 Units Subcutaneous BID  . mouth rinse  15 mL Mouth Rinse 10 times per day  . oxyCODONE  20 mg Oral Q6H  . propofol  500 mg Intravenous Once  . QUEtiapine  50 mg Oral BID  . sennosides  5 mL Oral BID  . sodium chloride flush  10-40 mL Intracatheter Q12H  . sterile water (preservative free)  Claudia Desanctis 03/16/2019, 2:25 PM

## 2019-03-16 NOTE — Procedures (Signed)
Percutaneous Tracheostomy Placement  Consent from family.  Patient sedated, paralyzed and position.  Placed on 100% FiO2 and RR matched.  Area cleaned and draped.  Lidocaine/epi injected.  Skin incision done followed by blunt dissection.  Trachea palpated then punctured, catheter passed and visualized bronchoscopically.  Wire placed and visualized.  Catheter removed.  Airway then entered and dilated.  Size 6 cuffed shiley trach placed and visualized bronchoscopically well above carina.  Good volume returns.  Patient tolerated the procedure well without complications.  Minimal blood loss.  CXR ordered and pending.  Wesam G. Yacoub, M.D. [facility]. Pager: [phone]. After hours pager: [phone].  

## 2019-03-16 NOTE — Plan of Care (Signed)
Patient very restless, labored breathing noted.  Asked patient if he was having little pain, he nods no.  Asked if he was having a lot of pain, he nods yes.  Restarted dilaudid drip for pain coverage.  Spoke with family via phone through [place] interpreter.  Updates were given.  Difficulty understanding interpreter due to background noises, but were able to communicate.  Will continue to monitor as per protocol.

## 2019-03-16 NOTE — Progress Notes (Signed)
[facility] PROGRESS NOTE    Progress Note  Jonathan [name]  WLN:[id number] DOB: [birth year] DOA: 02/23/2019 PCP: Patient, No Pcp Per     Brief Narrative:   Jonathan [name] is an 48 y.o. male noninjected speaker arrived from [place] on 02/13/2019 lesion close quarters with 6 of the workers came in in respiratory distress intubated for ARDS due to COVID-19 infection started on CRRT, paralyzed and prone.  Medications: 4/22- Tocluzimab X 1 03/12/2019 on IV heparin drip. 03/07/2019, convalescent plasma 03/09/2019 paralytic stopped  Events: 02/23/2019 admitted to the ICU. 02/24/2019 started on CVVHD 4/21-4/24 Proned daily 4/28 -4/30 proned daily  03/04/2019 increase in oxygen demand require paralytic for 24 hours. 03/09/2019 hematochezia from gastric aspirate  Lines and tubes: 4/21 - ETT >>5.11.2020 4/21 - RIJ CVC [facility] >> 4/22 L IJ HD Cath> 03/15/2019 HD catheter  Antibiotics: 02/23/2019-425 2020 azithromycin and Rocephin 02/23/2019-02/24/2019 03/06/2019 Rocephin   Assessment/Plan:   Acute respiratory failure with hypoxia/COVID-19/  ARDS (adult respiratory distress syndrome) ([place]) Continue vent management per PCCM on ARDS HD protocol. Extubated on 03/15/2019 and was stable throughout the day.  Overnight became in respiratory distress had to be placed on BiPAP. Intubated on 03/16/2019 saturating greater than 88% on BiPAP at 100% FiO2. Critical care decided to intubate the patient and perform tracheostomy, I have informed to the family and gotten their consent.  ATN: Continue CVVHD per renal, CVVHD stopped on 5.10.2020, 0 urine output over the last 48 hours For tunneled HD catheter on 03/17/2019. He is currently on 100 cc of free water per tube.  Anemia due to chronic illness and possibly blood loss bleed: Hemoglobin today 10.2, with a stable hemoglobin. Continue IV famotidine. CBC shows a stable hemoglobin.  Is currently on full dose heparin continue to monitor  closely.  Elevated d-dimer: Likely related to inflammatory state, his d-dimer has been trending down. He was initially on IV heparin which was stopped after GI bleed. Continued IV famotidine, now on full dose heparin. Prophylactic dose of heparin awaiting tunneled catheter.  Sinus tachycardia: Acute cardia significantly improved, continue to use IV metoprolol PRN.  New onset of fevers: He has Defervesce. He developed an episode of aspiration as he vomited while on the BiPAP will discontinue having started on IV Unasyn. Cont.  IV vancomycin. Culture data is pending.  Pressure injury of skin on the nose:  Obese:   DVT prophylaxis: Heparin Family Communication: Wife Lorna Few international number [phone] Disposition Plan/Barrier to D/C: Keep in ICU. Code Status:     Code Status Orders  (From admission, onward)         Start     Ordered   03/02/19 1658  Limited resuscitation (code)  Continuous    Question Answer Comment  In the event of cardiac or respiratory ARREST: Initiate Code Blue, Call Rapid Response No   In the event of cardiac or respiratory ARREST: Perform CPR No   In the event of cardiac or respiratory ARREST: Perform Intubation/Mechanical Ventilation Yes   In the event of cardiac or respiratory ARREST: Use NIPPV/BiPAp only if indicated Yes   In the event of cardiac or respiratory ARREST: Administer ACLS medications if indicated Yes   In the event of cardiac or respiratory ARREST: Perform Defibrillation or Cardioversion if indicated Yes      03/02/19 1657        Code Status History    Date Active Date Inactive Code Status Order ID Comments User Context   02/23/2019 1051 03/02/2019 1657  Full Code [id number]  Kipp Brood, MD ED        IV Access:    Peripheral IV   Procedures and diagnostic studies:   Dg Chest Port 1 View  Result Date: 03/16/2019 CLINICAL DATA:  48 year old male COVID-19. In the ICU. EXAM: PORTABLE CHEST 1 VIEW COMPARISON:   03/14/2019 and earlier. FINDINGS: Portable AP semi upright view at 0450 hours. Extubated. Enteric tube removed. Left IJ approach dual lumen dialysis type catheter in stable position. Right IJ central line has been removed. Lower lung volumes with increased confluence of patchy and coarse bilateral pulmonary opacity. Increased perihilar air bronchograms. No pneumothorax. No pleural effusion is evident. Stable cardiac size and mediastinal contours. Negative visible bowel gas pattern. No acute osseous abnormality identified. IMPRESSION: 1. Extubated, enteric tube and right IJ central line removed. 2. Lower lung volumes with increased confluence of widespread pulmonary opacity since 03/14/2019. No pneumothorax or pleural effusion. Electronically Signed   By: Genevie Ann M.D.   On: 03/16/2019 05:21   Dg Chest Port 1 View  Result Date: 03/14/2019 CLINICAL DATA:  Respiratory distress EXAM: PORTABLE CHEST 1 VIEW COMPARISON:  Radiograph 03/11/2011 FINDINGS: Endotracheal tube has been withdrawn to the thoracic inlet level and is now 7.5 cm from carina. (Previously 2.5 cm). Two central venous lines are unchanged. NG tube extends into the stomach. Normal cardiac silhouette. Peripheral bilateral airspace disease is slightly improved from comparison exam. IMPRESSION: 1. Endotracheal tube has been withdrawn to the thoracic inlet as described above. 2. Peripheral airspace disease slightly improved from prior. Electronically Signed   By: Suzy Bouchard M.D.   On: 03/14/2019 11:49   [place] Ekg Site Rite  Result Date: 03/14/2019 If Site Rite image not attached, placement could not be confirmed due to current cardiac rhythm.    Medical Consultants:    None.  Anti-Infectives:   Azithromycin 4/21 >>4/25 Ceftriaxone 4/22 >>4/25 Hydroxychloroquine 4/21 > 4/22 Vancomycin 03/14/2019 Cefepime 03/14/2019  Subjective:    Jonathan [name]   Objective:    Vitals:   03/16/19 0700 03/16/19 0730 03/16/19 0800 03/16/19  0812  BP:   116/78 (!) 149/69  Pulse: 87 73 90 91  Resp: (!) 29 20 (!) 29 (!) 32  Temp:      TempSrc:      SpO2: 97% 98% (!) 87% 100%  Weight:      Height:        Intake/Output Summary (Last 24 hours) at 03/16/2019 0840 Last data filed at 03/16/2019 0800 Gross per 24 hour  Intake 649.22 ml  Output 633 ml  Net 16.22 ml   Filed Weights   03/14/19 0354 03/15/19 0500 03/16/19 0500  Weight: 64.7 kg 64.6 kg 64.2 kg    Exam: General exam: Intubated in supine position. Respiratory system: Good air movement with crackles at bases. Cardiovascular system: Rate and rhythm with positive S1-S2 no JVD Gastrointestinal system: Bowel sounds soft nontender nondistended Extremities: Trace edema. Skin: Pressure ulcer on the nasolabial fold   Data Reviewed:    Labs: Basic Metabolic Panel: Recent Labs  Lab 03/12/19 0223  03/13/19 0200 03/14/19 0941  03/14/19 1206 03/14/19 1608 03/15/19 0252 03/15/19 0300 03/15/19 0441 03/15/19 1620 03/15/19 1630 03/16/19 0153 03/16/19 0450  NA 133*   < > 134* 136   < > 135 135  --  134* 135 134* 135 134*  135 135  K 4.3   < > 4.8 5.0   < > 5.4* 4.7  --  4.7 4.2 5.0 5.0  4.8  4.7 4.7  CL 99   < > 100 97*  --  102 101  --  99  --   --  99 100  101  --   CO2 25  --  22 22  --  21* 20*  --  24  --   --  23 22  22   --   GLUCOSE 162*   < > 187* 157*  --  197* 164*  --  135*  --   --  59* 134*  135*  --   BUN 49*   < > 42* 61*  --  63* 65*  --  55*  --   --  41* 37*  38*  --   CREATININE 2.29*   < > 2.12* 3.51*  --  3.67* 3.43*  --  2.54*  --   --  2.36* 2.28*  2.24*  --   CALCIUM 9.1  --  9.1 10.5*  --  9.6 9.6  --  9.4  --   --  9.8 9.3  9.4  --   MG 2.8*  --  3.0* 3.4*  --   --   --  3.1*  --   --   --   --  3.1*  --   PHOS 3.6  --  2.8 3.2  --   --  3.3  --  3.6  --   --  3.1 2.7  2.7  --    < > = values in this interval not displayed.   GFR Estimated Creatinine Clearance: 35.1 mL/min (A) (by C-G formula based on SCr of 2.24 mg/dL (H)).  Liver Function Tests: Recent Labs  Lab 03/14/19 1206 03/14/19 1608 03/15/19 0300 03/15/19 1630 03/16/19 0153  AST 29  --   --   --   --   ALT 30  --   --   --   --   ALKPHOS 144*  --   --   --   --   BILITOT 0.7  --   --   --   --   PROT 7.8  --   --   --   --   ALBUMIN 3.1* 3.5 3.4* 3.5 3.3*   No results for input(s): LIPASE, AMYLASE in the last 168 hours. No results for input(s): AMMONIA in the last 168 hours. Coagulation profile Recent Labs  Lab 03/14/19 1206  INR 1.2   COVID-19 Labs  Recent Labs    03/13/19 1322  DDIMER 5.51*    Lab Results  Component Value Date   SARSCOV2NAA POSITIVE (A) 02/23/2019    CBC: Recent Labs  Lab 03/12/19 0223  03/14/19 1100 03/14/19 1206 03/15/19 0252 03/15/19 0441 03/15/19 1620 03/16/19 0153 03/16/19 0450  WBC 14.5*  --  19.0* 17.8* 17.2*  --   --  22.0*  --   NEUTROABS  --   --   --  10.8*  --   --   --   --   --   HGB 8.7*   < > 9.1* 8.0* 8.2* 8.8* 9.2* 8.1* 8.5*  HCT 28.4*   < > 29.7* 25.4* 26.4* 26.0* 27.0* 26.1* 25.0*  MCV 98.6  --  97.4 98.8 98.5  --   --  96.7  --   PLT 158  --  215 196 193  --   --  195  --    < > = values in this interval not displayed.   Cardiac Enzymes:  No results for input(s): CKTOTAL, CKMB, CKMBINDEX, TROPONINI in the last 168 hours. BNP (last 3 results) No results for input(s): PROBNP in the last 8760 hours. CBG: Recent Labs  Lab 03/15/19 1856 03/15/19 1913 03/15/19 2345 03/16/19 0347 03/16/19 0738  GLUCAP 67* 171* 129* 122* 135*   D-Dimer: Recent Labs    03/13/19 1322  DDIMER 5.51*   Hgb A1c: No results for input(s): HGBA1C in the last 72 hours. Lipid Profile: No results for input(s): CHOL, HDL, LDLCALC, TRIG, CHOLHDL, LDLDIRECT in the last 72 hours. Thyroid function studies: No results for input(s): TSH, T4TOTAL, T3FREE, THYROIDAB in the last 72 hours.  Invalid input(s): FREET3 Anemia work up: No results for input(s): VITAMINB12, FOLATE, FERRITIN, TIBC, IRON,  RETICCTPCT in the last 72 hours. Sepsis Labs: Recent Labs  Lab 03/14/19 1100 03/14/19 1206 03/14/19 1207 03/14/19 1643 03/15/19 0252 03/16/19 0153  PROCALCITON  --  3.52  --   --   --   --   WBC 19.0* 17.8*  --   --  17.2* 22.0*  LATICACIDVEN  --   --  1.2 1.2  --   --    Microbiology Recent Results (from the past 240 hour(s))  Culture, blood (Routine X 2) w Reflex to ID Panel     Status: None (Preliminary result)   Collection Time: 03/14/19 11:45 AM  Result Value Ref Range Status   Specimen Description   Final    BLOOD RIGHT ANTECUBITAL Performed at [facility], [place] [address] [place], [place] [address]    Special Requests   Final    BOTTLES DRAWN AEROBIC ONLY Blood Culture adequate volume Performed at [facility] [address], [place], [place] [address]    Culture   Final    NO GROWTH < 24 HOURS Performed at [facility] [address] [place], [place] [address]    Report Status PENDING  Incomplete  Culture, blood (Routine X 2) w Reflex to ID Panel     Status: None (Preliminary result)   Collection Time: 03/14/19 11:50 AM  Result Value Ref Range Status   Specimen Description   Final    BLOOD RIGHT HAND Performed at [facility] [address], [place], [place] [address]    Special Requests   Final    BOTTLES DRAWN AEROBIC ONLY Blood Culture adequate volume Performed at [facility] [address], [place], [place] [address]    Culture   Final    NO GROWTH < 24 HOURS Performed at [facility] [address] [place], [place] [address]    Report Status PENDING  Incomplete     Medications:   . chlorhexidine  15 mL Mouth/Throat BID  . Chlorhexidine Gluconate Cloth  6 each Topical Daily  . docusate  100 mg Per Tube BID  . etomidate  40 mg Intravenous Once  . famotidine  20 mg Per Tube Daily  . feeding supplement (PRO-STAT SUGAR FREE 64)  60 mL Per Tube TID  .  feeding supplement (VITAL AF 1.2 CAL)  1,000 mL Per Tube Q24H  . fentaNYL (SUBLIMAZE) injection  200 mcg Intravenous Once  . free water  100 mL Per Tube Q8H  . heparin  7,500 Units Subcutaneous Q8H  . hydrALAZINE  25 mg Oral Q8H  . insulin aspart  0-15 Units Subcutaneous Q4H  . insulin aspart  6 Units Subcutaneous Q4H  . insulin glargine  15 Units Subcutaneous BID  .  mouth rinse  15 mL Mouth Rinse 10 times per day  . midazolam  5 mg Intravenous Once  . oxyCODONE  20 mg Oral Q6H  . propofol  500 mg Intravenous Once  . QUEtiapine  50 mg Oral BID  . sennosides  5 mL Oral BID  . sodium chloride flush  10-40 mL Intracatheter Q12H  . vecuronium  10 mg Intravenous Once   Continuous Infusions: .  prismasol BGK 4/2.5 300 mL/hr at 03/15/19 2325  .  prismasol BGK 4/2.5 500 mL/hr at 03/16/19 8206  . sodium chloride    . sodium chloride Stopped (03/14/19 1113)  . ceFEPime (MAXIPIME) IV Stopped (03/15/19 2330)  . dexmedetomidine (PRECEDEX) IV infusion 0.4 mcg/kg/hr (03/16/19 0800)  . heparin 10,000 units/ 20 mL infusion syringe 1,000 Units/hr (03/16/19 0800)  . HYDROmorphone Stopped (03/15/19 0910)  . prismasol BGK 4/2.5 1,500 mL/hr at 03/16/19 0156  . vancomycin Stopped (03/15/19 1242)     LOS: 21 days   Charlynne Cousins  [facility]  03/16/2019, 8:40 AM

## 2019-03-16 NOTE — Progress Notes (Signed)
NAME:  Jonathan [name], MRN:  [id number], DOB:  05-13-1971, LOS: 21 ADMISSION DATE:  02/23/2019, CONSULTATION DATE: February 23, 2019 REFERRING MD: Morton Amy, CHIEF COMPLAINT: Dyspnea  Brief History   48 year old male admitted on February 23, 2019 with ARDS due to COVID-19.  Has had severe ARDS required prone ventilation for many days, developed acute kidney injury requiring continuous renal replacement therapy.  Past Medical History  Diabetes mellitus  Significant Hospital Events   4/21 admitted and immediately proned on inverse ratio PC ventilation with high PEEP to maintain saturation >85%, Started on NO at 40ppm. 4/22- Tocluzimab X 1 4/22- started CVVHD 4/21-4/24 Proned daily 4/28 -4/30 proned daily , tapered NO to off  4/30 -late evening with increased O2 demands . No paralytics required x 24hr . No vent dysyncrony . Calm on Fent /Versed. Off precedex   5/1 - Remains on CRRT , no acute issues overnight --4.8 L I/O Balance since admit . Continues to Prone on/off 16 on and 8 off . -CXR stable diffuse bilateral infiltrates (slightly improved aeration ) . , Vent .80Fio2 .  5/3 convalescent plasma  Biomarkes improving but severe ARDS persists ad D-dimer still very high (13) without improvement. On IV Heparin gtt. Growing MSSA. SEdation changed to dilaudid gtt. Continues prone 16h and 8h syupine. Currently supine at 80% fio2, peep 18. On TF. STARTED NIMBEX  5/4-5/5 -able to stay in the supine position, able to discontinue paralysis and maintain FiO2 0.50, PEEP 8-10.   5/5 hematochezia, bloody gastric aspirate  5/8 followed commands  Consults:  PCCM 4/21  Procedures:  4/21 - ETT 7.5 >> 4/21 - RIJ CVC [facility] >> 4/22 L IJ HD Cath>   Significant Diagnostic Tests:  POC U/S 4/21: Bilateral interstitial pattern with bilateral dense consolidation at bases. Echo shows low-normal LVSF. RV moderately dilated with septal shift.   Micro Data:  Blood cultures -PND SARS-CoV2  02/23/2019 - - POSITIVE HIV 4/21 -neg Quant gold 4/2 1- neg .....................Marland Kitchen resp 4/29 >>rare MSSA and yeast   Anti-covid RX and antimicrobial RX  Azithromycin 4/21 >>4/25 Ceftriaxone 4/22 >>4/25 Actemra 4/22 Hydroxychloroquine 4/21 > 4/22 ........................... Ceftriaxone 5/2 >> 5/10 Cefepime 5/10>>> Vancomycin 5/10>>>  Interim history/subjective:   Evolving respiratory failure this AM  Objective   Blood pressure (!) 149/69, pulse 91, temperature (!) 97.1 F (36.2 C), temperature source Axillary, resp. rate (!) 32, height 5\' 5"  (1.651 m), weight 64.2 kg, SpO2 100 %.        Intake/Output Summary (Last 24 hours) at 03/16/2019 0831 Last data filed at 03/16/2019 0800 Gross per 24 hour  Intake 649.22 ml  Output 633 ml  Net 16.22 ml   Filed Weights   03/14/19 0354 03/15/19 0500 03/16/19 0500  Weight: 64.7 kg 64.6 kg 64.2 kg   Examination:  General:  Acutely ill appearing male, moderate respiratory distress HENT: [place]/AT, PERRL, EOM-I and MMM PULM: Transmitted upper airway sounds bilaterally CV: Regular but tachycardic, Nl S1/S2 and -M/R/G GI: Soft, NT, ND and +BS MSK: normal bulk and tone Neuro: Arousable but lethargic  I reviewed CXR myself,   Resolved Hospital Problem list     Assessment & Plan:  ARDS due to COVID-19: improved oxygenation Reintubate Trach today Continue oxycodone after trach and placement of NGT VAP prevention Maintain PEEP/FiO2 to keep SaO2 > 88% Volume even CXR today Full vent support post intubation and trach Bridge with BiPAP now  Acute kidney injury: remains oliguric Continue CVVHD per nephrology, fluid even Renal arranging perm  cath with vascular likely in AM BMET in AM Replace electrolytes as indicated  MSSA pneumonia: D/C rocephin, completed course With fever cefepime and vanc restarted on 5/10  Need for sedation for ventilator synchrony: At this point can decrease sedation Continue seroquel at 50 BID once able to  get PO access Increase oxycodone to 20 mg PO q6 in an attempt to get dilaudid off, continue for now Post trach will start PRN dilaudid for pain D/C precedex post trach  Best practice:  Diet: tube feeding Pain/Anxiety/Delirium protocol (if indicated): yes, as above order PAD protocol dilaudid infusion, RASS goal 0 to -1 VAP protocol (if indicated): yes DVT prophylaxis: sub cutaneous heparin GI prophylaxis: PPI, H2 blocker Glucose control: monitor  Mobility: bed rest, start passive range of motion Code Status: partial code, no CPR Family Communication: updated family by phone on 5/8 Disposition: remain in ICU  Labs   CBC: Recent Labs  Lab 03/12/19 0223  03/14/19 1100 03/14/19 1206 03/15/19 0252 03/15/19 0441 03/15/19 1620 03/16/19 0153 03/16/19 0450  WBC 14.5*  --  19.0* 17.8* 17.2*  --   --  22.0*  --   NEUTROABS  --   --   --  10.8*  --   --   --   --   --   HGB 8.7*   < > 9.1* 8.0* 8.2* 8.8* 9.2* 8.1* 8.5*  HCT 28.4*   < > 29.7* 25.4* 26.4* 26.0* 27.0* 26.1* 25.0*  MCV 98.6  --  97.4 98.8 98.5  --   --  96.7  --   PLT 158  --  215 196 193  --   --  195  --    < > = values in this interval not displayed.    Basic Metabolic Panel: Recent Labs  Lab 03/12/19 0223  03/13/19 0200 03/14/19 0941  03/14/19 1206 03/14/19 1608 03/15/19 0252 03/15/19 0300 03/15/19 0441 03/15/19 1620 03/15/19 1630 03/16/19 0153 03/16/19 0450  NA 133*   < > 134* 136   < > 135 135  --  134* 135 134* 135 134*  135 135  K 4.3   < > 4.8 5.0   < > 5.4* 4.7  --  4.7 4.2 5.0 5.0 4.8  4.7 4.7  CL 99   < > 100 97*  --  102 101  --  99  --   --  99 100  101  --   CO2 25  --  22 22  --  21* 20*  --  24  --   --  23 22  22   --   GLUCOSE 162*   < > 187* 157*  --  197* 164*  --  135*  --   --  59* 134*  135*  --   BUN 49*   < > 42* 61*  --  63* 65*  --  55*  --   --  41* 37*  38*  --   CREATININE 2.29*   < > 2.12* 3.51*  --  3.67* 3.43*  --  2.54*  --   --  2.36* 2.28*  2.24*  --   CALCIUM 9.1   --  9.1 10.5*  --  9.6 9.6  --  9.4  --   --  9.8 9.3  9.4  --   MG 2.8*  --  3.0* 3.4*  --   --   --  3.1*  --   --   --   --  3.1*  --   PHOS 3.6  --  2.8 3.2  --   --  3.3  --  3.6  --   --  3.1 2.7  2.7  --    < > = values in this interval not displayed.   GFR: Estimated Creatinine Clearance: 35.1 mL/min (A) (by C-G formula based on SCr of 2.24 mg/dL (H)). Recent Labs  Lab 03/14/19 1100 03/14/19 1206 03/14/19 1207 03/14/19 1643 03/15/19 0252 03/16/19 0153  PROCALCITON  --  3.52  --   --   --   --   WBC 19.0* 17.8*  --   --  17.2* 22.0*  LATICACIDVEN  --   --  1.2 1.2  --   --     Liver Function Tests: Recent Labs  Lab 03/14/19 1206 03/14/19 1608 03/15/19 0300 03/15/19 1630 03/16/19 0153  AST 29  --   --   --   --   ALT 30  --   --   --   --   ALKPHOS 144*  --   --   --   --   BILITOT 0.7  --   --   --   --   PROT 7.8  --   --   --   --   ALBUMIN 3.1* 3.5 3.4* 3.5 3.3*   No results for input(s): LIPASE, AMYLASE in the last 168 hours. No results for input(s): AMMONIA in the last 168 hours.  ABG    Component Value Date/Time   PHART 7.445 03/16/2019 0450   PCO2ART 33.7 03/16/2019 0450   PO2ART 41.0 (L) 03/16/2019 0450   HCO3 23.2 03/16/2019 0450   TCO2 24 03/16/2019 0450   ACIDBASEDEF 1.0 03/16/2019 0450   O2SAT 80.0 03/16/2019 0450     Coagulation Profile: Recent Labs  Lab 03/14/19 1206  INR 1.2    Cardiac Enzymes: No results for input(s): CKTOTAL, CKMB, CKMBINDEX, TROPONINI in the last 168 hours.  HbA1C: No results found for: HGBA1C  CBG: Recent Labs  Lab 03/15/19 1856 03/15/19 1913 03/15/19 2345 03/16/19 0347 03/16/19 0738  GLUCAP 67* 171* 129* 122* 135*   The patient is critically ill with multiple organ systems failure and requires high complexity decision making for assessment and support, frequent evaluation and titration of therapies, application of advanced monitoring technologies and extensive interpretation of multiple databases.    Critical Care Time devoted to patient care services described in this note is  90  Minutes. This time reflects time of care of this signee Dr [name]. This critical care time does not reflect procedure time, or teaching time or supervisory time of PA/NP/Med student/Med Resident etc but could involve care discussion time.  Rush Farmer, M.D. [facility] Pulmonary/Critical Care Medicine. Pager: [phone]. After hours pager: [phone].

## 2019-03-16 NOTE — Progress Notes (Signed)
Stop dilaudid gtt, remains inline for pain control.  Patient resting better.  Breathing not as labored as initially upon beginning of shift.

## 2019-03-16 NOTE — Procedures (Signed)
Bedside Tracheostomy Insertion Procedure Note   Patient Details:   Name: Jonathan [name] DOB: 1970/11/28 MRN: [id number]  Procedure: Tracheostomy  Pre Procedure Assessment: ET Tube Size: 7.5 ET Tube secured at lip (cm): 24 Bite block in place: No Breath Sounds: Clear and Diminished  Post Procedure Assessment: BP 107/71   Pulse (!) 103   Temp (!) 94.6 F (34.8 C) (Axillary)   Resp 20   Ht 5\' 5"  (1.651 m)   Wt 64.2 kg   SpO2 100%   BMI 23.55 kg/m  O2 sats: stable throughout Complications: No apparent complications Patient did tolerate procedure well Tracheostomy Brand:Shiley Tracheostomy Style:Cuffed Tracheostomy Size: 6.0 Tracheostomy Secured GKM:[id number], velcro  Tracheostomy Placement Confirmation:Trach cuff visualized and in place and Chest X ray ordered for placement    Mickie Hillier 03/16/2019, 12:10 PM

## 2019-03-16 NOTE — Progress Notes (Addendum)
Pharmacy Antibiotic Note  Jonathan [name] is a 48 y.o. male admitted on 02/23/2019 with sepsis.  Currently on IV vancomycin and cefepime. CRRT remains ongoing. Patient with worsening respiratory status requiring intubation and tracheostomy today. WBC has trended up. Broadening Cefepime to Zosyn and adding fluconazole since trach aspirate from 4/29 had grown candida albicans which is usually colonizer but some concern for true infection since patient received Actemra   Plan: Continue vancomycin 750mg  IV Q24h Cefepime switched to Zosyn 3.375 gm IV Q 6 hours per MD Starting fluconazole 400 mg daily per MD  Monitor clinical picture, renal function, vanc levels prn F/U C&S, abx deescalation / LOT  Height: 5\' 5"  (165.1 cm) Weight: 141 lb 8.6 oz (64.2 kg) IBW/kg (Calculated) : 61.5  Temp (24hrs), Avg:97.2 F (36.2 C), Min:94.6 F (34.8 C), Max:99.3 F (37.4 C)  Recent Labs  Lab 03/12/19 0223  03/14/19 1100 03/14/19 1206 03/14/19 1207 03/14/19 1608 03/14/19 1643 03/15/19 0252 03/15/19 0300 03/15/19 1630 03/16/19 0153  WBC 14.5*  --  19.0* 17.8*  --   --   --  17.2*  --   --  22.0*  CREATININE 2.29*   < >  --  3.67*  --  3.43*  --   --  2.54* 2.36* 2.28*  2.24*  LATICACIDVEN  --   --   --   --  1.2  --  1.2  --   --   --   --    < > = values in this interval not displayed.    Estimated Creatinine Clearance: 35.1 mL/min (A) (by C-G formula based on SCr of 2.24 mg/dL (H)).    Plaquenil 4/21>>4/22 Azithromycin 4/21>>4/25; 5/2>>5/8 Rocephin 4/21>>4/25; 5/2>>(5/9) 4/22 Tocilizumab x 1  5/3 convalescent plasma x 1 Vancomycin 5/10>> Cefepime 5/10>>5/12 Zosyn 5/12>> Fluconazole 5/12>>    4/21 COVID: positive 4/21 MRSA: neg 4/21 Blood cx: ngF 4/22 TB: neg  4/29 TA: MSSA, rare candida albicans 5/10 BCx2>> ngtd   Thank you for allowing pharmacy to be a part of this patient's care.  Albertina Parr, PharmD., BCPS Clinical Pharmacist Clinical phone for 03/16/19 until 5pm:  [phone]

## 2019-03-16 NOTE — Progress Notes (Signed)
SLP Note:  Per chart review, pt was reintuabted and then trached today for evolving respiratory failure. Orders for SLP eval and treat for PMSV and swallowing received. Will follow pt closely for readiness for SLP interventions as appropriate.    Pollyann Glen, M.A. [place] Acute Environmental education officer [phone] Office [phone]

## 2019-03-16 NOTE — Progress Notes (Signed)
Inpatient Diabetes Program Recommendations  AACE/ADA: New Consensus Statement on Inpatient Glycemic Control (2015)  Target Ranges:  Prepandial:   less than 140 mg/dL      Peak postprandial:   less than 180 mg/dL (1-2 hours)      Critically ill patients:  140 - 180 mg/dL   Results for Jonathan [name], Jonathan [name] (MRN [id number]) as of 03/16/2019 09:35  Ref. Range 03/15/2019 08:14 03/15/2019 11:30 03/15/2019 16:21 03/15/2019 18:56 03/15/2019 19:13 03/15/2019 23:45 03/16/2019 03:47 03/16/2019 07:38  Glucose-Capillary Latest Ref Range: 70 - 99 mg/dL 148 (H) 97 70 67 (L) 171 (H) 129 (H) 122 (H) 135 (H)    Review of Glycemic Control  Diabetes history: DM 2  Current orders for Inpatient glycemic control: On Lantus 15 units bid, Novolog 0-15 units Q4 and Nov 6 units Q4  Inpatient Diabetes Program Recommendations:    Hypoglycemia yesterday evening at 1856 pm. Tube Feeds 40 ml/hour. Renal function elevated.  Patient did NOT get second dose of Lantus yesterday.   Please consider lowering Lantus to 12-15 units to a Daily dose.  May also need to consider lowering Correction scale to Novolog Sensitive Correction 0-9 units based on renal function.  Noted Tube feeds held since midnight for procedure.   Thanks,  Tama Headings RN, MSN, BC-ADM Inpatient Diabetes Coordinator Team Pager [phone] (8a-5p)

## 2019-03-16 NOTE — Procedures (Signed)
Intubation Procedure Note Jonathan [name] [id number] [birth year]  Procedure: Intubation Indications: Respiratory insufficiency  Procedure Details Consent: Risks of procedure as well as the alternatives and risks of each were explained to the (patient/caregiver).  Consent for procedure obtained. Time Out: Verified patient identification, verified procedure, site/side was marked, verified correct patient position, special equipment/implants available, medications/allergies/relevent history reviewed, required imaging and test results available.  Performed  Maximum sterile technique was used including antiseptics, cap, gloves, gown, hand hygiene, mask and sheet.  MAC    Evaluation Hemodynamic Status: BP stable throughout; O2 sats: stable throughout Patient's Current Condition: stable Complications: No apparent complications Patient did tolerate procedure well. Chest X-ray ordered to verify placement.  CXR: pending.   Jonathan [name] 03/16/2019

## 2019-03-16 NOTE — Progress Notes (Addendum)
PT Cancellation Note  Patient Details Name: Jonathan [name] MRN: [id number] DOB: 1971-05-17   Cancelled Treatment:    Reason Eval/Treat Not Completed: Medical issues which prohibited therapy Tracheostomy performed.PT discontinued. Please reorder when  Indicated.Tresa Endo PT Acute Rehabilitation Services Pager [phone] Office [phone]    [name] 03/16/2019, 12:05 PM

## 2019-03-17 ENCOUNTER — Inpatient Hospital Stay (HOSPITAL_COMMUNITY): Payer: HRSA Program

## 2019-03-17 ENCOUNTER — Encounter (HOSPITAL_COMMUNITY)
Admission: EM | Disposition: A | Discharge status: Rehabilitation Facility | Payer: Self-pay | Source: Home / Self Care | Attending: Internal Medicine

## 2019-03-17 DIAGNOSIS — N179 Acute kidney failure, unspecified: Secondary | ICD-10-CM

## 2019-03-17 DIAGNOSIS — Z930 Tracheostomy status: Secondary | ICD-10-CM

## 2019-03-17 DIAGNOSIS — U071 COVID-19: Secondary | ICD-10-CM

## 2019-03-17 HISTORY — PX: INSERTION OF DIALYSIS CATHETER: SHX1324

## 2019-03-17 LAB — POCT I-STAT 7, (LYTES, BLD GAS, ICA,H+H)
Acid-Base Excess: 1 mmol/L (ref 0.0–2.0)
Bicarbonate: 25.1 mmol/L (ref 20.0–28.0)
Calcium, Ion: 1.21 mmol/L (ref 1.15–1.40)
HCT: 23 % — ABNORMAL LOW (ref 39.0–52.0)
Hemoglobin: 7.8 g/dL — ABNORMAL LOW (ref 13.0–17.0)
O2 Saturation: 87 %
Patient temperature: 37.2
Potassium: 4.5 mmol/L (ref 3.5–5.1)
Sodium: 134 mmol/L — ABNORMAL LOW (ref 135–145)
TCO2: 26 mmol/L (ref 22–32)
pCO2 arterial: 38.6 mmHg (ref 32.0–48.0)
pH, Arterial: 7.422 (ref 7.350–7.450)
pO2, Arterial: 52 mmHg — ABNORMAL LOW (ref 83.0–108.0)

## 2019-03-17 LAB — RENAL FUNCTION PANEL
Albumin: 2.7 g/dL — ABNORMAL LOW (ref 3.5–5.0)
Albumin: 2.5 g/dL — ABNORMAL LOW (ref 3.5–5.0)
Anion gap: 10 (ref 5–15)
Anion gap: 9 (ref 5–15)
BUN: 42 mg/dL — ABNORMAL HIGH (ref 6–20)
BUN: 45 mg/dL — ABNORMAL HIGH (ref 6–20)
CO2: 24 mmol/L (ref 22–32)
CO2: 24 mmol/L (ref 22–32)
Calcium: 8.6 mg/dL — ABNORMAL LOW (ref 8.9–10.3)
Calcium: 8.1 mg/dL — ABNORMAL LOW (ref 8.9–10.3)
Chloride: 100 mmol/L (ref 98–111)
Chloride: 102 mmol/L (ref 98–111)
Creatinine, Ser: 2.18 mg/dL — ABNORMAL HIGH (ref 0.61–1.24)
Creatinine, Ser: 2.3 mg/dL — ABNORMAL HIGH (ref 0.61–1.24)
GFR calc Af Amer: 40 mL/min — ABNORMAL LOW (ref >60)
GFR calc Af Amer: 38 mL/min — ABNORMAL LOW (ref >60)
GFR calc non Af Amer: 35 mL/min — ABNORMAL LOW (ref >60)
GFR calc non Af Amer: 32 mL/min — ABNORMAL LOW (ref >60)
Glucose, Bld: 204 mg/dL — ABNORMAL HIGH (ref 70–99)
Glucose, Bld: 194 mg/dL — ABNORMAL HIGH (ref 70–99)
Phosphorus: 2.3 mg/dL — ABNORMAL LOW (ref 2.5–4.6)
Phosphorus: 4.2 mg/dL (ref 2.5–4.6)
Potassium: 4.5 mmol/L (ref 3.5–5.1)
Potassium: 4.2 mmol/L (ref 3.5–5.1)
Sodium: 134 mmol/L — ABNORMAL LOW (ref 135–145)
Sodium: 135 mmol/L (ref 135–145)

## 2019-03-17 LAB — CBC
HCT: 25 % — ABNORMAL LOW (ref 39.0–52.0)
Hemoglobin: 7.3 g/dL — ABNORMAL LOW (ref 13.0–17.0)
MCH: 29.3 pg (ref 26.0–34.0)
MCHC: 29.2 g/dL — ABNORMAL LOW (ref 30.0–36.0)
MCV: 100.4 fL — ABNORMAL HIGH (ref 80.0–100.0)
Platelets: 181 10*3/uL (ref 150–400)
RBC: 2.49 MIL/uL — ABNORMAL LOW (ref 4.22–5.81)
RDW: 18.6 % — ABNORMAL HIGH (ref 11.5–15.5)
WBC: 14.7 10*3/uL — ABNORMAL HIGH (ref 4.0–10.5)
nRBC: 1 % — ABNORMAL HIGH (ref 0.0–0.2)

## 2019-03-17 LAB — MAGNESIUM: Magnesium: 3 mg/dL — ABNORMAL HIGH (ref 1.7–2.4)

## 2019-03-17 LAB — GLUCOSE, CAPILLARY
Glucose-Capillary: 162 mg/dL — ABNORMAL HIGH (ref 70–99)
Glucose-Capillary: 138 mg/dL — ABNORMAL HIGH (ref 70–99)
Glucose-Capillary: 178 mg/dL — ABNORMAL HIGH (ref 70–99)
Glucose-Capillary: 173 mg/dL — ABNORMAL HIGH (ref 70–99)
Glucose-Capillary: 121 mg/dL — ABNORMAL HIGH (ref 70–99)
Glucose-Capillary: 145 mg/dL — ABNORMAL HIGH (ref 70–99)

## 2019-03-17 LAB — APTT: aPTT: 128 seconds — ABNORMAL HIGH (ref 24–36)

## 2019-03-17 SURGERY — INSERTION OF DIALYSIS CATHETER
Anesthesia: Moderate Sedation | Site: Neck

## 2019-03-17 MED ORDER — PRO-STAT SUGAR FREE PO LIQD
60.0000 mL | Freq: Two times a day (BID) | ORAL | Status: DC
  Administered 2019-03-17 – 2019-03-30 (×26): 60 mL
  Filled 2019-03-17 (×26): qty 60

## 2019-03-17 MED ORDER — FENTANYL CITRATE (PF) 100 MCG/2ML IJ SOLN
200.0000 ug | Freq: Once | INTRAMUSCULAR | Status: AC
  Administered 2019-03-17: 09:00:00 100 ug via INTRAVENOUS
  Filled 2019-03-17: qty 4

## 2019-03-17 MED ORDER — ROCURONIUM BROMIDE 50 MG/5ML IV SOSY
PREFILLED_SYRINGE | INTRAVENOUS | Status: AC
  Administered 2019-03-17: 80 mg
  Filled 2019-03-17: qty 10

## 2019-03-17 MED ORDER — QUETIAPINE FUMARATE 50 MG PO TABS
75.0000 mg | ORAL_TABLET | Freq: Two times a day (BID) | ORAL | Status: DC
  Administered 2019-03-17 – 2019-03-22 (×10): 75 mg
  Filled 2019-03-17 (×10): qty 1

## 2019-03-17 MED ORDER — LIDOCAINE HCL (PF) 1 % IJ SOLN
INTRAMUSCULAR | Status: DC | PRN
  Administered 2019-03-17: 30 mL

## 2019-03-17 MED ORDER — NOREPINEPHRINE 4 MG/250ML-% IV SOLN
0.0000 ug/min | INTRAVENOUS | Status: DC
  Administered 2019-03-17: 10 ug/min via INTRAVENOUS
  Administered 2019-03-17: 09:00:00 4 ug/min via INTRAVENOUS
  Administered 2019-03-18 – 2019-03-19 (×2): 2 ug/min via INTRAVENOUS
  Filled 2019-03-17 (×3): qty 250

## 2019-03-17 MED ORDER — SODIUM CHLORIDE 0.9 % IV BOLUS
500.0000 mL | Freq: Once | INTRAVENOUS | Status: AC
  Administered 2019-03-17: 15:00:00 500 mL via INTRAVENOUS

## 2019-03-17 MED ORDER — MIDAZOLAM HCL 2 MG/2ML IJ SOLN
6.0000 mg | Freq: Once | INTRAMUSCULAR | Status: AC
  Administered 2019-03-17: 09:00:00 4 mg via INTRAVENOUS

## 2019-03-17 MED ORDER — 0.9 % SODIUM CHLORIDE (POUR BTL) OPTIME
TOPICAL | Status: DC | PRN
  Administered 2019-03-17: 10:00:00 1000 mL

## 2019-03-17 MED ORDER — SODIUM PHOSPHATES 45 MMOLE/15ML IV SOLN
20.0000 mmol | Freq: Once | INTRAVENOUS | Status: AC
  Administered 2019-03-17: 14:00:00 20 mmol via INTRAVENOUS
  Filled 2019-03-17: qty 6.67

## 2019-03-17 MED ORDER — ETOMIDATE 2 MG/ML IV SOLN
INTRAVENOUS | Status: AC
  Filled 2019-03-17: qty 10

## 2019-03-17 MED ORDER — HEPARIN SODIUM (PORCINE) 1000 UNIT/ML IJ SOLN
INTRAMUSCULAR | Status: DC | PRN
  Administered 2019-03-17: 3400 [IU]

## 2019-03-17 MED ORDER — VITAL 1.5 CAL PO LIQD
1000.0000 mL | ORAL | Status: DC
  Administered 2019-03-17 – 2019-03-29 (×10): 1000 mL
  Filled 2019-03-17 (×16): qty 1000

## 2019-03-17 MED ORDER — SODIUM CHLORIDE 0.9 % IV SOLN
INTRAVENOUS | Status: DC | PRN
  Administered 2019-03-17: 500 mL

## 2019-03-17 MED ORDER — FENTANYL CITRATE (PF) 100 MCG/2ML IJ SOLN
INTRAMUSCULAR | Status: AC
  Filled 2019-03-17: qty 2

## 2019-03-17 MED ORDER — MIDAZOLAM HCL 2 MG/2ML IJ SOLN
INTRAMUSCULAR | Status: AC
  Administered 2019-03-18: 15:00:00 2 mg via INTRAVENOUS
  Filled 2019-03-17: qty 4

## 2019-03-17 SURGICAL SUPPLY — 45 items
BAG DECANTER FOR FLEXI CONT (MISCELLANEOUS) ×2 IMPLANT
BIOPATCH RED 1 DISK 7.0 (GAUZE/BANDAGES/DRESSINGS) ×2 IMPLANT
CATH PALINDROME RT-P 15FX19CM (CATHETERS) IMPLANT
CATH PALINDROME RT-P 15FX23CM (CATHETERS) ×2 IMPLANT
CATH PALINDROME RT-P 15FX28CM (CATHETERS) IMPLANT
CATH PALINDROME RT-P 15FX55CM (CATHETERS) IMPLANT
CATH STRAIGHT 5FR 65CM (CATHETERS) IMPLANT
CHLORAPREP W/TINT 26ML (MISCELLANEOUS) ×2 IMPLANT
COVER PROBE W GEL 5X96 (DRAPES) IMPLANT
COVER SURGICAL LIGHT HANDLE (MISCELLANEOUS) ×2 IMPLANT
COVER WAND RF STERILE (DRAPES) IMPLANT
DECANTER SPIKE VIAL GLASS SM (MISCELLANEOUS) ×2 IMPLANT
DERMABOND ADVANCED (GAUZE/BANDAGES/DRESSINGS) ×1
DERMABOND ADVANCED .7 DNX12 (GAUZE/BANDAGES/DRESSINGS) ×1 IMPLANT
DRAPE C-ARM 42X72 X-RAY (DRAPES) ×2 IMPLANT
DRAPE CHEST BREAST 15X10 FENES (DRAPES) ×2 IMPLANT
GAUZE 4X4 16PLY RFD (DISPOSABLE) ×2 IMPLANT
GAUZE SPONGE 4X4 12PLY STRL LF (GAUZE/BANDAGES/DRESSINGS) ×2 IMPLANT
GLOVE BIO SURGEON STRL SZ 6.5 (GLOVE) ×2 IMPLANT
GLOVE BIO SURGEON STRL SZ7.5 (GLOVE) ×4 IMPLANT
GLOVE BIOGEL PI IND STRL 6.5 (GLOVE) ×3 IMPLANT
GLOVE BIOGEL PI INDICATOR 6.5 (GLOVE) ×3
GOWN STRL REUS W/ TWL LRG LVL3 (GOWN DISPOSABLE) ×2 IMPLANT
GOWN STRL REUS W/ TWL LRG LVL4 (GOWN DISPOSABLE) ×4 IMPLANT
GOWN STRL REUS W/TWL LRG LVL3 (GOWN DISPOSABLE) ×2
GOWN STRL REUS W/TWL LRG LVL4 (GOWN DISPOSABLE) ×4
KIT BASIN OR (CUSTOM PROCEDURE TRAY) ×2 IMPLANT
KIT TURNOVER KIT B (KITS) ×2 IMPLANT
NEEDLE 18GX1X1/2 (RX/OR ONLY) (NEEDLE) ×2 IMPLANT
NEEDLE HYPO 25GX1X1/2 BEV (NEEDLE) ×2 IMPLANT
NS IRRIG 1000ML POUR BTL (IV SOLUTION) ×2 IMPLANT
PACK SURGICAL SETUP 50X90 (CUSTOM PROCEDURE TRAY) ×2 IMPLANT
PAD ARMBOARD 7.5X6 YLW CONV (MISCELLANEOUS) ×4 IMPLANT
SET MICROPUNCTURE 5F STIFF (MISCELLANEOUS) IMPLANT
SUT ETHILON 3 0 PS 1 (SUTURE) ×2 IMPLANT
SUT VICRYL 4-0 PS2 18IN ABS (SUTURE) ×2 IMPLANT
SYR 10ML LL (SYRINGE) ×4 IMPLANT
SYR 20CC LL (SYRINGE) ×4 IMPLANT
SYR 5ML LL (SYRINGE) ×2 IMPLANT
SYR CONTROL 10ML LL (SYRINGE) ×2 IMPLANT
TAPE CLOTH SURG 4X10 WHT LF (GAUZE/BANDAGES/DRESSINGS) ×2 IMPLANT
TOWEL GREEN STERILE (TOWEL DISPOSABLE) ×4 IMPLANT
TOWEL GREEN STERILE FF (TOWEL DISPOSABLE) ×2 IMPLANT
WATER STERILE IRR 1000ML POUR (IV SOLUTION) ×2 IMPLANT
WIRE AMPLATZ SS-J .035X180CM (WIRE) IMPLANT

## 2019-03-17 NOTE — Progress Notes (Signed)
Nutrition Follow-up RD working remotely.  DOCUMENTATION CODES:   Not applicable  INTERVENTION:  To better meet re-estimated needs, change TF formula to Vital 1.5 at 45 ml/h  Pro-stat 60 ml BID  Provides 2020 kcal, 133 gm protein, 825 ml free water daily  NUTRITION DIAGNOSIS:   Inadequate oral intake related to inability to eat as evidenced by NPO status.  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 Admitted with fever, cough, AMS, and respiratory distress. COVID-19 positive. S/P convalescent plasma transfusion on 5/3. Transferred to [place] on 5/6.  Extubated 5/11. S/P emergent tracheostomy 5/12, placed back on ventilator. OGT removed and NGT placed. S/P HD perm cath placement earlier today. TF held this morning for procedure. Has been tolerating TF well.   Remains on CRRT.  Patient remains on ventilator support MV: 11.9 L/min Temp (24hrs), Avg:99.6 F (37.6 C), Min:96.3 F (35.7 C), Max:100.9 F (38.3 C)   Labs reviewed. Sodium 134 (L), BUN 42 (H), creatinine 2.18 (H), phosphorus 2.3 (L), potassium 4.5 WNL CBG's: 162-138  Medications reviewed and include Colace, Novolog, Lantus, Levophed. Free water flushes 100 ml every 8 hours.  Weight trending down.  Diet Order:   Diet Order            Diet NPO time specified Except for: Sips with Meds  Diet effective midnight              EDUCATION NEEDS:   No education needs have been identified at this time  Skin:  Skin Assessment: Skin Integrity Issues: Skin Integrity Issues:: DTI, Stage II DTI: L and R side of face Stage II: nose  Last BM:  5/12 (type 6)  Height:   Ht Readings from Last 1 Encounters:  03/13/19 '5\' 5"'  (1.651 m)    Weight:   Wt Readings from Last 1 Encounters:   03/16/19 64.2 kg    Ideal Body Weight:  61.8 kg  BMI:  Body mass index is 23.55 kg/m.  Estimated Nutritional Needs:   Kcal:  1940  Protein:  130-160 gm   Fluid:  1 L +UOP    Molli Barrows, RD, LDN, [place] Pager [phone] After Hours Pager [phone]

## 2019-03-17 NOTE — Progress Notes (Signed)
[facility] PCCM  Deep sedation administration   Indication: Permcath placement  The patient was taken to operating room 9 and identified as Jonathan [name] who is here today for a PermCath placement. A  timeout was performed.  The patient transported downstairs on mechanical ventilation and remained on a Servo ventilator for the duration of the case.  He was monitored for the duration of the procedure.  We administered Precedex at 0.8 mcg/min, 4 mg of Versed, 100 mcg of fentanyl.  Because of coughing when he was placed in the supine position we administered rocuronium so that he would not lose his tracheostomy during the procedure.  He tolerated the procedure well.  He was brought back up to the intensive care unit intubated and mechanically ventilated.  Roselie Awkward, MD [place] PCCM Pager: [phone] Cell: [phone] If no response, call [phone]

## 2019-03-17 NOTE — OR Nursing (Signed)
Intra-op chart verified by Karren Cobble, RN.

## 2019-03-17 NOTE — Progress Notes (Signed)
[facility] Progress Note    Assessment/ Plan:   Pt is a48 y.o.yo maleCOVID, cardiogenic shock, hypoxia, VDRF, AKI on CRRT.  1)Anuric AKI due to ATN, cardiogenic shock: On CRRT since 4/22  - Continue CRRT.  4K.  Keep even as tolerated and may not actually be able to pull fluid with increased pressor requirement.  RN is contacting team re: increasing pressor requirement as he is post-procedure  - No sign of renal recovery - RIJ Tunneled HD catheter placed 5/13  2)COVID-19.  Received plasma. Per PCCM.  3) ARDS, hypoxic respiratory failure on mechanical ventilation and s/p trach   4)Anemia in critical illness: Transfusion as needed.  5)Encephalopathy  Subjective:   Had a tunneled RIJ catheter placed today.  He was started on levophed this morning and requirement has been increasing.  He has been off of CRRT most of the day for the procedure and just started back. His left IJ vascath is still in place as nursing states this is his only other access currently; they are working to get alternate access.      Objective:   BP 93/64   Pulse (!) 113   Temp 99 F (37.2 C)   Resp (!) 22   Ht 5\' 5"  (1.651 m)   Wt 64.2 kg   SpO2 95%   BMI 23.55 kg/m   Intake/Output Summary (Last 24 hours) at 03/17/2019 1248 Last data filed at 03/17/2019 1200 Gross per 24 hour  Intake 2211.51 ml  Output 1648 ml  Net 563.51 ml   Weight change:   Physical Exam: General - adult male intubated  Card - S1S2 Pulm - occ rhonchi  abd - soft /nd  Ext - trace edema  LIJ vascath is in place RIJ tunneled catheter   Due to the nature of this patient's ONGEX-52 with isolation and in keeping with efforts to prevent the spread of infection and to conserve personal protective equipment, a physical exam was not personally performed.  Patient's symptoms and exam were discussed in detail with the RN.  A chart review of other providers notes and the patient's lab work as well as review  of other pertinent studies was performed.  Exam details from prior documentation were reviewed specifically and confirmed with the bedside nurse.  Location of service: [facility]    Imaging: Dg Chest 1 View  Result Date: 03/17/2019 CLINICAL DATA:  Central line placement. EXAM: CHEST  1 VIEW COMPARISON:  Radiograph of Mar 17, 2019. FINDINGS: The heart size and mediastinal contours are within normal limits. Tracheostomy and nasogastric tubes are unchanged in position. Stable left internal jugular catheter is noted with tip in expected position of SVC. Interval placement of right internal jugular dialysis catheter with distal tip in right atrium. No pneumothorax or pleural effusion is noted. Stable bilateral lung opacities are noted concerning for pneumonia. The visualized skeletal structures are unremarkable. IMPRESSION: Interval placement of right internal jugular dialysis catheter with tip in right atrium. Otherwise stable support apparatus. Stable bilateral lung opacities are noted consistent with pneumonia. Electronically Signed   By: Marijo Conception M.D.   On: 03/17/2019 11:48   Dg Chest Port 1 View  Result Date: 03/17/2019 CLINICAL DATA:  48 year old male with COVID-19. EXAM: PORTABLE CHEST 1 VIEW COMPARISON:  03/16/2019 and earlier. FINDINGS: Portable AP semi upright view at 0438 hours. Stable tracheostomy tube. Enteric tube courses to the abdomen and the side hole is at the gastric body level. Stable left IJ  approach dual lumen dialysis type catheter. Stable lung volumes and mediastinal contours. Coarse and patchy bilateral pulmonary opacity persists and is slightly greater in the left lung. Ventilation has mildly improved since 03/16/2019. No pneumothorax or pleural effusion. Negative visible bowel gas pattern. IMPRESSION: 1.  Stable lines and tubes. 2. Mildly improved bilateral ventilation since 03/16/2019. Bilateral COVID-19 pneumonia. Electronically Signed   By: Genevie Ann M.D.   On:  03/17/2019 06:09   Dg Chest Port 1 View  Result Date: 03/16/2019 CLINICAL DATA:  Tracheostomy tube placement.  COVID-19 pneumonia. EXAM: PORTABLE CHEST 1 VIEW COMPARISON:  03/16/2019 at 4:52 a.m. FINDINGS: A tracheostomy tube is been placed, tip 3.4 cm above the carina. Left IJ line tip projects over the SVC and azygos vein. Nasogastric tube enters the stomach. Mild improvement in the peripheral right airspace opacity compared to the earlier exam. Otherwise bilateral patchy airspace opacities persist. Heart size within normal limits. IMPRESSION: 1. Interval placement of tracheostomy tube without complicating feature. 2. Slight improvement in the peripheral airspace opacity in the right lung but otherwise stable bilateral airspace opacities compatible with ARDS or bilateral pneumonia. 3. Newly placed nasogastric tube extends into the stomach. Electronically Signed   By: Van Clines M.D.   On: 03/16/2019 13:48   Dg Chest Port 1 View  Result Date: 03/16/2019 CLINICAL DATA:  48 year old male COVID-19. In the ICU. EXAM: PORTABLE CHEST 1 VIEW COMPARISON:  03/14/2019 and earlier. FINDINGS: Portable AP semi upright view at 0450 hours. Extubated. Enteric tube removed. Left IJ approach dual lumen dialysis type catheter in stable position. Right IJ central line has been removed. Lower lung volumes with increased confluence of patchy and coarse bilateral pulmonary opacity. Increased perihilar air bronchograms. No pneumothorax. No pleural effusion is evident. Stable cardiac size and mediastinal contours. Negative visible bowel gas pattern. No acute osseous abnormality identified. IMPRESSION: 1. Extubated, enteric tube and right IJ central line removed. 2. Lower lung volumes with increased confluence of widespread pulmonary opacity since 03/14/2019. No pneumothorax or pleural effusion. Electronically Signed   By: Genevie Ann M.D.   On: 03/16/2019 05:21   Dg Fluoro Guide Cv Line-no Report  Result Date:  03/17/2019 Fluoroscopy was utilized by the requesting physician.  No radiographic interpretation.    Labs: BMET Recent Labs  Lab 03/14/19 0941  03/14/19 1206 03/14/19 1608 03/15/19 0300  03/15/19 1620 03/15/19 1630 03/16/19 0153 03/16/19 0450 03/16/19 1536 03/17/19 0230 03/17/19 0425  NA 136   < > 135 135 134*   < > 134* 135 134*  135 135 135 134* 134*  K 5.0   < > 5.4* 4.7 4.7   < > 5.0 5.0 4.8  4.7 4.7 4.6 4.5 4.5  CL 97*  --  102 101 99  --   --  99 100  101  --  101 100  --   CO2 22  --  21* 20* 24  --   --  23 22  22   --  23 24  --   GLUCOSE 157*  --  197* 164* 135*  --   --  59* 134*  135*  --  103* 204*  --   BUN 61*  --  63* 65* 55*  --   --  41* 37*  38*  --  36* 42*  --   CREATININE 3.51*  --  3.67* 3.43* 2.54*  --   --  2.36* 2.28*  2.24*  --  2.24* 2.18*  --   CALCIUM  10.5*  --  9.6 9.6 9.4  --   --  9.8 9.3  9.4  --  8.7* 8.6*  --   PHOS 3.2  --   --  3.3 3.6  --   --  3.1 2.7  2.7  --  4.1 2.3*  --    < > = values in this interval not displayed.   CBC Recent Labs  Lab 03/14/19 1206 03/15/19 0252  03/16/19 0153 03/16/19 0450 03/17/19 0230 03/17/19 0425  WBC 17.8* 17.2*  --  22.0*  --  14.7*  --   NEUTROABS 10.8*  --   --   --   --   --   --   HGB 8.0* 8.2*   < > 8.1* 8.5* 7.3* 7.8*  HCT 25.4* 26.4*   < > 26.1* 25.0* 25.0* 23.0*  MCV 98.8 98.5  --  96.7  --  100.4*  --   PLT 196 193  --  195  --  181  --    < > = values in this interval not displayed.    Medications:    . chlorhexidine  15 mL Mouth/Throat BID  . Chlorhexidine Gluconate Cloth  6 each Topical Daily  . docusate  100 mg Per Tube BID  . famotidine  20 mg Per Tube Daily  . feeding supplement (PRO-STAT SUGAR FREE 64)  60 mL Per Tube BID  . free water  100 mL Per Tube Q8H  . heparin  7,500 Units Subcutaneous Q8H  . hydrALAZINE  25 mg Oral Q8H  . insulin aspart  0-15 Units Subcutaneous Q4H  . insulin aspart  6 Units Subcutaneous Q4H  . insulin glargine  15 Units Subcutaneous BID   . mouth rinse  15 mL Mouth Rinse 10 times per day  . oxyCODONE  20 mg Oral Q6H  . propofol  500 mg Intravenous Once  . QUEtiapine  50 mg Oral BID  . sennosides  5 mL Oral BID  . sodium chloride flush  10-40 mL Intracatheter Q12H    Claudia Desanctis 03/17/2019, 12:48 PM

## 2019-03-17 NOTE — Progress Notes (Signed)
[facility]  Pre sedation evaluation  Procedure: vasc cath by Dr. Oneida Alar  Vitals:   03/17/19 0630 03/17/19 0645 03/17/19 0700 03/17/19 0750  BP:   102/86 102/86  Pulse: 89 (!) 102 (!) 101 (!) 104  Resp: 19 17 17  (!) 25  Temp: 98.6 F (37 C) 98.6 F (37 C) 98.6 F (37 C) 98.6 F (37 C)  TempSrc:      SpO2: 98% 96% 95% 99%  Weight:      Height:       Vent Mode: PRVC FiO2 (%):  [40 %-100 %] 40 % Set Rate:  [12 bmp-20 bmp] 20 bmp Vt Set:  [500 mL] 500 mL PEEP:  [5 cmH20-6 cmH20] 5 cmH20 Plateau Pressure:  [34 cmH20] 34 cmH20  Intake/Output Summary (Last 24 hours) at 03/17/2019 0820 Last data filed at 03/17/2019 0700 Gross per 24 hour  Intake 1828.98 ml  Output 1714 ml  Net 114.98 ml     General:  In bed on vent HENT: NCAT tracheostomy in place PULM: CTA B, vent supported breathing CV: RRR, no mgr GI: BS+, soft, nontender MSK: normal bulk and tone Neuro: sedated on vent  CBC    Component Value Date/Time   WBC 14.7 (H) 03/17/2019 0230   RBC 2.49 (L) 03/17/2019 0230   HGB 7.8 (L) 03/17/2019 0425   HCT 23.0 (L) 03/17/2019 0425   PLT 181 03/17/2019 0230   MCV 100.4 (H) 03/17/2019 0230   MCH 29.3 03/17/2019 0230   MCHC 29.2 (L) 03/17/2019 0230   RDW 18.6 (H) 03/17/2019 0230   LYMPHSABS 3.1 03/14/2019 1206   MONOABS 2.2 (H) 03/14/2019 1206   EOSABS 0.3 03/14/2019 1206   BASOSABS 0.2 (H) 03/14/2019 1206   BMET    Component Value Date/Time   NA 134 (L) 03/17/2019 0425   K 4.5 03/17/2019 0425   CL 100 03/17/2019 0230   CO2 24 03/17/2019 0230   GLUCOSE 204 (H) 03/17/2019 0230   BUN 42 (H) 03/17/2019 0230   CREATININE 2.18 (H) 03/17/2019 0230   CALCIUM 8.6 (L) 03/17/2019 0230   GFRNONAA 35 (L) 03/17/2019 0230   GFRAA 40 (L) 03/17/2019 0230   Impression: ESRD COVID 19 pneumonia Acute respiratory failure with hypoxemia Tracheostomy state  Plan Continue full vent support today VAP prevention WUA after OR Pressure support after OR Plan conscious sedation  on ventilator in OR , administered by Dr. Lake Bells Tube feedings to resume after procedure Full note to follow  Ccm time 30 minutes  Roselie Awkward, MD [facility] Pager: [phone] Cell: [phone] If no response, call [phone]

## 2019-03-17 NOTE — Progress Notes (Signed)
[facility]  For permcath today Stable on precedex Trach yesterday Stable on vent Will order versed, fentanyl pushes on call for consious sedation in [place], MD [place] [facility] Pager: [phone] Cell: [phone] If no response, call [phone]

## 2019-03-17 NOTE — Progress Notes (Signed)
Renal Navigator spoke with [facility] to inquire about the possibility of referring a patient from a hospital in [place] for OP HD treatment to a clinic in [place]. Per [place], [facility] is able to place patients internationally by calling [phone]. There is a [facility] in [place], [place], an hour from patient's home in [place], which based on Renal Navigator's earlier search, is the closest city to patient's home that provides OP HD. The address to the clinic in [place] is [address], [place] [place]. Phone number to clinic is [phone]. Renal Navigator continues to monitor patient's medical status and await further direction from medical team regarding patient's need for OP HD treatment before moving forward with referral for OP HD seat. Renal Navigator is unsure how long to expect an international referral process to take.  Alphonzo Cruise Renal Navigator [phone]

## 2019-03-17 NOTE — Progress Notes (Signed)
Assisted tele visit to patient with wife, mother, son and family member.  Maryelizabeth Rowan, RN

## 2019-03-17 NOTE — Op Note (Signed)
Procedure: Ultrasound-guided insertion of Palidrome catheter  Preoperative diagnosis: End-stage renal disease  Postoperative diagnosis: Same  Anesthesia: Local with IV sedation  Operative findings: 23 cm Palindrome catheter right internal jugular vein  Operative details: After obtaining informed consent, the patient was taken to the operating room. The patient was placed in supine position on the operating room table. After adequate sedation the patient's entire neck and chest were prepped and draped in usual sterile fashion. The patient was placed in Trendelenburg position. Ultrasound was used to identify the patient's right internal jugular vein. This had normal compressibility and respiratory variation. Local anesthesia was infiltrated over the right jugular vein.  Using ultrasound guidance, the right internal jugular vein was successfully cannulated.  A 0.035 J-tipped guidewire was threaded into the right internal jugular vein and into the superior vena cava followed by the inferior vena cava under fluoroscopic guidance.   Next sequential 12 and 14 dilators were placed over the guidewire into the right atrium.  A 16 French dilator with a peel-away sheath was then placed over the guidewire into the right atrium.   The guidewire and dilator were removed. A 23 cm Palindrome catheter was then placed through the peel away sheath into the right atrium.  The catheter was then tunneled subcutaneously, cut to length, and the hub attached. The catheter was noted to flush and draw easily. The catheter was inspected under fluoroscopy and found with its tip to be in the right atrium without any kinks throughout its course. The catheter was sutured to the skin with nylon sutures. The neck insertion site was closed with Vicryl stitch. The catheter was then loaded with concentrated Heparin solution. A dry sterile dressing was applied.  The patient tolerated procedure well and there were no complications. Instrument  sponge and needle counts correct in the case. The patient was taken to the recovery room in stable condition. Chest x-ray will be obtained in the recovery room.  Sheralee Qazi, MD [facility]  Office: [phone] Pager: [phone] 

## 2019-03-17 NOTE — Progress Notes (Signed)
Pt transported to OR 9 on ventilator. Pt stable throughout with no complications. Pt on full support throughout duration of procedure and returned to room 205-3 upon completion,

## 2019-03-17 NOTE — Consult Note (Signed)
[facility] Nurse wound consult note Patient receiving care in [place]. Reason for Consult: f/u assessment of bilateral facial PIs Wound type: Unstageable The staff caring for the patient have received and currently have on hand, a supply of hydrocolloid dressings.  The primary RN, Aaron Edelman removed the small piece of the hydrocolloid dressing to the right cheek during my remote visit by camera.  The area is dry and black.  There was a small amount of bleeding immediately after the dressing was removed, which is normal.  Plan of care continues to be daily application of a hydrocolloid dressing Kellie Simmering # [id number]).  I have modified my original order to indicate that half of a dressing should be placed to each cheek.  The larger size of the dressing will allow for containment of drainage that occurs during autolytic debridement, as the eschar/slough breaks down and the wound cleans up. Monitor the wound area(s) for worsening of condition such as: Signs/symptoms of infection,  Increase in size,  Development of or worsening of odor, Development of pain, or increased pain at the affected locations.  Notify the medical team if any of these develop.  Thank you for the consult.  Discussed plan of care with the bedside nurse.   Val Riles, RN, MSN, CWOCN, CNS-BC, pager [phone]

## 2019-03-17 NOTE — Progress Notes (Signed)
[facility] PROGRESS NOTE    Progress Note  Max Nuno  UDJ:[id number] DOB: January 03, 1971 DOA: 02/23/2019 PCP: Patient, No Pcp Per     Brief Narrative:   Jonathan [name] is an 48 y.o. male noninjected speaker arrived from [place] on 02/13/2019 lesion close quarters with 6 of the workers came in in respiratory distress intubated for ARDS due to COVID-19 infection started on CRRT, paralyzed and prone.  Medications: 4/22- Tocluzimab X 1 03/12/2019 on IV heparin drip. 03/07/2019, convalescent plasma 03/09/2019 paralytic stopped  Events: 02/23/2019 admitted to the ICU. 02/24/2019 started on CVVHD 4/21-4/24 Proned daily 4/28 -4/30 proned daily  03/04/2019 increase in oxygen demand require paralytic for 24 hours. 03/09/2019 hematochezia from gastric aspirate  Lines and tubes: 4/21 - ETT >>5.11.2020 4/21 - RIJ [facility] >> 4/22 L IJ HD Cath> 03/15/2019 HD catheter  Antibiotics: 02/23/2019-425 2020 azithromycin and Rocephin 02/23/2019-02/24/2019 03/06/2019 Rocephin 5/11 Vancomycin 5/12 Zosyn   Assessment/Plan:   Acute respiratory failure with hypoxia/COVID-19/  ARDS (adult respiratory distress syndrome) (HCC) Continue vent management per PCCM on ARDS HD protocol. Extubated on 03/15/2019 and was stable throughout the day.  Overnight became in respiratory distress had to be placed on BiPAP. Re-Intubated on 03/16/2019 and had tracheostomy placed Currently on intravenous antibiotics  ATN: Continue on CRRT per Nephrology Underwent tunneled HD catheter on 03/17/2019. He is currently on 100 cc of free water per tube.  Anemia due to chronic illness and possibly blood loss bleed: Hemoglobin ranging between 7-9. No signs of ongoing blood loss. Continue to follow hemoglobin closely  Elevated d-dimer: Likely related to inflammatory state, his d-dimer has been trending down. He was initially on IV heparin which was stopped after GI bleed. Continued protonix Prophylactic dose  of heparin, awaiting tunneled catheter.  Sinus tachycardia: tachycardia significantly improved, continue to use IV metoprolol PRN.  Pressure injury of skin on the nose:  Obese:   DVT prophylaxis: Heparin Family Communication: Wife Lorna Few international number [phone] Disposition Plan/Barrier to D/C: Keep in ICU. Code Status:     Code Status Orders  (From admission, onward)         Start     Ordered   03/02/19 1658  Limited resuscitation (code)  Continuous    Question Answer Comment  In the event of cardiac or respiratory ARREST: Initiate Code Blue, Call Rapid Response No   In the event of cardiac or respiratory ARREST: Perform CPR No   In the event of cardiac or respiratory ARREST: Perform Intubation/Mechanical Ventilation Yes   In the event of cardiac or respiratory ARREST: Use NIPPV/BiPAp only if indicated Yes   In the event of cardiac or respiratory ARREST: Administer ACLS medications if indicated Yes   In the event of cardiac or respiratory ARREST: Perform Defibrillation or Cardioversion if indicated Yes      03/02/19 1657        Code Status History    Date Active Date Inactive Code Status Order ID Comments User Context   02/23/2019 1051 03/02/2019 1657 Full Code [id number]  Kipp Brood, MD ED        IV Access:    Permacath right   Procedures and diagnostic studies:   Dg Chest 1 View  Result Date: 03/17/2019 CLINICAL DATA:  Central line placement. EXAM: CHEST  1 VIEW COMPARISON:  Radiograph of Mar 17, 2019. FINDINGS: The heart size and mediastinal contours are within normal limits. Tracheostomy and nasogastric tubes are unchanged in position. Stable left internal jugular catheter is noted  with tip in expected position of SVC. Interval placement of right internal jugular dialysis catheter with distal tip in right atrium. No pneumothorax or pleural effusion is noted. Stable bilateral lung opacities are noted concerning for pneumonia. The visualized  skeletal structures are unremarkable. IMPRESSION: Interval placement of right internal jugular dialysis catheter with tip in right atrium. Otherwise stable support apparatus. Stable bilateral lung opacities are noted consistent with pneumonia. Electronically Signed   By: Marijo Conception M.D.   On: 03/17/2019 11:48   Dg Chest Port 1 View  Result Date: 03/17/2019 CLINICAL DATA:  Hypotension EXAM: PORTABLE CHEST 1 VIEW COMPARISON:  03/17/2019 FINDINGS: The left-sided central venous catheter tip projects over the left brachiocephalic vein. The right-sided tunneled dialysis catheter tip projects over the right atrium. The enteric tube extends below the level of the left hemidiaphragm. The tracheostomy tube terminates above the carina. Diffuse bilateral hazy airspace opacities are again noted. These have improved somewhat since the prior study. There is no pneumothorax. No large pleural effusion. The cardiac silhouette is stable from prior studies. IMPRESSION: 1. Lines and tubes as above. 2. Diffuse hazy bilateral airspace opacities with some slight interval improvement when compared to prior study. Electronically Signed   By: Constance Holster M.D.   On: 03/17/2019 14:07   Dg Chest Port 1 View  Result Date: 03/17/2019 CLINICAL DATA:  48 year old male with COVID-19. EXAM: PORTABLE CHEST 1 VIEW COMPARISON:  03/16/2019 and earlier. FINDINGS: Portable AP semi upright view at 0438 hours. Stable tracheostomy tube. Enteric tube courses to the abdomen and the side hole is at the gastric body level. Stable left IJ approach dual lumen dialysis type catheter. Stable lung volumes and mediastinal contours. Coarse and patchy bilateral pulmonary opacity persists and is slightly greater in the left lung. Ventilation has mildly improved since 03/16/2019. No pneumothorax or pleural effusion. Negative visible bowel gas pattern. IMPRESSION: 1.  Stable lines and tubes. 2. Mildly improved bilateral ventilation since 03/16/2019.  Bilateral COVID-19 pneumonia. Electronically Signed   By: Genevie Ann M.D.   On: 03/17/2019 06:09   Dg Chest Port 1 View  Result Date: 03/16/2019 CLINICAL DATA:  Tracheostomy tube placement.  COVID-19 pneumonia. EXAM: PORTABLE CHEST 1 VIEW COMPARISON:  03/16/2019 at 4:52 a.m. FINDINGS: A tracheostomy tube is been placed, tip 3.4 cm above the carina. Left IJ line tip projects over the SVC and azygos vein. Nasogastric tube enters the stomach. Mild improvement in the peripheral right airspace opacity compared to the earlier exam. Otherwise bilateral patchy airspace opacities persist. Heart size within normal limits. IMPRESSION: 1. Interval placement of tracheostomy tube without complicating feature. 2. Slight improvement in the peripheral airspace opacity in the right lung but otherwise stable bilateral airspace opacities compatible with ARDS or bilateral pneumonia. 3. Newly placed nasogastric tube extends into the stomach. Electronically Signed   By: Van Clines M.D.   On: 03/16/2019 13:48   Dg Chest Port 1 View  Result Date: 03/16/2019 CLINICAL DATA:  48 year old male COVID-19. In the ICU. EXAM: PORTABLE CHEST 1 VIEW COMPARISON:  03/14/2019 and earlier. FINDINGS: Portable AP semi upright view at 0450 hours. Extubated. Enteric tube removed. Left IJ approach dual lumen dialysis type catheter in stable position. Right IJ central line has been removed. Lower lung volumes with increased confluence of patchy and coarse bilateral pulmonary opacity. Increased perihilar air bronchograms. No pneumothorax. No pleural effusion is evident. Stable cardiac size and mediastinal contours. Negative visible bowel gas pattern. No acute osseous abnormality identified. IMPRESSION: 1. Extubated, enteric  tube and right IJ central line removed. 2. Lower lung volumes with increased confluence of widespread pulmonary opacity since 03/14/2019. No pneumothorax or pleural effusion. Electronically Signed   By: Genevie Ann M.D.   On:  03/16/2019 05:21   Dg Fluoro Guide Cv Line-no Report  Result Date: 03/17/2019 Fluoroscopy was utilized by the requesting physician.  No radiographic interpretation.     Medical Consultants:   PCCM Nephrology  Anti-Infectives:   Azithromycin 4/21 >>4/25 Ceftriaxone 4/22 >>4/25 Hydroxychloroquine 4/21 > 4/22 Vancomycin 03/14/2019> Zosyn 03/16/2019>  Subjective:    Felicia Both is seen in room after procedure and is sedated  Objective:    Vitals:   03/17/19 1715 03/17/19 1730 03/17/19 1745 03/17/19 1800  BP: 118/68 (!) 101/52 107/64 (!) 103/59  Pulse: (!) 106 (!) 104 (!) 106 99  Resp: (!) 25 20 (!) 22 20  Temp: (!) 97.5 F (36.4 C) 97.7 F (36.5 C) 97.7 F (36.5 C) 97.9 F (36.6 C)  TempSrc:      SpO2: 100% 97% 97% 97%  Weight:      Height:        Intake/Output Summary (Last 24 hours) at 03/17/2019 1823 Last data filed at 03/17/2019 1821 Gross per 24 hour  Intake 3044.38 ml  Output 1655 ml  Net 1389.38 ml   Filed Weights   03/14/19 0354 03/15/19 0500 03/16/19 0500  Weight: 64.7 kg 64.6 kg 64.2 kg    Exam: General exam: sedated, no distress Respiratory system: bilateral rhonchi. No wheezing. Cardiovascular system:RRR. No murmurs, rubs, gallops. Gastrointestinal system: Abdomen is nondistended, soft and nontender. No organomegaly or masses felt. Normal bowel sounds heard. Central nervous system:No focal neurological deficits. Extremities: No C/C/E, +pedal pulses Skin: No rashes, lesions or ulcers Psychiatry: unable to assess due to sedation     Data Reviewed:    Labs: Basic Metabolic Panel: Recent Labs  Lab 03/13/19 0200 03/14/19 0941  03/15/19 0252  03/15/19 1630 03/16/19 0153 03/16/19 0450 03/16/19 1536 03/17/19 0230 03/17/19 0425 03/17/19 1700  NA 134* 136   < >  --    < > 135 134*  135 135 135 134* 134* 135  K 4.8 5.0   < >  --    < > 5.0 4.8  4.7 4.7 4.6 4.5 4.5 4.2  CL 100 97*   < >  --    < > 99 100  101  --  101 100  --   102  CO2 22 22   < >  --    < > 23 22  22   --  23 24  --  24  GLUCOSE 187* 157*   < >  --    < > 59* 134*  135*  --  103* 204*  --  194*  BUN 42* 61*   < >  --    < > 41* 37*  38*  --  36* 42*  --  45*  CREATININE 2.12* 3.51*   < >  --    < > 2.36* 2.28*  2.24*  --  2.24* 2.18*  --  2.30*  CALCIUM 9.1 10.5*   < >  --    < > 9.8 9.3  9.4  --  8.7* 8.6*  --  8.1*  MG 3.0* 3.4*  --  3.1*  --   --  3.1*  --   --  3.0*  --   --   PHOS 2.8 3.2   < >  --    < >  3.1 2.7  2.7  --  4.1 2.3*  --  4.2   < > = values in this interval not displayed.   GFR Estimated Creatinine Clearance: 34.2 mL/min (A) (by C-G formula based on SCr of 2.3 mg/dL (H)). Liver Function Tests: Recent Labs  Lab 03/14/19 1206  03/15/19 1630 03/16/19 0153 03/16/19 1536 03/17/19 0230 03/17/19 1700  AST 29  --   --   --   --   --   --   ALT 30  --   --   --   --   --   --   ALKPHOS 144*  --   --   --   --   --   --   BILITOT 0.7  --   --   --   --   --   --   PROT 7.8  --   --   --   --   --   --   ALBUMIN 3.1*   < > 3.5 3.3* 3.0* 2.7* 2.5*   < > = values in this interval not displayed.   No results for input(s): LIPASE, AMYLASE in the last 168 hours. No results for input(s): AMMONIA in the last 168 hours. Coagulation profile Recent Labs  Lab 03/14/19 1206 03/16/19 0845  INR 1.2 1.2   COVID-19 Labs  No results for input(s): DDIMER, FERRITIN, LDH, CRP in the last 72 hours.  Lab Results  Component Value Date   SARSCOV2NAA POSITIVE (A) 02/23/2019    CBC: Recent Labs  Lab 03/14/19 1100 03/14/19 1206 03/15/19 0252  03/15/19 1620 03/16/19 0153 03/16/19 0450 03/17/19 0230 03/17/19 0425  WBC 19.0* 17.8* 17.2*  --   --  22.0*  --  14.7*  --   NEUTROABS  --  10.8*  --   --   --   --   --   --   --   HGB 9.1* 8.0* 8.2*   < > 9.2* 8.1* 8.5* 7.3* 7.8*  HCT 29.7* 25.4* 26.4*   < > 27.0* 26.1* 25.0* 25.0* 23.0*  MCV 97.4 98.8 98.5  --   --  96.7  --  100.4*  --   PLT 215 196 193  --   --  195  --  181   --    < > = values in this interval not displayed.   Cardiac Enzymes: No results for input(s): CKTOTAL, CKMB, CKMBINDEX, TROPONINI in the last 168 hours. BNP (last 3 results) No results for input(s): PROBNP in the last 8760 hours. CBG: Recent Labs  Lab 03/16/19 2325 03/17/19 0349 03/17/19 0821 03/17/19 1200 03/17/19 1655  GLUCAP 176* 162* 138* 178* 173*   D-Dimer: No results for input(s): DDIMER in the last 72 hours. Hgb A1c: No results for input(s): HGBA1C in the last 72 hours. Lipid Profile: No results for input(s): CHOL, HDL, LDLCALC, TRIG, CHOLHDL, LDLDIRECT in the last 72 hours. Thyroid function studies: No results for input(s): TSH, T4TOTAL, T3FREE, THYROIDAB in the last 72 hours.  Invalid input(s): FREET3 Anemia work up: No results for input(s): VITAMINB12, FOLATE, FERRITIN, TIBC, IRON, RETICCTPCT in the last 72 hours. Sepsis Labs: Recent Labs  Lab 03/14/19 1206 03/14/19 1207 03/14/19 1643 03/15/19 0252 03/16/19 0153 03/17/19 0230  PROCALCITON 3.52  --   --   --   --   --   WBC 17.8*  --   --  17.2* 22.0* 14.7*  LATICACIDVEN  --  1.2 1.2  --   --   --  Microbiology Recent Results (from the past 240 hour(s))  Culture, blood (Routine X 2) w Reflex to ID Panel     Status: None (Preliminary result)   Collection Time: 03/14/19 11:45 AM  Result Value Ref Range Status   Specimen Description   Final    BLOOD RIGHT ANTECUBITAL Performed at [facility] [address], [place], [place] [address]    Special Requests   Final    BOTTLES DRAWN AEROBIC ONLY Blood Culture adequate volume Performed at [facility] [address] [place], [place] [address]    Culture   Final    NO GROWTH 3 DAYS Performed at [facility], [place] [address] [place], [place] [address]    Report Status PENDING  Incomplete  Culture, blood (Routine X 2) w Reflex to ID Panel     Status: None (Preliminary result)   Collection Time: 03/14/19  11:50 AM  Result Value Ref Range Status   Specimen Description   Final    BLOOD RIGHT HAND Performed at [facility] [address], [place], [place] [address]    Special Requests   Final    BOTTLES DRAWN AEROBIC ONLY Blood Culture adequate volume Performed at [facility] [address] [place], [place] [address]    Culture   Final    NO GROWTH 3 DAYS Performed at [facility], [place] [address] [place],  [address]    Report Status PENDING  Incomplete     Medications:   . chlorhexidine  15 mL Mouth/Throat BID  . Chlorhexidine Gluconate Cloth  6 each Topical Daily  . docusate  100 mg Per Tube BID  . famotidine  20 mg Per Tube Daily  . feeding supplement (PRO-STAT SUGAR FREE 64)  60 mL Per Tube BID  . free water  100 mL Per Tube Q8H  . heparin  7,500 Units Subcutaneous Q8H  . insulin aspart  0-15 Units Subcutaneous Q4H  . insulin aspart  6 Units Subcutaneous Q4H  . insulin glargine  15 Units Subcutaneous BID  . mouth rinse  15 mL Mouth Rinse 10 times per day  . oxyCODONE  20 mg Oral Q6H  . propofol  500 mg Intravenous Once  . QUEtiapine  75 mg Per Tube BID  . sennosides  5 mL Oral BID  . sodium chloride flush  10-40 mL Intracatheter Q12H   Continuous Infusions: .  prismasol BGK 4/2.5 300 mL/hr at 03/17/19 1801  .  prismasol BGK 4/2.5 500 mL/hr at 03/17/19 0514  . sodium chloride    . sodium chloride 250 mL (03/17/19 1638)  . dexmedetomidine (PRECEDEX) IV infusion 0.1 mcg/kg/hr (03/17/19 1800)  . feeding supplement (VITAL 1.5 CAL) 1,000 mL (03/17/19 1320)  . fluconazole (DIFLUCAN) IV Stopped (03/17/19 1320)  . heparin 10,000 units/ 20 mL infusion syringe 1,000 Units/hr (03/17/19 1800)  . HYDROmorphone Stopped (03/16/19 2302)  . norepinephrine (LEVOPHED) Adult infusion 1 mcg/min (03/17/19 1800)  . piperacillin-tazobactam Stopped (03/17/19 1510)  . prismasol BGK 4/2.5 1,500 mL/hr at 03/17/19 1820  . sodium phosphate   Dextrose 5% IVPB 43 mL/hr at 03/17/19 1800  . vancomycin Stopped (03/17/19 1433)     LOS: 22 days   [employer]  [facility]  03/17/2019, 6:23 PM

## 2019-03-18 ENCOUNTER — Inpatient Hospital Stay (HOSPITAL_COMMUNITY): Payer: HRSA Program

## 2019-03-18 ENCOUNTER — Encounter (HOSPITAL_COMMUNITY): Payer: Self-pay

## 2019-03-18 ENCOUNTER — Inpatient Hospital Stay (HOSPITAL_COMMUNITY): Payer: Self-pay

## 2019-03-18 DIAGNOSIS — I272 Pulmonary hypertension, unspecified: Secondary | ICD-10-CM

## 2019-03-18 DIAGNOSIS — I959 Hypotension, unspecified: Secondary | ICD-10-CM

## 2019-03-18 LAB — POCT I-STAT 7, (LYTES, BLD GAS, ICA,H+H)
Acid-Base Excess: 2 mmol/L (ref 0.0–2.0)
Acid-Base Excess: 1 mmol/L (ref 0.0–2.0)
Bicarbonate: 26.5 mmol/L (ref 20.0–28.0)
Bicarbonate: 25.2 mmol/L (ref 20.0–28.0)
Bicarbonate: 25.1 mmol/L (ref 20.0–28.0)
Calcium, Ion: 1.21 mmol/L (ref 1.15–1.40)
Calcium, Ion: 1.21 mmol/L (ref 1.15–1.40)
Calcium, Ion: 1.23 mmol/L (ref 1.15–1.40)
HCT: 22 % — ABNORMAL LOW (ref 39.0–52.0)
HCT: 23 % — ABNORMAL LOW (ref 39.0–52.0)
HCT: 28 % — ABNORMAL LOW (ref 39.0–52.0)
Hemoglobin: 7.5 g/dL — ABNORMAL LOW (ref 13.0–17.0)
Hemoglobin: 7.8 g/dL — ABNORMAL LOW (ref 13.0–17.0)
Hemoglobin: 9.5 g/dL — ABNORMAL LOW (ref 13.0–17.0)
O2 Saturation: 83 %
O2 Saturation: 83 %
O2 Saturation: 85 %
Patient temperature: 36.3
Patient temperature: 36.6
Patient temperature: 36.7
Potassium: 4.4 mmol/L (ref 3.5–5.1)
Potassium: 4.8 mmol/L (ref 3.5–5.1)
Potassium: 4.9 mmol/L (ref 3.5–5.1)
Sodium: 134 mmol/L — ABNORMAL LOW (ref 135–145)
Sodium: 135 mmol/L (ref 135–145)
Sodium: 134 mmol/L — ABNORMAL LOW (ref 135–145)
TCO2: 28 mmol/L (ref 22–32)
TCO2: 26 mmol/L (ref 22–32)
TCO2: 26 mmol/L (ref 22–32)
pCO2 arterial: 41.6 mmHg (ref 32.0–48.0)
pCO2 arterial: 38.1 mmHg (ref 32.0–48.0)
pCO2 arterial: 42.1 mmHg (ref 32.0–48.0)
pH, Arterial: 7.409 (ref 7.350–7.450)
pH, Arterial: 7.427 (ref 7.350–7.450)
pH, Arterial: 7.381 (ref 7.350–7.450)
pO2, Arterial: 46 mmHg — ABNORMAL LOW (ref 83.0–108.0)
pO2, Arterial: 46 mmHg — ABNORMAL LOW (ref 83.0–108.0)
pO2, Arterial: 50 mmHg — ABNORMAL LOW (ref 83.0–108.0)

## 2019-03-18 LAB — RENAL FUNCTION PANEL
Albumin: 2.4 g/dL — ABNORMAL LOW (ref 3.5–5.0)
Albumin: 2.6 g/dL — ABNORMAL LOW (ref 3.5–5.0)
Anion gap: 8 (ref 5–15)
Anion gap: 12 (ref 5–15)
BUN: 38 mg/dL — ABNORMAL HIGH (ref 6–20)
BUN: 31 mg/dL — ABNORMAL HIGH (ref 6–20)
CO2: 27 mmol/L (ref 22–32)
CO2: 24 mmol/L (ref 22–32)
Calcium: 8.5 mg/dL — ABNORMAL LOW (ref 8.9–10.3)
Calcium: 8.7 mg/dL — ABNORMAL LOW (ref 8.9–10.3)
Chloride: 102 mmol/L (ref 98–111)
Chloride: 99 mmol/L (ref 98–111)
Creatinine, Ser: 1.98 mg/dL — ABNORMAL HIGH (ref 0.61–1.24)
Creatinine, Ser: 1.77 mg/dL — ABNORMAL HIGH (ref 0.61–1.24)
GFR calc Af Amer: 45 mL/min — ABNORMAL LOW (ref >60)
GFR calc Af Amer: 51 mL/min — ABNORMAL LOW (ref >60)
GFR calc non Af Amer: 39 mL/min — ABNORMAL LOW (ref >60)
GFR calc non Af Amer: 44 mL/min — ABNORMAL LOW (ref >60)
Glucose, Bld: 91 mg/dL (ref 70–99)
Glucose, Bld: 141 mg/dL — ABNORMAL HIGH (ref 70–99)
Phosphorus: 3.1 mg/dL (ref 2.5–4.6)
Phosphorus: 2.7 mg/dL (ref 2.5–4.6)
Potassium: 4 mmol/L (ref 3.5–5.1)
Potassium: 4.6 mmol/L (ref 3.5–5.1)
Sodium: 137 mmol/L (ref 135–145)
Sodium: 135 mmol/L (ref 135–145)

## 2019-03-18 LAB — ECHOCARDIOGRAM LIMITED
Height: 65 in
Weight: 2317.48 oz

## 2019-03-18 LAB — CBC
HCT: 24.9 % — ABNORMAL LOW (ref 39.0–52.0)
Hemoglobin: 7.3 g/dL — ABNORMAL LOW (ref 13.0–17.0)
MCH: 29.8 pg (ref 26.0–34.0)
MCHC: 29.3 g/dL — ABNORMAL LOW (ref 30.0–36.0)
MCV: 101.6 fL — ABNORMAL HIGH (ref 80.0–100.0)
Platelets: 194 10*3/uL (ref 150–400)
RBC: 2.45 MIL/uL — ABNORMAL LOW (ref 4.22–5.81)
RDW: 18.6 % — ABNORMAL HIGH (ref 11.5–15.5)
WBC: 12.8 10*3/uL — ABNORMAL HIGH (ref 4.0–10.5)
nRBC: 0.6 % — ABNORMAL HIGH (ref 0.0–0.2)

## 2019-03-18 LAB — MAGNESIUM: Magnesium: 2.9 mg/dL — ABNORMAL HIGH (ref 1.7–2.4)

## 2019-03-18 LAB — GLUCOSE, CAPILLARY
Glucose-Capillary: 123 mg/dL — ABNORMAL HIGH (ref 70–99)
Glucose-Capillary: 105 mg/dL — ABNORMAL HIGH (ref 70–99)
Glucose-Capillary: 192 mg/dL — ABNORMAL HIGH (ref 70–99)
Glucose-Capillary: 131 mg/dL — ABNORMAL HIGH (ref 70–99)
Glucose-Capillary: 118 mg/dL — ABNORMAL HIGH (ref 70–99)
Glucose-Capillary: 170 mg/dL — ABNORMAL HIGH (ref 70–99)

## 2019-03-18 LAB — VANCOMYCIN, TROUGH: Vancomycin Tr: 13 ug/mL — ABNORMAL LOW (ref 15–20)

## 2019-03-18 LAB — HEPARIN LEVEL (UNFRACTIONATED): Heparin Unfractionated: 0.95 IU/mL — ABNORMAL HIGH (ref 0.30–0.70)

## 2019-03-18 LAB — APTT: aPTT: 172 seconds (ref 24–36)

## 2019-03-18 MED ORDER — VANCOMYCIN HCL IN DEXTROSE 1-5 GM/200ML-% IV SOLN
1000.0000 mg | INTRAVENOUS | Status: DC
  Administered 2019-03-19 – 2019-03-20 (×2): 1000 mg via INTRAVENOUS
  Filled 2019-03-18 (×3): qty 200

## 2019-03-18 MED ORDER — HEPARIN (PORCINE) 25000 UT/250ML-% IV SOLN
800.0000 [IU]/h | INTRAVENOUS | Status: DC
  Administered 2019-03-18: 13:00:00 800 [IU]/h via INTRAVENOUS
  Filled 2019-03-18: qty 250

## 2019-03-18 MED ORDER — OXYCODONE HCL 5 MG PO TABS
10.0000 mg | ORAL_TABLET | Freq: Four times a day (QID) | ORAL | Status: DC
  Administered 2019-03-18 – 2019-03-20 (×9): 10 mg via ORAL
  Filled 2019-03-18 (×8): qty 2

## 2019-03-18 MED ORDER — CHLORHEXIDINE GLUCONATE 0.12 % MT SOLN
15.0000 mL | Freq: Two times a day (BID) | OROMUCOSAL | Status: DC
  Administered 2019-03-18 – 2019-03-27 (×18): 15 mL via OROMUCOSAL
  Filled 2019-03-18 (×8): qty 15

## 2019-03-18 MED ORDER — HEPARIN (PORCINE) 25000 UT/250ML-% IV SOLN
700.0000 [IU]/h | INTRAVENOUS | Status: DC
  Administered 2019-03-19: 22:00:00 700 [IU]/h via INTRAVENOUS
  Filled 2019-03-18: qty 250

## 2019-03-18 NOTE — Progress Notes (Signed)
ANTICOAGULATION CONSULT NOTE  Pharmacy Consult for IV UFH Indication: Suspected PE  Not on File  Patient Measurements: Height: 5\' 5"  (165.1 cm) Weight: 144 lb 13.5 oz (65.7 kg) IBW/kg (Calculated) : 61.5 Heparin Dosing Weight: 66 kg  Vital Signs: Temp: 99.1 F (37.3 C) (05/14 2330) BP: 92/64 (05/14 2330) Pulse Rate: 97 (05/14 2330)  Labs: Recent Labs    03/16/19 0153  03/16/19 0845  03/17/19 0230  03/17/19 1700 03/18/19 0220 03/18/19 0850 03/18/19 1029 03/18/19 1112 03/18/19 1600 03/18/19 2030  HGB 8.1*   < >  --   --  7.3*   < >  --  7.3* 7.5* 7.8* 9.5*  --   --   HCT 26.1*   < >  --   --  25.0*   < >  --  24.9* 22.0* 23.0* 28.0*  --   --   PLT 195  --   --   --  181  --   --  194  --   --   --   --   --   APTT 192*  --   --   --  128*  --   --  172*  --   --   --   --   --   LABPROT  --   --  15.1  --   --   --   --   --   --   --   --   --   --   INR  --   --  1.2  --   --   --   --   --   --   --   --   --   --   HEPARINUNFRC  --   --   --   --   --   --   --   --   --   --   --   --  0.95*  CREATININE 2.28*  2.24*  --   --    < > 2.18*  --  2.30* 1.98*  --   --   --  1.77*  --    < > = values in this interval not displayed.    Estimated Creatinine Clearance: 44.4 mL/min (A) (by C-G formula based on SCr of 1.77 mg/dL (H)).  Assessment: 47 yom with complications from [id number] including acute respiratory failure s/p tracheostomy and ATN on CRRT now s/p permcath. CCM transitioning to heparin drip 2/2 suspected PE. Patient is on SQH 7500 units q 8 h. Also ordered heparin infusion for CRRT circuit. APTT this AM= 172, last dose of SQH at 0500. No bleeding reported.   AM Update:  - HL is 0.95, supratherapeutic - No line or bleeding issues per RN staff   Goal of Therapy:  Heparin level 0.3-0.7 units/ml Monitor platelets by anticoagulation protocol: Yes   Plan:  - Decrease heparin infusion to 700 units/hr  - Will check heparin level in 8 hours.  - Daily  CBC/HL - Monitor for bleeding     Royetta Asal, PharmD, BCPS 03/19/2019 1:10 AM

## 2019-03-18 NOTE — Progress Notes (Signed)
Subjective:  No UOP - norepi being weaned - no machine issues reported  Objective Vital signs in last 24 hours: Vitals:   03/18/19 1000 03/18/19 1036 03/18/19 1100 03/18/19 1140  BP: (!) 96/57  118/85 118/85  Pulse: 94  (!) 101 96  Resp: (!) 26  (!) 35 (!) 27  Temp: 97.7 F (36.5 C)  97.9 F (36.6 C) 98.2 F (36.8 C)  TempSrc:      SpO2: 99% 97% 100% 98%  Weight:      Height:       Weight change:   Intake/Output Summary (Last 24 hours) at 03/18/2019 1259 Last data filed at 03/18/2019 1200 Gross per 24 hour  Intake 3533.62 ml  Output 2409 ml  Net 1124.62 ml    Assessment/ Plan: Pt is a 48 y.o. yo male who was admitted on 02/23/2019 with anuric AKI due to ATN in the setting of COVID  Assessment/Plan: 1. AKI-  On CRRT since 4/21- had been hemodynamically unstable but now maybe better- pressors weaned.  NO UOP so still requiring RRT.  Need to do CRRT while here.  Now have a tunneled HD cath 2. HTN/vol-  Seems like running positive on CRRT but possibly appropriate since able to wean pressors - however, O2 req is getting worse as well  3. Anemia- seeming to have big jump in hgb from yest to today - not sure was real- supportive care for now- no iron in the setting of sepsis - transfuse as needed  4. ID-  covid positive - also vanc and zosyn  5. VDRF-  O2 req is increasing  6.  Elytes- K is stable all 4 k bath, got phos yesterday- 3.1 today - calcium OK    Jonathan [name]    Labs: Basic Metabolic Panel: Recent Labs  Lab 03/17/19 0230  03/17/19 1700 03/18/19 0220 03/18/19 0850 03/18/19 1029 03/18/19 1112  NA 134*   < > 135 137 134* 135 134*  K 4.5   < > 4.2 4.0 4.4 4.8 4.9  CL 100  --  102 102  --   --   --   CO2 24  --  24 27  --   --   --   GLUCOSE 204*  --  194* 91  --   --   --   BUN 42*  --  45* 38*  --   --   --   CREATININE 2.18*  --  2.30* 1.98*  --   --   --   CALCIUM 8.6*  --  8.1* 8.5*  --   --   --   PHOS 2.3*  --  4.2 3.1  --   --   --    < > =  values in this interval not displayed.   Liver Function Tests: Recent Labs  Lab 03/14/19 1206  03/17/19 0230 03/17/19 1700 03/18/19 0220  AST 29  --   --   --   --   ALT 30  --   --   --   --   ALKPHOS 144*  --   --   --   --   BILITOT 0.7  --   --   --   --   PROT 7.8  --   --   --   --   ALBUMIN 3.1*   < > 2.7* 2.5* 2.4*   < > = values in this interval not displayed.   No results for  input(s): LIPASE, AMYLASE in the last 168 hours. No results for input(s): AMMONIA in the last 168 hours. CBC: Recent Labs  Lab 03/14/19 1206 03/15/19 0252  03/16/19 0153  03/17/19 0230  03/18/19 0220 03/18/19 0850 03/18/19 1029 03/18/19 1112  WBC 17.8* 17.2*  --  22.0*  --  14.7*  --  12.8*  --   --   --   NEUTROABS 10.8*  --   --   --   --   --   --   --   --   --   --   HGB 8.0* 8.2*   < > 8.1*   < > 7.3*   < > 7.3* 7.5* 7.8* 9.5*  HCT 25.4* 26.4*   < > 26.1*   < > 25.0*   < > 24.9* 22.0* 23.0* 28.0*  MCV 98.8 98.5  --  96.7  --  100.4*  --  101.6*  --   --   --   PLT 196 193  --  195  --  181  --  194  --   --   --    < > = values in this interval not displayed.   Cardiac Enzymes: No results for input(s): CKTOTAL, CKMB, CKMBINDEX, TROPONINI in the last 168 hours. CBG: Recent Labs  Lab 03/17/19 1942 03/17/19 2327 03/18/19 0326 03/18/19 0757 03/18/19 1155  GLUCAP 121* 145* 123* 105* 192*    Iron Studies: No results for input(s): IRON, TIBC, TRANSFERRIN, FERRITIN in the last 72 hours. Studies/Results: Dg Chest 1 View  Result Date: 03/17/2019 CLINICAL DATA:  Central line placement. EXAM: CHEST  1 VIEW COMPARISON:  Radiograph of Mar 17, 2019. FINDINGS: The heart size and mediastinal contours are within normal limits. Tracheostomy and nasogastric tubes are unchanged in position. Stable left internal jugular catheter is noted with tip in expected position of SVC. Interval placement of right internal jugular dialysis catheter with distal tip in right atrium. No pneumothorax or pleural  effusion is noted. Stable bilateral lung opacities are noted concerning for pneumonia. The visualized skeletal structures are unremarkable. IMPRESSION: Interval placement of right internal jugular dialysis catheter with tip in right atrium. Otherwise stable support apparatus. Stable bilateral lung opacities are noted consistent with pneumonia. Electronically Signed   By: Marijo Conception M.D.   On: 03/17/2019 11:48   Dg Chest Port 1 View  Result Date: 03/18/2019 CLINICAL DATA:  Acute respiratory failure, known positive COVID-19 EXAM: PORTABLE CHEST 1 VIEW COMPARISON:  03/17/2019 FINDINGS: Cardiac shadow is stable. Tracheostomy tube and bilateral jugular catheters are again noted and stable. Gastric catheter extends into the stomach. Patchy infiltrates are noted throughout both lungs stable from the previous day. No acute bony abnormality is noted. IMPRESSION: Stable bilateral opacities Tubes and lines stable from the previous day. Electronically Signed   By: Inez Catalina M.D.   On: 03/18/2019 12:12   Dg Chest Port 1 View  Result Date: 03/17/2019 CLINICAL DATA:  Hypotension EXAM: PORTABLE CHEST 1 VIEW COMPARISON:  03/17/2019 FINDINGS: The left-sided central venous catheter tip projects over the left brachiocephalic vein. The right-sided tunneled dialysis catheter tip projects over the right atrium. The enteric tube extends below the level of the left hemidiaphragm. The tracheostomy tube terminates above the carina. Diffuse bilateral hazy airspace opacities are again noted. These have improved somewhat since the prior study. There is no pneumothorax. No large pleural effusion. The cardiac silhouette is stable from prior studies. IMPRESSION: 1. Lines and tubes as above. 2.  Diffuse hazy bilateral airspace opacities with some slight interval improvement when compared to prior study. Electronically Signed   By: Constance Holster M.D.   On: 03/17/2019 14:07   Dg Chest Port 1 View  Result Date:  03/17/2019 CLINICAL DATA:  48 year old male with COVID-19. EXAM: PORTABLE CHEST 1 VIEW COMPARISON:  03/16/2019 and earlier. FINDINGS: Portable AP semi upright view at 0438 hours. Stable tracheostomy tube. Enteric tube courses to the abdomen and the side hole is at the gastric body level. Stable left IJ approach dual lumen dialysis type catheter. Stable lung volumes and mediastinal contours. Coarse and patchy bilateral pulmonary opacity persists and is slightly greater in the left lung. Ventilation has mildly improved since 03/16/2019. No pneumothorax or pleural effusion. Negative visible bowel gas pattern. IMPRESSION: 1.  Stable lines and tubes. 2. Mildly improved bilateral ventilation since 03/16/2019. Bilateral COVID-19 pneumonia. Electronically Signed   By: Genevie Ann M.D.   On: 03/17/2019 06:09   Dg Chest Port 1 View  Result Date: 03/16/2019 CLINICAL DATA:  Tracheostomy tube placement.  COVID-19 pneumonia. EXAM: PORTABLE CHEST 1 VIEW COMPARISON:  03/16/2019 at 4:52 a.m. FINDINGS: A tracheostomy tube is been placed, tip 3.4 cm above the carina. Left IJ line tip projects over the SVC and azygos vein. Nasogastric tube enters the stomach. Mild improvement in the peripheral right airspace opacity compared to the earlier exam. Otherwise bilateral patchy airspace opacities persist. Heart size within normal limits. IMPRESSION: 1. Interval placement of tracheostomy tube without complicating feature. 2. Slight improvement in the peripheral airspace opacity in the right lung but otherwise stable bilateral airspace opacities compatible with ARDS or bilateral pneumonia. 3. Newly placed nasogastric tube extends into the stomach. Electronically Signed   By: Van Clines M.D.   On: 03/16/2019 13:48   Dg Fluoro Guide Cv Line-no Report  Result Date: 03/17/2019 Fluoroscopy was utilized by the requesting physician.  No radiographic interpretation.   Medications: Infusions: .  prismasol BGK 4/2.5 300 mL/hr at 03/18/19  1115  .  prismasol BGK 4/2.5 500 mL/hr at 03/18/19 1149  . sodium chloride    . sodium chloride Stopped (03/18/19 1009)  . dexmedetomidine (PRECEDEX) IV infusion 0.5 mcg/kg/hr (03/18/19 1200)  . feeding supplement (VITAL 1.5 CAL) 45 mL/hr at 03/17/19 2000  . fluconazole (DIFLUCAN) IV 100 mL/hr at 03/18/19 1200  . heparin 10,000 units/ 20 mL infusion syringe 1,000 Units/hr (03/18/19 1236)  . heparin 800 Units/hr (03/18/19 1236)  . HYDROmorphone Stopped (03/16/19 2302)  . norepinephrine (LEVOPHED) Adult infusion Stopped (03/18/19 0856)  . piperacillin-tazobactam Stopped (03/18/19 0834)  . prismasol BGK 4/2.5 1,500 mL/hr at 03/18/19 0814  . [START ON 03/19/2019] vancomycin      Scheduled Medications: . chlorhexidine  15 mL Mouth/Throat BID  . Chlorhexidine Gluconate Cloth  6 each Topical Daily  . docusate  100 mg Per Tube BID  . famotidine  20 mg Per Tube Daily  . feeding supplement (PRO-STAT SUGAR FREE 64)  60 mL Per Tube BID  . free water  100 mL Per Tube Q8H  . insulin aspart  0-15 Units Subcutaneous Q4H  . insulin aspart  6 Units Subcutaneous Q4H  . insulin glargine  15 Units Subcutaneous BID  . mouth rinse  15 mL Mouth Rinse 10 times per day  . oxyCODONE  10 mg Oral Q6H  . propofol  500 mg Intravenous Once  . QUEtiapine  75 mg Per Tube BID  . sennosides  5 mL Oral BID  . sodium chloride flush  10-40 mL Intracatheter Q12H    have reviewed scheduled and prn medications.    Physical Exam: General - adult male intubated  Card - S1S2 Pulm - occ rhonchi  abd - soft /nd  Ext - trace edema   RIJ tunneled catheter placed 5/13  Due to the nature of this patient's LWHKN-18 with isolation and in keeping with efforts to prevent the spread of infection and to conserve personal protective equipment, a physical exam was not personally performed.  Patient's symptoms and exam were discussed in detail with the RN.  A chart review of other providers notes and the patient's lab work as  well as review of other pertinent studies was performed.  Exam details from prior documentation were reviewed specifically and confirmed with the bedside nurse.  Location of service: [place]    03/18/2019,12:59 PM  LOS: 23 days
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
## 2019-03-18 NOTE — TOC Progression Note (Signed)
Transition of Care [facility]) - Progression Note    Patient Details  Name: Jonathan [name] MRN: [id number] Date of Birth: 02/23/1971  Transition of Care [facility]) CM/SW [place] RN, BSN, NCM-BC, ACM-RN [phone] (working remotely) Phone Number: 03/18/2019, 4:39 PM  Clinical Narrative:   CM received an incoming call from Terri Piedra MSW, Renal Navigator, with an update received on the OP HD referral. 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 The patient has traveled to the [place] on a work [place] for 7 years, and the [facility] will expire 11/20. Patient remains on CRRT and critically ill on vent support with extent of his DC needs unknown at this time. The CM team to continue to follow for coordination of care.     Expected Discharge Plan: [place] Barriers to Discharge: Continued Medical Work up(COVID (+); new trach; uninsured)  Expected Discharge Plan and Services Expected Discharge Plan: [place] arrangements for the past 2 months: Single Family Home   Social Determinants of Health (SDOH) Interventions    Readmission Risk Interventions Readmission Risk Prevention Plan 03/17/2019  Transportation Screening Not Complete  Medication Review Press photographer) Not Complete  HRI or Home Care Consult Not Complete  Palliative Care Screening Not Complete

## 2019-03-18 NOTE — Progress Notes (Signed)
[facility] PROGRESS NOTE    Progress Note  Jonathan [name]  BTD:[id number] DOB: 1971/01/07 DOA: 02/23/2019 PCP: Patient, No Pcp Per     Brief Narrative:   Jonathan [name] is an 48 y.o. male noninjected speaker arrived from [place] on 02/13/2019 lesion close quarters with 6 of the workers came in in respiratory distress intubated for ARDS due to COVID-19 infection started on CRRT, paralyzed and prone.  Medications: 4/22- Tocluzimab X 1 03/12/2019 on IV heparin drip. 03/07/2019, convalescent plasma 03/09/2019 paralytic stopped  Events: 02/23/2019 admitted to the ICU. 02/24/2019 started on CRRT 4/21-4/24 Proned daily 4/28 -4/30 proned daily  03/04/2019 increase in oxygen demand require paralytic for 24 hours. 03/09/2019 hematochezia from gastric aspirate  Lines and tubes: 4/21 - ETT >>5.11.2020 4/21 - RIJ CVC [facility] >> 4/22 L IJ HD Cath> 5/13 Permacath right  Antibiotics: 02/23/2019-425 2020 azithromycin and Rocephin 02/23/2019-02/24/2019 03/06/2019 Rocephin 5/11 Vancomycin 5/12 Zosyn 5/12 Fluconazole for candida in tracheal aspirate and recent Actemra   Assessment/Plan:   Acute respiratory failure with hypoxia/COVID-19/  ARDS (adult respiratory distress syndrome) ([place]) Continue vent management per PCCM on ARDS HD protocol. Extubated on 03/15/2019 and was stable throughout the day.  Overnight became in respiratory distress had to be placed on BiPAP. Re-Intubated on 03/16/2019 and had tracheostomy placed Currently on intravenous antibiotics (vancomycin and zosyn) Today, patient noted to have increasing oxygen requirements.  Echocardiogram performed shows right-sided heart failure.  He has been empirically started on heparin for concerns for underlying pulmonary embolus.  Shock  Patient has been hypotensive requiring levophed.  Shock may be related to right ventricular overload and pulmonary hypertension.  Echocardiogram has been ordered.  He is not febrile.  Continue  vasopressors for now and wean off as tolerated.   ATN: Continue on CRRT per Nephrology Underwent tunneled HD catheter on 03/17/2019. He is currently on 100 cc of free water per tube.  Anemia due to chronic illness and possibly blood loss bleed: Hemoglobin ranging between 7-9. No signs of ongoing blood loss. Continue to follow hemoglobin closely   Sinus tachycardia: tachycardia significantly improved, continue to use IV metoprolol PRN.  Pressure injury of skin on the nose:  Obese:   DVT prophylaxis: Heparin Family Communication: Wife Energy manager number [phone]. Updated by CCM Disposition Plan/Barrier to D/C: Keep in ICU. Code Status:     Code Status Orders  (From admission, onward)         Start     Ordered   03/02/19 1658  Limited resuscitation (code)  Continuous    Question Answer Comment  In the event of cardiac or respiratory ARREST: Initiate Code Blue, Call Rapid Response No   In the event of cardiac or respiratory ARREST: Perform CPR No   In the event of cardiac or respiratory ARREST: Perform Intubation/Mechanical Ventilation Yes   In the event of cardiac or respiratory ARREST: Use NIPPV/BiPAp only if indicated Yes   In the event of cardiac or respiratory ARREST: Administer ACLS medications if indicated Yes   In the event of cardiac or respiratory ARREST: Perform Defibrillation or Cardioversion if indicated Yes      03/02/19 1657        Code Status History    Date Active Date Inactive Code Status Order ID Comments User Context   02/23/2019 1051 03/02/2019 1657 Full Code [id number]  Kipp Brood, MD ED        IV Access:    Permacath right 5/13   Procedures and diagnostic studies:  Dg Chest 1 View  Result Date: 03/17/2019 CLINICAL DATA:  Central line placement. EXAM: CHEST  1 VIEW COMPARISON:  Radiograph of Mar 17, 2019. FINDINGS: The heart size and mediastinal contours are within normal limits. Tracheostomy and nasogastric tubes  are unchanged in position. Stable left internal jugular catheter is noted with tip in expected position of SVC. Interval placement of right internal jugular dialysis catheter with distal tip in right atrium. No pneumothorax or pleural effusion is noted. Stable bilateral lung opacities are noted concerning for pneumonia. The visualized skeletal structures are unremarkable. IMPRESSION: Interval placement of right internal jugular dialysis catheter with tip in right atrium. Otherwise stable support apparatus. Stable bilateral lung opacities are noted consistent with pneumonia. Electronically Signed   By: Marijo Conception M.D.   On: 03/17/2019 11:48   Dg Chest Port 1 View  Result Date: 03/18/2019 CLINICAL DATA:  Acute respiratory failure, known positive COVID-19 EXAM: PORTABLE CHEST 1 VIEW COMPARISON:  03/17/2019 FINDINGS: Cardiac shadow is stable. Tracheostomy tube and bilateral jugular catheters are again noted and stable. Gastric catheter extends into the stomach. Patchy infiltrates are noted throughout both lungs stable from the previous day. No acute bony abnormality is noted. IMPRESSION: Stable bilateral opacities Tubes and lines stable from the previous day. Electronically Signed   By: Inez Catalina M.D.   On: 03/18/2019 12:12   Dg Chest Port 1 View  Result Date: 03/17/2019 CLINICAL DATA:  Hypotension EXAM: PORTABLE CHEST 1 VIEW COMPARISON:  03/17/2019 FINDINGS: The left-sided central venous catheter tip projects over the left brachiocephalic vein. The right-sided tunneled dialysis catheter tip projects over the right atrium. The enteric tube extends below the level of the left hemidiaphragm. The tracheostomy tube terminates above the carina. Diffuse bilateral hazy airspace opacities are again noted. These have improved somewhat since the prior study. There is no pneumothorax. No large pleural effusion. The cardiac silhouette is stable from prior studies. IMPRESSION: 1. Lines and tubes as above. 2. Diffuse  hazy bilateral airspace opacities with some slight interval improvement when compared to prior study. Electronically Signed   By: Constance Holster M.D.   On: 03/17/2019 14:07   Dg Chest Port 1 View  Result Date: 03/17/2019 CLINICAL DATA:  48 year old male with COVID-19. EXAM: PORTABLE CHEST 1 VIEW COMPARISON:  03/16/2019 and earlier. FINDINGS: Portable AP semi upright view at 0438 hours. Stable tracheostomy tube. Enteric tube courses to the abdomen and the side hole is at the gastric body level. Stable left IJ approach dual lumen dialysis type catheter. Stable lung volumes and mediastinal contours. Coarse and patchy bilateral pulmonary opacity persists and is slightly greater in the left lung. Ventilation has mildly improved since 03/16/2019. No pneumothorax or pleural effusion. Negative visible bowel gas pattern. IMPRESSION: 1.  Stable lines and tubes. 2. Mildly improved bilateral ventilation since 03/16/2019. Bilateral COVID-19 pneumonia. Electronically Signed   By: Genevie Ann M.D.   On: 03/17/2019 06:09   Dg Fluoro Guide Cv Line-no Report  Result Date: 03/17/2019 Fluoroscopy was utilized by the requesting physician.  No radiographic interpretation.     Medical Consultants:   PCCM Nephrology  Anti-Infectives:   Azithromycin 4/21 >>4/25 Ceftriaxone 4/22 >>4/25 Hydroxychloroquine 4/21 > 4/22 Vancomycin 03/14/2019> Zosyn 03/16/2019>  Subjective:    Jonathan [name] is awake following commands, does not appear to be in distress  Objective:    Vitals:   03/18/19 1140 03/18/19 1300 03/18/19 1400 03/18/19 1550  BP: 118/85 (!) 88/56 (!) 91/57 (!) 90/58  Pulse: 96 88 90  84  Resp: (!) 27 (!) 22 (!) 34 (!) 27  Temp: 98.2 F (36.8 C) 98.2 F (36.8 C) 98.4 F (36.9 C) 98.6 F (37 C)  TempSrc:      SpO2: 98% 100% 100% 100%  Weight:      Height:        Intake/Output Summary (Last 24 hours) at 03/18/2019 1623 Last data filed at 03/18/2019 1600 Gross per 24 hour  Intake 3324.66 ml   Output 3417 ml  Net -92.34 ml   Filed Weights   03/15/19 0500 03/16/19 0500 03/18/19 0256  Weight: 64.6 kg 64.2 kg 65.7 kg    Exam: General exam: sedated, no distress Respiratory system: bilateral rhonchi. No wheezing. Cardiovascular system:RRR. No murmurs, rubs, gallops. Gastrointestinal system: Abdomen is nondistended, soft and nontender. No organomegaly or masses felt. Normal bowel sounds heard. Central nervous system: limited exam due to sedation. No focal neurological deficits. Extremities: No C/C/E, +pedal pulses Skin: No rashes, lesions or ulcers Psychiatry: unable to assess due to sedation     Data Reviewed:    Labs: Basic Metabolic Panel: Recent Labs  Lab 03/14/19 0941  03/15/19 0252  03/16/19 0153  03/16/19 1536 03/17/19 0230  03/17/19 1700 03/18/19 0220 03/18/19 0850 03/18/19 1029 03/18/19 1112  NA 136   < >  --    < > 134*   135   < > 135 134*   < > 135 137 134* 135 134*  K 5.0   < >  --    < > 4.8   4.7   < > 4.6 4.5   < > 4.2 4.0 4.4 4.8 4.9  CL 97*   < >  --    < > 100   101  --  101 100  --  102 102  --   --   --   CO2 22   < >  --    < > 22   22  --  23 24  --  24 27  --   --   --   GLUCOSE 157*   < >  --    < > 134*   135*  --  103* 204*  --  194* 91  --   --   --   BUN 61*   < >  --    < > 37*   38*  --  36* 42*  --  45* 38*  --   --   --   CREATININE 3.51*   < >  --    < > 2.28*   2.24*  --  2.24* 2.18*  --  2.30* 1.98*  --   --   --   CALCIUM 10.5*   < >  --    < > 9.3   9.4  --  8.7* 8.6*  --  8.1* 8.5*  --   --   --   MG 3.4*  --  3.1*  --  3.1*  --   --  3.0*  --   --  2.9*  --   --   --   PHOS 3.2   < >  --    < > 2.7   2.7  --  4.1 2.3*  --  4.2 3.1  --   --   --    < > = values in this interval not displayed.   GFR Estimated Creatinine Clearance: 39.7 mL/min (A) (by C-G formula based on  SCr of 1.98 mg/dL (H)). Liver Function Tests: Recent Labs  Lab 03/14/19 1206  03/16/19 0153 03/16/19 1536 03/17/19 0230 03/17/19 1700  03/18/19 0220  AST 29  --   --   --   --   --   --   ALT 30  --   --   --   --   --   --   ALKPHOS 144*  --   --   --   --   --   --   BILITOT 0.7  --   --   --   --   --   --   PROT 7.8  --   --   --   --   --   --   ALBUMIN 3.1*   < > 3.3* 3.0* 2.7* 2.5* 2.4*   < > = values in this interval not displayed.   No results for input(s): LIPASE, AMYLASE in the last 168 hours. No results for input(s): AMMONIA in the last 168 hours. Coagulation profile Recent Labs  Lab 03/14/19 1206 03/16/19 0845  INR 1.2 1.2   COVID-19 Labs  No results for input(s): DDIMER, FERRITIN, LDH, CRP in the last 72 hours.  Lab Results  Component Value Date   SARSCOV2NAA POSITIVE (A) 02/23/2019    CBC: Recent Labs  Lab 03/14/19 1206 03/15/19 0252  03/16/19 0153  03/17/19 0230 03/17/19 0425 03/18/19 0220 03/18/19 0850 03/18/19 1029 03/18/19 1112  WBC 17.8* 17.2*  --  22.0*  --  14.7*  --  12.8*  --   --   --   NEUTROABS 10.8*  --   --   --   --   --   --   --   --   --   --   HGB 8.0* 8.2*   < > 8.1*   < > 7.3* 7.8* 7.3* 7.5* 7.8* 9.5*  HCT 25.4* 26.4*   < > 26.1*   < > 25.0* 23.0* 24.9* 22.0* 23.0* 28.0*  MCV 98.8 98.5  --  96.7  --  100.4*  --  101.6*  --   --   --   PLT 196 193  --  195  --  181  --  194  --   --   --    < > = values in this interval not displayed.   Cardiac Enzymes: No results for input(s): CKTOTAL, CKMB, CKMBINDEX, TROPONINI in the last 168 hours. BNP (last 3 results) No results for input(s): PROBNP in the last 8760 hours. CBG: Recent Labs  Lab 03/17/19 1942 03/17/19 2327 03/18/19 0326 03/18/19 0757 03/18/19 1155  GLUCAP 121* 145* 123* 105* 192*   D-Dimer: No results for input(s): DDIMER in the last 72 hours. Hgb A1c: No results for input(s): HGBA1C in the last 72 hours. Lipid Profile: No results for input(s): CHOL, HDL, LDLCALC, TRIG, CHOLHDL, LDLDIRECT in the last 72 hours. Thyroid function studies: No results for input(s): TSH, T4TOTAL, T3FREE, THYROIDAB  in the last 72 hours.  Invalid input(s): FREET3 Anemia work up: No results for input(s): VITAMINB12, FOLATE, FERRITIN, TIBC, IRON, RETICCTPCT in the last 72 hours. Sepsis Labs: Recent Labs  Lab 03/14/19 1206 03/14/19 1207 03/14/19 1643 03/15/19 0252 03/16/19 0153 03/17/19 0230 03/18/19 0220  PROCALCITON 3.52  --   --   --   --   --   --   WBC 17.8*  --   --  17.2* 22.0* 14.7* 12.8*  LATICACIDVEN  --  1.2 1.2  --   --   --   --    Microbiology Recent Results (from the past 240 hour(s))  Culture, blood (Routine X 2) w Reflex to ID Panel     Status: None (Preliminary result)   Collection Time: 03/14/19 11:45 AM  Result Value Ref Range Status   Specimen Description   Final    BLOOD RIGHT ANTECUBITAL Performed at [facility] [address], [place], [place] [address]    Special Requests   Final    BOTTLES DRAWN AEROBIC ONLY Blood Culture adequate volume Performed at [facility] [address] [place], [place] [address]    Culture   Final    NO GROWTH 4 DAYS Performed at [facility], [place] [address] [place], [place] [address]    Report Status PENDING  Incomplete  Culture, blood (Routine X 2) w Reflex to ID Panel     Status: None (Preliminary result)   Collection Time: 03/14/19 11:50 AM  Result Value Ref Range Status   Specimen Description   Final    BLOOD RIGHT HAND Performed at [facility] [address] [place], [place] [address]    Special Requests   Final    BOTTLES DRAWN AEROBIC ONLY Blood Culture adequate volume Performed at [facility] [address], [place], [place] [address]    Culture   Final    NO GROWTH 4 DAYS Performed at [facility], [place] [address], [place], [place] [address]    Report Status PENDING  Incomplete     Medications:    chlorhexidine  15 mL Mouth/Throat BID   Chlorhexidine Gluconate Cloth  6 each Topical Daily   docusate  100 mg Per  Tube BID   famotidine  20 mg Per Tube Daily   feeding supplement (PRO-STAT SUGAR FREE 64)  60 mL Per Tube BID   free water  100 mL Per Tube Q8H   insulin aspart  0-15 Units Subcutaneous Q4H   insulin aspart  6 Units Subcutaneous Q4H   insulin glargine  15 Units Subcutaneous BID   mouth rinse  15 mL Mouth Rinse 10 times per day   oxyCODONE  10 mg Oral Q6H   propofol  500 mg Intravenous Once   QUEtiapine  75 mg Per Tube BID   sennosides  5 mL Oral BID   sodium chloride flush  10-40 mL Intracatheter Q12H   Continuous Infusions:   prismasol BGK 4/2.5 300 mL/hr at 03/18/19 1115    prismasol BGK 4/2.5 500 mL/hr at 03/18/19 1149   sodium chloride     sodium chloride 10 mL/hr at 03/18/19 1600   dexmedetomidine (PRECEDEX) IV infusion 0.6 mcg/kg/hr (03/18/19 1600)   feeding supplement (VITAL 1.5 CAL) 1,000 mL (03/18/19 1452)   fluconazole (DIFLUCAN) IV Stopped (03/18/19 1211)   heparin 10,000 units/ 20 mL infusion syringe 1,000 Units/hr (03/18/19 1600)   heparin 800 Units/hr (03/18/19 1600)   HYDROmorphone Stopped (03/16/19 2302)   norepinephrine (LEVOPHED) Adult infusion 3 mcg/min (03/18/19 1600)   piperacillin-tazobactam Stopped (03/18/19 1445)   prismasol BGK 4/2.5 1,500 mL/hr at 03/18/19 1519   [START ON 03/19/2019] vancomycin       LOS: 23 days   Kathie Dike, MD  [facility]  03/18/2019, 4:23 PM   Critical care: 44mins

## 2019-03-18 NOTE — Plan of Care (Signed)
  Problem: Coping: Goal: Psychosocial and spiritual needs will be supported Outcome: Progressing   

## 2019-03-18 NOTE — Progress Notes (Signed)
Per verbal order from Dr [place] Bells hourly removal rate to 50 mL.  SBP>90 and MAP >55.  Will continue to monitor.  Hiram Gash RN

## 2019-03-18 NOTE — Progress Notes (Signed)
[facility] PCCM  Worsening hypoxemia but paradoxically CXR not worse Unclear if new PE? Start heparin Check echo  Roselie Awkward, MD [place] [facility] Pager: [phone] Cell: [phone] If no response, call [phone]

## 2019-03-18 NOTE — Progress Notes (Signed)
NAME:  Jonathan [name], MRN:  [id number], DOB:  Jan 17, 1971, LOS: 35 ADMISSION DATE:  02/23/2019, CONSULTATION DATE:  02/23/2019 REFERRING MD:  Regenia Skeeter, CHIEF COMPLAINT:  Dyspnea   Brief History   48 y/o male admitted on February 23, 2019 with ARDS due to COVID-19.  He has had severe ARDS requiring prone positioning for many days, developed acute kidney injury requiring continuous renal replacement therapy.    Past Medical History  DM2 HTN  Significant Hospital Events   4/21 admitted and immediately proned on inverse ratio PC ventilation with high PEEP to maintain saturation >85%, Started on NO at 40ppm. 4/22- Tocluzimab X 1 4/22- started CVVHD 4/21-4/24 Proned daily 4/28 -4/30 proned daily , tapered NO to off  4/30 -late evening with increased O2 demands . No paralytics required x 24hr . No vent dysyncrony . Calm on Fent /Versed. Off precedex   5/1 - Remains on CRRT , no acute issues overnight --4.8 L I/O Balance since admit . Continues to Prone on/off 16 on and 8 off . -CXR stable diffuse bilateral infiltrates (slightly improved aeration ) . , Vent .80Fio2 .  5/3 convalescent plasma  Biomarkes improving but severe ARDS persists ad D-dimer still very high (13) without improvement. On IV Heparin gtt. Growing MSSA. SEdation changed to dilaudid gtt. Continues prone 16h and 8h syupine. Currently supine at 80% fio2, peep 18. On TF. STARTED NIMBEX  5/4-5/5-able to stay in the supine position, able to discontinue paralysis and maintain FiO2 0.50, PEEP 8-10.  5/5 hematochezia, bloody gastric aspirate  5/8 followed commands  5/11 extubated  5/12 re-intubated, tracheostomy  5/13 perm cath  Consults:  PCCM 4/21  Procedures:  4/21 - ETT 7.5 >> 4/21 - RIJ CVC [facility] >> 4/22 L IJ HD Cath>   Significant Diagnostic Tests:  POC U/S 4/21: Bilateral interstitial pattern with bilateral dense consolidation at bases. Echo shows low-normal LVSF. RV moderately dilated with  septal shift.  5/14 Echo> LVEF > 65%, RV dilated, decreased RV function (moderate) with RVSP 60  Micro Data:  Blood cultures -PND SARS-CoV2 02/23/2019 - - POSITIVE HIV 4/21 -neg Quant gold 4/2 1- neg .....................Marland Kitchen resp 4/29 >>rare MSSA and yeast  5/10 blood > neg   Anti-covid RX and antimicrobial RX  Azithromycin 4/21 >>4/25 Ceftriaxone 4/22 >>4/25 Actemra 4/22 Hydroxychloroquine 4/21 > 4/22 ........................... Ceftriaxone 5/2 >>5/10 Cefepime 5/10>>> Vancomycin 5/10>>>  Interim history/subjective:   Worsening oxygenation today CXR unchanged Shock resolved today   Objective   Blood pressure 118/85, pulse (!) 101, temperature 97.9 F (36.6 C), resp. rate (!) 35, height 5\' 5"  (1.651 m), weight 65.7 kg, SpO2 100 %.    Vent Mode: PCV FiO2 (%):  [30 %-60 %] 60 % Set Rate:  [20 bmp] 20 bmp Vt Set:  [500 mL] 500 mL PEEP:  [5 cmH20-14 cmH20] 14 cmH20 Pressure Support:  [5 cmH20] 5 cmH20 Plateau Pressure:  [16 cmH20-24 cmH20] 22 cmH20   Intake/Output Summary (Last 24 hours) at 03/18/2019 1230 Last data filed at 03/18/2019 1200 Gross per 24 hour  Intake 3593.62 ml  Output 2409 ml  Net 1184.62 ml   Filed Weights   03/15/19 0500 03/16/19 0500 03/18/19 0256  Weight: 64.6 kg 64.2 kg 65.7 kg    Examination:  General:  In bed on vent HENT: NCAT trach in place PULM: CTA B, vent supported breathing CV: RRR, no mgr GI: BS+, soft, nontender MSK: normal bulk and tone Neuro: follows commands on vent  5/13 CXR images personally  reviewed> unchanged bilateral infiltrates, tracheostomy in place, HD cath in place   Resolved Hospital Problem list    Assessment & Plan:  Worsening hypoxemia despite minimal change in CXR: pulmonary embolism? Echo showed RV overload, pulmonary hypertension today, so worrisome for PE > start heparin infusion > stat echo, done > hold off on CT angiogram in case of renal recovery > OK to start pulling fluid with CVVHD to  avoid volume overload  Acute respiratory failure with hypoxemia, recovering ARDS from COVID 19 > continue full mechanical vent support, titrating PEEP and FiO2 to maintain O2 saturation > 88% > trach care per routine > pressure support as tolerated  AKI, oliguric > CVVHD per renal  General > try to place IV and remove CVL  Need for mechanical ventilation, sedation > continue precedex as needed today  Best practice:  Diet: tube feeding Pain/Anxiety/Delirium protocol (if indicated): precedex, RASS goal 0 to -1 VAP protocol (if indicated): yes DVT prophylaxis: heparin infusion GI prophylaxis: PPI Glucose control: SSI Mobility: none baseline Code Status: limited code, no CPR Family Communication: wife updated by phone today Disposition: remain in ICU  Labs   CBC: Recent Labs  Lab 03/14/19 1206 03/15/19 0252  03/16/19 0153  03/17/19 0230 03/17/19 0425 03/18/19 0220 03/18/19 0850 03/18/19 1029 03/18/19 1112  WBC 17.8* 17.2*  --  22.0*  --  14.7*  --  12.8*  --   --   --   NEUTROABS 10.8*  --   --   --   --   --   --   --   --   --   --   HGB 8.0* 8.2*   < > 8.1*   < > 7.3* 7.8* 7.3* 7.5* 7.8* 9.5*  HCT 25.4* 26.4*   < > 26.1*   < > 25.0* 23.0* 24.9* 22.0* 23.0* 28.0*  MCV 98.8 98.5  --  96.7  --  100.4*  --  101.6*  --   --   --   PLT 196 193  --  195  --  181  --  194  --   --   --    < > = values in this interval not displayed.    Basic Metabolic Panel: Recent Labs  Lab 03/14/19 0941  03/15/19 0252  03/16/19 0153  03/16/19 1536 03/17/19 0230  03/17/19 1700 03/18/19 0220 03/18/19 0850 03/18/19 1029 03/18/19 1112  NA 136   < >  --    < > 134*  135   < > 135 134*   < > 135 137 134* 135 134*  K 5.0   < >  --    < > 4.8  4.7   < > 4.6 4.5   < > 4.2 4.0 4.4 4.8 4.9  CL 97*   < >  --    < > 100  101  --  101 100  --  102 102  --   --   --   CO2 22   < >  --    < > 22  22  --  23 24  --  24 27  --   --   --   GLUCOSE 157*   < >  --    < > 134*  135*  --   103* 204*  --  194* 91  --   --   --   BUN 61*   < >  --    < >  37*  38*  --  36* 42*  --  45* 38*  --   --   --   CREATININE 3.51*   < >  --    < > 2.28*  2.24*  --  2.24* 2.18*  --  2.30* 1.98*  --   --   --   CALCIUM 10.5*   < >  --    < > 9.3  9.4  --  8.7* 8.6*  --  8.1* 8.5*  --   --   --   MG 3.4*  --  3.1*  --  3.1*  --   --  3.0*  --   --  2.9*  --   --   --   PHOS 3.2   < >  --    < > 2.7  2.7  --  4.1 2.3*  --  4.2 3.1  --   --   --    < > = values in this interval not displayed.   GFR: Estimated Creatinine Clearance: 39.7 mL/min (A) (by C-G formula based on SCr of 1.98 mg/dL (H)). Recent Labs  Lab 03/14/19 1206 03/14/19 1207 03/14/19 1643 03/15/19 0252 03/16/19 0153 03/17/19 0230 03/18/19 0220  PROCALCITON 3.52  --   --   --   --   --   --   WBC 17.8*  --   --  17.2* 22.0* 14.7* 12.8*  LATICACIDVEN  --  1.2 1.2  --   --   --   --     Liver Function Tests: Recent Labs  Lab 03/14/19 1206  03/16/19 0153 03/16/19 1536 03/17/19 0230 03/17/19 1700 03/18/19 0220  AST 29  --   --   --   --   --   --   ALT 30  --   --   --   --   --   --   ALKPHOS 144*  --   --   --   --   --   --   BILITOT 0.7  --   --   --   --   --   --   PROT 7.8  --   --   --   --   --   --   ALBUMIN 3.1*   < > 3.3* 3.0* 2.7* 2.5* 2.4*   < > = values in this interval not displayed.   No results for input(s): LIPASE, AMYLASE in the last 168 hours. No results for input(s): AMMONIA in the last 168 hours.  ABG    Component Value Date/Time   PHART 7.381 03/18/2019 1112   PCO2ART 42.1 03/18/2019 1112   PO2ART 50.0 (L) 03/18/2019 1112   HCO3 25.1 03/18/2019 1112   TCO2 26 03/18/2019 1112   ACIDBASEDEF 1.0 03/16/2019 0450   O2SAT 85.0 03/18/2019 1112     Coagulation Profile: Recent Labs  Lab 03/14/19 1206 03/16/19 0845  INR 1.2 1.2    Cardiac Enzymes: No results for input(s): CKTOTAL, CKMB, CKMBINDEX, TROPONINI in the last 168 hours.  HbA1C: No results found for: HGBA1C  CBG:  Recent Labs  Lab 03/17/19 1942 03/17/19 2327 03/18/19 0326 03/18/19 0757 03/18/19 1155  GLUCAP 121* 145* 123* 105* 192*     Critical care time: 45 minutes     Roselie Awkward, MD [place] [facility] Pager: [phone] Cell: [phone] If no response, call [phone]

## 2019-03-18 NOTE — Progress Notes (Signed)
Pharmacy Antibiotic Note  Jonathan [name] is a 48 y.o. male admitted on 02/23/2019 with sepsis.  Currently on IV vancomycin, cefepime and fluconazole. CRRT remains ongoing. WBC has improved to 12.8 today.   Plan: Increase vancomycin to 1 gm IV Q 24 hours  Continue Zosyn 3.375 gm IV Q 6 hours  Continue fluconazole 400 mg daily per MD  Monitor clinical picture, renal function, vanc levels prn F/U C&S, abx deescalation / LOT  Height: 5\' 5"  (165.1 cm) Weight: 144 lb 13.5 oz (65.7 kg) IBW/kg (Calculated) : 61.5  Temp (24hrs), Avg:97.6 F (36.4 C), Min:96.8 F (36 C), Max:99.5 F (37.5 C)  Recent Labs  Lab 03/14/19 1206 03/14/19 1207  03/14/19 1643 03/15/19 0252  03/16/19 0153 03/16/19 1536 03/17/19 0230 03/17/19 1700 03/18/19 0220 03/18/19 1130  WBC 17.8*  --   --   --  17.2*  --  22.0*  --  14.7*  --  12.8*  --   CREATININE 3.67*  --    < >  --   --    < > 2.28*  2.24* 2.24* 2.18* 2.30* 1.98*  --   LATICACIDVEN  --  1.2  --  1.2  --   --   --   --   --   --   --   --   VANCOTROUGH  --   --   --   --   --   --   --   --   --   --   --  13*   < > = values in this interval not displayed.    Estimated Creatinine Clearance: 39.7 mL/min (A) (by C-G formula based on SCr of 1.98 mg/dL (H)).    Plaquenil 4/21>>4/22 Azithromycin 4/21>>4/25; 5/2>>5/8 Rocephin 4/21>>4/25; 5/2>>(5/9) 4/22 Tocilizumab x 1  5/3 convalescent plasma x 1 Vancomycin 5/10>> Cefepime 5/10>>5/12 Zosyn 5/12>> Fluconazole 5/12>>   5/14 22hr Vanc level = 13 on vanc 750 mg IV Q 24 hours    4/21 COVID: positive 4/21 MRSA: neg 4/21 Blood cx: ngF 4/22 TB: neg  4/29 TA: MSSA, rare candida albicans 5/10 BCx2>> ngtd   Thank you for allowing pharmacy to be a part of this patient's care.  Albertina Parr, PharmD., BCPS Clinical Pharmacist Clinical phone for 03/18/19 until 5pm: [phone]

## 2019-03-18 NOTE — Progress Notes (Signed)
  Echocardiogram 2D Echocardiogram has been performed.  Darlina Sicilian M 03/18/2019, 2:06 PM

## 2019-03-18 NOTE — Progress Notes (Signed)
   PT Cancellation Note  Patient Details Name: Jonathan [name] MRN: [id number] DOB: [birth year]   Cancelled Treatment:    Reason Eval/Treat Not Completed: Medical issues which prohibited therapy, will follow and initiate PT when medically ready . Noted on Precedex and pressors.    [name] 03/18/2019, 7:25 AM [facility] Pager [phone] Office [phone]

## 2019-03-18 NOTE — Progress Notes (Signed)
[facility] P[facility]M  I called his wife with the assistance of a translator and updated her.  Questions were answered.  Roselie Awkward, MD [facility] PCCM Pager: [phone] Cell: [phone] If no response, call [phone]

## 2019-03-18 NOTE — Progress Notes (Signed)
Renal Navigator received call from [phone], who introduced herself as Technical sales engineer Rep from the [employer]. She states Jonathan [name]/patient's employer has his SSN and can provide it to Renal Navigator if needed. Renal Navigator explained role and asked if [employer] Rep has information about patient's insurance information in [place], should he need to be set up with medical treatment in [place] after discharge. She does not, but states [name] may.  Union Rep explained that patient's "Guest Work Visa" expires in November and that it is possible for him to remain in the [place] after he is discharged from the hospital even if he is not capable of working. The [employer] would be responsible for setting him up with housing and transportation.  Renal Navigator then received a call from [place] who provided patient's SSN, however, Renal Navigator sees it is in Epic already and states that she will provide a copy of his Passport if needed. She is available as needed to support patient. Renal Navigator spoke with CM/Natalie and CSW/Ashley to update.  Alphonzo Cruise Renal Navigator [phone]

## 2019-03-19 ENCOUNTER — Encounter (HOSPITAL_COMMUNITY): Payer: Self-pay | Admitting: Vascular Surgery

## 2019-03-19 ENCOUNTER — Inpatient Hospital Stay (HOSPITAL_COMMUNITY): Payer: HRSA Program

## 2019-03-19 LAB — RENAL FUNCTION PANEL
Albumin: 2.8 g/dL — ABNORMAL LOW (ref 3.5–5.0)
Albumin: 2.9 g/dL — ABNORMAL LOW (ref 3.5–5.0)
Anion gap: 10 (ref 5–15)
Anion gap: 13 (ref 5–15)
BUN: 33 mg/dL — ABNORMAL HIGH (ref 6–20)
BUN: 34 mg/dL — ABNORMAL HIGH (ref 6–20)
CO2: 26 mmol/L (ref 22–32)
CO2: 25 mmol/L (ref 22–32)
Calcium: 9.1 mg/dL (ref 8.9–10.3)
Calcium: 9 mg/dL (ref 8.9–10.3)
Chloride: 98 mmol/L (ref 98–111)
Chloride: 98 mmol/L (ref 98–111)
Creatinine, Ser: 1.82 mg/dL — ABNORMAL HIGH (ref 0.61–1.24)
Creatinine, Ser: 1.81 mg/dL — ABNORMAL HIGH (ref 0.61–1.24)
GFR calc Af Amer: 50 mL/min — ABNORMAL LOW (ref >60)
GFR calc Af Amer: 50 mL/min — ABNORMAL LOW (ref >60)
GFR calc non Af Amer: 43 mL/min — ABNORMAL LOW (ref >60)
GFR calc non Af Amer: 43 mL/min — ABNORMAL LOW (ref >60)
Glucose, Bld: 175 mg/dL — ABNORMAL HIGH (ref 70–99)
Glucose, Bld: 170 mg/dL — ABNORMAL HIGH (ref 70–99)
Phosphorus: 2.3 mg/dL — ABNORMAL LOW (ref 2.5–4.6)
Phosphorus: 2 mg/dL — ABNORMAL LOW (ref 2.5–4.6)
Potassium: 5.3 mmol/L — ABNORMAL HIGH (ref 3.5–5.1)
Potassium: 4.6 mmol/L (ref 3.5–5.1)
Sodium: 134 mmol/L — ABNORMAL LOW (ref 135–145)
Sodium: 136 mmol/L (ref 135–145)

## 2019-03-19 LAB — CULTURE, BLOOD (ROUTINE X 2)
Culture: NO GROWTH
Culture: NO GROWTH
Special Requests: ADEQUATE
Special Requests: ADEQUATE

## 2019-03-19 LAB — APTT: aPTT: 169 seconds (ref 24–36)

## 2019-03-19 LAB — CBC
HCT: 25.1 % — ABNORMAL LOW (ref 39.0–52.0)
Hemoglobin: 7.5 g/dL — ABNORMAL LOW (ref 13.0–17.0)
MCH: 30.4 pg (ref 26.0–34.0)
MCHC: 29.9 g/dL — ABNORMAL LOW (ref 30.0–36.0)
MCV: 101.6 fL — ABNORMAL HIGH (ref 80.0–100.0)
Platelets: 213 10*3/uL (ref 150–400)
RBC: 2.47 MIL/uL — ABNORMAL LOW (ref 4.22–5.81)
RDW: 18.4 % — ABNORMAL HIGH (ref 11.5–15.5)
WBC: 15.9 10*3/uL — ABNORMAL HIGH (ref 4.0–10.5)
nRBC: 0.5 % — ABNORMAL HIGH (ref 0.0–0.2)

## 2019-03-19 LAB — GLUCOSE, CAPILLARY
Glucose-Capillary: 191 mg/dL — ABNORMAL HIGH (ref 70–99)
Glucose-Capillary: 196 mg/dL — ABNORMAL HIGH (ref 70–99)
Glucose-Capillary: 203 mg/dL — ABNORMAL HIGH (ref 70–99)
Glucose-Capillary: 163 mg/dL — ABNORMAL HIGH (ref 70–99)
Glucose-Capillary: 190 mg/dL — ABNORMAL HIGH (ref 70–99)

## 2019-03-19 LAB — BASIC METABOLIC PANEL
Anion gap: 10 (ref 5–15)
BUN: 32 mg/dL — ABNORMAL HIGH (ref 6–20)
CO2: 26 mmol/L (ref 22–32)
Calcium: 8.9 mg/dL (ref 8.9–10.3)
Chloride: 98 mmol/L (ref 98–111)
Creatinine, Ser: 1.81 mg/dL — ABNORMAL HIGH (ref 0.61–1.24)
GFR calc Af Amer: 50 mL/min — ABNORMAL LOW (ref >60)
GFR calc non Af Amer: 43 mL/min — ABNORMAL LOW (ref >60)
Glucose, Bld: 176 mg/dL — ABNORMAL HIGH (ref 70–99)
Potassium: 5.3 mmol/L — ABNORMAL HIGH (ref 3.5–5.1)
Sodium: 134 mmol/L — ABNORMAL LOW (ref 135–145)

## 2019-03-19 LAB — HEPARIN LEVEL (UNFRACTIONATED)
Heparin Unfractionated: 0.81 IU/mL — ABNORMAL HIGH (ref 0.30–0.70)
Heparin Unfractionated: 0.41 IU/mL (ref 0.30–0.70)
Heparin Unfractionated: 0.56 IU/mL (ref 0.30–0.70)

## 2019-03-19 LAB — MAGNESIUM: Magnesium: 2.8 mg/dL — ABNORMAL HIGH (ref 1.7–2.4)

## 2019-03-19 MED ORDER — MIDAZOLAM HCL 2 MG/2ML IJ SOLN: 2.0000 mg | INTRAMUSCULAR | Status: DC | PRN

## 2019-03-19 MED ORDER — SODIUM PHOSPHATES 45 MMOLE/15ML IV SOLN
20.0000 mmol | Freq: Once | INTRAVENOUS | Status: AC
  Administered 2019-03-19: 16:00:00 20 mmol via INTRAVENOUS
  Filled 2019-03-19: qty 6.67

## 2019-03-19 MED ORDER — HYDROMORPHONE HCL 1 MG/ML IJ SOLN
1.0000 mg | INTRAMUSCULAR | Status: DC | PRN
  Filled 2019-03-19 (×7): qty 1

## 2019-03-19 MED ORDER — HYDROMORPHONE HCL 1 MG/ML IJ SOLN
1.0000 mg | INTRAMUSCULAR | Status: DC | PRN
  Administered 2019-03-19 – 2019-03-22 (×7): 1 mg via INTRAVENOUS
  Filled 2019-03-19 (×7): qty 3

## 2019-03-19 NOTE — Progress Notes (Signed)
ANTICOAGULATION CONSULT NOTE  Pharmacy Consult for IV UFH Indication: Suspected PE  Not on File  Patient Measurements: Height: 5\' 5"  (165.1 cm) Weight: 146 lb 9.7 oz (66.5 kg) IBW/kg (Calculated) : 61.5 Heparin Dosing Weight: 66 kg  Vital Signs: Temp: 99.1 F (37.3 C) (05/15 1600) Temp Source: Core (05/15 1600) BP: 103/74 (05/15 1830) Pulse Rate: 108 (05/15 1830)  Labs: Recent Labs    03/17/19 0230  03/18/19 0220  03/18/19 1029 03/18/19 1112 03/18/19 1600  03/19/19 0130 03/19/19 0840 03/19/19 1632 03/19/19 1643  HGB 7.3*   < > 7.3*   < > 7.8* 9.5*  --   --  7.5*  --   --   --   HCT 25.0*   < > 24.9*   < > 23.0* 28.0*  --   --  25.1*  --   --   --   PLT 181  --  194  --   --   --   --   --  213  --   --   --   APTT 128*  --  172*  --   --   --   --   --  169*  --   --   --   HEPARINUNFRC  --   --   --   --   --   --   --    < > 0.81* 0.41  --  0.56  CREATININE 2.18*   < > 1.98*  --   --   --  1.77*  --  1.82*  1.81*  --  1.81*  --    < > = values in this interval not displayed.    Estimated Creatinine Clearance: 43.4 mL/min (A) (by C-G formula based on SCr of 1.81 mg/dL (H)).  Assessment: 5 yom with complications from OVFIE-33 including acute respiratory failure s/p tracheostomy and ATN on CRRT now s/p permcath. CCM transitioned to heparin drip 2/2 suspected PE.   Currently receiving IV heparin 700 units/hr along with heparin 1000 units/hr via CRRT circuit. HL is now therapeutic at 0.41. H/H low, Plt wnl   03/19/19 6:40 PM  - HL remains at goal - No bleeding or line issues per RN - Heparin continues at 1000 units/hr through the circuit   Goal of Therapy:  Heparin level 0.3-0.7 units/ml Monitor platelets by anticoagulation protocol: Yes   Plan:  - Continue IV heparin at 700 units/hr  - F/U AM labs - Daily CBC/HL - Monitor for bleeding     Ulice Dash, PharmD Clinical Pharmacist

## 2019-03-19 NOTE — Progress Notes (Signed)
Assisted tele visit to patient with daughter and other family members x2  today  Maryelizabeth Rowan, RN

## 2019-03-19 NOTE — Progress Notes (Signed)
ANTICOAGULATION CONSULT NOTE  Pharmacy Consult for IV UFH Indication: Suspected PE  Not on File  Patient Measurements: Height: 5\' 5"  (165.1 cm) Weight: 146 lb 9.7 oz (66.5 kg) IBW/kg (Calculated) : 61.5 Heparin Dosing Weight: 66 kg  Vital Signs: Temp: 98.8 F (37.1 C) (05/15 0800) Temp Source: Core (05/15 0700) BP: 103/63 (05/15 0800) Pulse Rate: 86 (05/15 0800)  Labs: Recent Labs    03/17/19 0230  03/18/19 0220  03/18/19 1029 03/18/19 1112 03/18/19 1600 03/18/19 2030 03/19/19 0130 03/19/19 0840  HGB 7.3*   < > 7.3*   < > 7.8* 9.5*  --   --  7.5*  --   HCT 25.0*   < > 24.9*   < > 23.0* 28.0*  --   --  25.1*  --   PLT 181  --  194  --   --   --   --   --  213  --   APTT 128*  --  172*  --   --   --   --   --  169*  --   HEPARINUNFRC  --   --   --   --   --   --   --  0.95* 0.81* 0.41  CREATININE 2.18*   < > 1.98*  --   --   --  1.77*  --  1.82*  1.81*  --    < > = values in this interval not displayed.    Estimated Creatinine Clearance: 43.2 mL/min (A) (by C-G formula based on SCr of 1.82 mg/dL (H)).  Assessment: 60 yom with complications from ZPHXT-05 including acute respiratory failure s/p tracheostomy and ATN on CRRT now s/p permcath. CCM transitioned to heparin drip 2/2 suspected PE.   Currently receiving IV heparin 700 units/hr along with heparin 1000 units/hr via CRRT circuit. HL is now therapeutic at 0.41. H/H low, Plt wnl    Goal of Therapy:  Heparin level 0.3-0.7 units/ml Monitor platelets by anticoagulation protocol: Yes   Plan:  - Continue IV heparin at 700 units/hr  - Will check confirmatory heparin level in 8 hours.  - Daily CBC/HL - Monitor for bleeding     Albertina Parr, PharmD., BCPS Clinical Pharmacist Clinical phone for 03/19/19 until 5pm: [phone]

## 2019-03-19 NOTE — Progress Notes (Signed)
Wasted 55 ml expired dilaudid in stericycle with Rick Duff RN.  Lucius Conn, RN

## 2019-03-19 NOTE — Progress Notes (Signed)
NAME:  Jonathan [name], MRN:  [id number], DOB:  09-08-1971, LOS: 24 ADMISSION DATE:  02/23/2019, CONSULTATION DATE:  02/23/2019 REFERRING MD:  Regenia Skeeter, CHIEF COMPLAINT:  Dyspnea   Brief History   48 y/o male admitted on February 23, 2019 with ARDS due to COVID-19.  He has had severe ARDS requiring prone positioning for many days, developed acute kidney injury requiring continuous renal replacement therapy.    Past Medical History  DM2 HTN  Significant Hospital Events   4/21 admitted and immediately proned on inverse ratio PC ventilation with high PEEP to maintain saturation >85%, Started on NO at 40ppm. 4/22- Tocluzimab X 1 4/22- started CVVHD 4/21-4/24 Proned daily 4/28 -4/30 proned daily , tapered NO to off  4/30 -late evening with increased O2 demands . No paralytics required x 24hr . No vent dysyncrony . Calm on Fent /Versed. Off precedex   5/1 - Remains on CRRT , no acute issues overnight --4.8 L I/O Balance since admit . Continues to Prone on/off 16 on and 8 off . -CXR stable diffuse bilateral infiltrates (slightly improved aeration ) . , Vent .80Fio2 .  5/3 convalescent plasma  Biomarkes improving but severe ARDS persists ad D-dimer still very high (13) without improvement. On IV Heparin gtt. Growing MSSA. SEdation changed to dilaudid gtt. Continues prone 16h and 8h syupine. Currently supine at 80% fio2, peep 18. On TF. STARTED NIMBEX  5/4-5/5-able to stay in the supine position, able to discontinue paralysis and maintain FiO2 0.50, PEEP 8-10.  5/5 hematochezia, bloody gastric aspirate  5/8 followed commands  5/11 extubated  5/12 re-intubated, tracheostomy  5/13 perm cath  Consults:  PCCM 4/21  Procedures:  4/21 - ETT 7.5 >> 4/21 - RIJ CVC [facility] >> 4/22 L IJ HD Cath>   Significant Diagnostic Tests:  POC U/S 4/21: Bilateral interstitial pattern with bilateral dense consolidation at bases. Echo shows low-normal LVSF. RV moderately dilated with  septal shift.  5/14 Echo> LVEF > 65%, RV dilated, decreased RV function (moderate) with RVSP 60  Micro Data:  Blood cultures -PND SARS-CoV2 02/23/2019 - - POSITIVE HIV 4/21 -neg Quant gold 4/2 1- neg .....................Marland Kitchen resp 4/29 >>rare MSSA and yeast  5/10 blood > neg   Anti-covid RX and antimicrobial RX  Azithromycin 4/21 >>4/25 Ceftriaxone 4/22 >>4/25 Actemra 4/22 Hydroxychloroquine 4/21 > 4/22 ........................... Ceftriaxone 5/2 >>5/10 Cefepime 5/10>>> Vancomycin 5/10>>>  Interim history/subjective:   O2 saturation within normal limits Weaning PEEP today FiO2 at 40% Still following commands On low-dose Levophed  Objective   Blood pressure 101/68, pulse 82, temperature 98.8 F (37.1 C), temperature source Core, resp. rate (!) 27, height 5\' 5"  (1.651 m), weight 66.5 kg, SpO2 100 %.    Vent Mode: PCV FiO2 (%):  [40 %-60 %] 40 % Set Rate:  [20 bmp] 20 bmp PEEP:  [10 cmH20-14 cmH20] 12 cmH20 Plateau Pressure:  [24 cmH20-32 cmH20] 32 cmH20   Intake/Output Summary (Last 24 hours) at 03/19/2019 2951 Last data filed at 03/19/2019 0700 Gross per 24 hour  Intake 2479.22 ml  Output 4180 ml  Net -1700.78 ml   Filed Weights   03/16/19 0500 03/18/19 0256 03/19/19 0415  Weight: 64.2 kg 65.7 kg 66.5 kg    Examination:  General:  In bed on vent HENT: NCAT trach PULM: CTA B, vent supported breathing CV: RRR, no mgr GI: BS+, soft, nontender MSK: normal bulk and tone Neuro: awake, follows commands    5/14 CXR images personally reviewed> unchanged bilateral infiltrates, tracheostomy  in place, HD cath in place   Resolved Hospital Problem list    Assessment & Plan:  Worsening hypoxemia despite minimal change in CXR: pulmonary embolism? Echo showed RV overload, pulmonary hypertension today, so worrisome for PE Continue heparin infusion Hold off on CT angiogram in case of renal recovery Given RV overload, okay to continue slow volume removal via CVVHD   Acute respiratory failure with hypoxemia, recovering ARDS from COVID 19 Continue full mechanical ventilatory support following SaO2 Unclear why PaO2 and SaO2 discrepancy exist Decrease PEEP Target SaO2 greater than 88% Pressure support as tolerated hopefully tomorrow Tracheostomy care per routine   AKI, oliguric CVVHD per renal Monitor urine output  General Place IV, remove HD cath  Need for mechanical ventilation, sedation Wean off precedex Low dose prn dilaudid for air hunger, pain  Constipation Miralax today  Best practice:  Diet: tube feeding Pain/Anxiety/Delirium protocol (if indicated): precedex, RASS goal 0 to -1 VAP protocol (if indicated): yes DVT prophylaxis: heparin infusion GI prophylaxis: PPI Glucose control: SSI Mobility: none baseline Code Status: limited code, no CPR Family Communication: wife updated by phone on 5/14, will call again Disposition: remain in ICU  Labs   CBC: Recent Labs  Lab 03/14/19 1206 03/15/19 0252  03/16/19 0153  03/17/19 0230  03/18/19 0220 03/18/19 0850 03/18/19 1029 03/18/19 1112 03/19/19 0130  WBC 17.8* 17.2*  --  22.0*  --  14.7*  --  12.8*  --   --   --  15.9*  NEUTROABS 10.8*  --   --   --   --   --   --   --   --   --   --   --   HGB 8.0* 8.2*   < > 8.1*   < > 7.3*   < > 7.3* 7.5* 7.8* 9.5* 7.5*  HCT 25.4* 26.4*   < > 26.1*   < > 25.0*   < > 24.9* 22.0* 23.0* 28.0* 25.1*  MCV 98.8 98.5  --  96.7  --  100.4*  --  101.6*  --   --   --  101.6*  PLT 196 193  --  195  --  181  --  194  --   --   --  213   < > = values in this interval not displayed.    Basic Metabolic Panel: Recent Labs  Lab 03/15/19 0252  03/16/19 0153  03/17/19 0230  03/17/19 1700 03/18/19 0220 03/18/19 0850 03/18/19 1029 03/18/19 1112 03/18/19 1600 03/19/19 0130  NA  --    < > 134*  135   < > 134*   < > 135 137 134* 135 134* 135 134*  134*  K  --    < > 4.8  4.7   < > 4.5   < > 4.2 4.0 4.4 4.8 4.9 4.6 5.3*  5.3*  CL  --    < >  100  101   < > 100  --  102 102  --   --   --  99 98  98  CO2  --    < > 22  22   < > 24  --  24 27  --   --   --  24 26  26   GLUCOSE  --    < > 134*  135*   < > 204*  --  194* 91  --   --   --  141* 175*  176*  BUN  --    < > 37*  38*   < > 42*  --  45* 38*  --   --   --  31* 33*  32*  CREATININE  --    < > 2.28*  2.24*   < > 2.18*  --  2.30* 1.98*  --   --   --  1.77* 1.82*  1.81*  CALCIUM  --    < > 9.3  9.4   < > 8.6*  --  8.1* 8.5*  --   --   --  8.7* 9.1  8.9  MG 3.1*  --  3.1*  --  3.0*  --   --  2.9*  --   --   --   --  2.8*  PHOS  --    < > 2.7  2.7   < > 2.3*  --  4.2 3.1  --   --   --  2.7 2.3*   < > = values in this interval not displayed.   GFR: Estimated Creatinine Clearance: 43.2 mL/min (A) (by C-G formula based on SCr of 1.82 mg/dL (H)). Recent Labs  Lab 03/14/19 1206 03/14/19 1207 03/14/19 1643  03/16/19 0153 03/17/19 0230 03/18/19 0220 03/19/19 0130  PROCALCITON 3.52  --   --   --   --   --   --   --   WBC 17.8*  --   --    < > 22.0* 14.7* 12.8* 15.9*  LATICACIDVEN  --  1.2 1.2  --   --   --   --   --    < > = values in this interval not displayed.    Liver Function Tests: Recent Labs  Lab 03/14/19 1206  03/17/19 0230 03/17/19 1700 03/18/19 0220 03/18/19 1600 03/19/19 0130  AST 29  --   --   --   --   --   --   ALT 30  --   --   --   --   --   --   ALKPHOS 144*  --   --   --   --   --   --   BILITOT 0.7  --   --   --   --   --   --   PROT 7.8  --   --   --   --   --   --   ALBUMIN 3.1*   < > 2.7* 2.5* 2.4* 2.6* 2.8*   < > = values in this interval not displayed.   No results for input(s): LIPASE, AMYLASE in the last 168 hours. No results for input(s): AMMONIA in the last 168 hours.  ABG    Component Value Date/Time   PHART 7.381 03/18/2019 1112   PCO2ART 42.1 03/18/2019 1112   PO2ART 50.0 (L) 03/18/2019 1112   HCO3 25.1 03/18/2019 1112   TCO2 26 03/18/2019 1112   ACIDBASEDEF 1.0 03/16/2019 0450   O2SAT 85.0 03/18/2019 1112      Coagulation Profile: Recent Labs  Lab 03/14/19 1206 03/16/19 0845  INR 1.2 1.2    Cardiac Enzymes: No results for input(s): CKTOTAL, CKMB, CKMBINDEX, TROPONINI in the last 168 hours.  HbA1C: No results found for: HGBA1C  CBG: Recent Labs  Lab 03/18/19 1155 03/18/19 1618 03/18/19 1926 03/18/19 2305 03/19/19 0316  GLUCAP 192* 131* 118* 170* 191*     Critical care time: 35 minutes     Roselie Awkward, MD [facility]  PCCM Pager: [phone] Cell: [phone] If no response, call [phone]

## 2019-03-19 NOTE — Progress Notes (Signed)
Subjective:  160 UOP recorded  - norepi still on mod dose  - no machine issues reported  Objective Vital signs in last 24 hours: Vitals:   03/19/19 0750 03/19/19 0800 03/19/19 1240 03/19/19 1300  BP: 102/68 103/63 103/70 105/73  Pulse: 92 86 98 89  Resp: (!) 29 (!) 30 (!) 28   Temp: 98.8 F (37.1 C) 98.8 F (37.1 C)  99.5 F (37.5 C)  TempSrc:    Core  SpO2: 99% 99% 98% 99%  Weight:      Height:       Weight change: 0.8 kg  Intake/Output Summary (Last 24 hours) at 03/19/2019 1318 Last data filed at 03/19/2019 1300 Gross per 24 hour  Intake 2697.58 ml  Output 4065 ml  Net -1367.42 ml    Assessment/ Plan: Pt is a 48 y.o. yo male who was admitted on 02/23/2019 with anuric AKI due to ATN in the setting of COVID  Assessment/Plan: 1. AKI-  On CRRT since 4/21- had been hemodynamically unstable but now maybe better- pressors weaned but still on.  NO UOP so still requiring RRT.  Need to do CRRT while here.  Now have a tunneled HD cath 2. HTN/vol-  Seems like was running positive on CRRT but possibly appropriate since able to wean pressors, negative 1700 overnight  - cont to try and pull what we can 3. Anemia- hgb stable in the 7's- supportive care for now- no iron in the setting of sepsis - transfuse as needed  4. ID-  covid positive - also vanc and zosyn  5. VDRF-  O2 req has dec  6.  Elytes- K is just above 5 on all 4 k bath- no change , got phos 5/13- 2.3 today, will redose  - calcium OK    Izyk Marty A Avory Mimbs    Labs: Basic Metabolic Panel: Recent Labs  Lab 03/18/19 0220  03/18/19 1112 03/18/19 1600 03/19/19 0130  NA 137   < > 134* 135 134*  134*  K 4.0   < > 4.9 4.6 5.3*  5.3*  CL 102  --   --  99 98  98  CO2 27  --   --  24 26  26   GLUCOSE 91  --   --  141* 175*  176*  BUN 38*  --   --  31* 33*  32*  CREATININE 1.98*  --   --  1.77* 1.82*  1.81*  CALCIUM 8.5*  --   --  8.7* 9.1  8.9  PHOS 3.1  --   --  2.7 2.3*   < > = values in this interval not  displayed.   Liver Function Tests: Recent Labs  Lab 03/14/19 1206  03/18/19 0220 03/18/19 1600 03/19/19 0130  AST 29  --   --   --   --   ALT 30  --   --   --   --   ALKPHOS 144*  --   --   --   --   BILITOT 0.7  --   --   --   --   PROT 7.8  --   --   --   --   ALBUMIN 3.1*   < > 2.4* 2.6* 2.8*   < > = values in this interval not displayed.   No results for input(s): LIPASE, AMYLASE in the last 168 hours. No results for input(s): AMMONIA in the last 168 hours. CBC: Recent Labs  Lab 03/14/19 1206  03/15/19 0252  03/16/19 0153  03/17/19 0230  03/18/19 0220  03/18/19 1029 03/18/19 1112 03/19/19 0130  WBC 17.8* 17.2*  --  22.0*  --  14.7*  --  12.8*  --   --   --  15.9*  NEUTROABS 10.8*  --   --   --   --   --   --   --   --   --   --   --   HGB 8.0* 8.2*   < > 8.1*   < > 7.3*   < > 7.3*   < > 7.8* 9.5* 7.5*  HCT 25.4* 26.4*   < > 26.1*   < > 25.0*   < > 24.9*   < > 23.0* 28.0* 25.1*  MCV 98.8 98.5  --  96.7  --  100.4*  --  101.6*  --   --   --  101.6*  PLT 196 193  --  195  --  181  --  194  --   --   --  213   < > = values in this interval not displayed.   Cardiac Enzymes: No results for input(s): CKTOTAL, CKMB, CKMBINDEX, TROPONINI in the last 168 hours. CBG: Recent Labs  Lab 03/18/19 1926 03/18/19 2305 03/19/19 0316 03/19/19 0835 03/19/19 1208  GLUCAP 118* 170* 191* 196* 203*    Iron Studies: No results for input(s): IRON, TIBC, TRANSFERRIN, FERRITIN in the last 72 hours. Studies/Results: Dg Chest Port 1 View  Result Date: 03/18/2019 CLINICAL DATA:  Acute respiratory failure, known positive COVID-19 EXAM: PORTABLE CHEST 1 VIEW COMPARISON:  03/17/2019 FINDINGS: Cardiac shadow is stable. Tracheostomy tube and bilateral jugular catheters are again noted and stable. Gastric catheter extends into the stomach. Patchy infiltrates are noted throughout both lungs stable from the previous day. No acute bony abnormality is noted. IMPRESSION: Stable bilateral opacities  Tubes and lines stable from the previous day. Electronically Signed   By: Inez Catalina M.D.   On: 03/18/2019 12:12   Dg Chest Port 1 View  Result Date: 03/17/2019 CLINICAL DATA:  Hypotension EXAM: PORTABLE CHEST 1 VIEW COMPARISON:  03/17/2019 FINDINGS: The left-sided central venous catheter tip projects over the left brachiocephalic vein. The right-sided tunneled dialysis catheter tip projects over the right atrium. The enteric tube extends below the level of the left hemidiaphragm. The tracheostomy tube terminates above the carina. Diffuse bilateral hazy airspace opacities are again noted. These have improved somewhat since the prior study. There is no pneumothorax. No large pleural effusion. The cardiac silhouette is stable from prior studies. IMPRESSION: 1. Lines and tubes as above. 2. Diffuse hazy bilateral airspace opacities with some slight interval improvement when compared to prior study. Electronically Signed   By: Constance Holster M.D.   On: 03/17/2019 14:07   Medications: Infusions: .  prismasol BGK 4/2.5 300 mL/hr at 03/19/19 0437  .  prismasol BGK 4/2.5 500 mL/hr at 03/19/19 0327  . sodium chloride    . sodium chloride Stopped (03/19/19 1227)  . dexmedetomidine (PRECEDEX) IV infusion 0.4 mcg/kg/hr (03/19/19 1300)  . feeding supplement (VITAL 1.5 CAL) 1,000 mL (03/18/19 1452)  . fluconazole (DIFLUCAN) IV Stopped (03/19/19 1216)  . heparin 10,000 units/ 20 mL infusion syringe 1,000 Units/hr (03/19/19 0817)  . heparin 700 Units/hr (03/19/19 0400)  . norepinephrine (LEVOPHED) Adult infusion 4 mcg/min (03/19/19 1300)  . piperacillin-tazobactam Stopped (03/19/19 8527)  . prismasol BGK 4/2.5 1,500 mL/hr at 03/19/19 1155  . vancomycin 200 mL/hr at 03/19/19  1300    Scheduled Medications: . chlorhexidine  15 mL Mouth/Throat BID  . Chlorhexidine Gluconate Cloth  6 each Topical Daily  . docusate  100 mg Per Tube BID  . famotidine  20 mg Per Tube Daily  . feeding supplement (PRO-STAT  SUGAR FREE 64)  60 mL Per Tube BID  . free water  100 mL Per Tube Q8H  . insulin aspart  0-15 Units Subcutaneous Q4H  . insulin aspart  6 Units Subcutaneous Q4H  . insulin glargine  15 Units Subcutaneous BID  . mouth rinse  15 mL Mouth Rinse 10 times per day  . oxyCODONE  10 mg Oral Q6H  . propofol  500 mg Intravenous Once  . QUEtiapine  75 mg Per Tube BID  . sennosides  5 mL Oral BID  . sodium chloride flush  10-40 mL Intracatheter Q12H    have reviewed scheduled and prn medications.    Physical Exam: General - adult male intubated  Card - S1S2 Pulm - occ rhonchi  abd - soft /nd  Ext - trace edema   RIJ tunneled catheter placed 5/13  Due to the nature of this patient's KVTXL-21 with isolation and in keeping with efforts to prevent the spread of infection and to conserve personal protective equipment, a physical exam was not personally performed.  Patient's symptoms and exam were discussed in detail with the RN.  A chart review of other providers notes and the patient's lab work as well as review of other pertinent studies was performed.  Exam details from prior documentation were reviewed specifically and confirmed with the bedside nurse.  Location of service: [facility]    03/19/2019,1:18 PM  LOS: 24 days

## 2019-03-19 NOTE — Progress Notes (Signed)
[facility] PROGRESS NOTE    Progress Note  [name]  PJA:[id number] DOB: [birth year] DOA: 02/23/2019 PCP: Patient, No Pcp Per     Brief Narrative:   Jonathan [name] is an 48 y.o. male noninjected speaker arrived from [place] on 02/13/2019 lesion close quarters with 6 of the workers came in in respiratory distress intubated for ARDS due to COVID-19 infection started on CRRT, paralyzed and prone.  Medications: 4/22- Tocluzimab X 1 03/12/2019 on IV heparin drip. 03/07/2019, convalescent plasma 03/09/2019 paralytic stopped  Events: 02/23/2019 admitted to the ICU. 02/24/2019 started on CRRT 4/21-4/24 Proned daily 4/28 -4/30 proned daily  03/04/2019 increase in oxygen demand require paralytic for 24 hours. 03/09/2019 hematochezia from gastric aspirate  Lines and tubes: 4/21 - ETT >>5.11.2020 4/21 - RIJ CVC [facility] >> 4/22 L IJ HD Cath> 5/13 Permacath right  Antibiotics: 02/23/2019-425 2020 azithromycin and Rocephin 02/23/2019-02/24/2019 03/06/2019 Rocephin 5/11 Vancomycin 5/12 Zosyn 5/12 Fluconazole for candida in tracheal aspirate and recent Actemra   Assessment/Plan:   Acute respiratory failure with hypoxia/COVID-19/  ARDS (adult respiratory distress syndrome) ([place]) Continue vent management per PCCM on ARDS HD protocol. Extubated on 03/15/2019 and was stable throughout the day.  Overnight became in respiratory distress had to be placed on BiPAP. Re-Intubated on 03/16/2019 and had tracheostomy placed Currently on intravenous antibiotics (vancomycin and zosyn) Today, patient noted to have increasing oxygen requirements.  Echocardiogram performed shows right-sided heart failure.  He has been empirically started on heparin for concerns for underlying pulmonary embolus.  Shock  Patient has been hypotensive requiring levophed.  Shock may be related to right ventricular overload and pulmonary hypertension. He is afebrile.  Continue to wean off pressors as tolerated    ATN: Continue on CRRT per Nephrology, urine output remains poor Underwent tunneled HD catheter on 03/17/2019. He is currently on 100 cc of free water per tube.  Anemia due to chronic illness and possibly blood loss bleed: Hemoglobin ranging between 7-9. No signs of ongoing blood loss. Continue to follow hemoglobin closely   Sinus tachycardia: tachycardia significantly improved, continue to use IV metoprolol PRN.  Pressure injury of skin on the nose:  Obese:   DVT prophylaxis: Heparin Family Communication: Wife Energy manager number [phone]. Updated by CCM Disposition Plan/Barrier to D/C: Keep in ICU. Code Status:     Code Status Orders  (From admission, onward)         Start     Ordered   03/02/19 1658  Limited resuscitation (code)  Continuous    Question Answer Comment  In the event of cardiac or respiratory ARREST: Initiate Code Blue, Call Rapid Response No   In the event of cardiac or respiratory ARREST: Perform CPR No   In the event of cardiac or respiratory ARREST: Perform Intubation/Mechanical Ventilation Yes   In the event of cardiac or respiratory ARREST: Use NIPPV/BiPAp only if indicated Yes   In the event of cardiac or respiratory ARREST: Administer ACLS medications if indicated Yes   In the event of cardiac or respiratory ARREST: Perform Defibrillation or Cardioversion if indicated Yes      03/02/19 1657        Code Status History    Date Active Date Inactive Code Status Order ID Comments User Context   02/23/2019 1051 03/02/2019 1657 Full Code [id number]  Kipp Brood, MD ED        IV Access:    Permacath right 5/13   Procedures and diagnostic studies:   Dg Chest [facility]  1 View  Result Date: 03/18/2019 CLINICAL DATA:  Acute respiratory failure, known positive COVID-19 EXAM: PORTABLE CHEST 1 VIEW COMPARISON:  03/17/2019 FINDINGS: Cardiac shadow is stable. Tracheostomy tube and bilateral jugular catheters are again noted and  stable. Gastric catheter extends into the stomach. Patchy infiltrates are noted throughout both lungs stable from the previous day. No acute bony abnormality is noted. IMPRESSION: Stable bilateral opacities Tubes and lines stable from the previous day. Electronically Signed   By: Inez Catalina M.D.   On: 03/18/2019 12:12     Medical Consultants:   PCCM Nephrology  Anti-Infectives:   Azithromycin 4/21 >>4/25 Ceftriaxone 4/22 >>4/25 Hydroxychloroquine 4/21 > 4/22 Vancomycin 03/14/2019> Zosyn 03/16/2019>  Subjective:    Jonathan [name] is somnolent but awake, complains of some abdominal discomfort and feels that he needs to move his bowels  Objective:    Vitals:   03/19/19 1700 03/19/19 1757 03/19/19 1800 03/19/19 1830  BP: 107/79 97/74 96/69  103/74  Pulse: (!) 107 (!) 107 (!) 107 (!) 108  Resp: (!) 27 (!) 26 (!) 28 (!) 26  Temp:      TempSrc:      SpO2: 100% 100% 100% 100%  Weight:      Height:        Intake/Output Summary (Last 24 hours) at 03/19/2019 1859 Last data filed at 03/19/2019 1800 Gross per 24 hour  Intake 2694.35 ml  Output 3919 ml  Net -1224.65 ml   Filed Weights   03/16/19 0500 03/18/19 0256 03/19/19 0415  Weight: 64.2 kg 65.7 kg 66.5 kg    Exam: General exam: no distress Respiratory system: Clear to auscultation. Respiratory effort normal. Cardiovascular system:RRR. No murmurs, rubs, gallops. Gastrointestinal system: Abdomen is nondistended, soft and nontender. No organomegaly or masses felt. Normal bowel sounds heard. Central nervous system: No focal neurological deficits. Extremities: No C/C/E, +pedal pulses Skin: No rashes, lesions or ulcers Psychiatry: limited due to sedation      Data Reviewed:    Labs: Basic Metabolic Panel: Recent Labs  Lab 03/15/19 0252  03/16/19 0153  03/17/19 0230  03/17/19 1700 03/18/19 0220  03/18/19 1029 03/18/19 1112 03/18/19 1600 03/19/19 0130 03/19/19 1632  NA  --    < > 134*  135   < > 134*    < > 135 137   < > 135 134* 135 134*  134* 136  K  --    < > 4.8  4.7   < > 4.5   < > 4.2 4.0   < > 4.8 4.9 4.6 5.3*  5.3* 4.6  CL  --    < > 100  101   < > 100  --  102 102  --   --   --  99 98  98 98  CO2  --    < > 22  22   < > 24  --  24 27  --   --   --  24 26  26 25   GLUCOSE  --    < > 134*  135*   < > 204*  --  194* 91  --   --   --  141* 175*  176* 170*  BUN  --    < > 37*  38*   < > 42*  --  45* 38*  --   --   --  31* 33*  32* 34*  CREATININE  --    < > 2.28*  2.24*   < >  2.18*  --  2.30* 1.98*  --   --   --  1.77* 1.82*  1.81* 1.81*  CALCIUM  --    < > 9.3  9.4   < > 8.6*  --  8.1* 8.5*  --   --   --  8.7* 9.1  8.9 9.0  MG 3.1*  --  3.1*  --  3.0*  --   --  2.9*  --   --   --   --  2.8*  --   PHOS  --    < > 2.7  2.7   < > 2.3*  --  4.2 3.1  --   --   --  2.7 2.3* 2.0*   < > = values in this interval not displayed.   GFR Estimated Creatinine Clearance: 43.4 mL/min (A) (by C-G formula based on SCr of 1.81 mg/dL (H)). Liver Function Tests: Recent Labs  Lab 03/14/19 1206  03/17/19 1700 03/18/19 0220 03/18/19 1600 03/19/19 0130 03/19/19 1632  AST 29  --   --   --   --   --   --   ALT 30  --   --   --   --   --   --   ALKPHOS 144*  --   --   --   --   --   --   BILITOT 0.7  --   --   --   --   --   --   PROT 7.8  --   --   --   --   --   --   ALBUMIN 3.1*   < > 2.5* 2.4* 2.6* 2.8* 2.9*   < > = values in this interval not displayed.   No results for input(s): LIPASE, AMYLASE in the last 168 hours. No results for input(s): AMMONIA in the last 168 hours. Coagulation profile Recent Labs  Lab 03/14/19 1206 03/16/19 0845  INR 1.2 1.2   COVID-19 Labs  No results for input(s): DDIMER, FERRITIN, LDH, CRP in the last 72 hours.  Lab Results  Component Value Date   SARSCOV2NAA POSITIVE (A) 02/23/2019    CBC: Recent Labs  Lab 03/14/19 1206 03/15/19 0252  03/16/19 0153  03/17/19 0230  03/18/19 0220 03/18/19 0850 03/18/19 1029 03/18/19 1112 03/19/19  0130  WBC 17.8* 17.2*  --  22.0*  --  14.7*  --  12.8*  --   --   --  15.9*  NEUTROABS 10.8*  --   --   --   --   --   --   --   --   --   --   --   HGB 8.0* 8.2*   < > 8.1*   < > 7.3*   < > 7.3* 7.5* 7.8* 9.5* 7.5*  HCT 25.4* 26.4*   < > 26.1*   < > 25.0*   < > 24.9* 22.0* 23.0* 28.0* 25.1*  MCV 98.8 98.5  --  96.7  --  100.4*  --  101.6*  --   --   --  101.6*  PLT 196 193  --  195  --  181  --  194  --   --   --  213   < > = values in this interval not displayed.   Cardiac Enzymes: No results for input(s): CKTOTAL, CKMB, CKMBINDEX, TROPONINI in the last 168 hours. BNP (last 3 results) No results for input(s): PROBNP in the last  8760 hours. CBG: Recent Labs  Lab 03/18/19 2305 03/19/19 0316 03/19/19 0835 03/19/19 1208 03/19/19 1629  GLUCAP 170* 191* 196* 203* 163*   D-Dimer: No results for input(s): DDIMER in the last 72 hours. Hgb A1c: No results for input(s): HGBA1C in the last 72 hours. Lipid Profile: No results for input(s): CHOL, HDL, LDLCALC, TRIG, CHOLHDL, LDLDIRECT in the last 72 hours. Thyroid function studies: No results for input(s): TSH, T4TOTAL, T3FREE, THYROIDAB in the last 72 hours.  Invalid input(s): FREET3 Anemia work up: No results for input(s): VITAMINB12, FOLATE, FERRITIN, TIBC, IRON, RETICCTPCT in the last 72 hours. Sepsis Labs: Recent Labs  Lab 03/14/19 1206 03/14/19 1207 03/14/19 1643  03/16/19 0153 03/17/19 0230 03/18/19 0220 03/19/19 0130  PROCALCITON 3.52  --   --   --   --   --   --   --   WBC 17.8*  --   --    < > 22.0* 14.7* 12.8* 15.9*  LATICACIDVEN  --  1.2 1.2  --   --   --   --   --    < > = values in this interval not displayed.   Microbiology Recent Results (from the past 240 hour(s))  Culture, blood (Routine X 2) w Reflex to ID Panel     Status: None   Collection Time: 03/14/19 11:45 AM  Result Value Ref Range Status   Specimen Description   Final    BLOOD RIGHT ANTECUBITAL Performed at [facility] [address] [place], [place] [address]    Special Requests   Final    BOTTLES DRAWN AEROBIC ONLY Blood Culture adequate volume Performed at [facility] [address], [place], [place] [address]    Culture   Final    NO GROWTH 5 DAYS Performed at [facility], [place] [address] [place], [place] [address]    Report Status 03/19/2019 FINAL  Final  Culture, blood (Routine X 2) w Reflex to ID Panel     Status: None   Collection Time: 03/14/19 11:50 AM  Result Value Ref Range Status   Specimen Description   Final    BLOOD RIGHT HAND Performed at [facility] [address], [place], [place] [address]    Special Requests   Final    BOTTLES DRAWN AEROBIC ONLY Blood Culture adequate volume Performed at [facility] [address], [place], [place] [address]    Culture   Final    NO GROWTH 5 DAYS Performed at [facility], [place] [address], [place], [place] [address]    Report Status 03/19/2019 FINAL  Final     Medications:   . chlorhexidine  15 mL Mouth/Throat BID  . Chlorhexidine Gluconate Cloth  6 each Topical Daily  . docusate  100 mg Per Tube BID  . famotidine  20 mg Per Tube Daily  . feeding supplement (PRO-STAT SUGAR FREE 64)  60 mL Per Tube BID  . free water  100 mL Per Tube Q8H  . insulin aspart  0-15 Units Subcutaneous Q4H  . insulin aspart  6 Units Subcutaneous Q4H  . insulin glargine  15 Units Subcutaneous BID  . mouth rinse  15 mL Mouth Rinse 10 times per day  . oxyCODONE  10 mg Oral Q6H  . propofol  500 mg Intravenous Once  . QUEtiapine  75 mg Per Tube BID  . sennosides  5 mL Oral BID  . sodium  chloride flush  10-40 mL Intracatheter Q12H   Continuous Infusions: .  prismasol BGK 4/2.5 300 mL/hr at 03/19/19 0437  .  prismasol BGK 4/2.5 500 mL/hr at 03/19/19 1353  . sodium chloride    . sodium chloride Stopped (03/19/19 1612)  . dexmedetomidine (PRECEDEX) IV infusion Stopped (03/19/19 1751)  .  feeding supplement (VITAL 1.5 CAL) 1,000 mL (03/18/19 1452)  . fluconazole (DIFLUCAN) IV Stopped (03/19/19 1216)  . heparin 10,000 units/ 20 mL infusion syringe 1,000 Units/hr (03/19/19 0817)  . heparin 700 Units/hr (03/19/19 0400)  . norepinephrine (LEVOPHED) Adult infusion 3 mcg/min (03/19/19 1800)  . piperacillin-tazobactam Stopped (03/19/19 1558)  . prismasol BGK 4/2.5 1,500 mL/hr at 03/19/19 1858  . sodium phosphate  Dextrose 5% IVPB 43 mL/hr at 03/19/19 1800  . vancomycin Stopped (03/19/19 1327)     LOS: 24 days   Kathie Dike, MD  [facility]  03/19/2019, 6:59 PM   Critical care: 11mins

## 2019-03-19 NOTE — Progress Notes (Signed)
Used interpreter ipad with patient to to do assessment. Patient was able to follow commands and nod appropriately but became tired easily.   Lucius Conn, RN

## 2019-03-19 NOTE — Progress Notes (Signed)
Facilitated family video phone call and update with interpreter and [place].    Lucius Conn, RN

## 2019-03-19 NOTE — Progress Notes (Signed)
[facility] PCCM  Daughter updated by phone with interpreter.  Roselie Awkward, MD [facility] P[facility]M Pager: [phone] Cell: [phone] If no response, call [phone]

## 2019-03-19 NOTE — Progress Notes (Signed)
Assisted tele visit to patient with family member.  Ieesha Abbasi William, RN   

## 2019-03-20 LAB — RENAL FUNCTION PANEL
Albumin: 3.5 g/dL (ref 3.5–5.0)
Albumin: 2.9 g/dL — ABNORMAL LOW (ref 3.5–5.0)
Anion gap: 13 (ref 5–15)
Anion gap: 13 (ref 5–15)
BUN: 36 mg/dL — ABNORMAL HIGH (ref 6–20)
BUN: 39 mg/dL — ABNORMAL HIGH (ref 6–20)
CO2: 27 mmol/L (ref 22–32)
CO2: 23 mmol/L (ref 22–32)
Calcium: 10.1 mg/dL (ref 8.9–10.3)
Calcium: 9.4 mg/dL (ref 8.9–10.3)
Chloride: 96 mmol/L — ABNORMAL LOW (ref 98–111)
Chloride: 98 mmol/L (ref 98–111)
Creatinine, Ser: 1.99 mg/dL — ABNORMAL HIGH (ref 0.61–1.24)
Creatinine, Ser: 2.13 mg/dL — ABNORMAL HIGH (ref 0.61–1.24)
GFR calc Af Amer: 45 mL/min — ABNORMAL LOW (ref >60)
GFR calc Af Amer: 41 mL/min — ABNORMAL LOW (ref >60)
GFR calc non Af Amer: 39 mL/min — ABNORMAL LOW (ref >60)
GFR calc non Af Amer: 36 mL/min — ABNORMAL LOW (ref >60)
Glucose, Bld: 120 mg/dL — ABNORMAL HIGH (ref 70–99)
Glucose, Bld: 215 mg/dL — ABNORMAL HIGH (ref 70–99)
Phosphorus: 3.6 mg/dL (ref 2.5–4.6)
Phosphorus: 3.4 mg/dL (ref 2.5–4.6)
Potassium: 4.9 mmol/L (ref 3.5–5.1)
Potassium: 5.1 mmol/L (ref 3.5–5.1)
Sodium: 136 mmol/L (ref 135–145)
Sodium: 134 mmol/L — ABNORMAL LOW (ref 135–145)

## 2019-03-20 LAB — GLUCOSE, CAPILLARY
Glucose-Capillary: 142 mg/dL — ABNORMAL HIGH (ref 70–99)
Glucose-Capillary: 144 mg/dL — ABNORMAL HIGH (ref 70–99)
Glucose-Capillary: 179 mg/dL — ABNORMAL HIGH (ref 70–99)
Glucose-Capillary: 196 mg/dL — ABNORMAL HIGH (ref 70–99)
Glucose-Capillary: 208 mg/dL — ABNORMAL HIGH (ref 70–99)
Glucose-Capillary: 209 mg/dL — ABNORMAL HIGH (ref 70–99)

## 2019-03-20 LAB — APTT: aPTT: 179 seconds (ref 24–36)

## 2019-03-20 LAB — CBC
HCT: 29.8 % — ABNORMAL LOW (ref 39.0–52.0)
Hemoglobin: 8.9 g/dL — ABNORMAL LOW (ref 13.0–17.0)
MCH: 30.4 pg (ref 26.0–34.0)
MCHC: 29.9 g/dL — ABNORMAL LOW (ref 30.0–36.0)
MCV: 101.7 fL — ABNORMAL HIGH (ref 80.0–100.0)
Platelets: 287 10*3/uL (ref 150–400)
RBC: 2.93 MIL/uL — ABNORMAL LOW (ref 4.22–5.81)
RDW: 18.4 % — ABNORMAL HIGH (ref 11.5–15.5)
WBC: 19.7 10*3/uL — ABNORMAL HIGH (ref 4.0–10.5)
nRBC: 0.9 % — ABNORMAL HIGH (ref 0.0–0.2)

## 2019-03-20 LAB — HEPARIN LEVEL (UNFRACTIONATED)
Heparin Unfractionated: 1.02 IU/mL — ABNORMAL HIGH (ref 0.30–0.70)
Heparin Unfractionated: 0.46 IU/mL (ref 0.30–0.70)

## 2019-03-20 LAB — MAGNESIUM: Magnesium: 3.4 mg/dL — ABNORMAL HIGH (ref 1.7–2.4)

## 2019-03-20 MED ORDER — SODIUM CHLORIDE 0.9 % IV SOLN
INTRAVENOUS | Status: AC
  Administered 2019-03-20: 12:00:00 via INTRAVENOUS

## 2019-03-20 MED ORDER — OXYCODONE HCL 5 MG PO TABS
5.0000 mg | ORAL_TABLET | Freq: Four times a day (QID) | ORAL | Status: DC
  Administered 2019-03-20 – 2019-03-22 (×8): 5 mg via ORAL
  Filled 2019-03-20 (×8): qty 1

## 2019-03-20 MED ORDER — ONDANSETRON HCL 4 MG/2ML IJ SOLN
4.0000 mg | Freq: Four times a day (QID) | INTRAMUSCULAR | Status: DC | PRN
  Administered 2019-03-20 – 2019-04-05 (×3): 4 mg via INTRAVENOUS
  Filled 2019-03-20 (×3): qty 2

## 2019-03-20 MED ORDER — HEPARIN (PORCINE) 25000 UT/250ML-% IV SOLN
600.0000 [IU]/h | INTRAVENOUS | Status: DC
  Administered 2019-03-21 – 2019-03-25 (×3): 600 [IU]/h via INTRAVENOUS
  Filled 2019-03-20 (×3): qty 250

## 2019-03-20 NOTE — Progress Notes (Signed)
NAME:  Jonathan [name], MRN:  [id number], DOB:  1971-03-06, LOS: 75 ADMISSION DATE:  02/23/2019, CONSULTATION DATE:  02/23/2019 REFERRING MD:  Regenia Skeeter, CHIEF COMPLAINT:  Dyspnea   Brief History   48 y/o male admitted on February 23, 2019 with ARDS due to COVID-19.  He has had severe ARDS requiring prone positioning for many days, developed acute kidney injury requiring continuous renal replacement therapy.    Past Medical History  DM2 HTN  Significant Hospital Events   4/21 admitted and immediately proned on inverse ratio PC ventilation with high PEEP to maintain saturation >85%, Started on NO at 40ppm. 4/22- Tocluzimab X 1 4/22- started CVVHD 4/21-4/24 Proned daily 4/28 -4/30 proned daily , tapered NO to off  4/30 -late evening with increased O2 demands . No paralytics required x 24hr . No vent dysyncrony . Calm on Fent /Versed. Off precedex   5/1 - Remains on CRRT , no acute issues overnight --4.8 L I/O Balance since admit . Continues to Prone on/off 16 on and 8 off . -CXR stable diffuse bilateral infiltrates (slightly improved aeration ) . , Vent .80Fio2 .  5/3 convalescent plasma  Biomarkes improving but severe ARDS persists ad D-dimer still very high (13) without improvement. On IV Heparin gtt. Growing MSSA. SEdation changed to dilaudid gtt. Continues prone 16h and 8h syupine. Currently supine at 80% fio2, peep 18. On TF. STARTED NIMBEX  5/4-5/5-able to stay in the supine position, able to discontinue paralysis and maintain FiO2 0.50, PEEP 8-10.  5/5 hematochezia, bloody gastric aspirate  5/8 followed commands  5/11 extubated  5/12 re-intubated, tracheostomy  5/13 perm cath  Consults:  PCCM 4/21  Procedures:  4/21 - ETT 7.5 >> 5/12 5/12 Trach >>  4/21 - RIJ CVC [facility] >> 4/22 L IJ HD Cath>   Significant Diagnostic Tests:  POC U/S 4/21: Bilateral interstitial pattern with bilateral dense consolidation at bases. Echo shows low-normal LVSF. RV  moderately dilated with septal shift.  5/14 Echo> LVEF > 65%, RV dilated, decreased RV function (moderate) with RVSP 60  Micro Data:  Blood cultures -PND SARS-CoV2 02/23/2019 - - POSITIVE HIV 4/21 -neg Quant gold 4/2 1- neg .....................Marland Kitchen resp 4/29 >>rare MSSA and yeast  5/10 blood > neg   Anti-covid RX and antimicrobial RX  Azithromycin 4/21 >>4/25 Ceftriaxone 4/22 >>4/25 Actemra 4/22 Hydroxychloroquine 4/21 > 4/22 ........................... Ceftriaxone 5/2 >>5/10 Cefepime 5/10>>> 5/12 Zosyn 5/12 >>  Vancomycin 5/10>>>  Interim history/subjective:   Norepinephrine weaned off over the last 24 hours PEEP, FiO2 needs have improved.  Remains on pressure control ventilation Tolerating CVVH  Objective   Blood pressure 93/69, pulse (!) 119, temperature 99.3 F (37.4 C), temperature source Axillary, resp. rate (!) 42, height 5\' 5"  (1.651 m), weight 58 kg, SpO2 100 %.    Vent Mode: PCV FiO2 (%):  [40 %] 40 % Set Rate:  [20 bmp] 20 bmp PEEP:  [5 cmH20] 5 cmH20 Plateau Pressure:  [20 cmH20-28 cmH20] 28 cmH20   Intake/Output Summary (Last 24 hours) at 03/20/2019 1336 Last data filed at 03/20/2019 1200 Gross per 24 hour  Intake 2569.85 ml  Output 3201 ml  Net -631.15 ml   Filed Weights   03/18/19 0256 03/19/19 0415 03/20/19 0500  Weight: 65.7 kg 66.5 kg 58 kg    Examination:  General: Laying in bed, comfortable but tachypneic HENT: Trach in good position, no secretions, oropharynx clear PULM: Distant but clear, ventilator noises, few crackles CV: Regular, no murmur GI: Soft, nondistended,  positive bowel sounds MSK: No deformities Neuro: Awake, interacting, follows commands, nods to questions     Resolved Hospital Problem list     Assessment & Plan:  Worsening hypoxemia despite minimal change in CXR: pulmonary embolism? Echo showed RV overload, pulmonary HTN, so worrisome for PE Heparin infusion ordered Continue slow volume removal with CVVH to  unload right heart pressures  Acute respiratory failure with hypoxemia, recovering ARDS from COVID 19 Suspected aspiration pneumonitis versus pneumonia 5/12 (reintubated) Continue to wean mechanical ventilation as able.  Remains tachypneic with an increased work of breathing.  Gas exchange is improved. Continue PCV mode, try PS as he can tolerate Tracheostomy care  Acute renal failure.  Low likelihood for renal recovery CVVHD as ordered  Need for sedation for MV Precedex has been weaned off Continue Dilaudid as needed.  He is having periods of increased work of breathing, tachypnea that have responded to low-dose Dilaudid Slowly wean scheduled enteral narcotics Continue Seroquel Benzodiazepines weaned off  Constipation Miralax   Best practice:  Diet: tube feeding Pain/Anxiety/Delirium protocol (if indicated): precedex, RASS goal 0 to -1 VAP protocol (if indicated): yes DVT prophylaxis: heparin infusion GI prophylaxis: PPI Glucose control: SSI Mobility: none baseline Code Status: limited code, no CPR Family Communication: wife 5/15 Disposition: remain in ICU  Labs   CBC: Recent Labs  Lab 03/14/19 1206  03/16/19 0153  03/17/19 0230  03/18/19 0220 03/18/19 0850 03/18/19 1029 03/18/19 1112 03/19/19 0130 03/20/19 0600  WBC 17.8*   < > 22.0*  --  14.7*  --  12.8*  --   --   --  15.9* 19.7*  NEUTROABS 10.8*  --   --   --   --   --   --   --   --   --   --   --   HGB 8.0*   < > 8.1*   < > 7.3*   < > 7.3* 7.5* 7.8* 9.5* 7.5* 8.9*  HCT 25.4*   < > 26.1*   < > 25.0*   < > 24.9* 22.0* 23.0* 28.0* 25.1* 29.8*  MCV 98.8   < > 96.7  --  100.4*  --  101.6*  --   --   --  101.6* 101.7*  PLT 196   < > 195  --  181  --  194  --   --   --  213 287   < > = values in this interval not displayed.    Basic Metabolic Panel: Recent Labs  Lab 03/16/19 0153  03/17/19 0230  03/18/19 0220  03/18/19 1112 03/18/19 1600 03/19/19 0130 03/19/19 1632 03/20/19 0600  NA 134*  135   < >  134*   < > 137   < > 134* 135 134*  134* 136 136  K 4.8  4.7   < > 4.5   < > 4.0   < > 4.9 4.6 5.3*  5.3* 4.6 4.9  CL 100  101   < > 100   < > 102  --   --  99 98  98 98 96*  CO2 22  22   < > 24   < > 27  --   --  24 26  26 25 27   GLUCOSE 134*  135*   < > 204*   < > 91  --   --  141* 175*  176* 170* 120*  BUN 37*  38*   < > 42*   < >  38*  --   --  31* 33*  32* 34* 36*  CREATININE 2.28*  2.24*   < > 2.18*   < > 1.98*  --   --  1.77* 1.82*  1.81* 1.81* 1.99*  CALCIUM 9.3  9.4   < > 8.6*   < > 8.5*  --   --  8.7* 9.1  8.9 9.0 10.1  MG 3.1*  --  3.0*  --  2.9*  --   --   --  2.8*  --  3.4*  PHOS 2.7  2.7   < > 2.3*   < > 3.1  --   --  2.7 2.3* 2.0* 3.6   < > = values in this interval not displayed.   GFR: Estimated Creatinine Clearance: 37.2 mL/min (A) (by C-G formula based on SCr of 1.99 mg/dL (H)). Recent Labs  Lab 03/14/19 1206 03/14/19 1207 03/14/19 1643  03/17/19 0230 03/18/19 0220 03/19/19 0130 03/20/19 0600  PROCALCITON 3.52  --   --   --   --   --   --   --   WBC 17.8*  --   --    < > 14.7* 12.8* 15.9* 19.7*  LATICACIDVEN  --  1.2 1.2  --   --   --   --   --    < > = values in this interval not displayed.    Liver Function Tests: Recent Labs  Lab 03/14/19 1206  03/18/19 0220 03/18/19 1600 03/19/19 0130 03/19/19 1632 03/20/19 0600  AST 29  --   --   --   --   --   --   ALT 30  --   --   --   --   --   --   ALKPHOS 144*  --   --   --   --   --   --   BILITOT 0.7  --   --   --   --   --   --   PROT 7.8  --   --   --   --   --   --   ALBUMIN 3.1*   < > 2.4* 2.6* 2.8* 2.9* 3.5   < > = values in this interval not displayed.   No results for input(s): LIPASE, AMYLASE in the last 168 hours. No results for input(s): AMMONIA in the last 168 hours.  ABG    Component Value Date/Time   PHART 7.381 03/18/2019 1112   PCO2ART 42.1 03/18/2019 1112   PO2ART 50.0 (L) 03/18/2019 1112   HCO3 25.1 03/18/2019 1112   TCO2 26 03/18/2019 1112   ACIDBASEDEF 1.0  03/16/2019 0450   O2SAT 85.0 03/18/2019 1112     Coagulation Profile: Recent Labs  Lab 03/14/19 1206 03/16/19 0845  INR 1.2 1.2    Cardiac Enzymes: No results for input(s): CKTOTAL, CKMB, CKMBINDEX, TROPONINI in the last 168 hours.  HbA1C: No results found for: HGBA1C  CBG: Recent Labs  Lab 03/19/19 2043 03/20/19 0002 03/20/19 0317 03/20/19 0742 03/20/19 1154  GLUCAP 190* 208* 179* 144* 196*     Critical care time: 31 minutes     Baltazar Apo, MD, PhD 03/20/2019, 1:44 PM [facility] [phone] or if no answer [phone]

## 2019-03-20 NOTE — Progress Notes (Addendum)
[facility] PROGRESS NOTE    Progress Note  Jonathan [name]  OFB:[id number] DOB: 1971/07/01 DOA: 02/23/2019 PCP: Patient, No Pcp Per     Brief Narrative:   Jonathan [name] is an 48 y.o. male noninjected speaker arrived from [place] on 02/13/2019 lesion close quarters with 6 of the workers came in in respiratory distress intubated for ARDS due to COVID-19 infection started on CRRT, paralyzed and prone.  Medications: 4/22- Tocluzimab X 1 03/12/2019 on IV heparin drip. 03/07/2019, convalescent plasma 03/09/2019 paralytic stopped  Events: 02/23/2019 admitted to the ICU. 02/24/2019 started on CRRT 4/21-4/24 Proned daily 4/28 -4/30 proned daily  03/04/2019 increase in oxygen demand require paralytic for 24 hours. 03/09/2019 hematochezia from gastric aspirate  Lines and tubes: 4/21 - ETT >>5.11.2020 4/21 - RIJ CVC [facility] >> 4/22 L IJ HD Cath> 5/13 Permacath right  Antibiotics: 02/23/2019-425 2020 azithromycin and Rocephin 02/23/2019-02/24/2019 03/06/2019 Rocephin 5/11 Vancomycin 5/12 Zosyn 5/12 Fluconazole for candida in tracheal aspirate and recent Actemra   Assessment/Plan:   Acute respiratory failure with hypoxia/COVID-19/  ARDS (adult respiratory distress syndrome) ([place]) Continue vent management per PCCM on ARDS HD protocol. Extubated on 03/15/2019 and was stable throughout the day.  The following night developed respiratory distress had to be placed on BiPAP. He also had vomiting at this time and likely aspirated Re-Intubated on 03/16/2019 and had tracheostomy placed Currently on intravenous antibiotics (vancomycin and zosyn) for pneumonia  Echocardiogram performed shows right-sided heart failure.  He has been empirically started on heparin for concerns for underlying pulmonary embolus.  Shock  Patient had been hypotensive requiring levophed.  This has since been weaned off and blood pressures have stabilized   ATN: Continue on CRRT per Nephrology, urine output  remains poor Underwent tunneled HD catheter on 03/17/2019. He is currently on 100 cc of free water per tube.  Anemia due to chronic illness and possibly blood loss bleed: Hemoglobin ranging between 7-9. No signs of ongoing blood loss. Continue to follow hemoglobin closely   Sinus tachycardia: tachycardia significantly improved, continue to use IV metoprolol PRN.  Leukocytosis: Unclear etiology, no obvious signs of infection. If he spikes any temps, then will send cultures and consider antibiotics.  Pressure injury of skin on the nose:  Obese:   DVT prophylaxis: Heparin Family Communication: Wife Energy manager number [phone]. Updated by [facility] 5/15 Disposition Plan/Barrier to D/C: Keep in ICU. Code Status:     Code Status Orders  (From admission, onward)         Start     Ordered   03/02/19 1658  Limited resuscitation (code)  Continuous    Question Answer Comment  In the event of cardiac or respiratory ARREST: Initiate Code Blue, Call Rapid Response No   In the event of cardiac or respiratory ARREST: Perform CPR No   In the event of cardiac or respiratory ARREST: Perform Intubation/Mechanical Ventilation Yes   In the event of cardiac or respiratory ARREST: Use NIPPV/BiPAp only if indicated Yes   In the event of cardiac or respiratory ARREST: Administer ACLS medications if indicated Yes   In the event of cardiac or respiratory ARREST: Perform Defibrillation or Cardioversion if indicated Yes      03/02/19 1657        Code Status History    Date Active Date Inactive Code Status Order ID Comments User Context   02/23/2019 1051 03/02/2019 1657 Full Code [id number]  Kipp Brood, MD ED        IV Access:  Permacath right 5/13   Procedures and diagnostic studies:   Dg Abd Portable 1v  Result Date: 03/20/2019 CLINICAL DATA:  NG tube placement. EXAM: PORTABLE ABDOMEN - 1 VIEW COMPARISON:  Radiograph 03/09/2019 FINDINGS: Tip and side port of the  enteric tube below the diaphragm in the stomach. No small bowel dilatation to suggest obstruction. Minimal gaseous distension of transverse colon. IMPRESSION: Tip and side port of the enteric tube below the diaphragm in the stomach. Electronically Signed   By: Keith Rake M.D.   On: 03/20/2019 01:01     Medical Consultants:   PCCM Nephrology  Anti-Infectives:   Azithromycin 4/21 >>4/25 Ceftriaxone 4/22 >>4/25 Hydroxychloroquine 4/21 > 4/22 Vancomycin 03/14/2019> Zosyn 03/16/2019>  Subjective:    Jonathan [name] is somnolent but awake, complains of some abdominal discomfort and feels that he needs to move his bowels  Objective:    Vitals:   03/20/19 1500 03/20/19 1530 03/20/19 1600 03/20/19 1605  BP: 104/87 104/85 98/80   Pulse: (!) 118 (!) 115 (!) 117   Resp: (!) 27 (!) 22 (!) 33   Temp:   99.3 F (37.4 C)   TempSrc: Axillary  Axillary Oral  SpO2: 100% 100% 100%   Weight:      Height:        Intake/Output Summary (Last 24 hours) at 03/20/2019 1705 Last data filed at 03/20/2019 1600 Gross per 24 hour  Intake 3006.85 ml  Output 3099 ml  Net -92.15 ml   Filed Weights   03/18/19 0256 03/19/19 0415 03/20/19 0500  Weight: 65.7 kg 66.5 kg 58 kg    Exam: General exam: Alert, awake, oriented x 3 Respiratory system: Clear to auscultation. Respiratory effort normal. Dialysis catheter in right chest Cardiovascular system:RRR. No murmurs, rubs, gallops. Gastrointestinal system: Abdomen is nondistended, soft and nontender. No organomegaly or masses felt. Normal bowel sounds heard. Central nervous system: No focal neurological deficits. Extremities: No C/C/E, +pedal pulses Skin: No rashes, lesions or ulcers Psychiatry: limited due to medical condition.    Data Reviewed:    Labs: Basic Metabolic Panel: Recent Labs  Lab 03/16/19 0153  03/17/19 0230  03/18/19 0220  03/18/19 1112 03/18/19 1600 03/19/19 0130 03/19/19 1632 03/20/19 0600  NA 134*  135   <  > 134*   < > 137   < > 134* 135 134*  134* 136 136  K 4.8  4.7   < > 4.5   < > 4.0   < > 4.9 4.6 5.3*  5.3* 4.6 4.9  CL 100  101   < > 100   < > 102  --   --  99 98  98 98 96*  CO2 22  22   < > 24   < > 27  --   --  24 26  26 25 27   GLUCOSE 134*  135*   < > 204*   < > 91  --   --  141* 175*  176* 170* 120*  BUN 37*  38*   < > 42*   < > 38*  --   --  31* 33*  32* 34* 36*  CREATININE 2.28*  2.24*   < > 2.18*   < > 1.98*  --   --  1.77* 1.82*  1.81* 1.81* 1.99*  CALCIUM 9.3  9.4   < > 8.6*   < > 8.5*  --   --  8.7* 9.1  8.9 9.0 10.1  MG 3.1*  --  3.0*  --  2.9*  --   --   --  2.8*  --  3.4*  PHOS 2.7  2.7   < > 2.3*   < > 3.1  --   --  2.7 2.3* 2.0* 3.6   < > = values in this interval not displayed.   GFR Estimated Creatinine Clearance: 37.2 mL/min (A) (by C-G formula based on SCr of 1.99 mg/dL (H)). Liver Function Tests: Recent Labs  Lab 03/14/19 1206  03/18/19 0220 03/18/19 1600 03/19/19 0130 03/19/19 1632 03/20/19 0600  AST 29  --   --   --   --   --   --   ALT 30  --   --   --   --   --   --   ALKPHOS 144*  --   --   --   --   --   --   BILITOT 0.7  --   --   --   --   --   --   PROT 7.8  --   --   --   --   --   --   ALBUMIN 3.1*   < > 2.4* 2.6* 2.8* 2.9* 3.5   < > = values in this interval not displayed.   No results for input(s): LIPASE, AMYLASE in the last 168 hours. No results for input(s): AMMONIA in the last 168 hours. Coagulation profile Recent Labs  Lab 03/14/19 1206 03/16/19 0845  INR 1.2 1.2   COVID-19 Labs  No results for input(s): DDIMER, FERRITIN, LDH, CRP in the last 72 hours.  Lab Results  Component Value Date   SARSCOV2NAA POSITIVE (A) 02/23/2019    CBC: Recent Labs  Lab 03/14/19 1206  03/16/19 0153  03/17/19 0230  03/18/19 0220 03/18/19 0850 03/18/19 1029 03/18/19 1112 03/19/19 0130 03/20/19 0600  WBC 17.8*   < > 22.0*  --  14.7*  --  12.8*  --   --   --  15.9* 19.7*  NEUTROABS 10.8*  --   --   --   --   --   --   --    --   --   --   --   HGB 8.0*   < > 8.1*   < > 7.3*   < > 7.3* 7.5* 7.8* 9.5* 7.5* 8.9*  HCT 25.4*   < > 26.1*   < > 25.0*   < > 24.9* 22.0* 23.0* 28.0* 25.1* 29.8*  MCV 98.8   < > 96.7  --  100.4*  --  101.6*  --   --   --  101.6* 101.7*  PLT 196   < > 195  --  181  --  194  --   --   --  213 287   < > = values in this interval not displayed.   Cardiac Enzymes: No results for input(s): CKTOTAL, CKMB, CKMBINDEX, TROPONINI in the last 168 hours. BNP (last 3 results) No results for input(s): PROBNP in the last 8760 hours. CBG: Recent Labs  Lab 03/20/19 0002 03/20/19 0317 03/20/19 0742 03/20/19 1154 03/20/19 1607  GLUCAP 208* 179* 144* 196* 209*   D-Dimer: No results for input(s): DDIMER in the last 72 hours. Hgb A1c: No results for input(s): HGBA1C in the last 72 hours. Lipid Profile: No results for input(s): CHOL, HDL, LDLCALC, TRIG, CHOLHDL, LDLDIRECT in the last 72 hours. Thyroid function studies: No results for input(s): TSH, T4TOTAL, T3FREE, THYROIDAB  in the last 72 hours.  Invalid input(s): FREET3 Anemia work up: No results for input(s): VITAMINB12, FOLATE, FERRITIN, TIBC, IRON, RETICCTPCT in the last 72 hours. Sepsis Labs: Recent Labs  Lab 03/14/19 1206 03/14/19 1207 03/14/19 1643  03/17/19 0230 03/18/19 0220 03/19/19 0130 03/20/19 0600  PROCALCITON 3.52  --   --   --   --   --   --   --   WBC 17.8*  --   --    < > 14.7* 12.8* 15.9* 19.7*  LATICACIDVEN  --  1.2 1.2  --   --   --   --   --    < > = values in this interval not displayed.   Microbiology Recent Results (from the past 240 hour(s))  Culture, blood (Routine X 2) w Reflex to ID Panel     Status: None   Collection Time: 03/14/19 11:45 AM  Result Value Ref Range Status   Specimen Description   Final    BLOOD RIGHT ANTECUBITAL Performed at [facility] [address], [place], [place] [address]    Special Requests   Final    BOTTLES DRAWN AEROBIC ONLY Blood Culture adequate  volume Performed at [facility] [address], [place], [place] [address]    Culture   Final    NO GROWTH 5 DAYS Performed at [facility], [place] [address], [place], [place] [address]    Report Status 03/19/2019 FINAL  Final  Culture, blood (Routine X 2) w Reflex to ID Panel     Status: None   Collection Time: 03/14/19 11:50 AM  Result Value Ref Range Status   Specimen Description   Final    BLOOD RIGHT HAND Performed at [facility] [address] [place], [place] [address]    Special Requests   Final    BOTTLES DRAWN AEROBIC ONLY Blood Culture adequate volume Performed at [facility] [address] [place], [place] [address]    Culture   Final    NO GROWTH 5 DAYS Performed at [facility], [place] [address] [place], [place] [address]    Report Status 03/19/2019 FINAL  Final     Medications:   . chlorhexidine  15 mL Mouth/Throat BID  . Chlorhexidine Gluconate Cloth  6 each Topical Daily  . docusate  100 mg Per Tube BID  . famotidine  20 mg Per Tube Daily  . feeding supplement (PRO-STAT SUGAR FREE 64)  60 mL Per Tube BID  . free water  100 mL Per Tube Q8H  . insulin aspart  0-15 Units Subcutaneous Q4H  . insulin aspart  6 Units Subcutaneous Q4H  . insulin glargine  15 Units Subcutaneous BID  . mouth rinse  15 mL Mouth Rinse 10 times per day  . oxyCODONE  5 mg Oral Q6H  . propofol  500 mg Intravenous Once  . QUEtiapine  75 mg Per Tube BID  . sennosides  5 mL Oral BID  . sodium chloride flush  10-40 mL Intracatheter Q12H   Continuous Infusions: .  prismasol BGK 4/2.5 300 mL/hr at 03/20/19 1605  .  prismasol BGK 4/2.5 500 mL/hr at 03/20/19 1036  . sodium chloride    . sodium chloride 10 mL/hr at 03/20/19 1600  . sodium chloride 100 mL/hr at 03/20/19 1600  . feeding supplement (VITAL 1.5 CAL) 1,000 mL (03/20/19 1239)  . fluconazole (DIFLUCAN) IV Stopped (03/20/19 1207)  .  heparin 10,000 units/  20 mL infusion syringe 1,000 Units/hr (03/20/19 0329)  . heparin 600 Units/hr (03/20/19 1003)  . norepinephrine (LEVOPHED) Adult infusion Stopped (03/19/19 1939)  . piperacillin-tazobactam 3.375 g (03/20/19 1602)  . prismasol BGK 4/2.5 1,500 mL/hr at 03/20/19 1645  . vancomycin Stopped (03/20/19 1329)     LOS: 25 days   Kathie Dike, MD  [facility]  03/20/2019, 5:05 PM   Critical care: 69mins

## 2019-03-20 NOTE — Progress Notes (Addendum)
B and E for IV UFH Indication: Suspected PE  Not on File  Patient Measurements: Height: 5\' 5"  (165.1 cm) Weight: 127 lb 13.9 oz (58 kg) IBW/kg (Calculated) : 61.5 Heparin Dosing Weight: 66 kg  Vital Signs: Temp: 98.8 F (37.1 C) (05/16 0749) Temp Source: Axillary (05/16 0749) BP: 102/77 (05/16 0830) Pulse Rate: 94 (05/16 0830)  Labs: Recent Labs    03/18/19 0220  03/18/19 1112  03/19/19 0130 03/19/19 0840 03/19/19 1632 03/19/19 1643 03/20/19 0600  HGB 7.3*   < > 9.5*  --  7.5*  --   --   --  8.9*  HCT 24.9*   < > 28.0*  --  25.1*  --   --   --  29.8*  PLT 194  --   --   --  213  --   --   --  287  APTT 172*  --   --   --  169*  --   --   --  179*  HEPARINUNFRC  --   --   --    < > 0.81* 0.41  --  0.56 1.02*  CREATININE 1.98*  --   --    < > 1.82*  1.81*  --  1.81*  --  1.99*   < > = values in this interval not displayed.    Estimated Creatinine Clearance: 37.2 mL/min (A) (by C-G formula based on SCr of 1.99 mg/dL (H)).  Assessment: 60 yom with complications from AUQJF-[id number] including acute respiratory failure s/p tracheostomy and ATN on CRRT now s/p permcath. CCM transitioned to heparin drip 2/2 suspected PE.   Currently receiving IV heparin 700 units/hr via peripheral line along with heparin 1000 units/hr via CRRT circuit. HL was therapeutic x 2 yesterday but now up to 1.02. H/H improved, Plt wnl.    Goal of Therapy:  Heparin level 0.3-0.7 units/ml Monitor platelets by anticoagulation protocol: Yes   Plan:  - Hold heparin x 1 hour and restart at 600 units/hr  - F/u 8 hr HL at 1800  - Daily CBC/HL - Monitor for bleeding     Albertina Parr, PharmD., BCPS Clinical Pharmacist Clinical phone for 03/20/19 until 7pm: [phone]  Addendum: Follow up heparin level this evening is now therapeutic at 0.46. Will continue IV heparin at 600 units/hr and f/u confirmatory level with AM labs   Albertina Parr, PharmD.,  BCPS Clinical Pharmacist Clinical phone for 03/20/19 until 7pm: [phone]

## 2019-03-20 NOTE — Progress Notes (Signed)
Assisted tele visit to patient with wife.  Jermain Curt M, RN  

## 2019-03-20 NOTE — Progress Notes (Signed)
Subjective:  No UOP recorded  - norepi weaned off last night   - no machine issues reported  Objective Vital signs in last 24 hours: Vitals:   03/20/19 0830 03/20/19 0900 03/20/19 0930 03/20/19 1000  BP: 102/77 101/79 109/90 102/78  Pulse: 94 (!) 115 (!) 124 (!) 116  Resp: (!) 25 (!) 26 (!) 29 (!) 26  Temp:      TempSrc:      SpO2: 95% 99% 93% 100%  Weight:      Height:       Weight change: -8.5 kg  Intake/Output Summary (Last 24 hours) at 03/20/2019 1146 Last data filed at 03/20/2019 1030 Gross per 24 hour  Intake 2755.94 ml  Output 3595 ml  Net -839.06 ml    Assessment/ Plan: Pt is a 48 y.o. yo male who was admitted on 02/23/2019 with anuric AKI due to ATN in the setting of COVID  Assessment/Plan: 1. AKI-  On CRRT since 4/21- had been hemodynamically unstable but now maybe better- pressors weaned .  NO UOP so no signs of renal recovery and still requiring RRT.  Need to do CRRT while here.  Now have a tunneled HD cath.  If comes off vent could possibly be transferred to [facility] to continue with intermittent HD ?   2. HTN/vol-  Seems like was running positive on CRRT but possibly appropriate since able to wean pressors, negative 900 overnight  - from vital signs maybe looking dry- will change to even and give him a liter outside the machine today  3. Anemia- hgb low- supportive care for now- no iron in the setting of sepsis - transfuse as needed  4. ID-  covid positive - also vanc and zosyn  5. VDRF-  O2 req has dec but still on vent  6.  [place]- K is fine on all 4 k bath- no change , got phos 5/13-and 5/15  - calcium higher - unclear why     [name]    Labs: Basic Metabolic Panel: Recent Labs  Lab 03/19/19 0130 03/19/19 1632 03/20/19 0600  NA 134*  134* 136 136  K 5.3*  5.3* 4.6 4.9  CL 98  98 98 96*  CO2 26  26 25 27   GLUCOSE 175*  176* 170* 120*  BUN 33*  32* 34* 36*  CREATININE 1.82*  1.81* 1.81* 1.99*  CALCIUM 9.1  8.9 9.0 10.1  PHOS 2.3*  2.0* 3.6   Liver Function Tests: Recent Labs  Lab 03/14/19 1206  03/19/19 0130 03/19/19 1632 03/20/19 0600  AST 29  --   --   --   --   ALT 30  --   --   --   --   ALKPHOS 144*  --   --   --   --   BILITOT 0.7  --   --   --   --   PROT 7.8  --   --   --   --   ALBUMIN 3.1*   < > 2.8* 2.9* 3.5   < > = values in this interval not displayed.   No results for input(s): LIPASE, AMYLASE in the last 168 hours. No results for input(s): AMMONIA in the last 168 hours. CBC: Recent Labs  Lab 03/14/19 1206  03/16/19 0153  03/17/19 0230  03/18/19 0220  03/18/19 1112 03/19/19 0130 03/20/19 0600  WBC 17.8*   < > 22.0*  --  14.7*  --  12.8*  --   --  15.9* 19.7*  NEUTROABS 10.8*  --   --   --   --   --   --   --   --   --   --   HGB 8.0*   < > 8.1*   < > 7.3*   < > 7.3*   < > 9.5* 7.5* 8.9*  HCT 25.4*   < > 26.1*   < > 25.0*   < > 24.9*   < > 28.0* 25.1* 29.8*  MCV 98.8   < > 96.7  --  100.4*  --  101.6*  --   --  101.6* 101.7*  PLT 196   < > 195  --  181  --  194  --   --  213 287   < > = values in this interval not displayed.   Cardiac Enzymes: No results for input(s): CKTOTAL, CKMB, CKMBINDEX, TROPONINI in the last 168 hours. CBG: Recent Labs  Lab 03/19/19 1629 03/19/19 2043 03/20/19 0002 03/20/19 0317 03/20/19 0742  GLUCAP 163* 190* 208* 179* 144*    Iron Studies: No results for input(s): IRON, TIBC, TRANSFERRIN, FERRITIN in the last 72 hours. Studies/Results: Dg Chest Port 1 View  Result Date: 03/18/2019 CLINICAL DATA:  Acute respiratory failure, known positive COVID-19 EXAM: PORTABLE CHEST 1 VIEW COMPARISON:  03/17/2019 FINDINGS: Cardiac shadow is stable. Tracheostomy tube and bilateral jugular catheters are again noted and stable. Gastric catheter extends into the stomach. Patchy infiltrates are noted throughout both lungs stable from the previous day. No acute bony abnormality is noted. IMPRESSION: Stable bilateral opacities Tubes and lines stable from the previous day.  Electronically Signed   By: Inez Catalina M.D.   On: 03/18/2019 12:12   Dg Abd Portable 1v  Result Date: 03/20/2019 CLINICAL DATA:  NG tube placement. EXAM: PORTABLE ABDOMEN - 1 VIEW COMPARISON:  Radiograph 03/09/2019 FINDINGS: Tip and side port of the enteric tube below the diaphragm in the stomach. No small bowel dilatation to suggest obstruction. Minimal gaseous distension of transverse colon. IMPRESSION: Tip and side port of the enteric tube below the diaphragm in the stomach. Electronically Signed   By: Keith Rake M.D.   On: 03/20/2019 01:01   Medications: Infusions: .  prismasol BGK 4/2.5 300 mL/hr at 03/19/19 2206  .  prismasol BGK 4/2.5 500 mL/hr at 03/20/19 0016  . sodium chloride    . sodium chloride 10 mL/hr at 03/20/19 1000  . dexmedetomidine (PRECEDEX) IV infusion Stopped (03/19/19 1751)  . feeding supplement (VITAL 1.5 CAL) 1,000 mL (03/18/19 1452)  . fluconazole (DIFLUCAN) IV 400 mg (03/20/19 1007)  . heparin 10,000 units/ 20 mL infusion syringe 1,000 Units/hr (03/20/19 0329)  . heparin 600 Units/hr (03/20/19 1003)  . norepinephrine (LEVOPHED) Adult infusion Stopped (03/19/19 1939)  . piperacillin-tazobactam Stopped (03/20/19 8756)  . prismasol BGK 4/2.5 1,500 mL/hr at 03/20/19 0522  . vancomycin Stopped (03/19/19 1327)    Scheduled Medications: . chlorhexidine  15 mL Mouth/Throat BID  . Chlorhexidine Gluconate Cloth  6 each Topical Daily  . docusate  100 mg Per Tube BID  . famotidine  20 mg Per Tube Daily  . feeding supplement (PRO-STAT SUGAR FREE 64)  60 mL Per Tube BID  . free water  100 mL Per Tube Q8H  . insulin aspart  0-15 Units Subcutaneous Q4H  . insulin aspart  6 Units Subcutaneous Q4H  . insulin glargine  15 Units Subcutaneous BID  . mouth rinse  15 mL Mouth Rinse 10 times  per day  . oxyCODONE  10 mg Oral Q6H  . propofol  500 mg Intravenous Once  . QUEtiapine  75 mg Per Tube BID  . sennosides  5 mL Oral BID  . sodium chloride flush  10-40 mL  Intracatheter Q12H    have reviewed scheduled and prn medications.    Physical Exam: General - adult male intubated  Card - S1S2 Pulm - occ rhonchi  abd - soft /nd  Ext - trace edema   RIJ tunneled catheter placed 5/13  Due to the nature of this patient's [id number] with isolation and in keeping with efforts to prevent the spread of infection and to conserve personal protective equipment, a physical exam was not personally performed.  Patient's symptoms and exam were discussed in detail with the RN.  A chart review of other providers notes and the patient's lab work as well as review of other pertinent studies was performed.  Exam details from prior documentation were reviewed specifically and confirmed with the bedside nurse.  Location of service: [place]    03/20/2019,11:46 AM  LOS: 25 days

## 2019-03-21 ENCOUNTER — Inpatient Hospital Stay (HOSPITAL_COMMUNITY): Payer: HRSA Program

## 2019-03-21 LAB — RENAL FUNCTION PANEL
Albumin: 2.8 g/dL — ABNORMAL LOW (ref 3.5–5.0)
Albumin: 3 g/dL — ABNORMAL LOW (ref 3.5–5.0)
Anion gap: 11 (ref 5–15)
Anion gap: 9 (ref 5–15)
BUN: 34 mg/dL — ABNORMAL HIGH (ref 6–20)
BUN: 35 mg/dL — ABNORMAL HIGH (ref 6–20)
CO2: 26 mmol/L (ref 22–32)
CO2: 27 mmol/L (ref 22–32)
Calcium: 9.5 mg/dL (ref 8.9–10.3)
Calcium: 9.4 mg/dL (ref 8.9–10.3)
Chloride: 98 mmol/L (ref 98–111)
Chloride: 99 mmol/L (ref 98–111)
Creatinine, Ser: 1.89 mg/dL — ABNORMAL HIGH (ref 0.61–1.24)
Creatinine, Ser: 1.78 mg/dL — ABNORMAL HIGH (ref 0.61–1.24)
GFR calc Af Amer: 48 mL/min — ABNORMAL LOW (ref >60)
GFR calc Af Amer: 51 mL/min — ABNORMAL LOW (ref >60)
GFR calc non Af Amer: 41 mL/min — ABNORMAL LOW (ref >60)
GFR calc non Af Amer: 44 mL/min — ABNORMAL LOW (ref >60)
Glucose, Bld: 146 mg/dL — ABNORMAL HIGH (ref 70–99)
Glucose, Bld: 143 mg/dL — ABNORMAL HIGH (ref 70–99)
Phosphorus: 2.9 mg/dL (ref 2.5–4.6)
Phosphorus: 3 mg/dL (ref 2.5–4.6)
Potassium: 5.2 mmol/L — ABNORMAL HIGH (ref 3.5–5.1)
Potassium: 4.8 mmol/L (ref 3.5–5.1)
Sodium: 135 mmol/L (ref 135–145)
Sodium: 135 mmol/L (ref 135–145)

## 2019-03-21 LAB — CBC
HCT: 27.4 % — ABNORMAL LOW (ref 39.0–52.0)
Hemoglobin: 7.9 g/dL — ABNORMAL LOW (ref 13.0–17.0)
MCH: 29.5 pg (ref 26.0–34.0)
MCHC: 28.8 g/dL — ABNORMAL LOW (ref 30.0–36.0)
MCV: 102.2 fL — ABNORMAL HIGH (ref 80.0–100.0)
Platelets: 308 10*3/uL (ref 150–400)
RBC: 2.68 MIL/uL — ABNORMAL LOW (ref 4.22–5.81)
RDW: 18.3 % — ABNORMAL HIGH (ref 11.5–15.5)
WBC: 22.6 10*3/uL — ABNORMAL HIGH (ref 4.0–10.5)
nRBC: 0.7 % — ABNORMAL HIGH (ref 0.0–0.2)

## 2019-03-21 LAB — APTT: aPTT: 75 seconds — ABNORMAL HIGH (ref 24–36)

## 2019-03-21 LAB — GLUCOSE, CAPILLARY
Glucose-Capillary: 132 mg/dL — ABNORMAL HIGH (ref 70–99)
Glucose-Capillary: 135 mg/dL — ABNORMAL HIGH (ref 70–99)
Glucose-Capillary: 146 mg/dL — ABNORMAL HIGH (ref 70–99)
Glucose-Capillary: 202 mg/dL — ABNORMAL HIGH (ref 70–99)
Glucose-Capillary: 205 mg/dL — ABNORMAL HIGH (ref 70–99)

## 2019-03-21 LAB — MAGNESIUM: Magnesium: 3 mg/dL — ABNORMAL HIGH (ref 1.7–2.4)

## 2019-03-21 LAB — HEPARIN LEVEL (UNFRACTIONATED): Heparin Unfractionated: 0.42 IU/mL (ref 0.30–0.70)

## 2019-03-21 NOTE — Progress Notes (Signed)
I was late with scheduled video call with daughter. Sent request at 8:30 via email without response. Attempted to call and inform her that the invitation was sent but unable to call due to [place] number. Informed bedside nurse. I will be glad to invite again if they call.

## 2019-03-21 NOTE — Progress Notes (Signed)
[facility] PROGRESS NOTE    Progress Note  Jonathan [name]  KWI:[id number] DOB: Mar 05, 1971 DOA: 02/23/2019 PCP: Patient, No Pcp Per     Brief Narrative:   Jonathan [name] is an 48 y.o. male [place] speaking, arrived from [place] on 02/13/2019 and was living in close quarters with 6 other workers. He developed increasing rrespiratory distress and was intubated for ARDS due to COVID-19 infection. He was started on CRRT due to ATN, paralyzed and prone. He was transferred to [facility] for further management. He was eventually extubated, but quickly became short of breath again and aspirated when he started vomiting. He was re intubated and ended up with tracheostomy.   Medications: 4/22- Tocluzimab X 1 03/12/2019 on IV heparin drip. 03/07/2019, convalescent plasma 03/09/2019 paralytic stopped  Events: 02/23/2019 admitted to the ICU. 02/24/2019 started on CRRT 4/21-4/24 Proned daily 4/28 -4/30 proned daily  03/04/2019 increase in oxygen demand require paralytic for 24 hours. 03/09/2019 hematochezia from gastric aspirate  Lines and tubes: 4/21 - ETT >>5.11.2020 4/21 - RIJ CVC [facility] >> 4/22 L IJ HD Cath> 5/13 Permacath right  Antibiotics: 02/23/2019-425 2020 azithromycin and Rocephin 02/23/2019-02/24/2019 03/06/2019 Rocephin 5/11-5/17 Vancomycin 5/12-5/18 Zosyn 5/12-5/17 Fluconazole for candida in tracheal aspirate and recent Actemra   Assessment/Plan:   Acute respiratory failure with hypoxia/COVID-19/  ARDS (adult respiratory distress syndrome) ([place]) Continue vent management per PCCM on ARDS HD protocol. Extubated on 03/15/2019 and was stable throughout the day.  The following night developed respiratory distress had to be placed on BiPAP. He also had vomiting at this time and likely aspirated Re-Intubated on 03/16/2019 and had tracheostomy placed Currently on intravenous antibiotics (vancomycin and zosyn) for pneumonia which are due to complete 5/18  Echocardiogram performed shows right-sided heart failure.  He has been empirically started on heparin for concerns for underlying pulmonary embolus. Weaning trials per CCM  Shock  Patient had been hypotensive requiring levophed.  This has since been weaned off and blood pressures have stabilized   AKI due to ATN: Continue on CRRT per Nephrology, urine output remains poor Underwent tunneled HD catheter on 03/17/2019.  Anemia due to chronic illness and possibly blood loss bleed: Hemoglobin ranging between 7-9. No signs of ongoing blood loss. Continue to follow hemoglobin closely  Leukocytosis: Unclear etiology, no obvious signs of infection. No fever. He did have some loose stools today, but none today. Continue to monitor.  Pressure injury of skin on the nose:  DVT prophylaxis: Heparin infusion Family Communication: Wife Energy manager number [phone]. Updated daughter over the phone with interpreter assistance Disposition Plan/Barrier to D/C: Keep in ICU. Code Status:     Code Status Orders  (From admission, onward)         Start     Ordered   03/02/19 1658  Limited resuscitation (code)  Continuous    Question Answer Comment  In the event of cardiac or respiratory ARREST: Initiate Code Blue, Call Rapid Response No   In the event of cardiac or respiratory ARREST: Perform CPR No   In the event of cardiac or respiratory ARREST: Perform Intubation/Mechanical Ventilation Yes   In the event of cardiac or respiratory ARREST: Use NIPPV/BiPAp only if indicated Yes   In the event of cardiac or respiratory ARREST: Administer ACLS medications if indicated Yes   In the event of cardiac or respiratory ARREST: Perform Defibrillation or Cardioversion if indicated Yes      03/02/19 1657        Code Status  History    Date Active Date Inactive Code Status Order ID Comments User Context   02/23/2019 1051 03/02/2019 1657 Full Code [id number]  Kipp Brood, MD ED         IV Access:    Permacath right 5/13   Procedures and diagnostic studies:   Dg Chest Port 1 View  Result Date: 03/21/2019 CLINICAL DATA:  Acute respiratory failure EXAM: PORTABLE CHEST 1 VIEW COMPARISON:  03/18/2019 FINDINGS: Tracheostomy in satisfactory position. Right IJ venous catheter terminates in the right atrium. Left IJ venous catheter has been removed. Enteric tube terminates in the gastric cardia. Multifocal patchy opacities, right upper lobe predominant, grossly in pains. No pleural effusion or pneumothorax. The heart is normal in size. IMPRESSION: Tracheostomy in satisfactory position. Additional support apparatus as above. Multifocal pneumonia, grossly unchanged. Electronically Signed   By: Julian Hy M.D.   On: 03/21/2019 05:20   Dg Abd Portable 1v  Result Date: 03/20/2019 CLINICAL DATA:  NG tube placement. EXAM: PORTABLE ABDOMEN - 1 VIEW COMPARISON:  Radiograph 03/09/2019 FINDINGS: Tip and side port of the enteric tube below the diaphragm in the stomach. No small bowel dilatation to suggest obstruction. Minimal gaseous distension of transverse colon. IMPRESSION: Tip and side port of the enteric tube below the diaphragm in the stomach. Electronically Signed   By: Keith Rake M.D.   On: 03/20/2019 01:01     Medical Consultants:   PCCM Nephrology  Anti-Infectives:   Azithromycin 4/21 >>4/25 Ceftriaxone 4/22 >>4/25 Hydroxychloroquine 4/21 > 4/22 Vancomycin 03/14/2019>5/17 Zosyn 03/16/2019>5/18  Subjective:    Jonathan [name] is sitting up in bed, reports some mild discomfort in abdomen  Objective:    Vitals:   03/21/19 1100 03/21/19 1200 03/21/19 1300 03/21/19 1400  BP: 96/66 103/71 108/72 101/68  Pulse: (!) 101 (!) 101 95 91  Resp: (!) 22 19 18 19   Temp:  98.2 F (36.8 C)    TempSrc:  Axillary    SpO2: 96% 100% 99% 98%  Weight:      Height:        Intake/Output Summary (Last 24 hours) at 03/21/2019 1419 Last data filed at 03/21/2019 1400  Gross per 24 hour  Intake 3703.68 ml  Output 2228 ml  Net 1475.68 ml   Filed Weights   03/19/19 0415 03/20/19 0500 03/21/19 0425  Weight: 66.5 kg 58 kg 62.9 kg    Exam: General exam: Alert, awake, no distress Respiratory system: Clear to auscultation. Respiratory effort normal. HD catheter right chest Cardiovascular system:RRR. No murmurs, rubs, gallops. Gastrointestinal system: Abdomen is nondistended, soft and nontender. No organomegaly or masses felt. Normal bowel sounds heard. Central nervous system: No focal neurological deficits. Extremities: No C/C/E, +pedal pulses Skin: No rashes, lesions or ulcers Psychiatry: limited exam due to medical condition    Data Reviewed:    Labs: Basic Metabolic Panel: Recent Labs  Lab 03/17/19 0230  03/18/19 0220  03/19/19 0130 03/19/19 1632 03/20/19 0600 03/20/19 1600 03/21/19 0640  NA 134*   < > 137   < > 134*  134* 136 136 134* 135  K 4.5   < > 4.0   < > 5.3*  5.3* 4.6 4.9 5.1 5.2*  CL 100   < > 102   < > 98  98 98 96* 98 98  CO2 24   < > 27   < > 26  26 25 27 23 26   GLUCOSE 204*   < > 91   < > 175*  176*  170* 120* 215* 146*  BUN 42*   < > 38*   < > 33*  32* 34* 36* 39* 34*  CREATININE 2.18*   < > 1.98*   < > 1.82*  1.81* 1.81* 1.99* 2.13* 1.89*  CALCIUM 8.6*   < > 8.5*   < > 9.1  8.9 9.0 10.1 9.4 9.5  MG 3.0*  --  2.9*  --  2.8*  --  3.4*  --  3.0*  PHOS 2.3*   < > 3.1   < > 2.3* 2.0* 3.6 3.4 2.9   < > = values in this interval not displayed.   GFR Estimated Creatinine Clearance: 41.6 mL/min (A) (by C-G formula based on SCr of 1.89 mg/dL (H)). Liver Function Tests: Recent Labs  Lab 03/19/19 0130 03/19/19 1632 03/20/19 0600 03/20/19 1600 03/21/19 0640  ALBUMIN 2.8* 2.9* 3.5 2.9* 2.8*   No results for input(s): LIPASE, AMYLASE in the last 168 hours. No results for input(s): AMMONIA in the last 168 hours. Coagulation profile Recent Labs  Lab 03/16/19 0845  INR 1.2   COVID-19 Labs  No results for  input(s): DDIMER, FERRITIN, LDH, CRP in the last 72 hours.  Lab Results  Component Value Date   SARSCOV2NAA POSITIVE (A) 02/23/2019    CBC: Recent Labs  Lab 03/17/19 0230  03/18/19 0220  03/18/19 1029 03/18/19 1112 03/19/19 0130 03/20/19 0600 03/21/19 0640  WBC 14.7*  --  12.8*  --   --   --  15.9* 19.7* 22.6*  HGB 7.3*   < > 7.3*   < > 7.8* 9.5* 7.5* 8.9* 7.9*  HCT 25.0*   < > 24.9*   < > 23.0* 28.0* 25.1* 29.8* 27.4*  MCV 100.4*  --  101.6*  --   --   --  101.6* 101.7* 102.2*  PLT 181  --  194  --   --   --  213 287 308   < > = values in this interval not displayed.   Cardiac Enzymes: No results for input(s): CKTOTAL, CKMB, CKMBINDEX, TROPONINI in the last 168 hours. BNP (last 3 results) No results for input(s): PROBNP in the last 8760 hours. CBG: Recent Labs  Lab 03/20/19 1607 03/20/19 2034 03/21/19 0041 03/21/19 0342 03/21/19 1130  GLUCAP 209* 142* 202* 146* 205*   D-Dimer: No results for input(s): DDIMER in the last 72 hours. Hgb A1c: No results for input(s): HGBA1C in the last 72 hours. Lipid Profile: No results for input(s): CHOL, HDL, LDLCALC, TRIG, CHOLHDL, LDLDIRECT in the last 72 hours. Thyroid function studies: No results for input(s): TSH, T4TOTAL, T3FREE, THYROIDAB in the last 72 hours.  Invalid input(s): FREET3 Anemia work up: No results for input(s): VITAMINB12, FOLATE, FERRITIN, TIBC, IRON, RETICCTPCT in the last 72 hours. Sepsis Labs: Recent Labs  Lab 03/14/19 1643  03/18/19 0220 03/19/19 0130 03/20/19 0600 03/21/19 0640  WBC  --    < > 12.8* 15.9* 19.7* 22.6*  LATICACIDVEN 1.2  --   --   --   --   --    < > = values in this interval not displayed.   Microbiology Recent Results (from the past 240 hour(s))  Culture, blood (Routine X 2) w Reflex to ID Panel     Status: None   Collection Time: 03/14/19 11:45 AM  Result Value Ref Range Status   Specimen Description   Final    BLOOD RIGHT ANTECUBITAL Performed at [facility] [address] [place],  [place] [address]    Special Requests   Final    BOTTLES DRAWN AEROBIC ONLY Blood Culture adequate volume Performed at [facility] [address], [place], [place] [address]    Culture   Final    NO GROWTH 5 DAYS Performed at [facility], [place] [address] [place], [place] [address]    Report Status 03/19/2019 FINAL  Final  Culture, blood (Routine X 2) w Reflex to ID Panel     Status: None   Collection Time: 03/14/19 11:50 AM  Result Value Ref Range Status   Specimen Description   Final    BLOOD RIGHT HAND Performed at [facility] [address] [place], [place] [address]    Special Requests   Final    BOTTLES DRAWN AEROBIC ONLY Blood Culture adequate volume Performed at [facility] [address], [place], [place] [address]    Culture   Final    NO GROWTH 5 DAYS Performed at [facility], [place] [address] [place], [place] [address]    Report Status 03/19/2019 FINAL  Final     Medications:   . chlorhexidine  15 mL Mouth/Throat BID  . Chlorhexidine Gluconate Cloth  6 each Topical Daily  . docusate  100 mg Per Tube BID  . famotidine  20 mg Per Tube Daily  . feeding supplement (PRO-STAT SUGAR FREE 64)  60 mL Per Tube BID  . free water  100 mL Per Tube Q8H  . insulin aspart  0-15 Units Subcutaneous Q4H  . insulin aspart  6 Units Subcutaneous Q4H  . insulin glargine  15 Units Subcutaneous BID  . mouth rinse  15 mL Mouth Rinse 10 times per day  . oxyCODONE  5 mg Oral Q6H  . propofol  500 mg Intravenous Once  . QUEtiapine  75 mg Per Tube BID  . sennosides  5 mL Oral BID  . sodium chloride flush  10-40 mL Intracatheter Q12H   Continuous Infusions: .  prismasol BGK 4/2.5 300 mL/hr at 03/21/19 0933  .  prismasol BGK 4/2.5 500 mL/hr at 03/21/19 0810  . sodium chloride    . sodium chloride 10 mL/hr at 03/21/19 1400  . feeding supplement (VITAL 1.5 CAL) 1,000 mL  (03/21/19 0918)  . heparin 10,000 units/ 20 mL infusion syringe 1,000 Units/hr (03/21/19 1400)  . heparin 600 Units/hr (03/21/19 1400)  . norepinephrine (LEVOPHED) Adult infusion Stopped (03/19/19 1939)  . piperacillin-tazobactam Stopped (03/21/19 1346)  . prismasol BGK 4/2.5 1,500 mL/hr at 03/21/19 1254     LOS: 26 days   Kathie Dike, MD  [facility]  03/21/2019, 2:19 PM   Critical care: 73mins

## 2019-03-21 NOTE — Progress Notes (Signed)
Subjective:  No UOP recorded  - stayed off pressors  - no machine issues reported  Objective Vital signs in last 24 hours: Vitals:   03/21/19 0800 03/21/19 0900 03/21/19 1000 03/21/19 1100  BP: 102/88 103/71 102/74 96/66  Pulse: 95 (!) 101 98 (!) 101  Resp: (!) 37 17 16 (!) 22  Temp: 98.5 F (36.9 C)     TempSrc: Axillary     SpO2: 95% 100% 97% 96%  Weight:      Height:       Weight change: 4.9 kg  Intake/Output Summary (Last 24 hours) at 03/21/2019 1203 Last data filed at 03/21/2019 1200 Gross per 24 hour  Intake 3629.68 ml  Output 2094 ml  Net 1535.68 ml    Assessment/ Plan: Pt is a 48 y.o. yo male who was admitted on 02/23/2019 with anuric AKI due to ATN in the setting of COVID  Assessment/Plan: 1. AKI-  On CRRT since 4/21- had been hemodynamically unstable but now maybe better- pressors weaned .  NO UOP so no signs of renal recovery and still requiring RRT.  Need to do CRRT while here.  Now have a tunneled HD cath.  If comes off vent could possibly be transferred to [facility] to continue with intermittent HD- given almost a month of RRT needs, making it less likely he will recover renal function ?   2. HTN/vol-  Seems like was running positive on CRRT but possibly appropriate since able to wean pressors,   - from vital signs yesterday looking dry- now running even - feel free to bolus outside the machine 3. Anemia- hgb low- supportive care for now- no iron in the setting of sepsis - transfuse as needed  4. ID-  covid positive - also vanc and zosyn  5. VDRF-  O2 req has dec but still on vent via trach now 6.  [place]- K is fine on all 4 k bath- no change , got phos 5/13-and 5/15  - calcium OK as well   [name]    Labs: Basic Metabolic Panel: Recent Labs  Lab 03/20/19 0600 03/20/19 1600 03/21/19 0640  NA 136 134* 135  K 4.9 5.1 5.2*  CL 96* 98 98  CO2 27 23 26   GLUCOSE 120* 215* 146*  BUN 36* 39* 34*  CREATININE 1.99* 2.13* 1.89*  CALCIUM 10.1 9.4 9.5   PHOS 3.6 3.4 2.9   Liver Function Tests: Recent Labs  Lab 03/14/19 1206  03/20/19 0600 03/20/19 1600 03/21/19 0640  AST 29  --   --   --   --   ALT 30  --   --   --   --   ALKPHOS 144*  --   --   --   --   BILITOT 0.7  --   --   --   --   PROT 7.8  --   --   --   --   ALBUMIN 3.1*   < > 3.5 2.9* 2.8*   < > = values in this interval not displayed.   No results for input(s): LIPASE, AMYLASE in the last 168 hours. No results for input(s): AMMONIA in the last 168 hours. CBC: Recent Labs  Lab 03/14/19 1206  03/17/19 0230  03/18/19 0220  03/19/19 0130 03/20/19 0600 03/21/19 0640  WBC 17.8*   < > 14.7*  --  12.8*  --  15.9* 19.7* 22.6*  NEUTROABS 10.8*  --   --   --   --   --   --   --   --  HGB 8.0*   < > 7.3*   < > 7.3*   < > 7.5* 8.9* 7.9*  HCT 25.4*   < > 25.0*   < > 24.9*   < > 25.1* 29.8* 27.4*  MCV 98.8   < > 100.4*  --  101.6*  --  101.6* 101.7* 102.2*  PLT 196   < > 181  --  194  --  213 287 308   < > = values in this interval not displayed.   Cardiac Enzymes: No results for input(s): CKTOTAL, CKMB, CKMBINDEX, TROPONINI in the last 168 hours. CBG: Recent Labs  Lab 03/20/19 1607 03/20/19 2034 03/21/19 0041 03/21/19 0342 03/21/19 1130  GLUCAP 209* 142* 202* 146* 205*    Iron Studies: No results for input(s): IRON, TIBC, TRANSFERRIN, FERRITIN in the last 72 hours. Studies/Results: Dg Chest Port 1 View  Result Date: 03/21/2019 CLINICAL DATA:  Acute respiratory failure EXAM: PORTABLE CHEST 1 VIEW COMPARISON:  03/18/2019 FINDINGS: Tracheostomy in satisfactory position. Right IJ venous catheter terminates in the right atrium. Left IJ venous catheter has been removed. Enteric tube terminates in the gastric cardia. Multifocal patchy opacities, right upper lobe predominant, grossly in pains. No pleural effusion or pneumothorax. The heart is normal in size. IMPRESSION: Tracheostomy in satisfactory position. Additional support apparatus as above. Multifocal pneumonia,  grossly unchanged. Electronically Signed   By: Julian Hy M.D.   On: 03/21/2019 05:20   Dg Abd Portable 1v  Result Date: 03/20/2019 CLINICAL DATA:  NG tube placement. EXAM: PORTABLE ABDOMEN - 1 VIEW COMPARISON:  Radiograph 03/09/2019 FINDINGS: Tip and side port of the enteric tube below the diaphragm in the stomach. No small bowel dilatation to suggest obstruction. Minimal gaseous distension of transverse colon. IMPRESSION: Tip and side port of the enteric tube below the diaphragm in the stomach. Electronically Signed   By: Keith Rake M.D.   On: 03/20/2019 01:01   Medications: Infusions: .  prismasol BGK 4/2.5 300 mL/hr at 03/21/19 0933  .  prismasol BGK 4/2.5 500 mL/hr at 03/21/19 0810  . sodium chloride    . sodium chloride Stopped (03/21/19 1013)  . feeding supplement (VITAL 1.5 CAL) 1,000 mL (03/21/19 0918)  . heparin 10,000 units/ 20 mL infusion syringe 1,000 Units/hr (03/21/19 1200)  . heparin 600 Units/hr (03/21/19 1200)  . norepinephrine (LEVOPHED) Adult infusion Stopped (03/19/19 1939)  . piperacillin-tazobactam Stopped (03/21/19 0853)  . prismasol BGK 4/2.5 1,500 mL/hr at 03/21/19 6283    Scheduled Medications: . chlorhexidine  15 mL Mouth/Throat BID  . Chlorhexidine Gluconate Cloth  6 each Topical Daily  . docusate  100 mg Per Tube BID  . famotidine  20 mg Per Tube Daily  . feeding supplement (PRO-STAT SUGAR FREE 64)  60 mL Per Tube BID  . free water  100 mL Per Tube Q8H  . insulin aspart  0-15 Units Subcutaneous Q4H  . insulin aspart  6 Units Subcutaneous Q4H  . insulin glargine  15 Units Subcutaneous BID  . mouth rinse  15 mL Mouth Rinse 10 times per day  . oxyCODONE  5 mg Oral Q6H  . propofol  500 mg Intravenous Once  . QUEtiapine  75 mg Per Tube BID  . sennosides  5 mL Oral BID  . sodium chloride flush  10-40 mL Intracatheter Q12H    have reviewed scheduled and prn medications.    Physical Exam: General - adult male intubated  Card - S1S2 Pulm -  occ rhonchi  abd -  soft /nd  Ext - trace edema   RIJ tunneled catheter placed 5/13  Due to the nature of this patient's BTVDF-17 with isolation and in keeping with efforts to prevent the spread of infection and to conserve personal protective equipment, a physical exam was not personally performed.  Patient's symptoms and exam were discussed in detail with the RN.  A chart review of other providers notes and the patient's lab work as well as review of other pertinent studies was performed.  Exam details from prior documentation were reviewed specifically and confirmed with the bedside nurse.  Location of service: [facility]    03/21/2019,12:03 PM  LOS: 26 days

## 2019-03-21 NOTE — Progress Notes (Addendum)
Able to place video call with wife. Patient waving and to camera. Using [email or web address] as contact

## 2019-03-21 NOTE — Progress Notes (Signed)
NAME:  Jonathan [name], MRN:  [id number], DOB:  [birth year], LOS: 28 ADMISSION DATE:  02/23/2019, CONSULTATION DATE:  02/23/2019 REFERRING MD:  Regenia Skeeter, CHIEF COMPLAINT:  Dyspnea   Brief History   48 y/o male admitted on February 23, 2019 with ARDS due to COVID-19.  He has had severe ARDS requiring prone positioning for many days, developed acute kidney injury requiring continuous renal replacement therapy.    Past Medical History  DM2 HTN  Significant Hospital Events   4/21 admitted and immediately proned on inverse ratio PC ventilation with high PEEP to maintain saturation >85%, Started on NO at 40ppm. 4/22- Tocluzimab X 1 4/22- started CVVHD 4/21-4/24 Proned daily 4/28 -4/30 proned daily , tapered NO to off  4/30 -late evening with increased O2 demands . No paralytics required x 24hr . No vent dysyncrony . Calm on Fent /Versed. Off precedex   5/1 - Remains on CRRT , no acute issues overnight --4.8 L I/O Balance since admit . Continues to Prone on/off 16 on and 8 off . -CXR stable diffuse bilateral infiltrates (slightly improved aeration ) . , Vent .80Fio2 .  5/3 convalescent plasma  Biomarkes improving but severe ARDS persists ad D-dimer still very high (13) without improvement. On IV Heparin gtt. Growing MSSA. SEdation changed to dilaudid gtt. Continues prone 16h and 8h syupine. Currently supine at 80% fio2, peep 18. On TF. STARTED NIMBEX  5/4-5/5-able to stay in the supine position, able to discontinue paralysis and maintain FiO2 0.50, PEEP 8-10.  5/5 hematochezia, bloody gastric aspirate  5/8 followed commands  5/11 extubated  5/12 re-intubated, tracheostomy  5/13 perm cath  Consults:  PCCM 4/21  Procedures:  4/21 - ETT 7.5 >> 5/12 5/12 Trach >>  4/21 - RIJ [facility] >> 4/22 L IJ HD Cath>   Significant Diagnostic Tests:  POC U/S 4/21: Bilateral interstitial pattern with bilateral dense consolidation at bases. Echo shows low-normal LVSF. RV  moderately dilated with septal shift.  5/14 Echo> LVEF > 65%, RV dilated, decreased RV function (moderate) with RVSP 60  Micro Data:  Blood cultures -PND SARS-CoV2 02/23/2019 - - POSITIVE HIV 4/21 -neg Quant gold 4/2 1- neg .....................Marland Kitchen resp 4/29 >>rare MSSA and yeast  5/10 blood > neg   Anti-covid RX and antimicrobial RX  Azithromycin 4/21 >>4/25 Ceftriaxone 4/22 >>4/25 Actemra 4/22 Hydroxychloroquine 4/21 > 4/22 ........................... Ceftriaxone 5/2 >>5/10 Cefepime 5/10>>> 5/12 Zosyn 5/12 >>  Vancomycin 5/10>>>  Interim history/subjective:  Remains on PC 20, PEEP 5.  Tidal volumes in the 500s.  Comfortable Having diarrhea over the last 24 hours  Objective   Blood pressure 102/74, pulse 98, temperature 98.5 F (36.9 C), temperature source Axillary, resp. rate 16, height 5\' 5"  (1.651 m), weight 62.9 kg, SpO2 97 %.    Vent Mode: PCV FiO2 (%):  [40 %] 40 % Set Rate:  [20 bmp] 20 bmp PEEP:  [5 cmH20] 5 cmH20 Plateau Pressure:  [17 cmH20-28 cmH20] 23 cmH20   Intake/Output Summary (Last 24 hours) at 03/21/2019 1055 Last data filed at 03/21/2019 1000 Gross per 24 hour  Intake 3646.65 ml  Output 2102 ml  Net 1544.65 ml   Filed Weights   03/19/19 0415 03/20/19 0500 03/21/19 0425  Weight: 66.5 kg 58 kg 62.9 kg    Examination:  General: Ill-appearing man, laying in bed, more comfortable today HENT: Trach midline, CDI, no secretions.  Healing skin lesions in maxillary area PULM: Distant but clear, few crackles CV: Regular, no murmur GI: Soft,  nondistended with positive bowel sounds MSK: No deformities Neuro: Awake, nods to questions, follows commands, moves extremities     Resolved Hospital Problem list   Constipation  Assessment & Plan:  Worsening hypoxemia despite minimal change in CXR: pulmonary embolism? Echo showed RV overload, pulmonary HTN, so worrisome for PE Continue heparin infusion Continue slow volume removal with CVVHD  Acute  respiratory failure with hypoxemia, recovering ARDS from COVID 19 Suspected aspiration pneumonitis versus pneumonia 5/12 (reintubated) Gas exchange somewhat improved with antibiotics, volume removal Continue PCV mode, try to transition to PS as he can tolerate Tracheostomy care Antibiotics, day 6 Zosyn, day 8 vancomycin.  Plan to stop the vancomycin 5/17, complete 7 days Zosyn  Acute renal failure.  Low likelihood for renal recovery CVVHD with volume removal as able If he can be liberated from mechanical ventilation, go out of the intensive care unit then he would likely need to be transitioned to [facility] to get IHD.  Need for sedation for MV Precedex off, benzodiazepines off Weaning enteral narcotics Dilaudid injections as needed for increased work of breathing, tachypnea.  He denies any pain currently Continue Seroquel  Diarrhea On tube feeding and has also received laxatives. With rising WBC would consider C. difficile testing if loose stools continue  Best practice:  Diet: tube feeding Pain/Anxiety/Delirium protocol (if indicated): Dilaudid as needed, RASS goal 0  VAP protocol (if indicated): yes DVT prophylaxis: heparin infusion GI prophylaxis: PPI Glucose control: SSI Mobility: BR, okay for PT/OT Code Status: limited code, no CPR Family Communication: Dr Roderic Palau updated 5/16 Disposition: remain in ICU  Labs   CBC: Recent Labs  Lab 03/14/19 1206  03/17/19 0230  03/18/19 0220  03/18/19 1029 03/18/19 1112 03/19/19 0130 03/20/19 0600 03/21/19 0640  WBC 17.8*   < > 14.7*  --  12.8*  --   --   --  15.9* 19.7* 22.6*  NEUTROABS 10.8*  --   --   --   --   --   --   --   --   --   --   HGB 8.0*   < > 7.3*   < > 7.3*   < > 7.8* 9.5* 7.5* 8.9* 7.9*  HCT 25.4*   < > 25.0*   < > 24.9*   < > 23.0* 28.0* 25.1* 29.8* 27.4*  MCV 98.8   < > 100.4*  --  101.6*  --   --   --  101.6* 101.7* 102.2*  PLT 196   < > 181  --  194  --   --   --  213 287 308   < > = values in this interval not  displayed.    Basic Metabolic Panel: Recent Labs  Lab 03/17/19 0230  03/18/19 0220  03/19/19 0130 03/19/19 1632 03/20/19 0600 03/20/19 1600 03/21/19 0640  NA 134*   < > 137   < > 134*  134* 136 136 134* 135  K 4.5   < > 4.0   < > 5.3*  5.3* 4.6 4.9 5.1 5.2*  CL 100   < > 102   < > 98  98 98 96* 98 98  CO2 24   < > 27   < > 26  26 25 27 23 26   GLUCOSE 204*   < > 91   < > 175*  176* 170* 120* 215* 146*  BUN 42*   < > 38*   < > 33*  32* 34* 36* 39* 34*  CREATININE 2.18*   < > 1.98*   < > 1.82*  1.81* 1.81* 1.99* 2.13* 1.89*  CALCIUM 8.6*   < > 8.5*   < > 9.1  8.9 9.0 10.1 9.4 9.5  MG 3.0*  --  2.9*  --  2.8*  --  3.4*  --  3.0*  PHOS 2.3*   < > 3.1   < > 2.3* 2.0* 3.6 3.4 2.9   < > = values in this interval not displayed.   GFR: Estimated Creatinine Clearance: 41.6 mL/min (A) (by C-G formula based on SCr of 1.89 mg/dL (H)). Recent Labs  Lab 03/14/19 1206 03/14/19 1207 03/14/19 1643  03/18/19 0220 03/19/19 0130 03/20/19 0600 03/21/19 0640  PROCALCITON 3.52  --   --   --   --   --   --   --   WBC 17.8*  --   --    < > 12.8* 15.9* 19.7* 22.6*  LATICACIDVEN  --  1.2 1.2  --   --   --   --   --    < > = values in this interval not displayed.    Liver Function Tests: Recent Labs  Lab 03/14/19 1206  03/19/19 0130 03/19/19 1632 03/20/19 0600 03/20/19 1600 03/21/19 0640  AST 29  --   --   --   --   --   --   ALT 30  --   --   --   --   --   --   ALKPHOS 144*  --   --   --   --   --   --   BILITOT 0.7  --   --   --   --   --   --   PROT 7.8  --   --   --   --   --   --   ALBUMIN 3.1*   < > 2.8* 2.9* 3.5 2.9* 2.8*   < > = values in this interval not displayed.   No results for input(s): LIPASE, AMYLASE in the last 168 hours. No results for input(s): AMMONIA in the last 168 hours.  ABG    Component Value Date/Time   PHART 7.381 03/18/2019 1112   PCO2ART 42.1 03/18/2019 1112   PO2ART 50.0 (L) 03/18/2019 1112   HCO3 25.1 03/18/2019 1112   TCO2 26 03/18/2019  1112   ACIDBASEDEF 1.0 03/16/2019 0450   O2SAT 85.0 03/18/2019 1112     Coagulation Profile: Recent Labs  Lab 03/14/19 1206 03/16/19 0845  INR 1.2 1.2    Cardiac Enzymes: No results for input(s): CKTOTAL, CKMB, CKMBINDEX, TROPONINI in the last 168 hours.  HbA1C: No results found for: HGBA1C  CBG: Recent Labs  Lab 03/20/19 1154 03/20/19 1607 03/20/19 2034 03/21/19 0041 03/21/19 0342  GLUCAP 196* 209* 142* 202* 146*     Critical care time: 31 minutes     Baltazar Apo, MD, PhD 03/21/2019, 10:55 AM [facility] [phone] or if no answer [phone]

## 2019-03-21 NOTE — Progress Notes (Signed)
ANTICOAGULATION & ANTIBIOTICS CONSULT NOTE  Pharmacy Consult for IV UFH; Sepsis Indication: Suspected PE; Vanc/Zosyn/Fluconazole   Not on File  Patient Measurements: Height: 5\' 5"  (165.1 cm) Weight: 138 lb 10.7 oz (62.9 kg) IBW/kg (Calculated) : 61.5 Heparin Dosing Weight: 66 kg  Vital Signs: Temp: 98.2 F (36.8 C) (05/17 0400) Temp Source: Axillary (05/17 0400) BP: 102/88 (05/17 0800) Pulse Rate: 95 (05/17 0800)  Labs: Recent Labs    03/18/19 1112  03/19/19 0130  03/19/19 1632  03/20/19 0600 03/20/19 1600 03/20/19 1700 03/21/19 0640  HGB 9.5*  --  7.5*  --   --   --  8.9*  --   --   --   HCT 28.0*  --  25.1*  --   --   --  29.8*  --   --   --   PLT  --   --  213  --   --   --  287  --   --   --   APTT  --   --  169*  --   --   --  179*  --   --  75*  HEPARINUNFRC  --    < > 0.81*   < >  --    < > 1.02*  --  0.46 0.42  CREATININE  --    < > 1.82*  1.81*  --  1.81*  --  1.99* 2.13*  --   --    < > = values in this interval not displayed.    Estimated Creatinine Clearance: 36.9 mL/min (A) (by C-G formula based on SCr of 2.13 mg/dL (H)).  Assessment: 57 yom with complications from [id number] including acute respiratory failure s/p tracheostomy and ATN on CRRT now s/p permcath. CCM transitioned to heparin drip 2/2 suspected PE.   AC: Currently receiving IV heparin 600 units/hr via peripheral line along with heparin 1000 units/hr via CRRT circuit. HL remains therapeutic this AM. H/H slight trend down and Plt wnl   ID: Currently on IV vancomycin, cefepime and fluconazole for sepsis. CRRT remains ongoing. WBC has trended up to 22   Hydroxyc 4/21>>4/22 Azithromycin 4/21>>4/25; 5/2>>5/8 Rocephin 4/21>>4/25; 5/2>>(5/9) 4/22 Toci x 1  5/3 convalescent plasma x 1 Vancomycin 5/10>> Cefepime 5/10>>5/12 Zosyn 5/12>> Fluconazole 5/12>>  5/14: 24 hr VT =13 on 750mg  q24h > increase to 1 gm q24h  4/21 COVID: positive 4/21 MRSA: neg 4/21 Blood cx: ngF 4/22 TB: neg  4/29  TA: MSSA, rare candida albicans 5/10 BCx2>> negF   Goal of Therapy:  Heparin level 0.3-0.7 units/ml Monitor platelets by anticoagulation protocol: Yes   Plan:  - Continue IV heparin at 600 units/hr  - Daily CBC/HL - Monitor for bleeding    -Stopping Vancomycin and fluconazole per MD. Will d/c Zosyn after today's doses are administered    Albertina Parr, PharmD., BCPS Clinical Pharmacist Clinical phone for 03/21/19 until 7pm: [phone]

## 2019-03-21 NOTE — Progress Notes (Signed)
Very appreciative of assistance and support provided from [place] with providing video call to patients family. Family was able to maintain video call for approximately 20+ minutes.

## 2019-03-22 ENCOUNTER — Inpatient Hospital Stay (HOSPITAL_COMMUNITY): Payer: HRSA Program

## 2019-03-22 DIAGNOSIS — U071 COVID-19: Secondary | ICD-10-CM

## 2019-03-22 DIAGNOSIS — M7989 Other specified soft tissue disorders: Secondary | ICD-10-CM

## 2019-03-22 LAB — C DIFFICILE QUICK SCREEN W PCR REFLEX
C Diff antigen: NEGATIVE
C Diff interpretation: NOT DETECTED
C Diff toxin: NEGATIVE

## 2019-03-22 LAB — RENAL FUNCTION PANEL
Albumin: 3.1 g/dL — ABNORMAL LOW (ref 3.5–5.0)
Albumin: 3.2 g/dL — ABNORMAL LOW (ref 3.5–5.0)
Anion gap: 12 (ref 5–15)
Anion gap: 11 (ref 5–15)
BUN: 36 mg/dL — ABNORMAL HIGH (ref 6–20)
BUN: 34 mg/dL — ABNORMAL HIGH (ref 6–20)
CO2: 25 mmol/L (ref 22–32)
CO2: 26 mmol/L (ref 22–32)
Calcium: 9.6 mg/dL (ref 8.9–10.3)
Calcium: 10 mg/dL (ref 8.9–10.3)
Chloride: 98 mmol/L (ref 98–111)
Chloride: 98 mmol/L (ref 98–111)
Creatinine, Ser: 1.79 mg/dL — ABNORMAL HIGH (ref 0.61–1.24)
Creatinine, Ser: 1.93 mg/dL — ABNORMAL HIGH (ref 0.61–1.24)
GFR calc Af Amer: 51 mL/min — ABNORMAL LOW (ref >60)
GFR calc Af Amer: 46 mL/min — ABNORMAL LOW (ref >60)
GFR calc non Af Amer: 44 mL/min — ABNORMAL LOW (ref >60)
GFR calc non Af Amer: 40 mL/min — ABNORMAL LOW (ref >60)
Glucose, Bld: 193 mg/dL — ABNORMAL HIGH (ref 70–99)
Glucose, Bld: 103 mg/dL — ABNORMAL HIGH (ref 70–99)
Phosphorus: 3.6 mg/dL (ref 2.5–4.6)
Phosphorus: 3 mg/dL (ref 2.5–4.6)
Potassium: 4.8 mmol/L (ref 3.5–5.1)
Potassium: 5 mmol/L (ref 3.5–5.1)
Sodium: 135 mmol/L (ref 135–145)
Sodium: 135 mmol/L (ref 135–145)

## 2019-03-22 LAB — CBC
HCT: 28 % — ABNORMAL LOW (ref 39.0–52.0)
Hemoglobin: 8.2 g/dL — ABNORMAL LOW (ref 13.0–17.0)
MCH: 30.1 pg (ref 26.0–34.0)
MCHC: 29.3 g/dL — ABNORMAL LOW (ref 30.0–36.0)
MCV: 102.9 fL — ABNORMAL HIGH (ref 80.0–100.0)
Platelets: 338 10*3/uL (ref 150–400)
RBC: 2.72 MIL/uL — ABNORMAL LOW (ref 4.22–5.81)
RDW: 18.4 % — ABNORMAL HIGH (ref 11.5–15.5)
WBC: 21.9 10*3/uL — ABNORMAL HIGH (ref 4.0–10.5)
nRBC: 0.7 % — ABNORMAL HIGH (ref 0.0–0.2)

## 2019-03-22 LAB — GLUCOSE, CAPILLARY
Glucose-Capillary: 201 mg/dL — ABNORMAL HIGH (ref 70–99)
Glucose-Capillary: 149 mg/dL — ABNORMAL HIGH (ref 70–99)
Glucose-Capillary: 150 mg/dL — ABNORMAL HIGH (ref 70–99)
Glucose-Capillary: 105 mg/dL — ABNORMAL HIGH (ref 70–99)
Glucose-Capillary: 146 mg/dL — ABNORMAL HIGH (ref 70–99)
Glucose-Capillary: 114 mg/dL — ABNORMAL HIGH (ref 70–99)
Glucose-Capillary: 166 mg/dL — ABNORMAL HIGH (ref 70–99)

## 2019-03-22 LAB — MAGNESIUM: Magnesium: 3.1 mg/dL — ABNORMAL HIGH (ref 1.7–2.4)

## 2019-03-22 LAB — HEPARIN LEVEL (UNFRACTIONATED)
Heparin Unfractionated: 0.63 IU/mL (ref 0.30–0.70)
Heparin Unfractionated: 0.64 IU/mL (ref 0.30–0.70)

## 2019-03-22 LAB — APTT: aPTT: 106 seconds — ABNORMAL HIGH (ref 24–36)

## 2019-03-22 MED ORDER — OXYCODONE HCL 5 MG PO TABS
5.0000 mg | ORAL_TABLET | Freq: Three times a day (TID) | ORAL | Status: DC
  Administered 2019-03-22 – 2019-03-27 (×14): 5 mg via ORAL
  Filled 2019-03-22 (×15): qty 1

## 2019-03-22 MED ORDER — QUETIAPINE FUMARATE 50 MG PO TABS
50.0000 mg | ORAL_TABLET | Freq: Two times a day (BID) | ORAL | Status: DC
  Administered 2019-03-22 – 2019-03-23 (×2): 50 mg
  Filled 2019-03-22 (×2): qty 1

## 2019-03-22 MED ORDER — HYDROMORPHONE HCL 1 MG/ML IJ SOLN
1.0000 mg | INTRAMUSCULAR | Status: DC | PRN
  Administered 2019-03-23 (×2): 1 mg via INTRAVENOUS
  Filled 2019-03-22 (×2): qty 1

## 2019-03-22 NOTE — Progress Notes (Signed)
NAME:  Courtenay Creger, MRN:  [id number], DOB:  [birth year], LOS: 87 ADMISSION DATE:  02/23/2019, CONSULTATION DATE:  02/23/2019 REFERRING MD:  Regenia Skeeter, CHIEF COMPLAINT:  Dyspnea   Brief History   48 y/o male admitted on February 23, 2019 with ARDS due to COVID-19.  He has had severe ARDS requiring prone positioning for many days, developed acute kidney injury requiring continuous renal replacement therapy.    Past Medical History  DM2 HTN  Significant Hospital Events   4/21 admitted and immediately proned on inverse ratio PC ventilation with high PEEP to maintain saturation >85%, Started on NO at 40ppm. 4/22- Tocluzimab X 1 4/22- started CVVHD 4/21-4/24 Proned daily 4/28 -4/30 proned daily , tapered NO to off  4/30 -late evening with increased O2 demands . No paralytics required x 24hr . No vent dysyncrony . Calm on Fent /Versed. Off precedex   5/1 - Remains on CRRT , no acute issues overnight --4.8 L I/O Balance since admit . Continues to Prone on/off 16 on and 8 off . -CXR stable diffuse bilateral infiltrates (slightly improved aeration ) . , Vent .80Fio2 .  5/3 convalescent plasma  Biomarkes improving but severe ARDS persists ad D-dimer still very high (13) without improvement. On IV Heparin gtt. Growing MSSA. SEdation changed to dilaudid gtt. Continues prone 16h and 8h syupine. Currently supine at 80% fio2, peep 18. On TF. STARTED NIMBEX  5/4-5/5-able to stay in the supine position, able to discontinue paralysis and maintain FiO2 0.50, PEEP 8-10.  5/5 hematochezia, bloody gastric aspirate  5/8 followed commands  5/11 extubated  5/12 re-intubated, tracheostomy  5/13 perm cath  Consults:  PCCM 4/21  Procedures:  4/21 - ETT 7.5 >> 5/12 5/12 Trach >>  4/21 - RIJ [facility] >> 4/22 L IJ HD Cath>   Significant Diagnostic Tests:  POC U/S 4/21: Bilateral interstitial pattern with bilateral dense consolidation at bases. Echo shows low-normal LVSF. RV  moderately dilated with septal shift.  5/14 Echo> LVEF > 65%, RV dilated, decreased RV function (moderate) with RVSP 60  Micro Data:  Blood cultures -PND SARS-CoV2 02/23/2019 - - POSITIVE HIV 4/21 -neg Quant gold 4/2 1- neg .....................Marland Kitchen resp 4/29 >>rare MSSA and yeast  5/10 blood > neg   Anti-covid RX and antimicrobial RX  Azithromycin 4/21 >>4/25 Ceftriaxone 4/22 >>4/25 Actemra 4/22 Hydroxychloroquine 4/21 > 4/22 ........................... Ceftriaxone 5/2 >>5/10 Cefepime 5/10>>> 5/12 Zosyn 5/12 >>  Vancomycin 5/10>>>  Interim history/subjective:  Remains on PC 20, PEEP 5.  Tidal volumes in the 500s.  Comfortable Having diarrhea over the last 24 hours  Objective   Blood pressure 99/74, pulse (!) 114, temperature 99.1 F (37.3 C), temperature source Oral, resp. rate 17, height 5\' 5"  (1.651 m), weight 63.4 kg, SpO2 99 %.    Vent Mode: PCV FiO2 (%):  [40 %] 40 % Set Rate:  [20 bmp] 20 bmp PEEP:  [5 cmH20] 5 cmH20 Pressure Support:  [8 cmH20] 8 cmH20 Plateau Pressure:  [18 cmH20-24 cmH20] 20 cmH20   Intake/Output Summary (Last 24 hours) at 03/22/2019 1241 Last data filed at 03/22/2019 1200 Gross per 24 hour  Intake 2369.2 ml  Output 2156 ml  Net 213.2 ml   Filed Weights   03/20/19 0500 03/21/19 0425 03/22/19 0500  Weight: 58 kg 62.9 kg 63.4 kg    Examination: I was at bedside as pt was examined by Dr [place] Pink. To avoid contributing to further coughing, tachypnea, I did not extend the exam  General: Ill  appearing man, NAD on PS mode MV Tachycardic on telemetry Awake and interacting, following commands.    Resolved Hospital Problem list   Constipation  Assessment & Plan:  Worsening hypoxemia despite minimal change in CXR: pulmonary embolism? Echo showed RV overload, pulmonary HTN, so worrisome for PE Continue heparin for now Would repeat his LE doppler studies (had this at the beginning of his illness, prior to this decompensation.  Volume  removal w CVVHD  Acute respiratory failure with hypoxemia, recovering ARDS from COVID 19 Suspected aspiration pneumonitis versus pneumonia 5/12 (reintubated) PSV as he can tolerate > doing better 5/18; PEEP to 5, FiO2 to 0.40. May be able to consider ATC soon.  Abx completed 5/17  Acute renal failure.  Low likelihood for renal recovery CVVHD >> probably keep I=O at this point Do not anticipate that he will have renal recovery If he can be liberated from mechanical ventilation, go out of the intensive care unit then he would likely need to be transitioned to [facility] to get IHD.  Need for sedation for MV precedex and benzo's off Weaning enteral narcs to q8h on 5/18 Decrease seroquel to 50mg  bid 5/18  Diarrhea On tube feeding and has also received laxatives. Still w loose stools >> will check C diff if another loose stool   Best practice:  Diet: tube feeding Pain/Anxiety/Delirium protocol (if indicated): Dilaudid as needed, RASS goal 0. Scheduled seroquel and oxycodone VAP protocol (if indicated): yes DVT prophylaxis: heparin infusion GI prophylaxis: PPI Glucose control: SSI Mobility: BR, okay for PT/OT Code Status: limited code, no CPR Family Communication:  Disposition: remain in ICU  Labs   CBC: Recent Labs  Lab 03/18/19 0220  03/18/19 1112 03/19/19 0130 03/20/19 0600 03/21/19 0640 03/22/19 0220  WBC 12.8*  --   --  15.9* 19.7* 22.6* 21.9*  HGB 7.3*   < > 9.5* 7.5* 8.9* 7.9* 8.2*  HCT 24.9*   < > 28.0* 25.1* 29.8* 27.4* 28.0*  MCV 101.6*  --   --  101.6* 101.7* 102.2* 102.9*  PLT 194  --   --  213 287 308 338   < > = values in this interval not displayed.    Basic Metabolic Panel: Recent Labs  Lab 03/18/19 0220  03/19/19 0130  03/20/19 0600 03/20/19 1600 03/21/19 0640 03/21/19 1552 03/22/19 0220  NA 137   < > 134*  134*   < > 136 134* 135 135 135  K 4.0   < > 5.3*  5.3*   < > 4.9 5.1 5.2* 4.8 4.8  CL 102   < > 98  98   < > 96* 98 98 99 98  CO2 27   < >  26  26   < > 27 23 26 27 25   GLUCOSE 91   < > 175*  176*   < > 120* 215* 146* 143* 193*  BUN 38*   < > 33*  32*   < > 36* 39* 34* 35* 36*  CREATININE 1.98*   < > 1.82*  1.81*   < > 1.99* 2.13* 1.89* 1.78* 1.79*  CALCIUM 8.5*   < > 9.1  8.9   < > 10.1 9.4 9.5 9.4 9.6  MG 2.9*  --  2.8*  --  3.4*  --  3.0*  --  3.1*  PHOS 3.1   < > 2.3*   < > 3.6 3.4 2.9 3.0 3.6   < > = values in this interval not displayed.  GFR: Estimated Creatinine Clearance: 43.9 mL/min (A) (by C-G formula based on SCr of 1.79 mg/dL (H)). Recent Labs  Lab 03/19/19 0130 03/20/19 0600 03/21/19 0640 03/22/19 0220  WBC 15.9* 19.7* 22.6* 21.9*    Liver Function Tests: Recent Labs  Lab 03/20/19 0600 03/20/19 1600 03/21/19 0640 03/21/19 1552 03/22/19 0220  ALBUMIN 3.5 2.9* 2.8* 3.0* 3.1*   No results for input(s): LIPASE, AMYLASE in the last 168 hours. No results for input(s): AMMONIA in the last 168 hours.  ABG    Component Value Date/Time   PHART 7.381 03/18/2019 1112   PCO2ART 42.1 03/18/2019 1112   PO2ART 50.0 (L) 03/18/2019 1112   HCO3 25.1 03/18/2019 1112   TCO2 26 03/18/2019 1112   ACIDBASEDEF 1.0 03/16/2019 0450   O2SAT 85.0 03/18/2019 1112     Coagulation Profile: Recent Labs  Lab 03/16/19 0845  INR 1.2    Cardiac Enzymes: No results for input(s): CKTOTAL, CKMB, CKMBINDEX, TROPONINI in the last 168 hours.  HbA1C: No results found for: HGBA1C  CBG: Recent Labs  Lab 03/21/19 1938 03/22/19 0033 03/22/19 0443 03/22/19 0814 03/22/19 1121  GLUCAP 132* 201* 149* 150* 146*     N/A     Baltazar Apo, MD, PhD 03/22/2019, 12:41 PM [facility] [phone] or if no answer [phone]

## 2019-03-22 NOTE — Progress Notes (Signed)
Occupational Therapy Evaluation Patient Details Name: Jonathan [name] MRN: [id number] DOB: 1971-05-18 Today's Date: 03/22/2019    History of Present Illness 48 y.o. male admitted on 02/23/19 with AMS, O2 sats in the ED were 50%.  Pt dx with COVID 19 and resultant ARDS.  He was intubated 4/21 adn remains intubated.  His course is complicated by acute kidney injury requiring CVVHD.     Clinical Impression   Session completed while pt on vent/CRRT (FiO2 40%; Peep 5 - RR 35-42; HR 85-93; BP 100/70). PTA, pt independent with ADL and mobility. 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 [employer] interpreter Bernadene Person [phone]) used during session. Pt awake and alert and following all commands. Smiling/laughing at therapist's jokes. Pt moved into chair position using bed positioning. Able to maintain upright posture with good head control. Pt presents with generalized weakness ( R shoulder appears weaker than L with increased difficulty with lift off/ER). Recommend CIR for rehab. Excellent participation. Will follow acutely.   Recommend nsg place pt in "chair position" TID Encourage use of squeeze ball and writing to increase grip strength and dexterity Encourage daily ROM    Follow Up Recommendations  CIR(pending progress)    Equipment Recommendations  3 in 1 bedside commode    Recommendations for Other Services Rehab consult     Precautions / Restrictions Precautions Precautions: Other (comment) Precaution Comments: R IJ HD catheter, on vent/trach Required Braces or Orthoses: Other Brace(B Prevalon)      Mobility Bed Mobility Overal bed mobility: Needs Assistance             General bed mobility comments: Pt able to assist with scoot up in bed by pushing through BLE  Transfers                 General transfer comment: NT, placed in bed chair position for further extremity assessment.    Balance Overall balance assessment: Needs assistance   Sitting  balance-Leahy Scale: Poor                                     ADL either performed or assessed with clinical judgement   ADL Overall ADL's : Needs assistance/impaired Eating/Feeding: NPO   Grooming: Maximal assistance;Oral care;Wash/dry face   Upper Body Bathing: Maximal assistance;Bed level   Lower Body Bathing: Total assistance;Bed level   Upper Body Dressing : Total assistance                   Functional mobility during ADLs: Maximal assistance;+2 for physical assistance General ADL Comments: Bed level; limited due to CRRT and RR     Vision         Perception     Praxis      Pertinent Vitals/Pain Pain Assessment: Faces Faces Pain Scale: Hurts little more Pain Location: general discomfort wth BUE ROM Pain Descriptors / Indicators: Grimacing Pain Intervention(s): Limited activity within patient's tolerance;Repositioned     Hand Dominance Right   Extremity/Trunk Assessment Upper Extremity Assessment Upper Extremity Assessment: RUE deficits/detail;LUE deficits/detail RUE Deficits / Details: increased weakness in R shoulder as compared to L shoulder; difficulty with ER/lift off abdomen; elbow/wrist/hand ROM WFL; shoulder ROM limited to 90 due to HD catheter RUE Coordination: decreased fine motor;decreased gross motor LUE Deficits / Details: generalized wekaness; greater weakness proximally; shoulder appeasr stronger than R; will further assess LUE Coordination: decreased fine motor;decreased gross motor  Lower Extremity Assessment Lower Extremity Assessment: Defer to PT evaluation   Cervical / Trunk Assessment Cervical / Trunk Assessment: Normal;Other exceptions(rounded shoulder)   Communication Communication Communication: Tracheostomy   Cognition Arousal/Alertness: Awake/alert Behavior During Therapy: WFL for tasks assessed/performed Overall Cognitive Status: Difficult to assess                                 General  Comments: [employer] interreter, patient communicated well via interpreter, follows commands.   General Comments       Exercises Exercises: General Upper Extremity General Exercises - Upper Extremity Shoulder Flexion: AROM;AAROM;Both;15 reps;Seated(to 90) Shoulder ABduction: AROM;AAROM;Both;15 reps;Seated Elbow Flexion: AROM;AAROM;Both;15 reps;Seated Elbow Extension: AROM;AAROM;Both;15 reps;Seated Digit Composite Flexion: AROM;AAROM;Both;15 reps;Seated;Squeeze ball General Exercises - Lower Extremity Ankle Circles/Pumps: AROM;Both;10 reps Short Arc Quad: AROM;AAROM;Both;10 reps;Seated Other Exercises Other Exercises: shoulder rolls forward/backward   Shoulder Instructions      Home Living Family/patient expects to be discharged to:: Inpatient rehab                                 Additional Comments: From [place]; works on farm through company      Prior Functioning/Environment Level of Independence: Independent        Comments: migrant farm worker from [place]        OT Problem List: Decreased strength;Decreased range of motion;Decreased activity tolerance;Impaired balance (sitting and/or standing);Decreased coordination;Decreased safety awareness;Decreased knowledge of use of DME or AE;Decreased knowledge of precautions;Cardiopulmonary status limiting activity;Pain;Impaired UE functional use      OT Treatment/Interventions: Self-care/ADL training;Therapeutic exercise;Neuromuscular education;Energy conservation;DME and/or AE instruction;Therapeutic activities;Patient/family education;Balance training    OT Goals(Current goals can be found in the care plan section) Acute Rehab OT Goals Patient Stated Goal: nodds head to "to get stronger" OT Goal Formulation: With patient Time For Goal Achievement: 04/05/19 Potential to Achieve Goals: Good  OT Frequency: Min 3X/week   Barriers to D/C:            Co-evaluation   Reason for Co-Treatment:  Complexity of the patient's impairments (multi-system involvement);For patient/therapist safety PT goals addressed during session: Mobility/safety with mobility OT goals addressed during session: ADL's and self-care      AM-PAC OT "6 Clicks" Daily Activity     Outcome Measure Help from another person eating meals?: Total Help from another person taking care of personal grooming?: A Lot Help from another person toileting, which includes using toliet, bedpan, or urinal?: Total Help from another person bathing (including washing, rinsing, drying)?: A Lot Help from another person to put on and taking off regular upper body clothing?: Total Help from another person to put on and taking off regular lower body clothing?: Total 6 Click Score: 8   End of Session Nurse Communication: Mobility status;Other (comment)  Activity Tolerance: Patient tolerated treatment well Patient left: in bed;Other (comment)(chair position)  OT Visit Diagnosis: Other abnormalities of gait and mobility (R26.89);Muscle weakness (generalized) (M62.81);Pain Pain - part of body: (generalized)                Time: 2671-2458 OT Time Calculation (min): 30 min Charges:  OT General Charges $OT Visit: 1 Visit OT Evaluation $OT Eval High Complexity: 1 High  Brinley Rosete, OT/L   Acute OT Clinical Specialist [place] Pager [phone] Office [phone]   [facility] 03/22/2019, 6:34 PM

## 2019-03-22 NOTE — Progress Notes (Signed)
Subjective:  No UOP recorded  - stayed off pressors  - no machine issues reported  Objective Vital signs in last 24 hours: Vitals:   03/22/19 0900 03/22/19 1000 03/22/19 1042 03/22/19 1100  BP: 109/70 91/60  92/71  Pulse: (!) 101 (!) 108  (!) 112  Resp: 18 (!) 25  (!) 25  Temp:      TempSrc:      SpO2: 97% 100% 98% 97%  Weight:      Height:       Weight change: 0.5 kg  Intake/Output Summary (Last 24 hours) at 03/22/2019 1110 Last data filed at 03/22/2019 1100 Gross per 24 hour  Intake 2479.23 ml  Output 2214 ml  Net 265.23 ml    Assessment/ Plan: Pt is a 48 y.o. yo male who was admitted on 02/23/2019 with anuric AKI due to ATN in the setting of COVID  Assessment/Plan: 1. AKI-  On CRRT since 4/21- had been hemodynamically unstable but now maybe better- pressors weaned .  NO UOP so no signs of renal recovery and still requiring RRT.  Need to do CRRT while here.  Now have a tunneled HD cath.  If comes off vent could possibly be transferred to [facility] to continue with intermittent HD- given almost a month of RRT needs, making it less likely he will recover renal function   2. HTN/vol-  Seems like was running positive on CRRT but possibly appropriate since able to wean pressors,   - from vital signs yesterday looking dry- now running even - feel free to bolus outside the machine- 3. Anemia- hgb low- supportive care for now- no iron in the setting of sepsis - transfuse as needed  4. ID-  covid positive - also vanc and zosyn  5. VDRF-  O2 req has dec but still on vent via trach now 6.  [place]- K is fine on all 4 k bath- no change , got phos 5/13-and 5/15  - calcium OK as well   [name]    Labs: Basic Metabolic Panel: Recent Labs  Lab 03/21/19 0640 03/21/19 1552 03/22/19 0220  NA 135 135 135  K 5.2* 4.8 4.8  CL 98 99 98  CO2 26 27 25   GLUCOSE 146* 143* 193*  BUN 34* 35* 36*  CREATININE 1.89* 1.78* 1.79*  CALCIUM 9.5 9.4 9.6  PHOS 2.9 3.0 3.6   Liver Function  Tests: Recent Labs  Lab 03/21/19 0640 03/21/19 1552 03/22/19 0220  ALBUMIN 2.8* 3.0* 3.1*   No results for input(s): LIPASE, AMYLASE in the last 168 hours. No results for input(s): AMMONIA in the last 168 hours. CBC: Recent Labs  Lab 03/18/19 0220  03/19/19 0130 03/20/19 0600 03/21/19 0640 03/22/19 0220  WBC 12.8*  --  15.9* 19.7* 22.6* 21.9*  HGB 7.3*   < > 7.5* 8.9* 7.9* 8.2*  HCT 24.9*   < > 25.1* 29.8* 27.4* 28.0*  MCV 101.6*  --  101.6* 101.7* 102.2* 102.9*  PLT 194  --  213 287 308 338   < > = values in this interval not displayed.   Cardiac Enzymes: No results for input(s): CKTOTAL, CKMB, CKMBINDEX, TROPONINI in the last 168 hours. CBG: Recent Labs  Lab 03/21/19 1526 03/21/19 1938 03/22/19 0033 03/22/19 0443 03/22/19 0814  GLUCAP 135* 132* 201* 149* 150*    Iron Studies: No results for input(s): IRON, TIBC, TRANSFERRIN, FERRITIN in the last 72 hours. Studies/Results: Dg Chest Port 1 View  Result Date: 03/21/2019 CLINICAL  DATA:  Acute respiratory failure EXAM: PORTABLE CHEST 1 VIEW COMPARISON:  03/18/2019 FINDINGS: Tracheostomy in satisfactory position. Right IJ venous catheter terminates in the right atrium. Left IJ venous catheter has been removed. Enteric tube terminates in the gastric cardia. Multifocal patchy opacities, right upper lobe predominant, grossly in pains. No pleural effusion or pneumothorax. The heart is normal in size. IMPRESSION: Tracheostomy in satisfactory position. Additional support apparatus as above. Multifocal pneumonia, grossly unchanged. Electronically Signed   By: Julian Hy M.D.   On: 03/21/2019 05:20   Medications: Infusions: .  prismasol BGK 4/2.5 300 mL/hr at 03/22/19 0235  .  prismasol BGK 4/2.5 500 mL/hr at 03/22/19 0439  . sodium chloride    . sodium chloride Stopped (03/21/19 2128)  . feeding supplement (VITAL 1.5 CAL) 1,000 mL (03/21/19 0918)  . heparin 10,000 units/ 20 mL infusion syringe 1,000 Units/hr (03/22/19  1100)  . heparin 600 Units/hr (03/22/19 1100)  . norepinephrine (LEVOPHED) Adult infusion Stopped (03/19/19 1939)  . prismasol BGK 4/2.5 1,500 mL/hr at 03/22/19 0559    Scheduled Medications: . chlorhexidine  15 mL Mouth/Throat BID  . Chlorhexidine Gluconate Cloth  6 each Topical Daily  . famotidine  20 mg Per Tube Daily  . feeding supplement (PRO-STAT SUGAR FREE 64)  60 mL Per Tube BID  . free water  100 mL Per Tube Q8H  . insulin aspart  0-15 Units Subcutaneous Q4H  . insulin aspart  6 Units Subcutaneous Q4H  . insulin glargine  15 Units Subcutaneous BID  . mouth rinse  15 mL Mouth Rinse 10 times per day  . oxyCODONE  5 mg Oral Q6H  . propofol  500 mg Intravenous Once  . QUEtiapine  75 mg Per Tube BID  . sodium chloride flush  10-40 mL Intracatheter Q12H    have reviewed scheduled and prn medications.    Physical Exam: General - adult male intubated  Card - S1S2 Pulm - occ rhonchi  abd - soft /nd  Ext - trace edema   RIJ tunneled catheter placed 5/13  Due to the nature of this patient's NOMVE-72 with isolation and in keeping with efforts to prevent the spread of infection and to conserve personal protective equipment, a physical exam was not personally performed.  Patient's symptoms and exam were discussed in detail with the RN.  A chart review of other providers notes and the patient's lab work as well as review of other pertinent studies was performed.  Exam details from prior documentation were reviewed specifically and confirmed with the bedside nurse.  Location of service: [place]    03/22/2019,11:10 AM  LOS: 27 days

## 2019-03-22 NOTE — Progress Notes (Signed)
[place] for Heparin Indication: Suspected PE  Not on File  Patient Measurements: Height: 5\' 5"  (165.1 cm) Weight: 139 lb 12.4 oz (63.4 kg) IBW/kg (Calculated) : 61.5 Heparin Dosing Weight: 66 kg  Vital Signs: Temp: 99.1 F (37.3 C) (05/18 1130) Temp Source: Oral (05/18 1130) BP: 99/67 (05/18 1300) Pulse Rate: 108 (05/18 1300)  Labs: Recent Labs    03/20/19 0600  03/21/19 0640 03/21/19 1552 03/22/19 0220 03/22/19 1210  HGB 8.9*  --  7.9*  --  8.2*  --   HCT 29.8*  --  27.4*  --  28.0*  --   PLT 287  --  308  --  338  --   APTT 179*  --  75*  --  106*  --   HEPARINUNFRC 1.02*   < > 0.42  --  0.63 0.64  CREATININE 1.99*   < > 1.89* 1.78* 1.79*  --    < > = values in this interval not displayed.    Estimated Creatinine Clearance: 43.9 mL/min (A) (by C-G formula based on SCr of 1.79 mg/dL (H)).  Assessment: 18 yom with complications from HEKBT-24 including acute respiratory failure s/p tracheostomy and ATN on CRRT now s/p permcath. CCM transitioned to heparin drip 2/2 suspected PE.   Pt currently receiving IV heparin 600 units/hr via peripheral line along with heparin 1000 units/hr via CRRT circuit. Heparin level remains therapeutic. Hgb low but stable, plt wnl. Noted that heparin stopped for 2 hours over night due to bloody secretions from tracheostomy in-line suctioning and spontaneous cough. No further bleeding per RN.  Goal of Therapy:  Heparin level 0.3-0.7 units/ml Monitor platelets by anticoagulation protocol: Yes   Plan:  - Continue IV heparin at 600 units/hr  - Daily CBC/HL - Monitor for bleeding very closely   Sherlon Handing, PharmD, BCPS Clinical pharmacist  **Pharmacist phone directory can now be found on [email or web address] ([email or web address]).  Listed under [place]. 03/22/2019 1:16 PM

## 2019-03-22 NOTE — Progress Notes (Signed)
Assisted tele visit to patient with wife.  Aravind Chrismer M, RN  

## 2019-03-22 NOTE — Progress Notes (Signed)
[place] for IV UFH Indication: Suspected PE  Not on File  Patient Measurements: Height: 5\' 5"  (165.1 cm) Weight: 138 lb 10.7 oz (62.9 kg) IBW/kg (Calculated) : 61.5 Heparin Dosing Weight: 66 kg  Vital Signs: Temp: 98.2 F (36.8 C) (05/18 0000) Temp Source: Axillary (05/18 0000) BP: 86/57 (05/18 0427) Pulse Rate: 106 (05/18 0427)  Labs: Recent Labs    03/20/19 0600 03/20/19 1600 03/20/19 1700 03/21/19 0640 03/21/19 1552 03/22/19 0220  HGB 8.9*  --   --  7.9*  --  8.2*  HCT 29.8*  --   --  27.4*  --  28.0*  PLT 287  --   --  308  --  338  APTT 179*  --   --  75*  --   --   HEPARINUNFRC 1.02*  --  0.46 0.42  --  0.63  CREATININE 1.99* 2.13*  --  1.89* 1.78*  --     Estimated Creatinine Clearance: 44.1 mL/min (A) (by C-G formula based on SCr of 1.78 mg/dL (H)).  Assessment: 58 yom with complications from XENMM-76 including acute respiratory failure s/p tracheostomy and ATN on CRRT now s/p permcath. CCM transitioned to heparin drip 2/2 suspected PE.   AC: Currently receiving IV heparin 600 units/hr via peripheral line along with heparin 1000 units/hr via CRRT circuit. HL remains therapeutic this AM. H/H slight trend down and Plt wnl   Today, 03/22/19   Heparin stopped for 2 hours over night due to bloody secretions from tracheostomy in-line suctioning and spontaneous cough. MD ordered infusion held and the resumed after two hours   HL which was drawn after heparin held was 0.63, therapeutic  Hgb 8.2, low but stable, plt 338    Goal of Therapy:  Heparin level 0.3-0.7 units/ml Monitor platelets by anticoagulation protocol: Yes   Plan:  - Continue IV heparin at 600 units/hr  - HL 8 hours after restart of heparin  - Daily CBC/HL - Monitor for bleeding very closely    Royetta Asal, PharmD, BCPS 03/22/2019 5:27 AM

## 2019-03-22 NOTE — Progress Notes (Signed)
Spoke with patients daughter via the phone with an interpreter and updated her. Video chat is scheduled for this afternoon

## 2019-03-22 NOTE — Progress Notes (Signed)
On call MD notified of patient with bright red bloody secretions from tracheostomy in-line suctioning and spontaneous cough. Patient noted with pink/blood tinged secretions the past two nights, but now secretions are noted as being frank bright red blood. Order received to hold IV Heparin infusion for two hours. Advised to continue Heparin via CRRT circuit and to notify MD ASAP for any worsening bleeding or signs of respiratory distress. At this time patient without signs of distress maintaining oxygen saturations greater than 95% via 40% FiO2 and 5 PEEP. Will continue to monitor.

## 2019-03-22 NOTE — Progress Notes (Signed)
PROGRESS NOTE  Jonathan [name] [id number] DOB: [birth year] DOA: 02/23/2019  PCP: Patient, No Pcp Per  Brief History/Interval Summary: 48 y.o. male [place]aker speaking, arrived from [place] on 02/13/2019 and was living in close quarters with 6 other workers. He developed increasing rrespiratory distress and was intubated for ARDS due to COVID-19 infection. He was started on CRRT due to ATN, paralyzed and prone. He was transferred to [facility] for further management. He was eventually extubated, but quickly became short of breath again and aspirated when he started vomiting. He was re intubated and ended up with tracheostomy.   Events: 02/23/2019 admitted to the ICU. 02/24/2019 started on CRRT 4/21-4/24 Proned daily 4/28 -4/30 proned daily  03/04/2019 increase in oxygen demand require paralytic for 24 hours. 03/09/2019 hematochezia from gastric aspirate  Lines and tubes: 4/21 - ETT >>5.11.2020 4/21 - RIJ CVC [facility] ED >> 4/22 L IJ HD Cath> 5/13 Permacath right  Antibiotics: 02/23/2019-425 2020 azithromycin and Rocephin 02/23/2019-02/24/2019 03/06/2019 Rocephin 5/11-5/17 Vancomycin 5/12-5/18 Zosyn 5/12-5/17 Fluconazole for candida in tracheal aspirate and recent Actemra  Consultants: Pulmonology   Subjective/Interval History: Patient noted to be awake.  Follows commands.    Assessment/Plan:  Acute Hypoxic Resp. Failure due to Acute Covid 19 Viral Illness during the ongoing 2020 Covid 19 Pandemic Medications/ARDS/Presumed Acute PE: Fever: Has been afebrile the last several days Oxygen requirements: Remains on mechanical ventilation.  40% FiO2.  Saturating in the 90s. Antibiotics: Not on any antibiotics currently Steroids: Not on steroids at this time Diuretics: Not on any diuretics Actemra: Received on 4/22 Convalescent Plasma: On 5/3 Vitamin C and Zinc: None  Ventilator management per critical care medicine.  Patient on ARDS protocol.  Patient was extubated on  5/11 and then subsequently had to be reintubated on 5/12.  Tracheostomy placed.  Completed course of antibiotics.  Not on steroids anymore.  Echocardiogram showed right-sided strain.  Patient was empirically placed on IV heparin due to concern for underlying pulmonary embolism.  We will get lower extremity Doppler studies.  Septic shock Patient had been hypotensive.  He required Levophed which has been weaned off.  Blood pressures are stable.  Acute kidney injury due to ATN Nephrology is following.  Patient is on CRRT.  Tunneled HD catheter placed on 5/13.  Anemia due to chronic illness and possible acute GI blood loss No further bleeding has been noted.  Hemoglobin is stable.  Leukocytosis Patient is afebrile.  Reason for elevated WBC is not entirely clear.  Nursing staff reports loose stools.  Discussed with Dr. Lamonte Sakai.  Low threshold to check for infection.  Diabetes mellitus type 2 Patient currently on basal insulin and sliding scale coverage.  Monitor CBGs.  Check HbA1c.  FEN Continue tube feedings.  Pressure injury on the skin of nose Local wound care.   DVT Prophylaxis: On heparin infusion  Lab Results  Component Value Date   PLT 338 03/22/2019     PUD Prophylaxis: On famotidine Code Status: Partial code Family Communication:  Wife Energy manager number [phone]. Updated daughter over the phone with interpreter assistance Disposition Plan: Not clear yet   Medications:  Scheduled: . chlorhexidine  15 mL Mouth/Throat BID  . Chlorhexidine Gluconate Cloth  6 each Topical Daily  . famotidine  20 mg Per Tube Daily  . feeding supplement (PRO-STAT SUGAR FREE 64)  60 mL Per Tube BID  . free water  100 mL Per Tube Q8H  . insulin aspart  0-15 Units Subcutaneous Q4H  . insulin aspart  6 Units Subcutaneous Q4H  . insulin glargine  15 Units Subcutaneous BID  . mouth rinse  15 mL Mouth Rinse 10 times per day  . oxyCODONE  5 mg Oral Q8H  . QUEtiapine  50 mg  Per Tube BID  . sodium chloride flush  10-40 mL Intracatheter Q12H   Continuous: .  prismasol BGK 4/2.5 300 mL/hr at 03/22/19 0235  .  prismasol BGK 4/2.5 500 mL/hr at 03/22/19 0439  . sodium chloride    . sodium chloride Stopped (03/21/19 2128)  . feeding supplement (VITAL 1.5 CAL) 1,000 mL (03/22/19 1136)  . heparin 10,000 units/ 20 mL infusion syringe 1,000 Units/hr (03/22/19 1300)  . heparin 600 Units/hr (03/22/19 1300)  . norepinephrine (LEVOPHED) Adult infusion Stopped (03/19/19 1939)  . prismasol BGK 4/2.5 1,500 mL/hr at 03/22/19 1255   YSA:YTKZS/WFUXNATF arterial line **AND** sodium chloride, sodium chloride, acetaminophen (TYLENOL) oral liquid 160 mg/5 mL, glycopyrrolate, haloperidol lactate, heparin, heparin, heparin, HYDROmorphone (DILAUDID) injection, metoprolol tartrate, midazolam, midazolam, ondansetron (ZOFRAN) IV, sodium chloride flush   Objective:  Vital Signs  Vitals:   03/22/19 1042 03/22/19 1100 03/22/19 1130 03/22/19 1200  BP:  92/71  99/74  Pulse:  (!) 112  (!) 114  Resp:  (!) 25  17  Temp:   99.1 F (37.3 C)   TempSrc:   Oral   SpO2: 98% 97%  99%  Weight:      Height:        Intake/Output Summary (Last 24 hours) at 03/22/2019 1258 Last data filed at 03/22/2019 1200 Gross per 24 hour  Intake 2369.2 ml  Output 2156 ml  Net 213.2 ml   Filed Weights   03/20/19 0500 03/21/19 0425 03/22/19 0500  Weight: 58 kg 62.9 kg 63.4 kg    General appearance: Awake alert.  In no distress Resp: Coarse breath sounds bilaterally. Cardio: S1-S2 is normal regular.  No S3-S4.  No rubs murmurs or bruit GI: Abdomen is soft.  Nontender nondistended.  Bowel sounds are present normal.  No masses organomegaly Extremities: No edema.   Neurologic: awake alert.  Follows commands   Lab Results:  Data Reviewed: I have personally reviewed following labs and imaging studies  CBC: Recent Labs  Lab 03/18/19 0220  03/18/19 1112 03/19/19 0130 03/20/19 0600 03/21/19 0640  03/22/19 0220  WBC 12.8*  --   --  15.9* 19.7* 22.6* 21.9*  HGB 7.3*   < > 9.5* 7.5* 8.9* 7.9* 8.2*  HCT 24.9*   < > 28.0* 25.1* 29.8* 27.4* 28.0*  MCV 101.6*  --   --  101.6* 101.7* 102.2* 102.9*  PLT 194  --   --  213 287 308 338   < > = values in this interval not displayed.    Basic Metabolic Panel: Recent Labs  Lab 03/18/19 0220  03/19/19 0130  03/20/19 0600 03/20/19 1600 03/21/19 0640 03/21/19 1552 03/22/19 0220  NA 137   < > 134*  134*   < > 136 134* 135 135 135  K 4.0   < > 5.3*  5.3*   < > 4.9 5.1 5.2* 4.8 4.8  CL 102   < > 98  98   < > 96* 98 98 99 98  CO2 27   < > 26  26   < > 27 23 26 27 25   GLUCOSE 91   < > 175*  176*   < > 120* 215* 146* 143* 193*  BUN 38*   < > 33*  32*   < > 36* 39* 34* 35* 36*  CREATININE 1.98*   < > 1.82*  1.81*   < > 1.99* 2.13* 1.89* 1.78* 1.79*  CALCIUM 8.5*   < > 9.1  8.9   < > 10.1 9.4 9.5 9.4 9.6  MG 2.9*  --  2.8*  --  3.4*  --  3.0*  --  3.1*  PHOS 3.1   < > 2.3*   < > 3.6 3.4 2.9 3.0 3.6   < > = values in this interval not displayed.    GFR: Estimated Creatinine Clearance: 43.9 mL/min (A) (by C-G formula based on SCr of 1.79 mg/dL (H)).  Liver Function Tests: Recent Labs  Lab 03/20/19 0600 03/20/19 1600 03/21/19 0640 03/21/19 1552 03/22/19 0220  ALBUMIN 3.5 2.9* 2.8* 3.0* 3.1*   Coagulation Profile: Recent Labs  Lab 03/16/19 0845  INR 1.2     CBG: Recent Labs  Lab 03/21/19 1938 03/22/19 0033 03/22/19 0443 03/22/19 0814 03/22/19 1121  GLUCAP 132* 201* 149* 150* 146*     Recent Results (from the past 240 hour(s))  Culture, blood (Routine X 2) w Reflex to ID Panel     Status: None   Collection Time: 03/14/19 11:45 AM  Result Value Ref Range Status   Specimen Description   Final    BLOOD RIGHT ANTECUBITAL Performed at [facility], [place] [address], [place], [place] [address]    Special Requests   Final    BOTTLES DRAWN AEROBIC ONLY Blood Culture adequate volume Performed at  [facility] [address] [place], [place] [address]    Culture   Final    NO GROWTH 5 DAYS Performed at [facility], [place] [address] [place], [place] [address]    Report Status 03/19/2019 FINAL  Final  Culture, blood (Routine X 2) w Reflex to ID Panel     Status: None   Collection Time: 03/14/19 11:50 AM  Result Value Ref Range Status   Specimen Description   Final    BLOOD RIGHT HAND Performed at [facility] [address], [place], [place] [address]    Special Requests   Final    BOTTLES DRAWN AEROBIC ONLY Blood Culture adequate volume Performed at [facility] [address], [place], [place] [address]    Culture   Final    NO GROWTH 5 DAYS Performed at [facility], [place] [address] [place], [place] [address]    Report Status 03/19/2019 FINAL  Final      Radiology Studies: Dg Chest Port 1 View  Result Date: 03/21/2019 CLINICAL DATA:  Acute respiratory failure EXAM: PORTABLE CHEST 1 VIEW COMPARISON:  03/18/2019 FINDINGS: Tracheostomy in satisfactory position. Right IJ venous catheter terminates in the right atrium. Left IJ venous catheter has been removed. Enteric tube terminates in the gastric cardia. Multifocal patchy opacities, right upper lobe predominant, grossly in pains. No pleural effusion or pneumothorax. The heart is normal in size. IMPRESSION: Tracheostomy in satisfactory position. Additional support apparatus as above. Multifocal pneumonia, grossly unchanged. Electronically Signed   By: Julian Hy M.D.   On: 03/21/2019 05:20       LOS: 54 days   Rhyatt Muska [employer] on [email or web address]  03/22/2019, 12:58 PM

## 2019-03-22 NOTE — Progress Notes (Signed)
Patient is video chatting with family at this time via [facility].

## 2019-03-22 NOTE — Progress Notes (Signed)
VASCULAR LAB PRELIMINARY  PRELIMINARY  PRELIMINARY  PRELIMINARY  Bilateral lower extremity venous duplex completed.    Preliminary report:  See CV proc for preliminary results.  Kleigh Hoelzer, RVT 03/22/2019, 4:08 PM

## 2019-03-22 NOTE — Progress Notes (Signed)
Physical Therapy Treatment Patient Details Name: Jonathan [name] MRN: [id number] DOB: June 15, 1971 Today's Date: 03/22/2019    History of Present Illness 48 y.o. male admitted on 02/23/19 with AMS, O2 sats in the ED were 50%.  Pt dx with COVID 19 and resultant ARDS.  He was intubated 4/21 adn remains intubated.  His course is complicated by acute kidney injury requiring CVVHD.      PT Comments    The patient  Is awake,  Follows directions via interpreter. Noted R shoulder weakness vs. Left. Also right LE is slioghtly weaker, able to lift both legs from bed with right not as high, active dorsiflexion and plantar flexion, slight tightness of heel cords with grimacing when dorsiflexing. Patient placed in  Bed / chair position and tolerated well.  HR 114-125, On 40% FiO2 , peep 5, saturation 89-95%,  BP  100/70(78), 85/68 (75) seated position, 93/72(79). Patient tolerated well.  Continue with mobility, attempt sitting next visit if able.   Follow Up Recommendations  LTACH;CIR     Equipment Recommendations       Recommendations for Other Services       Precautions / Restrictions Precautions Precaution Comments: R IJ HD catheter, zon vent/trach    Mobility  Bed Mobility Overal bed mobility: Needs Assistance             General bed mobility comments: scoot up in bed  , pt.  flexed knees and did assist with 2 persons and bed  pad.  Transfers                 General transfer comment: NT, placed in bed chair position for further extremity assessment.  Ambulation/Gait                 Stairs             Wheelchair Mobility    Modified Rankin (Stroke Patients Only)       Balance                                            Cognition Arousal/Alertness: Awake/alert Behavior During Therapy: WFL for tasks assessed/performed Overall Cognitive Status: Within Functional Limits for tasks assessed                                  General Comments: Jonathan [name] [id number] interreter, patient communicated well via interpreter, follows commands.      Exercises General Exercises - Lower Extremity Ankle Circles/Pumps: AROM;Both;10 reps Short Arc Quad: AROM;AAROM;Both;10 reps;Seated    General Comments        Pertinent Vitals/Pain Pain Assessment: Faces Faces Pain Scale: Hurts little more Pain Location: when ankles stretched Pain Descriptors / Indicators: Grimacing Pain Intervention(s): Monitored during session    Home Living                      Prior Function            PT Goals (current goals can now be found in the care plan section) Progress towards PT goals: Progressing toward goals    Frequency    Min 3X/week      PT Plan Current plan remains appropriate;Frequency needs to be updated    Co-evaluation PT/OT/SLP Co-Evaluation/Treatment: Yes Reason for Co-Treatment: Complexity of the patient's impairments (multi-system  involvement);For patient/therapist safety PT goals addressed during session: Mobility/safety with mobility OT goals addressed during session: ADL's and self-care      AM-PAC PT "6 Clicks" Mobility   Outcome Measure  Help needed turning from your back to your side while in a flat bed without using bedrails?: Total Help needed moving from lying on your back to sitting on the side of a flat bed without using bedrails?: Total Help needed moving to and from a bed to a chair (including a wheelchair)?: Total Help needed standing up from a chair using your arms (e.g., wheelchair or bedside chair)?: Total Help needed to walk in hospital room?: Total Help needed climbing 3-5 steps with a railing? : Total 6 Click Score: 6    End of Session Equipment Utilized During Treatment: Oxygen(vent at 40% FiO2, Peep 5) Activity Tolerance: Patient tolerated treatment well Patient left: in bed;with nursing/sitter in room Nurse Communication: Mobility status PT Visit Diagnosis:  Muscle weakness (generalized) (M62.81);Difficulty in walking, not elsewhere classified (R26.2)     Time: 4098-1191 PT Time Calculation (min) (ACUTE ONLY): 30 min  Charges:  $Therapeutic Exercise: 8-22 mins                     Jonathan [name] PT Acute Rehabilitation Services Pager [phone] Office [phone]    Jonathan [name] 03/22/2019, 3:06 PM

## 2019-03-23 LAB — GLUCOSE, CAPILLARY
Glucose-Capillary: 142 mg/dL — ABNORMAL HIGH (ref 70–99)
Glucose-Capillary: 163 mg/dL — ABNORMAL HIGH (ref 70–99)
Glucose-Capillary: 166 mg/dL — ABNORMAL HIGH (ref 70–99)
Glucose-Capillary: 172 mg/dL — ABNORMAL HIGH (ref 70–99)
Glucose-Capillary: 206 mg/dL — ABNORMAL HIGH (ref 70–99)
Glucose-Capillary: 216 mg/dL — ABNORMAL HIGH (ref 70–99)

## 2019-03-23 LAB — RENAL FUNCTION PANEL
Albumin: 3.2 g/dL — ABNORMAL LOW (ref 3.5–5.0)
Albumin: 3.3 g/dL — ABNORMAL LOW (ref 3.5–5.0)
Anion gap: 12 (ref 5–15)
Anion gap: 10 (ref 5–15)
BUN: 37 mg/dL — ABNORMAL HIGH (ref 6–20)
BUN: 37 mg/dL — ABNORMAL HIGH (ref 6–20)
CO2: 26 mmol/L (ref 22–32)
CO2: 27 mmol/L (ref 22–32)
Calcium: 10.1 mg/dL (ref 8.9–10.3)
Calcium: 10.2 mg/dL (ref 8.9–10.3)
Chloride: 97 mmol/L — ABNORMAL LOW (ref 98–111)
Chloride: 99 mmol/L (ref 98–111)
Creatinine, Ser: 2.04 mg/dL — ABNORMAL HIGH (ref 0.61–1.24)
Creatinine, Ser: 1.96 mg/dL — ABNORMAL HIGH (ref 0.61–1.24)
GFR calc Af Amer: 43 mL/min — ABNORMAL LOW (ref >60)
GFR calc Af Amer: 46 mL/min — ABNORMAL LOW (ref >60)
GFR calc non Af Amer: 37 mL/min — ABNORMAL LOW (ref >60)
GFR calc non Af Amer: 39 mL/min — ABNORMAL LOW (ref >60)
Glucose, Bld: 166 mg/dL — ABNORMAL HIGH (ref 70–99)
Glucose, Bld: 148 mg/dL — ABNORMAL HIGH (ref 70–99)
Phosphorus: 2.8 mg/dL (ref 2.5–4.6)
Phosphorus: 2.5 mg/dL (ref 2.5–4.6)
Potassium: 4.8 mmol/L (ref 3.5–5.1)
Potassium: 4.6 mmol/L (ref 3.5–5.1)
Sodium: 135 mmol/L (ref 135–145)
Sodium: 136 mmol/L (ref 135–145)

## 2019-03-23 LAB — CBC
HCT: 31 % — ABNORMAL LOW (ref 39.0–52.0)
Hemoglobin: 8.7 g/dL — ABNORMAL LOW (ref 13.0–17.0)
MCH: 28.7 pg (ref 26.0–34.0)
MCHC: 28.1 g/dL — ABNORMAL LOW (ref 30.0–36.0)
MCV: 102.3 fL — ABNORMAL HIGH (ref 80.0–100.0)
Platelets: 425 10*3/uL — ABNORMAL HIGH (ref 150–400)
RBC: 3.03 MIL/uL — ABNORMAL LOW (ref 4.22–5.81)
RDW: 18.2 % — ABNORMAL HIGH (ref 11.5–15.5)
WBC: 22.6 10*3/uL — ABNORMAL HIGH (ref 4.0–10.5)
nRBC: 2.1 % — ABNORMAL HIGH (ref 0.0–0.2)

## 2019-03-23 LAB — MAGNESIUM: Magnesium: 3.2 mg/dL — ABNORMAL HIGH (ref 1.7–2.4)

## 2019-03-23 LAB — APTT: aPTT: 100 seconds — ABNORMAL HIGH (ref 24–36)

## 2019-03-23 LAB — HEMOGLOBIN A1C
Hgb A1c MFr Bld: 5.5 % (ref 4.8–5.6)
Mean Plasma Glucose: 111.15 mg/dL

## 2019-03-23 LAB — HEPARIN LEVEL (UNFRACTIONATED): Heparin Unfractionated: 0.67 IU/mL (ref 0.30–0.70)

## 2019-03-23 MED ORDER — QUETIAPINE FUMARATE 25 MG PO TABS
25.0000 mg | ORAL_TABLET | Freq: Two times a day (BID) | ORAL | Status: AC
  Administered 2019-03-23 – 2019-03-24 (×2): 25 mg
  Filled 2019-03-23 (×2): qty 1

## 2019-03-23 MED ORDER — ACETAMINOPHEN 160 MG/5ML PO SOLN
650.0000 mg | Freq: Four times a day (QID) | ORAL | Status: DC | PRN
  Administered 2019-03-24 – 2019-03-28 (×3): 650 mg
  Filled 2019-03-23 (×3): qty 20.3

## 2019-03-23 NOTE — Progress Notes (Signed)
Assisted tele visit to patient with wife who lives in [place].  Lauren Modisette, Hazle Nordmann, RN

## 2019-03-23 NOTE — Progress Notes (Signed)
Patient placed back on full support via vent due to desaturation and tachypnea.  RT to monitor.

## 2019-03-23 NOTE — Progress Notes (Addendum)
Occupational Therapy Treatment Patient Details Name: Jonathan [name] MRN: [id number] DOB: May 29, 1971 Today's Date: 03/23/2019    History of present illness 48 y.o. male admitted on 02/23/19 with AMS, O2 sats in the ED were 50%.  Pt dx with COVID 19 and resultant ARDS.  He was intubated 4/21 adn remains intubated.  His course is complicated by acute kidney injury requiring CVVHD.     OT comments  Pt suctioned at beginning of session. Nsg helped to interpret during session. Pt seen at bed level on TC; 4L. VSS throughout session. Increased RR with exercise/activity, then lowered appropriately with rest. Pt works very hard and is engaged throughout the session. Pt issued a spainish communication picture board to help with communicating needs. Pt in "chair like" position playing TicTacToe with therapist after exercise. Pt nodding "yes" that he enjoyed playing the game. Making steady progress. Will attempt to progress mobility to EOB tomorrow. Feel pt is an excellent CIR candidate.   Follow Up Recommendations  CIR    Equipment Recommendations  3 in 1 bedside commode    Recommendations for Other Services Rehab consult    Precautions / Restrictions Precautions Precautions: Other (comment) Precaution Comments: R IJ HD catheter, on vent/trach       Mobility Bed Mobility - moved into chair position                  Transfers                      Balance        maintained midline trunk control                                   ADL either performed or assessed with clinical judgement   ADL   Eating/Feeding: NPO                                           Vision   Additional Comments: complains of blurry vision - discussed possible use of artificial tears with nsg   Perception     Praxis      Cognition Arousal/Alertness: Awake/alert Behavior During Therapy: WFL for tasks assessed/performed Overall Cognitive Status: Difficult  to assess                                          Exercises Exercises: General Upper Extremity;Low Level/ICU General Exercises - Upper Extremity Shoulder Flexion: AROM;AAROM;Both;15 reps;Seated Shoulder ABduction: AROM;AAROM;Both;15 reps;Seated Elbow Flexion: AROM;AAROM;Both;15 reps;Seated Elbow Extension: AROM;AAROM;Both;15 reps;Seated Wrist Extension: PROM;Both;10 reps Digit Composite Flexion: Strengthening;Both;Squeeze ball;20 reps General Exercises - Lower Extremity Ankle Circles/Pumps: AROM;Both;10 reps Short Arc Quad: AROM;AAROM;Both;10 reps;Seated Heel Slides: Both;AROM;Strengthening;15 reps;Supine Hip ABduction/ADduction: AROM;Strengthening;Both;15 reps;Supine;Seated Low Level/ICU Exercises Stabilized Bridging: Strengthening;Both;5 reps Other Exercises Other Exercises: shoulder rolls forward/backward Other Exercises: PNF diagonals L shoulder  Cervical rotation R/L x 10   Shoulder Instructions       General Comments  Played TicTacToe at end of session using clipboard. Pt making good strategic choices during game. Laughing when he beat this therapist. Appeared to really enjoy game.    Pertinent Vitals/ Pain       Pain Assessment: Faces Faces Pain Scale: Hurts little more  Pain Location: general discomfort wth BUE ROM(B shoulder with FF; ? impingement) Pain Descriptors / Indicators: Grimacing Pain Intervention(s): Limited activity within patient's tolerance;Repositioned  Home Living                                          Prior Functioning/Environment              Frequency  Min 3X/week        Progress Toward Goals  OT Goals(current goals can now be found in the care plan section)  Progress towards OT goals: Progressing toward goals  Acute Rehab OT Goals Patient Stated Goal: nodds head to "to get stronger" OT Goal Formulation: With patient Time For Goal Achievement: 04/05/19 Potential to Achieve Goals: Good  Plan  Discharge plan remains appropriate    Co-evaluation                 AM-PAC OT "6 Clicks" Daily Activity     Outcome Measure   Help from another person eating meals?: Total Help from another person taking care of personal grooming?: A Lot Help from another person toileting, which includes using toliet, bedpan, or urinal?: Total Help from another person bathing (including washing, rinsing, drying)?: A Lot Help from another person to put on and taking off regular upper body clothing?: Total Help from another person to put on and taking off regular lower body clothing?: Total 6 Click Score: 8    End of Session Equipment Utilized During Treatment: Oxygen(4L)  OT Visit Diagnosis: Other abnormalities of gait and mobility (R26.89);Muscle weakness (generalized) (M62.81);Pain Pain - part of body: Shoulder(Bwith ROM)   Activity Tolerance Patient tolerated treatment well   Patient Left in bed;with call bell/phone within reach;with nursing/sitter in room   Nurse Communication Mobility status;Other (comment)(encourage chair position; use of squeeze ball)        Time: 1550-1630 OT Time Calculation (min): 40 min  Charges: OT General Charges $OT Visit: 1 Visit OT Treatments $Therapeutic Activity: 8-22 mins $Neuromuscular Re-education: 23-37 mins  Maurie Boettcher, OT/L   Acute OT Clinical Specialist [place] Pager [phone] Office [phone]    [facility] 03/23/2019, 4:59 PM

## 2019-03-23 NOTE — Consult Note (Signed)
[place] Nurse wound follow up I spoke with the patient's primary RN via remote camera in room.  He had just very recently (in the last 2 hours) changed the dressings. Wound type: Unstageable PIs Measurement: to be provided by the bedside RNs Wound bed: soft and black Drainage (amount, consistency, odor) none Periwound:intact Dressing procedure/placement/frequency: Continue the daily use of hydrocolloid dressings to the bilateral cheek wounds. Monitor the wound area(s) for worsening of condition such as: Signs/symptoms of infection,  Increase in size,  Development of or worsening of odor, Development of pain, or increased pain at the affected locations.  Notify the medical team if any of these develop.  Thank you for the consult.  Discussed plan of care with bedside nurse.  [place] nurse will not follow at this time.  Please re-consult the [place] team if needed.  Val Riles, RN, MSN, CWOCN, CNS-BC, pager [phone]

## 2019-03-23 NOTE — Progress Notes (Addendum)
Nutrition Follow-up RD working remotely.  DOCUMENTATION CODES:   Not applicable  INTERVENTION:   Continue TF via NGT:   Vital 1.5 at 45 ml/h  Pro-stat 60 ml BID  Provides 2020 kcal, 133 gm protein, 825 ml free water daily  NUTRITION DIAGNOSIS:   Inadequate oral intake related to inability to eat as evidenced by NPO status.  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 Admitted with fever, cough, AMS, and respiratory distress. COVID-19 positive. S/P convalescent plasma transfusion on 5/3. Transferred to [place] on 5/6.  Patient has been on vent support, on trach collar this morning.  Remains on CRRT. Pressors are off.   Labs reviewed. BUN 37 (H), creatinine 2.04 (H), magnesium 3.2 (H) CBG's: [phone] Medications reviewed and include Novolog, Lantus.  Currently receiving Vital 1.5 at 45 ml/h with Pro-stat 60 ml BID to provide 2020 kcal, 133 gm protein, 825 ml free water daily. Also receiving free water flushes 100 ml every 8 hours.  Weight is currently 17.2 kg below admission weight. I/O net negative 10.6 L.  Diet Order:   Diet Order            Diet NPO time specified Except for: Sips with Meds  Diet effective midnight              EDUCATION NEEDS:   No education needs have been identified at this time  Skin:  Skin Assessment: Skin Integrity Issues: Skin Integrity Issues:: DTI, Stage II DTI: L and R side of face Stage II: nose  Last BM:  5/19  Height:   Ht Readings from Last 1 Encounters:  03/18/19 _0  (1.651 m)    Weight:   Wt Readings from Last 1 Encounters:  03/23/19 60.5 kg    Ideal Body Weight:  61.8 kg  BMI:  Body mass index is 22.2 kg/m.  Estimated Nutritional Needs:   Kcal:  4270-6237  Protein:   130-150 gm  Fluid:  1 L +UOP    Molli Barrows, RD, LDN, [place] Pager [phone] After Hours Pager [phone]

## 2019-03-23 NOTE — Progress Notes (Signed)
Patient able to video chat with family for 20 minutes.  ipad translator used to answer family questions.  Attending MD was also available and updated family and answered all questions.  Patients wife contacted via email at [email or web address]

## 2019-03-23 NOTE — Progress Notes (Signed)
Subjective:  No problems overnight, IV hep 1000 u/ hr per CVVH and 600 u/hr systemic, keeping even, not on pressor support   Objective Vital signs in last 24 hours: Vitals:   03/23/19 0330 03/23/19 0400 03/23/19 0430 03/23/19 0500  BP: 113/83 107/80 93/77 98/77   Pulse: (!) 104 99 (!) 111 (!) 110  Resp: (!) 30 (!) 33 (!) 29 (!) 31  Temp:  99.1 F (37.3 C)    TempSrc:  Axillary    SpO2: 100% 100% 98% 98%  Weight:      Height:       Weight change:   Intake/Output Summary (Last 24 hours) at 03/23/2019 0548 Last data filed at 03/23/2019 0540 Gross per 24 hour  Intake 2142.98 ml  Output 2018 ml  Net 124.98 ml   Physical Exam: Trach, NG tube , R chest HD cath Pt awake and alert, responsive Chest cta bilat Cor reg  Abd soft ntnd no ascites  Ext no edema LE's or UE's  BP's 100/ 88   CXR 5/17 diffuse stable infiltrates  Assessment/ Plan: Pt is a 48 y.o. yo male who was admitted on 02/23/2019 with anuric AKI due to ATN in the setting of COVID   Assessment/Plan: 1. AKI-  On CRRT since 4/21. Now is off pressors w/ good BP's.  NO UOP so no signs of renal recovery yet and still requiring RRT. Now has a tunneled HD cath.  If comes off vent could possibly be transferred to [facility] to continue with intermittent HD.   2. HTN/vol- no edema, down 35-40 lbs from admit. Will keep + w/ CRRT.  3. Anemia- hgb low- supportive care for now- no iron in the setting of sepsis - transfuse as needed  4. ID-  covid positive - also vanc and zosyn  5. VDRF-  O2 req has dec but still on vent via trach now, 40% FiO2 6.  [place]- K is fine on all 4 k bath- no change , got phos 5/13-and 5/15  - calcium OK as well   [facility] 03/23/2019, 7:29 AM    Labs: Basic Metabolic Panel: Recent Labs  Lab 03/21/19 1552 03/22/19 0220 03/22/19 1555  NA 135 135 135  K 4.8 4.8 5.0  CL 99 98 98  CO2 27 25 26   GLUCOSE 143* 193* 103*  BUN 35* 36* 34*  CREATININE 1.78* 1.79* 1.93*  CALCIUM  9.4 9.6 10.0  PHOS 3.0 3.6 3.0   Liver Function Tests: Recent Labs  Lab 03/21/19 1552 03/22/19 0220 03/22/19 1555  ALBUMIN 3.0* 3.1* 3.2*   No results for input(s): LIPASE, AMYLASE in the last 168 hours. No results for input(s): AMMONIA in the last 168 hours. CBC: Recent Labs  Lab 03/18/19 0220  03/19/19 0130 03/20/19 0600 03/21/19 0640 03/22/19 0220  WBC 12.8*  --  15.9* 19.7* 22.6* 21.9*  HGB 7.3*   < > 7.5* 8.9* 7.9* 8.2*  HCT 24.9*   < > 25.1* 29.8* 27.4* 28.0*  MCV 101.6*  --  101.6* 101.7* 102.2* 102.9*  PLT 194  --  213 287 308 338   < > = values in this interval not displayed.   Cardiac Enzymes: No results for input(s): CKTOTAL, CKMB, CKMBINDEX, TROPONINI in the last 168 hours. CBG: Recent Labs  Lab 03/22/19 1121 03/22/19 1545 03/22/19 2037 03/23/19 0003 03/23/19 0322  GLUCAP 146* 114* 166* 172* 206*    Iron Studies: No results for input(s): IRON, TIBC, TRANSFERRIN, FERRITIN in the last  72 hours. Studies/Results: Vas [place] Lower Extremity Venous (dvt)  Result Date: 03/22/2019  Lower Venous Study Indications: SOB, and Covid-19.  Limitations: Ventilator, boots. Comparison Study: No prior study on file for comparison Performing Technologist: Sharion Dove RVS  Examination Guidelines: A complete evaluation includes B-mode imaging, spectral Doppler, color Doppler, and power Doppler as needed of all accessible portions of each vessel. Bilateral testing is considered an integral part of a complete examination. Limited examinations for reoccurring indications may be performed as noted.  +---------+---------------+---------+-----------+----------+-------------------+ RIGHT    CompressibilityPhasicitySpontaneityPropertiesSummary             +---------+---------------+---------+-----------+----------+-------------------+ CFV      Full           Yes      Yes                                       +---------+---------------+---------+-----------+----------+-------------------+ SFJ      Full                                                             +---------+---------------+---------+-----------+----------+-------------------+ FV Prox  Full                                                             +---------+---------------+---------+-----------+----------+-------------------+ FV Mid   Full                                                             +---------+---------------+---------+-----------+----------+-------------------+ FV DistalFull                                                             +---------+---------------+---------+-----------+----------+-------------------+ PFV      Full                                                             +---------+---------------+---------+-----------+----------+-------------------+ POP      Full           Yes      Yes                                      +---------+---------------+---------+-----------+----------+-------------------+ PTV  Not all segments                                                          visualized          +---------+---------------+---------+-----------+----------+-------------------+ PERO                                                  Not visualized      +---------+---------------+---------+-----------+----------+-------------------+   +---------+---------------+---------+-----------+----------+-------------------+ LEFT     CompressibilityPhasicitySpontaneityPropertiesSummary             +---------+---------------+---------+-----------+----------+-------------------+ CFV      Full                                                             +---------+---------------+---------+-----------+----------+-------------------+ SFJ      Full                                                              +---------+---------------+---------+-----------+----------+-------------------+ FV Prox  Full                                                             +---------+---------------+---------+-----------+----------+-------------------+ FV Mid   Full                                                             +---------+---------------+---------+-----------+----------+-------------------+ FV DistalFull                                                             +---------+---------------+---------+-----------+----------+-------------------+ PFV      Full                                                             +---------+---------------+---------+-----------+----------+-------------------+ POP      Full                                                             +---------+---------------+---------+-----------+----------+-------------------+  PTV      Full                                         Not all segments                                                          visualized          +---------+---------------+---------+-----------+----------+-------------------+ PERO                                                  Not visualized      +---------+---------------+---------+-----------+----------+-------------------+     Summary: Right: There is no evidence of deep vein thrombosis in the lower extremity. However, portions of this examination were limited- see technologist comments above. Left: There is no evidence of deep vein thrombosis in the lower extremity. However, portions of this examination were limited- see technologist comments above.  *See table(s) above for measurements and observations. Electronically signed by Monica Martinez MD on 03/22/2019 at 6:11:19 PM.    Final    Medications: Infusions: .  prismasol BGK 4/2.5 300 mL/hr at 03/22/19 2056  .  prismasol BGK 4/2.5 500 mL/hr at 03/23/19 0321  . sodium chloride    . sodium chloride  Stopped (03/21/19 2128)  . feeding supplement (VITAL 1.5 CAL) 45 mL/hr at 03/22/19 1600  . heparin 10,000 units/ 20 mL infusion syringe 1,000 Units/hr (03/23/19 0024)  . heparin 600 Units/hr (03/23/19 0500)  . norepinephrine (LEVOPHED) Adult infusion Stopped (03/19/19 1939)  . prismasol BGK 4/2.5 1,500 mL/hr at 03/23/19 0340    Scheduled Medications: . chlorhexidine  15 mL Mouth/Throat BID  . Chlorhexidine Gluconate Cloth  6 each Topical Daily  . famotidine  20 mg Per Tube Daily  . feeding supplement (PRO-STAT SUGAR FREE 64)  60 mL Per Tube BID  . free water  100 mL Per Tube Q8H  . insulin aspart  0-15 Units Subcutaneous Q4H  . insulin aspart  6 Units Subcutaneous Q4H  . insulin glargine  15 Units Subcutaneous BID  . mouth rinse  15 mL Mouth Rinse 10 times per day  . oxyCODONE  5 mg Oral Q8H  . QUEtiapine  50 mg Per Tube BID  . sodium chloride flush  10-40 mL Intracatheter Q12H    have reviewed scheduled and prn medications.

## 2019-03-23 NOTE — Progress Notes (Signed)
  PT Cancellation Note  Patient Details Name: Jonathan [name] MRN: [id number] DOB: [birth year]   Cancelled Treatment:    Reason Eval/Treat Not Completed: Medical issues which prohibited therapy, Per RN, hold on sitting EOB today. Weaned to [facility] and had resp. Distress when placed on Bedpan. Plan progressing next visit as  Able.   [name] 03/23/2019, 2:10 PM [place] Pager [phone] Office [phone]

## 2019-03-23 NOTE — Progress Notes (Signed)
Rehab Admissions Coordinator Note:  Patient was screened by Michel Santee, PT, DPT for appropriateness for an Inpatient Acute Rehab Consult. Note pt currently requiring CRRT and with profound debility limiting mobility.  Will continue to follow at a distance to determine best venue for rehab needs when pt more medically stable and better able to tolerate therapy.   Shann Medal, PT, DPT Admissions Coordinator [phone] 03/23/19  11:18 AM

## 2019-03-23 NOTE — Progress Notes (Addendum)
PROGRESS NOTE  Jonathan [name] GUR:[id number] DOB: Feb 27, 1971 DOA: 02/23/2019  PCP: Patient, No Pcp Per  Brief History/Interval Summary: 48 y.o. male [place] speaking, arrived from [place] on 02/13/2019 and was living in close quarters with 6 other workers. He developed increasing rrespiratory distress and was intubated for ARDS due to COVID-19 infection. He was started on CRRT due to ATN, paralyzed and prone. He was transferred to [place] for further management. He was eventually extubated, but quickly became short of breath again and aspirated when he started vomiting. He was re intubated and ended up with tracheostomy.   Events: 02/23/2019 admitted to the ICU. 02/24/2019 started on CRRT 4/21-4/24 Proned daily 4/28 -4/30 proned daily  03/04/2019 increase in oxygen demand require paralytic for 24 hours. 03/09/2019 hematochezia from gastric aspirate  Lines and tubes: 4/21 - ETT >>5.11.2020 4/21 - RIJ CVC 4/22 L IJ HD Cath> 5/13 Permacath right  Antibiotics: 02/23/2019-425 2020 azithromycin and Rocephin 02/23/2019-02/24/2019 03/06/2019 Rocephin 5/11-5/17 Vancomycin 5/12-5/18 Zosyn 5/12-5/17 Fluconazole for candida in tracheal aspirate and recent Actemra  Consultants: Pulmonology   Subjective/Interval History: Patient noted to be awake.  Following commands.  No overnight events per nursing staff.    Assessment/Plan:  Acute Hypoxic Resp. Failure due to Acute Covid 19 Viral Illness during the ongoing 2020 Covid 19 Pandemic Medications/ARDS/Presumed Acute PE  Fever: Has been afebrile the last several days Oxygen requirements: Remains on mechanical ventilation.  40% FiO2.  Saturating in the 90s. Antibiotics: Not on any antibiotics currently Steroids: Not on steroids at this time Diuretics: Not on any diuretics Actemra: Received on 4/22 Convalescent Plasma: On 5/3 Vitamin C and Zinc: None  She seems to be improving from a respiratory standpoint.  Ventilator being  managed by critical care medicine.  Patient to be tried on trach collar today.  Patient was extubated on 5/11 and then subsequently had to be reintubated on 5/12.  Tracheostomy placed.  Completed course of antibiotics.  Not on steroids anymore.  Patient then had an acute episode of hypoxia with respiratory decompensation.  Echocardiogram showed right-sided strain.  Patient was empiricaly placed on IV heparin due to concern for underlying pulmonary embolism.  Lower extremity Doppler studies negative.  Continue heparin for now.   Septic shock Patient being weaned off of pressors.  Currently stable.  Acute kidney injury due to ATN Nephrology is following.  Patient is on CRRT.  Tunneled HD catheter placed on 5/13.  Has not made any urine.  Still waiting on renal recovery.  Patient will most likely need intermittent hemodialysis once he is off of the ventilator.  Anemia due to chronic illness and possible acute GI blood loss No further bleeding has been noted.  Hemoglobin is stable.  Leukocytosis Patient is afebrile.  Reason for elevated WBC is not entirely clear.  Due to reports of loose stools stool studies were sent and was negative for C. difficile.  WBC remains elevated.  Patient remains afebrile.  Continue to monitor for now.    Diabetes mellitus type 2 Patient currently on basal insulin and sliding scale coverage.  Monitor CBGs.  HbA1c 5.5.    FEN Continue tube feedings.  Pressure injury on the skin of nose Local wound care.   DVT Prophylaxis: On heparin infusion  Lab Results  Component Value Date   PLT 425 (H) 03/23/2019     PUD Prophylaxis: On famotidine Code Status: Partial code Family Communication:  Wife Energy manager number [phone]. Updated daughter over the phone with interpreter assistance Disposition  Plan: Not clear yet   Medications:  Scheduled: . chlorhexidine  15 mL Mouth/Throat BID  . Chlorhexidine Gluconate Cloth  6 each Topical Daily  .  famotidine  20 mg Per Tube Daily  . feeding supplement (PRO-STAT SUGAR FREE 64)  60 mL Per Tube BID  . free water  100 mL Per Tube Q8H  . insulin aspart  0-15 Units Subcutaneous Q4H  . insulin aspart  6 Units Subcutaneous Q4H  . insulin glargine  15 Units Subcutaneous BID  . mouth rinse  15 mL Mouth Rinse 10 times per day  . oxyCODONE  5 mg Oral Q8H  . QUEtiapine  25 mg Per Tube BID  . sodium chloride flush  10-40 mL Intracatheter Q12H   Continuous: .  prismasol BGK 4/2.5 300 mL (03/23/19 1343)  .  prismasol BGK 4/2.5 500 mL (03/23/19 1343)  . sodium chloride    . sodium chloride Stopped (03/21/19 2128)  . feeding supplement (VITAL 1.5 CAL) 45 mL/hr at 03/22/19 1600  . heparin 10,000 units/ 20 mL infusion syringe 1,000 Units/hr (03/23/19 1012)  . heparin 600 Units/hr (03/23/19 1400)  . norepinephrine (LEVOPHED) Adult infusion Stopped (03/19/19 1939)  . prismasol BGK 4/2.5 1,500 mL/hr at 03/23/19 1344   LAG:TXMIW/OEHOZYYQ arterial line **AND** sodium chloride, sodium chloride, acetaminophen (TYLENOL) oral liquid 160 mg/5 mL, glycopyrrolate, haloperidol lactate, heparin, heparin, heparin, HYDROmorphone (DILAUDID) injection, metoprolol tartrate, midazolam, midazolam, ondansetron (ZOFRAN) IV, sodium chloride flush   Objective:  Vital Signs  Vitals:   03/23/19 1200 03/23/19 1300 03/23/19 1400 03/23/19 1500  BP: 107/81 113/81 109/78 114/82  Pulse: (!) 118 (!) 117 (!) 115 (!) 113  Resp: (!) 29 (!) 33 (!) 29 (!) 30  Temp: 98.7 F (37.1 C)     TempSrc: Oral     SpO2: 97% 100% 96% 98%  Weight:      Height:        Intake/Output Summary (Last 24 hours) at 03/23/2019 1524 Last data filed at 03/23/2019 1500 Gross per 24 hour  Intake 1787.96 ml  Output 1335 ml  Net 452.96 ml   Filed Weights   03/21/19 0425 03/22/19 0500 03/23/19 0500  Weight: 62.9 kg 63.4 kg 60.5 kg   General appearance: Awake alert.  In no distress Resp: Coarse breath sounds bilaterally.  No wheezing rales or  rhonchi Cardio: S1-S2 is normal regular.  No S3-S4.  No rubs murmurs or bruit GI: Abdomen is soft.  Nontender nondistended.  Bowel sounds are present normal.  No masses organomegaly Extremities: No edema.  Full range of motion of lower extremities. Neurologic:  No focal neurological deficits.    Lab Results:  Data Reviewed: I have personally reviewed following labs and imaging studies  CBC: Recent Labs  Lab 03/19/19 0130 03/20/19 0600 03/21/19 0640 03/22/19 0220 03/23/19 0615  WBC 15.9* 19.7* 22.6* 21.9* 22.6*  HGB 7.5* 8.9* 7.9* 8.2* 8.7*  HCT 25.1* 29.8* 27.4* 28.0* 31.0*  MCV 101.6* 101.7* 102.2* 102.9* 102.3*  PLT 213 287 308 338 425*    Basic Metabolic Panel: Recent Labs  Lab 03/19/19 0130  03/20/19 0600  03/21/19 0640 03/21/19 1552 03/22/19 0220 03/22/19 1555 03/23/19 0615  NA 134*  134*   < > 136   < > 135 135 135 135 135  K 5.3*  5.3*   < > 4.9   < > 5.2* 4.8 4.8 5.0 4.8  CL 98  98   < > 96*   < > 98 99 98 98 97*  CO2 26  26   < > 27   < > 26 27 25 26 26   GLUCOSE 175*  176*   < > 120*   < > 146* 143* 193* 103* 166*  BUN 33*  32*   < > 36*   < > 34* 35* 36* 34* 37*  CREATININE 1.82*  1.81*   < > 1.99*   < > 1.89* 1.78* 1.79* 1.93* 2.04*  CALCIUM 9.1  8.9   < > 10.1   < > 9.5 9.4 9.6 10.0 10.1  MG 2.8*  --  3.4*  --  3.0*  --  3.1*  --  3.2*  PHOS 2.3*   < > 3.6   < > 2.9 3.0 3.6 3.0 2.8   < > = values in this interval not displayed.    GFR: Estimated Creatinine Clearance: 37.9 mL/min (A) (by C-G formula based on SCr of 2.04 mg/dL (H)).  Liver Function Tests: Recent Labs  Lab 03/21/19 0640 03/21/19 1552 03/22/19 0220 03/22/19 1555 03/23/19 0615  ALBUMIN 2.8* 3.0* 3.1* 3.2* 3.2*    CBG: Recent Labs  Lab 03/22/19 2037 03/23/19 0003 03/23/19 0322 03/23/19 0808 03/23/19 1136  GLUCAP 166* 172* 206* 166* 216*     Recent Results (from the past 240 hour(s))  Culture, blood (Routine X 2) w Reflex to ID Panel     Status: None    Collection Time: 03/14/19 11:45 AM  Result Value Ref Range Status   Specimen Description   Final    BLOOD RIGHT ANTECUBITAL Performed at [facility], [place] [address], [place], [place] [address]    Special Requests   Final    BOTTLES DRAWN AEROBIC ONLY Blood Culture adequate volume Performed at [place] [address] [place], [place] [address]    Culture   Final    NO GROWTH 5 DAYS Performed at [facility], [place] [address], [place], [place] [address]    Report Status 03/19/2019 FINAL  Final  Culture, blood (Routine X 2) w Reflex to ID Panel     Status: None   Collection Time: 03/14/19 11:50 AM  Result Value Ref Range Status   Specimen Description   Final    BLOOD RIGHT HAND Performed at [facility] [address], [place], [place] [address]    Special Requests   Final    BOTTLES DRAWN AEROBIC ONLY Blood Culture adequate volume Performed at [facility] [address] [place], [place] [address]    Culture   Final    NO GROWTH 5 DAYS Performed at [facility], [place] [address], [place], [place] [address]    Report Status 03/19/2019 FINAL  Final  C difficile quick scan w PCR reflex     Status: None   Collection Time: 03/22/19  3:26 PM  Result Value Ref Range Status   C Diff antigen NEGATIVE NEGATIVE Final   C Diff toxin NEGATIVE NEGATIVE Final   C Diff interpretation No C. difficile detected.  Final    Comment: Performed at [facility], [place] [address] [place], [place] [address]      Radiology Studies: Vas [place] Lower Extremity Venous (dvt)  Result Date: 03/22/2019  Lower Venous Study Indications: SOB, and Covid-19.  Limitations: Ventilator, boots. Comparison Study: No prior study on file for comparison Performing Technologist: Sharion Dove RVS  Examination Guidelines: A complete evaluation includes B-mode imaging, spectral Doppler, color Doppler, and  power  Doppler as needed of all accessible portions of each vessel. Bilateral testing is considered an integral part of a complete examination. Limited examinations for reoccurring indications may be performed as noted.  +---------+---------------+---------+-----------+----------+-------------------+ RIGHT    CompressibilityPhasicitySpontaneityPropertiesSummary             +---------+---------------+---------+-----------+----------+-------------------+ CFV      Full           Yes      Yes                                      +---------+---------------+---------+-----------+----------+-------------------+ SFJ      Full                                                             +---------+---------------+---------+-----------+----------+-------------------+ FV Prox  Full                                                             +---------+---------------+---------+-----------+----------+-------------------+ FV Mid   Full                                                             +---------+---------------+---------+-----------+----------+-------------------+ FV DistalFull                                                             +---------+---------------+---------+-----------+----------+-------------------+ PFV      Full                                                             +---------+---------------+---------+-----------+----------+-------------------+ POP      Full           Yes      Yes                                      +---------+---------------+---------+-----------+----------+-------------------+ PTV                                                   Not all segments  visualized          +---------+---------------+---------+-----------+----------+-------------------+ PERO                                                  Not visualized       +---------+---------------+---------+-----------+----------+-------------------+   +---------+---------------+---------+-----------+----------+-------------------+ LEFT     CompressibilityPhasicitySpontaneityPropertiesSummary             +---------+---------------+---------+-----------+----------+-------------------+ CFV      Full                                                             +---------+---------------+---------+-----------+----------+-------------------+ SFJ      Full                                                             +---------+---------------+---------+-----------+----------+-------------------+ FV Prox  Full                                                             +---------+---------------+---------+-----------+----------+-------------------+ FV Mid   Full                                                             +---------+---------------+---------+-----------+----------+-------------------+ FV DistalFull                                                             +---------+---------------+---------+-----------+----------+-------------------+ PFV      Full                                                             +---------+---------------+---------+-----------+----------+-------------------+ POP      Full                                                             +---------+---------------+---------+-----------+----------+-------------------+ PTV      Full  Not all segments                                                          visualized          +---------+---------------+---------+-----------+----------+-------------------+ PERO                                                  Not visualized      +---------+---------------+---------+-----------+----------+-------------------+     Summary: Right: There is no evidence of deep vein thrombosis in the lower  extremity. However, portions of this examination were limited- see technologist comments above. Left: There is no evidence of deep vein thrombosis in the lower extremity. However, portions of this examination were limited- see technologist comments above.  *See table(s) above for measurements and observations. Electronically signed by Monica Martinez MD on 03/22/2019 at 6:11:19 PM.    Final        LOS: 28 days   [facility] Pager on [email or web address]  03/23/2019, 3:24 PM

## 2019-03-23 NOTE — Progress Notes (Signed)
NAME:  Jonathan [name], MRN:  [id number], DOB:  [birth year], LOS: 74 ADMISSION DATE:  02/23/2019, CONSULTATION DATE:  02/23/19 REFERRING MD:  Regenia Skeeter, CHIEF COMPLAINT:  Dyspnea   Brief History   48 year old male admitted on February 23, 2019 with ARDS due to COVID-19.  He has largely recovered from severe ARDS which originally required prone positioning for many days, he developed acute kidney injury requiring continuous renal replacement therapy.  Past Medical History  DM2 HTN  Significant Hospital Events   4/21 admitted and immediately proned on inverse ratio PC ventilation with high PEEP to maintain saturation >85%, Started on NO at 40ppm. 4/22- Tocluzimab X 1 4/22- started CVVHD 4/21-4/24 Proned daily 4/28 -4/30 proned daily , tapered NO to off  4/30 -late evening with increased O2 demands . No paralytics required x 24hr . No vent dysyncrony . Calm on Fent /Versed. Off precedex   5/1 - Remains on CRRT , no acute issues overnight --4.8 L I/O Balance since admit . Continues to Prone on/off 16 on and 8 off . -CXR stable diffuse bilateral infiltrates (slightly improved aeration ) . , Vent .80Fio2 .  5/3 convalescent plasma  Biomarkes improving but severe ARDS persists ad D-dimer still very high (13) without improvement. On IV Heparin gtt. Growing MSSA. SEdation changed to dilaudid gtt. Continues prone 16h and 8h syupine. Currently supine at 80% fio2, peep 18. On TF. STARTED NIMBEX  5/4-5/5-able to stay in the supine position, able to discontinue paralysis and maintain FiO2 0.50, PEEP 8-10.  5/5 hematochezia, bloody gastric aspirate  5/8 followed commands  5/11 extubated  5/12 re-intubated, tracheostomy  5/13 perm cath  5/14 worsening hypoxemia, shock suddenly, RV dilated, no new CXR changes, treated for PE empirically  Consults:  PCCM 4/21  Procedures:  4/21 - ETT 7.5 >> 5/12 5/12 Trach >>  4/21 - RIJ CVC [facility] >> 4/22 L IJ HD Cath>5/15 5/13 R IJ  Permcath>    Significant Diagnostic Tests:  POC U/S 4/21: Bilateral interstitial pattern with bilateral dense consolidation at bases. Echo shows low-normal LVSF. RV moderately dilated with septal shift.  5/14 Echo> LVEF > 65%, RV dilated, decreased RV function (moderate) with RVSP 60  Micro Data:  Blood cultures -PND SARS-CoV2 02/23/2019 - - POSITIVE HIV 4/21 -neg Quant gold 4/2 1- neg .....................Marland Kitchen resp 4/29 >>rare MSSA and yeast  5/10 blood > neg 5/18 c. Diff > neg  Anti-covid RX and antimicrobial RX  Azithromycin 4/21 >>4/25 Ceftriaxone 4/22 >>4/25 Actemra 4/22 Hydroxychloroquine 4/21 > 4/22 ........................... Ceftriaxone 5/2 >>5/10 Cefepime 5/10>>> 5/12 Zosyn 5/12 >> 5/17 Vancomycin 5/10>>>5/17  Fluconazole 5/12 > 5/17   Interim history/subjective:  No acute events Remains off of vasopressors Breathing comfortably on pressure support  Objective   Blood pressure 111/81, pulse (!) 103, temperature 98.9 F (37.2 C), temperature source Oral, resp. rate 18, height 5\' 5"  (1.651 m), weight 60.5 kg, SpO2 100 %.    Vent Mode: PSV;CPAP FiO2 (%):  [40 %] 40 % Set Rate:  [20 bmp] 20 bmp PEEP:  [5 cmH20] 5 cmH20 Pressure Support:  [8 cmH20] 8 cmH20 Plateau Pressure:  [20 cmH20-27 cmH20] 26 cmH20   Intake/Output Summary (Last 24 hours) at 03/23/2019 0853 Last data filed at 03/23/2019 0800 Gross per 24 hour  Intake 1992.97 ml  Output 1733 ml  Net 259.97 ml   Filed Weights   03/21/19 0425 03/22/19 0500 03/23/19 0500  Weight: 62.9 kg 63.4 kg 60.5 kg    Examination:  General:  In bed on vent HENT: NCAT trach in place PULM: CTA B, vent supported breathing CV: RRR, no mgr GI: BS+, soft, nontender MSK: normal bulk and tone Neuro: awake on vent, moves all four extremities   Resolved Hospital Problem list     Assessment & Plan:  Possible pulmonary embolism: Hypoxemia last week, RV dilation on echo Continue full dose anticoagulation for now  Consider 3 months of empiric PE treatment If stable tomorrow transition to oral anticoagulant or lovenox  Acute respiratory failure with hypoxemia, recovering ARDS from COVID-19 Trach collar trial today as long as tolerated Resume full support ventilation tonight Trach collar care per routine Ventilator associated pneumonia prevention protocol  Acute kidney injury, oliguric, doubt there will be any recovery of renal function Continue CVVHD per renal team Once we have him liberated from mechanical ventilation I think he can go to [facility] where he could receive intermittent hemodialysis  Need for sedation for mechanical ventilation: Resolved Minimize sedation Decrease seroquel to 25mg  bid for 2 more doses then stop Continue oxycodone for now  Diarrhea due to stool softeners and laxatives Reduce bowel regimen  Best practice:  Diet: tube feeding, hopeful to avoid PEG Pain/Anxiety/Delirium protocol (if indicated): minimize VAP protocol (if indicated): yes DVT prophylaxis: heparin infusion GI prophylaxis: famotidine Glucose control: SSI Mobility: PT working with patient Code Status: full Family Communication: updated via video chat today Disposition: remain in ICU  Labs   CBC: Recent Labs  Lab 03/19/19 0130 03/20/19 0600 03/21/19 0640 03/22/19 0220 03/23/19 0615  WBC 15.9* 19.7* 22.6* 21.9* 22.6*  HGB 7.5* 8.9* 7.9* 8.2* 8.7*  HCT 25.1* 29.8* 27.4* 28.0* 31.0*  MCV 101.6* 101.7* 102.2* 102.9* 102.3*  PLT 213 287 308 338 425*    Basic Metabolic Panel: Recent Labs  Lab 03/19/19 0130  03/20/19 0600  03/21/19 0640 03/21/19 1552 03/22/19 0220 03/22/19 1555 03/23/19 0615  NA 134*  134*   < > 136   < > 135 135 135 135 135  K 5.3*  5.3*   < > 4.9   < > 5.2* 4.8 4.8 5.0 4.8  CL 98  98   < > 96*   < > 98 99 98 98 97*  CO2 26  26   < > 27   < > 26 27 25 26 26   GLUCOSE 175*  176*   < > 120*   < > 146* 143* 193* 103* 166*  BUN 33*  32*   < > 36*   < >  34* 35* 36* 34* 37*  CREATININE 1.82*  1.81*   < > 1.99*   < > 1.89* 1.78* 1.79* 1.93* 2.04*  CALCIUM 9.1  8.9   < > 10.1   < > 9.5 9.4 9.6 10.0 10.1  MG 2.8*  --  3.4*  --  3.0*  --  3.1*  --  3.2*  PHOS 2.3*   < > 3.6   < > 2.9 3.0 3.6 3.0 2.8   < > = values in this interval not displayed.   GFR: Estimated Creatinine Clearance: 37.9 mL/min (A) (by C-G formula based on SCr of 2.04 mg/dL (H)). Recent Labs  Lab 03/20/19 0600 03/21/19 0640 03/22/19 0220 03/23/19 0615  WBC 19.7* 22.6* 21.9* 22.6*    Liver Function Tests: Recent Labs  Lab 03/21/19 0640 03/21/19 1552 03/22/19 0220 03/22/19 1555 03/23/19 0615  ALBUMIN 2.8* 3.0* 3.1* 3.2* 3.2*   No results for input(s): LIPASE, AMYLASE in the last 168  hours. No results for input(s): AMMONIA in the last 168 hours.  ABG    Component Value Date/Time   PHART 7.381 03/18/2019 1112   PCO2ART 42.1 03/18/2019 1112   PO2ART 50.0 (L) 03/18/2019 1112   HCO3 25.1 03/18/2019 1112   TCO2 26 03/18/2019 1112   ACIDBASEDEF 1.0 03/16/2019 0450   O2SAT 85.0 03/18/2019 1112     Coagulation Profile: No results for input(s): INR, PROTIME in the last 168 hours.  Cardiac Enzymes: No results for input(s): CKTOTAL, CKMB, CKMBINDEX, TROPONINI in the last 168 hours.  HbA1C: Hgb A1c MFr Bld  Date/Time Value Ref Range Status  03/23/2019 06:15 AM 5.5 4.8 - 5.6 % Final    Comment:    (NOTE) Pre diabetes:          5.7%-6.4% Diabetes:              >6.4% Glycemic control for   <7.0% adults with diabetes     CBG: Recent Labs  Lab 03/22/19 1545 03/22/19 2037 03/23/19 0003 03/23/19 0322 03/23/19 0808  GLUCAP 114* 166* 172* 206* 166*     Critical care time: 32 minutes     Roselie Awkward, MD [facility] Pager: [phone] Cell: [phone] If no response, call [phone]

## 2019-03-23 NOTE — Progress Notes (Signed)
[place] for Heparin Indication: Suspected PE  Not on File  Patient Measurements: Height: 5\' 5"  (165.1 cm) Weight: 133 lb 6.1 oz (60.5 kg) IBW/kg (Calculated) : 61.5 Heparin Dosing Weight: 66 kg  Vital Signs: Temp: 98.9 F (37.2 C) (05/19 0800) Temp Source: Oral (05/19 0800) BP: 111/81 (05/19 0833) Pulse Rate: 103 (05/19 0833)  Labs: Recent Labs    03/21/19 0640  03/22/19 0220 03/22/19 1210 03/22/19 1555 03/23/19 0615  HGB 7.9*  --  8.2*  --   --  8.7*  HCT 27.4*  --  28.0*  --   --  31.0*  PLT 308  --  338  --   --  425*  APTT 75*  --  106*  --   --  100*  HEPARINUNFRC 0.42  --  0.63 0.64  --  0.67  CREATININE 1.89*   < > 1.79*  --  1.93* 2.04*   < > = values in this interval not displayed.    Estimated Creatinine Clearance: 37.9 mL/min (A) (by C-G formula based on SCr of 2.04 mg/dL (H)).  Assessment: Jonathan [name] with complications from VELFY-10 including acute respiratory failure s/p tracheostomy and ATN on CRRT now s/p permcath. CCM transitioned to heparin drip 2/2 suspected PE.   Pt currently receiving IV heparin 600 units/hr via peripheral line along with heparin 1000 units/hr via CRRT circuit. Heparin level remains therapeutic. Hgb low but stable, plt elevated. No bleeding noted.  Goal of Therapy:  Heparin level 0.3-0.7 units/ml Monitor platelets by anticoagulation protocol: Yes   Plan:  - Continue IV heparin at 600 units/hr  - Daily CBC/HL - Monitor for bleeding  - F/u dopplers  Sherlon Handing, PharmD, BCPS Clinical pharmacist 03/23/2019 10:05 AM

## 2019-03-23 NOTE — Consult Note (Signed)
[facility] Nurse wound follow up Attempted remote follow up eval of bilateral cheek wounds, however, the primary RN is at lunch.  My plan is to try back after 1330. Val Riles, RN, MSN, CWOCN, CNS-BC, pager [phone]

## 2019-03-24 LAB — RENAL FUNCTION PANEL
Albumin: 3.1 g/dL — ABNORMAL LOW (ref 3.5–5.0)
Albumin: 3.1 g/dL — ABNORMAL LOW (ref 3.5–5.0)
Anion gap: 9 (ref 5–15)
Anion gap: 10 (ref 5–15)
BUN: 41 mg/dL — ABNORMAL HIGH (ref 6–20)
BUN: 35 mg/dL — ABNORMAL HIGH (ref 6–20)
CO2: 28 mmol/L (ref 22–32)
CO2: 27 mmol/L (ref 22–32)
Calcium: 10.1 mg/dL (ref 8.9–10.3)
Calcium: 10.3 mg/dL (ref 8.9–10.3)
Chloride: 99 mmol/L (ref 98–111)
Chloride: 100 mmol/L (ref 98–111)
Creatinine, Ser: 2.05 mg/dL — ABNORMAL HIGH (ref 0.61–1.24)
Creatinine, Ser: 1.92 mg/dL — ABNORMAL HIGH (ref 0.61–1.24)
GFR calc Af Amer: 43 mL/min — ABNORMAL LOW (ref >60)
GFR calc Af Amer: 47 mL/min — ABNORMAL LOW (ref >60)
GFR calc non Af Amer: 37 mL/min — ABNORMAL LOW (ref >60)
GFR calc non Af Amer: 40 mL/min — ABNORMAL LOW (ref >60)
Glucose, Bld: 124 mg/dL — ABNORMAL HIGH (ref 70–99)
Glucose, Bld: 155 mg/dL — ABNORMAL HIGH (ref 70–99)
Phosphorus: 2.9 mg/dL (ref 2.5–4.6)
Phosphorus: 3 mg/dL (ref 2.5–4.6)
Potassium: 4.5 mmol/L (ref 3.5–5.1)
Potassium: 4.8 mmol/L (ref 3.5–5.1)
Sodium: 136 mmol/L (ref 135–145)
Sodium: 137 mmol/L (ref 135–145)

## 2019-03-24 LAB — GLUCOSE, CAPILLARY
Glucose-Capillary: 108 mg/dL — ABNORMAL HIGH (ref 70–99)
Glucose-Capillary: 118 mg/dL — ABNORMAL HIGH (ref 70–99)
Glucose-Capillary: 141 mg/dL — ABNORMAL HIGH (ref 70–99)
Glucose-Capillary: 149 mg/dL — ABNORMAL HIGH (ref 70–99)
Glucose-Capillary: 154 mg/dL — ABNORMAL HIGH (ref 70–99)
Glucose-Capillary: 203 mg/dL — ABNORMAL HIGH (ref 70–99)
Glucose-Capillary: 241 mg/dL — ABNORMAL HIGH (ref 70–99)

## 2019-03-24 LAB — APTT: aPTT: 84 seconds — ABNORMAL HIGH (ref 24–36)

## 2019-03-24 LAB — HEPATIC FUNCTION PANEL
ALT: 50 U/L — ABNORMAL HIGH (ref 0–44)
AST: 52 U/L — ABNORMAL HIGH (ref 15–41)
Albumin: 3.2 g/dL — ABNORMAL LOW (ref 3.5–5.0)
Alkaline Phosphatase: 177 U/L — ABNORMAL HIGH (ref 38–126)
Bilirubin, Direct: <0.1 mg/dL (ref 0.0–0.2)
Total Bilirubin: 0.4 mg/dL (ref 0.3–1.2)
Total Protein: 9.4 g/dL — ABNORMAL HIGH (ref 6.5–8.1)

## 2019-03-24 LAB — HEPARIN LEVEL (UNFRACTIONATED): Heparin Unfractionated: 0.55 IU/mL (ref 0.30–0.70)

## 2019-03-24 LAB — MAGNESIUM: Magnesium: 3 mg/dL — ABNORMAL HIGH (ref 1.7–2.4)

## 2019-03-24 LAB — CBC
HCT: 28.2 % — ABNORMAL LOW (ref 39.0–52.0)
Hemoglobin: 8 g/dL — ABNORMAL LOW (ref 13.0–17.0)
MCH: 29.3 pg (ref 26.0–34.0)
MCHC: 28.4 g/dL — ABNORMAL LOW (ref 30.0–36.0)
MCV: 103.3 fL — ABNORMAL HIGH (ref 80.0–100.0)
Platelets: 408 10*3/uL — ABNORMAL HIGH (ref 150–400)
RBC: 2.73 MIL/uL — ABNORMAL LOW (ref 4.22–5.81)
RDW: 18.2 % — ABNORMAL HIGH (ref 11.5–15.5)
WBC: 21.2 10*3/uL — ABNORMAL HIGH (ref 4.0–10.5)
nRBC: 1.2 % — ABNORMAL HIGH (ref 0.0–0.2)

## 2019-03-24 MED ORDER — OXYCODONE HCL 5 MG PO TABS
5.0000 mg | ORAL_TABLET | Freq: Four times a day (QID) | ORAL | Status: DC | PRN
  Administered 2019-03-26 – 2019-03-30 (×6): 5 mg via ORAL
  Filled 2019-03-24 (×6): qty 1

## 2019-03-24 NOTE — Progress Notes (Signed)
Patient was placed in the chair with the assistance of PT and OT. Patient remained in the chair for about 3.5 hours and did very well. Was assisted back to bed with a total of 3 RN's. VS were all WNL's and patient had no complaints with transition back to bed from chair. Will continue to monitor.

## 2019-03-24 NOTE — Progress Notes (Signed)
NAME:  Jonathan [name], MRN:  [id number], DOB:  Jul 01, 1971, LOS: 78 ADMISSION DATE:  02/23/2019, CONSULTATION DATE:  02/23/19 REFERRING MD:  Regenia Skeeter, CHIEF COMPLAINT:  Dyspnea   Brief History   48 year old male admitted on February 23, 2019 with ARDS due to COVID-19.  He has largely recovered from severe ARDS which originally required prone positioning for many days, he developed acute kidney injury requiring continuous renal replacement therapy.  Past Medical History  DM2 HTN  Significant Hospital Events   4/21 admitted and immediately proned on inverse ratio PC ventilation with high PEEP to maintain saturation >85%, Started on NO at 40ppm. 4/22- Tocluzimab X 1 4/22- started CVVHD 4/21-4/24 Proned daily 4/28 -4/30 proned daily , tapered NO to off  4/30 -late evening with increased O2 demands . No paralytics required x 24hr . No vent dysyncrony . Calm on Fent /Versed. Off precedex   5/1 - Remains on CRRT , no acute issues overnight --4.8 L I/O Balance since admit . Continues to Prone on/off 16 on and 8 off . -CXR stable diffuse bilateral infiltrates (slightly improved aeration ) . , Vent .80Fio2 .  5/3 convalescent plasma  Biomarkes improving but severe ARDS persists ad D-dimer still very high (13) without improvement. On IV Heparin gtt. Growing MSSA. SEdation changed to dilaudid gtt. Continues prone 16h and 8h syupine. Currently supine at 80% fio2, peep 18. On TF. STARTED NIMBEX  5/4-5/5-able to stay in the supine position, able to discontinue paralysis and maintain FiO2 0.50, PEEP 8-10.  5/5 hematochezia, bloody gastric aspirate  5/8 followed commands  5/11 extubated  5/12 re-intubated, tracheostomy  5/13 perm cath  5/14 worsening hypoxemia, shock suddenly, RV dilated, no new CXR changes, treated for PE empirically  Consults:  PCCM 4/21  Procedures:  4/21 - ETT 7.5 >> 5/12 5/12 Trach >>  4/21 - RIJ CVC [facility] >> 4/22 L IJ HD Cath>5/15 5/13 R IJ  Permcath>    Significant Diagnostic Tests:  POC U/S 4/21: Bilateral interstitial pattern with bilateral dense consolidation at bases. Echo shows low-normal LVSF. RV moderately dilated with septal shift.  5/14 Echo> LVEF > 65%, RV dilated, decreased RV function (moderate) with RVSP 60  Micro Data:  Blood cultures -PND SARS-CoV2 02/23/2019 - - POSITIVE HIV 4/21 -neg Quant gold 4/2 1- neg .....................Marland Kitchen resp 4/29 >>rare MSSA and yeast  5/10 blood > neg 5/18 c. Diff > neg  Anti-covid RX and antimicrobial RX  Azithromycin 4/21 >>4/25 Ceftriaxone 4/22 >>4/25 Actemra 4/22 Hydroxychloroquine 4/21 > 4/22 ........................... Ceftriaxone 5/2 >>5/10 Cefepime 5/10>>> 5/12 Zosyn 5/12 >> 5/17 Vancomycin 5/10>>>5/17  Fluconazole 5/12 > 5/17   Interim history/subjective:   Tolerated trach collar trial for 10 to 12 hours yesterday, became slightly hypoxemic and required mechanical ventilation again last night Attempted again this morning but had some thick secretions, had to go back on mechanical ventilation with Occupational Therapy  Objective   Blood pressure 99/64, pulse 90, temperature 98.3 F (36.8 C), temperature source Oral, resp. rate (!) 24, height 5\' 5"  (1.651 m), weight 58.8 kg, SpO2 99 %.    Vent Mode: PCV FiO2 (%):  [40 %] 40 % Set Rate:  [20 bmp] 20 bmp PEEP:  [5 cmH20] 5 cmH20 Plateau Pressure:  [19 cmH20-26 cmH20] 19 cmH20   Intake/Output Summary (Last 24 hours) at 03/24/2019 1327 Last data filed at 03/24/2019 1200 Gross per 24 hour  Intake 1624.01 ml  Output 166 ml  Net 1458.01 ml   [employer]  03/22/19 0500 03/23/19 0500 03/24/19 0500  Weight: 63.4 kg 60.5 kg 58.8 kg    Examination:  General:  In bed off vent HENT: NCAT tracheostomy in place PULM: CTA B, spontaneous breathing CV: RRR, no mgr GI: BS+, soft, nontender MSK: normal bulk and tone Neuro: awake on vent, can lift hands/arms off bed, moves legs    Resolved Hospital  Problem list     Assessment & Plan:  Likely pulmonary embolism: Hypoxemia last week, RV dilation on echo Would prefer to forgo CT angiogram because of renal function (possible recovery?) Continue full dose anticoagulation for now Would plan on 3 months of empiric PE treatment From my standpoint okay to transition to Eliquis  Acute respiratory failure with hypoxemia, recovering ARDS from COVID-19 Resume trach collar trial again this afternoon If able to tolerate tracheostomy collar trial with cuff down then start working with speech therapy for speaking valve Plan full mechanical ventilatory support at night for now Ventilator associated pneumonia prevention protocol Repeat CXR in AM  Acute kidney injury, oliguric, doubt there will be any recovery of renal function For now continuing CVVHD until he is liberated from the ventilator Continue CVVHD per renal team Once we have him liberated from mechanical ventilation I think he can go to [facility] where he could receive intermittent hemodialysis  Need for sedation for mechanical ventilation: Resolved Stop Seroquel Continue oxycodone for pain, add as needed dosing  Diarrhea due to stool softeners and laxatives Prn only bowel regimen  Best practice:  Diet: tube feeding, hopeful to avoid PEG Pain/Anxiety/Delirium protocol (if indicated): minimize VAP protocol (if indicated): yes DVT prophylaxis: heparin infusion GI prophylaxis: famotidine Glucose control: SSI Mobility: PT working with patient Code Status: full Family Communication: updated via video chat today Disposition: remain in ICU  Labs   CBC: Recent Labs  Lab 03/20/19 0600 03/21/19 0640 03/22/19 0220 03/23/19 0615 03/24/19 0355  WBC 19.7* 22.6* 21.9* 22.6* 21.2*  HGB 8.9* 7.9* 8.2* 8.7* 8.0*  HCT 29.8* 27.4* 28.0* 31.0* 28.2*  MCV 101.7* 102.2* 102.9* 102.3* 103.3*  PLT 287 308 338 425* 408*    Basic Metabolic Panel: Recent Labs  Lab 03/20/19 0600   03/21/19 0640  03/22/19 0220 03/22/19 1555 03/23/19 0615 03/23/19 1625 03/24/19 0355  NA 136   < > 135   < > 135 135 135 136 136  K 4.9   < > 5.2*   < > 4.8 5.0 4.8 4.6 4.5  CL 96*   < > 98   < > 98 98 97* 99 99  CO2 27   < > 26   < > 25 26 26 27 28   GLUCOSE 120*   < > 146*   < > 193* 103* 166* 148* 124*  BUN 36*   < > 34*   < > 36* 34* 37* 37* 41*  CREATININE 1.99*   < > 1.89*   < > 1.79* 1.93* 2.04* 1.96* 2.05*  CALCIUM 10.1   < > 9.5   < > 9.6 10.0 10.1 10.2 10.1  MG 3.4*  --  3.0*  --  3.1*  --  3.2*  --  3.0*  PHOS 3.6   < > 2.9   < > 3.6 3.0 2.8 2.5 2.9   < > = values in this interval not displayed.   GFR: Estimated Creatinine Clearance: 36.7 mL/min (A) (by C-G formula based on SCr of 2.05 mg/dL (H)). Recent Labs  Lab 03/21/19 0640 03/22/19 0220 03/23/19 8466  03/24/19 0355  WBC 22.6* 21.9* 22.6* 21.2*    Liver Function Tests: Recent Labs  Lab 03/22/19 0220 03/22/19 1555 03/23/19 0615 03/23/19 1625 03/24/19 0355  AST  --   --   --   --  52*  ALT  --   --   --   --  50*  ALKPHOS  --   --   --   --  177*  BILITOT  --   --   --   --  0.4  PROT  --   --   --   --  9.4*  ALBUMIN 3.1* 3.2* 3.2* 3.3* 3.2*  3.1*   No results for input(s): LIPASE, AMYLASE in the last 168 hours. No results for input(s): AMMONIA in the last 168 hours.  ABG    Component Value Date/Time   PHART 7.381 03/18/2019 1112   PCO2ART 42.1 03/18/2019 1112   PO2ART 50.0 (L) 03/18/2019 1112   HCO3 25.1 03/18/2019 1112   TCO2 26 03/18/2019 1112   ACIDBASEDEF 1.0 03/16/2019 0450   O2SAT 85.0 03/18/2019 1112     Coagulation Profile: No results for input(s): INR, PROTIME in the last 168 hours.  Cardiac Enzymes: No results for input(s): CKTOTAL, CKMB, CKMBINDEX, TROPONINI in the last 168 hours.  HbA1C: Hgb A1c MFr Bld  Date/Time Value Ref Range Status  03/23/2019 06:15 AM 5.5 4.8 - 5.6 % Final    Comment:    (NOTE) Pre diabetes:          5.7%-6.4% Diabetes:              >6.4%  Glycemic control for   <7.0% adults with diabetes     CBG: Recent Labs  Lab 03/23/19 1641 03/23/19 2054 03/24/19 0005 03/24/19 0358 03/24/19 0803  GLUCAP 142* 163* 241* 118* 154*     Critical care time: 32 minutes     Roselie Awkward, MD [place] [facility] Pager: [phone] Cell: [phone] If no response, call [phone]

## 2019-03-24 NOTE — Progress Notes (Signed)
Occupational Therapy Treatment Patient Details Name: Jonathan [name] MRN: [id number] DOB: Jan 08, 1971 Today's Date: 03/24/2019    History of present illness 48 y.o. male admitted on 02/23/19 with AMS, O2 sats in the ED were 50%.  Pt dx with COVID 19 and resultant ARDS.  He was intubated 4/21 adn remains intubated.  His course is complicated by acute kidney injury requiring CVVHD.     OT comments  Pt on TC initially then switched to vent during session by RT. Completed bed level exercise with excellent participation. Improved strength from yesterday. L shoulder appears weaker than R, especially in ER. Will continue to assess. Plan to return this pm to hopefully progress EOB/OOB. Asked nursing to increase HOB this am to increase tolerance for sitting upright.   Follow Up Recommendations  CIR    Equipment Recommendations  3 in 1 bedside commode    Recommendations for Other Services Rehab consult    Precautions / Restrictions Precautions Precautions: Other (comment) Precaution Comments: R IJ HD catheter, on vent/trach Restrictions Weight Bearing Restrictions: No       Mobility Bed Mobility                  Transfers                      Balance                                           ADL either performed or assessed with clinical judgement   ADL                                               Vision       Perception     Praxis      Cognition Arousal/Alertness: Awake/alert Behavior During Therapy: WFL for tasks assessed/performed Overall Cognitive Status: Difficult to assess                                          Exercises Exercises: General Upper Extremity;General Lower Extremity General Exercises - Upper Extremity Shoulder Flexion: Both;20 reps;Supine;Seated;AROM;AAROM Shoulder ABduction: AROM;AAROM;Strengthening;Both;10 reps Elbow Flexion: AROM;AAROM;Both;15 reps;Seated Elbow  Extension: AROM;AAROM;Both;15 reps;Seated Wrist Flexion: AROM;Strengthening;Both;10 reps Wrist Extension: PROM;Both;10 reps Digit Composite Flexion: 15 reps;Squeeze ball;Both Composite Extension: Strengthening;Both;15 reps General Exercises - Lower Extremity Ankle Circles/Pumps: AROM;Both;10 reps Quad Sets: Supine;15 reps;Strengthening Short Arc Quad: AROM;AAROM;Both;10 reps;Seated Hip ABduction/ADduction: AROM;Strengthening;Both Low Level/ICU Exercises Stabilized Bridging: Strengthening;Both;5 reps   Shoulder Instructions       General Comments      Pertinent Vitals/ Pain       Pain Assessment: Faces Faces Pain Scale: Hurts a little bit Pain Location: with B shoulder ROM Pain Descriptors / Indicators: Grimacing Pain Intervention(s): Limited activity within patient's tolerance  Home Living                                          Prior Functioning/Environment              Frequency  Min 3X/week  Progress Toward Goals  OT Goals(current goals can now be found in the care plan section)  Progress towards OT goals: Progressing toward goals  Acute Rehab OT Goals Patient Stated Goal: nodds head to "to get stronger" OT Goal Formulation: With patient Time For Goal Achievement: 04/05/19 Potential to Achieve Goals: Good ADL Goals Pt Will Perform Grooming: with min assist;sitting Pt Will Perform Upper Body Bathing: with min assist;sitting Pt Will Perform Lower Body Bathing: with mod assist;bed level Additional ADL Goal #1: Pt will maintain midline postural control EOB x 10 min wiht mod A with VSS in preparation for ADL Additional ADL Goal #2: Pt will tolerate OOB in chair x 1 hour and engage in therapeutic activity.  Plan Discharge plan remains appropriate    Co-evaluation                 AM-PAC OT "6 Clicks" Daily Activity     Outcome Measure   Help from another person eating meals?: Total Help from another person taking care of  personal grooming?: A Lot Help from another person toileting, which includes using toliet, bedpan, or urinal?: Total Help from another person bathing (including washing, rinsing, drying)?: A Lot Help from another person to put on and taking off regular upper body clothing?: Total Help from another person to put on and taking off regular lower body clothing?: Total 6 Click Score: 8    End of Session Equipment Utilized During Treatment: Oxygen(vent)  OT Visit Diagnosis: Other abnormalities of gait and mobility (R26.89);Muscle weakness (generalized) (M62.81);Pain Pain - part of body: (B shoulders)   Activity Tolerance Patient tolerated treatment well   Patient Left in bed;with call bell/phone within reach;with nursing/sitter in room   Nurse Communication Mobility status;Other (comment)        Time: 0929-5747 OT Time Calculation (min): 35 min  Charges: OT General Charges $OT Visit: 1 Visit OT Treatments $Therapeutic Exercise: 23-37 mins  Maurie Boettcher, OT/L   Acute OT Clinical Specialist [place] Pager [phone] Office [phone]    [facility] 03/24/2019, 11:34 AM

## 2019-03-24 NOTE — Progress Notes (Signed)
PROGRESS NOTE  Jonathan [name] PJA:[id number] DOB: 1970-11-21 DOA: 02/23/2019  PCP: Patient, No Pcp Per  Brief History/Interval Summary: 48 y.o. male [place] speaking, arrived from [place] on 02/13/2019 and was living in close quarters with 6 other workers. He developed increasing rrespiratory distress and was intubated for ARDS due to COVID-19 infection. He was started on CRRT due to ATN, paralyzed and prone. He was transferred to [place] for further management. He was eventually extubated, but quickly became short of breath again and aspirated when he started vomiting. He was re intubated and ended up with tracheostomy.   Events: 02/23/2019 admitted to the ICU. 02/24/2019 started on CRRT 4/21-4/24 Proned daily 4/28 -4/30 proned daily  03/04/2019 increase in oxygen demand require paralytic for 24 hours. 03/09/2019 hematochezia from gastric aspirate  Lines and tubes: 4/21 - ETT >>5.11.2020 4/21 - RIJ CVC 4/22 L IJ HD Cath> 5/13 Permacath right  Antibiotics: 02/23/2019-425 2020 azithromycin and Rocephin 02/23/2019-02/24/2019 03/06/2019 Rocephin 5/11-5/17 Vancomycin 5/12-5/18 Zosyn 5/12-5/17 Fluconazole for candida in tracheal aspirate and recent Actemra  Consultants: Pulmonology   Subjective/Interval History: Patient awake and alert.  Following commands.  Noted to be on trach collar this morning.   Assessment/Plan:  Acute Hypoxic Resp. Failure due to Acute Covid 19 Viral Illness during the ongoing 2020 Covid 19 Pandemic Medications/ARDS/Presumed Acute PE  Fever: Has been afebrile the last several days Oxygen requirements: Remains on ventilator support at 40% FiO2.  Saturating in 90s.  He did tolerate being on trach collar for many hours yesterday.   Antibiotics: Not on any antibiotics currently Steroids: Not on steroids at this time Diuretics: Not on any diuretics Actemra: Received on 4/22 Convalescent Plasma: On 5/3 Vitamin C and Zinc: None  Patient stable  from a respiratory standpoint.  His lungs do reveal crackles today.  Suctioning to be performed.  Pulmonology is following.  Trach collar as tolerated.  Repeat chest x-ray tomorrow. Patient was extubated on 5/11 and then subsequently had to be reintubated on 5/12.  Tracheostomy placed.  Completed course of antibiotics.  Not on steroids anymore.    Patient then had an acute episode of hypoxia with respiratory decompensation.  Echocardiogram showed right-sided strain.  Patient was empiricaly placed on IV heparin due to concern for underlying pulmonary embolism.  Lower extremity Doppler studies negative.  Continue IV heparin for now.  Will need to consider transition to an enteral agent such as Eliquis soon.  Treat for 3 months.  Septic shock Patient has been weaned off of pressors.  Acute kidney injury due to ATN Remains on CRRT.  Still not making any urine.  Discussed with Dr. Jonnie Finner with nephrology who continues to follow and manage.  Once patient is off of ventilator and out of the ICU he will need intermittent dialysis for which he will need to be transferred to [facility].   Anemia due to chronic illness and possible acute GI blood loss No further bleeding has been noted.  Hemoglobin is stable.  Leukocytosis Patient is afebrile.  Reason for elevated WBC is not entirely clear.  Due to reports of loose stools stool studies were sent and was negative for C. difficile.  WBC remains elevated though patient is afebrile.  Continue to monitor for now.  Diabetes mellitus type 2 Patient currently on basal insulin and sliding scale coverage.  Monitor CBGs.  HbA1c 5.5.  CBGs are reasonably well controlled.  May need to cut back on basal insulin.  FEN Continue tube feedings.  Pressure  injury on the skin of nose Local wound care.   DVT Prophylaxis: On heparin infusion  Lab Results  Component Value Date   PLT 408 (H) 03/24/2019     PUD Prophylaxis: On famotidine Code Status: Partial  code Family Communication:  Wife Energy manager number [phone].  Will call family member today. Disposition Plan: Patient will remain in ICU for now   Medications:  Scheduled: . chlorhexidine  15 mL Mouth/Throat BID  . Chlorhexidine Gluconate Cloth  6 each Topical Daily  . famotidine  20 mg Per Tube Daily  . feeding supplement (PRO-STAT SUGAR FREE 64)  60 mL Per Tube BID  . free water  100 mL Per Tube Q8H  . insulin aspart  0-15 Units Subcutaneous Q4H  . insulin aspart  6 Units Subcutaneous Q4H  . insulin glargine  15 Units Subcutaneous BID  . mouth rinse  15 mL Mouth Rinse 10 times per day  . oxyCODONE  5 mg Oral Q8H  . sodium chloride flush  10-40 mL Intracatheter Q12H   Continuous: .  prismasol BGK 4/2.5 300 mL/hr at 03/24/19 0726  .  prismasol BGK 4/2.5 500 mL/hr at 03/24/19 1052  . sodium chloride    . sodium chloride Stopped (03/21/19 2128)  . feeding supplement (VITAL 1.5 CAL) 45 mL/hr at 03/22/19 1600  . heparin 10,000 units/ 20 mL infusion syringe 1,000 Units/hr (03/24/19 1200)  . heparin 600 Units/hr (03/24/19 1200)  . prismasol BGK 4/2.5 1,500 mL/hr at 03/24/19 1052   QVZ:DGLOV/FIEPPIRJ arterial line **AND** sodium chloride, sodium chloride, acetaminophen (TYLENOL) oral liquid 160 mg/5 mL, glycopyrrolate, haloperidol lactate, heparin, heparin, heparin, HYDROmorphone (DILAUDID) injection, metoprolol tartrate, midazolam, ondansetron (ZOFRAN) IV, oxyCODONE, sodium chloride flush   Objective:  Vital Signs  Vitals:   03/24/19 1128 03/24/19 1130 03/24/19 1158 03/24/19 1200  BP: 102/63 112/74  99/64  Pulse: 89 90 91 90  Resp: (!) 26 20 (!) 24 (!) 24  Temp:   98.3 F (36.8 C)   TempSrc:   Oral   SpO2: 100% 100% 99% 99%  Weight:      Height:        Intake/Output Summary (Last 24 hours) at 03/24/2019 1300 Last data filed at 03/24/2019 1200 Gross per 24 hour  Intake 1624.01 ml  Output 166 ml  Net 1458.01 ml   Filed Weights   03/22/19 0500  03/23/19 0500 03/24/19 0500  Weight: 63.4 kg 60.5 kg 58.8 kg   General appearance: Awake alert.  In no distress Tracheostomy noted Resp: Coarse breath sounds.  Few rhonchorous breath sounds heard bilaterally.  Crackles at the bases.   Cardio: S1-S2 is normal regular.  No S3-S4.  No rubs murmurs or bruit GI: Abdomen is soft.  Nontender nondistended.  Bowel sounds are present normal.  No masses organomegaly Extremities: No edema.  Full range of motion of lower extremities. Neurologic: No focal neurological deficits.    Lab Results:  Data Reviewed: I have personally reviewed following labs and imaging studies  CBC: Recent Labs  Lab 03/20/19 0600 03/21/19 0640 03/22/19 0220 03/23/19 0615 03/24/19 0355  WBC 19.7* 22.6* 21.9* 22.6* 21.2*  HGB 8.9* 7.9* 8.2* 8.7* 8.0*  HCT 29.8* 27.4* 28.0* 31.0* 28.2*  MCV 101.7* 102.2* 102.9* 102.3* 103.3*  PLT 287 308 338 425* 408*    Basic Metabolic Panel: Recent Labs  Lab 03/20/19 0600  03/21/19 0640  03/22/19 0220 03/22/19 1555 03/23/19 0615 03/23/19 1625 03/24/19 0355  NA 136   < > 135   < >  135 135 135 136 136  K 4.9   < > 5.2*   < > 4.8 5.0 4.8 4.6 4.5  CL 96*   < > 98   < > 98 98 97* 99 99  CO2 27   < > 26   < > 25 26 26 27 28   GLUCOSE 120*   < > 146*   < > 193* 103* 166* 148* 124*  BUN 36*   < > 34*   < > 36* 34* 37* 37* 41*  CREATININE 1.99*   < > 1.89*   < > 1.79* 1.93* 2.04* 1.96* 2.05*  CALCIUM 10.1   < > 9.5   < > 9.6 10.0 10.1 10.2 10.1  MG 3.4*  --  3.0*  --  3.1*  --  3.2*  --  3.0*  PHOS 3.6   < > 2.9   < > 3.6 3.0 2.8 2.5 2.9   < > = values in this interval not displayed.    GFR: Estimated Creatinine Clearance: 36.7 mL/min (A) (by C-G formula based on SCr of 2.05 mg/dL (H)).  Liver Function Tests: Recent Labs  Lab 03/22/19 0220 03/22/19 1555 03/23/19 0615 03/23/19 1625 03/24/19 0355  AST  --   --   --   --  52*  ALT  --   --   --   --  50*  ALKPHOS  --   --   --   --  177*  BILITOT  --   --   --   --   0.4  PROT  --   --   --   --  9.4*  ALBUMIN 3.1* 3.2* 3.2* 3.3* 3.2*  3.1*    CBG: Recent Labs  Lab 03/23/19 1641 03/23/19 2054 03/24/19 0005 03/24/19 0358 03/24/19 0803  GLUCAP 142* 163* 241* 118* 154*     Recent Results (from the past 240 hour(s))  C difficile quick scan w PCR reflex     Status: None   Collection Time: 03/22/19  3:26 PM  Result Value Ref Range Status   C Diff antigen NEGATIVE NEGATIVE Final   C Diff toxin NEGATIVE NEGATIVE Final   C Diff interpretation No C. difficile detected.  Final    Comment: Performed at [facility], [place] [address], [place], [place] [address]      Radiology Studies: Vas [place] Lower Extremity Venous (dvt)  Result Date: 03/22/2019  Lower Venous Study Indications: SOB, and Covid-19.  Limitations: Ventilator, boots. Comparison Study: No prior study on file for comparison Performing Technologist: Sharion Dove RVS  Examination Guidelines: A complete evaluation includes B-mode imaging, spectral Doppler, color Doppler, and power Doppler as needed of all accessible portions of each vessel. Bilateral testing is considered an integral part of a complete examination. Limited examinations for reoccurring indications may be performed as noted.  +---------+---------------+---------+-----------+----------+-------------------+ RIGHT    CompressibilityPhasicitySpontaneityPropertiesSummary             +---------+---------------+---------+-----------+----------+-------------------+ CFV      Full           Yes      Yes                                      +---------+---------------+---------+-----------+----------+-------------------+ SFJ      Full                                                             +---------+---------------+---------+-----------+----------+-------------------+  FV Prox  Full                                                              +---------+---------------+---------+-----------+----------+-------------------+ FV Mid   Full                                                             +---------+---------------+---------+-----------+----------+-------------------+ FV DistalFull                                                             +---------+---------------+---------+-----------+----------+-------------------+ PFV      Full                                                             +---------+---------------+---------+-----------+----------+-------------------+ POP      Full           Yes      Yes                                      +---------+---------------+---------+-----------+----------+-------------------+ PTV                                                   Not all segments                                                          visualized          +---------+---------------+---------+-----------+----------+-------------------+ PERO                                                  Not visualized      +---------+---------------+---------+-----------+----------+-------------------+   +---------+---------------+---------+-----------+----------+-------------------+ LEFT     CompressibilityPhasicitySpontaneityPropertiesSummary             +---------+---------------+---------+-----------+----------+-------------------+ CFV      Full                                                             +---------+---------------+---------+-----------+----------+-------------------+ SFJ  Full                                                             +---------+---------------+---------+-----------+----------+-------------------+ FV Prox  Full                                                             +---------+---------------+---------+-----------+----------+-------------------+ FV Mid   Full                                                              +---------+---------------+---------+-----------+----------+-------------------+ FV DistalFull                                                             +---------+---------------+---------+-----------+----------+-------------------+ PFV      Full                                                             +---------+---------------+---------+-----------+----------+-------------------+ POP      Full                                                             +---------+---------------+---------+-----------+----------+-------------------+ PTV      Full                                         Not all segments                                                          visualized          +---------+---------------+---------+-----------+----------+-------------------+ PERO                                                  Not visualized      +---------+---------------+---------+-----------+----------+-------------------+     Summary: Right: There is no evidence of deep vein thrombosis in the lower extremity. However, portions of this examination were limited- see technologist comments above. Left: There  is no evidence of deep vein thrombosis in the lower extremity. However, portions of this examination were limited- see technologist comments above.  *See table(s) above for measurements and observations. Electronically signed by Monica Martinez MD on 03/22/2019 at 6:11:19 PM.    Final        LOS: 46 days   [place] [facility] Pager on [email or web address]  03/24/2019, 1:00 PM

## 2019-03-24 NOTE — Progress Notes (Signed)
Placed patient on mechanical ventilation for HS rest.  Tolerated ATC well. No distress noted.

## 2019-03-24 NOTE — Progress Notes (Signed)
Occupational Therapy Treatment Note  Pt seen on 40% TC. At rest, SpO2 100%; RR 24; BP 122/89; sitting EOB SpO2 100%; RR 27; BP 115/75; Chair SpO2 96-100; RR 24-33; BP 118/ 75. Pt able ot progress OOB to recliner today with Max A +2. Nsg staff managed CRRT lines and assisted with line management. Pt complaining of being dizzy, which appeared to improve after sitting. Excellent participation. Will continue to follow acutely.    03/24/19 1700  OT Visit Information  Last OT Received On 03/24/19  Assistance Needed +3 or more (for line managment)  PT/OT/SLP Co-Evaluation/Treatment Yes  Reason for Co-Treatment Complexity of the patient's impairments (multi-system involvement);For patient/therapist safety;To address functional/ADL transfers  OT goals addressed during session ADL's and self-care;Strengthening/ROM  History of Present Illness 48 y.o. male admitted on 02/23/19 with AMS, O2 sats in the ED were 50%.  Pt dx with COVID 19 and resultant ARDS.  He was intubated 4/21 adn remains intubated.  His course is complicated by acute kidney injury requiring CVVHD.    Precautions  Precautions Other (comment)  Pain Assessment  Pain Assessment Faces  Faces Pain Scale 4  Pain Location general discomfort  Pain Descriptors / Indicators Grimacing  Pain Intervention(s) Limited activity within patient's tolerance  Cognition  Arousal/Alertness Awake/alert  Behavior During Therapy WFL for tasks assessed/performed;Anxious  Overall Cognitive Status Difficult to assess  General Comments ICU RN interpreted, communicated with nodding  Difficult to assess due to Tracheostomy  Upper Extremity Assessment  Upper Extremity Assessment Generalized weakness;RUE deficits/detail  RUE Deficits / Details weake than LUE, especially in ER; increased shoulder AROM to 45 FF; assisted to 90 with AAROM; improved elbow/wrist/hand strength to 3+/4/5  LUE Deficits / Details poor scapular humeral rhythm; improved from yesterday. Able  to achieve @ 120 FF AAROM  Lower Extremity Assessment  Lower Extremity Assessment Defer to PT evaluation  ADL  Overall ADL's  Needs assistance/impaired  Grooming Wash/dry face;Wash/dry hands;Moderate assistance;Sitting  Functional mobility during ADLs Maximal assistance;+2 for physical assistance  Bed Mobility  Overal bed mobility Needs Assistance  Bed Mobility Supine to Sit  Supine to sit Max assist;+2 for physical assistance  General bed mobility comments patient moved legs towards edge, assist with trunk, Pt. assisted to scoot to bed edge.with use of bed pad to help conserve energy  Balance  Sitting balance-Leahy Scale Poor  Sitting balance - Comments patient did self support in sitting with close guarding briefly before transfering.  Vision- Assessment  Additional Comments complains of blurred vision  Transfers  Overall transfer level Needs assistance  Transfers Sit to/from Stand;Stand Pivot Transfers  Sit to Stand Max assist  Stand pivot transfers Max assist;+2 physical assistance  General transfer comment Able to initiate sit - stand but unableto complete full upright standing due to trunk and hip weakness; able ot take small pivital steps  General Comments  General comments (skin integrity, edema, etc.) is always cold  OT - End of Session  Equipment Utilized During Treatment Oxygen (40% TC)  Activity Tolerance Patient tolerated treatment well  Patient left in chair;with call bell/phone within reach;with nursing/sitter in room  Nurse Communication Mobility status  OT Assessment/Plan  OT Plan Discharge plan remains appropriate  OT Visit Diagnosis Other abnormalities of gait and mobility (R26.89);Muscle weakness (generalized) (M62.81);Pain  Pain - Right/Left Right  Pain - part of body Shoulder  OT Frequency (ACUTE ONLY) Min 3X/week  Recommendations for Other Services Rehab consult  Follow Up Recommendations CIR  OT Equipment 3 in 1 bedside  commode  AM-PAC OT "6 Clicks"  Daily Activity Outcome Measure (Version 2)  Help from another person eating meals? 1  Help from another person taking care of personal grooming? 2  Help from another person toileting, which includes using toliet, bedpan, or urinal? 1  Help from another person bathing (including washing, rinsing, drying)? 2  Help from another person to put on and taking off regular upper body clothing? 1  Help from another person to put on and taking off regular lower body clothing? 1  6 Click Score 8  OT Goal Progression  Progress towards OT goals Progressing toward goals  Acute Rehab OT Goals  Patient Stated Goal nodds head to "to get stronger"  OT Goal Formulation With patient  Time For Goal Achievement 04/05/19  Potential to Achieve Goals Good  ADL Goals  Pt Will Perform Grooming with min assist;sitting  Pt Will Perform Upper Body Bathing with min assist;sitting  Pt Will Perform Lower Body Bathing with mod assist;bed level  Additional ADL Goal #1 Pt will maintain midline postural control EOB x 10 min wiht mod A with VSS in preparation for ADL  Additional ADL Goal #2 Pt will tolerate OOB in chair x 1 hour and engage in therapeutic activity.  OT Time Calculation  OT Start Time (ACUTE ONLY) 1430  OT Stop Time (ACUTE ONLY) 1510  OT Time Calculation (min) 40 min  OT General Charges  $OT Visit 1 Visit  OT Treatments  $Therapeutic Activity 23-37 mins  Maurie Boettcher, OT/L   Acute OT Clinical Specialist [place] Pager [phone] Office [phone]

## 2019-03-24 NOTE — Progress Notes (Signed)
Subjective:  No new issues, pt awake and responsive  Objective Vital signs in last 24 hours: Vitals:   03/24/19 0800 03/24/19 0830 03/24/19 0900 03/24/19 0904  BP: 111/76 118/80 116/81   Pulse: 88 98 99 99  Resp: (!) 25 20 (!) 34 19  Temp: 98.3 F (36.8 C)     TempSrc: Oral     SpO2: 100% 94% 93% 93%  Weight:      Height:       Weight change: -1.7 kg  Intake/Output Summary (Last 24 hours) at 03/24/2019 0945 Last data filed at 03/24/2019 0915 Gross per 24 hour  Intake 1624 ml  Output 174 ml  Net 1450 ml   Physical Exam: Trach, NG tube , R chest HD cath Pt awake and alert, responsive Chest cta bilat Cor reg  Abd soft ntnd no ascites  Ext no edema LE's or UE's  BP's 100/ 88   CXR 5/17 diffuse stable infiltrates  Assessment/ Plan: Pt is a 48 y.o. yo male who was admitted on 02/23/2019 with anuric AKI due to ATN in the setting of COVID   Assessment/Plan: 1. AKI-  On CRRT since 4/21. Now off pressors w/ good BP's.  NO UOP so no signs of renal recovery yet and still requiring RRT. Now has a tunneled HD cath.  If comes off vent could possibly be transferred to [facility] to continue with intermittent HD.   - will keep + and let volume come up 2. HTN/vol- down 35-40 lbs from admit - keep + w/ CRRT for now 3. Anemia- hgb low- supportive care for now- no iron in the setting of sepsis - transfuse as needed  4. ID-  covid positive - also vanc and zosyn  5. VDRF- on vent/ trach, 40% FiO2 6.  [place]- K is fine on all 4 k bath- no change , got phos 5/13-and 5/15  - calcium OK as well   [facility] 03/24/2019, 9:45 AM    Labs: Basic Metabolic Panel: Recent Labs  Lab 03/23/19 0615 03/23/19 1625 03/24/19 0355  NA 135 136 136  K 4.8 4.6 4.5  CL 97* 99 99  CO2 26 27 28   GLUCOSE 166* 148* 124*  BUN 37* 37* 41*  CREATININE 2.04* 1.96* 2.05*  CALCIUM 10.1 10.2 10.1  PHOS 2.8 2.5 2.9   Liver Function Tests: Recent Labs  Lab 03/23/19 0615  03/23/19 1625 03/24/19 0355  AST  --   --  52*  ALT  --   --  50*  ALKPHOS  --   --  177*  BILITOT  --   --  0.4  PROT  --   --  9.4*  ALBUMIN 3.2* 3.3* 3.2*  3.1*   No results for input(s): LIPASE, AMYLASE in the last 168 hours. No results for input(s): AMMONIA in the last 168 hours. CBC: Recent Labs  Lab 03/20/19 0600 03/21/19 0640 03/22/19 0220 03/23/19 0615 03/24/19 0355  WBC 19.7* 22.6* 21.9* 22.6* 21.2*  HGB 8.9* 7.9* 8.2* 8.7* 8.0*  HCT 29.8* 27.4* 28.0* 31.0* 28.2*  MCV 101.7* 102.2* 102.9* 102.3* 103.3*  PLT 287 308 338 425* 408*   Cardiac Enzymes: No results for input(s): CKTOTAL, CKMB, CKMBINDEX, TROPONINI in the last 168 hours. CBG: Recent Labs  Lab 03/23/19 1641 03/23/19 2054 03/24/19 0005 03/24/19 0358 03/24/19 0803  GLUCAP 142* 163* 241* 118* 154*    Iron Studies: No results for input(s): IRON, TIBC, TRANSFERRIN, FERRITIN in the last 72 hours.  Studies/Results: Vas [place] Lower Extremity Venous (dvt)  Result Date: 03/22/2019  Lower Venous Study Indications: SOB, and Covid-19.  Limitations: Ventilator, boots. Comparison Study: No prior study on file for comparison Performing Technologist: Sharion Dove RVS  Examination Guidelines: A complete evaluation includes B-mode imaging, spectral Doppler, color Doppler, and power Doppler as needed of all accessible portions of each vessel. Bilateral testing is considered an integral part of a complete examination. Limited examinations for reoccurring indications may be performed as noted.  +---------+---------------+---------+-----------+----------+-------------------+ RIGHT    CompressibilityPhasicitySpontaneityPropertiesSummary             +---------+---------------+---------+-----------+----------+-------------------+ CFV      Full           Yes      Yes                                      +---------+---------------+---------+-----------+----------+-------------------+ SFJ      Full                                                              +---------+---------------+---------+-----------+----------+-------------------+ FV Prox  Full                                                             +---------+---------------+---------+-----------+----------+-------------------+ FV Mid   Full                                                             +---------+---------------+---------+-----------+----------+-------------------+ FV DistalFull                                                             +---------+---------------+---------+-----------+----------+-------------------+ PFV      Full                                                             +---------+---------------+---------+-----------+----------+-------------------+ POP      Full           Yes      Yes                                      +---------+---------------+---------+-----------+----------+-------------------+ PTV  Not all segments                                                          visualized          +---------+---------------+---------+-----------+----------+-------------------+ PERO                                                  Not visualized      +---------+---------------+---------+-----------+----------+-------------------+   +---------+---------------+---------+-----------+----------+-------------------+ LEFT     CompressibilityPhasicitySpontaneityPropertiesSummary             +---------+---------------+---------+-----------+----------+-------------------+ CFV      Full                                                             +---------+---------------+---------+-----------+----------+-------------------+ SFJ      Full                                                             +---------+---------------+---------+-----------+----------+-------------------+ FV Prox  Full                                                              +---------+---------------+---------+-----------+----------+-------------------+ FV Mid   Full                                                             +---------+---------------+---------+-----------+----------+-------------------+ FV DistalFull                                                             +---------+---------------+---------+-----------+----------+-------------------+ PFV      Full                                                             +---------+---------------+---------+-----------+----------+-------------------+ POP      Full                                                             +---------+---------------+---------+-----------+----------+-------------------+  PTV      Full                                         Not all segments                                                          visualized          +---------+---------------+---------+-----------+----------+-------------------+ PERO                                                  Not visualized      +---------+---------------+---------+-----------+----------+-------------------+     Summary: Right: There is no evidence of deep vein thrombosis in the lower extremity. However, portions of this examination were limited- see technologist comments above. Left: There is no evidence of deep vein thrombosis in the lower extremity. However, portions of this examination were limited- see technologist comments above.  *See table(s) above for measurements and observations. Electronically signed by Monica Martinez MD on 03/22/2019 at 6:11:19 PM.    Final    Medications: Infusions: .  prismasol BGK 4/2.5 300 mL/hr at 03/24/19 0726  .  prismasol BGK 4/2.5 500 mL/hr at 03/24/19 0007  . sodium chloride    . sodium chloride Stopped (03/21/19 2128)  . feeding supplement (VITAL 1.5 CAL) 45 mL/hr at 03/22/19 1600  . heparin 10,000 units/ 20 mL  infusion syringe 1,000 Units/hr (03/24/19 0900)  . heparin 600 Units/hr (03/24/19 0900)  . prismasol BGK 4/2.5 1,500 mL/hr at 03/24/19 0734    Scheduled Medications: . chlorhexidine  15 mL Mouth/Throat BID  . Chlorhexidine Gluconate Cloth  6 each Topical Daily  . famotidine  20 mg Per Tube Daily  . feeding supplement (PRO-STAT SUGAR FREE 64)  60 mL Per Tube BID  . free water  100 mL Per Tube Q8H  . insulin aspart  0-15 Units Subcutaneous Q4H  . insulin aspart  6 Units Subcutaneous Q4H  . insulin glargine  15 Units Subcutaneous BID  . mouth rinse  15 mL Mouth Rinse 10 times per day  . oxyCODONE  5 mg Oral Q8H  . sodium chloride flush  10-40 mL Intracatheter Q12H    have reviewed scheduled and prn medications.

## 2019-03-24 NOTE — Progress Notes (Signed)
Spoke with patients wife over the phone with an interpreter and updated her. Will schedule a video chat via [place] at 1700.

## 2019-03-24 NOTE — Progress Notes (Signed)
ANTICOAGULATION  CONSULT NOTE   Pharmacy Consult for Heparin Indication: Suspected PE  Not on File  Patient Measurements: Height: 5\' 5"  (165.1 cm) Weight: 133 lb 6.1 oz (60.5 kg) IBW/kg (Calculated) : 61.5 Heparin Dosing Weight: 66 kg  Vital Signs: Temp: 98.2 F (36.8 C) (05/20 0353) Temp Source: Oral (05/20 0353) BP: 110/77 (05/20 0500) Pulse Rate: 90 (05/20 0500)  Labs: Recent Labs    03/22/19 0220 03/22/19 1210  03/23/19 0615 03/23/19 1625 03/24/19 0355 03/24/19 0356  HGB 8.2*  --   --  8.7*  --  8.0*  --   HCT 28.0*  --   --  31.0*  --  28.2*  --   PLT 338  --   --  425*  --  408*  --   APTT 106*  --   --  100*  --  84*  --   HEPARINUNFRC 0.63 0.64  --  0.67  --   --  0.55  CREATININE 1.79*  --    < > 2.04* 1.96* 2.05*  --    < > = values in this interval not displayed.    Estimated Creatinine Clearance: 37.7 mL/min (A) (by C-G formula based on SCr of 2.05 mg/dL (H)).  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 CCM transitioned to heparin drip 2/2 suspected PE.   Pt currently receiving IV heparin 600 units/hr via peripheral line along with heparin 1000 units/hr via CRRT circuit. Heparin level remains therapeutic.    Today, 03/24/19   HL is 0.55, therapeutic  Hgb low but stable, plt elevated.   No bleeding or line issues noted  per RN      Goal of Therapy:  Heparin level 0.3-0.7 units/ml Monitor platelets by anticoagulation protocol: Yes   Plan:  - Continue IV heparin at 600 units/hr  - Daily CBC/HL - Monitor for bleeding  - F/u dopplers   Royetta Asal, PharmD, BCPS 03/24/2019 5:25 AM

## 2019-03-24 NOTE — Progress Notes (Signed)
Physical Therapy Evaluation Patient Details Name: Jonathan [name] MRN: [id number] DOB: 1971/03/11 Today's Date: 03/24/2019   History of Present Illness  48 y.o. male admitted on 02/23/19 with AMS, O2 sats in the ED were 50%.  Pt dx with COVID 19 and resultant ARDS.  He was intubated 4/21 adn remains intubated.  His course is complicated by acute kidney injury requiring CVVHD.    Clinical Impression  The patient found on 40% T bar, Patient assisted to sitting on bed edge then pivot transfer to recliner.  Patient tlerated well. Anxious at times, overall progressing well. Rest-101  122/89  40% Tbar 100% sao,   24 RR  sitting EOB-115/75  Then 131/75991) 100% 27 RR Sitting upright in recliner 118/77 33 RR 115 HR  96% after reclined  118/75 104 100% 24 RR  Continue PT  For mobility.       Follow Up Recommendations LTACH;CIR    Equipment Recommendations  None recommended by PT    Recommendations for Other Services Rehab consult     Precautions / Restrictions Precautions Precaution Comments: R IJ HD catheter, on /trach/T bar      Mobility  Bed Mobility Overal bed mobility: Needs Assistance Bed Mobility: Supine to Sit     Supine to sit: Max assist;+2 for physical assistance;+2 for safety/equipment     General bed mobility comments: patient moved legs towards edge, assist with trunk, Pt. assisted to scoot to bed edge.  Transfers Overall transfer level: Needs assistance   Transfers: Sit to/from [employer] Transfers Sit to Stand: Max assist;+2 physical assistance;+2 safety/equipment   Squat pivot transfers: +2 physical assistance;+2 safety/equipment;Max assist     General transfer comment: patient partially stood with bear hug support, small steps to recliner, legs  buckling.   Ambulation/Gait                Stairs            Wheelchair Mobility    Modified Rankin (Stroke Patients Only)       Balance Overall balance assessment: Needs  assistance Sitting-balance support: Bilateral upper extremity supported;Feet supported Sitting balance-Leahy Scale: Poor Sitting balance - Comments: patient did self support in sitting with close guarding briefly before transfering.                                     Pertinent Vitals/Pain Pain Score: 0-No pain Pain Descriptors / Indicators: Grimacing    Home Living                        Prior Function                 Hand Dominance        Extremity/Trunk Assessment                Communication      Cognition Arousal/Alertness: Awake/alert Behavior During Therapy: Anxious;WFL for tasks assessed/performed Overall Cognitive Status: Within Functional Limits for tasks assessed                                 General Comments: ICU RN interpreted, communicated with nodding      General Comments      Exercises     Assessment/Plan    PT Assessment    PT Problem List  PT Treatment Interventions      PT Goals (Current goals can be found in the Care Plan section)       Frequency Min 3X/week   Barriers to discharge        Co-evaluation PT/OT/SLP Co-Evaluation/Treatment: Yes Reason for Co-Treatment: Complexity of the patient's impairments (multi-system involvement);For patient/therapist safety PT goals addressed during session: Mobility/safety with mobility OT goals addressed during session: ADL's and self-care       AM-PAC PT "6 Clicks" Mobility  Outcome Measure Help needed turning from your back to your side while in a flat bed without using bedrails?: Total Help needed moving from lying on your back to sitting on the side of a flat bed without using bedrails?: Total Help needed moving to and from a bed to a chair (including a wheelchair)?: Total Help needed standing up from a chair using your arms (e.g., wheelchair or bedside chair)?: Total Help needed to walk in hospital room?: Total   6 Click  Score: 5    End of Session Equipment Utilized During Treatment: Oxygen(40% TC/8 L) Activity Tolerance: Patient tolerated treatment well Patient left: in chair;with call bell/phone within reach;with nursing/sitter in room Nurse Communication: Mobility status PT Visit Diagnosis: Muscle weakness (generalized) (M62.81);Difficulty in walking, not elsewhere classified (R26.2)    Time: 1430-1510 PT Time Calculation (min) (ACUTE ONLY): 40 min   Charges:     PT Treatments $Therapeutic Activity: 8-22 mins        Tresa Endo PT Acute Rehabilitation Services Pager [phone] Office [phone]   [name] 03/24/2019, 4:11 PM

## 2019-03-24 NOTE — Evaluation (Addendum)
Passy-Muir Speaking Valve - Evaluation Patient Details  Name: Jonathan [name] MRN: [id number] Date of Birth: December 22, 1970  Today's Date: 03/24/2019 Time: 1510-1533 SLP Time Calculation (min) (ACUTE ONLY): 23 min  Past Medical History: No past medical history on file. Past Surgical History:  The histories are not reviewed yet. Please review them in the "History" navigator section and refresh this Pass [place]. HPI:  Pt is a 48 y.o. male admitted on 02/23/19 with AMS, O2 sats in the ED were 50%.  Pt dx with COVID 19 and resultant ARDS.  He was intubated 4/21-5/11; reintubated and then trached on 5/12. His course is complicated by acute kidney injury requiring CVVHD.  PMH includes DM2, HTN.   Assessment / Plan / Recommendation Clinical Impression  Pt was seen for PMV evaluation with RN assisting with interpreting. RT was also present to provide tracheal suction prior to cuff deflation while pt had T-bar and HEPA filter in place. Coughing associated with cuff deflation was productive of secretions orally. Pt tolerated cuff deflation well with SpO2 in the high 90s and RR around 30. Initial attempt at [facility] placement lasted ~60 seconds before he began to desaturate (84%). T-bar was replaced and pt was given time to rebound. Up repeat placement, RT provided 6L via [place], which allowed pt to wear PMV for over 2 minutes prior to desaturations. While PMV was in place pt was able to blow air through his upper airway and produce minimal phonation. Vocal quality was very hoarse and soft, likely suggestive of glottal incompetence from prolonged and repeated intubations. Recommend gradually introducing PMV as pt can tolerate. For now, would attempt only with SLP present. Will continue to coordinate with RN and RT. Discussed with RT leaving his cuff deflated as much as possible while off the vent. Given the severity of his dysphonia, tenuous respiratory status, and overall deconditioning, would favor giving him additional  time to wear PMV before attempting much in terms of PO trials, although could consider initiating some swallow therapy to optimize strength of his swallowing musculature.   SLP Visit Diagnosis: Aphonia (R49.1)    SLP Assessment  Patient needs continued Speech Lanaguage Pathology Services    Follow Up Recommendations  Inpatient Rehab    Frequency and Duration min 3x week  2 weeks    PMSV Trial PMSV was placed for: ~2 min Able to redirect subglottic air through upper airway: Yes Able to Attain Phonation: Yes Voice Quality: Hoarse;Low vocal intensity;Other (comment)(borderline aphonic) Able to Expectorate Secretions: Yes Level of Secretion Expectoration with PMSV: Oral Breath Support for Phonation: Moderately decreased Intelligibility: Intelligibility reduced(across limited assessment) Word: 75-100% accurate Respirations During Trial: (low 30s) SpO2 During Trial: (starts at 100% but dropped as low as 84%) Pulse During Trial: 115 Behavior: Alert;Controlled;Cooperative   Tracheostomy Tube       Vent Dependency  FiO2 (%): 40 %    Cuff Deflation Trial  GO Tolerated Cuff Deflation: Yes Length of Time for Cuff Deflation Trial: 15 min Behavior: Alert;Controlled;Cooperative        Talbert Nan 03/24/2019, 4:41 PM   Pollyann Glen, M.A. [place] Acute Environmental education officer [phone] Office [phone]

## 2019-03-25 ENCOUNTER — Inpatient Hospital Stay (HOSPITAL_COMMUNITY): Payer: HRSA Program

## 2019-03-25 DIAGNOSIS — D649 Anemia, unspecified: Secondary | ICD-10-CM

## 2019-03-25 DIAGNOSIS — D72829 Elevated white blood cell count, unspecified: Secondary | ICD-10-CM

## 2019-03-25 LAB — RENAL FUNCTION PANEL
Albumin: 3.3 g/dL — ABNORMAL LOW (ref 3.5–5.0)
Albumin: 2.9 g/dL — ABNORMAL LOW (ref 3.5–5.0)
Anion gap: 7 (ref 5–15)
Anion gap: 9 (ref 5–15)
BUN: 32 mg/dL — ABNORMAL HIGH (ref 6–20)
BUN: 30 mg/dL — ABNORMAL HIGH (ref 6–20)
CO2: 29 mmol/L (ref 22–32)
CO2: 27 mmol/L (ref 22–32)
Calcium: 10.2 mg/dL (ref 8.9–10.3)
Calcium: 10 mg/dL (ref 8.9–10.3)
Chloride: 100 mmol/L (ref 98–111)
Chloride: 100 mmol/L (ref 98–111)
Creatinine, Ser: 1.82 mg/dL — ABNORMAL HIGH (ref 0.61–1.24)
Creatinine, Ser: 1.84 mg/dL — ABNORMAL HIGH (ref 0.61–1.24)
GFR calc Af Amer: 50 mL/min — ABNORMAL LOW (ref >60)
GFR calc Af Amer: 49 mL/min — ABNORMAL LOW (ref >60)
GFR calc non Af Amer: 43 mL/min — ABNORMAL LOW (ref >60)
GFR calc non Af Amer: 42 mL/min — ABNORMAL LOW (ref >60)
Glucose, Bld: 122 mg/dL — ABNORMAL HIGH (ref 70–99)
Glucose, Bld: 83 mg/dL (ref 70–99)
Phosphorus: 3 mg/dL (ref 2.5–4.6)
Phosphorus: 3 mg/dL (ref 2.5–4.6)
Potassium: 5 mmol/L (ref 3.5–5.1)
Potassium: 4.7 mmol/L (ref 3.5–5.1)
Sodium: 136 mmol/L (ref 135–145)
Sodium: 136 mmol/L (ref 135–145)

## 2019-03-25 LAB — CBC
HCT: 27.1 % — ABNORMAL LOW (ref 39.0–52.0)
Hemoglobin: 7.8 g/dL — ABNORMAL LOW (ref 13.0–17.0)
MCH: 29.5 pg (ref 26.0–34.0)
MCHC: 28.8 g/dL — ABNORMAL LOW (ref 30.0–36.0)
MCV: 102.7 fL — ABNORMAL HIGH (ref 80.0–100.0)
Platelets: 387 10*3/uL (ref 150–400)
RBC: 2.64 MIL/uL — ABNORMAL LOW (ref 4.22–5.81)
RDW: 17.9 % — ABNORMAL HIGH (ref 11.5–15.5)
WBC: 15.8 10*3/uL — ABNORMAL HIGH (ref 4.0–10.5)
nRBC: 0.8 % — ABNORMAL HIGH (ref 0.0–0.2)

## 2019-03-25 LAB — GLUCOSE, CAPILLARY
Glucose-Capillary: 150 mg/dL — ABNORMAL HIGH (ref 70–99)
Glucose-Capillary: 119 mg/dL — ABNORMAL HIGH (ref 70–99)
Glucose-Capillary: 168 mg/dL — ABNORMAL HIGH (ref 70–99)
Glucose-Capillary: 135 mg/dL — ABNORMAL HIGH (ref 70–99)
Glucose-Capillary: 81 mg/dL (ref 70–99)
Glucose-Capillary: 123 mg/dL — ABNORMAL HIGH (ref 70–99)

## 2019-03-25 LAB — APTT: aPTT: 111 seconds — ABNORMAL HIGH (ref 24–36)

## 2019-03-25 LAB — HEPARIN LEVEL (UNFRACTIONATED)
Heparin Unfractionated: 0.72 IU/mL — ABNORMAL HIGH (ref 0.30–0.70)
Heparin Unfractionated: 0.71 IU/mL — ABNORMAL HIGH (ref 0.30–0.70)

## 2019-03-25 LAB — MAGNESIUM: Magnesium: 3 mg/dL — ABNORMAL HIGH (ref 1.7–2.4)

## 2019-03-25 MED ORDER — HEPARIN (PORCINE) 25000 UT/250ML-% IV SOLN: 550.0000 [IU]/h | INTRAVENOUS | Status: DC

## 2019-03-25 MED ORDER — HEPARIN (PORCINE) 25000 UT/250ML-% IV SOLN
550.0000 [IU]/h | INTRAVENOUS | Status: DC
  Administered 2019-03-27: 05:00:00 550 [IU]/h via INTRAVENOUS
  Filled 2019-03-25: qty 250

## 2019-03-25 NOTE — Progress Notes (Addendum)
  Speech Language Pathology Treatment: Nada Boozer Speaking valve  Patient Details Name: Jonathan [name] MRN: [id number] DOB: [birth year] Today's Date: 03/25/2019 Time: 1287-8676 SLP Time Calculation (min) (ACUTE ONLY): 12 min  Assessment / Plan / Recommendation Clinical Impression  Pt demonstrated small but significant improvement in tolerance of PMSV today. Nasal cannula in place prior to removal of Tbar. Pt appeared anxious but benefited from verbal reinforcement of pt safety and tolerance. Pt able to wear PMSV for 3 minute intervals x2 before desaturating into 80s. During trials pt able to communicate at word level to basic orientation questions and biographical information. Pt able to cough and swallow on command though efforts appeared weak subjectively. Oral motor ROM WNL. Vocal quality breathy though phonation audible at times.  Provided basic education about situation, trach and encouraged pt that though he cant speak or eat today, with progress he will. Pt smiling and more trusting by the end of session.    HPI HPI: Pt is a 47 y.o. male admitted on 02/23/19 with AMS, O2 sats in the ED were 50%.  Pt dx with COVID 19 and resultant ARDS.  He was intubated 4/21-5/11; reintubated and then trached on 5/12. His course is complicated by acute kidney injury requiring CVVHD.  PMH includes DM2, HTN.      SLP Plan  Continue with current plan of care       Recommendations         Patient may use Passy-Muir Speech Valve: with SLP only         Plan: Continue with current plan of care       GO               Jonathan [place], MA [place] Pager [phone] Office [phone]  Jonathan [name] 03/25/2019, 1:36 PM

## 2019-03-25 NOTE — Progress Notes (Signed)
[place] for Heparin Indication: Suspected PE  Not on File  Patient Measurements: Height: 5\' 5"  (165.1 cm) Weight: 131 lb 13.4 oz (59.8 kg) IBW/kg (Calculated) : 61.5 Heparin Dosing Weight: 66 kg  Vital Signs: Temp: 98.3 F (36.8 C) (05/21 0400) Temp Source: Oral (05/21 0400) BP: 121/75 (05/21 0805) Pulse Rate: 95 (05/21 0805)  Labs: Recent Labs    03/23/19 0615 03/23/19 1625 03/24/19 0355 03/24/19 0356 03/24/19 1700 03/25/19 0657 03/25/19 0658  HGB 8.7*  --  8.0*  --   --  7.8*  --   HCT 31.0*  --  28.2*  --   --  27.1*  --   PLT 425*  --  408*  --   --  387  --   APTT 100*  --  84*  --   --   --   --   HEPARINUNFRC 0.67  --   --  0.55  --   --  0.72*  CREATININE 2.04* 1.96* 2.05*  --  1.92*  --   --     Estimated Creatinine Clearance: 39.8 mL/min (A) (by C-G formula based on SCr of 1.92 mg/dL (H)).  Assessment: 7 yom with complications from CBSWH-67 including acute respiratory failure s/p tracheostomy and ATN on CRRT now s/p permcath. CCM transitioned to heparin drip 2/2 suspected PE.   Today, 03/25/19   HL is 0.72, increased to slightly supra- therapeutic  Pt also receiving concurrent heparin 1000 units/hr via CRRT circuit.   Hgb low but stable, plt WNL  No bleeding or line issues noted  per RN   Goal of Therapy:  Heparin level 0.3-0.7 units/ml Monitor platelets by anticoagulation protocol: Yes   Plan:   Decrease to heparin IV infusion at 550 units/hr  Heparin level 8 hours after rate change  Daily heparin level and CBC  Continue to monitor H&H and platelets, bleeding.  Follow up long-term anticoagulation plans.   Gretta Arab PharmD, BCPS 03/25/2019 8:39 AM

## 2019-03-25 NOTE — Plan of Care (Signed)
  Problem: Clinical Measurements: Goal: Ability to maintain clinical measurements within normal limits will improve Outcome: Progressing Goal: Diagnostic test results will improve Outcome: Progressing Goal: Respiratory complications will improve Outcome: Progressing Goal: Cardiovascular complication will be avoided Outcome: Progressing   Problem: Activity: Goal: Risk for activity intolerance will decrease Outcome: Progressing   Problem: Nutrition: Goal: Adequate nutrition will be maintained Outcome: Progressing   Problem: Coping: Goal: Level of anxiety will decrease Outcome: Progressing   Problem: Elimination: Goal: Will not experience complications related to bowel motility Outcome: Progressing   Problem: Pain Managment: Goal: General experience of comfort will improve Outcome: Progressing   Problem: Coping: Goal: Psychosocial and spiritual needs will be supported Outcome: Progressing   Problem: Respiratory: Goal: Will maintain a patent airway Outcome: Progressing   Problem: Activity: Goal: Activity intolerance will improve Outcome: Progressing   Problem: Fluid Volume: Goal: Fluid volume balance will be maintained or improved Outcome: Progressing   Problem: Nutritional: Goal: Ability to make appropriate dietary choices will improve Outcome: Progressing

## 2019-03-25 NOTE — Progress Notes (Signed)
Occupational Therapy Treatment Patient Details Name: Jonathan [name] MRN: [id number] DOB: Feb 10, 1971 Today's Date: 03/25/2019    History of present illness 48 y.o. male admitted on 02/23/19 with AMS, O2 sats in the ED were 50%.  Pt dx with COVID 19 and resultant ARDS.  He was intubated 4/21 adn remains intubated.  His course is complicated by acute kidney injury requiring CVVHD.     OT comments  Seen this am for bed level exercise. Incorporated Salsa music into session with pt actively dancing in the bed. Pt smiling and apparently enjoying session. Spanish speaking nurse assisted with interpreting during session. Pt continues to demonstrate increased strength and endurance daily. Excellent participation. Continues to have increased weakness with ER R shoulder (? RTC insufficiency - will further assess) Pt complains of feeling "dizzy" when rolling side/side in bed. Will return after pt rests to mobilize OOB using Stedy frame. Pt in agreemetn to get OOB to chair later. Nsg aware.  Session completed on 8L TC. VSS during session with RR increasing into 30s - low 40s while dancing.   Follow Up Recommendations  CIR    Equipment Recommendations  3 in 1 bedside commode    Recommendations for Other Services Rehab consult    Precautions / Restrictions Precautions Precautions: Other (comment) Precaution Comments: R IJ HD catheter, on /trach/T bar Required Braces or Orthoses: Other Brace Other Brace: Prevalon =- no longer needs       Mobility Bed Mobility                  Transfers                      Balance                                           ADL either performed or assessed with clinical judgement   ADL                         Lower Body Dressing Details (indicate cue type and reason): [place] assisted with donning his socks prior to exericse                     Vision       Perception     Praxis       Cognition Arousal/Alertness: Awake/alert Behavior During Therapy: WFL for tasks assessed/performed Overall Cognitive Status: Difficult to assess                                 General Comments: Appesr WFL .Appropriately following commands adn interacting with therapists        Exercises General Exercises - Upper Extremity Shoulder Flexion: Both;20 reps;Supine;Seated;AROM;AAROM Shoulder ABduction: AROM;AAROM;Strengthening;Both;10 reps Shoulder Horizontal ABduction: AROM;AAROM;Both;20 reps;Strengthening;Seated Elbow Flexion: AROM;AAROM;Both;15 reps;Seated Elbow Extension: AROM;AAROM;Both;15 reps;Seated Wrist Flexion: AROM;Strengthening;Both;10 reps Wrist Extension: PROM;Both;10 reps Digit Composite Flexion: 15 reps;Squeeze ball;Both Composite Extension: Strengthening;Both;15 reps General Exercises - Lower Extremity Ankle Circles/Pumps: AROM;Both;10 reps Quad Sets: Supine;15 reps;Strengthening Short Arc Quad: AROM;AAROM;Both;10 reps;Seated Heel Slides: Both;AROM;Strengthening;15 reps;Supine Hip ABduction/ADduction: AROM;Strengthening;Both Straight Leg Raises: AROM;Both;20 reps Hip Flexion/Marching: AROM;20 reps;Both(bed level) Other Exercises Other Exercises: shoulder rolls forward/backward Other Exercises: PNF diagonals L shoulder   Shoulder Instructions       General Comments  Pertinent Vitals/ Pain       Pain Assessment: Faces Faces Pain Scale: Hurts little more Pain Location: general discomfort(with increased shoulder ROM) Pain Descriptors / Indicators: Grimacing Pain Intervention(s): Limited activity within patient's tolerance  Home Living                                          Prior Functioning/Environment              Frequency  Min 3X/week        Progress Toward Goals  OT Goals(current goals can now be found in the care plan section)  Progress towards OT goals: Progressing toward goals  Acute Rehab OT  Goals Patient Stated Goal: to get better/stronger OT Goal Formulation: With patient Time For Goal Achievement: 04/05/19 Potential to Achieve Goals: Good ADL Goals Pt Will Perform Grooming: with min assist;sitting Pt Will Perform Upper Body Bathing: with min assist;sitting Pt Will Perform Lower Body Bathing: with mod assist;bed level Additional ADL Goal #1: Pt will maintain midline postural control EOB x 10 min wiht mod A with VSS in preparation for ADL Additional ADL Goal #2: Pt will tolerate OOB in chair x 1 hour and engage in therapeutic activity.  Plan Discharge plan remains appropriate    Co-evaluation                 AM-PAC OT "6 Clicks" Daily Activity     Outcome Measure   Help from another person eating meals?: Total Help from another person taking care of personal grooming?: A Lot Help from another person toileting, which includes using toliet, bedpan, or urinal?: Total Help from another person bathing (including washing, rinsing, drying)?: A Lot Help from another person to put on and taking off regular upper body clothing?: A Lot Help from another person to put on and taking off regular lower body clothing?: A Lot 6 Click Score: 10    End of Session Equipment Utilized During Treatment: Oxygen  OT Visit Diagnosis: Other abnormalities of gait and mobility (R26.89);Muscle weakness (generalized) (M62.81);Pain Pain - Right/Left: Right Pain - part of body: Shoulder   Activity Tolerance Patient tolerated treatment well   Patient Left in bed;with nursing/sitter in room;with call bell/phone within reach   Nurse Communication Mobility status;Other (comment)(will return later)        Time: 0925-0955 OT Time Calculation (min): 30 min  Charges: OT General Charges $OT Visit: 1 Visit OT Treatments $Neuromuscular Re-education: 23-37 mins  Maurie Boettcher, OT/L   Acute OT Clinical Specialist Acute Rehabilitation Services Pager [phone] Office  [phone]    [facility] 03/25/2019, 12:46 PM

## 2019-03-25 NOTE — Progress Notes (Signed)
OT Treatment Note  Pt seen for session session to mobilize OOB to chair. Stedy used to assist with mobility. Pt able to pull into upright standing with minA. Pt initiating stand. Pt encouraged to rest at times. Improved core strength and ability to mobilize. Respiratory rate and SpO2 improved from yesterday's session. Pt appeared more comfortable with session. Excellent participation.  Encourage nsg staff to move pt into chair position in bed TID. Encourage pt to do more for himself, like pulling up his own blankets and suctioning his mouth.

## 2019-03-25 NOTE — Progress Notes (Signed)
Spoke with patient's daughter and wife via the telephone and updated them. Patient and family were also able to video chat via [facility] Will continue to monitor patient at this time.

## 2019-03-25 NOTE — Progress Notes (Signed)
OT Treatment Note  Session completed on TC (8L). Nsg interpreting during session. Spo2@ 90s throughout with RR 24-44. Able to bring RR down with encouragement to "slow breathing". Pt seen for session session to mobilize OOB to chair. Stedy used to assist with mobility. Pt able to pull into upright standing with minA, with  Pt initiating stand. Pt encouraged to rest at times as he works very hard. Improved core strength and ability to mobilize. Respiratory rate and SpO2 improved from yesterday's session. Pt appeared more comfortable with session. Excellent participation.  Encourage nsg staff to move pt into chair position in bed TID. Encourage pt to do more for himself, like pulling up his own blankets and suctioning his mouth.  Continue to recommend CIR for rehab.     03/25/19 1300  OT Visit Information  Last OT Received On 03/25/19  Assistance Needed +3 or more  PT/OT/SLP Co-Evaluation/Treatment Yes  Reason for Co-Treatment Complexity of the patient's impairments (multi-system involvement);For patient/therapist safety;To address functional/ADL transfers  OT goals addressed during session ADL's and self-care;Strengthening/ROM  History of Present Illness 48 y.o. male admitted on 02/23/19 with AMS, O2 sats in the ED were 50%.  Pt dx with COVID 19 and resultant ARDS.  He was intubated 4/21 adn remains intubated.  His course is complicated by acute kidney injury requiring CVVHD.    Precautions  Precautions Other (comment)  Precaution Comments R IJ HD catheter, on /trach/T bar  Required Braces or Orthoses Other Brace  Other Brace Prevalon =- no longer needs  Pain Assessment  Pain Assessment Faces  Faces Pain Scale 4  Pain Location general discomfort  Pain Descriptors / Indicators Grimacing  Pain Intervention(s) Limited activity within patient's tolerance  Cognition  Arousal/Alertness Awake/alert  Behavior During Therapy WFL for tasks assessed/performed  Overall Cognitive Status Difficult to  assess  General Comments Appesr [facility] .Appropriately following commands adn interacting with therapists  Difficult to assess due to Tracheostomy  Bed Mobility  Overal bed mobility Needs Assistance  Bed Mobility Supine to Sit  Supine to sit Mod assist  General bed mobility comments Pt scooting self to EOB  Balance  Overall balance assessment Needs assistance  Sitting-balance support Feet supported  Sitting balance-Leahy Scale Fair  Sitting balance - Comments kyphotic/forward head but able to move into extension  Standing balance-Leahy Scale Poor  Standing balance comment using external support/stedy frame  Vision- Assessment  Additional Comments complains of "blurry vision"; appeasr to have more difficulty sustaining visual attneiton with smooth pursuits; at times stated double vision, then said "blurry" vision. Able to read picture board and said it was "clear". Distandt vision more blurred  Transfers  Overall transfer level Needs assistance  Transfer via Actuary  Transfers Sit to/from Stand  Sit to Stand Min assist;From elevated surface  General transfer comment Initiated sit - stnad and able to stand @ 30 seconds in frame; pt asked to sit and rest  OT - End of Session  Equipment Utilized During Treatment Oxygen (8L)  Activity Tolerance Patient tolerated treatment well  Patient left in chair;with call bell/phone within reach;with nursing/sitter in room  Nurse Communication Mobility status;Need for lift equipment Charlaine Dalton)  OT Assessment/Plan  OT Plan Discharge plan remains appropriate  OT Visit Diagnosis Other abnormalities of gait and mobility (R26.89);Muscle weakness (generalized) (M62.81);Pain  Pain - Right/Left Right  Pain - part of body Shoulder  OT Frequency (ACUTE ONLY) Min 3X/week  Recommendations for Other Services Rehab consult  Follow Up Recommendations CIR  OT  Equipment 3 in 1 bedside commode  AM-PAC OT "6 Clicks" Daily Activity Outcome Measure (Version 2)   Help from another person eating meals? 1  Help from another person taking care of personal grooming? 2  Help from another person toileting, which includes using toliet, bedpan, or urinal? 1  Help from another person bathing (including washing, rinsing, drying)? 2  Help from another person to put on and taking off regular upper body clothing? 2  Help from another person to put on and taking off regular lower body clothing? 2  6 Click Score 10  OT Goal Progression  Progress towards OT goals Progressing toward goals  Acute Rehab OT Goals  Patient Stated Goal to get better/stronger  OT Goal Formulation With patient  Time For Goal Achievement 04/05/19  Potential to Achieve Goals Good  ADL Goals  Pt Will Perform Grooming with min assist;sitting  Pt Will Perform Upper Body Bathing with min assist;sitting  Pt Will Perform Lower Body Bathing with mod assist;bed level  Additional ADL Goal #1 Pt will maintain midline postural control EOB x 10 min wiht mod A with VSS in preparation for ADL  Additional ADL Goal #2 Pt will tolerate OOB in chair x 1 hour and engage in therapeutic activity.  OT Time Calculation  OT Start Time (ACUTE ONLY) 1055  OT Stop Time (ACUTE ONLY) 1137  OT Time Calculation (min) 42 min  OT General Charges  $OT Visit 1 Visit  OT Treatments  $Therapeutic Activity 8-22 mins  Maurie Boettcher, OT/L   Acute OT Clinical Specialist [place] Pager [phone] Office [phone]

## 2019-03-25 NOTE — Progress Notes (Signed)
Subjective:  Did some weaning today. Have d/w primary MD  Objective Vital signs in last 24 hours: Vitals:   03/25/19 1400 03/25/19 1500 03/25/19 1553 03/25/19 1600  BP: 121/82 124/80  124/84  Pulse: 98 95 94 95  Resp: (!) 29 (!) 23 (!) 25 (!) 22  Temp:   98.7 F (37.1 C)   TempSrc:   Oral   SpO2: 100% 100% 100% 100%  Weight:      Height:       Weight change: 1 kg  Intake/Output Summary (Last 24 hours) at 03/25/2019 1607 Last data filed at 03/25/2019 1600 Gross per 24 hour  Intake 1788.26 ml  Output 156 ml  Net 1632.26 ml   Physical Exam:   Patient not examined directly given COVID-19 + status, utilizing exam of the primary team and observations of RN's.     CXR 5/17 diffuse stable infiltrates CXR 5/21 no chg stable infiltrates  Assessment/ Plan: Pt is a 48 y.o. yo male who was admitted on 02/23/2019 with anuric AKI due to ATN in the setting of COVID   Assessment/Plan: 1. AKI-  On CRRT since 4/21. Now off pressors w/ good BP's.  NO UOP so no signs of renal recovery yet and still requiring RRT. Now has a tunneled HD cath.  If off vent and not requiring ICU care could do intermittent [facility] at [facility].  Was looking dry so have been keeping net + the last few day w/ CRRT.   - I/O +500 cc last 24 hrs - cont to keep + for now 2. HTN/vol- down 35-40 lbs from admit - keep + w/ CRRT for now 3. Anemia- hgb low- supportive care for now- no iron in the setting of sepsis. Will start esa darbe 100 ug /wk.  Cont to transfuse as needed 4. ID-  covid positive. SP IV vanc / zosyn  5. VDRF- on vent/ trach, 40% FiO2, stable 6.  [place]- K is fine on all 4 k bath- no change , got phos 5/13-and 5/15  - calcium OK as well   [facility] 03/25/2019, 4:07 PM    Labs: Basic Metabolic Panel: Recent Labs  Lab 03/24/19 0355 03/24/19 1700 03/25/19 0658  NA 136 137 136  K 4.5 4.8 5.0  CL 99 100 100  CO2 28 27 29   GLUCOSE 124* 155* 122*  BUN 41* 35* 32*  CREATININE 2.05* 1.92*  1.82*  CALCIUM 10.1 10.3 10.2  PHOS 2.9 3.0 3.0   Liver Function Tests: Recent Labs  Lab 03/24/19 0355 03/24/19 1700 03/25/19 0658  AST 52*  --   --   ALT 50*  --   --   ALKPHOS 177*  --   --   BILITOT 0.4  --   --   PROT 9.4*  --   --   ALBUMIN 3.2*  3.1* 3.1* 3.3*   No results for input(s): LIPASE, AMYLASE in the last 168 hours. No results for input(s): AMMONIA in the last 168 hours. CBC: Recent Labs  Lab 03/21/19 0640 03/22/19 0220 03/23/19 0615 03/24/19 0355 03/25/19 0657  WBC 22.6* 21.9* 22.6* 21.2* 15.8*  HGB 7.9* 8.2* 8.7* 8.0* 7.8*  HCT 27.4* 28.0* 31.0* 28.2* 27.1*  MCV 102.2* 102.9* 102.3* 103.3* 102.7*  PLT 308 338 425* 408* 387   Cardiac Enzymes: No results for input(s): CKTOTAL, CKMB, CKMBINDEX, TROPONINI in the last 168 hours. CBG: Recent Labs  Lab 03/24/19 1937 03/24/19 2309 03/25/19 0350 03/25/19 0748 03/25/19 1145  GLUCAP 149* 141* 150* 119* 168*    Iron Studies: No results for input(s): IRON, TIBC, TRANSFERRIN, FERRITIN in the last 72 hours. Studies/Results: Dg Chest Port 1 View  Result Date: 03/25/2019 CLINICAL DATA:  Follow-up pneumonia, positive COVID-19 EXAM: PORTABLE CHEST 1 VIEW COMPARISON:  03/21/2019 FINDINGS: Cardiac shadows within normal limits. Tracheostomy tube, gastric catheter and dialysis catheter are noted in satisfactory position. Diffuse airspace opacities are noted throughout both lungs stable from the prior exam. No bony abnormality is noted. IMPRESSION: No change in bilateral infiltrates. Electronically Signed   By: Inez Catalina M.D.   On: 03/25/2019 07:45   Medications: Infusions: .  prismasol BGK 4/2.5 300 mL/hr at 03/25/19 0039  .  prismasol BGK 4/2.5 500 mL/hr at 03/25/19 0717  . sodium chloride    . sodium chloride Stopped (03/21/19 2128)  . feeding supplement (VITAL 1.5 CAL) 45 mL/hr at 03/25/19 1400  . heparin 10,000 units/ 20 mL infusion syringe 1,000 Units/hr (03/25/19 1600)  . heparin 550 Units/hr (03/25/19  1400)  . prismasol BGK 4/2.5 1,500 mL/hr at 03/25/19 1439    Scheduled Medications: . chlorhexidine  15 mL Mouth/Throat BID  . Chlorhexidine Gluconate Cloth  6 each Topical Daily  . famotidine  20 mg Per Tube Daily  . feeding supplement (PRO-STAT SUGAR FREE 64)  60 mL Per Tube BID  . free water  100 mL Per Tube Q8H  . insulin aspart  0-15 Units Subcutaneous Q4H  . insulin aspart  6 Units Subcutaneous Q4H  . insulin glargine  15 Units Subcutaneous BID  . mouth rinse  15 mL Mouth Rinse 10 times per day  . oxyCODONE  5 mg Oral Q8H  . sodium chloride flush  10-40 mL Intracatheter Q12H    have reviewed scheduled and prn medications.

## 2019-03-25 NOTE — Progress Notes (Signed)
ANTICOAGULATION  CONSULT NOTE   Pharmacy Consult for Heparin Indication: Suspected PE  Not on File  Patient Measurements: Height: 5\' 5"  (165.1 cm) Weight: 131 lb 13.4 oz (59.8 kg) IBW/kg (Calculated) : 61.5 Heparin Dosing Weight: 66 kg  Vital Signs: Temp: 99 F (37.2 C) (05/21 1940) Temp Source: Oral (05/21 1940) BP: 118/75 (05/21 2100) Pulse Rate: 100 (05/21 2100)  Labs: Recent Labs    03/23/19 0615  03/24/19 0355 03/24/19 0356 03/24/19 1700 03/25/19 0657 03/25/19 0658 03/25/19 1600 03/25/19 1700  HGB 8.7*  --  8.0*  --   --  7.8*  --   --   --   HCT 31.0*  --  28.2*  --   --  27.1*  --   --   --   PLT 425*  --  408*  --   --  387  --   --   --   APTT 100*  --  84*  --   --  111*  --   --   --   HEPARINUNFRC 0.67  --   --  0.55  --   --  0.72*  --  0.71*  CREATININE 2.04*   < > 2.05*  --  1.92*  --  1.82* 1.84*  --    < > = values in this interval not displayed.    Estimated Creatinine Clearance: 41.5 mL/min (A) (by C-G formula based on SCr of 1.84 mg/dL (H)).  Assessment: 57 yom with complications from AGTXM-46 including acute respiratory failure s/p tracheostomy and ATN on CRRT now s/p permcath. CCM transitioned to heparin drip 2/2 suspected PE.   Today, 03/25/19   HL is 0.71, still slightly supra- therapeutic despite rate decrease  Pt also receiving concurrent heparin 1000 units/hr via CRRT circuit.   Hgb low but stable, plt WNL  No bleeding or line issues noted per RN   Goal of Therapy:  Heparin level 0.3-0.7 units/ml Monitor platelets by anticoagulation protocol: Yes   Plan:   Decrease to heparin IV infusion at 500 units/hr  Heparin level 8 hours after rate change with AM labs  Daily heparin level and CBC  Continue to monitor H&H and platelets, bleeding.  Follow up long-term anticoagulation plans.   Peggyann Juba, PharmD, BCPS Pharmacy: [phone] 03/25/2019 9:21 PM

## 2019-03-25 NOTE — Progress Notes (Signed)
Physical Therapy Evaluation Patient Details Name: Jonathan [name] MRN: [id number] DOB: [birth year] Today's Date: 03/25/2019   History of Present Illness  48 y.o. male admitted on 02/23/19 with AMS, O2 sats in the ED were 50%.  Pt dx with COVID 19 and resultant ARDS.  He was intubated 4/21 adn remains intubated.  His course is complicated by acute kidney injury requiring CVVHD.    Clinical Impression  Patient able to stand at stedy x 2 for transfers to recliner. Patient tolerated activity on 40% T bar. Continue mobility. ....Marland KitchenMarland KitchenBP 124/80   Pulse 95   Temp 98.1 F (36.7 C)   Resp (!) 23   Ht 5\' 5"  (1.651 m)   Wt 59.8 kg   SpO2 100%   BMI 21.94 kg/m  stable HR 101-122, Sao2 >90% on 40% T bar, BP 113/90.     Follow Up Recommendations LTACH;CIR    Equipment Recommendations  None recommended by PT    Recommendations for Other Services Rehab consult     Precautions / Restrictions Precautions Precautions: Other (comment) Precaution Comments: R IJ HD catheter, on /trach/T bar Required Braces or Orthoses: Other Brace Other Brace: Prevalon =- no longer needs      Mobility  Bed Mobility Overal bed mobility: Needs Assistance Bed Mobility: Supine to Sit     Supine to sit: Mod assist     General bed mobility comments: Pt scooting self to EOB  Transfers Overall transfer level: Needs assistance   Transfers: Sit to/from Stand Sit to Stand: Min assist;From elevated surface         General transfer comment: Initiated sit - stand and able to stand @ 30 seconds in frame; pt asked to sit and rest  Ambulation/Gait                Stairs            Wheelchair Mobility    Modified Rankin (Stroke Patients Only)       Balance Overall balance assessment: Needs assistance Sitting-balance support: Feet supported Sitting balance-Leahy Scale: Fair Sitting balance - Comments: kyphotic/forward head but able to move into extension     Standing balance-Leahy  Scale: Poor Standing balance comment: using external support/stedy frame                             Pertinent Vitals/Pain Pain Assessment: Faces Faces Pain Scale: Hurts little more Pain Location: general discomfort Pain Descriptors / Indicators: Grimacing Pain Intervention(s): Monitored during session    Home Living                        Prior Function                 Hand Dominance        Extremity/Trunk Assessment                Communication      Cognition Arousal/Alertness: Awake/alert Behavior During Therapy: WFL for tasks assessed/performed Overall Cognitive Status: Difficult to assess                                 General Comments: Appesr WFL .Appropriately following commands adn interacting with therapists  via [place] interpreter      General Comments      Exercises     Assessment/Plan    PT Assessment  PT Problem List         PT Treatment Interventions      PT Goals (Current goals can be found in the Care Plan section)  Acute Rehab PT Goals Patient Stated Goal: to get better/stronger    Frequency Min 3X/week   Barriers to discharge        Co-evaluation PT/OT/SLP Co-Evaluation/Treatment: Yes Reason for Co-Treatment: Complexity of the patient's impairments (multi-system involvement);For patient/therapist safety PT goals addressed during session: Mobility/safety with mobility OT goals addressed during session: ADL's and self-care       AM-PAC PT "6 Clicks" Mobility  Outcome Measure Help needed turning from your back to your side while in a flat bed without using bedrails?: Total Help needed moving from lying on your back to sitting on the side of a flat bed without using bedrails?: Total Help needed moving to and from a bed to a chair (including a wheelchair)?: Total Help needed standing up from a chair using your arms (e.g., wheelchair or bedside chair)?: Total Help needed to  walk in hospital room?: Total Help needed climbing 3-5 steps with a railing? : Total 6 Click Score: 6    End of Session Equipment Utilized During Treatment: Oxygen(T Bar 40%) Activity Tolerance: Patient tolerated treatment well Patient left: in chair;with call bell/phone within reach;with nursing/sitter in room Nurse Communication: Mobility status PT Visit Diagnosis: Muscle weakness (generalized) (M62.81);Difficulty in walking, not elsewhere classified (R26.2)    Time: 1055-1130 PT Time Calculation (min) (ACUTE ONLY): 35 min   Charges:     PT Treatments $Therapeutic Activity: 8-22 mins        Tresa Endo PT Acute Rehabilitation Services Pager [phone] Office [phone]   [name] 03/25/2019, 3:36 PM

## 2019-03-25 NOTE — Progress Notes (Signed)
NAME:  Jonathan [name], MRN:  [id number], DOB:  [birth year], LOS: 76 ADMISSION DATE:  02/23/2019, CONSULTATION DATE:  02/23/19 REFERRING MD:  Regenia Skeeter, CHIEF COMPLAINT:  Dyspnea   Brief History   48 year old male admitted on February 23, 2019 with ARDS due to COVID-19.  He has largely recovered from severe ARDS which originally required prone positioning for many days, he developed acute kidney injury requiring continuous renal replacement therapy.  Past Medical History  DM2 HTN  Significant Hospital Events   4/21 admitted and immediately proned on inverse ratio PC ventilation with high PEEP to maintain saturation >85%, Started on NO at 40ppm. 4/22- Tocluzimab X 1 4/22- started CVVHD 4/21-4/24 Proned daily 4/28 -4/30 proned daily , tapered NO to off  4/30 -late evening with increased O2 demands . No paralytics required x 24hr . No vent dysyncrony . Calm on Fent /Versed. Off precedex   5/1 - Remains on CRRT , no acute issues overnight --4.8 L I/O Balance since admit . Continues to Prone on/off 16 on and 8 off . -CXR stable diffuse bilateral infiltrates (slightly improved aeration ) . , Vent .80Fio2 .  5/3 convalescent plasma  Biomarkes improving but severe ARDS persists ad D-dimer still very high (13) without improvement. On IV Heparin gtt. Growing MSSA. SEdation changed to dilaudid gtt. Continues prone 16h and 8h syupine. Currently supine at 80% fio2, peep 18. On TF. STARTED NIMBEX  5/4-5/5-able to stay in the supine position, able to discontinue paralysis and maintain FiO2 0.50, PEEP 8-10.  5/5 hematochezia, bloody gastric aspirate  5/8 followed commands  5/11 extubated  5/12 re-intubated, tracheostomy  5/13 perm cath  5/14 worsening hypoxemia, shock suddenly, RV dilated, no new CXR changes, treated for PE empirically  Consults:  PCCM 4/21  Procedures:  4/21 - ETT 7.5 >> 5/12 5/12 Trach >>  4/21 - RIJ CVC [facility] >> 4/22 L IJ HD Cath>5/15 5/13 R IJ  Permcath>    Significant Diagnostic Tests:  POC U/S 4/21: Bilateral interstitial pattern with bilateral dense consolidation at bases. Echo shows low-normal LVSF. RV moderately dilated with septal shift.  5/14 Echo> LVEF > 65%, RV dilated, decreased RV function (moderate) with RVSP 60  Micro Data:  Blood cultures -PND SARS-CoV2 02/23/2019 - - POSITIVE HIV 4/21 -neg Quant gold 4/2 1- neg .....................Marland Kitchen resp 4/29 >>rare MSSA and yeast  5/10 blood > neg 5/18 c. Diff > neg  Anti-covid RX and antimicrobial RX  Azithromycin 4/21 >>4/25 Ceftriaxone 4/22 >>4/25 Actemra 4/22 Hydroxychloroquine 4/21 > 4/22 ........................... Ceftriaxone 5/2 >>5/10 Cefepime 5/10>>> 5/12 Zosyn 5/12 >> 5/17 Vancomycin 5/10>>>5/17  Fluconazole 5/12 > 5/17   Interim history/subjective:   Tolerated trach collar yesterday for 8 to 10 hours Use Passy-Muir valve for a few minutes Continues to work with physical therapy Remains on CVVHD Breathing is comfortable this morning, he is smiling, working with staff  Objective   Blood pressure 121/75, pulse 95, temperature 98.3 F (36.8 C), temperature source Oral, resp. rate (!) 27, height 5\' 5"  (1.651 m), weight 59.8 kg, SpO2 99 %.    Vent Mode: PCV FiO2 (%):  [40 %] 40 % Set Rate:  [20 bmp] 20 bmp Vt Set:  [370 mL] 370 mL PEEP:  [5 cmH20] 5 cmH20 Plateau Pressure:  [19 cmH20-25 cmH20] 22 cmH20   Intake/Output Summary (Last 24 hours) at 03/25/2019 0902 Last data filed at 03/25/2019 0800 Gross per 24 hour  Intake 1649.84 ml  Output 205 ml  Net 1444.84 ml  Filed Weights   03/23/19 0500 03/24/19 0500 03/25/19 0700  Weight: 60.5 kg 58.8 kg 59.8 kg    Examination:  General:  In bed off vent HENT: NCAT trach in place PULM: Rhonchi bilaterally, normal effort CV: RRR, no mgr GI: BS+, soft, nontender MSK: normal bulk and tone Neuro: awake, alert, following commands   Resolved Hospital Problem list     Assessment & Plan:   Likely pulmonary embolism: Hypoxemia last week, RV dilation on echo Would prefer to forgo CT angiogram because of renal function (possible recovery?) Continue full dose anticoagulation From my standpoint okay to transition to Eliquis Would plan 3 months of empiric pulmonary embolism treatment   Acute respiratory failure with hypoxemia, recovering ARDS from COVID-19 Tracheostomy collar as long as tolerated today, from my standpoint he could probably go 24 hours if he does not feel short of breath or hypoxemic tonight Cuff down today, it work with speaking valve Ventilator associated pneumonia prevention protocol Tracheostomy care per routine, remove sutures today   Acute kidney injury, oliguric, doubt there will be any recovery of renal function Continue CVVHD until liberated from ventilator PermCath care per routine Plan for transition to [facility]  Need for sedation for mechanical ventilation: Resolved Continue scheduled and as needed pain medicine  Best practice:  Diet: tube feeding, hopeful to avoid PEG Pain/Anxiety/Delirium protocol (if indicated): minimize VAP protocol (if indicated): yes DVT prophylaxis: heparin infusion GI prophylaxis: famotidine Glucose control: SSI Mobility: PT working with patient Code Status: full Family Communication: updated via video chat 5/20, [facility] updating them today Disposition: remain in ICU  Labs   CBC: Recent Labs  Lab 03/21/19 0640 03/22/19 0220 03/23/19 0615 03/24/19 0355 03/25/19 0657  WBC 22.6* 21.9* 22.6* 21.2* 15.8*  HGB 7.9* 8.2* 8.7* 8.0* 7.8*  HCT 27.4* 28.0* 31.0* 28.2* 27.1*  MCV 102.2* 102.9* 102.3* 103.3* 102.7*  PLT 308 338 425* 408* 341    Basic Metabolic Panel: Recent Labs  Lab 03/20/19 0600  03/21/19 0640  03/22/19 0220 03/22/19 1555 03/23/19 0615 03/23/19 1625 03/24/19 0355 03/24/19 1700  NA 136   < > 135   < > 135 135 135 136 136 137  K 4.9   < > 5.2*   < > 4.8 5.0 4.8 4.6 4.5 4.8  CL 96*    < > 98   < > 98 98 97* 99 99 100  CO2 27   < > 26   < > 25 26 26 27 28 27   GLUCOSE 120*   < > 146*   < > 193* 103* 166* 148* 124* 155*  BUN 36*   < > 34*   < > 36* 34* 37* 37* 41* 35*  CREATININE 1.99*   < > 1.89*   < > 1.79* 1.93* 2.04* 1.96* 2.05* 1.92*  CALCIUM 10.1   < > 9.5   < > 9.6 10.0 10.1 10.2 10.1 10.3  MG 3.4*  --  3.0*  --  3.1*  --  3.2*  --  3.0*  --   PHOS 3.6   < > 2.9   < > 3.6 3.0 2.8 2.5 2.9 3.0   < > = values in this interval not displayed.   GFR: Estimated Creatinine Clearance: 39.8 mL/min (A) (by C-G formula based on SCr of 1.92 mg/dL (H)). Recent Labs  Lab 03/22/19 0220 03/23/19 0615 03/24/19 0355 03/25/19 0657  WBC 21.9* 22.6* 21.2* 15.8*    Liver Function Tests: Recent Labs  Lab 03/22/19  1555 03/23/19 0615 03/23/19 1625 03/24/19 0355 03/24/19 1700  AST  --   --   --  52*  --   ALT  --   --   --  50*  --   ALKPHOS  --   --   --  177*  --   BILITOT  --   --   --  0.4  --   PROT  --   --   --  9.4*  --   ALBUMIN 3.2* 3.2* 3.3* 3.2*  3.1* 3.1*   No results for input(s): LIPASE, AMYLASE in the last 168 hours. No results for input(s): AMMONIA in the last 168 hours.  ABG    Component Value Date/Time   PHART 7.381 03/18/2019 1112   PCO2ART 42.1 03/18/2019 1112   PO2ART 50.0 (L) 03/18/2019 1112   HCO3 25.1 03/18/2019 1112   TCO2 26 03/18/2019 1112   ACIDBASEDEF 1.0 03/16/2019 0450   O2SAT 85.0 03/18/2019 1112     Coagulation Profile: No results for input(s): INR, PROTIME in the last 168 hours.  Cardiac Enzymes: No results for input(s): CKTOTAL, CKMB, CKMBINDEX, TROPONINI in the last 168 hours.  HbA1C: Hgb A1c MFr Bld  Date/Time Value Ref Range Status  03/23/2019 06:15 AM 5.5 4.8 - 5.6 % Final    Comment:    (NOTE) Pre diabetes:          5.7%-6.4% Diabetes:              >6.4% Glycemic control for   <7.0% adults with diabetes     CBG: Recent Labs  Lab 03/24/19 1556 03/24/19 1937 03/24/19 2309 03/25/19 0350 03/25/19 0748   GLUCAP 108* 149* 141* 150* 119*     Critical care time: 31 minutes     Roselie Awkward, MD [place] [facility] Pager: [phone] Cell: [phone] If no response, call [phone]

## 2019-03-25 NOTE — Progress Notes (Signed)
Patient has a pending heparin level due at 1700. Unable to draw lab from pt's permicath because it has heparin going through it continuously. Pharmacy is aware. I have attempted to contact lab/phelbaotomist and have not been able to get in touch wih anyone. Estill Bamberg, CN also has tried to contact them at this time. Will let night shift RN know.

## 2019-03-25 NOTE — Progress Notes (Signed)
Assisted tele visit to patient with family member.  Kahla Risdon Samson, RN  

## 2019-03-25 NOTE — Progress Notes (Addendum)
PROGRESS NOTE  Jonathan [name] XHB:[id number] DOB: [birth year] DOA: 02/23/2019  PCP: Patient, No Pcp Per  Brief History/Interval Summary: 48 y.o. male [place] speaking, arrived from [place] on 02/13/2019 and was living in close quarters with 6 other workers. He developed increasing rrespiratory distress and was intubated for ARDS due to COVID-19 infection. He was started on CRRT due to ATN, paralyzed and prone. He was transferred to [facility] for further management. He was eventually extubated, but quickly became short of breath again and aspirated when he started vomiting. He was re intubated and ended up with tracheostomy.   Events: 02/23/2019 admitted to the ICU. 02/24/2019 started on CRRT 4/21-4/24 Proned daily 4/28 -4/30 proned daily  03/04/2019 increase in oxygen demand require paralytic for 24 hours. 03/09/2019 hematochezia from gastric aspirate  Lines and tubes: 4/21 - ETT >>5.11.2020 4/21 - RIJ CVC 4/22 L IJ HD Cath> 5/13 Permacath right  Antibiotics: 02/23/2019-425 2020 azithromycin and Rocephin 02/23/2019-02/24/2019 03/06/2019 Rocephin 5/11-5/17 Vancomycin 5/12-5/18 Zosyn 5/12-5/17 Fluconazole for candida in tracheal aspirate and recent Actemra  Consultants: Pulmonology.  Nephrology.   Subjective/Interval History: Patient awake alert.  Following commands.  Trach collar this morning.     Assessment/Plan:  Acute Hypoxic Resp. Failure due to Acute Covid 19 Viral Illness/ARDS  Fever: Has been afebrile the last several days Oxygen requirements: Tracheostomy status.  Collar for many hours yesterday.  Placed back on ventilator support overnight.  40% FiO2.  Saturating in the 90s.   Antibiotics: Not on any antibiotics currently Steroids: Not on steroids at this time Diuretics: Not on any diuretics Actemra: Received on 4/22 Convalescent Plasma: On 5/3 Vitamin C and Zinc: None  Patient remains stable from a respiratory standpoint.  He did well with the being on  trach collar most of afternoon and evening yesterday.  Was placed on ventilator support overnight.  On trach collar this morning.  Pulmonology is following.  Oxygenating well.  He has completed course of antibiotics and steroids.   PT and OT following.  Could be a candidate for CIR.  Presumed acute PE Patient had an acute episode of hypoxia with respiratory decompensation.  Echocardiogram showed right-sided strain.  Patient was empiricaly placed on IV heparin due to concern for underlying pulmonary embolism.  Lower extremity Doppler studies negative.  Continue IV heparin for now.  Will need to consider transition to an enteral agent such as Eliquis soon.  Treat for 3 months.  Septic shock Patient has been weaned off of pressors.  Acute kidney injury due to ATN Remains on CRRT.  Nephrology is following and managing.  Still not making any urine.  Discussed with Dr. Jonnie Finner yesterday.  Once patient is off of ventilator and out of the ICU he will need intermittent dialysis for which he will need to be transferred to [facility].   Anemia due to chronic illness and possible acute GI blood loss No further bleeding has been noted.  Hemoglobin has been stable for the most part.    Leukocytosis Patient is afebrile.  Reason for elevated WBC is not entirely clear.  Due to reports of loose stools stool studies were sent and was negative for C. difficile.  WBC noted to be better this morning.  Continue to monitor.    Diabetes mellitus type 2 Patient currently on basal insulin and sliding scale coverage.  Monitor CBGs.  HbA1c 5.5.  CBGs are reasonably well controlled.  Continue to monitor for now.  FEN On IV fluids.  Potassium 5.0 this  morning.  On tube feedings.    Pressure injury on the skin of nose Local wound care.   DVT Prophylaxis: On heparin infusion  Lab Results  Component Value Date   PLT 387 03/25/2019     PUD Prophylaxis: On famotidine Code Status: Partial code Family  Communication:  Wife Energy manager number [phone].  Discussed with his daughter yesterday. Disposition Plan: Patient will remain in ICU for now   Medications:  Scheduled: . chlorhexidine  15 mL Mouth/Throat BID  . Chlorhexidine Gluconate Cloth  6 each Topical Daily  . famotidine  20 mg Per Tube Daily  . feeding supplement (PRO-STAT SUGAR FREE 64)  60 mL Per Tube BID  . free water  100 mL Per Tube Q8H  . insulin aspart  0-15 Units Subcutaneous Q4H  . insulin aspart  6 Units Subcutaneous Q4H  . insulin glargine  15 Units Subcutaneous BID  . mouth rinse  15 mL Mouth Rinse 10 times per day  . oxyCODONE  5 mg Oral Q8H  . sodium chloride flush  10-40 mL Intracatheter Q12H   Continuous: .  prismasol BGK 4/2.5 300 mL/hr at 03/25/19 0039  .  prismasol BGK 4/2.5 500 mL/hr at 03/25/19 0717  . sodium chloride    . sodium chloride Stopped (03/21/19 2128)  . feeding supplement (VITAL 1.5 CAL) 45 mL/hr at 03/25/19 0700  . heparin 10,000 units/ 20 mL infusion syringe 1,000 Units/hr (03/25/19 1300)  . heparin 550 Units/hr (03/25/19 0849)  . prismasol BGK 4/2.5 1,500 mL/hr at 03/25/19 1138   KPT:WSFKC/LEXNTZGY arterial line **AND** sodium chloride, sodium chloride, acetaminophen (TYLENOL) oral liquid 160 mg/5 mL, glycopyrrolate, haloperidol lactate, heparin, heparin, heparin, HYDROmorphone (DILAUDID) injection, metoprolol tartrate, midazolam, ondansetron (ZOFRAN) IV, oxyCODONE, sodium chloride flush   Objective:  Vital Signs  Vitals:   03/25/19 1130 03/25/19 1147 03/25/19 1200 03/25/19 1300  BP: 120/88 118/83 118/83 120/84  Pulse: 95 95 98 (!) 111  Resp: (!) 32 (!) 28 (!) 30 (!) 26  Temp:  98.1 F (36.7 C)    TempSrc:      SpO2: 100% 100% 96% (!) 86%  Weight:      Height:        Intake/Output Summary (Last 24 hours) at 03/25/2019 1309 Last data filed at 03/25/2019 1300 Gross per 24 hour  Intake 1706.26 ml  Output 242 ml  Net 1464.26 ml   Filed Weights    03/23/19 0500 03/24/19 0500 03/25/19 0700  Weight: 60.5 kg 58.8 kg 59.8 kg   General appearance: Awake alert.  In no distress. Tracheostomy noted Resp: Coarse breath sounds bilaterally with a few wheezes.  Crackles at the bases.   Cardio: S1-S2 is normal regular.  No S3-S4.  No rubs murmurs or bruit GI: Abdomen is soft.  Nontender nondistended.  Bowel sounds are present normal.  No masses organomegaly Extremities: No edema.  Moving all his extremities. Neurologic: No focal neurological deficits.    Lab Results:  Data Reviewed: I have personally reviewed following labs and imaging studies  CBC: Recent Labs  Lab 03/21/19 0640 03/22/19 0220 03/23/19 0615 03/24/19 0355 03/25/19 0657  WBC 22.6* 21.9* 22.6* 21.2* 15.8*  HGB 7.9* 8.2* 8.7* 8.0* 7.8*  HCT 27.4* 28.0* 31.0* 28.2* 27.1*  MCV 102.2* 102.9* 102.3* 103.3* 102.7*  PLT 308 338 425* 408* 174    Basic Metabolic Panel: Recent Labs  Lab 03/21/19 0640  03/22/19 0220  03/23/19 0615 03/23/19 1625 03/24/19 0355 03/24/19 1700 03/25/19 0657 03/25/19 0658  NA  135   < > 135   < > 135 136 136 137  --  136  K 5.2*   < > 4.8   < > 4.8 4.6 4.5 4.8  --  5.0  CL 98   < > 98   < > 97* 99 99 100  --  100  CO2 26   < > 25   < > 26 27 28 27   --  29  GLUCOSE 146*   < > 193*   < > 166* 148* 124* 155*  --  122*  BUN 34*   < > 36*   < > 37* 37* 41* 35*  --  32*  CREATININE 1.89*   < > 1.79*   < > 2.04* 1.96* 2.05* 1.92*  --  1.82*  CALCIUM 9.5   < > 9.6   < > 10.1 10.2 10.1 10.3  --  10.2  MG 3.0*  --  3.1*  --  3.2*  --  3.0*  --  3.0*  --   PHOS 2.9   < > 3.6   < > 2.8 2.5 2.9 3.0  --  3.0   < > = values in this interval not displayed.    GFR: Estimated Creatinine Clearance: 42 mL/min (A) (by C-G formula based on SCr of 1.82 mg/dL (H)).  Liver Function Tests: Recent Labs  Lab 03/23/19 0615 03/23/19 1625 03/24/19 0355 03/24/19 1700 03/25/19 0658  AST  --   --  52*  --   --   ALT  --   --  50*  --   --   ALKPHOS  --   --   177*  --   --   BILITOT  --   --  0.4  --   --   PROT  --   --  9.4*  --   --   ALBUMIN 3.2* 3.3* 3.2*  3.1* 3.1* 3.3*    CBG: Recent Labs  Lab 03/24/19 1937 03/24/19 2309 03/25/19 0350 03/25/19 0748 03/25/19 1145  GLUCAP 149* 141* 150* 119* 168*     Recent Results (from the past 240 hour(s))  C difficile quick scan w PCR reflex     Status: None   Collection Time: 03/22/19  3:26 PM  Result Value Ref Range Status   C Diff antigen NEGATIVE NEGATIVE Final   C Diff toxin NEGATIVE NEGATIVE Final   C Diff interpretation No C. difficile detected.  Final    Comment: Performed at [facility], [place] [address], [place], [place] [address]      Radiology Studies: Dg Chest Port 1 View  Result Date: 03/25/2019 CLINICAL DATA:  Follow-up pneumonia, positive COVID-19 EXAM: PORTABLE CHEST 1 VIEW COMPARISON:  03/21/2019 FINDINGS: Cardiac shadows within normal limits. Tracheostomy tube, gastric catheter and dialysis catheter are noted in satisfactory position. Diffuse airspace opacities are noted throughout both lungs stable from the prior exam. No bony abnormality is noted. IMPRESSION: No change in bilateral infiltrates. Electronically Signed   By: Inez Catalina M.D.   On: 03/25/2019 07:45       LOS: 30 days   [facility] Pager on [email or web address]  03/25/2019, 1:09 PM

## 2019-03-25 NOTE — Progress Notes (Signed)
Patient did very well getting into chair with the assistance of PT/OT using the steady lift at 1130. Patient remained in chair for about 5.5 hours. He remained on t-bar at 40% and VS WNL's while in chair. Patient was placed back in bed with the assistance of three RN's. Will continue to monitor patient.

## 2019-03-26 ENCOUNTER — Inpatient Hospital Stay (HOSPITAL_COMMUNITY): Payer: HRSA Program

## 2019-03-26 LAB — MAGNESIUM: Magnesium: 3 mg/dL — ABNORMAL HIGH (ref 1.7–2.4)

## 2019-03-26 LAB — RENAL FUNCTION PANEL
Albumin: 2.8 g/dL — ABNORMAL LOW (ref 3.5–5.0)
Albumin: 3 g/dL — ABNORMAL LOW (ref 3.5–5.0)
Anion gap: 7 (ref 5–15)
Anion gap: 7 (ref 5–15)
BUN: 30 mg/dL — ABNORMAL HIGH (ref 6–20)
BUN: 35 mg/dL — ABNORMAL HIGH (ref 6–20)
CO2: 27 mmol/L (ref 22–32)
CO2: 28 mmol/L (ref 22–32)
Calcium: 10 mg/dL (ref 8.9–10.3)
Calcium: 10 mg/dL (ref 8.9–10.3)
Chloride: 101 mmol/L (ref 98–111)
Chloride: 100 mmol/L (ref 98–111)
Creatinine, Ser: 1.83 mg/dL — ABNORMAL HIGH (ref 0.61–1.24)
Creatinine, Ser: 1.95 mg/dL — ABNORMAL HIGH (ref 0.61–1.24)
GFR calc Af Amer: 49 mL/min — ABNORMAL LOW (ref >60)
GFR calc Af Amer: 46 mL/min — ABNORMAL LOW (ref >60)
GFR calc non Af Amer: 43 mL/min — ABNORMAL LOW (ref >60)
GFR calc non Af Amer: 40 mL/min — ABNORMAL LOW (ref >60)
Glucose, Bld: 124 mg/dL — ABNORMAL HIGH (ref 70–99)
Glucose, Bld: 99 mg/dL (ref 70–99)
Phosphorus: 2.9 mg/dL (ref 2.5–4.6)
Phosphorus: 3 mg/dL (ref 2.5–4.6)
Potassium: 5.5 mmol/L — ABNORMAL HIGH (ref 3.5–5.1)
Potassium: 5.3 mmol/L — ABNORMAL HIGH (ref 3.5–5.1)
Sodium: 135 mmol/L (ref 135–145)
Sodium: 135 mmol/L (ref 135–145)

## 2019-03-26 LAB — APTT: aPTT: 72 seconds — ABNORMAL HIGH (ref 24–36)

## 2019-03-26 LAB — CBC
HCT: 24.6 % — ABNORMAL LOW (ref 39.0–52.0)
Hemoglobin: 7 g/dL — ABNORMAL LOW (ref 13.0–17.0)
MCH: 29.5 pg (ref 26.0–34.0)
MCHC: 28.5 g/dL — ABNORMAL LOW (ref 30.0–36.0)
MCV: 103.8 fL — ABNORMAL HIGH (ref 80.0–100.0)
Platelets: 346 10*3/uL (ref 150–400)
RBC: 2.37 MIL/uL — ABNORMAL LOW (ref 4.22–5.81)
RDW: 17.6 % — ABNORMAL HIGH (ref 11.5–15.5)
WBC: 13.1 10*3/uL — ABNORMAL HIGH (ref 4.0–10.5)
nRBC: 0.2 % (ref 0.0–0.2)

## 2019-03-26 LAB — GLUCOSE, CAPILLARY
Glucose-Capillary: 102 mg/dL — ABNORMAL HIGH (ref 70–99)
Glucose-Capillary: 122 mg/dL — ABNORMAL HIGH (ref 70–99)
Glucose-Capillary: 133 mg/dL — ABNORMAL HIGH (ref 70–99)
Glucose-Capillary: 139 mg/dL — ABNORMAL HIGH (ref 70–99)
Glucose-Capillary: 144 mg/dL — ABNORMAL HIGH (ref 70–99)
Glucose-Capillary: 156 mg/dL — ABNORMAL HIGH (ref 70–99)
Glucose-Capillary: 96 mg/dL (ref 70–99)

## 2019-03-26 LAB — HEPARIN LEVEL (UNFRACTIONATED)
Heparin Unfractionated: 0.33 IU/mL (ref 0.30–0.70)
Heparin Unfractionated: 0.12 IU/mL — ABNORMAL LOW (ref 0.30–0.70)

## 2019-03-26 MED ORDER — INSULIN GLARGINE 100 UNIT/ML ~~LOC~~ SOLN
10.0000 [IU] | Freq: Two times a day (BID) | SUBCUTANEOUS | Status: DC
  Administered 2019-03-26 – 2019-03-30 (×8): 10 [IU] via SUBCUTANEOUS
  Filled 2019-03-26 (×9): qty 0.1

## 2019-03-26 MED ORDER — SODIUM CHLORIDE 0.9 % IV SOLN
500.0000 [IU]/h | INTRAVENOUS | Status: DC
  Administered 2019-03-26: 500 [IU]/h via INTRAVENOUS_CENTRAL
  Filled 2019-03-26: qty 2
  Filled 2019-03-26: qty 10000

## 2019-03-26 NOTE — Progress Notes (Signed)
NAME:  Jonathan [name], MRN:  [id number], DOB:  1971-04-13, LOS: 64 ADMISSION DATE:  02/23/2019, CONSULTATION DATE:  02/23/19 REFERRING MD:  Regenia Skeeter, CHIEF COMPLAINT:  Dyspnea   Brief History   48 year old male admitted on February 23, 2019 with ARDS due to COVID-19.  He has largely recovered from severe ARDS which originally required prone positioning for many days, he developed acute kidney injury requiring continuous renal replacement therapy.  Past Medical History  DM2 HTN  Significant Hospital Events   4/21 admitted and immediately proned on inverse ratio PC ventilation with high PEEP to maintain saturation >85%, Started on NO at 40ppm. 4/22- Tocluzimab X 1 4/22- started CVVHD 4/21-4/24 Proned daily 4/28 -4/30 proned daily , tapered NO to off  4/30 -late evening with increased O2 demands . No paralytics required x 24hr . No vent dysyncrony . Calm on Fent /Versed. Off precedex   5/1 - Remains on CRRT , no acute issues overnight --4.8 L I/O Balance since admit . Continues to Prone on/off 16 on and 8 off . -CXR stable diffuse bilateral infiltrates (slightly improved aeration ) . , Vent .80Fio2 .  5/3 convalescent plasma  Biomarkes improving but severe ARDS persists ad D-dimer still very high (13) without improvement. On IV Heparin gtt. Growing MSSA. SEdation changed to dilaudid gtt. Continues prone 16h and 8h syupine. Currently supine at 80% fio2, peep 18. On TF. STARTED NIMBEX  5/4-5/5-able to stay in the supine position, able to discontinue paralysis and maintain FiO2 0.50, PEEP 8-10.  5/5 hematochezia, bloody gastric aspirate  5/8 followed commands  5/11 extubated  5/12 re-intubated, tracheostomy  5/13 perm cath  5/14 worsening hypoxemia, shock suddenly, RV dilated, no new CXR changes, treated for PE empirically  Consults:  PCCM 4/21  Procedures:  4/21 - ETT 7.5 >> 5/12 5/12 Trach >>  4/21 - RIJ CVC [facility] >> 4/22 L IJ HD Cath>5/15 5/13 R IJ  Permcath>    Significant Diagnostic Tests:  POC U/S 4/21: Bilateral interstitial pattern with bilateral dense consolidation at bases. Echo shows low-normal LVSF. RV moderately dilated with septal shift.  5/14 Echo> LVEF > 65%, RV dilated, decreased RV function (moderate) with RVSP 60  Micro Data:  Blood cultures -PND SARS-CoV2 02/23/2019 - - POSITIVE HIV 4/21 -neg Quant gold 4/2 1- neg .....................Marland Kitchen resp 4/29 >>rare MSSA and yeast  5/10 blood > neg 5/18 c. Diff > neg  Anti-covid RX and antimicrobial RX  Azithromycin 4/21 >>4/25 Ceftriaxone 4/22 >>4/25 Actemra 4/22 Hydroxychloroquine 4/21 > 4/22 ........................... Ceftriaxone 5/2 >>5/10 Cefepime 5/10>>> 5/12 Zosyn 5/12 >> 5/17 Vancomycin 5/10>>>5/17  Fluconazole 5/12 > 5/17   Interim history/subjective:   Patient is been tolerating ATC for 24 hours Tolerated PMV for 2 hours, weak voice reported Working with physical therapy  Objective   Blood pressure 125/75, pulse (!) 111, temperature 99 F (37.2 C), temperature source Oral, resp. rate (!) 25, height 5\' 5"  (1.651 m), weight 59.9 kg, SpO2 94 %.    FiO2 (%):  [30 %-40 %] 30 %   Intake/Output Summary (Last 24 hours) at 03/26/2019 1345 Last data filed at 03/26/2019 1300 Gross per 24 hour  Intake 1718.89 ml  Output 557 ml  Net 1161.89 ml   Filed Weights   03/24/19 0500 03/25/19 0700 03/26/19 0500  Weight: 58.8 kg 59.8 kg 59.9 kg    Examination:  General: Ill-appearing man, no distress HENT: Trach in place, CDI, on trach collar currently PULM: Bilateral rhonchi, squeaks, no wheezing CV: Regular,  no murmur GI: Soft, nondistended, positive bowel sounds MSK: No deformities Skin: Healing pressure injuries bilateral maxillary region Neuro: Awake, alert, interacts, follows commands, nods to questions   Resolved Hospital Problem list     Assessment & Plan:  Likely pulmonary embolism: Hypoxemia last week, RV dilation on echo Would prefer  to forgo CT angiogram because of renal function (possible recovery?) Continue heparin infusion for now.  His hemoglobin is drifted down and some pink trach secretions reported. Plan to transition to Eliquis once we ensure hemoglobin stable Plan for 3 months empiric anticoagulation to treat presumed pulmonary embolism   Acute respiratory failure with hypoxemia, recovering ARDS from COVID-19 Continue ATC 247 ad lib. hopefully he will not require any further ventilation He is tolerating cuff down Continue working with PMV, speech therapy. May be ready to consider Po intake soon Routine trach care Consider transition to cuffless #6 or possibly even #4 if he is able to stay off mechanical ventilation.  May be prudent to defer downsizing to #4 until we are sure that his respiratory status remains stable once he is changed from CVVH to intermittent HD.   Acute kidney injury, oliguric, doubt there will be any recovery of renal function CVVHD.  At this point should be able to keep I=O PermCath care per routine Plan to transition to [facility] for IHD once we are sure he will no longer require mechanical ventilation  Need for sedation for mechanical ventilation: Resolved Continue scheduled and as needed pain medicine  Best practice:  Diet: tube feeding, hopeful to avoid PEG Pain/Anxiety/Delirium protocol (if indicated): minimize VAP protocol (if indicated): yes DVT prophylaxis: heparin infusion GI prophylaxis: Stop famotidine 5/22 Glucose control: SSI Mobility: PT working with patient Code Status: full Family Communication: updated via video chat 5/20, [place] updating them today Disposition: ICU.  To [facility] for IHD when no longer requiring MV  Labs   CBC: Recent Labs  Lab 03/22/19 0220 03/23/19 0615 03/24/19 0355 03/25/19 0657 03/26/19 1045  WBC 21.9* 22.6* 21.2* 15.8* 13.1*  HGB 8.2* 8.7* 8.0* 7.8* 7.0*  HCT 28.0* 31.0* 28.2* 27.1* 24.6*  MCV 102.9* 102.3* 103.3* 102.7* 103.8*  PLT 338  425* 408* 387 818    Basic Metabolic Panel: Recent Labs  Lab 03/22/19 0220  03/23/19 0615  03/24/19 0355 03/24/19 1700 03/25/19 0657 03/25/19 0658 03/25/19 1600 03/26/19 1045  NA 135   < > 135   < > 136 137  --  136 136 135  K 4.8   < > 4.8   < > 4.5 4.8  --  5.0 4.7 5.5*  CL 98   < > 97*   < > 99 100  --  100 100 101  CO2 25   < > 26   < > 28 27  --  29 27 27   GLUCOSE 193*   < > 166*   < > 124* 155*  --  122* 83 124*  BUN 36*   < > 37*   < > 41* 35*  --  32* 30* 30*  CREATININE 1.79*   < > 2.04*   < > 2.05* 1.92*  --  1.82* 1.84* 1.83*  CALCIUM 9.6   < > 10.1   < > 10.1 10.3  --  10.2 10.0 10.0  MG 3.1*  --  3.2*  --  3.0*  --  3.0*  --   --  3.0*  PHOS 3.6   < > 2.8   < > 2.9 3.0  --  3.0 3.0 2.9   < > = values in this interval not displayed.   GFR: Estimated Creatinine Clearance: 41.8 mL/min (A) (by C-G formula based on SCr of 1.83 mg/dL (H)). Recent Labs  Lab 03/23/19 0615 03/24/19 0355 03/25/19 0657 03/26/19 1045  WBC 22.6* 21.2* 15.8* 13.1*    Liver Function Tests: Recent Labs  Lab 03/24/19 0355 03/24/19 1700 03/25/19 0658 03/25/19 1600 03/26/19 1045  AST 52*  --   --   --   --   ALT 50*  --   --   --   --   ALKPHOS 177*  --   --   --   --   BILITOT 0.4  --   --   --   --   PROT 9.4*  --   --   --   --   ALBUMIN 3.2*  3.1* 3.1* 3.3* 2.9* 2.8*   No results for input(s): LIPASE, AMYLASE in the last 168 hours. No results for input(s): AMMONIA in the last 168 hours.  ABG    Component Value Date/Time   PHART 7.381 03/18/2019 1112   PCO2ART 42.1 03/18/2019 1112   PO2ART 50.0 (L) 03/18/2019 1112   HCO3 25.1 03/18/2019 1112   TCO2 26 03/18/2019 1112   ACIDBASEDEF 1.0 03/16/2019 0450   O2SAT 85.0 03/18/2019 1112     Coagulation Profile: No results for input(s): INR, PROTIME in the last 168 hours.  Cardiac Enzymes: No results for input(s): CKTOTAL, CKMB, CKMBINDEX, TROPONINI in the last 168 hours.  HbA1C: Hgb A1c MFr Bld  Date/Time Value Ref  Range Status  03/23/2019 06:15 AM 5.5 4.8 - 5.6 % Final    Comment:    (NOTE) Pre diabetes:          5.7%-6.4% Diabetes:              >6.4% Glycemic control for   <7.0% adults with diabetes     CBG: Recent Labs  Lab 03/25/19 1950 03/25/19 2329 03/26/19 0347 03/26/19 0734 03/26/19 1249  GLUCAP 81 123* 96 133* 139*     Critical care time: N/a     Baltazar Apo, MD, PhD 03/26/2019, 1:53 PM [facility] [phone] or if no answer [phone]

## 2019-03-26 NOTE — Progress Notes (Signed)
Spoke with pts daughter [place] via interpreter Janett Billow.  Updated on pt status.  Scheduled video chat for 1400

## 2019-03-26 NOTE — Progress Notes (Addendum)
Occupational Therapy Treatment Patient Details Name: Jonathan [name] MRN: [id number] DOB: [birth year] Today's Date: 03/26/2019    History of present illness 48 y.o. male admitted on 02/23/19 with AMS, O2 sats in the ED were 50%.  Pt dx with COVID 19 and resultant ARDS.  He was intubated 4/21 adn remains intubated.  His course is complicated by acute kidney injury requiring CVVHD.     OT comments  Pt presents supine in bed agreeable to therapy session. Positioned bed in chair position during session with pt engaging in bil UE AROM across multiple planes for continued strengthening/endurance. Pt tolerating chair position approx 10 min, transitioned to sitting EOB with modA (+2 safety and RN present). Pt tolerating sitting EOB for few min with minA for static balance but reports increased fatigue and requesting to return to supine. Max RR briefly up to 42 but overall maintaining in 30s or below during activity, cues provided for slowed breathing with improvements in RR and HR noted (max HR noted 119 during session). BP monitored stable throughout: 131/87 start of session, 132/75 after egress to chair position; 130/82 after approx 10 min in chair position; 124/81 seated EOB (approx 2-3 min);  127/76 end of session supine in bed. Feel POC remains appropriate at this time. Will continue to follow acutely.  [employer] interpreter Jonathan [name] ID [phone] utilized during session.    Follow Up Recommendations  CIR    Equipment Recommendations  3 in 1 bedside commode          Precautions / Restrictions Precautions Precautions: Other (comment) Precaution Comments: R IJ HD catheter, on /trach/T bar Other Brace: Prevalon =- no longer needs Restrictions Weight Bearing Restrictions: No       Mobility Bed Mobility Overal bed mobility: Needs Assistance Bed Mobility: Supine to Sit;Sit to Supine     Supine to sit: Mod assist;+2 for safety/equipment Sit to supine: Max assist;+2 for physical assistance;+2  for safety/equipment   General bed mobility comments: assist for scooting hips and trunk elevation; pt able to move LEs over EOB without assist; +2 assist to return to supine due to pt fatigue  Transfers                 General transfer comment: deferred today due to pt fatigue    Balance Overall balance assessment: Needs assistance Sitting-balance support: Feet unsupported Sitting balance-Leahy Scale: Fair                                     ADL either performed or assessed with clinical judgement   ADL Overall ADL's : Needs assistance/impaired                                             Vision       Perception     Praxis      Cognition Arousal/Alertness: Awake/alert Behavior During Therapy: WFL for tasks assessed/performed                                   General Comments: Appesr WFL .Appropriately following commands adn interacting with therapists  via [place] interpreter        Exercises Exercises: General Upper Extremity;Other exercises General Exercises - Upper Extremity Shoulder  Flexion: AROM;AAROM;Left;10 reps Shoulder Horizontal ABduction: AROM;AAROM;Left;10 reps Elbow Flexion: AROM;AAROM;Both;10 reps Elbow Extension: AROM;AAROM;Both;10 reps Wrist Flexion: AROM;Strengthening;Both;10 reps Wrist Extension: AAROM;PROM;Both;10 reps Digit Composite Flexion: AROM;Both;10 reps Composite Extension: AROM;Both;10 reps Other Exercises Other Exercises: shoulder shrugs x10 - difficulty understanding shoulder rolls backward Other Exercises: PNF diagonals L shoulder   Shoulder Instructions       General Comments      Pertinent Vitals/ Pain       Pain Assessment: 0-10 Pain Score: 5  Pain Location: reports R chest region Pain Descriptors / Indicators: Grimacing;Discomfort;Guarding Pain Intervention(s): Limited activity within patient's tolerance;Monitored during session;Repositioned;RN gave pain meds  during session  Home Living                                          Prior Functioning/Environment              Frequency  Min 3X/week        Progress Toward Goals  OT Goals(current goals can now be found in the care plan section)  Progress towards OT goals: Progressing toward goals  Acute Rehab OT Goals Patient Stated Goal: to get better/stronger OT Goal Formulation: With patient Time For Goal Achievement: 04/05/19 Potential to Achieve Goals: Good  Plan Discharge plan remains appropriate    Co-evaluation                 AM-PAC OT "6 Clicks" Daily Activity     Outcome Measure   Help from another person eating meals?: Total Help from another person taking care of personal grooming?: A Lot Help from another person toileting, which includes using toliet, bedpan, or urinal?: Total Help from another person bathing (including washing, rinsing, drying)?: A Lot Help from another person to put on and taking off regular upper body clothing?: A Lot Help from another person to put on and taking off regular lower body clothing?: A Lot 6 Click Score: 10    End of Session    OT Visit Diagnosis: Other abnormalities of gait and mobility (R26.89);Muscle weakness (generalized) (M62.81);Pain Pain - Right/Left: Right Pain - part of body: Shoulder   Activity Tolerance Patient tolerated treatment well;Patient limited by fatigue   Patient Left in bed;with call bell/phone within reach;with nursing/sitter in room   Nurse Communication Mobility status        Time: 0539-7673 OT Time Calculation (min): 38 min  Charges: OT General Charges $OT Visit: 1 Visit OT Treatments $Therapeutic Activity: 23-37 mins  Lou Cal, [place] Pager [phone] Office [place] 03/26/2019, 1:19 PM

## 2019-03-26 NOTE — Progress Notes (Signed)
Subjective:  Reportedly got OOB yest and also face-timed w/ family for an hour. Sleeping now. Seen in room.   Objective Vital signs in last 24 hours: Vitals:   03/26/19 0745 03/26/19 0800 03/26/19 0845 03/26/19 0900  BP:  117/82  115/74  Pulse: 89 87 85 86  Resp: (!) 24 18 18 14   Temp:  98.4 F (36.9 C)    TempSrc:      SpO2: 100% 100% 100% 100%  Weight:      Height:       Weight change: 0.1 kg  Intake/Output Summary (Last 24 hours) at 03/26/2019 6301 Last data filed at 03/26/2019 0900 Gross per 24 hour  Intake 1568.39 ml  Output 442 ml  Net 1126.39 ml   Physical Exam: Trach, NG tube , R chest HD cath Pt sleeping, did not awaken  Chest coarse bilat, coughing Cor reg  Abd soft ntnd no ascites  Ext no edema LE's or UE's   CXR 5/17 diffuse stable infiltrates CXR 5/21 no chg stable infiltrates  Assessment/ Plan: Pt is a 48 y.o. yo male who was admitted on 02/23/2019 with anuric AKI due to ATN in the setting of COVID   Assessment/Plan: 1. AKI-  On CRRT since 4/21. Now off pressors w/ good BP's.  NO UOP so no signs of renal recovery yet and still requiring RRT. SP TDC. If off vent and not requiring ICU care could do intermittent HD at [facility].  Was looking dry so have been keeping net + now.  - I/O +1.2 L yest, wt's unchanged at 60kg > increase UF net +  to 100 cc/ hr to ^intravasc volume some. At +50/hr we are only replacing insensibles and wt's are not going up but are not going down either which is good - PTT up today at 111 sec, will decrease CRRT hep 1000 > 500u/ hr and recheck tomorrow am  2. HTN/vol- down 35-40 lbs from admit - keep + w/ CRRT for now  3. Anemia- hgb low- supportive care for now- no iron in the setting of sepsis. Will start esa darbe 100 ug /wk.  Cont to transfuse as needed  4. ID-  covid positive. SP IV vanc / zosyn   5. VDRF- on vent/ trach, 40% FiO2, stable  6.  [place]- K is fine on all 4 k bath- no change , got phos 5/13-and 5/15  - calcium OK as  well   [facility] 03/26/2019, 9:26 AM    Labs: Basic Metabolic Panel: Recent Labs  Lab 03/24/19 1700 03/25/19 0658 03/25/19 1600  NA 137 136 136  K 4.8 5.0 4.7  CL 100 100 100  CO2 27 29 27   GLUCOSE 155* 122* 83  BUN 35* 32* 30*  CREATININE 1.92* 1.82* 1.84*  CALCIUM 10.3 10.2 10.0  PHOS 3.0 3.0 3.0   Liver Function Tests: Recent Labs  Lab 03/24/19 0355 03/24/19 1700 03/25/19 0658 03/25/19 1600  AST 52*  --   --   --   ALT 50*  --   --   --   ALKPHOS 177*  --   --   --   BILITOT 0.4  --   --   --   PROT 9.4*  --   --   --   ALBUMIN 3.2*  3.1* 3.1* 3.3* 2.9*   No results for input(s): LIPASE, AMYLASE in the last 168 hours. No results for input(s): AMMONIA in the last 168 hours. CBC: Recent Labs  Lab 03/21/19 0640 03/22/19 0220 03/23/19 0615 03/24/19 0355 03/25/19 0657  WBC 22.6* 21.9* 22.6* 21.2* 15.8*  HGB 7.9* 8.2* 8.7* 8.0* 7.8*  HCT 27.4* 28.0* 31.0* 28.2* 27.1*  MCV 102.2* 102.9* 102.3* 103.3* 102.7*  PLT 308 338 425* 408* 387   Cardiac Enzymes: No results for input(s): CKTOTAL, CKMB, CKMBINDEX, TROPONINI in the last 168 hours. CBG: Recent Labs  Lab 03/25/19 1556 03/25/19 1950 03/25/19 2329 03/26/19 0347 03/26/19 0734  GLUCAP 135* 81 123* 96 133*    Iron Studies: No results for input(s): IRON, TIBC, TRANSFERRIN, FERRITIN in the last 72 hours. Studies/Results: Dg Chest Port 1 View  Result Date: 03/25/2019 CLINICAL DATA:  Follow-up pneumonia, positive COVID-19 EXAM: PORTABLE CHEST 1 VIEW COMPARISON:  03/21/2019 FINDINGS: Cardiac shadows within normal limits. Tracheostomy tube, gastric catheter and dialysis catheter are noted in satisfactory position. Diffuse airspace opacities are noted throughout both lungs stable from the prior exam. No bony abnormality is noted. IMPRESSION: No change in bilateral infiltrates. Electronically Signed   By: Inez Catalina M.D.   On: 03/25/2019 07:45   Dg Abd Portable 1v  Result Date:  03/26/2019 CLINICAL DATA:  Nasogastric tube placement EXAM: PORTABLE ABDOMEN - 1 VIEW COMPARISON:  03/19/2019 FINDINGS: Nasogastric tube is looped in the proximal stomach. Dialysis catheter with tip at the lower right atrium. Normal bowel gas pattern. IMPRESSION: 1. Nasogastric tube tip is in the stomach. 2. Dialysis catheter tip is near the inferior cavoatrial junction. Electronically Signed   By: Monte Fantasia M.D.   On: 03/26/2019 04:14   Medications: Infusions: .  prismasol BGK 4/2.5 300 mL/hr at 03/25/19 1810  .  prismasol BGK 4/2.5 500 mL/hr at 03/26/19 0420  . sodium chloride    . sodium chloride Stopped (03/21/19 2128)  . feeding supplement (VITAL 1.5 CAL) 45 mL/hr at 03/26/19 0800  . heparin 10,000 units/ 20 mL infusion syringe 500 Units/hr (03/26/19 0900)  . heparin 500 Units/hr (03/26/19 0900)  . prismasol BGK 4/2.5 1,500 mL/hr at 03/26/19 0744    Scheduled Medications: . chlorhexidine  15 mL Mouth/Throat BID  . Chlorhexidine Gluconate Cloth  6 each Topical Daily  . famotidine  20 mg Per Tube Daily  . feeding supplement (PRO-STAT SUGAR FREE 64)  60 mL Per Tube BID  . free water  100 mL Per Tube Q8H  . insulin aspart  0-15 Units Subcutaneous Q4H  . insulin aspart  6 Units Subcutaneous Q4H  . insulin glargine  15 Units Subcutaneous BID  . mouth rinse  15 mL Mouth Rinse 10 times per day  . oxyCODONE  5 mg Oral Q8H  . sodium chloride flush  10-40 mL Intracatheter Q12H    have reviewed scheduled and prn medications.

## 2019-03-26 NOTE — Progress Notes (Signed)
[place] for Heparin Indication: Suspected PE  Not on File  Patient Measurements: Height: 5\' 5"  (165.1 cm) Weight: 132 lb 0.9 oz (59.9 kg) IBW/kg (Calculated) : 61.5 Heparin Dosing Weight: 66 kg  Vital Signs: Temp: 99 F (37.2 C) (05/22 0357) Temp Source: Oral (05/22 0357) BP: 106/77 (05/22 0700) Pulse Rate: 88 (05/22 0730)  Labs: Recent Labs    03/24/19 0355 03/24/19 0356 03/24/19 1700 03/25/19 0657 03/25/19 0658 03/25/19 1600 03/25/19 1700  HGB 8.0*  --   --  7.8*  --   --   --   HCT 28.2*  --   --  27.1*  --   --   --   PLT 408*  --   --  387  --   --   --   APTT 84*  --   --  111*  --   --   --   HEPARINUNFRC  --  0.55  --   --  0.72*  --  0.71*  CREATININE 2.05*  --  1.92*  --  1.82* 1.84*  --     Estimated Creatinine Clearance: 41.6 mL/min (A) (by C-G formula based on SCr of 1.84 mg/dL (H)).  Assessment: 41 yom with complications from ERDEY-81 including acute respiratory failure s/p tracheostomy and ATN on CRRT now s/p permcath. CCM transitioned to heparin drip 2/2 suspected PE.   Today, 03/26/19   HL 0.33, decreased to therapeutic level (large decrease after relatively small dosing change)  APTT 72  Concurrent heparin via CRRT circuit was decreased from 1000 units/hr to 500 units/hr on 5/22 AM per Dr. Jonnie Finner, then d/c.  Hgb low but stable, plt WNL  No bleeding or line issues noted  per RN   Goal of Therapy:  Heparin level 0.3-0.7 units/ml Monitor platelets by anticoagulation protocol: Yes   Plan:   Continue heparin IV infusion at 500 units/hr  Heparin level in 8 hours to confirm.  Daily heparin level and CBC  Continue to monitor H&H and platelets, bleeding.  Follow up long-term anticoagulation plans.   Gretta Arab PharmD, BCPS 03/26/2019 7:37 AM

## 2019-03-26 NOTE — Progress Notes (Signed)
Assisted tele visit to patient with family member via email address [email or web address].  Jaimon Bugaj, Janace Hoard, RN

## 2019-03-26 NOTE — Progress Notes (Signed)
PROGRESS NOTE  Kathy Wares YTK:[id number] DOB: February 25, 1971 DOA: 02/23/2019  PCP: Patient, No Pcp Per  Brief History/Interval Summary: 48 y.o. male [place] speaking, arrived from [place] on 02/13/2019 and was living in close quarters with 6 other workers. He developed increasing rrespiratory distress and was intubated for ARDS due to COVID-19 infection. He was started on CRRT due to ATN, paralyzed and prone. He was transferred to [place] for further management. He was eventually extubated, but quickly became short of breath again and aspirated when he started vomiting. He was re intubated and ended up with tracheostomy.   Events: 02/23/2019 admitted to the ICU. 02/24/2019 started on CRRT 4/21-4/24 Proned daily 4/28 -4/30 proned daily  03/04/2019 increase in oxygen demand require paralytic for 24 hours. 03/09/2019 hematochezia from gastric aspirate  Lines and tubes: 4/21 - ETT >>5.11.2020 4/21 - RIJ CVC 4/22 L IJ HD Cath> 5/13 Permacath right  Antibiotics: 02/23/2019-425 2020 azithromycin and Rocephin 02/23/2019-02/24/2019 03/06/2019 Rocephin 5/11-5/17 Vancomycin 5/12-5/18 Zosyn 5/12-5/17 Fluconazole for candida in tracheal aspirate and recent Actemra  Consultants: Pulmonology.  Nephrology.   Subjective/Interval History: Patient awake alert.  Nurse reports of frothy pinkish secretions from his tracheostomy.     Assessment/Plan:  Acute Hypoxic Resp. Failure due to Acute Covid 19 Viral Illness/ARDS  Fever: No recent fever Oxygen requirements: Tracheostomy status.  Was on trach collar for last 24 hours.  35% FiO2.  Saturating in the 90s. Antibiotics: Not on any antibiotics currently Steroids: Not on steroids at this time Diuretics: Not on any diuretics Actemra: Received on 4/22 Convalescent Plasma: On 5/3 Vitamin C and Zinc: None  Patient has been stable from a respiratory standpoint.  He tolerated being on trach collar the last 24 hours.  Some pinkish secretions  noted from his tracheostomy.  He does have coarse breath sounds with few crackles.  Concern about him being in positive balance.  Discussed with Dr. Jonnie Finner with nephrology.  He feels that the patient is dehydrated but agreeable to keep him even balance for the next 24 hours.  Pulmonology continues to follow. He has completed course of antibiotics and steroids.   PT and OT following.  Could be a candidate for CIR. Cuff was taken down yesterday.  Speech therapy is following.  Presumed acute PE Patient had an acute episode of hypoxia with respiratory decompensation.  Echocardiogram showed right-sided strain.  Patient was empiricaly placed on IV heparin due to concern for underlying pulmonary embolism.  Lower extremity Doppler studies negative.  3 months of anticoagulation recommended.  Plan was to transition him to Eliquis.  However in view of dropping hemoglobin we will hold off on this for another 24 hours.  Septic shock Patient has been weaned off of pressors.  Acute kidney injury due to ATN Patient remains on CRRT.  Nephrology is following and managing.  No signs of renal recovery.  Once patient is off of ventilator and out of the ICU he will need intermittent dialysis for which he will need to be transferred to [facility].   Anemia due to chronic illness and possible acute GI blood loss No GI blood loss has been noted.  Some pinkish secretions from tracheostomy.  Hemoglobin noted to be 7.0 today.  We will recheck it tomorrow.  He may need blood transfusion if he drops below 7  Leukocytosis Patient remains afebrile.  WBC is improving.  Stool studies were negative for C. difficile a few days ago.  Continue to monitor.     Diabetes  mellitus type 2 Patient on basal insulin as well as sliding scale coverage.  HbA1c 5.5.  Low CBG values noted.  We will cut back on his basal insulin.    FEN On CRRT.  Monitor electrolytes.  Labs are pending from this morning.  Continue tube feedings.     Pressure injury on the skin of nose Local wound care.   DVT Prophylaxis: On heparin infusion  Lab Results  Component Value Date   PLT 346 03/26/2019     PUD Prophylaxis: On famotidine Code Status: Partial code Family Communication:  Wife Energy manager number [phone].  Discussed with his daughter on a daily basis.   Disposition Plan: Patient will remain in ICU for now   Medications:  Scheduled: . chlorhexidine  15 mL Mouth/Throat BID  . Chlorhexidine Gluconate Cloth  6 each Topical Daily  . famotidine  20 mg Per Tube Daily  . feeding supplement (PRO-STAT SUGAR FREE 64)  60 mL Per Tube BID  . free water  100 mL Per Tube Q8H  . insulin aspart  0-15 Units Subcutaneous Q4H  . insulin aspart  6 Units Subcutaneous Q4H  . insulin glargine  15 Units Subcutaneous BID  . mouth rinse  15 mL Mouth Rinse 10 times per day  . oxyCODONE  5 mg Oral Q8H  . sodium chloride flush  10-40 mL Intracatheter Q12H   Continuous: .  prismasol BGK 4/2.5 300 mL/hr at 03/26/19 1100  .  prismasol BGK 4/2.5 500 mL/hr at 03/26/19 0420  . sodium chloride    . sodium chloride Stopped (03/21/19 2128)  . feeding supplement (VITAL 1.5 CAL) 45 mL/hr at 03/26/19 0800  . heparin 500 Units/hr (03/26/19 1100)  . prismasol BGK 4/2.5 1,500 mL/hr at 03/26/19 1112   STM:HDQQI/WLNLGXQJ arterial line **AND** sodium chloride, sodium chloride, acetaminophen (TYLENOL) oral liquid 160 mg/5 mL, glycopyrrolate, haloperidol lactate, heparin, heparin, HYDROmorphone (DILAUDID) injection, metoprolol tartrate, midazolam, ondansetron (ZOFRAN) IV, oxyCODONE, sodium chloride flush   Objective:  Vital Signs  Vitals:   03/26/19 1015 03/26/19 1030 03/26/19 1100 03/26/19 1115  BP:   117/82   Pulse: 94 94 89 99  Resp: (!) 30 (!) 28 20 (!) 24  Temp:      TempSrc:      SpO2: 100% 100% 100% 96%  Weight:      Height:        Intake/Output Summary (Last 24 hours) at 03/26/2019 1209 Last data filed at  03/26/2019 1100 Gross per 24 hour  Intake 1669.39 ml  Output 328 ml  Net 1341.39 ml   Filed Weights   03/24/19 0500 03/25/19 0700 03/26/19 0500  Weight: 58.8 kg 59.8 kg 59.9 kg   General appearance: Awake alert.  In no distress Tracheostomy noted Resp: Mildly tachypneic.  Coarse breath sounds with a few crackles at the bases.  No wheezing.   Cardio: S1-S2 is normal regular.  No S3-S4.  No rubs murmurs or bruit GI: Abdomen is soft.  Nontender nondistended.  Bowel sounds are present normal.  No masses organomegaly Extremities: No edema.  Full range of motion of lower extremities. Neurologic: No focal neurological deficits.    Lab Results:  Data Reviewed: I have personally reviewed following labs and imaging studies  CBC: Recent Labs  Lab 03/22/19 0220 03/23/19 0615 03/24/19 0355 03/25/19 0657 03/26/19 1045  WBC 21.9* 22.6* 21.2* 15.8* 13.1*  HGB 8.2* 8.7* 8.0* 7.8* 7.0*  HCT 28.0* 31.0* 28.2* 27.1* 24.6*  MCV 102.9* 102.3* 103.3* 102.7* 103.8*  PLT 338 425* 408* 387 341    Basic Metabolic Panel: Recent Labs  Lab 03/22/19 0220  03/23/19 0615 03/23/19 1625 03/24/19 0355 03/24/19 1700 03/25/19 0657 03/25/19 0658 03/25/19 1600 03/26/19 1045  NA 135   < > 135 136 136 137  --  136 136  --   K 4.8   < > 4.8 4.6 4.5 4.8  --  5.0 4.7  --   CL 98   < > 97* 99 99 100  --  100 100  --   CO2 25   < > 26 27 28 27   --  29 27  --   GLUCOSE 193*   < > 166* 148* 124* 155*  --  122* 83  --   BUN 36*   < > 37* 37* 41* 35*  --  32* 30*  --   CREATININE 1.79*   < > 2.04* 1.96* 2.05* 1.92*  --  1.82* 1.84*  --   CALCIUM 9.6   < > 10.1 10.2 10.1 10.3  --  10.2 10.0  --   MG 3.1*  --  3.2*  --  3.0*  --  3.0*  --   --  3.0*  PHOS 3.6   < > 2.8 2.5 2.9 3.0  --  3.0 3.0  --    < > = values in this interval not displayed.    GFR: Estimated Creatinine Clearance: 41.6 mL/min (A) (by C-G formula based on SCr of 1.84 mg/dL (H)).  Liver Function Tests: Recent Labs  Lab 03/23/19 1625  03/24/19 0355 03/24/19 1700 03/25/19 0658 03/25/19 1600  AST  --  52*  --   --   --   ALT  --  50*  --   --   --   ALKPHOS  --  177*  --   --   --   BILITOT  --  0.4  --   --   --   PROT  --  9.4*  --   --   --   ALBUMIN 3.3* 3.2*  3.1* 3.1* 3.3* 2.9*    CBG: Recent Labs  Lab 03/25/19 1556 03/25/19 1950 03/25/19 2329 03/26/19 0347 03/26/19 0734  GLUCAP 135* 81 123* 96 133*     Recent Results (from the past 240 hour(s))  C difficile quick scan w PCR reflex     Status: None   Collection Time: 03/22/19  3:26 PM  Result Value Ref Range Status   C Diff antigen NEGATIVE NEGATIVE Final   C Diff toxin NEGATIVE NEGATIVE Final   C Diff interpretation No C. difficile detected.  Final    Comment: Performed at [facility], [place] [address] [place], [place] [address]      Radiology Studies: Dg Chest Port 1 View  Result Date: 03/25/2019 CLINICAL DATA:  Follow-up pneumonia, positive COVID-19 EXAM: PORTABLE CHEST 1 VIEW COMPARISON:  03/21/2019 FINDINGS: Cardiac shadows within normal limits. Tracheostomy tube, gastric catheter and dialysis catheter are noted in satisfactory position. Diffuse airspace opacities are noted throughout both lungs stable from the prior exam. No bony abnormality is noted. IMPRESSION: No change in bilateral infiltrates. Electronically Signed   By: Inez Catalina M.D.   On: 03/25/2019 07:45   Dg Abd Portable 1v  Result Date: 03/26/2019 CLINICAL DATA:  Nasogastric tube placement EXAM: PORTABLE ABDOMEN - 1 VIEW COMPARISON:  03/19/2019 FINDINGS: Nasogastric tube is looped in the proximal stomach. Dialysis catheter with tip at the lower right atrium.  Normal bowel gas pattern. IMPRESSION: 1. Nasogastric tube tip is in the stomach. 2. Dialysis catheter tip is near the inferior cavoatrial junction. Electronically Signed   By: Monte Fantasia M.D.   On: 03/26/2019 04:14       LOS: 31 days   Eliazar Olivar [employer] on  [email or web address]  03/26/2019, 12:09 PM

## 2019-03-26 NOTE — Progress Notes (Signed)
Verbal order per Dr. Jonnie Finner to decrease CRRT circuit heparin to 500 units/hr. Infusion rate on CRRT now running 1 ml/hr.

## 2019-03-26 NOTE — Progress Notes (Signed)
SLP Cancellation Note  Patient Details Name: Jonathan [name] MRN: [id number] DOB: 04/22/1971   Cancelled treatment:       Reason Eval/Treat Not Completed: Medical issues which prohibited therapy. Connected to [place] and disconnected tbar, pt desatted into the 60s. tbar reconnected. RN suctioned pt, Sats returned to 96. Deferred any PMSV trials. RN reports this happened again a few minutes later when tubing changed out. Will attempt again tomorrow. Green PMSV that can fit in Tbar tubing may help. Will try next session.   Jonathan Baltimore, MA CCC-SLP  Acute Rehabilitation Services Pager [phone] Office [phone]  Jonathan [name] 03/26/2019, 5:28 PM

## 2019-03-26 NOTE — Progress Notes (Signed)
ANTICOAGULATION  CONSULT NOTE   Pharmacy Consult for Heparin Indication: Suspected PE  Not on File  Patient Measurements: Height: 5\' 5"  (165.1 cm) Weight: 132 lb 0.9 oz (59.9 kg) IBW/kg (Calculated) : 61.5 Heparin Dosing Weight: 66 kg  Vital Signs: Temp: 97.8 F (36.6 C) (05/22 1948) Temp Source: Oral (05/22 1600) BP: 111/77 (05/22 2200) Pulse Rate: 97 (05/22 2200)  Labs: Recent Labs    03/24/19 0355  03/25/19 0657  03/25/19 1600 03/25/19 1700 03/26/19 1045 03/26/19 1600 03/26/19 2049  HGB 8.0*  --  7.8*  --   --   --  7.0*  --   --   HCT 28.2*  --  27.1*  --   --   --  24.6*  --   --   PLT 408*  --  387  --   --   --  346  --   --   APTT 84*  --  111*  --   --   --  72*  --   --   HEPARINUNFRC  --    < >  --    < >  --  0.71* 0.33  --  0.12*  CREATININE 2.05*   < >  --    < > 1.84*  --  1.83* 1.95*  --    < > = values in this interval not displayed.    Estimated Creatinine Clearance: 39.3 mL/min (A) (by C-G formula based on SCr of 1.95 mg/dL (H)).  Assessment: 53 yom with complications from DPOEU-23 including acute respiratory failure s/p tracheostomy and ATN on CRRT now s/p permcath. CCM transitioned to heparin drip 2/2 suspected PE.   Today, 03/26/19  HL decreased to 0.12 Hgb low but stable, plt WNL No bleeding or line issues noted  per RN   Goal of Therapy:  Heparin level 0.3-0.7 units/ml Monitor platelets by anticoagulation protocol: Yes   Plan:   Increase heparin IV infusion to 550 units/hr  Check heparin level in 8 hrs  Monitor daily heparin level, CBC, s/s of bleed  Continue to monitor H&H and platelets, bleeding.  Follow up long-term anticoagulation plans.  Elenor Quinones, PharmD, BCPS, BCIDP Clinical Pharmacist 03/26/2019 10:33 PM

## 2019-03-27 ENCOUNTER — Inpatient Hospital Stay (HOSPITAL_COMMUNITY): Payer: HRSA Program

## 2019-03-27 LAB — RENAL FUNCTION PANEL
Albumin: 3.1 g/dL — ABNORMAL LOW (ref 3.5–5.0)
Anion gap: 7 (ref 5–15)
BUN: 32 mg/dL — ABNORMAL HIGH (ref 6–20)
CO2: 29 mmol/L (ref 22–32)
Calcium: 10.3 mg/dL (ref 8.9–10.3)
Chloride: 98 mmol/L (ref 98–111)
Creatinine, Ser: 1.94 mg/dL — ABNORMAL HIGH (ref 0.61–1.24)
GFR calc Af Amer: 46 mL/min — ABNORMAL LOW (ref >60)
GFR calc non Af Amer: 40 mL/min — ABNORMAL LOW (ref >60)
Glucose, Bld: 149 mg/dL — ABNORMAL HIGH (ref 70–99)
Phosphorus: 3 mg/dL (ref 2.5–4.6)
Potassium: 5 mmol/L (ref 3.5–5.1)
Sodium: 134 mmol/L — ABNORMAL LOW (ref 135–145)

## 2019-03-27 LAB — CBC
HCT: 24.5 % — ABNORMAL LOW (ref 39.0–52.0)
Hemoglobin: 7.3 g/dL — ABNORMAL LOW (ref 13.0–17.0)
MCH: 30.4 pg (ref 26.0–34.0)
MCHC: 29.8 g/dL — ABNORMAL LOW (ref 30.0–36.0)
MCV: 102.1 fL — ABNORMAL HIGH (ref 80.0–100.0)
Platelets: 345 10*3/uL (ref 150–400)
RBC: 2.4 MIL/uL — ABNORMAL LOW (ref 4.22–5.81)
RDW: 16.9 % — ABNORMAL HIGH (ref 11.5–15.5)
WBC: 16.2 10*3/uL — ABNORMAL HIGH (ref 4.0–10.5)
nRBC: 0 % (ref 0.0–0.2)

## 2019-03-27 LAB — GLUCOSE, CAPILLARY
Glucose-Capillary: 140 mg/dL — ABNORMAL HIGH (ref 70–99)
Glucose-Capillary: 145 mg/dL — ABNORMAL HIGH (ref 70–99)
Glucose-Capillary: 145 mg/dL — ABNORMAL HIGH (ref 70–99)
Glucose-Capillary: 126 mg/dL — ABNORMAL HIGH (ref 70–99)
Glucose-Capillary: 122 mg/dL — ABNORMAL HIGH (ref 70–99)

## 2019-03-27 LAB — HEPARIN LEVEL (UNFRACTIONATED): Heparin Unfractionated: 0.18 IU/mL — ABNORMAL LOW (ref 0.30–0.70)

## 2019-03-27 LAB — APTT: aPTT: 57 seconds — ABNORMAL HIGH (ref 24–36)

## 2019-03-27 LAB — MAGNESIUM: Magnesium: 3.1 mg/dL — ABNORMAL HIGH (ref 1.7–2.4)

## 2019-03-27 MED ORDER — DARBEPOETIN ALFA 100 MCG/0.5ML IJ SOSY
100.0000 ug | PREFILLED_SYRINGE | INTRAMUSCULAR | Status: DC
  Administered 2019-03-27: 18:00:00 100 ug via SUBCUTANEOUS
  Filled 2019-03-27 (×3): qty 0.5

## 2019-03-27 MED ORDER — SODIUM ZIRCONIUM CYCLOSILICATE 10 G PO PACK
10.0000 g | PACK | Freq: Every day | ORAL | Status: DC
  Administered 2019-03-27 – 2019-03-28 (×2): 10 g via ORAL
  Filled 2019-03-27 (×2): qty 1

## 2019-03-27 MED ORDER — OXYCODONE HCL 5 MG PO TABS
5.0000 mg | ORAL_TABLET | Freq: Two times a day (BID) | ORAL | Status: DC
  Administered 2019-03-27 – 2019-03-31 (×9): 5 mg via ORAL
  Filled 2019-03-27 (×9): qty 1

## 2019-03-27 MED ORDER — HEPARIN SODIUM (PORCINE) 1000 UNIT/ML DIALYSIS
1000.0000 [IU] | INTRAMUSCULAR | Status: DC | PRN
  Administered 2019-03-30: 3400 [IU] via INTRAVENOUS_CENTRAL
  Filled 2019-03-27 (×2): qty 6

## 2019-03-27 MED ORDER — APIXABAN 5 MG PO TABS
5.0000 mg | ORAL_TABLET | Freq: Two times a day (BID) | ORAL | Status: DC
  Administered 2019-03-27: 5 mg via ORAL
  Filled 2019-03-27 (×2): qty 1

## 2019-03-27 MED ORDER — ORAL CARE MOUTH RINSE
15.0000 mL | Freq: Two times a day (BID) | OROMUCOSAL | Status: DC
  Administered 2019-03-28 – 2019-04-19 (×41): 15 mL via OROMUCOSAL

## 2019-03-27 MED ORDER — CHLORHEXIDINE GLUCONATE 0.12 % MT SOLN
15.0000 mL | Freq: Two times a day (BID) | OROMUCOSAL | Status: DC
  Administered 2019-03-27 – 2019-04-19 (×45): 15 mL via OROMUCOSAL
  Filled 2019-03-27 (×39): qty 15

## 2019-03-27 MED ORDER — HEPARIN (PORCINE) 25000 UT/250ML-% IV SOLN: 600.0000 [IU]/h | INTRAVENOUS | Status: DC

## 2019-03-27 MED ORDER — CLONAZEPAM 0.1 MG/ML ORAL SUSPENSION: 0.2500 mg | Freq: Every day | ORAL | Status: DC

## 2019-03-27 MED ORDER — SODIUM CHLORIDE 0.9 % IV SOLN
1000.0000 [IU]/h | INTRAVENOUS | Status: DC
  Administered 2019-03-27: 1000 [IU]/h via INTRAVENOUS_CENTRAL
  Filled 2019-03-27: qty 2

## 2019-03-27 MED ORDER — CLONAZEPAM 0.5 MG PO TABS
0.2500 mg | ORAL_TABLET | Freq: Every day | ORAL | Status: DC
  Administered 2019-03-27 – 2019-03-30 (×4): 0.25 mg
  Filled 2019-03-27 (×7): qty 1

## 2019-03-27 NOTE — Progress Notes (Signed)
PROGRESS NOTE  Jonathan [name] ZOX:[id number] DOB: [birth year] DOA: 02/23/2019  PCP: Patient, No Pcp Per  Brief History/Interval Summary: 48 y.o. male [place] speaking, arrived from [place] on 02/13/2019 and was living in close quarters with 6 other workers. He developed increasing rrespiratory distress and was intubated for ARDS due to COVID-19 infection. He was started on CRRT due to ATN, paralyzed and prone. He was transferred to [place] for further management. He was eventually extubated, but quickly became short of breath again and aspirated when he started vomiting. He was re intubated and ended up with tracheostomy.   Events: 02/23/2019 admitted to the ICU. 02/24/2019 started on CRRT 4/21-4/24 Proned daily 4/28 -4/30 proned daily  03/04/2019 increase in oxygen demand require paralytic for 24 hours. 03/09/2019 hematochezia from gastric aspirate  Lines and tubes: 4/21 - ETT >>5.11.2020 4/21 - RIJ CVC 4/22 L IJ HD Cath> 5/13 Permacath right  Antibiotics: 02/23/2019-425 2020 azithromycin and Rocephin 02/23/2019-02/24/2019 03/06/2019 Rocephin 5/11-5/17 Vancomycin 5/12-5/18 Zosyn 5/12-5/17 Fluconazole for candida in tracheal aspirate and recent Actemra  Subjective/Interval History: Patient awake alert.  No significant events overnight per staff   Assessment/Plan:  Acute Hypoxic Resp. Failure due to Acute Covid 19 Viral Illness/ARDS  Fever: No recent fever Oxygen requirements: Tracheostomy status.  Was on trach collar for last 24 hours.  35% FiO2.  Saturating in the 90s. Antibiotics: Not on any antibiotics currently Steroids: Not on steroids at this time Diuretics: Not on any diuretics Actemra: Received on 4/22 Convalescent Plasma: On 5/3 Vitamin C and Zinc: None  Patient has been stable from a respiratory standpoint.  Is been tolerating his trach collar over last 48 hours, there is some concern about positive balance, this has been managed with CRRT, will monitor  today without CRRT, to see if he can tolerate intermittent dialysis .  Pulmonology continues to follow. He has completed course of antibiotics and steroids.   PT and OT following.  Could be a candidate for CIR.  Speech therapy is following.  Presumed acute PE Patient had an acute episode of hypoxia with respiratory decompensation.  Echocardiogram showed right-sided strain.  Patient was empiricaly placed on IV heparin due to concern for underlying pulmonary embolism.  Lower extremity Doppler studies negative.  3 months of anticoagulation recommended.  Will be transition to Eliquis today .  Septic shock Patient has been weaned off of pressors.  Acute kidney injury due to ATN Patient remains on CRRT.  Nephrology is following and managing.  No signs of renal recovery.  Discussed with renal today, will continue with CRRT intermittently to see if he will tolerate intermittent hemodialysis, will hold his CRRT for next 1 or 2 days to see if he is able to tolerate, and if you well, that means he will be able to tolerate hemodialysis.  Anemia due to chronic illness and possible acute GI blood loss No GI blood loss has been noted.  Patient has been having progressive anemia, his hemoglobin has been in the 7-8 range over the last couple weeks, there is no evidence of active GI bleed, will monitor H&H closely and transfuse as needed, I will obtain type and screen and transfuse 1 unit if hemoglobin less than 7.  Leukocytosis Patient remains afebrile.  WBC is improving.  Stool studies were negative for C. difficile a few days ago.  Continue to monitor.     Diabetes mellitus type 2 Patient on basal insulin as well as sliding scale coverage.  HbA1c 5.5.  Low CBG  values noted.  We will cut back on his basal insulin.    FEN On CRRT.  Monitor electrolytes.  Labs are pending from this morning.  Continue tube feedings.    Pressure injury on the skin of nose Local wound care.   DVT Prophylaxis: On heparin  infusion  Lab Results  Component Value Date   PLT 345 03/27/2019     PUD Prophylaxis: On famotidine Code Status: Partial code Family Communication:  Wife Energy manager number [phone].  Will call family today Disposition Plan: Patient will remain in ICU for now   Consultants: Pulmonology.  Nephrology.     Medications:  Scheduled: . apixaban  5 mg Oral BID  . chlorhexidine  15 mL Mouth/Throat BID  . Chlorhexidine Gluconate Cloth  6 each Topical Daily  . clonazePAM  0.25 mg Per Tube Daily  . darbepoetin (ARANESP) injection - NON-DIALYSIS  100 mcg Subcutaneous Q Sat-1800  . famotidine  20 mg Per Tube Daily  . feeding supplement (PRO-STAT SUGAR FREE 64)  60 mL Per Tube BID  . free water  100 mL Per Tube Q8H  . insulin aspart  0-15 Units Subcutaneous Q4H  . insulin aspart  6 Units Subcutaneous Q4H  . insulin glargine  10 Units Subcutaneous BID  . mouth rinse  15 mL Mouth Rinse 10 times per day  . oxyCODONE  5 mg Oral Q12H  . sodium chloride flush  10-40 mL Intracatheter Q12H  . sodium zirconium cyclosilicate  10 g Oral Daily   Continuous: . sodium chloride    . sodium chloride Stopped (03/21/19 2128)  . feeding supplement (VITAL 1.5 CAL) 1,000 mL (03/27/19 1239)   VOZ:DGUYQ/IHKVQQVZ arterial line **AND** sodium chloride, sodium chloride, acetaminophen (TYLENOL) oral liquid 160 mg/5 mL, glycopyrrolate, haloperidol lactate, heparin, HYDROmorphone (DILAUDID) injection, metoprolol tartrate, midazolam, ondansetron (ZOFRAN) IV, oxyCODONE, sodium chloride flush   Objective:  Vital Signs  Vitals:   03/27/19 1130 03/27/19 1145 03/27/19 1200 03/27/19 1215  BP:   113/72   Pulse: (!) 128 (!) 127 (!) 128 (!) 123  Resp: (!) 47 (!) 47 (!) 58 (!) 28  Temp:    100.1 F (37.8 C)  TempSrc:    Axillary  SpO2: (!) 88% (!) 89% (!) 86% 100%  Weight:      Height:        Intake/Output Summary (Last 24 hours) at 03/27/2019 1308 Last data filed at 03/27/2019  1200 Gross per 24 hour  Intake 1761.22 ml  Output 1786 ml  Net -24.78 ml   Filed Weights   03/25/19 0700 03/26/19 0500 03/27/19 0500  Weight: 59.8 kg 59.9 kg 61.4 kg   Awake Alert, anxious Tracheostomy noted Symmetrical Chest wall movement, Good air movement bilaterally, basilar crackles RRR,No Gallops,Rubs or new Murmurs, No Parasternal Heave +ve B.Sounds, Abd Soft, No tenderness, No rebound - guarding or rigidity. No Cyanosis, Clubbing or edema, No new Rash or bruise     Lab Results:  Data Reviewed: I have personally reviewed following labs and imaging studies  CBC: Recent Labs  Lab 03/23/19 0615 03/24/19 0355 03/25/19 0657 03/26/19 1045 03/27/19 0715  WBC 22.6* 21.2* 15.8* 13.1* 16.2*  HGB 8.7* 8.0* 7.8* 7.0* 7.3*  HCT 31.0* 28.2* 27.1* 24.6* 24.5*  MCV 102.3* 103.3* 102.7* 103.8* 102.1*  PLT 425* 408* 387 346 563    Basic Metabolic Panel: Recent Labs  Lab 03/23/19 0615  03/24/19 0355  03/25/19 0657 03/25/19 0658 03/25/19 1600 03/26/19 1045 03/26/19 1600 03/27/19 0715  NA 135   < >  136   < >  --  136 136 135 135 134*  K 4.8   < > 4.5   < >  --  5.0 4.7 5.5* 5.3* 5.0  CL 97*   < > 99   < >  --  100 100 101 100 98  CO2 26   < > 28   < >  --  29 27 27 28 29   GLUCOSE 166*   < > 124*   < >  --  122* 83 124* 99 149*  BUN 37*   < > 41*   < >  --  32* 30* 30* 35* 32*  CREATININE 2.04*   < > 2.05*   < >  --  1.82* 1.84* 1.83* 1.95* 1.94*  CALCIUM 10.1   < > 10.1   < >  --  10.2 10.0 10.0 10.0 10.3  MG 3.2*  --  3.0*  --  3.0*  --   --  3.0*  --  3.1*  PHOS 2.8   < > 2.9   < >  --  3.0 3.0 2.9 3.0 3.0   < > = values in this interval not displayed.    GFR: Estimated Creatinine Clearance: 40.4 mL/min (A) (by C-G formula based on SCr of 1.94 mg/dL (H)).  Liver Function Tests: Recent Labs  Lab 03/24/19 0355  03/25/19 0658 03/25/19 1600 03/26/19 1045 03/26/19 1600 03/27/19 0715  AST 52*  --   --   --   --   --   --   ALT 50*  --   --   --   --   --   --    ALKPHOS 177*  --   --   --   --   --   --   BILITOT 0.4  --   --   --   --   --   --   PROT 9.4*  --   --   --   --   --   --   ALBUMIN 3.2*  3.1*   < > 3.3* 2.9* 2.8* 3.0* 3.1*   < > = values in this interval not displayed.    CBG: Recent Labs  Lab 03/26/19 1940 03/26/19 2348 03/27/19 0337 03/27/19 0726 03/27/19 1121  GLUCAP 122* 156* 140* 145* 145*     Recent Results (from the past 240 hour(s))  C difficile quick scan w PCR reflex     Status: None   Collection Time: 03/22/19  3:26 PM  Result Value Ref Range Status   C Diff antigen NEGATIVE NEGATIVE Final   C Diff toxin NEGATIVE NEGATIVE Final   C Diff interpretation No C. difficile detected.  Final    Comment: Performed at [facility], [place] [address], [place],  [address]      Radiology Studies: Dg Abd Portable 1v  Result Date: 03/26/2019 CLINICAL DATA:  Nasogastric tube placement EXAM: PORTABLE ABDOMEN - 1 VIEW COMPARISON:  03/19/2019 FINDINGS: Nasogastric tube is looped in the proximal stomach. Dialysis catheter with tip at the lower right atrium. Normal bowel gas pattern. IMPRESSION: 1. Nasogastric tube tip is in the stomach. 2. Dialysis catheter tip is near the inferior cavoatrial junction. Electronically Signed   By: Monte Fantasia M.D.   On: 03/26/2019 04:14       LOS: 69 days   Phillips Climes MD  [facility] Pager on [email or web address]  03/27/2019, 1:08 PM

## 2019-03-27 NOTE — Progress Notes (Signed)
Received call from family, updated on pt. All questions/ concerns answered at this time.

## 2019-03-27 NOTE — Progress Notes (Signed)
Assisted tele visit to patient with family member.  Aldrick Derrig Ann, RN  

## 2019-03-27 NOTE — Progress Notes (Signed)
[place] for Eliquis Indication: Suspected PE  Not on File  Patient Measurements: Height: 5\' 5"  (165.1 cm) Weight: 135 lb 5.8 oz (61.4 kg) IBW/kg (Calculated) : 61.5 Heparin Dosing Weight: 66 kg  Vital Signs: Temp: 98.1 F (36.7 C) (05/23 0745) Temp Source: Oral (05/23 0745) BP: 116/84 (05/23 1000) Pulse Rate: 114 (05/23 1045)  Labs: Recent Labs    03/25/19 0657  03/26/19 1045 03/26/19 1600 03/26/19 2049 03/27/19 0715  HGB 7.8*  --  7.0*  --   --  7.3*  HCT 27.1*  --  24.6*  --   --  24.5*  PLT 387  --  346  --   --  345  APTT 111*  --  72*  --   --  57*  HEPARINUNFRC  --    < > 0.33  --  0.12* 0.18*  CREATININE  --    < > 1.83* 1.95*  --  1.94*   < > = values in this interval not displayed.    Estimated Creatinine Clearance: 40.4 mL/min (A) (by C-G formula based on SCr of 1.94 mg/dL (H)).  Assessment: 24 yom with complications from AEWYB-74 including acute respiratory failure s/p tracheostomy and ATN on CRRT now s/p permcath. CCM transitioned to heparin drip 2/2 suspected PE.   Today, 03/27/19   HL 0.18, remains subtherapeutic   Concurrent heparin via CRRT circuit was increased to 1000 units/hr on 5/23 AM.  RN reports CRRT filter clotted off x3 since yesterday.  SCr 1.94 while on CRRT.  CRRT holiday beginning 5/23.  Hgb 7.3, Plt WNL.    No bleeding or line issues noted  per RN   Goal of Therapy:  Monitor platelets by anticoagulation protocol: Yes   Plan:   Start Eliquis 5 mg PO BID.  Stop heparin IV infusion at the same time Eliquis is given.  Continue to monitor H&H and platelets, bleeding, SCr.   Gretta Arab PharmD, BCPS 03/27/2019 10:58 AM

## 2019-03-27 NOTE — Progress Notes (Signed)
Notified MD that pt coughed up 50-75 mL of frothy red blood from trach, no active distress.  RT notified to change out Tcollar.  MD ordered to hold eliquis dose tonight.

## 2019-03-27 NOTE — Progress Notes (Signed)
Notified Dr Lamonte Sakai that pt was still feeling anxious and complaining of SOB, HR in 120s, tachypneic and O2 Sats of 92%.  MD to enter order for long acting klonipin

## 2019-03-27 NOTE — Progress Notes (Signed)
Subjective:  Seen in room, frothy secretions past 2 days, changed to keep even on crrt yesterday.  Better.   Objective Vital signs in last 24 hours: Vitals:   03/27/19 0745 03/27/19 0750 03/27/19 0800 03/27/19 0815  BP:  118/76 118/81   Pulse: (!) 102  99 96  Resp: (!) 26 (!) 31 (!) 25 (!) 23  Temp: 98.1 F (36.7 C)     TempSrc: Oral     SpO2: 96%  100% 100%  Weight:      Height:       Weight change: 1.5 kg  Intake/Output Summary (Last 24 hours) at 03/27/2019 7494 Last data filed at 03/27/2019 0900 Gross per 24 hour  Intake 1660.21 ml  Output 2041 ml  Net -380.79 ml   Physical Exam: Trach, NG tube , R chest HD cath Pt awake, coughing, looks very ill  Chest coarse bilat, coughing Cor reg  Abd soft ntnd no ascites  Ext no edema LE's or UE's   CXR 5/17 diffuse stable infiltrates CXR 5/21 no chg stable infiltrates  Assessment/ Plan: Pt is a 48 y.o. yo male who was admitted on 02/23/2019 with anuric AKI due to ATN in the setting of COVID   Assessment/Plan: 1. AKI-  On CRRT since 4/21. Now off pressors w/ good BP's.  NO UOP so no signs of renal recovery yet and still requiring RRT. SP TDC. - kept pt + for 3d but developed frothy secretions so back to keep even yesterday - I/O even yesterday - filters clotting and CCM considering transfer to [facility] > will give pt trial off of CRRT for 1-2 days, see how he does - labs ok, K+ borderline will order lokelma 10 qd - If off vent and not requiring ICU care could do intermittent HD at [facility]   2. HTN/vol- normotensive, euvolemic on exam, also see above  3. Anemia- hgb low- supportive care for now- no iron in the setting of sepsis. Will start esa darbe 100 ug /wk SQ.  Cont to transfuse as needed  4. ID-  covid positive. SP IV vanc / zosyn   5. VDRF- on vent/ trach, 30% FiO2, stable   [facility] 03/27/2019, 9:08 AM    Labs: Basic Metabolic Panel: Recent Labs  Lab 03/25/19 1600 03/26/19 1045 03/26/19 1600   NA 136 135 135  K 4.7 5.5* 5.3*  CL 100 101 100  CO2 27 27 28   GLUCOSE 83 124* 99  BUN 30* 30* 35*  CREATININE 1.84* 1.83* 1.95*  CALCIUM 10.0 10.0 10.0  PHOS 3.0 2.9 3.0   Liver Function Tests: Recent Labs  Lab 03/24/19 0355  03/25/19 1600 03/26/19 1045 03/26/19 1600  AST 52*  --   --   --   --   ALT 50*  --   --   --   --   ALKPHOS 177*  --   --   --   --   BILITOT 0.4  --   --   --   --   PROT 9.4*  --   --   --   --   ALBUMIN 3.2*  3.1*   < > 2.9* 2.8* 3.0*   < > = values in this interval not displayed.   No results for input(s): LIPASE, AMYLASE in the last 168 hours. No results for input(s): AMMONIA in the last 168 hours. CBC: Recent Labs  Lab 03/23/19 0615 03/24/19 0355 03/25/19 0657 03/26/19 1045 03/27/19 0715  WBC 22.6* 21.2* 15.8* 13.1* 16.2*  HGB 8.7* 8.0* 7.8* 7.0* 7.3*  HCT 31.0* 28.2* 27.1* 24.6* 24.5*  MCV 102.3* 103.3* 102.7* 103.8* 102.1*  PLT 425* 408* 387 346 345   Cardiac Enzymes: No results for input(s): CKTOTAL, CKMB, CKMBINDEX, TROPONINI in the last 168 hours. CBG: Recent Labs  Lab 03/26/19 1553 03/26/19 1940 03/26/19 2348 03/27/19 0337 03/27/19 0726  GLUCAP 102* 122* 156* 140* 145*    Iron Studies: No results for input(s): IRON, TIBC, TRANSFERRIN, FERRITIN in the last 72 hours. Studies/Results: Dg Abd Portable 1v  Result Date: 03/26/2019 CLINICAL DATA:  Nasogastric tube placement EXAM: PORTABLE ABDOMEN - 1 VIEW COMPARISON:  03/19/2019 FINDINGS: Nasogastric tube is looped in the proximal stomach. Dialysis catheter with tip at the lower right atrium. Normal bowel gas pattern. IMPRESSION: 1. Nasogastric tube tip is in the stomach. 2. Dialysis catheter tip is near the inferior cavoatrial junction. Electronically Signed   By: Monte Fantasia M.D.   On: 03/26/2019 04:14   Medications: Infusions: .  prismasol BGK 4/2.5 300 mL/hr at 03/26/19 1845  .  prismasol BGK 4/2.5 500 mL/hr at 03/27/19 0513  . sodium chloride    . sodium  chloride Stopped (03/21/19 2128)  . feeding supplement (VITAL 1.5 CAL) 45 mL/hr at 03/27/19 0800  . heparin 10,000 units/ 20 mL infusion syringe 1,000 Units/hr (03/27/19 0745)  . heparin 550 Units/hr (03/27/19 0900)  . prismasol BGK 4/2.5 1,500 mL/hr at 03/27/19 3361    Scheduled Medications: . chlorhexidine  15 mL Mouth/Throat BID  . Chlorhexidine Gluconate Cloth  6 each Topical Daily  . famotidine  20 mg Per Tube Daily  . feeding supplement (PRO-STAT SUGAR FREE 64)  60 mL Per Tube BID  . free water  100 mL Per Tube Q8H  . insulin aspart  0-15 Units Subcutaneous Q4H  . insulin aspart  6 Units Subcutaneous Q4H  . insulin glargine  10 Units Subcutaneous BID  . mouth rinse  15 mL Mouth Rinse 10 times per day  . oxyCODONE  5 mg Oral Q8H  . sodium chloride flush  10-40 mL Intracatheter Q12H    have reviewed scheduled and prn medications.

## 2019-03-27 NOTE — Progress Notes (Signed)
Spoke with Dr Jonnie Finner, notified him that pt had clotted off CRRT filter again.  Dr Lamonte Sakai wanted to know if he wanted to continue CRRT or trial him off for time being for possible transfer to [facility].  Dr Jonnie Finner to speak with Dr Lamonte Sakai

## 2019-03-27 NOTE — Progress Notes (Signed)
NAME:  Jonathan [name], MRN:  [id number], DOB:  1971-06-19, LOS: 11 ADMISSION DATE:  02/23/2019, CONSULTATION DATE:  02/23/19 REFERRING MD:  Regenia Skeeter, CHIEF COMPLAINT:  Dyspnea   Brief History   48 year old male admitted on February 23, 2019 with ARDS due to COVID-19.  He has largely recovered from severe ARDS which originally required prone positioning for many days, he developed acute kidney injury requiring continuous renal replacement therapy.  Past Medical History  DM2 HTN  Significant Hospital Events   4/21 admitted and immediately proned on inverse ratio PC ventilation with high PEEP to maintain saturation >85%, Started on NO at 40ppm. 4/22- Tocluzimab X 1 4/22- started CVVHD 4/21-4/24 Proned daily 4/28 -4/30 proned daily , tapered NO to off  4/30 -late evening with increased O2 demands . No paralytics required x 24hr . No vent dysyncrony . Calm on Fent /Versed. Off precedex   5/1 - Remains on CRRT , no acute issues overnight --4.8 L I/O Balance since admit . Continues to Prone on/off 16 on and 8 off . -CXR stable diffuse bilateral infiltrates (slightly improved aeration ) . , Vent .80Fio2 .  5/3 convalescent plasma  Biomarkes improving but severe ARDS persists ad D-dimer still very high (13) without improvement. On IV Heparin gtt. Growing MSSA. SEdation changed to dilaudid gtt. Continues prone 16h and 8h syupine. Currently supine at 80% fio2, peep 18. On TF. STARTED NIMBEX  5/4-5/5-able to stay in the supine position, able to discontinue paralysis and maintain FiO2 0.50, PEEP 8-10.  5/5 hematochezia, bloody gastric aspirate  5/8 followed commands  5/11 extubated  5/12 re-intubated, tracheostomy  5/13 perm cath  5/14 worsening hypoxemia, shock suddenly, RV dilated, no new CXR changes, treated for PE empirically  5/21 doing some t-piece trials  Consults:  PCCM 4/21  Procedures:  4/21 - ETT 7.5 >> 5/12 5/12 Trach >>  4/21 - RIJ CVC [facility] >>5/10  5/10 - CVC >> 5/13 4/22 L IJ HD Cath>5/15 5/13 R IJ Permcath>    Significant Diagnostic Tests:  POC U/S 4/21: Bilateral interstitial pattern with bilateral dense consolidation at bases. Echo shows low-normal LVSF. RV moderately dilated with septal shift.  5/14 Echo> LVEF > 65%, RV dilated, decreased RV function (moderate) with RVSP 60  Micro Data:  Blood cultures -PND SARS-CoV2 02/23/2019 - - POSITIVE HIV 4/21 -neg Quant gold 4/2 1- neg .....................Marland Kitchen resp 4/29 >>rare MSSA and yeast  5/10 blood > neg 5/18 c. Diff > neg  Anti-covid RX and antimicrobial RX  Azithromycin 4/21 >>4/25 Ceftriaxone 4/22 >>4/25 Actemra 4/22 Hydroxychloroquine 4/21 > 4/22 ........................... Ceftriaxone 5/2 >>5/10 Cefepime 5/10>>> 5/12 Zosyn 5/12 >> 5/17 Vancomycin 5/10>>>5/17  Fluconazole 5/12 > 5/17   Interim history/subjective:   Remains on T-piece, now approaching 48 hours vent freedom Still has some periods of anxiety associated with tachypnea, tachycardia.  This can happen when coughing, needs to have a bowel movement, etc. CVVH is still running  Objective   Blood pressure 113/72, pulse (!) 123, temperature 100.1 F (37.8 C), temperature source Axillary, resp. rate (!) 28, height 5\' 5"  (1.651 m), weight 61.4 kg, SpO2 100 %.    FiO2 (%):  [30 %-50 %] 50 %   Intake/Output Summary (Last 24 hours) at 03/27/2019 1330 Last data filed at 03/27/2019 1200 Gross per 24 hour  Intake 1761.22 ml  Output 1786 ml  Net -24.78 ml   Filed Weights   03/25/19 0700 03/26/19 0500 03/27/19 0500  Weight: 59.8 kg 59.9 kg 61.4 kg  Examination:  General: Ill-appearing, thin, no distress HENT: Trach in place, currently on T-piece, clean dry PULM: Coarse bilateral breath sounds, squeaks, crackles CV: Regular, no murmur GI: Soft, nondistended, positive bowel sounds MSK: No deformity Skin: Healing pressure injuries bilateral maxillary region Neuro: Awake, alert, interacts, nods  to questions, moves all extremities with good strength   Resolved Hospital Problem list     Assessment & Plan:  Suspected pulmonary embolism: Acute hypoxemia, RV dilation on echo. Forgo CT angiogram because of renal function (possible recovery?) Plan to transition from heparin infusion to Eliquis 5/23 Plan for 3 months empiric anticoagulation to treat presumed pulmonary embolism He will need a repeat echocardiogram at some point after treatment   Acute respiratory failure with hypoxemia, recovering ARDS from COVID-19 Continue T-piece as he can tolerate.  Hopefully he will not require any further ventilation.  Careful with suctioning since he has had some blood-tinged tracheal secretions Cuff down unless he has to go back to MV Continue to work with PMV.  He has had desaturations when PMV placed. May have difficulty with this until we can either downsize or place a cuffless trach If he can stay off MV for another day consider change to cuffless #6   Acute kidney injury, oliguric, doubt there will be any recovery of renal function Discussed with nephrology today.  We will stop CVVH, give him a dialysis holiday and see how he tolerates particularly from a respiratory standpoint.  We could always restart CVVH or give him intermittent treatment if we feel it is necessary.  If he tolerates intermittent here then we can transfer him to [facility] to receive IHD there.  Anxiety Many of his sedating medications have been able to be decreased.  He still on enteral oxycodone, plan to decrease the frequency 5/23 as a CVVH is held Start low-dose Clonazepam to see if he gets benefit.  Best practice:  Diet: tube feeding, hopeful to avoid PEG depending on swallowing progress Pain/Anxiety/Delirium protocol (if indicated): minimize VAP protocol (if indicated): yes DVT prophylaxis: heparin infusion GI prophylaxis: Stopped famotidine 5/22 Glucose control: SSI Mobility: PT working with patient Code Status:  full Family Communication: [place] updating them today Disposition: ICU.  To [facility] for iHD when we believe he is stable for a progressive care bed  Labs   CBC: Recent Labs  Lab 03/23/19 0615 03/24/19 0355 03/25/19 0657 03/26/19 1045 03/27/19 0715  WBC 22.6* 21.2* 15.8* 13.1* 16.2*  HGB 8.7* 8.0* 7.8* 7.0* 7.3*  HCT 31.0* 28.2* 27.1* 24.6* 24.5*  MCV 102.3* 103.3* 102.7* 103.8* 102.1*  PLT 425* 408* 387 346 846    Basic Metabolic Panel: Recent Labs  Lab 03/23/19 0615  03/24/19 0355  03/25/19 0657 03/25/19 0658 03/25/19 1600 03/26/19 1045 03/26/19 1600 03/27/19 0715  NA 135   < > 136   < >  --  136 136 135 135 134*  K 4.8   < > 4.5   < >  --  5.0 4.7 5.5* 5.3* 5.0  CL 97*   < > 99   < >  --  100 100 101 100 98  CO2 26   < > 28   < >  --  29 27 27 28 29   GLUCOSE 166*   < > 124*   < >  --  122* 83 124* 99 149*  BUN 37*   < > 41*   < >  --  32* 30* 30* 35* 32*  CREATININE 2.04*   < >  2.05*   < >  --  1.82* 1.84* 1.83* 1.95* 1.94*  CALCIUM 10.1   < > 10.1   < >  --  10.2 10.0 10.0 10.0 10.3  MG 3.2*  --  3.0*  --  3.0*  --   --  3.0*  --  3.1*  PHOS 2.8   < > 2.9   < >  --  3.0 3.0 2.9 3.0 3.0   < > = values in this interval not displayed.   GFR: Estimated Creatinine Clearance: 40.4 mL/min (A) (by C-G formula based on SCr of 1.94 mg/dL (H)). Recent Labs  Lab 03/24/19 0355 03/25/19 0657 03/26/19 1045 03/27/19 0715  WBC 21.2* 15.8* 13.1* 16.2*    Liver Function Tests: Recent Labs  Lab 03/24/19 0355  03/25/19 0658 03/25/19 1600 03/26/19 1045 03/26/19 1600 03/27/19 0715  AST 52*  --   --   --   --   --   --   ALT 50*  --   --   --   --   --   --   ALKPHOS 177*  --   --   --   --   --   --   BILITOT 0.4  --   --   --   --   --   --   PROT 9.4*  --   --   --   --   --   --   ALBUMIN 3.2*  3.1*   < > 3.3* 2.9* 2.8* 3.0* 3.1*   < > = values in this interval not displayed.   No results for input(s): LIPASE, AMYLASE in the last 168 hours. No results for input(s):  AMMONIA in the last 168 hours.  ABG    Component Value Date/Time   PHART 7.381 03/18/2019 1112   PCO2ART 42.1 03/18/2019 1112   PO2ART 50.0 (L) 03/18/2019 1112   HCO3 25.1 03/18/2019 1112   TCO2 26 03/18/2019 1112   ACIDBASEDEF 1.0 03/16/2019 0450   O2SAT 85.0 03/18/2019 1112     Coagulation Profile: No results for input(s): INR, PROTIME in the last 168 hours.  Cardiac Enzymes: No results for input(s): CKTOTAL, CKMB, CKMBINDEX, TROPONINI in the last 168 hours.  HbA1C: Hgb A1c MFr Bld  Date/Time Value Ref Range Status  03/23/2019 06:15 AM 5.5 4.8 - 5.6 % Final    Comment:    (NOTE) Pre diabetes:          5.7%-6.4% Diabetes:              >6.4% Glycemic control for   <7.0% adults with diabetes     CBG: Recent Labs  Lab 03/26/19 1940 03/26/19 2348 03/27/19 0337 03/27/19 0726 03/27/19 1121  GLUCAP 122* 156* 140* 145* 145*     Critical care time: N/a     Baltazar Apo, MD, PhD 03/27/2019, 1:30 PM [facility] [phone] or if no answer [phone]

## 2019-03-27 NOTE — Progress Notes (Signed)
ANTICOAGULATION  CONSULT NOTE   Pharmacy Consult for Heparin Indication: Suspected PE  Not on File  Patient Measurements: Height: 5\' 5"  (165.1 cm) Weight: 135 lb 5.8 oz (61.4 kg) IBW/kg (Calculated) : 61.5 Heparin Dosing Weight: 66 kg  Vital Signs: Temp: 99.1 F (37.3 C) (05/23 0600) Temp Source: Axillary (05/23 0600) BP: 118/76 (05/23 0700) Pulse Rate: 100 (05/23 0715)  Labs: Recent Labs    03/25/19 0657  03/25/19 1600 03/25/19 1700 03/26/19 1045 03/26/19 1600 03/26/19 2049  HGB 7.8*  --   --   --  7.0*  --   --   HCT 27.1*  --   --   --  24.6*  --   --   PLT 387  --   --   --  346  --   --   APTT 111*  --   --   --  72*  --   --   HEPARINUNFRC  --    < >  --  0.71* 0.33  --  0.12*  CREATININE  --    < > 1.84*  --  1.83* 1.95*  --    < > = values in this interval not displayed.    Estimated Creatinine Clearance: 40.2 mL/min (A) (by C-G formula based on SCr of 1.95 mg/dL (H)).  Assessment: 7 yom with complications from RAQTM-22 including acute respiratory failure s/p tracheostomy and ATN on CRRT now s/p permcath. CCM transitioned to heparin drip 2/2 suspected PE.   Today, 03/27/19   HL 0.18, remains subtherapeutic   Concurrent heparin via CRRT circuit was increased to 1000 units/hr on 5/23 AM.  RN reports CRRT filter clotted off x3 since yesterday.  Hgb 7.3, Plt WNL.    No bleeding or line issues noted  per RN   Goal of Therapy:  Heparin level 0.3-0.7 units/ml Monitor platelets by anticoagulation protocol: Yes   Plan:   Increase to heparin IV infusion at 600 units/hr  Heparin level in 8 hours  Daily heparin level and CBC  Continue to monitor H&H and platelets, bleeding.  Follow up long-term anticoagulation plans.   Gretta Arab PharmD, BCPS 03/27/2019 7:48 AM

## 2019-03-27 NOTE — Progress Notes (Signed)
Spoke with pts daughter Chrissie Noa.  Updated on pt status.  Scheduled videochat with Warren Lacy

## 2019-03-27 NOTE — Progress Notes (Addendum)
SLP Cancellation Note  Patient Details Name: Aziz Slape MRN: [id number] DOB: [birth year]   Cancelled treatment:       Reason Eval/Treat Not Completed: Medical issues which prohibited therapy. Pt appears very fatigued and uncomfortable in comparison with prior days. His RR is 38 at baseline. His eyes are rolled up in his head at rest though he will briefly make eye contact and gestures to communicate. Pallor is pale. Will defer PMSV trials today though I did bring a green PMSV to try for next session. Left in room.    Maijor Hornig, Katherene Ponto 03/27/2019, 11:50 AM

## 2019-03-27 NOTE — Progress Notes (Signed)
Notified Dr Trellis Moment that pt had temp of 101 F oral, PRN tylenol for pain only, MD ordered to give tylenol for fever

## 2019-03-27 NOTE — Progress Notes (Signed)
Nutrition Brief Note  RD consulted to assess whether pt would be a candidate for a K+ restricted formula at this time. Per Nephrology, plan is to switch from CRRT to intermittent CRRT today.  K+ is 5.0 today which is high end of WDL. Dicussed concerns with switching to K+ restricted formula (Nepro) with MD via secure chat including concern for also restricting phosphorus (current phos is 3.0) and with common intolerance issues with Nepro formula due to high fat concentration. Pt has been on Vital products since 4/21 and has tolerated these well. Elevated K+ seems to be a more recent issue. MD started pt on Lokelma today.  Per discussion with MD, will hold off on switching formula at this time and continue to monitor and reevaluate.   Gaynell Face, MS, RD, LDN Inpatient Clinical Dietitian Pager: [phone] Weekend/After Hours: [phone]

## 2019-03-27 NOTE — Progress Notes (Signed)
RT called to patient room due to patient coughing up bright, red, bloody secretions.  Upon arrival patient wasn't noted to be in distress and sats were stable however trach suction tubing was full of bright red blood.  Attempted to suction out of catheter however was not able to suction out.  Removed tubing from both sides and removed suction catheter and was able to remove a large size blood clot from tubing.  RN paged MD.  Patient sats currently at 100% preparing to face time with family.  Will continue to monitor.

## 2019-03-27 NOTE — Progress Notes (Signed)
Dr Jonnie Finner rounding.  Update on pt status.  Increasedd fluid removal goal to -64mL/hr and increased heparin to 1000 units/hr due to clotting.

## 2019-03-27 NOTE — Progress Notes (Signed)
Notified Dr Justin Mend that pt had clotted off CRRT filter two times since anticoagulation had been turned off, MD ordered to restart heparin through CRRT at 500 units/hr

## 2019-03-28 LAB — HEMOGLOBIN AND HEMATOCRIT, BLOOD
HCT: 24 % — ABNORMAL LOW (ref 39.0–52.0)
Hemoglobin: 7.4 g/dL — ABNORMAL LOW (ref 13.0–17.0)

## 2019-03-28 LAB — CBC
HCT: 21 % — ABNORMAL LOW (ref 39.0–52.0)
Hemoglobin: 6.1 g/dL — CL (ref 13.0–17.0)
MCH: 28.9 pg (ref 26.0–34.0)
MCHC: 29 g/dL — ABNORMAL LOW (ref 30.0–36.0)
MCV: 99.5 fL (ref 80.0–100.0)
Platelets: 335 10*3/uL (ref 150–400)
RBC: 2.11 MIL/uL — ABNORMAL LOW (ref 4.22–5.81)
RDW: 16 % — ABNORMAL HIGH (ref 11.5–15.5)
WBC: 12.8 10*3/uL — ABNORMAL HIGH (ref 4.0–10.5)
nRBC: 0.2 % (ref 0.0–0.2)

## 2019-03-28 LAB — RENAL FUNCTION PANEL
Albumin: 2.8 g/dL — ABNORMAL LOW (ref 3.5–5.0)
Albumin: 2.8 g/dL — ABNORMAL LOW (ref 3.5–5.0)
Anion gap: 11 (ref 5–15)
Anion gap: 12 (ref 5–15)
BUN: 87 mg/dL — ABNORMAL HIGH (ref 6–20)
BUN: 114 mg/dL — ABNORMAL HIGH (ref 6–20)
CO2: 25 mmol/L (ref 22–32)
CO2: 24 mmol/L (ref 22–32)
Calcium: 11.7 mg/dL — ABNORMAL HIGH (ref 8.9–10.3)
Calcium: 11.8 mg/dL — ABNORMAL HIGH (ref 8.9–10.3)
Chloride: 98 mmol/L (ref 98–111)
Chloride: 97 mmol/L — ABNORMAL LOW (ref 98–111)
Creatinine, Ser: 4.37 mg/dL — ABNORMAL HIGH (ref 0.61–1.24)
Creatinine, Ser: 5.85 mg/dL — ABNORMAL HIGH (ref 0.61–1.24)
GFR calc Af Amer: 17 mL/min — ABNORMAL LOW (ref >60)
GFR calc Af Amer: 12 mL/min — ABNORMAL LOW (ref >60)
GFR calc non Af Amer: 15 mL/min — ABNORMAL LOW (ref >60)
GFR calc non Af Amer: 10 mL/min — ABNORMAL LOW (ref >60)
Glucose, Bld: 135 mg/dL — ABNORMAL HIGH (ref 70–99)
Glucose, Bld: 151 mg/dL — ABNORMAL HIGH (ref 70–99)
Phosphorus: 7.3 mg/dL — ABNORMAL HIGH (ref 2.5–4.6)
Phosphorus: 9 mg/dL — ABNORMAL HIGH (ref 2.5–4.6)
Potassium: 5.4 mmol/L — ABNORMAL HIGH (ref 3.5–5.1)
Potassium: 5.6 mmol/L — ABNORMAL HIGH (ref 3.5–5.1)
Sodium: 134 mmol/L — ABNORMAL LOW (ref 135–145)
Sodium: 133 mmol/L — ABNORMAL LOW (ref 135–145)

## 2019-03-28 LAB — ABO/RH: ABO/RH(D): O POS

## 2019-03-28 LAB — PREPARE RBC (CROSSMATCH)

## 2019-03-28 LAB — GLUCOSE, CAPILLARY
Glucose-Capillary: 114 mg/dL — ABNORMAL HIGH (ref 70–99)
Glucose-Capillary: 138 mg/dL — ABNORMAL HIGH (ref 70–99)
Glucose-Capillary: 139 mg/dL — ABNORMAL HIGH (ref 70–99)
Glucose-Capillary: 142 mg/dL — ABNORMAL HIGH (ref 70–99)
Glucose-Capillary: 148 mg/dL — ABNORMAL HIGH (ref 70–99)

## 2019-03-28 MED ORDER — LIP MEDEX EX OINT
TOPICAL_OINTMENT | CUTANEOUS | Status: DC | PRN
  Filled 2019-03-28: qty 7

## 2019-03-28 MED ORDER — SODIUM CHLORIDE 0.9% IV SOLUTION
Freq: Once | INTRAVENOUS | Status: AC
  Administered 2019-03-28: 11:00:00 via INTRAVENOUS

## 2019-03-28 MED ORDER — SODIUM ZIRCONIUM CYCLOSILICATE 10 G PO PACK
10.0000 g | PACK | Freq: Two times a day (BID) | ORAL | Status: DC
  Administered 2019-03-28 – 2019-04-01 (×7): 10 g via ORAL
  Filled 2019-03-28 (×9): qty 1

## 2019-03-28 NOTE — Progress Notes (Signed)
CRITICAL VALUE ALERT  Critical Value: Hemoglobin 6.1  Date & Time Notied: 03/28/19 at 0950  Provider Notified: Dr. Waldron Labs   Orders Received/Actions taken: Order received to transfuse 2 units.

## 2019-03-28 NOTE — Progress Notes (Signed)
Spoke with patients daughter, updated on pt status.  Coordinated with [employer] to video chat.

## 2019-03-28 NOTE — Progress Notes (Signed)
NAME:  Jonathan [name], MRN:  [id number], DOB:  12/03/1970, LOS: 11 ADMISSION DATE:  02/23/2019, CONSULTATION DATE:  02/23/19 REFERRING MD:  Regenia Skeeter, CHIEF COMPLAINT:  Dyspnea   Brief History   48 year old male admitted on February 23, 2019 with ARDS due to COVID-19.  He has largely recovered from severe ARDS which originally required prone positioning for many days, he developed acute kidney injury requiring continuous renal replacement therapy.  Past Medical History  DM2 HTN  Significant Hospital Events   4/21 admitted and immediately proned on inverse ratio PC ventilation with high PEEP to maintain saturation >85%, Started on NO at 40ppm. 4/22- Tocluzimab X 1 4/22- started CVVHD 4/21-4/24 Proned daily 4/28 -4/30 proned daily , tapered NO to off  4/30 -late evening with increased O2 demands . No paralytics required x 24hr . No vent dysyncrony . Calm on Fent /Versed. Off precedex   5/1 - Remains on CRRT , no acute issues overnight --4.8 L I/O Balance since admit . Continues to Prone on/off 16 on and 8 off . -CXR stable diffuse bilateral infiltrates (slightly improved aeration ) . , Vent .80Fio2 .  5/3 convalescent plasma  Biomarkes improving but severe ARDS persists ad D-dimer still very high (13) without improvement. On IV Heparin gtt. Growing MSSA. SEdation changed to dilaudid gtt. Continues prone 16h and 8h syupine. Currently supine at 80% fio2, peep 18. On TF. STARTED NIMBEX  5/4-5/5-able to stay in the supine position, able to discontinue paralysis and maintain FiO2 0.50, PEEP 8-10.  5/5 hematochezia, bloody gastric aspirate  5/8 followed commands  5/11 extubated  5/12 re-intubated, tracheostomy  5/13 perm cath  5/14 worsening hypoxemia, shock suddenly, RV dilated, no new CXR changes, treated for PE empirically  5/21 doing t-piece trials  Consults:  PCCM 4/21  Procedures:  4/21 - ETT 7.5 >> 5/12 5/12 Trach >>  4/21 - RIJ CVC [facility] >>5/10 5/10  - CVC >> 5/13 4/22 L IJ HD Cath>5/15 5/13 R IJ Permcath>    Significant Diagnostic Tests:  POC U/S 4/21: Bilateral interstitial pattern with bilateral dense consolidation at bases. Echo shows low-normal LVSF. RV moderately dilated with septal shift.  5/14 Echo> LVEF > 65%, RV dilated, decreased RV function (moderate) with RVSP 60  Micro Data:  Blood cultures -PND SARS-CoV2 02/23/2019 - - POSITIVE HIV 4/21 -neg Quant gold 4/2 1- neg .....................Marland Kitchen resp 4/29 >>rare MSSA and yeast  5/10 blood > neg 5/18 c. Diff > neg  Anti-covid RX and antimicrobial RX  Azithromycin 4/21 >>4/25 Ceftriaxone 4/22 >>4/25 Actemra 4/22 Hydroxychloroquine 4/21 > 4/22 ........................... Ceftriaxone 5/2 >>5/10 Cefepime 5/10>>> 5/12 Zosyn 5/12 >> 5/17 Vancomycin 5/10>>>5/17  Fluconazole 5/12 > 5/17   Interim history/subjective:  Remains on T-piece, now 3 days Anxiety improved with initiation clonazepam 5/23 He had some hemoptysis and a clot with cough yesterday, Eliquis was held. Hemoglobin 7.3 >> 6.1 this morning CVVH stopped 5/23 to ensure that he can tolerate intermittent treatment  Objective   Blood pressure 131/69, pulse (!) 113, temperature 98.4 F (36.9 C), temperature source Oral, resp. rate (!) 34, height 5\' 5"  (1.651 m), weight 61.4 kg, SpO2 91 %.    FiO2 (%):  [40 %-55 %] 40 %   Intake/Output Summary (Last 24 hours) at 03/28/2019 1350 Last data filed at 03/28/2019 1300 Gross per 24 hour  Intake 1380 ml  Output 0 ml  Net 1380 ml   Filed Weights   03/25/19 0700 03/26/19 0500 03/27/19 0500  Weight: 59.8 kg  59.9 kg 61.4 kg    Examination:  General: Chronically ill but in no distress today, good spirits HENT: Lur[place] Idol is in place, currently on a T-piece with filter, clean and dry.  I do not see any blood in the suctioning tubing PULM: Coarse bilateral breath sounds, crackles, squeaks CV: Regular, no murmur GI: Soft, nondistended, positive bowel sounds MSK:  No deformities Skin: Healing pressure injuries bilateral maxillary region Neuro: A awake, alert, interacts, nods to questions, follows commands, moves all extremities with good strength   Resolved Hospital Problem list     Assessment & Plan:  Suspected pulmonary embolism: Acute hypoxemia, RV dilation on echo. Forgo CT angiogram because of renal function (possible recovery?) Eliquis held 5/23 after episode of bloody tracheal secretions, clot.  Appears to have subsided.  Continue to hold for another 24 hours given his drop in hemoglobin.  Consider restart 5/25 Plan for 3 months empiric anticoagulation if he can tolerate He will need a repeat echocardiogram at some point after treatment   Acute respiratory failure with hypoxemia, recovering ARDS from COVID-19 Continue T-piece.  I believe we can take the ventilator out of the room Continue cuff down Should be able to change to cuffless trach once we ensure no further tracheal bleeding, to a #6 or #4 cuffless. Continue to work with PMV for speaking, diet Minimize suctioning  Acute kidney injury, oliguric, doubt there will be any recovery of renal function Discussed with nephrology today.  We will stop CVVH, give him a dialysis holiday and see how he tolerates particularly from a respiratory standpoint.  We could always restart CVVH or give him intermittent treatment if we feel it is necessary.  If he tolerates intermittent here then we can transfer him to [facility] to receive IHD there.  Anemia.  No clear source of this much blood loss although he did have some bloody secretions suctioned 5/23.  Consider contribution of dilution since his CVVH was stopped Eliquis on hold as above Need to stop the frequent suctioning as I believe it is contributing to the bleeding (he sometimes asks to be suctioned) Follow CBC 2 units PRBC today, careful with volume status since he is off dialysis  Anxiety Continue scheduled oxycodone and wean Benefited from  low dose clonazepam > continue  Best practice:  Diet: tube feeding, hopeful to avoid PEG depending on swallowing progress Pain/Anxiety/Delirium protocol (if indicated): minimize VAP protocol (if indicated): yes DVT prophylaxis: heparin infusion GI prophylaxis: Stopped famotidine 5/22 Glucose control: SSI Mobility: PT working with patient Code Status: full Family Communication: [place] updating them today Disposition: ICU.  To [facility] for iHD when we believe he is stable for a progressive care bed  Labs   CBC: Recent Labs  Lab 03/24/19 0355 03/25/19 0657 03/26/19 1045 03/27/19 0715 03/28/19 0514  WBC 21.2* 15.8* 13.1* 16.2* 12.8*  HGB 8.0* 7.8* 7.0* 7.3* 6.1*  HCT 28.2* 27.1* 24.6* 24.5* 21.0*  MCV 103.3* 102.7* 103.8* 102.1* 99.5  PLT 408* 387 346 345 188    Basic Metabolic Panel: Recent Labs  Lab 03/23/19 0615  03/24/19 0355  03/25/19 0657  03/25/19 1600 03/26/19 1045 03/26/19 1600 03/27/19 0715 03/28/19 0514  NA 135   < > 136   < >  --    < > 136 135 135 134* 134*  K 4.8   < > 4.5   < >  --    < > 4.7 5.5* 5.3* 5.0 5.4*  CL 97*   < > 99   < >  --    < >  100 101 100 98 98  CO2 26   < > 28   < >  --    < > 27 27 28 29 25   GLUCOSE 166*   < > 124*   < >  --    < > 83 124* 99 149* 135*  BUN 37*   < > 41*   < >  --    < > 30* 30* 35* 32* 87*  CREATININE 2.04*   < > 2.05*   < >  --    < > 1.84* 1.83* 1.95* 1.94* 4.37*  CALCIUM 10.1   < > 10.1   < >  --    < > 10.0 10.0 10.0 10.3 11.7*  MG 3.2*  --  3.0*  --  3.0*  --   --  3.0*  --  3.1*  --   PHOS 2.8   < > 2.9   < >  --    < > 3.0 2.9 3.0 3.0 7.3*   < > = values in this interval not displayed.   GFR: Estimated Creatinine Clearance: 18 mL/min (A) (by C-G formula based on SCr of 4.37 mg/dL (H)). Recent Labs  Lab 03/25/19 0657 03/26/19 1045 03/27/19 0715 03/28/19 0514  WBC 15.8* 13.1* 16.2* 12.8*    Liver Function Tests: Recent Labs  Lab 03/24/19 0355  03/25/19 1600 03/26/19 1045 03/26/19 1600 03/27/19 0715  03/28/19 0514  AST 52*  --   --   --   --   --   --   ALT 50*  --   --   --   --   --   --   ALKPHOS 177*  --   --   --   --   --   --   BILITOT 0.4  --   --   --   --   --   --   PROT 9.4*  --   --   --   --   --   --   ALBUMIN 3.2*  3.1*   < > 2.9* 2.8* 3.0* 3.1* 2.8*   < > = values in this interval not displayed.   No results for input(s): LIPASE, AMYLASE in the last 168 hours. No results for input(s): AMMONIA in the last 168 hours.  ABG    Component Value Date/Time   PHART 7.381 03/18/2019 1112   PCO2ART 42.1 03/18/2019 1112   PO2ART 50.0 (L) 03/18/2019 1112   HCO3 25.1 03/18/2019 1112   TCO2 26 03/18/2019 1112   ACIDBASEDEF 1.0 03/16/2019 0450   O2SAT 85.0 03/18/2019 1112     Coagulation Profile: No results for input(s): INR, PROTIME in the last 168 hours.  Cardiac Enzymes: No results for input(s): CKTOTAL, CKMB, CKMBINDEX, TROPONINI in the last 168 hours.  HbA1C: Hgb A1c MFr Bld  Date/Time Value Ref Range Status  03/23/2019 06:15 AM 5.5 4.8 - 5.6 % Final    Comment:    (NOTE) Pre diabetes:          5.7%-6.4% Diabetes:              >6.4% Glycemic control for   <7.0% adults with diabetes     CBG: Recent Labs  Lab 03/27/19 2008 03/28/19 0000 03/28/19 0351 03/28/19 0736 03/28/19 1136  GLUCAP 122* 139* 114* 148* 138*     Critical care time: N/a     Baltazar Apo, MD, PhD 03/28/2019, 1:50 PM [facility] [phone]  or if no answer [phone]

## 2019-03-28 NOTE — Progress Notes (Signed)
Subjective:  CRRT held yest.  K+ up and B/Cr up. No UOP. +1.2 L overall  Objective Vital signs in last 24 hours: Vitals:   03/28/19 1300 03/28/19 1343 03/28/19 1345 03/28/19 1359  BP: 125/66 131/69  131/65  Pulse: (!) 110 (!) 114 (!) 113 (!) 114  Resp: (!) 31 (!) 30 (!) 34 (!) 26  Temp:  98.4 F (36.9 C)  98.1 F (36.7 C)  TempSrc:  Oral  Oral  SpO2: (!) 89% 90% 91% 92%  Weight:      Height:       Weight change:   Intake/Output Summary (Last 24 hours) at 03/28/2019 1410 Last data filed at 03/28/2019 1300 Gross per 24 hour  Intake 1335 ml  Output 0 ml  Net 1335 ml   Physical Exam:  Patient not examined directly given COVID-19 + status, utilizing exam of the primary team and observations of RN's.     CXR 5/17 diffuse stable infiltrates CXR 5/21 no chg stable infiltrates  Assessment/ Plan: Pt is a 48 y.o. yo male who was admitted on 02/23/2019 with anuric AKI due to ATN in the setting of COVID   Assessment/Plan: 1. AKI-  On CRRT since 4/21. Now off pressors w/ good BP's.  NO UOP so no signs of renal recovery yet and still requiring RRT. SP TDC. - kept pt + for 3d but developed frothy secretions so back to keep even yesterday - I/O even yesterday - filters clotting, we are holding CRRT since yest 5/23 to see if may be able to tolerate intermittent HD.  K+ up slightly, B/Cr up. Will assess afternoon labs and make decision to resume CRRT tonight or tomorrow at the latest - ^ K+  - ordered lokelma 10 qd , follow - If off vent and not requiring ICU care could do intermittent HD at [facility]   2. HTN/vol- normotensive  3. Anemia- hgb low- supportive care for now- no iron in the setting of sepsis. Will started darbe 100 ug /wk SQ 1st dose 5/23.  Cont to transfuse as needed  4. ID-  covid positive. SP IV course of IV abx   5. VDRF- sp trach, off vent almost 48 hrs, on T-piece , per [facility] 03/28/2019, 2:10 PM    Labs: Basic Metabolic  Panel: Recent Labs  Lab 03/26/19 1600 03/27/19 0715 03/28/19 0514  NA 135 134* 134*  K 5.3* 5.0 5.4*  CL 100 98 98  CO2 28 29 25   GLUCOSE 99 149* 135*  BUN 35* 32* 87*  CREATININE 1.95* 1.94* 4.37*  CALCIUM 10.0 10.3 11.7*  PHOS 3.0 3.0 7.3*   Liver Function Tests: Recent Labs  Lab 03/24/19 0355  03/26/19 1600 03/27/19 0715 03/28/19 0514  AST 52*  --   --   --   --   ALT 50*  --   --   --   --   ALKPHOS 177*  --   --   --   --   BILITOT 0.4  --   --   --   --   PROT 9.4*  --   --   --   --   ALBUMIN 3.2*  3.1*   < > 3.0* 3.1* 2.8*   < > = values in this interval not displayed.   No results for input(s): LIPASE, AMYLASE in the last 168 hours. No results for input(s): AMMONIA in the last 168 hours. CBC: Recent Labs  Lab  03/24/19 0355 03/25/19 0657 03/26/19 1045 03/27/19 0715 03/28/19 0514  WBC 21.2* 15.8* 13.1* 16.2* 12.8*  HGB 8.0* 7.8* 7.0* 7.3* 6.1*  HCT 28.2* 27.1* 24.6* 24.5* 21.0*  MCV 103.3* 102.7* 103.8* 102.1* 99.5  PLT 408* 387 346 345 335   Cardiac Enzymes: No results for input(s): CKTOTAL, CKMB, CKMBINDEX, TROPONINI in the last 168 hours. CBG: Recent Labs  Lab 03/27/19 2008 03/28/19 0000 03/28/19 0351 03/28/19 0736 03/28/19 1136  GLUCAP 122* 139* 114* 148* 138*    Iron Studies: No results for input(s): IRON, TIBC, TRANSFERRIN, FERRITIN in the last 72 hours. Studies/Results: Dg Abd 1 View  Result Date: 03/27/2019 CLINICAL DATA:  Nasogastric tube placement EXAM: ABDOMEN - 1 VIEW COMPARISON:  03/26/2019. FINDINGS: 1715 hours. The nasogastric tube remains looped in the proximal stomach, unchanged. The visualized bowel gas pattern is normal. No evidence of bowel wall thickening or free intraperitoneal air. There are no suspicious abdominal calcifications. IMPRESSION: Unchanged position of the nasogastric tube, looped in the proximal stomach. Electronically Signed   By: Richardean Sale M.D.   On: 03/27/2019 17:55   Medications: Infusions: .  sodium chloride    . sodium chloride Stopped (03/21/19 2128)  . feeding supplement (VITAL 1.5 CAL) 45 mL/hr at 03/27/19 2000    Scheduled Medications: . chlorhexidine  15 mL Mouth Rinse BID  . Chlorhexidine Gluconate Cloth  6 each Topical Daily  . clonazePAM  0.25 mg Per Tube Daily  . darbepoetin (ARANESP) injection - NON-DIALYSIS  100 mcg Subcutaneous Q Sat-1800  . famotidine  20 mg Per Tube Daily  . feeding supplement (PRO-STAT SUGAR FREE 64)  60 mL Per Tube BID  . free water  100 mL Per Tube Q8H  . insulin aspart  0-15 Units Subcutaneous Q4H  . insulin aspart  6 Units Subcutaneous Q4H  . insulin glargine  10 Units Subcutaneous BID  . mouth rinse  15 mL Mouth Rinse q12n4p  . oxyCODONE  5 mg Oral Q12H  . sodium chloride flush  10-40 mL Intracatheter Q12H  . sodium zirconium cyclosilicate  10 g Oral BID    have reviewed scheduled and prn medications.

## 2019-03-28 NOTE — Progress Notes (Signed)
PROGRESS NOTE  Jonathan [name] YPP:[id number] DOB: [birth year] DOA: 02/23/2019  PCP: Patient, No Pcp Per  Brief History/Interval Summary: 48 y.o. male [place] speaking, arrived from [place] on 02/13/2019 and was living in close quarters with 6 other workers. He developed increasing rrespiratory distress and was intubated for ARDS due to COVID-19 infection. He was started on CRRT due to ATN, paralyzed and prone. He was transferred to [facility] for further management. He was eventually extubated, but quickly became short of breath again and aspirated when he started vomiting. He was re intubated and ended up with tracheostomy.   Events: 02/23/2019 admitted to the ICU. 02/24/2019 started on CRRT 4/21-4/24 Proned daily 4/28 -4/30 proned daily  03/04/2019 increase in oxygen demand require paralytic for 24 hours. 03/09/2019 hematochezia from gastric aspirate  Lines and tubes: 4/21 - ETT >>5.11.2020 4/21 - RIJ CVC 4/22 L IJ HD Cath> 5/13 Permacath right  Antibiotics: 02/23/2019-425 2020 azithromycin and Rocephin 02/23/2019-02/24/2019 03/06/2019 Rocephin 5/11-5/17 Vancomycin 5/12-5/18 Zosyn 5/12-5/17 Fluconazole for candida in tracheal aspirate and recent Actemra  Subjective/Interval History: Patient awake alert.  Has some increased red color frothy secretions from his trach.   Assessment/Plan:  Acute Hypoxic Resp. Failure due to Acute Covid 19 Viral Illness/ARDS  Fever: No recent fever Oxygen requirements: Tracheostomy status.  Was on trach collar for last 24 hours.  35% FiO2.  Saturating in the 90s. Antibiotics: Not on any antibiotics currently Steroids: Not on steroids at this time Diuretics: Not on any diuretics Actemra: Received on 4/22 Convalescent Plasma: On 5/3 Vitamin C and Zinc: None  Patient has been stable from a respiratory standpoint.  With no vent support requirement over last 3 days, remains on T-piece. -Volume management with CRRT, on hold for last 24  hours. I-Could be a candidate for CIR.  Speech therapy is following.  Presumed acute PE Patient had an acute episode of hypoxia with respiratory decompensation.  Echocardiogram showed right-sided strain.  Patient was empiricaly placed on IV heparin due to concern for underlying pulmonary embolism.  Lower extremity Doppler studies negative.  3 months of anticoagulation recommended.  -Usually on heparin GTT, transition to Eliquis -Eliquis currently on hold given hemoglobin of 6.1, please see discussion below.  Septic shock Patient has been weaned off of pressors.  Acute kidney injury due to ATN Patient remains on CRRT.  Nephrology is following and managing.  No signs of renal recovery.  No urine output . -On CRRT since 4/21, has been held since yesterday 5/23, if he will be able to tolerate intermittent hemodialysis, creatinine and potassium slightly up, Lokelma has been increased, but he received 2 units PRBC today, will reassess after he received blood .  Anemia due to chronic illness and possible acute GI blood loss -As per staff, no evidence of significant GI bleed, normal color stool maybe some delusional effect over last 24 hours "hemoglobin dropped to 6.1 as he is off CRRT . -Transfuse 2 units of RBCs, hold Eliquis, recheck CBC in a.m. .  Leukocytosis Patient remains afebrile.  WBC is improving.  Stool studies were negative for C. difficile a few days ago.  Continue to monitor.     Diabetes mellitus type 2 Patient on basal insulin as well as sliding scale coverage.  HbA1c 5.5.  Low CBG values noted.  We will cut back on his basal insulin.    FEN On CRRT.  Monitor electrolytes.  Labs are pending from this morning.  Continue tube feedings.    Pressure injury on  the skin of nose Local wound care.   DVT Prophylaxis: On heparin infusion  Lab Results  Component Value Date   PLT 345 03/27/2019     PUD Prophylaxis: On famotidine Code Status: Partial code Family Communication:   Wife Energy manager number [phone].  Called and updated yesterday, will call again today  disposition Plan: Patient will remain in ICU for now   Consultants: Pulmonology.  Nephrology.     Medications:  Scheduled: . chlorhexidine  15 mL Mouth Rinse BID  . Chlorhexidine Gluconate Cloth  6 each Topical Daily  . clonazePAM  0.25 mg Per Tube Daily  . darbepoetin (ARANESP) injection - NON-DIALYSIS  100 mcg Subcutaneous Q Sat-1800  . famotidine  20 mg Per Tube Daily  . feeding supplement (PRO-STAT SUGAR FREE 64)  60 mL Per Tube BID  . free water  100 mL Per Tube Q8H  . insulin aspart  0-15 Units Subcutaneous Q4H  . insulin aspart  6 Units Subcutaneous Q4H  . insulin glargine  10 Units Subcutaneous BID  . mouth rinse  15 mL Mouth Rinse q12n4p  . oxyCODONE  5 mg Oral Q12H  . sodium chloride flush  10-40 mL Intracatheter Q12H  . sodium zirconium cyclosilicate  10 g Oral BID   Continuous: . sodium chloride    . sodium chloride Stopped (03/21/19 2128)  . feeding supplement (VITAL 1.5 CAL) 45 mL/hr at 03/27/19 2000   PQZ:RAQTM/AUQJFHLK arterial line **AND** sodium chloride, sodium chloride, acetaminophen (TYLENOL) oral liquid 160 mg/5 mL, glycopyrrolate, heparin, lip balm, metoprolol tartrate, ondansetron (ZOFRAN) IV, oxyCODONE, sodium chloride flush   Objective:  Vital Signs  Vitals:   03/28/19 1300 03/28/19 1343 03/28/19 1345 03/28/19 1359  BP: 125/66 131/69  131/65  Pulse: (!) 110 (!) 114 (!) 113 (!) 114  Resp: (!) 31 (!) 30 (!) 34 (!) 26  Temp:  98.4 F (36.9 C)  98.1 F (36.7 C)  TempSrc:  Oral  Oral  SpO2: (!) 89% 90% 91% 92%  Weight:      Height:        Intake/Output Summary (Last 24 hours) at 03/28/2019 1431 Last data filed at 03/28/2019 1300 Gross per 24 hour  Intake 1335 ml  Output 0 ml  Net 1335 ml   Filed Weights   03/25/19 0700 03/26/19 0500 03/27/19 0500  Weight: 59.8 kg 59.9 kg 61.4 kg   Awake Alert, sitting in bed, in no apparent  distress Trach in place, currently on T-piece Coarse bilateral respiratory sounds, scattered crackles Regular rate and rhythm, no rubs or gallops +ve B.Sounds, Abd Soft, No tenderness, No rebound - guarding or rigidity. No Cyanosis, Clubbing or edema, No new Rash or bruise      Lab Results:  Data Reviewed: I have personally reviewed following labs and imaging studies  CBC: Recent Labs  Lab 03/24/19 0355 03/25/19 0657 03/26/19 1045 03/27/19 0715 03/28/19 0514  WBC 21.2* 15.8* 13.1* 16.2* 12.8*  HGB 8.0* 7.8* 7.0* 7.3* 6.1*  HCT 28.2* 27.1* 24.6* 24.5* 21.0*  MCV 103.3* 102.7* 103.8* 102.1* 99.5  PLT 408* 387 346 345 562    Basic Metabolic Panel: Recent Labs  Lab 03/23/19 0615  03/24/19 0355  03/25/19 0657  03/25/19 1600 03/26/19 1045 03/26/19 1600 03/27/19 0715 03/28/19 0514  NA 135   < > 136   < >  --    < > 136 135 135 134* 134*  K 4.8   < > 4.5   < >  --    < >  4.7 5.5* 5.3* 5.0 5.4*  CL 97*   < > 99   < >  --    < > 100 101 100 98 98  CO2 26   < > 28   < >  --    < > 27 27 28 29 25   GLUCOSE 166*   < > 124*   < >  --    < > 83 124* 99 149* 135*  BUN 37*   < > 41*   < >  --    < > 30* 30* 35* 32* 87*  CREATININE 2.04*   < > 2.05*   < >  --    < > 1.84* 1.83* 1.95* 1.94* 4.37*  CALCIUM 10.1   < > 10.1   < >  --    < > 10.0 10.0 10.0 10.3 11.7*  MG 3.2*  --  3.0*  --  3.0*  --   --  3.0*  --  3.1*  --   PHOS 2.8   < > 2.9   < >  --    < > 3.0 2.9 3.0 3.0 7.3*   < > = values in this interval not displayed.    GFR: Estimated Creatinine Clearance: 18 mL/min (A) (by C-G formula based on SCr of 4.37 mg/dL (H)).  Liver Function Tests: Recent Labs  Lab 03/24/19 0355  03/25/19 1600 03/26/19 1045 03/26/19 1600 03/27/19 0715 03/28/19 0514  AST 52*  --   --   --   --   --   --   ALT 50*  --   --   --   --   --   --   ALKPHOS 177*  --   --   --   --   --   --   BILITOT 0.4  --   --   --   --   --   --   PROT 9.4*  --   --   --   --   --   --   ALBUMIN 3.2*   3.1*   < > 2.9* 2.8* 3.0* 3.1* 2.8*   < > = values in this interval not displayed.    CBG: Recent Labs  Lab 03/27/19 2008 03/28/19 0000 03/28/19 0351 03/28/19 0736 03/28/19 1136  GLUCAP 122* 139* 114* 148* 138*     Recent Results (from the past 240 hour(s))  C difficile quick scan w PCR reflex     Status: None   Collection Time: 03/22/19  3:26 PM  Result Value Ref Range Status   C Diff antigen NEGATIVE NEGATIVE Final   C Diff toxin NEGATIVE NEGATIVE Final   C Diff interpretation No C. difficile detected.  Final    Comment: Performed at [facility], [place] [address], [place], [place] [address]      Radiology Studies: Dg Abd 1 View  Result Date: 03/27/2019 CLINICAL DATA:  Nasogastric tube placement EXAM: ABDOMEN - 1 VIEW COMPARISON:  03/26/2019. FINDINGS: 1715 hours. The nasogastric tube remains looped in the proximal stomach, unchanged. The visualized bowel gas pattern is normal. No evidence of bowel wall thickening or free intraperitoneal air. There are no suspicious abdominal calcifications. IMPRESSION: Unchanged position of the nasogastric tube, looped in the proximal stomach. Electronically Signed   By: Richardean Sale M.D.   On: 03/27/2019 17:55       LOS: 33 days   Phillips Climes MD  [facility] Pager on [email or web address]  03/28/2019, 2:31 PM

## 2019-03-29 DIAGNOSIS — J9600 Acute respiratory failure, unspecified whether with hypoxia or hypercapnia: Secondary | ICD-10-CM

## 2019-03-29 LAB — TYPE AND SCREEN
ABO/RH(D): O POS
Antibody Screen: NEGATIVE
Unit division: 0
Unit division: 0

## 2019-03-29 LAB — BPAM RBC
Blood Product Expiration Date: 202006122359
Blood Product Expiration Date: 202006122359
ISSUE DATE / TIME: 202005241133
ISSUE DATE / TIME: 202005242323
Unit Type and Rh: 5100
Unit Type and Rh: 5100

## 2019-03-29 LAB — GLUCOSE, CAPILLARY
Glucose-Capillary: 109 mg/dL — ABNORMAL HIGH (ref 70–99)
Glucose-Capillary: 113 mg/dL — ABNORMAL HIGH (ref 70–99)
Glucose-Capillary: 128 mg/dL — ABNORMAL HIGH (ref 70–99)
Glucose-Capillary: 140 mg/dL — ABNORMAL HIGH (ref 70–99)
Glucose-Capillary: 141 mg/dL — ABNORMAL HIGH (ref 70–99)
Glucose-Capillary: 160 mg/dL — ABNORMAL HIGH (ref 70–99)
Glucose-Capillary: 160 mg/dL — ABNORMAL HIGH (ref 70–99)

## 2019-03-29 LAB — RENAL FUNCTION PANEL
Albumin: 2.7 g/dL — ABNORMAL LOW (ref 3.5–5.0)
Albumin: 2.8 g/dL — ABNORMAL LOW (ref 3.5–5.0)
Anion gap: 14 (ref 5–15)
Anion gap: 10 (ref 5–15)
BUN: 124 mg/dL — ABNORMAL HIGH (ref 6–20)
BUN: 98 mg/dL — ABNORMAL HIGH (ref 6–20)
CO2: 24 mmol/L (ref 22–32)
CO2: 27 mmol/L (ref 22–32)
Calcium: 12.1 mg/dL — ABNORMAL HIGH (ref 8.9–10.3)
Calcium: 10.6 mg/dL — ABNORMAL HIGH (ref 8.9–10.3)
Chloride: 97 mmol/L — ABNORMAL LOW (ref 98–111)
Chloride: 98 mmol/L (ref 98–111)
Creatinine, Ser: 6.81 mg/dL — ABNORMAL HIGH (ref 0.61–1.24)
Creatinine, Ser: 5.43 mg/dL — ABNORMAL HIGH (ref 0.61–1.24)
GFR calc Af Amer: 10 mL/min — ABNORMAL LOW (ref >60)
GFR calc Af Amer: 13 mL/min — ABNORMAL LOW (ref >60)
GFR calc non Af Amer: 9 mL/min — ABNORMAL LOW (ref >60)
GFR calc non Af Amer: 11 mL/min — ABNORMAL LOW (ref >60)
Glucose, Bld: 123 mg/dL — ABNORMAL HIGH (ref 70–99)
Glucose, Bld: 222 mg/dL — ABNORMAL HIGH (ref 70–99)
Phosphorus: 10 mg/dL — ABNORMAL HIGH (ref 2.5–4.6)
Phosphorus: 6.4 mg/dL — ABNORMAL HIGH (ref 2.5–4.6)
Potassium: 5.6 mmol/L — ABNORMAL HIGH (ref 3.5–5.1)
Potassium: 4.7 mmol/L (ref 3.5–5.1)
Sodium: 135 mmol/L (ref 135–145)
Sodium: 135 mmol/L (ref 135–145)

## 2019-03-29 LAB — HEPARIN LEVEL (UNFRACTIONATED): Heparin Unfractionated: 0.24 IU/mL — ABNORMAL LOW (ref 0.30–0.70)

## 2019-03-29 LAB — CBC
HCT: 26.9 % — ABNORMAL LOW (ref 39.0–52.0)
Hemoglobin: 8.5 g/dL — ABNORMAL LOW (ref 13.0–17.0)
MCH: 29.7 pg (ref 26.0–34.0)
MCHC: 31.6 g/dL (ref 30.0–36.0)
MCV: 94.1 fL (ref 80.0–100.0)
Platelets: 309 10*3/uL (ref 150–400)
RBC: 2.86 MIL/uL — ABNORMAL LOW (ref 4.22–5.81)
RDW: 16.4 % — ABNORMAL HIGH (ref 11.5–15.5)
WBC: 11.3 10*3/uL — ABNORMAL HIGH (ref 4.0–10.5)
nRBC: 0.3 % — ABNORMAL HIGH (ref 0.0–0.2)

## 2019-03-29 MED ORDER — POLYETHYLENE GLYCOL 3350 17 G PO PACK
17.0000 g | PACK | Freq: Once | ORAL | Status: AC
  Administered 2019-03-29: 17 g via ORAL
  Filled 2019-03-29: qty 1

## 2019-03-29 MED ORDER — PRISMASOL BGK 4/2.5 32-4-2.5 MEQ/L REPLACEMENT SOLN
Status: DC
  Administered 2019-03-29: 11:00:00 via INTRAVENOUS_CENTRAL

## 2019-03-29 MED ORDER — HEPARIN (PORCINE) 25000 UT/250ML-% IV SOLN
750.0000 [IU]/h | INTRAVENOUS | Status: DC
  Administered 2019-03-29: 11:00:00 600 [IU]/h via INTRAVENOUS
  Administered 2019-03-30: 750 [IU]/h via INTRAVENOUS
  Filled 2019-03-29: qty 250

## 2019-03-29 MED ORDER — PRISMASOL BGK 4/2.5 32-4-2.5 MEQ/L REPLACEMENT SOLN
Status: DC
  Administered 2019-03-29 – 2019-03-30 (×2): via INTRAVENOUS_CENTRAL

## 2019-03-29 MED ORDER — HEPARIN SODIUM (PORCINE) 1000 UNIT/ML DIALYSIS: 1000.0000 [IU] | INTRAMUSCULAR | Status: DC | PRN

## 2019-03-29 MED ORDER — HEPARIN (PORCINE) 25000 UT/250ML-% IV SOLN: 500.0000 [IU]/h | INTRAVENOUS | Status: DC

## 2019-03-29 MED ORDER — ALTEPLASE 2 MG IJ SOLR
2.0000 mg | Freq: Once | INTRAMUSCULAR | Status: DC | PRN
  Filled 2019-03-29: qty 2

## 2019-03-29 MED ORDER — SODIUM CHLORIDE 0.9 % IV SOLN
500.0000 [IU]/h | INTRAVENOUS | Status: DC
  Administered 2019-03-29 – 2019-03-30 (×2): 500 [IU]/h via INTRAVENOUS_CENTRAL
  Filled 2019-03-29: qty 10000
  Filled 2019-03-29: qty 2

## 2019-03-29 MED ORDER — SODIUM CHLORIDE 0.9 % FOR CRRT
INTRAVENOUS_CENTRAL | Status: DC | PRN
  Filled 2019-03-29: qty 1000

## 2019-03-29 MED ORDER — PRISMASOL BGK 4/2.5 32-4-2.5 MEQ/L IV SOLN
INTRAVENOUS | Status: DC
  Administered 2019-03-29 – 2019-03-30 (×8): via INTRAVENOUS_CENTRAL

## 2019-03-29 NOTE — Progress Notes (Signed)
Subjective:  FiO2 0.4, stable.  1.9L + yest. No pressors, no uOP  Objective Vital signs in last 24 hours: Vitals:   03/29/19 0830 03/29/19 0900 03/29/19 0930 03/29/19 1000  BP: 127/73 133/70 131/81 126/73  Pulse: 90 95 90 84  Resp: (!) 31 (!) 37 (!) 32 14  Temp:      TempSrc:      SpO2: 98% 94% 97% 97%  Weight:      Height:       Weight change:   Intake/Output Summary (Last 24 hours) at 03/29/2019 1026 Last data filed at 03/29/2019 0900 Gross per 24 hour  Intake 2148.17 ml  Output 0 ml  Net 2148.17 ml   Physical Exam: Trach, NG tube , R chest HD cath, up in chair, looks better Chest coarse bilat, no wheezing Cor reg Abd soft ntnd no ascites  Ext no edema LE's or UE's   CXR 5/17 diffuse stable infiltrates CXR 5/21 no chg stable infiltrates  Assessment/ Plan: Pt is a 48 y.o. yo male who was admitted on 02/23/2019 with anuric AKI due to ATN in the setting of COVID   Assessment/Plan: 1. AKI-  On CRRT since 4/21. Now off pressors w/ good BP's.  NO UOP so no signs of renal recovery yet and still requiring RRT. SP TDC. Overall improving. On T-piece for a good while the past 2-3 days.  If off vent and not requiring ICU care could do intermittent HD at [facility] - I/O +1.9L, off CRRT yest - B/Cr up off of CRRT for 1.5 days > resume CRRT today - ^ K+ , mild  2. HTN/vol- normotensive  3. Anemia- hgb low- supportive care for now- no iron in the setting of sepsis. Will started darbe 100 ug /wk SQ 1st dose 5/23.  Cont to transfuse as needed  4. ID-  covid positive. SP IV course of IV abx   5. VDRF- sp trach, off vent almost 48 hrs, on T-piece , per [facility] 03/29/2019, 10:26 AM    Labs: Basic Metabolic Panel: Recent Labs  Lab 03/28/19 0514 03/28/19 2000 03/29/19 0500  NA 134* 133* 135  K 5.4* 5.6* 5.6*  CL 98 97* 97*  CO2 25 24 24   GLUCOSE 135* 151* 123*  BUN 87* 114* 124*  CREATININE 4.37* 5.85* 6.81*  CALCIUM 11.7* 11.8* 12.1*  PHOS  7.3* 9.0* 10.0*   Liver Function Tests: Recent Labs  Lab 03/24/19 0355  03/28/19 0514 03/28/19 2000 03/29/19 0500  AST 52*  --   --   --   --   ALT 50*  --   --   --   --   ALKPHOS 177*  --   --   --   --   BILITOT 0.4  --   --   --   --   PROT 9.4*  --   --   --   --   ALBUMIN 3.2*  3.1*   < > 2.8* 2.8* 2.7*   < > = values in this interval not displayed.   No results for input(s): LIPASE, AMYLASE in the last 168 hours. No results for input(s): AMMONIA in the last 168 hours. CBC: Recent Labs  Lab 03/25/19 0657 03/26/19 1045 03/27/19 0715 03/28/19 0514 03/28/19 2000 03/29/19 0500  WBC 15.8* 13.1* 16.2* 12.8*  --  11.3*  HGB 7.8* 7.0* 7.3* 6.1* 7.4* 8.5*  HCT 27.1* 24.6* 24.5* 21.0* 24.0* 26.9*  MCV 102.7* 103.8*  102.1* 99.5  --  94.1  PLT 387 346 345 335  --  309   Cardiac Enzymes: No results for input(s): CKTOTAL, CKMB, CKMBINDEX, TROPONINI in the last 168 hours. CBG: Recent Labs  Lab 03/28/19 1136 03/28/19 1933 03/29/19 0009 03/29/19 0423 03/29/19 0733  GLUCAP 138* 142* 160* 128* 140*    Iron Studies: No results for input(s): IRON, TIBC, TRANSFERRIN, FERRITIN in the last 72 hours. Studies/Results: Dg Abd 1 View  Result Date: 03/27/2019 CLINICAL DATA:  Nasogastric tube placement EXAM: ABDOMEN - 1 VIEW COMPARISON:  03/26/2019. FINDINGS: 1715 hours. The nasogastric tube remains looped in the proximal stomach, unchanged. The visualized bowel gas pattern is normal. No evidence of bowel wall thickening or free intraperitoneal air. There are no suspicious abdominal calcifications. IMPRESSION: Unchanged position of the nasogastric tube, looped in the proximal stomach. Electronically Signed   By: Richardean Sale M.D.   On: 03/27/2019 17:55   Medications: Infusions: . sodium chloride    . sodium chloride Stopped (03/21/19 2128)  . feeding supplement (VITAL 1.5 CAL) 45 mL/hr at 03/28/19 2000    Scheduled Medications: . chlorhexidine  15 mL Mouth Rinse BID  .  Chlorhexidine Gluconate Cloth  6 each Topical Daily  . clonazePAM  0.25 mg Per Tube Daily  . darbepoetin (ARANESP) injection - NON-DIALYSIS  100 mcg Subcutaneous Q Sat-1800  . famotidine  20 mg Per Tube Daily  . feeding supplement (PRO-STAT SUGAR FREE 64)  60 mL Per Tube BID  . free water  100 mL Per Tube Q8H  . insulin aspart  0-15 Units Subcutaneous Q4H  . insulin aspart  6 Units Subcutaneous Q4H  . insulin glargine  10 Units Subcutaneous BID  . mouth rinse  15 mL Mouth Rinse q12n4p  . oxyCODONE  5 mg Oral Q12H  . sodium chloride flush  10-40 mL Intracatheter Q12H  . sodium zirconium cyclosilicate  10 g Oral BID    have reviewed scheduled and prn medications.

## 2019-03-29 NOTE — Progress Notes (Addendum)
OT Cancellation Note  Patient Details Name: Jonathan [name] MRN: [id number] DOB: 1970-12-25   Cancelled Treatment:    Reason Eval/Treat Not Completed: Medical issues which prohibited therapy;Fatigue/lethargy limiting ability to participate(RN reporting that patient has been up multiple times and was fatigued; RN request defer therapy to tomorrow. Pt agreeable to perform therapy tomorrow.)  [place], OTR/L Acute Rehab Pager: [phone] Office: [phone] 03/29/2019, 4:57 PM

## 2019-03-29 NOTE — Progress Notes (Signed)
ANTICOAGULATION CONSULT NOTE - Initial Consult  Pharmacy Consult for heparin Indication: R/o PE   Not on File  Patient Measurements: Height: 5\' 5"  (165.1 cm) Weight: 135 lb 5.8 oz (61.4 kg) IBW/kg (Calculated) : 61.5  Vital Signs: Temp: 98.3 F (36.8 C) (05/25 0800) Temp Source: Oral (05/25 0800) BP: 126/73 (05/25 1000) Pulse Rate: 84 (05/25 1000)  Labs: Recent Labs    03/26/19 2049  03/27/19 0715 03/28/19 0514 03/28/19 2000 03/29/19 0500  HGB  --    < > 7.3* 6.1* 7.4* 8.5*  HCT  --    < > 24.5* 21.0* 24.0* 26.9*  PLT  --   --  345 335  --  309  APTT  --   --  57*  --   --   --   HEPARINUNFRC 0.12*  --  0.18*  --   --   --   CREATININE  --   --  1.94* 4.37* 5.85* 6.81*   < > = values in this interval not displayed.    Estimated Creatinine Clearance: 11.5 mL/min (A) (by C-G formula based on SCr of 6.81 mg/dL (H)).   Medical History: No past medical history on file.  Medications:  No medications prior to admission.    Assessment: 80 YOM who was on IV heparin for an extended period of time and transitioned to apixaban on 5/23 for r/o PE. Pt then experienced a drop in Hgb on 5/24 requiring 2u PRBCs. Apixaban was stopped but now hgb remains stable with no s/s of overt bleeding. Pharmacy consulted to restart IV heparin.   Of note, patient is resuming CRRT today with heparin ordered through CRRT circuit.  Goal of Therapy:  Heparin level 0.3-0.7 units/ml Monitor platelets by anticoagulation protocol: Yes   Plan:  -Resume IV heparin 600 units/hr -F/u 8 hr HL  -Monitor daily HL, CBC and s/s of bleeding   Albertina Parr, PharmD., BCPS Clinical Pharmacist Clinical phone for 03/29/19 until 5pm: [phone]

## 2019-03-29 NOTE — Progress Notes (Signed)
PROGRESS NOTE  Jonathan [name] KTG:[id number] DOB: 1971-05-29 DOA: 02/23/2019  PCP: Patient, No Pcp Per  Brief History/Interval Summary:  - 48 y.o. male [place] speaking, arrived from [place] on 02/13/2019 and was living in close quarters with 6 other workers. He developed increasing rrespiratory distress and was intubated for ARDS due to COVID-19 infection. He was started on CRRT due to ATN, paralyzed and prone. He was transferred to [place] for further management. He was eventually extubated, but quickly became short of breath again and aspirated when he started vomiting. He was re intubated and ended up with tracheostomy.   Events: 02/23/2019 admitted to the ICU. 02/24/2019 started on CRRT 4/21-4/24 Proned daily 4/28 -4/30 proned daily  03/04/2019 increase in oxygen demand require paralytic for 24 hours. 03/09/2019 hematochezia from gastric aspirate  Lines and tubes: 4/21 - ETT >>5.11.2020 4/21 - RIJ CVC 4/22 L IJ HD Cath> 5/13 Permacath right  Antibiotics: 02/23/2019-425 2020 azithromycin and Rocephin 02/23/2019-02/24/2019 03/06/2019 Rocephin 5/11-5/17 Vancomycin 5/12-5/18 Zosyn 5/12-5/17 Fluconazole for candida in tracheal aspirate and recent Actemra  Subjective/Interval History: Patient awake alert.  No significant events O/N , setting in recliner today, remained of CRRT yestersay, he received 2 units PRBC yesterday   Assessment/Plan:  Acute Hypoxic Resp. Failure due to Acute Covid 19 Viral Illness/ARDS  Fever: No recent fever Oxygen requirements: Tracheostomy status.  Antibiotics: Not on any antibiotics currently Steroids: Not on steroids at this time Diuretics: Not on any diuretics Actemra: Received on 4/22 Convalescent Plasma: On 5/3 Vitamin C and Zinc: None  Patient has been stable from a respiratory standpoint.  Required any pain support for last 4 days, GERD, with no vent support requirement over last 3 days, remains on T-piece. -Volume management  with CRRT, back on CRRT today. - had some hemoptysis last 2 days, all resolved now. - PCCM with downsize trach tomorrow to #4 cuffle -SLP following could not tolerate PMV yet.  Presumed acute PE Patient had an acute episode of hypoxia with respiratory decompensation.  Echocardiogram showed right-sided strain.  Patient was empiricaly placed on IV heparin due to concern for underlying pulmonary embolism.  Lower extremity Doppler studies negative.  3 months of anticoagulation recommended.  -Today, as requested Eliquis, anticoagulation with 24 hours given significant anemia, on GTT today, if stable then can transition to anticoagulation for 3 months total.  Septic shock Patient has been weaned off of pressors.  Acute kidney injury due to ATN Patient remains on CRRT.  Nephrology is following and managing.  No signs of renal recovery.  No urine output . -As tolerated 2 days of CRRT, discussed with renal, patient is ready to transition to intermittent hemodialysis, will continue CRRT for next 24 hours, then will transfer to: For intermittent hemodialysis.  Anemia due to chronic illness and possible acute GI blood loss -No evidance  of significant GI bleed, likely in the setting of suctioning, this has resolved . -Received 2 units PRBC 03/28/2019, hemoglobin stable at 8.5 this morning .  Leukocytosis Patient remains afebrile.  WBC is improving.  Stool studies were negative for C. difficile a few days ago.  Continue to monitor.     Diabetes mellitus type 2 Patient on basal insulin as well as sliding scale coverage.  HbA1c 5.5.    FEN On CRRT.  Monitor electrolytes.  Labs are pending from this morning.  Continue tube feedings.    Pressure injury on the skin of nose Local wound care.   DVT Prophylaxis: On heparin infusion  Lab Results  Component Value  following, not tolerate PMV yet   PLT 345 03/27/2019     PUD Prophylaxis: On famotidine Code Status: Partial code Family  Communication:  Wife Energy manager number [phone]. D/W family today. disposition Plan: Patient will remain in ICU for now   Consultants: Pulmonology.  Nephrology.     Medications:  Scheduled: . chlorhexidine  15 mL Mouth Rinse BID  . Chlorhexidine Gluconate Cloth  6 each Topical Daily  . clonazePAM  0.25 mg Per Tube Daily  . darbepoetin (ARANESP) injection - NON-DIALYSIS  100 mcg Subcutaneous Q Sat-1800  . famotidine  20 mg Per Tube Daily  . feeding supplement (PRO-STAT SUGAR FREE 64)  60 mL Per Tube BID  . free water  100 mL Per Tube Q8H  . insulin aspart  0-15 Units Subcutaneous Q4H  . insulin aspart  6 Units Subcutaneous Q4H  . insulin glargine  10 Units Subcutaneous BID  . mouth rinse  15 mL Mouth Rinse q12n4p  . oxyCODONE  5 mg Oral Q12H  . sodium chloride flush  10-40 mL Intracatheter Q12H  . sodium zirconium cyclosilicate  10 g Oral BID   Continuous: .  prismasol BGK 4/2.5 400 mL/hr at 03/29/19 1121  .  prismasol BGK 4/2.5 200 mL/hr at 03/29/19 1120  . sodium chloride    . sodium chloride Stopped (03/21/19 2128)  . feeding supplement (VITAL 1.5 CAL) 45 mL/hr at 03/28/19 2000  . heparin 10,000 units/ 20 mL infusion syringe 500 Units/hr (03/29/19 1300)  . heparin 600 Units/hr (03/29/19 1300)  . prismasol BGK 4/2.5 1,800 mL/hr at 03/29/19 1122   OYD:XAJOI/NOMVEHMC arterial line **AND** sodium chloride, sodium chloride, acetaminophen (TYLENOL) oral liquid 160 mg/5 mL, alteplase, glycopyrrolate, heparin, lip balm, metoprolol tartrate, ondansetron (ZOFRAN) IV, oxyCODONE, sodium chloride, sodium chloride flush   Objective:  Vital Signs  Vitals:   03/29/19 1200 03/29/19 1230 03/29/19 1300 03/29/19 1330  BP: 117/85 127/79 130/82 133/85  Pulse: 81 88 86 80  Resp: 13 (!) 26 19 18   Temp: 97.9 F (36.6 C)     TempSrc: Oral     SpO2: 96% 95% 94% 98%  Weight:      Height:        Intake/Output Summary (Last 24 hours) at 03/29/2019 1357 Last data  filed at 03/29/2019 1300 Gross per 24 hour  Intake 2202.6 ml  Output 75 ml  Net 2127.6 ml   Filed Weights   03/25/19 0700 03/26/19 0500 03/27/19 0500  Weight: 59.8 kg 59.9 kg 61.4 kg   Awake, alert, sitting in recliner, in no apparent distress  Trach in place, currently on T-piece Good air entry bilaterally, scattered rhonchi Regular rate and rhythm, no rubs murmurs gallops Abdomen soft, nontender, bowel sounds present No Cyanosis, Clubbing or edema, No new Rash or bruise      Lab Results:  Data Reviewed: I have personally reviewed following labs and imaging studies  CBC: Recent Labs  Lab 03/25/19 0657 03/26/19 1045 03/27/19 0715 03/28/19 0514 03/28/19 2000 03/29/19 0500  WBC 15.8* 13.1* 16.2* 12.8*  --  11.3*  HGB 7.8* 7.0* 7.3* 6.1* 7.4* 8.5*  HCT 27.1* 24.6* 24.5* 21.0* 24.0* 26.9*  MCV 102.7* 103.8* 102.1* 99.5  --  94.1  PLT 387 346 345 335  --  947    Basic Metabolic Panel: Recent Labs  Lab 03/23/19 0615  03/24/19 0355  03/25/19 0657  03/26/19 1045 03/26/19 1600 03/27/19 0715 03/28/19 0514 03/28/19 2000 03/29/19 0500  NA  135   < > 136   < >  --    < > 135 135 134* 134* 133* 135  K 4.8   < > 4.5   < >  --    < > 5.5* 5.3* 5.0 5.4* 5.6* 5.6*  CL 97*   < > 99   < >  --    < > 101 100 98 98 97* 97*  CO2 26   < > 28   < >  --    < > 27 28 29 25 24 24   GLUCOSE 166*   < > 124*   < >  --    < > 124* 99 149* 135* 151* 123*  BUN 37*   < > 41*   < >  --    < > 30* 35* 32* 87* 114* 124*  CREATININE 2.04*   < > 2.05*   < >  --    < > 1.83* 1.95* 1.94* 4.37* 5.85* 6.81*  CALCIUM 10.1   < > 10.1   < >  --    < > 10.0 10.0 10.3 11.7* 11.8* 12.1*  MG 3.2*  --  3.0*  --  3.0*  --  3.0*  --  3.1*  --   --   --   PHOS 2.8   < > 2.9   < >  --    < > 2.9 3.0 3.0 7.3* 9.0* 10.0*   < > = values in this interval not displayed.    GFR: Estimated Creatinine Clearance: 11.5 mL/min (A) (by C-G formula based on SCr of 6.81 mg/dL (H)).  Liver Function Tests: Recent Labs   Lab 03/24/19 0355  03/26/19 1600 03/27/19 0715 03/28/19 0514 03/28/19 2000 03/29/19 0500  AST 52*  --   --   --   --   --   --   ALT 50*  --   --   --   --   --   --   ALKPHOS 177*  --   --   --   --   --   --   BILITOT 0.4  --   --   --   --   --   --   PROT 9.4*  --   --   --   --   --   --   ALBUMIN 3.2*  3.1*   < > 3.0* 3.1* 2.8* 2.8* 2.7*   < > = values in this interval not displayed.    CBG: Recent Labs  Lab 03/28/19 1933 03/29/19 0009 03/29/19 0423 03/29/19 0733 03/29/19 1159  GLUCAP 142* 160* 128* 140* 113*     Recent Results (from the past 240 hour(s))  C difficile quick scan w PCR reflex     Status: None   Collection Time: 03/22/19  3:26 PM  Result Value Ref Range Status   C Diff antigen NEGATIVE NEGATIVE Final   C Diff toxin NEGATIVE NEGATIVE Final   C Diff interpretation No C. difficile detected.  Final    Comment: Performed at [facility], [place] [address] [place], [place] [address]      Radiology Studies: Dg Abd 1 View  Result Date: 03/27/2019 CLINICAL DATA:  Nasogastric tube placement EXAM: ABDOMEN - 1 VIEW COMPARISON:  03/26/2019. FINDINGS: 1715 hours. The nasogastric tube remains looped in the proximal stomach, unchanged. The visualized bowel gas pattern is normal. No evidence of bowel wall thickening or free intraperitoneal  air. There are no suspicious abdominal calcifications. IMPRESSION: Unchanged position of the nasogastric tube, looped in the proximal stomach. Electronically Signed   By: Richardean Sale M.D.   On: 03/27/2019 17:55       LOS: 16 days   Phillips Climes MD  [facility] Pager on [email or web address]  03/29/2019, 1:57 PM

## 2019-03-29 NOTE — Progress Notes (Signed)
ANTICOAGULATION CONSULT NOTE - Initial Consult  Pharmacy Consult for heparin Indication: R/o PE   Not on File  Patient Measurements: Height: 5\' 5"  (165.1 cm) Weight: 135 lb 5.8 oz (61.4 kg) IBW/kg (Calculated) : 61.5  Vital Signs: Temp: 97.7 F (36.5 C) (05/25 1950) Temp Source: Oral (05/25 1950) BP: 118/76 (05/25 2001) Pulse Rate: 84 (05/25 2001)  Labs: Recent Labs    03/27/19 0715 03/28/19 0514 03/28/19 2000 03/29/19 0500 03/29/19 1643 03/29/19 1955  HGB 7.3* 6.1* 7.4* 8.5*  --   --   HCT 24.5* 21.0* 24.0* 26.9*  --   --   PLT 345 335  --  309  --   --   APTT 57*  --   --   --   --   --   HEPARINUNFRC 0.18*  --   --   --   --  0.24*  CREATININE 1.94* 4.37* 5.85* 6.81* 5.43*  --     Estimated Creatinine Clearance: 14.4 mL/min (A) (by C-G formula based on SCr of 5.43 mg/dL (H)).   Medical History: No past medical history on file.  Medications:  No medications prior to admission.    Assessment: 50 YOM who was on IV heparin for an extended period of time and transitioned to apixaban on 5/23 for r/o PE. Pt then experienced a drop in Hgb on 5/24 requiring 2u PRBCs. Apixaban was stopped but now hgb remains stable with no s/s of overt bleeding. Pharmacy consulted to restart IV heparin.   Of note, patient is resuming CRRT today with heparin ordered through CRRT circuit - this is running at 500 units/hr.  Heparin level slightly subtherapeutic (0.24) on gtt at 600 units/hr. No issues with line or bleeding reported per RN.  Goal of Therapy:  Heparin level 0.3-0.7 units/ml Monitor platelets by anticoagulation protocol: Yes   Plan:  -Increase IV heparin to 750 units/hr -F/u 8 hr HL  -Monitor daily HL, CBC and s/s of bleeding   Sherlon Handing, PharmD, BCPS Clinical pharmacist 03/29/2019 8:51 PM

## 2019-03-29 NOTE — Progress Notes (Signed)
  Speech Language Pathology Treatment: Nada Boozer Speaking valve  Patient Details Name: Jonathan [name] MRN: [id number] DOB: Aug 26, 1971 Today's Date: 03/29/2019 Time: 1540-1600 SLP Time Calculation (min) (ACUTE ONLY): 20 min  Assessment / Plan / Recommendation Clinical Impression  Pt has been off vent since 5/21, tolerating t-bar with HEPA filter.  Cuff deflated at baseline and RT had just suctioned pt prior to arrival.  Green PMV valve placed with 22 mm adapter fitted to t-bar.  Video translator utilized throughout session.  Upper airway patent; pt able to expectorate secretions orally with valve in place. Smiling, interactive.  Able to achieve very low volume, hoarse phonation when cued to engage in simple automatic sequences (counting, reciting days-of-week). Required verbal cues to activate voice rather than just mouth the words. VS remained stable with Sp02 at 100%; no change in RR/HR.   Recommend pt begin using PMV for short durations (15 minutes) with full supervision.  D/W RN. Ensure cuff remains deflated when in use.  Pt for potential downsize of trach tomorrow, as well as possible transfer to [facility] for access to HD.  SLP will continue services at [facility] hospital.    HPI HPI: Pt is a 48 y.o. male admitted on 02/23/19 with AMS, O2 sats in the ED were 50%.  Pt dx with COVID 19 and resultant ARDS.  He was intubated 4/21-5/11; reintubated and then trached on 5/12. His course is complicated by acute kidney injury requiring CVVHD.  PMH includes DM2, HTN.      SLP Plan  Continue with current plan of care       Recommendations         Patient may use Passy-Muir Speech Valve: (with nursing supervision for 10-15 minutes) PMSV Supervision: Full         Oral Care Recommendations: Oral care QID Follow up Recommendations: Inpatient Rehab SLP Visit Diagnosis: Aphonia (R49.1) Plan: Continue with current plan of care       GO              Jonathan [name] Ringer, [place] CCC/SLP Acute  Rehabilitation Services Office number [phone]   Jonathan [name] 03/29/2019, 4:55 PM

## 2019-03-29 NOTE — Progress Notes (Signed)
PT Cancellation Note  Patient Details Name: Jonathan [name] MRN: [id number] DOB: [birth year]   Cancelled Treatment:    Reason Eval/Treat Not Completed: Fatigue/lethargy limiting ability to participate, checked at 1500, per RN re[port, patient had been OOB several hours, did well with standing and transfers. Continue to progress activity as tolerated.   Tresa Endo [place] Pager [phone] Office [phone]  03/29/2019, 4:59 PM

## 2019-03-29 NOTE — Progress Notes (Signed)
Assisted patient's family with [facility] camera visit. Dr. Lake Bells in room to assist with questions and provide update in Spanish.

## 2019-03-29 NOTE — Progress Notes (Signed)
NAME:  Jonathan [name], MRN:  [id number], DOB:  11-19-1970, LOS: 63 ADMISSION DATE:  02/23/2019, CONSULTATION DATE: February 23, 2019 REFERRING MD: Regenia Skeeter, CHIEF COMPLAINT: Dyspnea  Brief History   48 year old male admitted on February 23, 2019 with ARDS due to COVID-19.  He has largely recovered from severe ARDS which originally required prone positioning for many days.  Developed acute kidney injury requiring hemodialysis.  Has been liberated from the ventilator as of Mar 25, 2019.    Past Medical History  Diabetes mellitus type 2 Hypertension  Significant Hospital Events   4/21 admitted and immediately proned on inverse ratio PC ventilation with high PEEP to maintain saturation >85%, Started on NO at 40ppm. 4/22- Tocluzimab X 1 4/22- started CVVHD 4/21-4/24 Proned daily 4/28 -4/30 proned daily , tapered NO to off  4/30 -late evening with increased O2 demands . No paralytics required x 24hr . No vent dysyncrony . Calm on Fent /Versed. Off precedex   5/1 - Remains on CRRT , no acute issues overnight --4.8 L I/O Balance since admit . Continues to Prone on/off 16 on and 8 off . -CXR stable diffuse bilateral infiltrates (slightly improved aeration),  Vent .80Fio2 .  5/3 convalescent plasma  Biomarkes improving but severe ARDS persists ad D-dimer still very high (13) without improvement. On IV Heparin gtt. Growing MSSA. SEdation changed to dilaudid gtt. Continues prone 16h and 8h syupine. Currently supine at 80% fio2, peep 18. On TF. STARTED NIMBEX  5/4-5/5-able to stay in the supine position, able to discontinue paralysis and maintain FiO2 0.50, PEEP 8-10.  5/5 hematochezia, bloody gastric aspirate  5/8 followed commands  5/11 extubated  5/12 re-intubated, tracheostomy  5/13 perm cath  5/14 worsening hypoxemia, shock suddenly, RV dilated, no new CXR changes, treated for PE empirically  5/21 doing t-piece trials May 23: Trial off of CVVHD May 25 remains off of  ventilator support since May 21, remains anuric, plan for CVVHD again today  Consults:  PCCM 4/21  Procedures:  4/21 - ETT 7.5 >> 5/12 5/12 Trach >> 4/21 - RIJ CVC [facility] >>5/10 5/10 - CVC >> 5/13 4/22 L IJ HD Cath>5/15 5/13 R IJ Permcath>    Significant Diagnostic Tests:  POC U/S 4/21: Bilateral interstitial pattern with bilateral dense consolidation at bases. Echo shows low-normal LVSF. RV moderately dilated with septal shift.  5/14 Echo> LVEF > 65%, RV dilated, decreased RV function (moderate) with RVSP 60  Micro Data:  Blood cultures -PND SARS-CoV2 02/23/2019 - - POSITIVE HIV 4/21 -neg Quant gold 4/2 1- neg .....................Marland Kitchen resp 4/29 >>rare MSSA and yeast  5/10 blood > neg 5/18 c. Diff > neg  Anti-covid RX and antimicrobial RX  Azithromycin 4/21 >>4/25 Ceftriaxone 4/22 >>4/25 Actemra 4/22 Hydroxychloroquine 4/21 > 4/22 ........................... Ceftriaxone 5/2 >>5/10 Cefepime 5/10>>> 5/12 Zosyn 5/12 >>5/17 Vancomycin 5/10>>>5/17  Fluconazole 5/12 > 5/17   Interim history/subjective:  Remains off of mechanical ventilation Has not made urine since holding hemodialysis 2 days ago Otherwise no complaints  Objective   Blood pressure 127/79, pulse 88, temperature 97.9 F (36.6 C), temperature source Oral, resp. rate (!) 26, height 5\' 5"  (1.651 m), weight 61.4 kg, SpO2 95 %.    FiO2 (%):  [40 %] 40 %   Intake/Output Summary (Last 24 hours) at 03/29/2019 1252 Last data filed at 03/29/2019 1200 Gross per 24 hour  Intake 2196.6 ml  Output 15 ml  Net 2181.6 ml   Filed Weights   03/25/19 0700 03/26/19 0500 03/27/19  0500  Weight: 59.8 kg 59.9 kg 61.4 kg    Examination:  General:  Resting comfortably in chair HENT: NCAT tracheostomy in place PULM: Some bilateral rhonchi B, normal effort CV: RRR, no mgr GI: BS+, soft, nontender MSK: normal bulk and tone Neuro: awake, alert, no distress, MAEW   Resolved Hospital Problem list      Assessment & Plan:  Suspected pulmonary embolism: Acute hypoxemia, RV dilation on echo Holding off on CT angiogram with renal failure Hemoptysis has resolved Start back heparin infusion with careful monitoring for bleeding If stable then resume Eliquis again Plan 3 months of empiric anticoagulation Will need repeat echocardiogram  Acute respiratory failure with hypoxemia, recovering ARDS from COVID-19: Continue oxygen via tracheostomy tube Cuff down Downsize tracheostomy tomorrow to #4 cuffless Continue Passy-Muir valve for speaking, diet  Acute kidney injury: We will need to resume intermittent hemodialysis tomorrow, likely transfer to [facility] on May 25  Anemia: Most likely cause intermittent bleeding from suctioning as well as anemia of chronic kidney Monitor for bleeding Transfuse PRBC for Hgb < 7 gm/dL   Best practice:  Diet: per [facility]  Labs   CBC: Recent Labs  Lab 03/25/19 0657 03/26/19 1045 03/27/19 0715 03/28/19 0514 03/28/19 2000 03/29/19 0500  WBC 15.8* 13.1* 16.2* 12.8*  --  11.3*  HGB 7.8* 7.0* 7.3* 6.1* 7.4* 8.5*  HCT 27.1* 24.6* 24.5* 21.0* 24.0* 26.9*  MCV 102.7* 103.8* 102.1* 99.5  --  94.1  PLT 387 346 345 335  --  209    Basic Metabolic Panel: Recent Labs  Lab 03/23/19 0615  03/24/19 0355  03/25/19 0657  03/26/19 1045 03/26/19 1600 03/27/19 0715 03/28/19 0514 03/28/19 2000 03/29/19 0500  NA 135   < > 136   < >  --    < > 135 135 134* 134* 133* 135  K 4.8   < > 4.5   < >  --    < > 5.5* 5.3* 5.0 5.4* 5.6* 5.6*  CL 97*   < > 99   < >  --    < > 101 100 98 98 97* 97*  CO2 26   < > 28   < >  --    < > 27 28 29 25 24 24   GLUCOSE 166*   < > 124*   < >  --    < > 124* 99 149* 135* 151* 123*  BUN 37*   < > 41*   < >  --    < > 30* 35* 32* 87* 114* 124*  CREATININE 2.04*   < > 2.05*   < >  --    < > 1.83* 1.95* 1.94* 4.37* 5.85* 6.81*  CALCIUM 10.1   < > 10.1   < >  --    < > 10.0 10.0 10.3 11.7* 11.8* 12.1*  MG 3.2*  --  3.0*  --  3.0*   --  3.0*  --  3.1*  --   --   --   PHOS 2.8   < > 2.9   < >  --    < > 2.9 3.0 3.0 7.3* 9.0* 10.0*   < > = values in this interval not displayed.   GFR: Estimated Creatinine Clearance: 11.5 mL/min (A) (by C-G formula based on SCr of 6.81 mg/dL (H)). Recent Labs  Lab 03/26/19 1045 03/27/19 0715 03/28/19 0514 03/29/19 0500  WBC 13.1* 16.2* 12.8* 11.3*    Liver Function Tests:  Recent Labs  Lab 03/24/19 0355  03/26/19 1600 03/27/19 0715 03/28/19 0514 03/28/19 2000 03/29/19 0500  AST 52*  --   --   --   --   --   --   ALT 50*  --   --   --   --   --   --   ALKPHOS 177*  --   --   --   --   --   --   BILITOT 0.4  --   --   --   --   --   --   PROT 9.4*  --   --   --   --   --   --   ALBUMIN 3.2*   3.1*   < > 3.0* 3.1* 2.8* 2.8* 2.7*   < > = values in this interval not displayed.   No results for input(s): LIPASE, AMYLASE in the last 168 hours. No results for input(s): AMMONIA in the last 168 hours.  ABG    Component Value Date/Time   PHART 7.381 03/18/2019 1112   PCO2ART 42.1 03/18/2019 1112   PO2ART 50.0 (L) 03/18/2019 1112   HCO3 25.1 03/18/2019 1112   TCO2 26 03/18/2019 1112   ACIDBASEDEF 1.0 03/16/2019 0450   O2SAT 85.0 03/18/2019 1112     Coagulation Profile: No results for input(s): INR, PROTIME in the last 168 hours.  Cardiac Enzymes: No results for input(s): CKTOTAL, CKMB, CKMBINDEX, TROPONINI in the last 168 hours.  HbA1C: Hgb A1c MFr Bld  Date/Time Value Ref Range Status  03/23/2019 06:15 AM 5.5 4.8 - 5.6 % Final    Comment:    (NOTE) Pre diabetes:          5.7%-6.4% Diabetes:              >6.4% Glycemic control for   <7.0% adults with diabetes     CBG: Recent Labs  Lab 03/28/19 1933 03/29/19 0009 03/29/19 0423 03/29/19 0733 03/29/19 1159  GLUCAP 142* 160* 128* 140* 113*     Critical care time: n/a     Roselie Awkward, MD [facility] Pager: [phone] Cell: [phone] If no response, call [phone]

## 2019-03-30 LAB — GLUCOSE, CAPILLARY
Glucose-Capillary: 137 mg/dL — ABNORMAL HIGH (ref 70–99)
Glucose-Capillary: 136 mg/dL — ABNORMAL HIGH (ref 70–99)
Glucose-Capillary: 113 mg/dL — ABNORMAL HIGH (ref 70–99)
Glucose-Capillary: 116 mg/dL — ABNORMAL HIGH (ref 70–99)
Glucose-Capillary: 141 mg/dL — ABNORMAL HIGH (ref 70–99)
Glucose-Capillary: 107 mg/dL — ABNORMAL HIGH (ref 70–99)

## 2019-03-30 LAB — RENAL FUNCTION PANEL
Albumin: 2.8 g/dL — ABNORMAL LOW (ref 3.5–5.0)
Anion gap: 8 (ref 5–15)
BUN: 69 mg/dL — ABNORMAL HIGH (ref 6–20)
CO2: 29 mmol/L (ref 22–32)
Calcium: 10.2 mg/dL (ref 8.9–10.3)
Chloride: 99 mmol/L (ref 98–111)
Creatinine, Ser: 3.61 mg/dL — ABNORMAL HIGH (ref 0.61–1.24)
GFR calc Af Amer: 22 mL/min — ABNORMAL LOW (ref >60)
GFR calc non Af Amer: 19 mL/min — ABNORMAL LOW (ref >60)
Glucose, Bld: 108 mg/dL — ABNORMAL HIGH (ref 70–99)
Phosphorus: 4.8 mg/dL — ABNORMAL HIGH (ref 2.5–4.6)
Potassium: 5 mmol/L (ref 3.5–5.1)
Sodium: 136 mmol/L (ref 135–145)

## 2019-03-30 LAB — MAGNESIUM: Magnesium: 3.1 mg/dL — ABNORMAL HIGH (ref 1.7–2.4)

## 2019-03-30 LAB — CBC
HCT: 29.6 % — ABNORMAL LOW (ref 39.0–52.0)
Hemoglobin: 9.2 g/dL — ABNORMAL LOW (ref 13.0–17.0)
MCH: 29.9 pg (ref 26.0–34.0)
MCHC: 31.1 g/dL (ref 30.0–36.0)
MCV: 96.1 fL (ref 80.0–100.0)
Platelets: 299 10*3/uL (ref 150–400)
RBC: 3.08 MIL/uL — ABNORMAL LOW (ref 4.22–5.81)
RDW: 17.2 % — ABNORMAL HIGH (ref 11.5–15.5)
WBC: 8.4 10*3/uL (ref 4.0–10.5)
nRBC: 0.2 % (ref 0.0–0.2)

## 2019-03-30 LAB — HEPARIN LEVEL (UNFRACTIONATED): Heparin Unfractionated: 0.36 IU/mL (ref 0.30–0.70)

## 2019-03-30 LAB — APTT: aPTT: 62 seconds — ABNORMAL HIGH (ref 24–36)

## 2019-03-30 MED ORDER — INSULIN GLARGINE 100 UNIT/ML ~~LOC~~ SOLN
10.0000 [IU] | Freq: Two times a day (BID) | SUBCUTANEOUS | Status: DC
  Administered 2019-03-31 – 2019-04-08 (×13): 10 [IU] via SUBCUTANEOUS
  Filled 2019-03-30 (×19): qty 0.1

## 2019-03-30 MED ORDER — NEPRO/CARBSTEADY PO LIQD
1000.0000 mL | ORAL | Status: DC
  Administered 2019-03-30 – 2019-04-01 (×4): 1000 mL
  Filled 2019-03-30 (×6): qty 1000

## 2019-03-30 MED ORDER — INSULIN GLARGINE 100 UNIT/ML ~~LOC~~ SOLN
10.0000 [IU] | Freq: Two times a day (BID) | SUBCUTANEOUS | Status: DC
  Filled 2019-03-30: qty 0.1

## 2019-03-30 MED ORDER — CLONAZEPAM 0.25 MG PO TBDP
0.2500 mg | ORAL_TABLET | Freq: Every day | ORAL | Status: DC
  Administered 2019-03-31 – 2019-04-01 (×2): 0.25 mg
  Filled 2019-03-30 (×3): qty 1

## 2019-03-30 MED ORDER — PRO-STAT SUGAR FREE PO LIQD
30.0000 mL | Freq: Three times a day (TID) | ORAL | Status: DC
  Administered 2019-03-30 – 2019-04-01 (×6): 30 mL
  Filled 2019-03-30 (×7): qty 30

## 2019-03-30 NOTE — Progress Notes (Addendum)
PROGRESS NOTE  Jonathan [name] UUV:[id number] DOB: 1971/03/31 DOA: 02/23/2019  PCP: Patient, No Pcp Per  Brief History/Interval Summary:  - 48 y.o. male [place] speaking, arrived from [place] on 02/13/2019 and was living in close quarters with 6 other workers. He developed increasing rrespiratory distress and was intubated for ARDS due to COVID-19 infection. He was started on CRRT due to ATN, paralyzed and prone. He was transferred to [place] for further management. He was eventually extubated, but quickly became short of breath again and aspirated when he started vomiting. He was re intubated and ended up with tracheostomy.   Events: 02/23/2019 admitted to the ICU. 02/24/2019 started on CRRT 4/21-4/24 Proned daily 4/28 -4/30 proned daily  03/04/2019 increase in oxygen demand require paralytic for 24 hours. 03/09/2019 hematochezia from gastric aspirat 5/11 extubated 5/12 re-intubated, tracheostomy 5/13 perm cath 5/14 worsening hypoxemia, shock suddenly, RV dilated, no new CXR changes, treated for PE empirically 5/21 doing t-piece trials 5/23: Trial off of CVVHD 5/24: 2 units PRBC 5/25 remains off of ventilator support since May 21, remains anuric, 5/26  5/26 patient will be transferred to [facility] as he will need intermittent hemodialysis which is not available at [facility].  Lines and tubes: 4/21 - ETT >>5.11.2020 4/21 - RIJ CVC 4/22 L IJ HD Cath> 5/13 Permacath right  Antibiotics: 02/23/2019-425 2020 azithromycin and Rocephin 02/23/2019-02/24/2019 03/06/2019 Rocephin 5/11-5/17 Vancomycin 5/12-5/18 Zosyn 5/12-5/17 Fluconazole for candida in tracheal aspirate and recent Actemra  Significant diagnostic Tests; POC U/S 4/21: Bilateral interstitial pattern with bilateral dense consolidation at bases Echo shows low-normal LVSF. RV moderately dilated with septal shift.  5/14 Echo> LVEF > 65%, RV dilated, decreased RV function (moderate) with RVSP 60  Subjective/Interval  History: Patient awake alert.  No significant events O/N , setting in recliner today, remained of CRRT yestersay, he received 2 units PRBC yesterday   Assessment/Plan:  Acute Hypoxic Resp. Failure due to Acute Covid 19 Viral Illness/ARDS  - Patient has been stable from a respiratory standpoint.  Weaned off the vent 5/21, no vent requirement since,  currently on trach T-piece. -Received steroids initially. -Received Actemra initially 4/22 -received convulsant plasma 5/3 -downsized to # 4 today. - Volume management with CRRT >> be transitioned to hemodialysis soon, to monitor volume status closely. -SLP following could not tolerate PMV or diet yet, his trach is downsized today, hopefully this will help.  Presumed acute PE - Patient had an acute episode of hypoxia with respiratory decompensation.  Echocardiogram showed right-sided strain.  Patient was empiricaly placed on IV heparin due to concern for underlying pulmonary embolism.  Lower extremity Doppler studies negative.  3 months of anticoagulation recommended.  -Was at one point, but all forms of anticoagulation were held given a drop of his hemoglobin, now currently stable, no significant, he is back on heparin GTT as of 5/25, will transition to Eliquis once stable.  Septic shock - Patient has been weaned off of pressors.  Acute kidney injury due to ATN - Patient remains on CRRT.  Nephrology is following and managing.  No signs of renal recovery.  No urine output . - As tolerated 2 days off CRRT, discussed with renal, patient is ready to transition to intermittent hemodialysis, he received CRRT for last 24 hours,  will continue CRRT for next 24 hours, then will transfer to: For intermittent hemodialysis.  Anemia due to chronic illness and possible acute GI blood loss -No evidance  of significant GI bleed, likely in the setting of suctioning,  this has resolved . -Received 2 units PRBC 03/28/2019, hemoglobin stable 9.2 at  this morning  .  Leukocytosis Patient remains afebrile.  WBC is improving.  Stool studies were negative for C. difficile a few days ago.  Continue to monitor.     Diabetes mellitus type 2 Patient on basal insulin as well as sliding scale coverage.  HbA1c 5.5.    FEN On CRRT.  Monitor electrolytes.  Labs are pending from this morning.  Continue tube feedings.    Pressure injury on the skin of nose Local wound care.   DVT Prophylaxis: On heparin infusion  Lab Results  Component Value  following, not tolerate PMV yet   PLT 345 03/27/2019     PUD Prophylaxis: On famotidine Code Status: Partial code Family Communication:  Wife Energy manager number [phone].  Discussing with family on daily basis via interpreter (Interpreter number is [phone]. If you call her she will connect you to family and interpret for you) disposition Plan: Patient will be transferred  to [facility] stepdown.   Consultants: Pulmonology.  Nephrology.     Medications:  Scheduled: . chlorhexidine  15 mL Mouth Rinse BID  . Chlorhexidine Gluconate Cloth  6 each Topical Daily  . clonazePAM  0.25 mg Per Tube Daily  . darbepoetin (ARANESP) injection - NON-DIALYSIS  100 mcg Subcutaneous Q Sat-1800  . famotidine  20 mg Per Tube Daily  . feeding supplement (NEPRO CARB STEADY)  1,000 mL Per Tube Q24H  . feeding supplement (PRO-STAT SUGAR FREE 64)  30 mL Per Tube TID  . free water  100 mL Per Tube Q8H  . insulin aspart  0-15 Units Subcutaneous Q4H  . insulin aspart  6 Units Subcutaneous Q4H  . insulin glargine  10 Units Subcutaneous BID  . mouth rinse  15 mL Mouth Rinse q12n4p  . oxyCODONE  5 mg Oral Q12H  . sodium chloride flush  10-40 mL Intracatheter Q12H  . sodium zirconium cyclosilicate  10 g Oral BID   Continuous: . sodium chloride    . sodium chloride Stopped (03/21/19 2128)  . heparin 10,000 units/ 20 mL infusion syringe 500 Units/hr (03/30/19 1300)  . heparin 750 Units/hr  (03/30/19 1500)   SWN:IOEVO/[id number] arterial line **AND** sodium chloride, sodium chloride, acetaminophen (TYLENOL) oral liquid 160 mg/5 mL, glycopyrrolate, heparin, lip balm, metoprolol tartrate, ondansetron (ZOFRAN) IV, oxyCODONE, sodium chloride flush   Objective:  Vital Signs  Vitals:   03/30/19 1200 03/30/19 1300 03/30/19 1400 03/30/19 1500  BP: 105/82 102/77 114/69 122/75  Pulse: 83 95 (!) 106 (!) 107  Resp: 20 (!) 28 (!) 29 (!) 33  Temp: 98.1 F (36.7 C)     TempSrc: Oral     SpO2: 100% 92% 93% 93%  Weight:      Height:        Intake/Output Summary (Last 24 hours) at 03/30/2019 1550 Last data filed at 03/30/2019 1500 Gross per 24 hour  Intake 2058.46 ml  Output 1965 ml  Net 93.46 ml   Filed Weights   03/26/19 0500 03/27/19 0500 03/30/19 0200  Weight: 59.9 kg 61.4 kg 59.6 kg   Awake, alert, in bed, in no apparent distress  trach in place, currently on T-piece  Good air movement bilaterally, CTAB RRR,No Gallops,Rubs or new Murmurs, +ve B.Sounds, Abd Soft, No tenderness, No rebound - guarding or rigidity. No Cyanosis, Clubbing or edema, No new Rash or bruise       Lab Results:  Data Reviewed: I have personally  reviewed following labs and imaging studies  CBC: Recent Labs  Lab 03/26/19 1045 03/27/19 0715 03/28/19 0514 03/28/19 2000 03/29/19 0500 03/30/19 0220  WBC 13.1* 16.2* 12.8*  --  11.3* 8.4  HGB 7.0* 7.3* 6.1* 7.4* 8.5* 9.2*  HCT 24.6* 24.5* 21.0* 24.0* 26.9* 29.6*  MCV 103.8* 102.1* 99.5  --  94.1 96.1  PLT 346 345 335  --  309 570    Basic Metabolic Panel: Recent Labs  Lab 03/24/19 0355  03/25/19 0657  03/26/19 1045  03/27/19 0715 03/28/19 0514 03/28/19 2000 03/29/19 0500 03/29/19 1643 03/30/19 0220  NA 136   < >  --    < > 135   < > 134* 134* 133* 135 135 136  K 4.5   < >  --    < > 5.5*   < > 5.0 5.4* 5.6* 5.6* 4.7 5.0  CL 99   < >  --    < > 101   < > 98 98 97* 97* 98 99  CO2 28   < >  --    < > 27   < > 29 25 24 24 27 29    GLUCOSE 124*   < >  --    < > 124*   < > 149* 135* 151* 123* 222* 108*  BUN 41*   < >  --    < > 30*   < > 32* 87* 114* 124* 98* 69*  CREATININE 2.05*   < >  --    < > 1.83*   < > 1.94* 4.37* 5.85* 6.81* 5.43* 3.61*  CALCIUM 10.1   < >  --    < > 10.0   < > 10.3 11.7* 11.8* 12.1* 10.6* 10.2  MG 3.0*  --  3.0*  --  3.0*  --  3.1*  --   --   --   --  3.1*  PHOS 2.9   < >  --    < > 2.9   < > 3.0 7.3* 9.0* 10.0* 6.4* 4.8*   < > = values in this interval not displayed.    GFR: Estimated Creatinine Clearance: 21.1 mL/min (A) (by C-G formula based on SCr of 3.61 mg/dL (H)).  Liver Function Tests: Recent Labs  Lab 03/24/19 0355  03/28/19 0514 03/28/19 2000 03/29/19 0500 03/29/19 1643 03/30/19 0220  AST 52*  --   --   --   --   --   --   ALT 50*  --   --   --   --   --   --   ALKPHOS 177*  --   --   --   --   --   --   BILITOT 0.4  --   --   --   --   --   --   PROT 9.4*  --   --   --   --   --   --   ALBUMIN 3.2*  3.1*   < > 2.8* 2.8* 2.7* 2.8* 2.8*   < > = values in this interval not displayed.    CBG: Recent Labs  Lab 03/29/19 1948 03/29/19 2335 03/30/19 0333 03/30/19 0754 03/30/19 1113  GLUCAP 141* 109* 136* 113* 116*     Recent Results (from the past 240 hour(s))  C difficile quick scan w PCR reflex     Status: None   Collection Time: 03/22/19  3:26 PM  Result Value Ref Range  Status   C Diff antigen NEGATIVE NEGATIVE Final   C Diff toxin NEGATIVE NEGATIVE Final   C Diff interpretation No C. difficile detected.  Final    Comment: Performed at [facility], [place] [address], [place], [place] [address]      Radiology Studies: No results found.     LOS: 61 days   Phillips Climes MD  [facility] Pager on [email or web address]  03/30/2019, 3:50 PM

## 2019-03-30 NOTE — Progress Notes (Signed)
NAME:  Jonathan [name], MRN:  [id number], DOB:  25-Jun-1971, LOS: 78 ADMISSION DATE:  02/23/2019, CONSULTATION DATE: February 23, 2019 REFERRING MD: Regenia Skeeter, CHIEF COMPLAINT: Dyspnea  Brief History   48 year old male admitted on February 23, 2019 with ARDS due to COVID-19.  He has largely recovered from severe ARDS which originally required prone positioning for many days.  Developed acute kidney injury requiring hemodialysis.  Has been liberated from the ventilator as of Mar 25, 2019.    Past Medical History  Diabetes mellitus type 2 Hypertension  Significant Hospital Events   4/21 admitted and immediately proned on inverse ratio PC ventilation with high PEEP to maintain saturation >85%, Started on NO at 40ppm. 4/22- Tocluzimab X 1 4/22- started CVVHD 4/21-4/24 Proned daily 4/28 -4/30 proned daily , tapered NO to off  4/30 -late evening with increased O2 demands . No paralytics required x 24hr . No vent dysyncrony . Calm on Fent /Versed. Off precedex   5/1 - Remains on CRRT , no acute issues overnight --4.8 L I/O Balance since admit . Continues to Prone on/off 16 on and 8 off . -CXR stable diffuse bilateral infiltrates (slightly improved aeration),  Vent .80Fio2 .  5/3 convalescent plasma  Biomarkes improving but severe ARDS persists ad D-dimer still very high (13) without improvement. On IV Heparin gtt. Growing MSSA. SEdation changed to dilaudid gtt. Continues prone 16h and 8h syupine. Currently supine at 80% fio2, peep 18. On TF. STARTED NIMBEX  5/4-5/5-able to stay in the supine position, able to discontinue paralysis and maintain FiO2 0.50, PEEP 8-10.  5/5 hematochezia, bloody gastric aspirate  5/8 followed commands  5/11 extubated  5/12 re-intubated, tracheostomy  5/13 perm cath  5/14 worsening hypoxemia, shock suddenly, RV dilated, no new CXR changes, treated for PE empirically  5/21 doing t-piece trials May 23: Trial off of CVVHD May 25 remains off of  ventilator support since May 21, remains anuric, plan for CVVHD again today  Consults:  PCCM 4/21  Procedures:  4/21 - ETT 7.5 >> 5/12 5/12 Trach >> 4/21 - RIJ CVC [facility] >>5/10 5/10 - CVC >> 5/13 4/22 L IJ HD Cath>5/15 5/13 R IJ Permcath>    Significant Diagnostic Tests:  POC U/S 4/21: Bilateral interstitial pattern with bilateral dense consolidation at bases. Echo shows low-normal LVSF. RV moderately dilated with septal shift.  5/14 Echo> LVEF > 65%, RV dilated, decreased RV function (moderate) with RVSP 60  Micro Data:  Blood cultures -PND SARS-CoV2 02/23/2019 - - POSITIVE HIV 4/21 -neg Quant gold 4/2 1- neg .....................Marland Kitchen resp 4/29 >>rare MSSA and yeast  5/10 blood > neg 5/18 c. Diff > neg  Anti-covid RX and antimicrobial RX  Azithromycin 4/21 >>4/25 Ceftriaxone 4/22 >>4/25 Actemra 4/22 Hydroxychloroquine 4/21 > 4/22 ........................... Ceftriaxone 5/2 >>5/10 Cefepime 5/10>>> 5/12 Zosyn 5/12 >>5/17 Vancomycin 5/10>>>5/17  Fluconazole 5/12 > 5/17   Interim history/subjective:   Remains off ventilator Was put back on CVVHD yesterday He is nervous about going back to [facility]  Objective   Blood pressure 108/79, pulse 72, temperature 97.6 F (36.4 C), temperature source Oral, resp. rate 18, height 5\' 5"  (1.651 m), weight 59.6 kg, SpO2 100 %.    FiO2 (%):  [40 %] 40 %   Intake/Output Summary (Last 24 hours) at 03/30/2019 0711 Last data filed at 03/30/2019 0700 Gross per 24 hour  Intake 2113.48 ml  Output 1690 ml  Net 423.48 ml   Filed Weights   03/26/19 0500 03/27/19 0500  03/30/19 0200  Weight: 59.9 kg 61.4 kg 59.6 kg    Examination:  General:  Resting comfortably in bed HENT: NCAT trach in place PULM: CTA B, normal effort CV: RRR, no mgr GI: BS+, soft, nontender MSK: normal bulk and tone Neuro: awake, alert, no distress, MAEW    Resolved Hospital Problem list     Assessment & Plan:  Suspected  pulmonary embolism: Acute hypoxemia, RV dilation on echo Continue heparin, if no bleeding can resume eliquis Would plan for 3 months treatment course  Acute respiratory failure with hypoxemia, recovering ARDS from COVID-19: Downsize to #4 cuffless Continue speaking valve trials Monitor O2 saturation Would keep an eye on volume status, seemed bo breathe more comfortably after resuming dialysis yesterday afternoon  Acute kidney injury: now ESRD Transfer to [facility] today for intermittent HD  PCCM will sign off   Best practice:  Diet: per [facility]  Labs   CBC: Recent Labs  Lab 03/26/19 1045 03/27/19 0715 03/28/19 0514 03/28/19 2000 03/29/19 0500 03/30/19 0220  WBC 13.1* 16.2* 12.8*  --  11.3* 8.4  HGB 7.0* 7.3* 6.1* 7.4* 8.5* 9.2*  HCT 24.6* 24.5* 21.0* 24.0* 26.9* 29.6*  MCV 103.8* 102.1* 99.5  --  94.1 96.1  PLT 346 345 335  --  309 151    Basic Metabolic Panel: Recent Labs  Lab 03/24/19 0355  03/25/19 0657  03/26/19 1045  03/27/19 0715 03/28/19 0514 03/28/19 2000 03/29/19 0500 03/29/19 1643 03/30/19 0220  NA 136   < >  --    < > 135   < > 134* 134* 133* 135 135 136  K 4.5   < >  --    < > 5.5*   < > 5.0 5.4* 5.6* 5.6* 4.7 5.0  CL 99   < >  --    < > 101   < > 98 98 97* 97* 98 99  CO2 28   < >  --    < > 27   < > 29 25 24 24 27 29   GLUCOSE 124*   < >  --    < > 124*   < > 149* 135* 151* 123* 222* 108*  BUN 41*   < >  --    < > 30*   < > 32* 87* 114* 124* 98* 69*  CREATININE 2.05*   < >  --    < > 1.83*   < > 1.94* 4.37* 5.85* 6.81* 5.43* 3.61*  CALCIUM 10.1   < >  --    < > 10.0   < > 10.3 11.7* 11.8* 12.1* 10.6* 10.2  MG 3.0*  --  3.0*  --  3.0*  --  3.1*  --   --   --   --  3.1*  PHOS 2.9   < >  --    < > 2.9   < > 3.0 7.3* 9.0* 10.0* 6.4* 4.8*   < > = values in this interval not displayed.   GFR: Estimated Creatinine Clearance: 21.1 mL/min (A) (by C-G formula based on SCr of 3.61 mg/dL (H)). Recent Labs  Lab 03/27/19 0715 03/28/19 0514 03/29/19 0500  03/30/19 0220  WBC 16.2* 12.8* 11.3* 8.4    Liver Function Tests: Recent Labs  Lab 03/24/19 0355  03/28/19 0514 03/28/19 2000 03/29/19 0500 03/29/19 1643 03/30/19 0220  AST 52*  --   --   --   --   --   --  ALT 50*  --   --   --   --   --   --   ALKPHOS 177*  --   --   --   --   --   --   BILITOT 0.4  --   --   --   --   --   --   PROT 9.4*  --   --   --   --   --   --   ALBUMIN 3.2*  3.1*   < > 2.8* 2.8* 2.7* 2.8* 2.8*   < > = values in this interval not displayed.   No results for input(s): LIPASE, AMYLASE in the last 168 hours. No results for input(s): AMMONIA in the last 168 hours.  ABG    Component Value Date/Time   PHART 7.381 03/18/2019 1112   PCO2ART 42.1 03/18/2019 1112   PO2ART 50.0 (L) 03/18/2019 1112   HCO3 25.1 03/18/2019 1112   TCO2 26 03/18/2019 1112   ACIDBASEDEF 1.0 03/16/2019 0450   O2SAT 85.0 03/18/2019 1112     Coagulation Profile: No results for input(s): INR, PROTIME in the last 168 hours.  Cardiac Enzymes: No results for input(s): CKTOTAL, CKMB, CKMBINDEX, TROPONINI in the last 168 hours.  HbA1C: Hgb A1c MFr Bld  Date/Time Value Ref Range Status  03/23/2019 06:15 AM 5.5 4.8 - 5.6 % Final    Comment:    (NOTE) Pre diabetes:          5.7%-6.4% Diabetes:              >6.4% Glycemic control for   <7.0% adults with diabetes     CBG: Recent Labs  Lab 03/29/19 1159 03/29/19 1520 03/29/19 1948 03/29/19 2335 03/30/19 0333  GLUCAP 113* 160* 141* 109* 136*     Critical care time: n/a     Roselie Awkward, MD [place] [facility] Pager: [phone] Cell: [phone] If no response, call [phone]

## 2019-03-30 NOTE — Progress Notes (Addendum)
Nutrition Follow-up RD working remotely.  DOCUMENTATION CODES:   Not applicable  INTERVENTION:   Continue TF via NGT, change formula:  Nepro at 40 ml/h  Pro-stat 30 ml TID  Provides 2028 kcal, 123 gm protein, 698 ml free water daily  Check vitamin C, zinc, copper, vitamin B-6, Vitamin B-12, and thiamine levels. At risk for deficiency since on CRRT for a while.  NUTRITION DIAGNOSIS:   Inadequate oral intake related to inability to eat as evidenced by NPO status.  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 Admitted with fever, cough, AMS, and respiratory distress. COVID-19 positive. S/P convalescent plasma transfusion on 5/3. Transferred to [place] on 5/6.  Patient was extubated on 5/21. NGT placed on 5/23. Receiving Vital 1.5 at 45 ml/h with Pro-stat 60 ml BID. Tolerating well. Free water flushes 100 ml every 8 hours.   Remains on CRRT today. Plans for transfer to [facility] for intermittent HD soon. Will change TF formula to Nepro in anticipation of transition to iHD and to meet re-estimated needs.   Labs reviewed. BUN 69 (H), creatinine 3.61 (H), phosphorus 4.8 (H), potassium 5 (WNL) CBG's: [phone]  Medications reviewed and include Novolog, Lantus, Lokelma.   I/O -3 L since admission, weight down 18.1 kg.  Currently 4% below IBW.   Intake/Output Summary (Last 24 hours) at 03/30/2019 1206 Last data filed at 03/30/2019 1200 Gross per 24 hour  Intake 2167.6 ml  Output 2193 ml  Net -25.4 ml    Diet Order:   Diet Order            Diet NPO time specified Except for: Sips with Meds  Diet effective midnight              EDUCATION NEEDS:   No  education needs have been identified at this time  Skin:  Skin Assessment: Skin Integrity Issues: Skin Integrity Issues:: DTI, Stage II DTI: L and R side of face Stage II: nose  Last BM:  5/26  Height:   Ht Readings from Last 1 Encounters:  03/18/19 '5\' 5"'  (1.651 m)    Weight:   Wt Readings from Last 1 Encounters:  03/30/19 59.6 kg    Ideal Body Weight:  61.8 kg  BMI:  Body mass index is 21.87 kg/m.  Estimated Nutritional Needs:   Kcal:  1800-2100  Protein:  120 gm  Fluid:  1 L +UOP    Molli Barrows, RD, LDN, CNSC Pager [phone] After Hours Pager [phone]

## 2019-03-30 NOTE — Progress Notes (Signed)
Physical Therapy Treatment Patient Details Name: Jonathan [name] MRN: [id number] DOB: Feb 23, 1971 Today's Date: 03/30/2019    History of Present Illness 48 y.o. male admitted on 02/23/19 with AMS, O2 sats in the ED were 50%.  Pt dx with COVID 19 and resultant ARDS.  He was intubated 4/21 adn remains intubated.  His course is complicated by acute kidney injury requiring CVVHD.      PT Comments    Patient continues to progress, however does require increased time for rest/recovery between activities. Initiated gait training with pt demonstrating posterior lean and extremely short step length. RR up to 45, SaO2 decr to 85% with seated slow recovery. Unable to transfer OOB to chair due to ICU room now has 3 patients and CRRT machine (ie lack of space in room for chair). Pt very fatigued at end of session, therefore placed in partial chair position with RN agreeing to place in full chair position after he has rested.    Follow Up Recommendations  CIR     Equipment Recommendations  None recommended by PT    Recommendations for Other Services Rehab consult     Precautions / Restrictions Precautions Precautions: Other (comment) Precaution Comments: R IJ HD catheter, on /trach/T bar Required Braces or Orthoses: Other Brace Other Brace: Prevalon =- no longer needs Restrictions Weight Bearing Restrictions: No    Mobility  Bed Mobility Overal bed mobility: Needs Assistance Bed Mobility: Supine to Sit;Sit to Supine     Supine to sit: Mod assist;+2 for safety/equipment;HOB elevated Sit to supine: Max assist;+2 for physical assistance;+2 for safety/equipment   General bed mobility comments: Mod A to power up into upright position and then scoot hips towards EOB. Pt able to bring BLEs towards EOB. Pt requiring assistance to lower trunk and bring BLEs over EOB when returning to supine. +2 needed  Transfers Overall transfer level: Needs assistance Equipment used: 2 person hand held  assist;1 person hand held assist Transfers: Sit to/from Stand Sit to Stand: Min assist         General transfer comment: Min guard A for safety in rise. Min A for posterior lean once in standing. RN managing CRRT lines  Ambulation/Gait Ambulation/Gait assistance: Mod assist;Max assist Gait Distance (Feet): 5 Feet(fwd, 5 backward) Assistive device: 2 person hand held assist(plus RN to manage lines) Gait Pattern/deviations: Step-through pattern;Decreased step length - right;Decreased step length - left;Leaning posteriorly;Narrow base of support     General Gait Details: mod assist due to posterior lean walking forward; increased to max assist due to post lean stepping backwards back to bed   Stairs             Wheelchair Mobility    Modified Rankin (Stroke Patients Only)       Balance Overall balance assessment: Needs assistance Sitting-balance support: Feet unsupported Sitting balance-Leahy Scale: Fair Sitting balance - Comments: kyphotic/forward head but able to move into extension     Standing balance-Leahy Scale: Poor Standing balance comment: Requiring physical A for balance                            Cognition Arousal/Alertness: Awake/alert Behavior During Therapy: WFL for tasks assessed/performed Overall Cognitive Status: Difficult to assess                                 General Comments: Appropriately following commands and answering yes no  questions       Exercises      General Comments General comments (skin integrity, edema, etc.): [facility] interpreter Freida Busman [phone]. 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[place]   PT Goals (current goals can now be found in the care plan section) Acute Rehab PT Goals Patient Stated Goal: to get better/stronger Time For Goal Achievement: 04/13/19 Potential to Achieve Goals: Good Progress towards PT goals: Progressing toward goals    Frequency    Min 3X/week      PT Plan Discharge plan needs to be updated    Co-evaluation PT/OT/SLP Co-Evaluation/Treatment: Yes Reason for Co-Treatment: Complexity of the patient's impairments (multi-system involvement);For patient/therapist safety PT goals addressed during session: Mobility/safety with mobility;Balance OT goals addressed during session: ADL's and self-care      AM-PAC PT "6 Clicks" Mobility   Outcome Measure  Help needed turning from your back to your side while in a flat bed without using bedrails?: A Lot Help needed moving from lying on your back to sitting on the side of a flat bed without using bedrails?: A Lot Help needed moving to and from a bed to a chair (including a wheelchair)?: Total Help needed standing up from a chair using your arms (e.g., wheelchair or bedside chair)?: A Lot Help needed to walk in hospital room?: A Lot Help needed climbing 3-5 steps with a railing? : Total 6 Click Score: 10    End of Session Equipment Utilized During Treatment: Oxygen Activity Tolerance: Patient limited by fatigue Patient left: in bed;with call bell/phone within reach;with nursing/sitter in room(chair position at 45 degrees; OOB impossible 2/2 room size) Nurse Communication: Mobility status PT Visit Diagnosis: Muscle weakness (generalized) (M62.81);Difficulty in walking, not elsewhere classified (R26.2)     Time: 4462-8638 PT Time Calculation (min) (ACUTE ONLY): 35 min  Charges:  $Therapeutic Activity:  8-22 mins                        [employer], PT 03/30/2019, 12:00 PM

## 2019-03-30 NOTE — Progress Notes (Signed)
Assisted tele visit to patient with daughter.  Alver Sorrow, RN

## 2019-03-30 NOTE — Progress Notes (Addendum)
Occupational Therapy Treatment Patient Details Name: Jonathan [name] MRN: [id number] DOB: Mar 25, 1971 Today's Date: 03/30/2019    History of present illness 48 y.o. male admitted on 02/23/19 with AMS, O2 sats in the ED were 50%.  Pt dx with COVID 19 and resultant ARDS.  He was intubated 4/21 adn remains intubated.  His course is complicated by acute kidney injury requiring CVVHD.     OT comments  Pt progressing towards established OT goals and is highly motivated to participate in therapy. Pt performing functional mobility (5 feet forward and back) requiring Min A +2 for forward and Mod-Max A +2 for backward. Pt requiring rest break (~6 minutes) seated EOB with back support to lower RR and elevated SpO2. Pt sitting at EOB to complete oral care with Min Guard A +2 for safety and assistance with multiple lines. RN present throughout. Continue to recommend dc to CIR and will continue to follow acutely as admitted.   RR 45 - highest; SpO2 85% on 14L at 40% - lowest; HR 102; BP 99/72 (80) and 110/95 (101) [place] interpreter Freida Busman [phone]   Follow Up Recommendations  CIR    Equipment Recommendations  3 in 1 bedside commode    Recommendations for Other Services Rehab consult    Precautions / Restrictions Precautions Precautions: Other (comment) Precaution Comments: R IJ HD catheter, on /trach/T bar Required Braces or Orthoses: Other Brace Other Brace: Prevalon =- no longer needs Restrictions Weight Bearing Restrictions: No       Mobility Bed Mobility Overal bed mobility: Needs Assistance Bed Mobility: Supine to Sit;Sit to Supine     Supine to sit: Mod assist;+2 for safety/equipment;HOB elevated Sit to supine: Max assist;+2 for physical assistance;+2 for safety/equipment   General bed mobility comments: Mod A to power up into upright position and then scoot hips towards EOB. Pt able to bring BLEs towards EOB. Pt requiring assistance to lower trunk and bring BLEs over EOB when  returning to supine. +2 needed  Transfers Overall transfer level: Needs assistance   Transfers: Sit to/from Stand Sit to Stand: Min assist         General transfer comment: Min guard A for safety in rise. Min A for posterior lean once in standing.    Balance Overall balance assessment: Needs assistance Sitting-balance support: Feet unsupported Sitting balance-Leahy Scale: Fair Sitting balance - Comments: kyphotic/forward head but able to move into extension     Standing balance-Leahy Scale: Poor Standing balance comment: Requiring physical A for balance                           ADL either performed or assessed with clinical judgement   ADL Overall ADL's : Needs assistance/impaired Eating/Feeding: NPO   Grooming: Wash/dry face;Oral care;Sitting;Minimal assistance;Min guard Grooming Details (indicate cue type and reason): Pt sitting at EOB and maintaining balance with Min guard A for safety. Providing swab and mouth wash for pt to perform oral care. Requiring assistance for line management                              Functional mobility during ADLs: Maximal assistance;+2 for physical assistance;Minimal assistance;Moderate assistance;+2 for safety/equipment(Min A for forward steps and Mod-Max A for backwards steps) General ADL Comments: Pt performing short distance mobility near bed and oral care at EOB.      Vision       Perception  Praxis      Cognition Arousal/Alertness: Awake/alert Behavior During Therapy: WFL for tasks assessed/performed Overall Cognitive Status: Difficult to assess                                 General Comments: Appropriately following commands and answering yes no questions         Exercises     Shoulder Instructions       General Comments [place] interpreter Freida Busman [phone]. 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[place]   Frequency  Min 3X/week        Progress Toward Goals  OT Goals(current goals can now be found in the care plan section)  Progress towards OT goals: Progressing toward goals  Acute Rehab OT Goals Patient Stated Goal: to get better/stronger OT Goal Formulation: With patient Time For Goal Achievement: 04/05/19 Potential to Achieve Goals: Good ADL Goals Pt Will Perform Grooming: with min assist;sitting Pt Will Perform Upper Body Bathing: with min assist;sitting Pt Will Perform Lower Body Bathing: with mod assist;bed level Additional ADL Goal #1: Pt will maintain midline postural control EOB x 10 min wiht mod A with VSS in preparation for ADL Additional ADL Goal #2: Pt will tolerate OOB in chair x 1 hour and engage in therapeutic activity.  Plan Discharge plan remains appropriate    Co-evaluation    PT/OT/SLP Co-Evaluation/Treatment: Yes Reason for Co-Treatment: Complexity of the patient's impairments (multi-system involvement);For patient/therapist safety;To address functional/ADL transfers   OT goals addressed during session: ADL's and self-care      AM-PAC OT "6 Clicks" Daily Activity     Outcome Measure   Help from another person eating meals?: Total Help from another person taking care of personal grooming?: A Lot Help from another person toileting, which includes using toliet, bedpan, or urinal?: Total Help from another person bathing (including washing, rinsing, drying)?: A Lot Help from another  person to put on and taking off regular upper body clothing?: A Lot Help from another person to put on and taking off regular lower body clothing?: A Lot 6 Click Score: 10    End of Session Equipment Utilized During Treatment: Oxygen(14L with T bar at 40%)  OT Visit Diagnosis: Other abnormalities of gait and mobility (R26.89);Muscle weakness (generalized) (M62.81);Pain Pain - Right/Left: Right Pain - part of body: Shoulder   Activity Tolerance Patient tolerated treatment well   Patient Left in bed;with call bell/phone within reach;with nursing/sitter in room   Nurse Communication Mobility status        Time: 2426-8341 OT Time Calculation (min): 35 min  Charges: OT General Charges $OT Visit: 1 Visit OT Treatments $Self Care/Home Management : 8-22 mins  [employer], OTR/L Acute Rehab Pager: [phone] Office: [phone]  [place] 03/30/2019, 11:36 AM

## 2019-03-30 NOTE — Progress Notes (Signed)
Unable to complete full change of shift assessment. [facility] present to take patient to [facility]. Patient stable at transfer. Assessment completed charted.

## 2019-03-30 NOTE — Progress Notes (Signed)
Provided patient's daughter, Chrissie Noa with an update via phone and assisted with getting connected with [facility] tele visit.

## 2019-03-30 NOTE — Progress Notes (Signed)
  Speech Language Pathology Treatment: Nada Boozer Speaking valve  Patient Details Name: English Craighead MRN: [id number] DOB: June 07, 1971 Today's Date: 03/30/2019 Time: 7342-8768 SLP Time Calculation (min) (ACUTE ONLY): 23 min  Assessment / Plan / Recommendation Clinical Impression  Pt had his PMV in place with RN present upon SLP arrival. Teleinterpreter Terrial Rhodes [phone]) utilized during session. Direct supervision was provided by SLP for over 20 minutes with no overt signs of intolerance with green valve in place with T bar. Pt communicated primarily via whispers today, with Mod cues provided to try to facilitate more initiation of phonation. SLP also palpated pt's volitional swallow, which her performed x10 repetitions. He is likely not ready for POs given his aphonia, audible wetness, and coughing with dry swallows. His respiratory status has also been more tenuous, although this is improving. Note that plans are for pt to transfer to [facility] for HD.Marland Kitchen Anticipate that he will likely need to test negative for COVID-19 before being able to go to radiology for a swallow study. Will continue to follow for PMV use and to attempt pre-swallow activity until ready to participate in objective testing.    HPI HPI: Pt is a 48 y.o. male admitted on 02/23/19 with AMS, O2 sats in the ED were 50%.  Pt dx with COVID 19 and resultant ARDS.  He was intubated 4/21-5/11; reintubated and then trached on 5/12. His course is complicated by acute kidney injury requiring CVVHD.  PMH includes DM2, HTN.      SLP Plan  Continue with current plan of care       Recommendations         Patient may use Passy-Muir Speech Valve: Intermittently with supervision(full supervision) PMSV Supervision: Full MD: Please consider changing trach tube to : Smaller size;Cuffless         Oral Care Recommendations: Oral care QID Follow up Recommendations: Inpatient Rehab SLP Visit Diagnosis: Aphonia (R49.1) Plan: Continue with  current plan of care       Avalon Loyed Wilmes 03/30/2019, 11:52 AM  Pollyann Glen, M.A. [place] Acute Environmental education officer [phone] Office [phone]

## 2019-03-30 NOTE — Progress Notes (Signed)
ANTICOAGULATION CONSULT NOTE - Initial Consult  Pharmacy Consult for heparin Indication: R/o PE   Not on File  Patient Measurements: Height: 5\' 5"  (165.1 cm) Weight: 131 lb 6.3 oz (59.6 kg) IBW/kg (Calculated) : 61.5  Vital Signs: Temp: 97.9 F (36.6 C) (05/25 2319) Temp Source: Oral (05/25 2319) BP: 102/63 (05/26 0300) Pulse Rate: 80 (05/26 0300)  Labs: Recent Labs    03/27/19 0715 03/28/19 0514 03/28/19 2000 03/29/19 0500 03/29/19 1643 03/29/19 1955 03/30/19 0220  HGB 7.3* 6.1* 7.4* 8.5*  --   --  9.2*  HCT 24.5* 21.0* 24.0* 26.9*  --   --  29.6*  PLT 345 335  --  309  --   --  299  APTT 57*  --   --   --   --   --  62*  HEPARINUNFRC 0.18*  --   --   --   --  0.24* 0.36  CREATININE 1.94* 4.37* 5.85* 6.81* 5.43*  --   --     Estimated Creatinine Clearance: 14 mL/min (A) (by C-G formula based on SCr of 5.43 mg/dL (H)).   Medical History: No past medical history on file.  Medications:  No medications prior to admission.    Assessment: 78 YOM who was on IV heparin for an extended period of time and transitioned to apixaban on 5/23 for r/o PE. Pt then experienced a drop in Hgb on 5/24 requiring 2u PRBCs. Apixaban was stopped but now hgb remains stable with no s/s of overt bleeding. Pharmacy consulted to restart IV heparin.   Of note, patient is resuming CRRT today with heparin ordered through CRRT circuit - this is running at 500 units/hr.  Heparin level therapeutic at 0.36. aPTT is correlating so no need to check further aPTTs.  Goal of Therapy:  Heparin level 0.3-0.7 units/ml Monitor platelets by anticoagulation protocol: Yes   Plan:  Continue heparin gtt at 750 units/hr Monitor daily heparin level, CBC, s/s of bleed  Elenor Quinones, PharmD, BCPS, BCIDP Clinical Pharmacist 03/30/2019 3:57 AM

## 2019-03-30 NOTE — Progress Notes (Signed)
Order received for trach change.  6.0 cuffed shiley trach removed; 4.0 uncuffed shiley replaced without difficulty.  ETCO2 positive color change post change.  Patient tolerated well.

## 2019-03-30 NOTE — Progress Notes (Signed)
Patient being transferred to [facility].

## 2019-03-30 NOTE — Progress Notes (Addendum)
Subjective:  FiO2 0.4, stable, have d/w PMD [facility]. Will transfer to [facility] today for iHD.  Getting TF's now but they are downsizing trach and will be cuff-less trach so po intake should improve if this works  Objective Vital signs in last 24 hours: Vitals:   03/30/19 1030 03/30/19 1100 03/30/19 1130 03/30/19 1200  BP:  110/81  105/82  Pulse: 77 85 86 83  Resp: (!) 21 (!) 23 (!) 26 20  Temp:    98.1 F (36.7 C)  TempSrc:    Oral  SpO2: 99% 98% 98% 100%  Weight:      Height:       Weight change:   Intake/Output Summary (Last 24 hours) at 03/30/2019 1227 Last data filed at 03/30/2019 1200 Gross per 24 hour  Intake 2167.6 ml  Output 2193 ml  Net -25.4 ml   Physical Exam:  Patient not examined directly given COVID-19 + status, utilizing exam of the primary team and observations of RN's.     CXR 5/17 diffuse stable infiltrates CXR 5/21 no chg stable infiltrates  Assessment/ Plan: Pt is a 48 y.o. yo male who was admitted on 02/23/2019 with anuric AKI due to ATN in the setting of COVID   Assessment/Plan: 1. AKI-  On CRRT since 4/21. Now off pressors w/ good BP's.  NO UOP so no signs of renal recovery yet and still requiring RRT. SP TDC. Overall improving. On T-piece for a good while the past 2-3 days.  If off vent and not requiring ICU care could do intermittent HD at [facility] - I/O +420 cc yest - B/Cr/ K+ all better back on CRRT today - will transfer to [place] for intermittent HD, next HD Thursday - ^ K+ , cont Lokelma, use low K+ bath w/ HD;  "renal" diet when able to eat  2. HTN/vol- normotensive  3. Anemia- hgb low- supportive care for now- no iron in the setting of sepsis. Will started darbe 100 ug /wk SQ 1st dose 5/23.  Cont to transfuse as needed  4. ID-  covid positive. SP IV course of IV abx   5. VDRF- sp trach, off vent 2-3 days, downsizing trach today per primary/ [facility] 03/30/2019, 12:27 PM    Labs: Basic Metabolic Panel: Recent Labs   Lab 03/29/19 0500 03/29/19 1643 03/30/19 0220  NA 135 135 136  K 5.6* 4.7 5.0  CL 97* 98 99  CO2 24 27 29   GLUCOSE 123* 222* 108*  BUN 124* 98* 69*  CREATININE 6.81* 5.43* 3.61*  CALCIUM 12.1* 10.6* 10.2  PHOS 10.0* 6.4* 4.8*   Liver Function Tests: Recent Labs  Lab 03/24/19 0355  03/29/19 0500 03/29/19 1643 03/30/19 0220  AST 52*  --   --   --   --   ALT 50*  --   --   --   --   ALKPHOS 177*  --   --   --   --   BILITOT 0.4  --   --   --   --   PROT 9.4*  --   --   --   --   ALBUMIN 3.2*  3.1*   < > 2.7* 2.8* 2.8*   < > = values in this interval not displayed.   No results for input(s): LIPASE, AMYLASE in the last 168 hours. No results for input(s): AMMONIA in the last 168 hours. CBC: Recent Labs  Lab 03/26/19 1045 03/27/19 0715  03/28/19 0514 03/28/19 2000 03/29/19 0500 03/30/19 0220  WBC 13.1* 16.2* 12.8*  --  11.3* 8.4  HGB 7.0* 7.3* 6.1* 7.4* 8.5* 9.2*  HCT 24.6* 24.5* 21.0* 24.0* 26.9* 29.6*  MCV 103.8* 102.1* 99.5  --  94.1 96.1  PLT 346 345 335  --  309 299   Cardiac Enzymes: No results for input(s): CKTOTAL, CKMB, CKMBINDEX, TROPONINI in the last 168 hours. CBG: Recent Labs  Lab 03/29/19 1948 03/29/19 2335 03/30/19 0333 03/30/19 0754 03/30/19 1113  GLUCAP 141* 109* 136* 113* 116*    Iron Studies: No results for input(s): IRON, TIBC, TRANSFERRIN, FERRITIN in the last 72 hours. Studies/Results: No results found. Medications: Infusions: . sodium chloride    . sodium chloride Stopped (03/21/19 2128)  . heparin 10,000 units/ 20 mL infusion syringe 500 Units/hr (03/30/19 1100)  . heparin 750 Units/hr (03/30/19 1200)    Scheduled Medications: . chlorhexidine  15 mL Mouth Rinse BID  . Chlorhexidine Gluconate Cloth  6 each Topical Daily  . clonazePAM  0.25 mg Per Tube Daily  . darbepoetin (ARANESP) injection - NON-DIALYSIS  100 mcg Subcutaneous Q Sat-1800  . famotidine  20 mg Per Tube Daily  . feeding supplement (NEPRO CARB STEADY)  1,000  mL Per Tube Q24H  . feeding supplement (PRO-STAT SUGAR FREE 64)  30 mL Per Tube TID  . free water  100 mL Per Tube Q8H  . insulin aspart  0-15 Units Subcutaneous Q4H  . insulin aspart  6 Units Subcutaneous Q4H  . insulin glargine  10 Units Subcutaneous BID  . mouth rinse  15 mL Mouth Rinse q12n4p  . oxyCODONE  5 mg Oral Q12H  . sodium chloride flush  10-40 mL Intracatheter Q12H  . sodium zirconium cyclosilicate  10 g Oral BID    have reviewed scheduled and prn medications.

## 2019-03-31 ENCOUNTER — Inpatient Hospital Stay (HOSPITAL_COMMUNITY): Payer: HRSA Program

## 2019-03-31 DIAGNOSIS — Z430 Encounter for attention to tracheostomy: Secondary | ICD-10-CM

## 2019-03-31 DIAGNOSIS — Z789 Other specified health status: Secondary | ICD-10-CM

## 2019-03-31 LAB — GLUCOSE, CAPILLARY
Glucose-Capillary: 107 mg/dL — ABNORMAL HIGH (ref 70–99)
Glucose-Capillary: 107 mg/dL — ABNORMAL HIGH (ref 70–99)
Glucose-Capillary: 117 mg/dL — ABNORMAL HIGH (ref 70–99)
Glucose-Capillary: 125 mg/dL — ABNORMAL HIGH (ref 70–99)
Glucose-Capillary: 135 mg/dL — ABNORMAL HIGH (ref 70–99)
Glucose-Capillary: 96 mg/dL (ref 70–99)

## 2019-03-31 LAB — HEPARIN LEVEL (UNFRACTIONATED)
Heparin Unfractionated: <0.1 IU/mL — ABNORMAL LOW (ref 0.30–0.70)
Heparin Unfractionated: 0.16 IU/mL — ABNORMAL LOW (ref 0.30–0.70)

## 2019-03-31 MED ORDER — MUPIROCIN CALCIUM 2 % EX CREA
TOPICAL_CREAM | Freq: Two times a day (BID) | CUTANEOUS | Status: DC
  Administered 2019-03-31 – 2019-04-19 (×37): via TOPICAL
  Filled 2019-03-31 (×4): qty 15

## 2019-03-31 MED ORDER — HEPARIN (PORCINE) 25000 UT/250ML-% IV SOLN
1150.0000 [IU]/h | INTRAVENOUS | Status: DC
  Administered 2019-03-31 (×2): 950 [IU]/h via INTRAVENOUS
  Administered 2019-04-01 – 2019-04-02 (×2): 1150 [IU]/h via INTRAVENOUS
  Filled 2019-03-31 (×3): qty 250

## 2019-03-31 NOTE — Progress Notes (Addendum)
12 Fr NGT placed into left nares with minimal difficulty. Tube is marked at the 2 hash mark. Tube secured using NGT holder to nose and taped to left side of forehead. Explained to pt about the NGT reason and what he needs to do to get released from Wrist Restraints. Shook his head yes that he understood. Tele Sitter remains in place. LUE restraint was secured close to the [facility] and pt scooted to the FOB and pulled the drsg off both cheeks.

## 2019-03-31 NOTE — Progress Notes (Signed)
Transported patient from 2W36 to CT and back without event.

## 2019-03-31 NOTE — Progress Notes (Signed)
Horris Latino from [employer] called to state pt was off monitor. Checked on pt and he was laying on his left side on the floor, head on pillow, and head against night stand. Pillow case was covered with blood. Pt placed on blanket and dead lifted by 3 nurses back to bed. Dr. Baltazar Najjar about the fall, pt's head with a somewhat deep laceration and pt on a heparin drip. Head CT to be ordered. Dr. Baltazar Najjar also notified pt pulled out his NGT. RT notified r/t pt's sats in the 80's. Pt has a trach. RT suctioned pt multiple times and sats didn't rise. Inner Canula taken out and cleaned by RT after she had used the Ambu Bag with 100% oxygen. Other RT members called per RT request to come and assist. Pt pulled out IV IV team called to start a new site. Dr. Baltazar Najjar notified again r/t pt's oxygen levels and the level of care RT was doing.  Dr.Kirby at bedside and assessed pt and applied Steri Strips to forehead.  Approx 0100 pt taken by bed with nurse and RT for CT of the head 0130 Returned from CT 0200: Clearance from Dr. Baltazar Najjar to restart the Heparin drip and place the NGT.

## 2019-03-31 NOTE — Progress Notes (Signed)
Subjective:  Transferred to [facility], seen in room  Objective Vital signs in last 24 hours: Vitals:   03/31/19 0559 03/31/19 0742 03/31/19 0840 03/31/19 1217  BP:  117/81 117/81 124/84  Pulse: 94 88 88 84  Resp: (!) 22 19 (!) 22 18  Temp:  98.2 F (36.8 C)    TempSrc:  Oral    SpO2: (!) 87% 99% 90% 100%  Weight:      Height:       Weight change:   Intake/Output Summary (Last 24 hours) at 03/31/2019 1242 Last data filed at 03/31/2019 0700 Gross per 24 hour  Intake 814.15 ml  Output 143 ml  Net 671.15 ml   Physical Exam: Trach, NG tube , R chest HD cath, seen in room, no distress Chest cta bilat Cor reg Abd soft ntnd no ascites  Ext no edema LE's or UE's   CXR 5/17 diffuse stable infiltrates  CXR 5/21 no chg stable infiltrates  Assessment/ Plan: Pt is a 48 y.o. yo male who was admitted on 02/23/2019 with anuric AKI due to ATN in the setting of COVID   Assessment/Plan: 1. AKI-  On CRRT since 4/21. No signs of renal recovery. SP TDC. Overall improving, off vent since 5/21 - wt's stable, exam stable for volume - plan HD tomorrow, no labs today - "renal" diet when able to eat  2. HTN/vol- normotensive  3. Anemia- hgb 6- 9 range. Started darbe 100 ug /wk SQ 1st dose 5/23.  Cont to transfuse as needed. Chest tsat.   4. ID-  covid positive. SP IV course of IV abx   5. VDRF- sp trach, off vent    [facility] 03/31/2019, 12:42 PM    Labs: Basic Metabolic Panel: Recent Labs  Lab 03/29/19 0500 03/29/19 1643 03/30/19 0220  NA 135 135 136  K 5.6* 4.7 5.0  CL 97* 98 99  CO2 24 27 29   GLUCOSE 123* 222* 108*  BUN 124* 98* 69*  CREATININE 6.81* 5.43* 3.61*  CALCIUM 12.1* 10.6* 10.2  PHOS 10.0* 6.4* 4.8*   Liver Function Tests: Recent Labs  Lab 03/29/19 0500 03/29/19 1643 03/30/19 0220  ALBUMIN 2.7* 2.8* 2.8*   No results for input(s): LIPASE, AMYLASE in the last 168 hours. No results for input(s): AMMONIA in the last 168  hours. CBC: Recent Labs  Lab 03/26/19 1045 03/27/19 0715 03/28/19 0514 03/28/19 2000 03/29/19 0500 03/30/19 0220  WBC 13.1* 16.2* 12.8*  --  11.3* 8.4  HGB 7.0* 7.3* 6.1* 7.4* 8.5* 9.2*  HCT 24.6* 24.5* 21.0* 24.0* 26.9* 29.6*  MCV 103.8* 102.1* 99.5  --  94.1 96.1  PLT 346 345 335  --  309 299   Cardiac Enzymes: No results for input(s): CKTOTAL, CKMB, CKMBINDEX, TROPONINI in the last 168 hours. CBG: Recent Labs  Lab 03/30/19 1945 03/30/19 2359 03/31/19 0457 03/31/19 0742 03/31/19 1200  GLUCAP 107* 125* 107* 96 107*    Iron Studies: No results for input(s): IRON, TIBC, TRANSFERRIN, FERRITIN in the last 72 hours. Studies/Results: Dg Abd 1 View  Result Date: 03/31/2019 CLINICAL DATA:  Check gastric catheter placement EXAM: ABDOMEN - 1 VIEW COMPARISON:  03/27/2019 FINDINGS: Gastric catheter is noted within the stomach. Proximal side port lies at the gastroesophageal junction. This could be advanced a few cm further into the stomach. IMPRESSION: Gastric catheter within the stomach although could be advanced further into the stomach. Electronically Signed   By: Inez Catalina M.D.   On: 03/31/2019 03:21  Ct Head Wo Contrast  Result Date: 03/31/2019 CLINICAL DATA:  Who is fall with forehead laceration, initial encounter EXAM: CT HEAD WITHOUT CONTRAST TECHNIQUE: Contiguous axial images were obtained from the base of the skull through the vertex without intravenous contrast. COMPARISON:  None. FINDINGS: Brain: No evidence of acute infarction, hemorrhage, hydrocephalus, extra-axial collection or mass lesion/mass effect. Vascular: No hyperdense vessel or unexpected calcification. Skull: Normal. Negative for fracture or focal lesion. Sinuses/Orbits: No acute finding. Other: Mild soft tissue swelling is noted in the right forehead consistent with the recent injury. IMPRESSION: Soft tissue swelling in the right forehead consistent with the recent injury. No acute intracranial abnormality is  noted. Electronically Signed   By: Inez Catalina M.D.   On: 03/31/2019 01:36   Dg Abd Portable 1v  Result Date: 03/31/2019 CLINICAL DATA:  Nasogastric placement EXAM: PORTABLE ABDOMEN - 1 VIEW COMPARISON:  Earlier same day FINDINGS: Two views show any is a gastric tube entering the stomach with its tip in the fundus. Contrast present throughout a normal appearing colon. IMPRESSION: Nasogastric tube tip in the gastric fundus. Electronically Signed   By: Nelson Chimes M.D.   On: 03/31/2019 12:19   Medications: Infusions: . sodium chloride    . sodium chloride Stopped (03/21/19 2128)  . heparin 950 Units/hr (03/31/19 1045)    Scheduled Medications: . chlorhexidine  15 mL Mouth Rinse BID  . Chlorhexidine Gluconate Cloth  6 each Topical Daily  . clonazepam  0.25 mg Per Tube Daily  . darbepoetin (ARANESP) injection - NON-DIALYSIS  100 mcg Subcutaneous Q Sat-1800  . famotidine  20 mg Per Tube Daily  . feeding supplement (NEPRO CARB STEADY)  1,000 mL Per Tube Q24H  . feeding supplement (PRO-STAT SUGAR FREE 64)  30 mL Per Tube TID  . free water  100 mL Per Tube Q8H  . insulin aspart  0-15 Units Subcutaneous Q4H  . insulin aspart  6 Units Subcutaneous Q4H  . insulin glargine  10 Units Subcutaneous BID  . mouth rinse  15 mL Mouth Rinse q12n4p  . mupirocin cream   Topical BID  . oxyCODONE  5 mg Oral Q12H  . sodium chloride flush  10-40 mL Intracatheter Q12H  . sodium zirconium cyclosilicate  10 g Oral BID    have reviewed scheduled and prn medications.

## 2019-03-31 NOTE — Progress Notes (Signed)
Text sent to Dr. Baltazar Najjar pt is now attempting to pull out IV and off monitor leads. Also informed Dr. Baltazar Najjar pt's HS Lantus not given and why.

## 2019-03-31 NOTE — Progress Notes (Signed)
Notified by Jonathan [name] Pt has pulled out his new NGT. Text sent to Dr. Baltazar Najjar

## 2019-03-31 NOTE — Progress Notes (Signed)
RT called to bedside Patient desatting in low 80's.  RT bagged and lavaged patient dislodging a small mucous plug.  RT also cleaned patient inner Cannula.  Patient placed back on 50% Venti mask.  Patient vitals stable satting 94%.  RT will continue to monitor.

## 2019-03-31 NOTE — Consult Note (Addendum)
[place] Nurse wound follow up Assessment performed for MDRPIs to bilat cheeks, which previously occurred related to ETT tape and prone positioning when the patient was critically ill with Covid-19. No photos have been entered into the EMR and there is not an iPad or remote camera available in the patient's room. The patient has improved, is no longer prone, and has a trach, so there are no further medical devices across the cheeks at this time. Primary team: Can you please place photos of the bilateral cheek wounds in the EMR. It will helpful to have them documented in the medical record and is greatly appreciated by the [place] team.  Wound type: Unstageable pressure injuries to bilat cheeks Measurement: .8X.8cm Wound bed: 100% soft and black eschar, beginning to lift at the edges Drainage (amount, consistency, odor) no odor or drainage Dressing procedure/placement/frequency: Bactroban to promote moist healing and assist with loosening the eschar; expect these wounds will eventually evolve into full thickness tissue loss and scaring to bilat cheeks. Pt could benefit from consult by plastic surgery to provide further input once the slough loosens, please order if desired.   [place] team will continue to follow for weekly assessment and will adjust topical treatment as needed.  Julien Girt MSN, RN, [place], [place], [place]

## 2019-03-31 NOTE — Progress Notes (Signed)
Physical Therapy Treatment Patient Details Name: Jonathan [name] MRN: [id number] DOB: Apr 23, 1971 Today's Date: 03/31/2019    History of Present Illness 48 y.o. male admitted on 02/23/19 with AMS, O2 sats in the ED were 50%.  Pt dx with COVID 19 and resultant ARDS.  He was intubated 4/21 adn remains intubated.  His course is complicated by acute kidney injury requiring CVVHD.      PT Comments    Session limited by short lines and pt report of dizziness/fatigue.  He is on higher level of O2 on his T-bar today at 55% on 15 L through t-bar trach, productive cough throughout with lowest O2 sat 90%, HR 100s and despite reports of dizziness/lightheadedness his BP is stable, checked multiple times throughout session.  It would be helpful to bring a portable O2 tank to the room next session to help free up his O2 line and allow for standing and transferring.  Alarm belt would also be the most useful of alarms for him.  PT will continue to follow acutely for safe mobility progression.    Follow Up Recommendations  CIR     Equipment Recommendations  None recommended by PT    Recommendations for Other Services Rehab consult     Precautions / Restrictions Precautions Precautions: Fall Precaution Comments: R IJ HD catheter, on /trach/T bar, NGT    Mobility  Bed Mobility Overal bed mobility: Needs Assistance Bed Mobility: Supine to Sit;Sit to Supine     Supine to sit: Min assist;HOB elevated Sit to supine: Min assist;HOB elevated   General bed mobility comments: Min assist to support trunk due to posterior preference during transitions to EOB.  Pt able to progress bil legs over and back into bed unassisted.   Transfers                 General transfer comment: Unable to attempt due to short O2 line and lines on multiple sides of the bed as well as no +2 help to move O2 line to portable tank at end of bed.           Balance Overall balance assessment: Needs  assistance Sitting-balance support: Feet unsupported;Bilateral upper extremity supported Sitting balance-Leahy Scale: Poor Sitting balance - Comments: posterior lean requiring min assist as he does LE exercises.                                      Cognition Arousal/Alertness: Awake/alert Behavior During Therapy: Flat affect Overall Cognitive Status: Difficult to assess                                 General Comments: Pt is spanish speaking and has a trach.  He followed commands consistantly with no lag in ability to respond to commands.        Exercises General Exercises - Upper Extremity Elbow Flexion: AROM;Both;10 reps Elbow Extension: AROM;Both;10 reps General Exercises - Lower Extremity Ankle Circles/Pumps: AROM;Both;10 reps Long Arc Quad: AROM;Both;10 reps Hip Flexion/Marching: AROM;Both;10 reps    General Comments General comments (skin integrity, edema, etc.): Pt on 55% TC 15 L O2, O2 down to 90% with frequent coughing, reports of dizziness, BPs stable HR in low 100s.  Pt positioned in maximum chair mode at end of session with bil wrist restraints and bed alarm re-activated.  Pt has telesitter today.  Pertinent Vitals/Pain Pain Assessment: No/denies pain Faces Pain Scale: No hurt           PT Goals (current goals can now be found in the care plan section) Acute Rehab PT Goals Patient Stated Goal: none stated Progress towards PT goals: Progressing toward goals    Frequency    Min 3X/week      PT Plan Current plan remains appropriate       AM-PAC PT "6 Clicks" Mobility   Outcome Measure  Help needed turning from your back to your side while in a flat bed without using bedrails?: A Little Help needed moving from lying on your back to sitting on the side of a flat bed without using bedrails?: A Little Help needed moving to and from a bed to a chair (including a wheelchair)?: A Little Help needed standing up from a chair  using your arms (e.g., wheelchair or bedside chair)?: A Lot Help needed to walk in hospital room?: A Lot Help needed climbing 3-5 steps with a railing? : Total 6 Click Score: 14    End of Session Equipment Utilized During Treatment: Oxygen Activity Tolerance: Patient limited by fatigue Patient left: in bed;with call bell/phone within reach;with family/visitor present   PT Visit Diagnosis: Muscle weakness (generalized) (M62.81);Difficulty in walking, not elsewhere classified (R26.2)     Time: 7543-6067 PT Time Calculation (min) (ACUTE ONLY): 40 min  Charges:  $Therapeutic Exercise: 8-22 mins $Therapeutic Activity: 23-37 mins                    Cadon Raczka B. Kortnee Bas, PT, DPT  Acute Rehabilitation [phone] pager #[phone] office   03/31/2019, 11:44 AM

## 2019-03-31 NOTE — Progress Notes (Signed)
[place] for heparin Indication: R/o PE   Not on File  Patient Measurements: Height: 5\' 5"  (165.1 cm) Weight: 131 lb 6.3 oz (59.6 kg) IBW/kg (Calculated) : 61.5  Vital Signs: Temp: 97.6 F (36.4 C) (05/27 0557) Temp Source: Oral (05/27 0557) BP: 121/81 (05/27 0557) Pulse Rate: 94 (05/27 0559)  Labs: Recent Labs    03/28/19 2000 03/29/19 0500 03/29/19 1643 03/29/19 1955 03/30/19 0220  HGB 7.4* 8.5*  --   --  9.2*  HCT 24.0* 26.9*  --   --  29.6*  PLT  --  309  --   --  299  APTT  --   --   --   --  62*  HEPARINUNFRC  --   --   --  0.24* 0.36  CREATININE 5.85* 6.81* 5.43*  --  3.61*   Estimated Creatinine Clearance: 21.1 mL/min (A) (by C-G formula based on SCr of 3.61 mg/dL (H)).  Medical History: No past medical history on file.  Medications:  No medications prior to admission.   Assessment: 19 YOM who was on IV heparin for an extended period of time and transitioned to apixaban on 5/23 for r/o PE. Pt then experienced a drop in Hgb on 5/24 requiring 2u PRBCs. Apixaban was stopped but now hgb remains stable with no s/s of overt bleeding. Pharmacy consulted to restart IV heparin.   Off CRRT, plan Hemodialysis, tx to [facility] 5/26 pm aPTT is correlating so no need to check further aPTTs.  Goal of Therapy:  Heparin level 0.3-0.7 units/ml Monitor platelets by anticoagulation protocol: Yes   Today, 03/31/2019 Fall in room 5/26 pm, head laceration - Heparin stopped, no intracranial process per CT.  Heparin resumed about 0200 at 750 units/hr.  0818 hep level subtherapeutic at < 0.1 units/ml, no problems with infusion after resumption   Plan:  Still with some oozing from laceration, hesitate to re-bolus Will increase Heparin to 950 units/hr Recheck Hep level in 8 hr from rate increase Daily hep level and CBC  Minda Ditto PharmD Phone [phone] Clinical Pharmacist 03/31/2019 7:44 AM

## 2019-03-31 NOTE — Progress Notes (Signed)
PROGRESS NOTE  Jonathan [name] VQM:[id number] DOB: 1970/11/25 DOA: 02/23/2019  PCP: Patient, No Pcp Per  Brief History/Interval Summary:  - 48 y.o. male [place] speaking, arrived from [place] on 02/13/2019 and was living in close quarters with 6 other workers. He developed increasing rrespiratory distress and was intubated for ARDS due to COVID-19 infection. He was started on CRRT due to ATN, paralyzed and prone. He was transferred to [place] for further management. He was eventually extubated, but quickly became short of breath again and aspirated when he started vomiting. He was re intubated and ended up with tracheostomy.   Events: 02/23/2019 admitted to the ICU. 02/24/2019 started on CRRT 4/21-4/24 Proned daily 4/28 -4/30 proned daily  03/04/2019 increase in oxygen demand require paralytic for 24 hours. 03/09/2019 hematochezia from gastric aspirat 5/11 extubated 5/12 re-intubated, tracheostomy 5/13 perm cath 5/14 worsening hypoxemia, shock suddenly, RV dilated, no new CXR changes, treated for PE empirically 5/21 doing t-piece trials 5/23: Trial off of CVVHD 5/24: 2 units PRBC 5/25 remains off of ventilator support since May 21, remains anuric, 5/26  5/26 patient will be transferred to [facility] as he will need intermittent hemodialysis which is not available at [facility].  Lines and tubes: 4/21 - ETT >>5.11.2020 4/21 - RIJ CVC 4/22 L IJ HD Cath> 5/13 Permacath right  Antibiotics: 02/23/2019-425 2020 azithromycin and Rocephin 02/23/2019-02/24/2019 03/06/2019 Rocephin 5/11-5/17 Vancomycin 5/12-5/18 Zosyn 5/12-5/17 Fluconazole for candida in tracheal aspirate and recent Actemra  Significant diagnostic Tests; POC U/S 4/21: Bilateral interstitial pattern with bilateral dense consolidation at bases Echo shows low-normal LVSF. RV moderately dilated with septal shift.  5/14 Echo> LVEF > 65%, RV dilated, decreased RV function (moderate) with RVSP 60  Subjective/Interval  History:  He is transferred from [facility] to [place] last night, he fell last night due to trying to get up, he denies confusion, there was no agitation He lost iv access and ng due to the fall, now all replaced, he is tolerating tube feeds and iv heparin, abrasion due to fall , no active bleed Patient awake alert.  No significant events O/N ,    Assessment/Plan:  Acute Hypoxic Resp. Failure due to Acute Covid 19 Viral Illness/ARDS  - Patient has been stable from a respiratory standpoint.  Weaned off the vent 5/21, no vent requirement since,  currently on trach T-piece. -Received steroids initially. -Received Actemra initially 4/22 -received convulsant plasma 5/3 -downsized to # 4 today. - Volume management with CRRT >> be transitioned to hemodialysis soon, to monitor volume status closely. -SLP following could not tolerate PMV or diet yet, his trach is downsized today, hopefully this will help.  Presumed acute PE - Patient had an acute episode of hypoxia with respiratory decompensation.  Echocardiogram showed right-sided strain.  Patient was empiricaly placed on IV heparin due to concern for underlying pulmonary embolism.  Lower extremity Doppler studies negative.  3 months of anticoagulation recommended.  -Was at one point, but all forms of anticoagulation were held given a drop of his hemoglobin, now currently stable, no significant, he is back on heparin GTT as of 5/25, will transition to Eliquis once stable.  Septic shock - Patient has been weaned off of pressors.  Acute kidney injury due to ATN - Patient remains on CRRT.  Nephrology is following and managing.  No signs of renal recovery.  No urine output . - As tolerated 2 days off CRRT, discussed with renal, patient is ready to transition to intermittent hemodialysis, he received CRRT  for last 24 hours,  will continue CRRT for next 24 hours, then will transfer to: For intermittent hemodialysis.  Anemia due to chronic illness and  possible acute GI blood loss -No evidance  of significant GI bleed, likely in the setting of suctioning, this has resolved . -Received 2 units PRBC 03/28/2019, hemoglobin stable 9.2 at  this morning .  Leukocytosis Patient remains afebrile.  WBC is improving.  Stool studies were negative for C. difficile a few days ago.  Continue to monitor.     Diabetes mellitus type 2 Patient on basal insulin as well as sliding scale coverage.  HbA1c 5.5.    FEN On CRRT.  Monitor electrolytes.  Labs are pending from this morning.  Continue tube feedings.    Pressure injury on the skin of nose Local wound care. May need plastic surgery consult if patient agrees to   DVT Prophylaxis: On heparin infusion  Lab Results  Component Value  following, not tolerate PMV yet   PLT 345 03/27/2019     PUD Prophylaxis: On famotidine Code Status: Partial code Family Communication:  Wife Energy manager number [phone].  Discussing with family on daily basis via interpreter (Interpreter number is [phone]. If you call her she will connect you to family and interpret for you) disposition Plan: needs trach downsizing, speech eval,  He is currently on tube feeds, LTAC vs snf placement    Consultants: Pulmonology.  Nephrology.     Medications:  Scheduled: . chlorhexidine  15 mL Mouth Rinse BID  . Chlorhexidine Gluconate Cloth  6 each Topical Daily  . clonazepam  0.25 mg Per Tube Daily  . darbepoetin (ARANESP) injection - NON-DIALYSIS  100 mcg Subcutaneous Q Sat-1800  . famotidine  20 mg Per Tube Daily  . feeding supplement (NEPRO CARB STEADY)  1,000 mL Per Tube Q24H  . feeding supplement (PRO-STAT SUGAR FREE 64)  30 mL Per Tube TID  . free water  100 mL Per Tube Q8H  . insulin aspart  0-15 Units Subcutaneous Q4H  . insulin aspart  6 Units Subcutaneous Q4H  . insulin glargine  10 Units Subcutaneous BID  . mouth rinse  15 mL Mouth Rinse q12n4p  . mupirocin cream    Topical BID  . oxyCODONE  5 mg Oral Q12H  . sodium chloride flush  10-40 mL Intracatheter Q12H  . sodium zirconium cyclosilicate  10 g Oral BID   Continuous: . sodium chloride    . sodium chloride Stopped (03/21/19 2128)  . heparin 950 Units/hr (03/31/19 1455)   CXK:GYJEH/UDJSHFWY arterial line **AND** sodium chloride, sodium chloride, acetaminophen (TYLENOL) oral liquid 160 mg/5 mL, glycopyrrolate, heparin, lip balm, metoprolol tartrate, ondansetron (ZOFRAN) IV, oxyCODONE, sodium chloride flush   Objective:  Vital Signs  Vitals:   03/31/19 0742 03/31/19 0840 03/31/19 1217 03/31/19 1714  BP: 117/81 117/81 124/84   Pulse: 88 88 84 94  Resp: 19 (!) 22 18 (!) 23  Temp: 98.2 F (36.8 C)     TempSrc: Oral     SpO2: 99% 90% 100% 99%  Weight:      Height:        Intake/Output Summary (Last 24 hours) at 03/31/2019 1935 Last data filed at 03/31/2019 1800 Gross per 24 hour  Intake 421.76 ml  Output -  Net 421.76 ml   Filed Weights   03/26/19 0500 03/27/19 0500 03/30/19 0200  Weight: 59.9 kg 61.4 kg 59.6 kg   Awake, alert, in bed, in no apparent distress  trach  in place, currently on T-piece  Good air movement bilaterally, CTAB RRR,No Gallops,Rubs or new Murmurs, +ve B.Sounds, Abd Soft, No tenderness, No rebound - guarding or rigidity. No Cyanosis, Clubbing or edema, No new Rash or bruise       Lab Results:  Data Reviewed: I have personally reviewed following labs and imaging studies  CBC: Recent Labs  Lab 03/26/19 1045 03/27/19 0715 03/28/19 0514 03/28/19 2000 03/29/19 0500 03/30/19 0220  WBC 13.1* 16.2* 12.8*  --  11.3* 8.4  HGB 7.0* 7.3* 6.1* 7.4* 8.5* 9.2*  HCT 24.6* 24.5* 21.0* 24.0* 26.9* 29.6*  MCV 103.8* 102.1* 99.5  --  94.1 96.1  PLT 346 345 335  --  309 785    Basic Metabolic Panel: Recent Labs  Lab 03/25/19 0657  03/26/19 1045  03/27/19 0715 03/28/19 0514 03/28/19 2000 03/29/19 0500 03/29/19 1643 03/30/19 0220  NA  --    < > 135   < >  134* 134* 133* 135 135 136  K  --    < > 5.5*   < > 5.0 5.4* 5.6* 5.6* 4.7 5.0  CL  --    < > 101   < > 98 98 97* 97* 98 99  CO2  --    < > 27   < > 29 25 24 24 27 29   GLUCOSE  --    < > 124*   < > 149* 135* 151* 123* 222* 108*  BUN  --    < > 30*   < > 32* 87* 114* 124* 98* 69*  CREATININE  --    < > 1.83*   < > 1.94* 4.37* 5.85* 6.81* 5.43* 3.61*  CALCIUM  --    < > 10.0   < > 10.3 11.7* 11.8* 12.1* 10.6* 10.2  MG 3.0*  --  3.0*  --  3.1*  --   --   --   --  3.1*  PHOS  --    < > 2.9   < > 3.0 7.3* 9.0* 10.0* 6.4* 4.8*   < > = values in this interval not displayed.    GFR: Estimated Creatinine Clearance: 21.1 mL/min (A) (by C-G formula based on SCr of 3.61 mg/dL (H)).  Liver Function Tests: Recent Labs  Lab 03/28/19 0514 03/28/19 2000 03/29/19 0500 03/29/19 1643 03/30/19 0220  ALBUMIN 2.8* 2.8* 2.7* 2.8* 2.8*    CBG: Recent Labs  Lab 03/30/19 2359 03/31/19 0457 03/31/19 0742 03/31/19 1200 03/31/19 1614  GLUCAP 125* 107* 96 107* 117*     Recent Results (from the past 240 hour(s))  C difficile quick scan w PCR reflex     Status: None   Collection Time: 03/22/19  3:26 PM  Result Value Ref Range Status   C Diff antigen NEGATIVE NEGATIVE Final   C Diff toxin NEGATIVE NEGATIVE Final   C Diff interpretation No C. difficile detected.  Final    Comment: Performed at [facility], [place] [address], [place], [place] [address]      Radiology Studies: Dg Abd 1 View  Result Date: 03/31/2019 CLINICAL DATA:  Check gastric catheter placement EXAM: ABDOMEN - 1 VIEW COMPARISON:  03/27/2019 FINDINGS: Gastric catheter is noted within the stomach. Proximal side port lies at the gastroesophageal junction. This could be advanced a few cm further into the stomach. IMPRESSION: Gastric catheter within the stomach although could be advanced further into the stomach. Electronically Signed   By: Linus Mako.D.  On: 03/31/2019 03:21   Ct Head Wo Contrast  Result Date:  03/31/2019 CLINICAL DATA:  Who is fall with forehead laceration, initial encounter EXAM: CT HEAD WITHOUT CONTRAST TECHNIQUE: Contiguous axial images were obtained from the base of the skull through the vertex without intravenous contrast. COMPARISON:  None. FINDINGS: Brain: No evidence of acute infarction, hemorrhage, hydrocephalus, extra-axial collection or mass lesion/mass effect. Vascular: No hyperdense vessel or unexpected calcification. Skull: Normal. Negative for fracture or focal lesion. Sinuses/Orbits: No acute finding. Other: Mild soft tissue swelling is noted in the right forehead consistent with the recent injury. IMPRESSION: Soft tissue swelling in the right forehead consistent with the recent injury. No acute intracranial abnormality is noted. Electronically Signed   By: Inez Catalina M.D.   On: 03/31/2019 01:36   Dg Abd Portable 1v  Result Date: 03/31/2019 CLINICAL DATA:  Nasogastric placement EXAM: PORTABLE ABDOMEN - 1 VIEW COMPARISON:  Earlier same day FINDINGS: Two views show any is a gastric tube entering the stomach with its tip in the fundus. Contrast present throughout a normal appearing colon. IMPRESSION: Nasogastric tube tip in the gastric fundus. Electronically Signed   By: Nelson Chimes M.D.   On: 03/31/2019 12:19       LOS: 22 days   Florencia Reasons MD PhD  [facility] Pager on [email or web address]  03/31/2019, 7:35 PM

## 2019-03-31 NOTE — Progress Notes (Addendum)
RN paged after pt was found lying beside of his bed in the floor. Laceration noted to the right forehead with some bleeding. Heparin gtt is off because pt pulled out IV and NGT. Will get a stat CT head to r/o bleed and then go look at pt's laceration. Before above could occur, pt began to desat into the 80s. NP to bedside stat.  RT in room and cleaned inner cannula of trach and suctioned pt for some pink tinged mucus. After that, he was satting in the mid 2s. Told that trach was changed from a 6 to a 4 on 03/30/2019.  Steristrips applied to pt's laceration which is only oozing a small amt of blood which stops with pressure.  Pt is exhibiting NO focal neuro deficits. MOE x 4. No other injury noted after found in floor.  CT is coming to get pt. Trying to get another IV. Hold Heparin until CT head is resulted as negative. Replace NGT after CT.  KJKG, NP [facility] Addendum: CT head negative for acute finding except for soft tissue swelling at laceration area. Apply ice to laceration area. Restart heparin. Replace NGT and xray to confirm placement. KJKG, NP [facility]

## 2019-03-31 NOTE — Progress Notes (Signed)
ANTICOAGULATION CONSULT NOTE  Pharmacy Consult for heparin Indication: R/o PE   Not on File  Patient Measurements: Height: 5\' 5"  (165.1 cm) Weight: 131 lb 6.3 oz (59.6 kg) IBW/kg (Calculated) : 61.5  Vital Signs: BP: 124/84 (05/27 1217) Pulse Rate: 94 (05/27 1714)  Labs: Recent Labs    03/29/19 0500 03/29/19 1643  03/30/19 0220 03/31/19 0818 03/31/19 1927  HGB 8.5*  --   --  9.2*  --   --   HCT 26.9*  --   --  29.6*  --   --   PLT 309  --   --  299  --   --   APTT  --   --   --  62*  --   --   HEPARINUNFRC  --   --    < > 0.36 <0.10* 0.16*  CREATININE 6.81* 5.43*  --  3.61*  --   --    < > = values in this interval not displayed.   Estimated Creatinine Clearance: 21.1 mL/min (A) (by C-G formula based on SCr of 3.61 mg/dL (H)).  Medical History: No past medical history on file.  Medications:  No medications prior to admission.   Assessment: 82 YOM who was on IV heparin for an extended period of time and transitioned to apixaban on 5/23 for r/o PE. Pt then experienced a drop in Hgb on 5/24 requiring 2u PRBCs. Apixaban was stopped but now hgb remains stable with no s/s of overt bleeding. Pharmacy consulted to restart IV heparin.   Pt fell yesterday evening, heparin stopped. CT head negative and heparin resumed. Repeat heparin level this evening remains low at 0.16, mild oozing from trach site per RN, otherwise no S/Sx bleeding.  Goal of Therapy:  Heparin level 0.3-0.7 units/ml Monitor platelets by anticoagulation protocol: Yes   Plan: -Increase heparin to 1150 units/hr -Recheck heparin level with morning labs   Arrie Senate, PharmD, BCPS Clinical Pharmacist [phone] Please check AMION for all [place] numbers 03/31/2019

## 2019-04-01 DIAGNOSIS — Z930 Tracheostomy status: Secondary | ICD-10-CM

## 2019-04-01 LAB — BASIC METABOLIC PANEL
Anion gap: 11 (ref 5–15)
BUN: 105 mg/dL — ABNORMAL HIGH (ref 6–20)
CO2: 29 mmol/L (ref 22–32)
Calcium: 12.5 mg/dL — ABNORMAL HIGH (ref 8.9–10.3)
Chloride: 95 mmol/L — ABNORMAL LOW (ref 98–111)
Creatinine, Ser: 6.51 mg/dL — ABNORMAL HIGH (ref 0.61–1.24)
GFR calc Af Amer: 11 mL/min — ABNORMAL LOW (ref >60)
GFR calc non Af Amer: 9 mL/min — ABNORMAL LOW (ref >60)
Glucose, Bld: 136 mg/dL — ABNORMAL HIGH (ref 70–99)
Potassium: 4.3 mmol/L (ref 3.5–5.1)
Sodium: 135 mmol/L (ref 135–145)

## 2019-04-01 LAB — GLUCOSE, CAPILLARY
Glucose-Capillary: 107 mg/dL — ABNORMAL HIGH (ref 70–99)
Glucose-Capillary: 119 mg/dL — ABNORMAL HIGH (ref 70–99)
Glucose-Capillary: 121 mg/dL — ABNORMAL HIGH (ref 70–99)
Glucose-Capillary: 151 mg/dL — ABNORMAL HIGH (ref 70–99)
Glucose-Capillary: 168 mg/dL — ABNORMAL HIGH (ref 70–99)
Glucose-Capillary: 92 mg/dL (ref 70–99)
Glucose-Capillary: 99 mg/dL (ref 70–99)

## 2019-04-01 LAB — IRON AND TIBC
Iron: 31 ug/dL — ABNORMAL LOW (ref 45–182)
Saturation Ratios: 9 % — ABNORMAL LOW (ref 17.9–39.5)
TIBC: 333 ug/dL (ref 250–450)
UIBC: 302 ug/dL

## 2019-04-01 LAB — HEPARIN LEVEL (UNFRACTIONATED)
Heparin Unfractionated: 0.35 IU/mL (ref 0.30–0.70)
Heparin Unfractionated: 0.35 IU/mL (ref 0.30–0.70)

## 2019-04-01 LAB — VITAMIN C: Vitamin C: 1.5 mg/dL (ref 0.4–2.0)

## 2019-04-01 MED ORDER — OXYCODONE HCL 5 MG/5ML PO SOLN: 5.0000 mg | Freq: Four times a day (QID) | ORAL | Status: DC | PRN

## 2019-04-01 MED ORDER — CHLORHEXIDINE GLUCONATE CLOTH 2 % EX PADS: 6.0000 | MEDICATED_PAD | Freq: Every day | CUTANEOUS | Status: DC

## 2019-04-01 MED ORDER — HEPARIN SODIUM (PORCINE) 1000 UNIT/ML DIALYSIS
2000.0000 [IU] | INTRAMUSCULAR | Status: DC | PRN
  Filled 2019-04-01: qty 2

## 2019-04-01 MED ORDER — SODIUM ZIRCONIUM CYCLOSILICATE 10 G PO PACK
10.0000 g | PACK | Freq: Every day | ORAL | Status: DC
  Administered 2019-04-03 – 2019-04-06 (×4): 10 g via ORAL
  Filled 2019-04-01 (×5): qty 1

## 2019-04-01 MED ORDER — GUAIFENESIN 100 MG/5ML PO SOLN
5.0000 mL | Freq: Two times a day (BID) | ORAL | Status: DC
  Administered 2019-04-01 (×2): 100 mg
  Filled 2019-04-01 (×3): qty 5

## 2019-04-01 MED ORDER — OXYCODONE HCL 5 MG/5ML PO SOLN
5.0000 mg | Freq: Two times a day (BID) | ORAL | Status: DC
  Administered 2019-04-01 (×2): 5 mg
  Filled 2019-04-01 (×3): qty 5

## 2019-04-01 MED ORDER — HEPARIN SODIUM (PORCINE) 1000 UNIT/ML IJ SOLN
3.4000 mL | Freq: Once | INTRAMUSCULAR | Status: AC
  Administered 2019-04-01: 3400 [IU] via INTRAVENOUS
  Filled 2019-04-01 (×3): qty 3.4

## 2019-04-01 NOTE — Progress Notes (Signed)
PROGRESS NOTE  Jonathan [name] IRS:[id number] DOB: [birth year] DOA: 02/23/2019  PCP: Patient, No Pcp Per  Brief History/Interval Summary:  - 48 y.o. male [name] speaking, arrived from [place] on 02/13/2019 and was living in close quarters with 6 other workers. He developed increasing rrespiratory distress and was intubated for ARDS due to COVID-19 infection. He was started on CRRT due to ATN, paralyzed and prone. He was transferred to [place] for further management. He was eventually extubated, but quickly became short of breath again and aspirated when he started vomiting. He was re intubated and ended up with tracheostomy.   Events: 02/23/2019 admitted to the ICU. 02/24/2019 started on CRRT 4/21-4/24 Proned daily 4/28 -4/30 proned daily  03/04/2019 increase in oxygen demand require paralytic for 24 hours. 03/09/2019 hematochezia from gastric aspirat 5/11 extubated 5/12 re-intubated, tracheostomy 5/13 perm cath 5/14 worsening hypoxemia, shock suddenly, RV dilated, no new CXR changes, treated for PE empirically 5/21 doing t-piece trials 5/23: Trial off of CVVHD 5/24: 2 units PRBC 5/25 remains off of ventilator support since May 21, remains anuric, 5/26  5/26 patient will be transferred to [facility] as he will need intermittent hemodialysis which is not available at [facility].  Lines and tubes: 4/21 - ETT >>5.11.2020 4/21 - RIJ CVC 4/22 L IJ HD Cath> 5/13 Permacath right  Antibiotics: 02/23/2019-425 2020 azithromycin and Rocephin 02/23/2019-02/24/2019 03/06/2019 Rocephin 5/11-5/17 Vancomycin 5/12-5/18 Zosyn 5/12-5/17 Fluconazole for candida in tracheal aspirate and recent Actemra  Significant diagnostic Tests; POC U/S 4/21: Bilateral interstitial pattern with bilateral dense consolidation at bases Echo shows low-normal LVSF. RV moderately dilated with septal shift.  5/14 Echo> LVEF > 65%, RV dilated, decreased RV function (moderate) with RVSP 60  Subjective/Interval  History:  Patient awake alert.  No significant events O/N ,  He appear stronger  Still has significant mucus, requiring frequent suction He is tolerating tube feeds  Assessment/Plan:  Acute Hypoxic Resp. Failure due to Acute Covid 19 Viral Illness/ARDS  - Patient has been stable from a respiratory standpoint.  Weaned off the vent 5/21, no vent requirement since,  currently on trach T-piece. -Received steroids initially. -Received Actemra initially 4/22 -received convulsant plasma 5/3 -- Volume management with CRRT >> be transitioned to hemodialysis soon, to monitor volume status closely. -SLP following could not tolerate PMV or diet yet, pulmonology following for trach care and downsizing  The night after transferred from [facility] to [place] , he fell last night due to trying to get up, he denies confusion, there was no agitation He lost iv access and ng due to the fall, now all replaced, he is tolerating tube feeds and iv heparin, abrasion due to fall , no active bleed     Presumed acute PE - Patient had an acute episode of hypoxia with respiratory decompensation.  Echocardiogram showed right-sided strain.  Patient was empiricaly placed on IV heparin due to concern for underlying pulmonary embolism.  Lower extremity Doppler studies negative.  3 months of anticoagulation recommended.  -Was at one point, but all forms of anticoagulation were held given a drop of his hemoglobin, now currently stable, no significant, he is back on heparin GTT as of 5/25, will transition to Eliquis once stable.  Septic shock - Patient has been weaned off of pressors.  Acute kidney injury due to ATN - Patient remains on CRRT.  Nephrology is following and managing.  No signs of renal recovery.  No urine output . - As tolerated 2 days off CRRT, discussed with  renal, patient is ready to transition to intermittent hemodialysis, he received CRRT for last 24 hours,  will continue CRRT for next 24 hours, then  will transfer to: For intermittent hemodialysis.  Anemia due to chronic illness and possible acute GI blood loss -No evidance  of significant GI bleed, likely in the setting of suctioning, this has resolved . -Received 2 units PRBC 03/28/2019, hemoglobin stable 9.2 at  this morning .  Leukocytosis Patient remains afebrile.  WBC is improving.  Stool studies were negative for C. difficile a few days ago.  Continue to monitor.     Diabetes mellitus type 2 Patient on basal insulin as well as sliding scale coverage.  HbA1c 5.5.    FEN On CRRT.  Monitor electrolytes.  Labs are pending from this morning.  Continue tube feedings.    Pressure injury on the skin of nose Local wound care. May need plastic surgery consult if patient agrees to   DVT Prophylaxis: On heparin infusion  Lab Results  Component Value  following, not tolerate PMV yet   PLT 345 03/27/2019     PUD Prophylaxis: On famotidine Code Status: Partial code Family Communication:  Wife Energy manager number [phone].  Discussing with family on daily basis via interpreter (Interpreter number is [phone]. If you call her she will connect you to family and interpret for you) disposition Plan: needs trach downsizing, speech eval,  He is currently on tube feeds, LTAC vs snf placement    Consultants: Pulmonology.  Nephrology.     Medications:  Scheduled: . chlorhexidine  15 mL Mouth Rinse BID  . Chlorhexidine Gluconate Cloth  6 each Topical Daily  . clonazepam  0.25 mg Per Tube Daily  . darbepoetin (ARANESP) injection - NON-DIALYSIS  100 mcg Subcutaneous Q Sat-1800  . famotidine  20 mg Per Tube Daily  . feeding supplement (NEPRO CARB STEADY)  1,000 mL Per Tube Q24H  . feeding supplement (PRO-STAT SUGAR FREE 64)  30 mL Per Tube TID  . free water  100 mL Per Tube Q8H  . guaiFENesin  5 mL Per Tube BID  . insulin aspart  0-15 Units Subcutaneous Q4H  . insulin aspart  6 Units Subcutaneous  Q4H  . insulin glargine  10 Units Subcutaneous BID  . mouth rinse  15 mL Mouth Rinse q12n4p  . mupirocin cream   Topical BID  . oxyCODONE  5 mg Oral Q12H  . sodium chloride flush  10-40 mL Intracatheter Q12H  . sodium zirconium cyclosilicate  10 g Oral BID   Continuous: . sodium chloride    . sodium chloride Stopped (03/21/19 2128)  . heparin 1,150 Units/hr (04/01/19 0600)   LEX:NTZGY/FVCBSWHQ arterial line **AND** sodium chloride, sodium chloride, acetaminophen (TYLENOL) oral liquid 160 mg/5 mL, glycopyrrolate, heparin, lip balm, metoprolol tartrate, ondansetron (ZOFRAN) IV, oxyCODONE, sodium chloride flush   Objective:  Vital Signs  Vitals:   04/01/19 0242 04/01/19 0243 04/01/19 0244 04/01/19 0435  BP:    118/81  Pulse: 99 98 97 88  Resp: 19 14 20 17   Temp:      TempSrc:      SpO2: (!) 86% 97% 97% 100%  Weight:      Height:        Intake/Output Summary (Last 24 hours) at 04/01/2019 0715 Last data filed at 04/01/2019 0600 Gross per 24 hour  Intake 2468.59 ml  Output 250 ml  Net 2218.59 ml   Filed Weights   03/26/19 0500 03/27/19 0500 03/30/19 0200  Weight:  59.9 kg 61.4 kg 59.6 kg   Awake, alert, in bed, in no apparent distress  trach in place, currently on T-piece  Good air movement bilaterally, CTAB RRR,No Gallops,Rubs or new Murmurs, +ve B.Sounds, Abd Soft, No tenderness, No rebound - guarding or rigidity. No Cyanosis, Clubbing or edema, No new Rash or bruise       Lab Results:  Data Reviewed: I have personally reviewed following labs and imaging studies  CBC: Recent Labs  Lab 03/26/19 1045 03/27/19 0715 03/28/19 0514 03/28/19 2000 03/29/19 0500 03/30/19 0220  WBC 13.1* 16.2* 12.8*  --  11.3* 8.4  HGB 7.0* 7.3* 6.1* 7.4* 8.5* 9.2*  HCT 24.6* 24.5* 21.0* 24.0* 26.9* 29.6*  MCV 103.8* 102.1* 99.5  --  94.1 96.1  PLT 346 345 335  --  309 563    Basic Metabolic Panel: Recent Labs  Lab 03/26/19 1045  03/27/19 0715 03/28/19 0514 03/28/19 2000  03/29/19 0500 03/29/19 1643 03/30/19 0220  NA 135   < > 134* 134* 133* 135 135 136  K 5.5*   < > 5.0 5.4* 5.6* 5.6* 4.7 5.0  CL 101   < > 98 98 97* 97* 98 99  CO2 27   < > 29 25 24 24 27 29   GLUCOSE 124*   < > 149* 135* 151* 123* 222* 108*  BUN 30*   < > 32* 87* 114* 124* 98* 69*  CREATININE 1.83*   < > 1.94* 4.37* 5.85* 6.81* 5.43* 3.61*  CALCIUM 10.0   < > 10.3 11.7* 11.8* 12.1* 10.6* 10.2  MG 3.0*  --  3.1*  --   --   --   --  3.1*  PHOS 2.9   < > 3.0 7.3* 9.0* 10.0* 6.4* 4.8*   < > = values in this interval not displayed.    GFR: Estimated Creatinine Clearance: 21.1 mL/min (A) (by C-G formula based on SCr of 3.61 mg/dL (H)).  Liver Function Tests: Recent Labs  Lab 03/28/19 0514 03/28/19 2000 03/29/19 0500 03/29/19 1643 03/30/19 0220  ALBUMIN 2.8* 2.8* 2.7* 2.8* 2.8*    CBG: Recent Labs  Lab 03/31/19 0742 03/31/19 1200 03/31/19 1614 03/31/19 2031 04/01/19 0042  GLUCAP 96 107* 117* 135* 107*     Recent Results (from the past 240 hour(s))  C difficile quick scan w PCR reflex     Status: None   Collection Time: 03/22/19  3:26 PM  Result Value Ref Range Status   C Diff antigen NEGATIVE NEGATIVE Final   C Diff toxin NEGATIVE NEGATIVE Final   C Diff interpretation No C. difficile detected.  Final    Comment: Performed at [facility], [place] [address] [place], [place] [address]      Radiology Studies: Dg Abd 1 View  Result Date: 03/31/2019 CLINICAL DATA:  Check gastric catheter placement EXAM: ABDOMEN - 1 VIEW COMPARISON:  03/27/2019 FINDINGS: Gastric catheter is noted within the stomach. Proximal side port lies at the gastroesophageal junction. This could be advanced a few cm further into the stomach. IMPRESSION: Gastric catheter within the stomach although could be advanced further into the stomach. Electronically Signed   By: Inez Catalina M.D.   On: 03/31/2019 03:21   Ct Head Wo Contrast  Result Date: 03/31/2019 CLINICAL DATA:  Who is  fall with forehead laceration, initial encounter EXAM: CT HEAD WITHOUT CONTRAST TECHNIQUE: Contiguous axial images were obtained from the base of the skull through the vertex without intravenous contrast. COMPARISON:  None.  FINDINGS: Brain: No evidence of acute infarction, hemorrhage, hydrocephalus, extra-axial collection or mass lesion/mass effect. Vascular: No hyperdense vessel or unexpected calcification. Skull: Normal. Negative for fracture or focal lesion. Sinuses/Orbits: No acute finding. Other: Mild soft tissue swelling is noted in the right forehead consistent with the recent injury. IMPRESSION: Soft tissue swelling in the right forehead consistent with the recent injury. No acute intracranial abnormality is noted. Electronically Signed   By: Inez Catalina M.D.   On: 03/31/2019 01:36   Dg Abd Portable 1v  Result Date: 03/31/2019 CLINICAL DATA:  Nasogastric placement EXAM: PORTABLE ABDOMEN - 1 VIEW COMPARISON:  Earlier same day FINDINGS: Two views show any is a gastric tube entering the stomach with its tip in the fundus. Contrast present throughout a normal appearing colon. IMPRESSION: Nasogastric tube tip in the gastric fundus. Electronically Signed   By: Nelson Chimes M.D.   On: 03/31/2019 12:19       LOS: 60 days   Florencia Reasons MD PhD  [facility] Pager on [email or web address]  04/01/2019, 7:15 AM

## 2019-04-01 NOTE — Progress Notes (Signed)
Pt remains asleep and bliat wrist restraints remain off.

## 2019-04-01 NOTE — Progress Notes (Signed)
Sitting in rm because system down for [employer]. Pt napping on and off. Pt released from right wrist restraint.  Reminded pt not to touch equipment including nose tube or he'll be restrained again. Pt shook head no. Will monitor closely.

## 2019-04-01 NOTE — Progress Notes (Signed)
RT went into rm to check on pt. Stated he remains asleep. Will continue to monitor closely

## 2019-04-01 NOTE — Progress Notes (Signed)
Sat 1:1 with pt r/t tele and tele sitter being down. Sat from approx 0130-0310. 0245: Pt desat to 80% suctioned x2 without recovery of sats. Took inner canula out and it was full of hard mucus. Cleaned inner canula and sats progressively increased to 99%. Pt napping.

## 2019-04-01 NOTE — Progress Notes (Signed)
Tele station informed to notify this nurse if pt's sats below 92%

## 2019-04-01 NOTE — Progress Notes (Signed)
ANTICOAGULATION CONSULT NOTE  Pharmacy Consult for heparin Indication: Presumed PE  Not on File  Patient Measurements: Height: 5\' 5"  (165.1 cm) Weight: 136 lb 3.9 oz (61.8 kg) IBW/kg (Calculated) : 61.5  Vital Signs: Temp: 97.4 F (36.3 C) (05/28 1750) Temp Source: Axillary (05/28 1750) BP: 120/83 (05/28 1750) Pulse Rate: 100 (05/28 1750)  Labs: Recent Labs    03/30/19 0220  03/31/19 1927 04/01/19 0917 04/01/19 1835  HGB 9.2*  --   --   --   --   HCT 29.6*  --   --   --   --   PLT 299  --   --   --   --   APTT 62*  --   --   --   --   HEPARINUNFRC 0.36   < > 0.16* 0.35 0.35  CREATININE 3.61*  --   --  6.51*  --    < > = values in this interval not displayed.   Estimated Creatinine Clearance: 12.1 mL/min (A) (by C-G formula based on SCr of 6.51 mg/dL (H)).  Medical History: No past medical history on file.  Medications:  No medications prior to admission.   Assessment: 21 YOM who was on IV heparin for an extended period of time and transitioned to apixaban on 5/23 for r/o PE. Pt then experienced a drop in Hgb on 5/24 requiring 2u PRBCs. Apixaban was stopped but now hgb remains stable with no s/s of overt bleeding. Pharmacy consulted to restart IV heparin.   Pt fell in room the evening of 5/26, heparin stopped at 2300. CT head negative and heparin resumed 5/27 ~ 0200.   Heparin level this evening remains therapeutic (HL 0.35 << 0.35, goal of 0.3-0.7). No issues noted at this time.   Goal of Therapy:  Heparin level 0.3-0.7 units/ml Monitor platelets by anticoagulation protocol: Yes   Plan: - Continue Heparin at 1150 units/hr (11.5 ml/hr) - Will continue to monitor for any signs/symptoms of bleeding and will follow up with heparin level in the a.m.   Thank you for allowing pharmacy to be a part of this patient's care.  Alycia Rossetti, PharmD, BCPS Clinical Pharmacist Clinical phone for 04/01/2019: [phone] 04/01/2019 7:11 PM   **Pharmacist phone directory can  now be found on [email or web address] ([email or web address]).  Listed under [place].

## 2019-04-01 NOTE — Progress Notes (Signed)
Occupational Therapy Treatment Patient Details Name: Jonathan [name] MRN: [id number] DOB: 08/03/1971 Today's Date: 04/01/2019    History of present illness 48 y.o. male admitted on 02/23/19 with AMS, O2 sats in the ED were 50%.  Pt dx with COVID 19 and resultant ARDS.  He was intubated 4/21-5/11. Reintubated and trached 5/12. Off vent and on Tbar since 5/21.  His course is complicated by acute kidney injury requiring CVVHD.  Pt transferred [facility] to [facility] on 5/26 and changed to intermittent HD.    OT comments  Pt making excellent progress towards OT goals. Pt was able to perform stand pivot transfers with minA+2, additional functional transfers with overall minA (+2 safety). Pt completing seated and standing grooming ADL at sink with overall minA for standing balance as well as for UE support due to continued RUE weakness. Pt continues to fatigue easily requiring intermittent seated rest breaks. Pt on 55% t-bar with SpO2 generally >90%. Occasional brief decr into 80's with activity which rebounds with sitting rest break; max RR into the low 40s. Feel pt remains an excellent candidate for CIR level therapy services at time of discharge. Will continue to follow acutely.   Follow Up Recommendations  CIR    Equipment Recommendations  3 in 1 bedside commode          Precautions / Restrictions Precautions Precautions: Fall Precaution Comments: R IJ HD catheter, on /trach/T bar, NGT Restrictions Weight Bearing Restrictions: No       Mobility Bed Mobility Overal bed mobility: Needs Assistance Bed Mobility: Supine to Sit;Sit to Supine     Supine to sit: Min assist;HOB elevated Sit to supine: Min assist;HOB elevated   General bed mobility comments: Assist to elevate trunk into sitting. Assist to bring legs back up into bed.  Transfers Overall transfer level: Needs assistance Equipment used: Rolling walker (2 wheeled);2 person hand held assist Transfers: Sit to/from [employer] Sit to Stand: Min assist;+2 safety/equipment Stand pivot transfers: Min assist;+2 physical assistance       General transfer comment: Assist to bring hips up and for safety. Verbal/tactile cues for hand placement. Bed to recliner with stand pivot with bil hand held    Balance Overall balance assessment: Needs assistance Sitting-balance support: Feet unsupported;No upper extremity supported Sitting balance-Leahy Scale: Fair     Standing balance support: Bilateral upper extremity supported Standing balance-Leahy Scale: Poor Standing balance comment: walker and min assist for static standing                           ADL either performed or assessed with clinical judgement   ADL Overall ADL's : Needs assistance/impaired     Grooming: Wash/dry face;Oral care;Min guard;Minimal assistance;Sitting;Standing Grooming Details (indicate cue type and reason): performed x2 grooming tasks seated in recliner; x1 in standing with minA for standing balance; pt fatigues easily; intermittently requires support of UE to reach higher towards back of head when washing hair due to UE weakness (especially RUE)                             Functional mobility during ADLs: Minimal assistance;+2 for safety/equipment General ADL Comments: pt continues to have decreased endurance and requires rest breaks but is making excellent progress     Vision       Perception     Praxis      Cognition Arousal/Alertness: Awake/alert Behavior During  Therapy: Flat affect Overall Cognitive Status: Difficult to assess                                 General Comments: With use of interpreter pt following commands consistently        Exercises     Shoulder Instructions       General Comments Pt on 55% t-bar with SpO2 generally >90%. Occasional brief decr into 80's with activity which rebounds with sitting rest break. max RR low 40s with activity     Pertinent Vitals/ Pain       Pain Assessment: No/denies pain  Home Living                                          Prior Functioning/Environment              Frequency  Min 3X/week        Progress Toward Goals  OT Goals(current goals can now be found in the care plan section)  Progress towards OT goals: Progressing toward goals  Acute Rehab OT Goals Patient Stated Goal: none stated OT Goal Formulation: With patient Time For Goal Achievement: 04/05/19 Potential to Achieve Goals: Good  Plan Discharge plan remains appropriate    Co-evaluation    PT/OT/SLP Co-Evaluation/Treatment: Yes Reason for Co-Treatment: Complexity of the patient's impairments (multi-system involvement);For patient/therapist safety;To address functional/ADL transfers PT goals addressed during session: Mobility/safety with mobility;Proper use of DME OT goals addressed during session: ADL's and self-care      AM-PAC OT "6 Clicks" Daily Activity     Outcome Measure   Help from another person eating meals?: Total Help from another person taking care of personal grooming?: A Lot Help from another person toileting, which includes using toliet, bedpan, or urinal?: Total Help from another person bathing (including washing, rinsing, drying)?: A Lot Help from another person to put on and taking off regular upper body clothing?: A Lot Help from another person to put on and taking off regular lower body clothing?: A Lot 6 Click Score: 10    End of Session Equipment Utilized During Treatment: Oxygen;Rolling walker  OT Visit Diagnosis: Other abnormalities of gait and mobility (R26.89);Muscle weakness (generalized) (M62.81);Pain Pain - Right/Left: Right Pain - part of body: Shoulder   Activity Tolerance Patient tolerated treatment well   Patient Left in bed;with call bell/phone within reach;with bed alarm set;with nursing/sitter in room(telesitter)   Nurse Communication Mobility  status        Time: 7741-2878 OT Time Calculation (min): 45 min  Charges: OT General Charges $OT Visit: 1 Visit OT Treatments $Self Care/Home Management : 23-37 mins  Lou Cal, OT Supplemental Rehabilitation Services Pager [phone] Office [phone]   Jonathan [name] 04/01/2019, 4:40 PM

## 2019-04-01 NOTE — Progress Notes (Signed)
ANTICOAGULATION CONSULT NOTE  Pharmacy Consult for heparin Indication: R/o PE   Not on File  Patient Measurements: Height: 5\' 5"  (165.1 cm) Weight: 131 lb 6.3 oz (59.6 kg) IBW/kg (Calculated) : 61.5  Vital Signs: Temp: 98.3 F (36.8 C) (05/27 2031) Temp Source: Oral (05/27 2031) BP: 118/81 (05/28 0435) Pulse Rate: 88 (05/28 0435)  Labs: Recent Labs    03/29/19 1643  03/30/19 0220 03/31/19 0818 03/31/19 1927  HGB  --   --  9.2*  --   --   HCT  --   --  29.6*  --   --   PLT  --   --  299  --   --   APTT  --   --  62*  --   --   HEPARINUNFRC  --    < > 0.36 <0.10* 0.16*  CREATININE 5.43*  --  3.61*  --   --    < > = values in this interval not displayed.   Estimated Creatinine Clearance: 21.1 mL/min (A) (by C-G formula based on SCr of 3.61 mg/dL (H)).  Medical History: No past medical history on file.  Medications:  No medications prior to admission.   Assessment: 55 YOM who was on IV heparin for an extended period of time and transitioned to apixaban on 5/23 for r/o PE. Pt then experienced a drop in Hgb on 5/24 requiring 2u PRBCs. Apixaban was stopped but now hgb remains stable with no s/s of overt bleeding. Pharmacy consulted to restart IV heparin.   Pt fell in room the evening of 5/26, heparin stopped at 2300. CT head negative and heparin resumed 5/27 ~ 0200.  5/27 am HL < 0.10, rate increased to 950 units/hr Rpt HL 5/27 pm was low at 0.16, mild oozing from trach site per RN, rate increased to 1150 units/hr  Goal of Therapy:  Heparin level 0.3-0.7 units/ml Monitor platelets by anticoagulation protocol: Yes   Today, 04/01/2019 0917 HL = 0.35 units/ml, in desired range No bleed from head laceration noted  Plan:  Continue Heparin infusion at 1150 units/hr  Recheck hep level at 1800 today  Daily CBC, heparin level  Minda Ditto PharmD [phone] Clinical Pharmacist Please check AMION for all [facility] Pharmacy numbers 04/01/2019

## 2019-04-01 NOTE — Progress Notes (Signed)
Left wrist restraint moved. Explained to pt if he started to mess with equipment he would be restrained again. He shook his head no.

## 2019-04-01 NOTE — Progress Notes (Signed)
Physical Therapy Treatment Patient Details Name: Jonathan [name] MRN: [id number] DOB: [birth year] Today's Date: 04/01/2019    History of Present Illness 48 y.o. male admitted on 02/23/19 with AMS, O2 sats in the ED were 50%.  Pt dx with COVID 19 and resultant ARDS.  He was intubated 4/21-5/11. Reintubated and trached 5/12. Off vent and on Tbar since 5/21.  His course is complicated by acute kidney injury requiring CVVHD.  Pt transferred back to [facility] on 5/26 and changed to intermittent HD.     PT Comments    Pt making excellent progress. Continue to feel pt could benefit from CIR. Interpreter Norma #  [phone] through video interpreting used  Follow Up Recommendations  CIR     Equipment Recommendations  Other (comment)(To be determined)    Recommendations for Other Services Rehab consult     Precautions / Restrictions Precautions Precautions: Fall Precaution Comments: R IJ HD catheter, on /trach/T bar, NGT    Mobility  Bed Mobility Overal bed mobility: Needs Assistance Bed Mobility: Supine to Sit;Sit to Supine     Supine to sit: Min assist;HOB elevated Sit to supine: Min assist;HOB elevated   General bed mobility comments: Assist to elevate trunk into sitting. Assist to bring legs back up into bed.  Transfers Overall transfer level: Needs assistance Equipment used: Rolling walker (2 wheeled);2 person hand held assist Transfers: Sit to/from [facility] Sit to Stand: Min assist;+2 safety/equipment Stand pivot transfers: Min assist;+2 physical assistance       General transfer comment: Assist to bring hips up and for safety. Verbal/tactile cues for hand placement. Bed to recliner with stand pivot with bil hand held  Ambulation/Gait Ambulation/Gait assistance: Min assist;+2 safety/equipment Gait Distance (Feet): 12 Feet Assistive device: Rolling walker (2 wheeled) Gait Pattern/deviations: Step-through pattern;Decreased step length -  right;Decreased step length - left;Narrow base of support Gait velocity: decr Gait velocity interpretation: <1.31 ft/sec, indicative of household ambulator General Gait Details: Assist for balance and support. Amb on tbar with SpO2 80% at end of amb. With sitting rest break returned to >90% in 1 minute   Stairs             Wheelchair Mobility    Modified Rankin (Stroke Patients Only)       Balance Overall balance assessment: Needs assistance Sitting-balance support: Feet unsupported;No upper extremity supported Sitting balance-Leahy Scale: Fair     Standing balance support: Bilateral upper extremity supported Standing balance-Leahy Scale: Poor Standing balance comment: walker and min assist for static standing                            Cognition Arousal/Alertness: Awake/alert Behavior During Therapy: Flat affect Overall Cognitive Status: Difficult to assess                                 General Comments: With use of interpreter pt following commands consistently      Exercises      General Comments General comments (skin integrity, edema, etc.): Pt on 55% t-bar with SpO2 generally >90%. Occasional brief decr into 80's with activity which rebounds with sitting rest break.      Pertinent Vitals/Pain Pain Assessment: No/denies pain    Home Living                      Prior Function  PT Goals (current goals can now be found in the care plan section) Progress towards PT goals: Progressing toward goals    Frequency    Min 3X/week      PT Plan Current plan remains appropriate    Co-evaluation PT/OT/SLP Co-Evaluation/Treatment: Yes Reason for Co-Treatment: Complexity of the patient's impairments (multi-system involvement);For patient/therapist safety PT goals addressed during session: Mobility/safety with mobility;Proper use of DME        AM-PAC PT "6 Clicks" Mobility   Outcome Measure  Help needed  turning from your back to your side while in a flat bed without using bedrails?: A Little Help needed moving from lying on your back to sitting on the side of a flat bed without using bedrails?: A Little Help needed moving to and from a bed to a chair (including a wheelchair)?: A Little Help needed standing up from a chair using your arms (e.g., wheelchair or bedside chair)?: A Little Help needed to walk in hospital room?: A Lot Help needed climbing 3-5 steps with a railing? : Total 6 Click Score: 15    End of Session Equipment Utilized During Treatment: Oxygen Activity Tolerance: Patient tolerated treatment well Patient left: in bed;with call bell/phone within reach   PT Visit Diagnosis: Muscle weakness (generalized) (M62.81);Difficulty in walking, not elsewhere classified (R26.2)     Time: 9826-4158 PT Time Calculation (min) (ACUTE ONLY): 45 min  Charges:  $Gait Training: 8-22 mins                     Beatrice Pager [phone] Office [place] 04/01/2019, 2:31 PM

## 2019-04-01 NOTE — Progress Notes (Signed)
Msg sent to Dr. Baltazar Najjar pt is completely out of restraints since 0300 and instructed not to mess with equipment including NGT

## 2019-04-01 NOTE — Progress Notes (Signed)
Subjective:  Labs back late, made 250 cc UOP yest recorded.  Not seen in room, have d/w [facility].   Objective Vital signs in last 24 hours: Vitals:   04/01/19 0800 04/01/19 0833 04/01/19 1142 04/01/19 1200  BP: 130/74   129/83  Pulse: 91 87 95 86  Resp: 20 19 (!) 21 17  Temp: 98.2 F (36.8 C)   98.6 F (37 C)  TempSrc: Oral   Oral  SpO2: 96% 99% 94% 99%  Weight:      Height:       Weight change:   Intake/Output Summary (Last 24 hours) at 04/01/2019 1333 Last data filed at 04/01/2019 0600 Gross per 24 hour  Intake 2468.59 ml  Output 250 ml  Net 2218.59 ml   Physical Exam:  Patient not examined directly given COVID-19 + status, utilizing exam of the primary team and observations of RN's.     CXR 5/17 diffuse stable infiltrates  CXR 5/21 no chg stable infiltrates  Assessment/ Plan: Pt is a 48 y.o. yo male who was admitted on 02/23/2019 with anuric AKI due to ATN in the setting of COVID   Assessment/Plan: 1. AKI-  On CRRT since 4/21. Made respiratory recovery w/ trach and taken off vent on 5/21. Now is stable on trach collar.  Made urine yest 5/27 250 cc 1st time in weeks. Has [facility] for dialysis.  - wt's stable - HD today - min UF 1-2L, keep SBP > 100 - keep vol up, avoid hypotension to encourage renal recovery - "renal" diet when able to eat  2. HTN/vol- normotensive  3. Anemia- hgb 6- 9 range. SP 2u prbc's on 5/24. Started darbe 100 ug /wk SQ 1st dose 5/23.  Cont to transfuse as needed. Tsat 9%. Hb up 9.2 today  4. ID-  covid positive. SP IV course of IV abx   5. VDRF- sp trach, off vent 5/21   [facility] 04/01/2019, 1:33 PM    Labs: Basic Metabolic Panel: Recent Labs  Lab 03/29/19 0500 03/29/19 1643 03/30/19 0220 04/01/19 0917  NA 135 135 136 135  K 5.6* 4.7 5.0 4.3  CL 97* 98 99 95*  CO2 24 27 29 29   GLUCOSE 123* 222* 108* 136*  BUN 124* 98* 69* 105*  CREATININE 6.81* 5.43* 3.61* 6.51*  CALCIUM 12.1* 10.6* 10.2 12.5*  PHOS  10.0* 6.4* 4.8*  --    Liver Function Tests: Recent Labs  Lab 03/29/19 0500 03/29/19 1643 03/30/19 0220  ALBUMIN 2.7* 2.8* 2.8*   No results for input(s): LIPASE, AMYLASE in the last 168 hours. No results for input(s): AMMONIA in the last 168 hours. CBC: Recent Labs  Lab 03/26/19 1045 03/27/19 0715 03/28/19 0514 03/28/19 2000 03/29/19 0500 03/30/19 0220  WBC 13.1* 16.2* 12.8*  --  11.3* 8.4  HGB 7.0* 7.3* 6.1* 7.4* 8.5* 9.2*  HCT 24.6* 24.5* 21.0* 24.0* 26.9* 29.6*  MCV 103.8* 102.1* 99.5  --  94.1 96.1  PLT 346 345 335  --  309 299   Cardiac Enzymes: No results for input(s): CKTOTAL, CKMB, CKMBINDEX, TROPONINI in the last 168 hours. CBG: Recent Labs  Lab 03/31/19 2031 04/01/19 0042 04/01/19 0409 04/01/19 0800 04/01/19 1227  GLUCAP 135* 107* 121* 99 168*    Iron Studies:  Recent Labs    04/01/19 0917  IRON 31*  TIBC 333   Studies/Results: Dg Abd 1 View  Result Date: 03/31/2019 CLINICAL DATA:  Check gastric catheter placement EXAM: ABDOMEN - 1  VIEW COMPARISON:  03/27/2019 FINDINGS: Gastric catheter is noted within the stomach. Proximal side port lies at the gastroesophageal junction. This could be advanced a few cm further into the stomach. IMPRESSION: Gastric catheter within the stomach although could be advanced further into the stomach. Electronically Signed   By: Inez Catalina M.D.   On: 03/31/2019 03:21   Ct Head Wo Contrast  Result Date: 03/31/2019 CLINICAL DATA:  Who is fall with forehead laceration, initial encounter EXAM: CT HEAD WITHOUT CONTRAST TECHNIQUE: Contiguous axial images were obtained from the base of the skull through the vertex without intravenous contrast. COMPARISON:  None. FINDINGS: Brain: No evidence of acute infarction, hemorrhage, hydrocephalus, extra-axial collection or mass lesion/mass effect. Vascular: No hyperdense vessel or unexpected calcification. Skull: Normal. Negative for fracture or focal lesion. Sinuses/Orbits: No acute  finding. Other: Mild soft tissue swelling is noted in the right forehead consistent with the recent injury. IMPRESSION: Soft tissue swelling in the right forehead consistent with the recent injury. No acute intracranial abnormality is noted. Electronically Signed   By: Inez Catalina M.D.   On: 03/31/2019 01:36   Dg Abd Portable 1v  Result Date: 03/31/2019 CLINICAL DATA:  Nasogastric placement EXAM: PORTABLE ABDOMEN - 1 VIEW COMPARISON:  Earlier same day FINDINGS: Two views show any is a gastric tube entering the stomach with its tip in the fundus. Contrast present throughout a normal appearing colon. IMPRESSION: Nasogastric tube tip in the gastric fundus. Electronically Signed   By: Nelson Chimes M.D.   On: 03/31/2019 12:19   Medications: Infusions: . sodium chloride    . sodium chloride Stopped (03/21/19 2128)  . heparin 1,150 Units/hr (04/01/19 0600)    Scheduled Medications: . chlorhexidine  15 mL Mouth Rinse BID  . Chlorhexidine Gluconate Cloth  6 each Topical Daily  . [START ON 04/02/2019] Chlorhexidine Gluconate Cloth  6 each Topical Q0600  . clonazepam  0.25 mg Per Tube Daily  . darbepoetin (ARANESP) injection - NON-DIALYSIS  100 mcg Subcutaneous Q Sat-1800  . famotidine  20 mg Per Tube Daily  . feeding supplement (NEPRO CARB STEADY)  1,000 mL Per Tube Q24H  . feeding supplement (PRO-STAT SUGAR FREE 64)  30 mL Per Tube TID  . free water  100 mL Per Tube Q8H  . guaiFENesin  5 mL Per Tube BID  . insulin aspart  0-15 Units Subcutaneous Q4H  . insulin aspart  6 Units Subcutaneous Q4H  . insulin glargine  10 Units Subcutaneous BID  . mouth rinse  15 mL Mouth Rinse q12n4p  . mupirocin cream   Topical BID  . oxyCODONE  5 mg Per Tube BID  . sodium chloride flush  10-40 mL Intracatheter Q12H  . [START ON 04/02/2019] sodium zirconium cyclosilicate  10 g Oral Daily    have reviewed scheduled and prn medications.

## 2019-04-02 ENCOUNTER — Inpatient Hospital Stay (HOSPITAL_COMMUNITY): Payer: HRSA Program

## 2019-04-02 DIAGNOSIS — Z9289 Personal history of other medical treatment: Secondary | ICD-10-CM

## 2019-04-02 LAB — CBC
HCT: 28 % — ABNORMAL LOW (ref 39.0–52.0)
Hemoglobin: 8.9 g/dL — ABNORMAL LOW (ref 13.0–17.0)
MCH: 30.8 pg (ref 26.0–34.0)
MCHC: 31.8 g/dL (ref 30.0–36.0)
MCV: 96.9 fL (ref 80.0–100.0)
Platelets: 295 10*3/uL (ref 150–400)
RBC: 2.89 MIL/uL — ABNORMAL LOW (ref 4.22–5.81)
RDW: 15.6 % — ABNORMAL HIGH (ref 11.5–15.5)
WBC: 8.5 10*3/uL (ref 4.0–10.5)
nRBC: 0 % (ref 0.0–0.2)

## 2019-04-02 LAB — HEPARIN LEVEL (UNFRACTIONATED): Heparin Unfractionated: 0.32 IU/mL (ref 0.30–0.70)

## 2019-04-02 LAB — BASIC METABOLIC PANEL WITH GFR
Anion gap: 13 (ref 5–15)
BUN: 82 mg/dL — ABNORMAL HIGH (ref 6–20)
CO2: 26 mmol/L (ref 22–32)
Calcium: 12.2 mg/dL — ABNORMAL HIGH (ref 8.9–10.3)
Chloride: 95 mmol/L — ABNORMAL LOW (ref 98–111)
Creatinine, Ser: 5.73 mg/dL — ABNORMAL HIGH (ref 0.61–1.24)
GFR calc Af Amer: 12 mL/min — ABNORMAL LOW
GFR calc non Af Amer: 11 mL/min — ABNORMAL LOW
Glucose, Bld: 133 mg/dL — ABNORMAL HIGH (ref 70–99)
Potassium: 4 mmol/L (ref 3.5–5.1)
Sodium: 134 mmol/L — ABNORMAL LOW (ref 135–145)

## 2019-04-02 LAB — ZINC: Zinc: 137 ug/dL — ABNORMAL HIGH (ref 56–134)

## 2019-04-02 LAB — GLUCOSE, CAPILLARY
Glucose-Capillary: 107 mg/dL — ABNORMAL HIGH (ref 70–99)
Glucose-Capillary: 110 mg/dL — ABNORMAL HIGH (ref 70–99)
Glucose-Capillary: 136 mg/dL — ABNORMAL HIGH (ref 70–99)
Glucose-Capillary: 148 mg/dL — ABNORMAL HIGH (ref 70–99)
Glucose-Capillary: 158 mg/dL — ABNORMAL HIGH (ref 70–99)
Glucose-Capillary: 93 mg/dL (ref 70–99)

## 2019-04-02 LAB — COPPER, SERUM: Copper: 93 ug/dL (ref 72–166)

## 2019-04-02 LAB — SARS CORONAVIRUS 2 BY RT PCR (HOSPITAL ORDER, PERFORMED IN ~~LOC~~ HOSPITAL LAB): SARS Coronavirus 2: NEGATIVE

## 2019-04-02 LAB — HEPATITIS B CORE ANTIBODY, TOTAL: Hep B Core Total Ab: NEGATIVE

## 2019-04-02 MED ORDER — MORPHINE SULFATE (PF) 2 MG/ML IV SOLN: 1.0000 mg | INTRAVENOUS | Status: DC | PRN

## 2019-04-02 MED ORDER — CLONAZEPAM 0.25 MG PO TBDP
0.2500 mg | ORAL_TABLET | Freq: Every day | ORAL | Status: DC
  Administered 2019-04-03 – 2019-04-19 (×17): 0.25 mg via ORAL
  Filled 2019-04-02 (×17): qty 1

## 2019-04-02 MED ORDER — OXYCODONE HCL 5 MG PO TABS: 5.0000 mg | ORAL_TABLET | Freq: Four times a day (QID) | ORAL | Status: DC | PRN

## 2019-04-02 MED ORDER — PRO-STAT SUGAR FREE PO LIQD
30.0000 mL | Freq: Three times a day (TID) | ORAL | Status: DC
  Administered 2019-04-03 – 2019-04-19 (×45): 30 mL via ORAL
  Filled 2019-04-02 (×44): qty 30

## 2019-04-02 MED ORDER — ACETAMINOPHEN 325 MG PO TABS
650.0000 mg | ORAL_TABLET | Freq: Four times a day (QID) | ORAL | Status: DC | PRN
  Administered 2019-04-05: 650 mg via ORAL
  Filled 2019-04-02: qty 2

## 2019-04-02 MED ORDER — FREE WATER
100.0000 mL | Freq: Three times a day (TID) | Status: DC
  Administered 2019-04-03 – 2019-04-10 (×10): 100 mL via ORAL

## 2019-04-02 MED ORDER — LIDOCAINE VISCOUS HCL 2 % MT SOLN
OROMUCOSAL | Status: AC
  Filled 2019-04-02: qty 15

## 2019-04-02 MED ORDER — FAMOTIDINE 20 MG PO TABS
20.0000 mg | ORAL_TABLET | Freq: Every day | ORAL | Status: DC
  Administered 2019-04-03 – 2019-04-10 (×8): 20 mg via ORAL
  Filled 2019-04-02 (×8): qty 1

## 2019-04-02 MED ORDER — OXYCODONE HCL 5 MG PO TABS
5.0000 mg | ORAL_TABLET | Freq: Two times a day (BID) | ORAL | Status: DC
  Administered 2019-04-03 – 2019-04-04 (×3): 5 mg via ORAL
  Filled 2019-04-02 (×3): qty 1

## 2019-04-02 MED ORDER — RESOURCE THICKENUP CLEAR PO POWD
ORAL | Status: DC | PRN
  Filled 2019-04-02 (×3): qty 125

## 2019-04-02 MED ORDER — NEPRO/CARBSTEADY PO LIQD
1000.0000 mL | ORAL | Status: DC
  Filled 2019-04-02 (×4): qty 1000

## 2019-04-02 MED ORDER — GUAIFENESIN 100 MG/5ML PO SOLN
5.0000 mL | Freq: Two times a day (BID) | ORAL | Status: DC
  Administered 2019-04-03 – 2019-04-19 (×32): 100 mg via ORAL
  Filled 2019-04-02 (×32): qty 5

## 2019-04-02 NOTE — Progress Notes (Signed)
Modified Barium Swallow Progress Note  Patient Details  Name: Jonathan [name] MRN: [id number] Date of Birth: 1971/07/23  Today's Date: 04/02/2019  Modified Barium Swallow completed.  Full report located under Chart Review in the Imaging Section.  Brief recommendations include the following:  Clinical Impression  Pt has a mild-moderate oropharyngeal dysphagia, with testing done without PMV in place as it has not been relocated s/p transfer to [facility]. Despite this, he demonstrates good airway protection with solids and nectar thick liquids. Thin liquids intermittently entered the airway in trace-mild amounts, but aspiration was silent. His swallow overall appeared mildly slowed and with reduced strenght. Lingual and vallecular residue was present, with pt performing a second swallow spontaneously to reduce residue in both locations. UES is tight with mild retrograde flow within the cervical esophagus that does not re-enter the pharynx during this study. Recommend starting with Dys 1 diet and nectar thick liquids. PMV not needed for intake, but SLP will f/u with pt to replace and continue to facilitate communication.    Swallow Evaluation Recommendations       SLP Diet Recommendations: Dysphagia 1 (Puree) solids;Nectar thick liquid   Liquid Administration via: Cup;Straw   Medication Administration: Crushed with puree   Supervision: Staff to assist with self feeding;Full supervision/cueing for compensatory strategies   Compensations: Slow rate;Small sips/bites;Multiple dry swallows after each bite/sip   Postural Changes: Seated upright at 90 degrees;Remain semi-upright after after feeds/meals (Comment)   Oral Care Recommendations: Oral care BID   Other Recommendations: Order thickener from pharmacy;Prohibited food (jello, ice cream, thin soups);Remove water pitcher    Venita Sheffield Hibah Odonnell 04/02/2019,4:41 PM   Pollyann Glen, M.A. [employer] Acute Environmental education officer  [phone] Office [phone]

## 2019-04-02 NOTE — Progress Notes (Signed)
Occupational Therapy Treatment Patient Details Name: Jonathan [name] MRN: [id number] DOB: 1971/03/20 Today's Date: 04/02/2019    History of present illness 48 y.o. male admitted on 02/23/19 with AMS, O2 sats in the ED were 50%.  Pt dx with COVID 19 and resultant ARDS.  He was intubated 4/21-5/11. Reintubated and trached 5/12. Off vent and on Tbar since 5/21.  His course is complicated by acute kidney injury requiring CVVHD.  Pt transferred back to [facility] on 5/26 and changed to intermittent HD.    OT comments  Pt continues to progress with therapies. Continues to have limitations due to fatigue and decreased activity tolerance but with good motivation to work with therapy. Pt performing short distance mobility in room using RW and minA (+2 safety). Performed seated and standing grooming ADL with minA throughout; pt taking seated rest breaks PRN. Pt continues to remain on on t-bar with lowest SpO2 noted into high 70s/low 80s with activity, quickly rebounds with seated rest and cues for deep breathing; maintaining >90% seated in recliner end of session. Feel he remains an excellent candidate for CIR. Will continue to follow acutely.  [employer] interpreter used during session: Reece Leader [phone]    Follow Up Recommendations  CIR    Equipment Recommendations  3 in 1 bedside commode          Precautions / Restrictions Precautions Precautions: Fall Precaution Comments: R IJ HD catheter, on /trach/T bar, NGT Restrictions Weight Bearing Restrictions: No       Mobility Bed Mobility Overal bed mobility: Needs Assistance Bed Mobility: Supine to Sit     Supine to sit: Min assist;HOB elevated     General bed mobility comments: Assist to elevate trunk into sitting, to ensure safety with lines  Transfers Overall transfer level: Needs assistance Equipment used: Rolling walker (2 wheeled);2 person hand held assist Transfers: Sit to/from [facility] Sit to Stand: Min  assist;+2 safety/equipment         General transfer comment: Assist to bring hips up and for safety. Verbal/tactile cues for hand placement.     Balance Overall balance assessment: Needs assistance Sitting-balance support: Feet unsupported;No upper extremity supported Sitting balance-Leahy Scale: Fair     Standing balance support: Bilateral upper extremity supported Standing balance-Leahy Scale: Poor Standing balance comment: walker and min assist for static standing                           ADL either performed or assessed with clinical judgement   ADL Overall ADL's : Needs assistance/impaired     Grooming: Wash/dry face;Oral care;Min guard;Minimal assistance;Sitting;Standing Grooming Details (indicate cue type and reason): performing in standing and sitting; minA for standing balance; assist to support RUE when reaching towards top of head and to ensure safety with trach                              Functional mobility during ADLs: Minimal assistance;+2 for safety/equipment General ADL Comments: pt continues to have decreased endurance and requires rest breaks but is making excellent progress     Vision       Perception     Praxis      Cognition Arousal/Alertness: Awake/alert Behavior During Therapy: Flat affect Overall Cognitive Status: Difficult to assess  General Comments: With use of interpreter pt following commands consistently        Exercises     Shoulder Instructions       General Comments Pt remains on t-bar with lowest SpO2 noted  briefly into the high 70s/low 80s, rebounds with seated rest and cues for deep breathing; generally maintaining at 86% and above     Pertinent Vitals/ Pain       Pain Assessment: No/denies pain  Home Living                                          Prior Functioning/Environment              Frequency  Min 3X/week         Progress Toward Goals  OT Goals(current goals can now be found in the care plan section)  Progress towards OT goals: Progressing toward goals  Acute Rehab OT Goals Patient Stated Goal: none stated OT Goal Formulation: With patient Time For Goal Achievement: 04/05/19 Potential to Achieve Goals: Good  Plan Discharge plan remains appropriate    Co-evaluation    PT/OT/SLP Co-Evaluation/Treatment: Yes Reason for Co-Treatment: Complexity of the patient's impairments (multi-system involvement);For patient/therapist safety;To address functional/ADL transfers   OT goals addressed during session: ADL's and self-care      AM-PAC OT "6 Clicks" Daily Activity     Outcome Measure   Help from another person eating meals?: Total Help from another person taking care of personal grooming?: A Lot Help from another person toileting, which includes using toliet, bedpan, or urinal?: A Lot Help from another person bathing (including washing, rinsing, drying)?: A Lot Help from another person to put on and taking off regular upper body clothing?: A Lot Help from another person to put on and taking off regular lower body clothing?: A Lot 6 Click Score: 11    End of Session Equipment Utilized During Treatment: Oxygen;Rolling walker  OT Visit Diagnosis: Other abnormalities of gait and mobility (R26.89);Muscle weakness (generalized) (M62.81);Pain Pain - Right/Left: Right Pain - part of body: Shoulder   Activity Tolerance Patient tolerated treatment well   Patient Left in bed;with call bell/phone within reach;with bed alarm set;with nursing/sitter in room(telesitter)   Nurse Communication Mobility status        Time: 5364-6803 OT Time Calculation (min): 45 min  Charges: OT General Charges $OT Visit: 1 Visit OT Treatments $Self Care/Home Management : 8-22 mins  Lou Cal, [place] Pager [phone] Office [place] 04/02/2019, 1:02 PM

## 2019-04-02 NOTE — Progress Notes (Signed)
ANTICOAGULATION CONSULT NOTE  Pharmacy Consult for heparin Indication: R/o PE   Not on File  Patient Measurements: Height: 5\' 5"  (165.1 cm) Weight: 139 lb 1.8 oz (63.1 kg) IBW/kg (Calculated) : 61.5  Vital Signs: Temp: 98.6 F (37 C) (05/29 0342) Temp Source: Oral (05/29 0342) BP: 128/85 (05/29 0342) Pulse Rate: 115 (05/29 0342)  Labs: Recent Labs    03/31/19 1927 04/01/19 0917 04/01/19 1835  HEPARINUNFRC 0.16* 0.35 0.35  CREATININE  --  6.51*  --    Estimated Creatinine Clearance: 12.1 mL/min (A) (by C-G formula based on SCr of 6.51 mg/dL (H)).  Medical History: No past medical history on file.  Medications:  No medications prior to admission.   Assessment: 26 YOM who was on IV heparin for an extended period of time and transitioned to apixaban on 5/23 for r/o PE. Pt then experienced a drop in Hgb on 5/24 requiring 2u PRBCs. Apixaban was stopped but now hgb remains stable with no s/s of overt bleeding. Pharmacy consulted to restart IV heparin.   Pt fell in room the evening of 5/26, heparin stopped at 2300. CT head negative and heparin resumed 5/27 ~ 0200.  5/27 am HL < 0.10, rate increased to 950 units/hr Rpt HL 5/27 pm was low at 0.16, mild oozing from trach site per RN, rate increased to 1150 units/hr  Goal of Therapy:  Heparin level 0.3-0.7 units/ml Monitor platelets by anticoagulation protocol: Yes   Today, 04/02/2019 0917 HL = 0.32 units/ml on 1150 units/hr, in desired range No bleed from head laceration noted  Plan:  Continue Heparin infusion at 1150 units/hr  Daily CBC, heparin level  Minda Ditto PharmD [phone] Clinical Pharmacist Please check AMION for all [facility] Pharmacy numbers 04/02/2019

## 2019-04-02 NOTE — Progress Notes (Signed)
PROGRESS NOTE  Jonathan [name] GBT:[id number] DOB: [birth year] DOA: 02/23/2019  PCP: Patient, No Pcp Per  Brief History/Interval Summary:  - 48 y.o. male [place] speaking, arrived from [place] on 02/13/2019 and was living in close quarters with 6 other workers. He developed increasing rrespiratory distress and was intubated for ARDS due to COVID-19 infection. He was started on CRRT due to ATN, paralyzed and prone. He was transferred to [place] for further management. He was eventually extubated, but quickly became short of breath again and aspirated when he started vomiting. He was re intubated and ended up with tracheostomy.   Events: 02/23/2019 admitted to the ICU. 02/24/2019 started on CRRT 4/21-4/24 Proned daily 4/28 -4/30 proned daily  03/04/2019 increase in oxygen demand require paralytic for 24 hours. 03/09/2019 hematochezia from gastric aspirat 5/11 extubated 5/12 re-intubated, tracheostomy 5/13 perm cath 5/14 worsening hypoxemia, shock suddenly, RV dilated, no new CXR changes, treated for PE empirically 5/21 doing t-piece trials 5/23: Trial off of CVVHD 5/24: 2 units PRBC 5/25 remains off of ventilator support since May 21, remains anuric, 5/26  5/26 patient will be transferred to [facility] as he will need intermittent hemodialysis which is not available at [facility].  Lines and tubes: 4/21 - ETT >>5.11.2020 4/21 - RIJ CVC 4/22 L IJ HD Cath> 5/13 Permacath right  Antibiotics: 02/23/2019-425 2020 azithromycin and Rocephin 02/23/2019-02/24/2019 03/06/2019 Rocephin 5/11-5/17 Vancomycin 5/12-5/18 Zosyn 5/12-5/17 Fluconazole for candida in tracheal aspirate and recent Actemra  Significant diagnostic Tests; POC U/S 4/21: Bilateral interstitial pattern with bilateral dense consolidation at bases Echo shows low-normal LVSF. RV moderately dilated with septal shift.  5/14 Echo> LVEF > 65%, RV dilated, decreased RV function (moderate) with RVSP 60  Subjective/Interval  History:   He is getting stronger, getting up to chair, cough now is getting strong, can cough up and get oral suction instead of frequent deep suction Feeding tube dislodged, needs to be replaced  Need CIR eval, need repeat Covid 19 testing which is ordered  Assessment/Plan:  Acute Hypoxic Resp. Failure due to Acute Covid 19 Viral Illness/ARDS  - Patient has been stable from a respiratory standpoint.  Weaned off the vent 5/21, no vent requirement since,  currently on trach T-piece. -Received steroids initially. -Received Actemra initially 4/22 -received convulsant plasma 5/3 -- Volume management with CRRT >> be transitioned to hemodialysis soon, to monitor volume status closely. -SLP following could not tolerate PMV or diet yet, pulmonology following for trach care and downsizing  The night after transferred from [facility] to [place] , he fell last night due to trying to get up, he denies confusion, there was no agitation He lost iv access and ng due to the fall, now all replaced, he is tolerating tube feeds and iv heparin, abrasion due to fall , no active bleed     Presumed acute PE - Patient had an acute episode of hypoxia with respiratory decompensation.  Echocardiogram showed right-sided strain.  Patient was empiricaly placed on IV heparin due to concern for underlying pulmonary embolism.  Lower extremity Doppler studies negative.  3 months of anticoagulation recommended.  -Was at one point, but all forms of anticoagulation were held given a drop of his hemoglobin, now currently stable, no significant, he is back on heparin GTT as of 5/25, will transition to Eliquis once stable.  Septic shock - Patient has been weaned off of pressors.  Acute kidney injury due to ATN - Patient remains on CRRT.  Nephrology is following and managing.  No signs of renal recovery.  No urine output . - As tolerated 2 days off CRRT, discussed with renal, patient is ready to transition to intermittent  hemodialysis, he received CRRT for last 24 hours,  will continue CRRT for next 24 hours, then will transfer to: For intermittent hemodialysis.  Anemia due to chronic illness and possible acute GI blood loss -No evidance  of significant GI bleed, likely in the setting of suctioning, this has resolved . -Received 2 units PRBC 03/28/2019, hemoglobin stable 9.2 at  this morning .  Leukocytosis Patient remains afebrile.  WBC is improving.  Stool studies were negative for C. difficile a few days ago.  Continue to monitor.     Diabetes mellitus type 2 Patient on basal insulin as well as sliding scale coverage.  HbA1c 5.5.    FEN On CRRT.  Monitor electrolytes.  Labs are pending from this morning.  Continue tube feedings.    Pressure injury on the skin of nose Local wound care. May need plastic surgery consult if patient agrees to   DVT Prophylaxis: On heparin infusion  Lab Results  Component Value  following, not tolerate PMV yet   PLT 345 03/27/2019     PUD Prophylaxis: On famotidine Code Status: Partial code Family Communication:  Wife Energy manager number [phone].  Discussing with family on daily basis via interpreter (Interpreter number is [phone]. If you call her she will connect you to family and interpret for you) disposition Plan: needs trach downsizing, speech eval,  He is currently on tube feeds, LTAC vs snf placement    Consultants: Pulmonology.  Nephrology.     Medications:  Scheduled: . chlorhexidine  15 mL Mouth Rinse BID  . Chlorhexidine Gluconate Cloth  6 each Topical Daily  . clonazepam  0.25 mg Per Tube Daily  . darbepoetin (ARANESP) injection - NON-DIALYSIS  100 mcg Subcutaneous Q Sat-1800  . famotidine  20 mg Per Tube Daily  . feeding supplement (NEPRO CARB STEADY)  1,000 mL Per Tube Q24H  . feeding supplement (PRO-STAT SUGAR FREE 64)  30 mL Per Tube TID  . free water  100 mL Per Tube Q8H  . guaiFENesin  5 mL Per Tube  BID  . insulin aspart  0-15 Units Subcutaneous Q4H  . insulin aspart  6 Units Subcutaneous Q4H  . insulin glargine  10 Units Subcutaneous BID  . mouth rinse  15 mL Mouth Rinse q12n4p  . mupirocin cream   Topical BID  . oxyCODONE  5 mg Per Tube BID  . sodium chloride flush  10-40 mL Intracatheter Q12H  . sodium zirconium cyclosilicate  10 g Oral Daily   Continuous: . sodium chloride    . sodium chloride Stopped (03/21/19 2128)  . heparin 1,150 Units/hr (04/02/19 0415)   OFB:PZWCH/ENIDPOEU arterial line **AND** sodium chloride, sodium chloride, acetaminophen (TYLENOL) oral liquid 160 mg/5 mL, glycopyrrolate, heparin, lip balm, metoprolol tartrate, ondansetron (ZOFRAN) IV, oxyCODONE, sodium chloride flush   Objective:  Vital Signs  Vitals:   04/02/19 0210 04/02/19 0310 04/02/19 0318 04/02/19 0342  BP: 121/81 129/89  128/85  Pulse: 90 96 97 (!) 115  Resp: (!) 21 (!) 24 (!) 28 (!) 31  Temp:    98.6 F (37 C)  TempSrc:    Oral  SpO2: 94% 97% 96% (!) 64%  Weight:    63.1 kg  Height:        Intake/Output Summary (Last 24 hours) at 04/02/2019 0715 Last data filed at 04/01/2019 1900 Gross  per 24 hour  Intake 463.46 ml  Output 2100 ml  Net -1636.54 ml   Filed Weights   04/01/19 1440 04/01/19 1750 04/02/19 0342  Weight: 63.8 kg 61.8 kg 63.1 kg   Awake, alert, in bed, in no apparent distress  trach in place,   Good air movement bilaterally, CTAB RRR,No Gallops,Rubs or new Murmurs, +ve B.Sounds, Abd Soft, No tenderness, No rebound - guarding or rigidity. No Cyanosis, Clubbing or edema     Lab Results:  Data Reviewed: I have personally reviewed following labs and imaging studies  CBC: Recent Labs  Lab 03/26/19 1045 03/27/19 0715 03/28/19 0514 03/28/19 2000 03/29/19 0500 03/30/19 0220  WBC 13.1* 16.2* 12.8*  --  11.3* 8.4  HGB 7.0* 7.3* 6.1* 7.4* 8.5* 9.2*  HCT 24.6* 24.5* 21.0* 24.0* 26.9* 29.6*  MCV 103.8* 102.1* 99.5  --  94.1 96.1  PLT 346 345 335  --  309  491    Basic Metabolic Panel: Recent Labs  Lab 03/26/19 1045  03/27/19 0715 03/28/19 0514 03/28/19 2000 03/29/19 0500 03/29/19 1643 03/30/19 0220 04/01/19 0917  NA 135   < > 134* 134* 133* 135 135 136 135  K 5.5*   < > 5.0 5.4* 5.6* 5.6* 4.7 5.0 4.3  CL 101   < > 98 98 97* 97* 98 99 95*  CO2 27   < > 29 25 24 24 27 29 29   GLUCOSE 124*   < > 149* 135* 151* 123* 222* 108* 136*  BUN 30*   < > 32* 87* 114* 124* 98* 69* 105*  CREATININE 1.83*   < > 1.94* 4.37* 5.85* 6.81* 5.43* 3.61* 6.51*  CALCIUM 10.0   < > 10.3 11.7* 11.8* 12.1* 10.6* 10.2 12.5*  MG 3.0*  --  3.1*  --   --   --   --  3.1*  --   PHOS 2.9   < > 3.0 7.3* 9.0* 10.0* 6.4* 4.8*  --    < > = values in this interval not displayed.    GFR: Estimated Creatinine Clearance: 12.1 mL/min (A) (by C-G formula based on SCr of 6.51 mg/dL (H)).  Liver Function Tests: Recent Labs  Lab 03/28/19 0514 03/28/19 2000 03/29/19 0500 03/29/19 1643 03/30/19 0220  ALBUMIN 2.8* 2.8* 2.7* 2.8* 2.8*    CBG: Recent Labs  Lab 04/01/19 1227 04/01/19 1535 04/01/19 1927 04/01/19 2310 04/02/19 0338  GLUCAP 168* 92 151* 119* 136*     No results found for this or any previous visit (from the past 240 hour(s)).    Radiology Studies: Dg Abd Portable 1v  Result Date: 03/31/2019 CLINICAL DATA:  Nasogastric placement EXAM: PORTABLE ABDOMEN - 1 VIEW COMPARISON:  Earlier same day FINDINGS: Two views show any is a gastric tube entering the stomach with its tip in the fundus. Contrast present throughout a normal appearing colon. IMPRESSION: Nasogastric tube tip in the gastric fundus. Electronically Signed   By: Nelson Chimes M.D.   On: 03/31/2019 12:19       LOS: 51 days   Florencia Reasons MD PhD  [facility] Pager on [email or web address]  04/02/2019, 7:15 AM

## 2019-04-02 NOTE — Progress Notes (Signed)
Physical Therapy Treatment Patient Details Name: Jonathan [name] MRN: [id number] DOB: 15-Nov-1970 Today's Date: 04/02/2019    History of Present Illness 48 y.o. male admitted on 02/23/19 with AMS, O2 sats in the ED were 50%.  Pt dx with COVID 19 and resultant ARDS.  He was intubated 4/21-5/11. Reintubated and trached 5/12. Off vent and on Tbar since 5/21.  His course is complicated by acute kidney injury requiring CVVHD.  Pt transferred back to [facility] on 5/26 and changed to intermittent HD.     PT Comments    Pt continues to make good progress. Continue to recommend CIR if meets criteria. Pt remains motivated to work toward independence. Video interpreter Sol Blazing # [phone] used.   Follow Up Recommendations  CIR     Equipment Recommendations  Other (comment)(TBD)    Recommendations for Other Services       Precautions / Restrictions Precautions Precautions: Fall Precaution Comments: R IJ HD catheter, on /trach/T bar, NGT Restrictions Weight Bearing Restrictions: No    Mobility  Bed Mobility Overal bed mobility: Needs Assistance Bed Mobility: Supine to Sit     Supine to sit: Min assist;HOB elevated     General bed mobility comments: Assist to elevate trunk into sitting, to ensure safety with lines  Transfers Overall transfer level: Needs assistance Equipment used: Rolling walker (2 wheeled);2 person hand held assist Transfers: Sit to/from Stand Sit to Stand: Min assist;+2 safety/equipment         General transfer comment: Assist to bring hips up and for safety. Verbal/tactile cues for hand placement.   Ambulation/Gait Ambulation/Gait assistance: Min assist;+2 safety/equipment Gait Distance (Feet): 10 Feet Assistive device: Rolling walker (2 wheeled) Gait Pattern/deviations: Step-through pattern;Decreased step length - right;Decreased step length - left;Narrow base of support Gait velocity: decr Gait velocity interpretation: <1.31 ft/sec, indicative of  household ambulator General Gait Details: Assist for balance and support as well as lines/tubes. Pt fatigues quickly and required sitting rest break. Amb on T-bar at 55% with SpO2 dipping to low 80s with amb and then to upper 70's with extended standing at sink. Able to recover with sitting back to >90 with several minutes.   Stairs             Wheelchair Mobility    Modified Rankin (Stroke Patients Only)       Balance Overall balance assessment: Needs assistance Sitting-balance support: Feet unsupported;No upper extremity supported Sitting balance-Leahy Scale: Fair     Standing balance support: Bilateral upper extremity supported Standing balance-Leahy Scale: Poor Standing balance comment: walker and min assist for static standing                            Cognition Arousal/Alertness: Awake/alert Behavior During Therapy: Flat affect Overall Cognitive Status: Difficult to assess                                 General Comments: With use of interpreter pt following commands consistently      Exercises      General Comments General comments (skin integrity, edema, etc.): Pt remains on t-bar with lowest SpO2 noted  briefly into the high 70s/low 80s, rebounds with seated rest and cues for deep breathing; generally maintaining at 86% and above       Pertinent Vitals/Pain Pain Assessment: No/denies pain    Home Living  Prior Function            PT Goals (current goals can now be found in the care plan section) Acute Rehab PT Goals Patient Stated Goal: none stated Progress towards PT goals: Progressing toward goals    Frequency    Min 3X/week      PT Plan Current plan remains appropriate    Co-evaluation   Reason for Co-Treatment: Complexity of the patient's impairments (multi-system involvement);For patient/therapist safety;To address functional/ADL transfers   OT goals addressed during  session: ADL's and self-care      AM-PAC PT "6 Clicks" Mobility   Outcome Measure  Help needed turning from your back to your side while in a flat bed without using bedrails?: A Little Help needed moving from lying on your back to sitting on the side of a flat bed without using bedrails?: A Little Help needed moving to and from a bed to a chair (including a wheelchair)?: A Little Help needed standing up from a chair using your arms (e.g., wheelchair or bedside chair)?: A Little Help needed to walk in hospital room?: A Lot Help needed climbing 3-5 steps with a railing? : Total 6 Click Score: 15    End of Session Equipment Utilized During Treatment: Oxygen Activity Tolerance: Patient tolerated treatment well Patient left: in chair;with call bell/phone within reach;with chair alarm set Nurse Communication: Mobility status PT Visit Diagnosis: Muscle weakness (generalized) (M62.81);Difficulty in walking, not elsewhere classified (R26.2)     Time: 1117-3567 PT Time Calculation (min) (ACUTE ONLY): 40 min  Charges:  $Gait Training: 8-22 mins $Therapeutic Activity: 8-22 mins                     [place] Pager [phone] Office [place] 04/02/2019, 1:55 PM

## 2019-04-02 NOTE — Progress Notes (Signed)
Subjective:  RN notes pt has voided some in with a BM once and into the sheets another time. Pt w/o c/o's.   Objective Vital signs in last 24 hours: Vitals:   04/02/19 0808 04/02/19 0954 04/02/19 1120 04/02/19 1200  BP: (!) 130/93   (!) 138/98  Pulse: 96 (!) 111 100 87  Resp: (!) 31 (!) 37 19 17  Temp: 98.5 F (36.9 C)   (!) 97.5 F (36.4 C)  TempSrc: Oral   Oral  SpO2: 96% (!) 88% 95% 98%  Weight:      Height:       Weight change:   Intake/Output Summary (Last 24 hours) at 04/02/2019 1254 Last data filed at 04/01/2019 1900 Gross per 24 hour  Intake 463.46 ml  Output 2100 ml  Net -1636.54 ml   Physical Exam: Trach, NG tube , R chest HD cath, seen in room, no distress Chest cta bilat Cor reg Abd soft ntnd no ascites  Ext no edema LE's or UE's RIJ TDC   CXR 5/17 diffuse stable infiltrates  CXR 5/21 no chg stable infiltrates  Assessment/ Plan: Pt is a 48 y.o. yo male who was admitted on 02/23/2019 with anuric AKI due to ATN in the setting of COVID   Assessment/Plan: 1. AKI-  On CRRT since 4/21. Made respiratory recovery w/ trach and taken off vent on 5/21. Now is stable on trach collar.  Made urine yest 5/27 250 cc 1st time in weeks. Has [facility] for dialysis.  - wt's stable - HD today, min 1-2 L UF - keep vol up, avoid hypotension to encourage renal recovery - "renal" diet when able to eat - bladder scan daily  2. HTN/vol- normotensive  3. Anemia- hgb 6- 9 range. SP 2u prbc's on 5/24. Started darbe 100 ug /wk SQ 1st dose 5/23.  Cont to transfuse as needed. Tsat 9%. Hb up 9.2 today  4. ID-  covid positive. SP IV course of IV abx   5. VDRF- sp trach, off vent 5/21   [facility] 04/02/2019, 12:54 PM    Labs: Basic Metabolic Panel: Recent Labs  Lab 03/29/19 0500 03/29/19 1643 03/30/19 0220 04/01/19 0917  NA 135 135 136 135  K 5.6* 4.7 5.0 4.3  CL 97* 98 99 95*  CO2 24 27 29 29   GLUCOSE 123* 222* 108* 136*  BUN 124* 98* 69* 105*   CREATININE 6.81* 5.43* 3.61* 6.51*  CALCIUM 12.1* 10.6* 10.2 12.5*  PHOS 10.0* 6.4* 4.8*  --    Liver Function Tests: Recent Labs  Lab 03/29/19 0500 03/29/19 1643 03/30/19 0220  ALBUMIN 2.7* 2.8* 2.8*   No results for input(s): LIPASE, AMYLASE in the last 168 hours. No results for input(s): AMMONIA in the last 168 hours. CBC: Recent Labs  Lab 03/27/19 0715 03/28/19 0514  03/29/19 0500 03/30/19 0220 04/02/19 0707  WBC 16.2* 12.8*  --  11.3* 8.4 8.5  HGB 7.3* 6.1*   < > 8.5* 9.2* 8.9*  HCT 24.5* 21.0*   < > 26.9* 29.6* 28.0*  MCV 102.1* 99.5  --  94.1 96.1 96.9  PLT 345 335  --  309 299 295   < > = values in this interval not displayed.   Cardiac Enzymes: No results for input(s): CKTOTAL, CKMB, CKMBINDEX, TROPONINI in the last 168 hours. CBG: Recent Labs  Lab 04/01/19 1927 04/01/19 2310 04/02/19 0338 04/02/19 0809 04/02/19 1204  GLUCAP 151* 119* 136* 93 148*    Iron Studies:  Recent Labs    04/01/19 0917  IRON 31*  TIBC 333   Studies/Results: No results found. Medications: Infusions: . sodium chloride    . sodium chloride Stopped (03/21/19 2128)  . heparin 1,150 Units/hr (04/02/19 0415)    Scheduled Medications: . chlorhexidine  15 mL Mouth Rinse BID  . Chlorhexidine Gluconate Cloth  6 each Topical Daily  . clonazepam  0.25 mg Per Tube Daily  . darbepoetin (ARANESP) injection - NON-DIALYSIS  100 mcg Subcutaneous Q Sat-1800  . famotidine  20 mg Per Tube Daily  . feeding supplement (NEPRO CARB STEADY)  1,000 mL Per Tube Q24H  . feeding supplement (PRO-STAT SUGAR FREE 64)  30 mL Per Tube TID  . free water  100 mL Per Tube Q8H  . guaiFENesin  5 mL Per Tube BID  . insulin aspart  0-15 Units Subcutaneous Q4H  . insulin aspart  6 Units Subcutaneous Q4H  . insulin glargine  10 Units Subcutaneous BID  . mouth rinse  15 mL Mouth Rinse q12n4p  . mupirocin cream   Topical BID  . oxyCODONE  5 mg Per Tube BID  . sodium chloride flush  10-40 mL Intracatheter  Q12H  . sodium zirconium cyclosilicate  10 g Oral Daily    have reviewed scheduled and prn medications.

## 2019-04-02 NOTE — Progress Notes (Signed)
SLP Note:  Per discussion with nursing and radiology: pt to be going to fluoroscopy to have NGT replaced. COVID swab today was negative. Will proceed with MBS prior to replacing NGT again.  Pollyann Glen, M.A. [place] Acute Environmental education officer [phone] Office [phone]

## 2019-04-02 NOTE — Progress Notes (Addendum)
Inpatient Rehabilitation Admissions Coordinator  Inpatient rehab consult received. Reviewed chart remotely. Note that pt's most recent (+) test was on 02/23/2019 on admit.  Will continue to follow at a distance until patient meets current CDC guidelines for discharge to a facility.  Please call me with any questions. I updated RN CM, Neoma Laming, of my review.  Danne Baxter, RN, MSN Rehab Admissions Coordinator [phone] 04/02/2019 8:36 AM

## 2019-04-03 ENCOUNTER — Other Ambulatory Visit: Payer: Self-pay

## 2019-04-03 ENCOUNTER — Inpatient Hospital Stay (HOSPITAL_COMMUNITY): Payer: HRSA Program

## 2019-04-03 ENCOUNTER — Encounter (HOSPITAL_COMMUNITY): Payer: Self-pay

## 2019-04-03 LAB — CBC
HCT: 28.1 % — ABNORMAL LOW (ref 39.0–52.0)
Hemoglobin: 8.9 g/dL — ABNORMAL LOW (ref 13.0–17.0)
MCH: 30.1 pg (ref 26.0–34.0)
MCHC: 31.7 g/dL (ref 30.0–36.0)
MCV: 94.9 fL (ref 80.0–100.0)
Platelets: 323 10*3/uL (ref 150–400)
RBC: 2.96 MIL/uL — ABNORMAL LOW (ref 4.22–5.81)
RDW: 15.2 % (ref 11.5–15.5)
WBC: 8 10*3/uL (ref 4.0–10.5)
nRBC: 0 % (ref 0.0–0.2)

## 2019-04-03 LAB — GLUCOSE, CAPILLARY
Glucose-Capillary: 100 mg/dL — ABNORMAL HIGH (ref 70–99)
Glucose-Capillary: 109 mg/dL — ABNORMAL HIGH (ref 70–99)
Glucose-Capillary: 111 mg/dL — ABNORMAL HIGH (ref 70–99)
Glucose-Capillary: 113 mg/dL — ABNORMAL HIGH (ref 70–99)
Glucose-Capillary: 118 mg/dL — ABNORMAL HIGH (ref 70–99)
Glucose-Capillary: 145 mg/dL — ABNORMAL HIGH (ref 70–99)
Glucose-Capillary: 163 mg/dL — ABNORMAL HIGH (ref 70–99)

## 2019-04-03 LAB — BASIC METABOLIC PANEL
Anion gap: 17 — ABNORMAL HIGH (ref 5–15)
BUN: 106 mg/dL — ABNORMAL HIGH (ref 6–20)
CO2: 24 mmol/L (ref 22–32)
Calcium: 13.1 mg/dL (ref 8.9–10.3)
Chloride: 95 mmol/L — ABNORMAL LOW (ref 98–111)
Creatinine, Ser: 6.53 mg/dL — ABNORMAL HIGH (ref 0.61–1.24)
GFR calc Af Amer: 11 mL/min — ABNORMAL LOW (ref >60)
GFR calc non Af Amer: 9 mL/min — ABNORMAL LOW (ref >60)
Glucose, Bld: 113 mg/dL — ABNORMAL HIGH (ref 70–99)
Potassium: 4.2 mmol/L (ref 3.5–5.1)
Sodium: 136 mmol/L (ref 135–145)

## 2019-04-03 LAB — HEPARIN LEVEL (UNFRACTIONATED): Heparin Unfractionated: 0.4 IU/mL (ref 0.30–0.70)

## 2019-04-03 LAB — SARS CORONAVIRUS 2 BY RT PCR (HOSPITAL ORDER, PERFORMED IN ~~LOC~~ HOSPITAL LAB): SARS Coronavirus 2: NEGATIVE

## 2019-04-03 MED ORDER — DEXTROSE 50 % IV SOLN
INTRAVENOUS | Status: AC
  Filled 2019-04-03: qty 50

## 2019-04-03 MED ORDER — HEPARIN SODIUM (PORCINE) 1000 UNIT/ML IJ SOLN
3400.0000 [IU] | Freq: Once | INTRAMUSCULAR | Status: AC
  Administered 2019-04-03: 3400 [IU] via INTRAVENOUS

## 2019-04-03 MED ORDER — ENOXAPARIN SODIUM 60 MG/0.6ML ~~LOC~~ SOLN
60.0000 mg | SUBCUTANEOUS | Status: DC
  Administered 2019-04-03 – 2019-04-07 (×5): 60 mg via SUBCUTANEOUS
  Filled 2019-04-03 (×6): qty 0.6

## 2019-04-03 MED ORDER — CHLORHEXIDINE GLUCONATE CLOTH 2 % EX PADS
6.0000 | MEDICATED_PAD | Freq: Every day | CUTANEOUS | Status: DC
  Administered 2019-04-03 – 2019-04-04 (×2): 6 via TOPICAL

## 2019-04-03 MED ORDER — PNEUMOCOCCAL VAC POLYVALENT 25 MCG/0.5ML IJ INJ
0.5000 mL | INJECTION | INTRAMUSCULAR | Status: AC
  Administered 2019-04-04: 0.5 mL via INTRAMUSCULAR
  Filled 2019-04-03: qty 0.5

## 2019-04-03 MED ORDER — CALCITONIN (SALMON) 200 UNIT/ML IJ SOLN
4.0000 [IU]/kg | Freq: Two times a day (BID) | INTRAMUSCULAR | Status: AC
  Administered 2019-04-03 – 2019-04-04 (×4): 252 [IU] via SUBCUTANEOUS
  Filled 2019-04-03 (×4): qty 1.26

## 2019-04-03 MED ORDER — GLUCAGON HCL RDNA (DIAGNOSTIC) 1 MG IJ SOLR
INTRAMUSCULAR | Status: AC
  Filled 2019-04-03: qty 1

## 2019-04-03 NOTE — Progress Notes (Signed)
Pt desat in 844-85%. Pt suctioned, airway cleared, moderate thick secretions noted.

## 2019-04-03 NOTE — Progress Notes (Signed)
PROGRESS NOTE  Jonathan [name] AOZ:[id number] DOB: [birth year] DOA: 02/23/2019  PCP: Patient, No Pcp Per  Brief History/Interval Summary:  - 48 y.o. male [place] speaking, arrived from [place] on 02/13/2019 and was living in close quarters with 6 other workers. He developed increasing rrespiratory distress and was intubated for ARDS due to COVID-19 infection. He was started on CRRT due to ATN, paralyzed and prone. He was transferred to [place] for further management. He was eventually extubated, but quickly became short of breath again and aspirated when he started vomiting. He was re intubated and ended up with tracheostomy.   Events: 02/23/2019 admitted to the ICU. 02/24/2019 started on CRRT 4/21-4/24 Proned daily 4/28 -4/30 proned daily  03/04/2019 increase in oxygen demand require paralytic for 24 hours. 03/09/2019 hematochezia from gastric aspirat 5/11 extubated 5/12 re-intubated, tracheostomy 5/13 perm cath 5/14 worsening hypoxemia, shock suddenly, RV dilated, no new CXR changes, treated for PE empirically 5/21 doing t-piece trials 5/23: Trial off of CVVHD 5/24: 2 units PRBC 5/25 remains off of ventilator support since May 21, remains anuric, 5/26  5/26 patient will be transferred to [facility] as he will need intermittent hemodialysis which is not available at [facility].  Lines and tubes: 4/21 - ETT >>5.11.2020 4/21 - RIJ CVC 4/22 L IJ HD Cath> 5/13 Permacath right  Antibiotics: 02/23/2019-425 2020 azithromycin and Rocephin 02/23/2019-02/24/2019 03/06/2019 Rocephin 5/11-5/17 Vancomycin 5/12-5/18 Zosyn 5/12-5/17 Fluconazole for candida in tracheal aspirate and recent Actemra  Significant diagnostic Tests; POC U/S 4/21: Bilateral interstitial pattern with bilateral dense consolidation at bases Echo shows low-normal LVSF. RV moderately dilated with septal shift.  5/14 Echo> LVEF > 65%, RV dilated, decreased RV function (moderate) with RVSP 60  Subjective/Interval  History:  He is started on a diet after MBS yesterday, feeding tube is out He continue to have significant airway secretion requiring frequent suctioning  Overall He is getting stronger, getting up to chair,   Repeat covid test negativex2    Assessment/Plan:  Acute Hypoxic Resp. Failure due to Acute Covid 19 Viral Illness/ARDS  -  Weaned off the vent 5/21, no vent requirement since,  currently on trach T-piece. -Received steroids initially. -Received Actemra initially 4/22 -received convulsant plasma 5/3 -- initially on  CRRT, now in IHD, hoping for renal recovery, nephrology following. -tube feeds stopped, start on diet on 5/30, SLP following for diet advancement and  possible  PMV - pulmonology following intermittently  for trach care and downsizing -not a  Candidate for CIR, SNF placement (covid test negative x2)    Presumed acute PE - Patient had an acute episode of hypoxia with respiratory decompensation.  Echocardiogram showed right-sided strain.  Patient was empiricaly placed on IV heparin due to concern for underlying pulmonary embolism.  Lower extremity Doppler studies negative.  3 months of anticoagulation recommended.  -transition to lovenox therapeutic dose on 5/30, plan to transition to eliquis renal dosing once able to take oral meds reliably    Septic shock - Patient has been weaned off of pressors. -resolved  Acute kidney injury due to ATN - was on CRRT, now on intermittent hemodialysis. -plan per nephrology  Anemia due to chronic illness and possible acute GI blood loss -No evidance  of significant GI bleed, likely in the setting of suctioning, this has resolved . -Received 2 units PRBC 03/28/2019, hemoglobin stable around 9 since 5/26.  Leukocytosis Patient remains afebrile.  WBC normalized.  Stool studies were negative for C. difficile a few days ago.  Diabetes mellitus type 2 Patient on basal insulin as well as sliding scale coverage.  HbA1c 5.5.   Am blood glucose 113   Pressure injury on the skin of nose Local wound care. May need plastic surgery consult if patient agrees to    DVT Prophylaxis: On heparin infusion , now on therapeutic lovenox since 5/30, expect transition to eliquis once able to take oral meds reliably    PUD Prophylaxis: On famotidine Code Status: Partial code Family Communication:  Wife Energy manager number [phone].  Discussing with family on daily basis via interpreter (Interpreter number is [phone]. If you call her she will connect you to family and interpret for you) disposition Plan:  needs trach downsizing, speech eval,  Now a candidate for CIR LTAC vs snf placement , repeat COVID 19 test negative x2 on 5/28 and 5/30   Consultants: Pulmonology.  Nephrology.     Medications:  Scheduled: . calcitonin  4 Units/kg Subcutaneous BID  . chlorhexidine  15 mL Mouth Rinse BID  . Chlorhexidine Gluconate Cloth  6 each Topical Daily  . Chlorhexidine Gluconate Cloth  6 each Topical Q0600  . clonazepam  0.25 mg Oral Daily  . darbepoetin (ARANESP) injection - NON-DIALYSIS  100 mcg Subcutaneous Q Sat-1800  . enoxaparin (LOVENOX) injection  60 mg Subcutaneous Q24H  . famotidine  20 mg Oral Daily  . feeding supplement (NEPRO CARB STEADY)  1,000 mL Oral Q24H  . feeding supplement (PRO-STAT SUGAR FREE 64)  30 mL Oral TID  . free water  100 mL Oral Q8H  . guaiFENesin  5 mL Oral BID  . heparin  3,400 Units Intravenous Once  . insulin aspart  0-15 Units Subcutaneous Q4H  . insulin aspart  6 Units Subcutaneous Q4H  . insulin glargine  10 Units Subcutaneous BID  . mouth rinse  15 mL Mouth Rinse q12n4p  . mupirocin cream   Topical BID  . oxyCODONE  5 mg Oral BID  . [START ON 04/04/2019] pneumococcal 23 valent vaccine  0.5 mL Intramuscular Tomorrow-1000  . sodium chloride flush  10-40 mL Intracatheter Q12H  . sodium zirconium cyclosilicate  10 g Oral Daily   Continuous: .  sodium chloride    . sodium chloride Stopped (03/21/19 2128)   YPP:JKDTO/IZTIWPYK arterial line **AND** sodium chloride, sodium chloride, acetaminophen, glycopyrrolate, heparin, lip balm, metoprolol tartrate, morphine injection, ondansetron (ZOFRAN) IV, Resource ThickenUp Clear, sodium chloride flush   Objective:  Vital Signs  Vitals:   04/03/19 1215 04/03/19 1300 04/03/19 1330 04/03/19 1400  BP: 117/79 (!) 126/91 (P) 115/68 (!) (P) 127/97  Pulse: 93 90 (P) 94 (P) 97  Resp: (!) 21 (!) 26 (!) (P) 25 (!) (P) 28  Temp:      TempSrc:      SpO2: 94% 97% (P) 96% (P) 95%  Weight:      Height:        Intake/Output Summary (Last 24 hours) at 04/03/2019 1433 Last data filed at 04/03/2019 0959 Gross per 24 hour  Intake -  Output 850 ml  Net -850 ml   Filed Weights   04/02/19 0342 04/03/19 0400 04/03/19 1200  Weight: 63.1 kg 63.2 kg 63.2 kg   Awake, alert, in bed, in no apparent distress  trach in place,   Good air movement bilaterally, CTAB RRR,No Gallops,Rubs or new Murmurs, +ve B.Sounds, Abd Soft, No tenderness, No rebound - guarding or rigidity. No Cyanosis, Clubbing or edema     Lab Results:  Data Reviewed: I have  personally reviewed following labs and imaging studies  CBC: Recent Labs  Lab 03/28/19 0514 03/28/19 2000 03/29/19 0500 03/30/19 0220 04/02/19 0707 04/03/19 0531  WBC 12.8*  --  11.3* 8.4 8.5 8.0  HGB 6.1* 7.4* 8.5* 9.2* 8.9* 8.9*  HCT 21.0* 24.0* 26.9* 29.6* 28.0* 28.1*  MCV 99.5  --  94.1 96.1 96.9 94.9  PLT 335  --  309 299 295 220    Basic Metabolic Panel: Recent Labs  Lab 03/28/19 0514 03/28/19 2000 03/29/19 0500 03/29/19 1643 03/30/19 0220 04/01/19 0917 04/02/19 0707 04/03/19 0531  NA 134* 133* 135 135 136 135 134* 136  K 5.4* 5.6* 5.6* 4.7 5.0 4.3 4.0 4.2  CL 98 97* 97* 98 99 95* 95* 95*  CO2 25 24 24 27 29 29 26 24   GLUCOSE 135* 151* 123* 222* 108* 136* 133* 113*  BUN 87* 114* 124* 98* 69* 105* 82* 106*  CREATININE 4.37* 5.85*  6.81* 5.43* 3.61* 6.51* 5.73* 6.53*  CALCIUM 11.7* 11.8* 12.1* 10.6* 10.2 12.5* 12.2* 13.1*  MG  --   --   --   --  3.1*  --   --   --   PHOS 7.3* 9.0* 10.0* 6.4* 4.8*  --   --   --     GFR: Estimated Creatinine Clearance: 12 mL/min (A) (by C-G formula based on SCr of 6.53 mg/dL (H)).  Liver Function Tests: Recent Labs  Lab 03/28/19 0514 03/28/19 2000 03/29/19 0500 03/29/19 1643 03/30/19 0220  ALBUMIN 2.8* 2.8* 2.7* 2.8* 2.8*    CBG: Recent Labs  Lab 04/02/19 2354 04/03/19 0158 04/03/19 0404 04/03/19 0742 04/03/19 1215  GLUCAP 110* 109* 111* 118* 145*     Recent Results (from the past 240 hour(s))  SARS Coronavirus 2 (CEPHEID- Performed in [place] hospital lab), Hosp Order     Status: None   Collection Time: 04/02/19 12:40 PM  Result Value Ref Range Status   SARS Coronavirus 2 NEGATIVE NEGATIVE Final    Comment: (NOTE) If result is NEGATIVE SARS-CoV-2 target nucleic acids are NOT DETECTED. The SARS-CoV-2 RNA is generally detectable in upper and lower  respiratory specimens during the acute phase of infection. The lowest  concentration of SARS-CoV-2 viral copies this assay can detect is 250  copies / mL. A negative result does not preclude SARS-CoV-2 infection  and should not be used as the sole basis for treatment or other  patient management decisions.  A negative result may occur with  improper specimen collection / handling, submission of specimen other  than nasopharyngeal swab, presence of viral mutation(s) within the  areas targeted by this assay, and inadequate number of viral copies  (<250 copies / mL). A negative result must be combined with clinical  observations, patient history, and epidemiological information. If result is POSITIVE SARS-CoV-2 target nucleic acids are DETECTED. The SARS-CoV-2 RNA is generally detectable in upper and lower  respiratory specimens dur ing the acute phase of infection.  Positive  results are indicative of active  infection with SARS-CoV-2.  Clinical  correlation with patient history and other diagnostic information is  necessary to determine patient infection status.  Positive results do  not rule out bacterial infection or co-infection with other viruses. If result is PRESUMPTIVE POSTIVE SARS-CoV-2 nucleic acids MAY BE PRESENT.   A presumptive positive result was obtained on the submitted specimen  and confirmed on repeat testing.  While 2019 novel coronavirus  (SARS-CoV-2) nucleic acids may be present in the submitted sample  additional  confirmatory testing may be necessary for epidemiological  and / or clinical management purposes  to differentiate between  SARS-CoV-2 and other Sarbecovirus currently known to infect humans.  If clinically indicated additional testing with an alternate test  methodology [phone]) is advised. The SARS-CoV-2 RNA is generally  detectable in upper and lower respiratory sp ecimens during the acute  phase of infection. The expected result is Negative. Fact Sheet for Patients:  StrictlyIdeas.no Fact Sheet for Healthcare Providers: BankingDealers.co.za This test is not yet approved or cleared by the [place] FDA and has been authorized for detection and/or diagnosis of SARS-CoV-2 by FDA under an Emergency Use Authorization (EUA).  This EUA will remain in effect (meaning this test can be used) for the duration of the COVID-19 declaration under Section 564(b)(1) of the Act, 21 U.S.C. section 360bbb-3(b)(1), unless the authorization is terminated or revoked sooner. Performed at [facility], [place] [address] [place], [place] [address]   SARS Coronavirus 2 (CEPHEID- Performed in [place] hospital lab), Hosp Order     Status: None   Collection Time: 04/03/19 11:28 AM  Result Value Ref Range Status   SARS Coronavirus 2 NEGATIVE NEGATIVE Final    Comment: (NOTE) If result is NEGATIVE SARS-CoV-2 target nucleic  acids are NOT DETECTED. The SARS-CoV-2 RNA is generally detectable in upper and lower  respiratory specimens during the acute phase of infection. The lowest  concentration of SARS-CoV-2 viral copies this assay can detect is 250  copies / mL. A negative result does not preclude SARS-CoV-2 infection  and should not be used as the sole basis for treatment or other  patient management decisions.  A negative result may occur with  improper specimen collection / handling, submission of specimen other  than nasopharyngeal swab, presence of viral mutation(s) within the  areas targeted by this assay, and inadequate number of viral copies  (<250 copies / mL). A negative result must be combined with clinical  observations, patient history, and epidemiological information. If result is POSITIVE SARS-CoV-2 target nucleic acids are DETECTED. The SARS-CoV-2 RNA is generally detectable in upper and lower  respiratory specimens dur ing the acute phase of infection.  Positive  results are indicative of active infection with SARS-CoV-2.  Clinical  correlation with patient history and other diagnostic information is  necessary to determine patient infection status.  Positive results do  not rule out bacterial infection or co-infection with other viruses. If result is PRESUMPTIVE POSTIVE SARS-CoV-2 nucleic acids MAY BE PRESENT.   A presumptive positive result was obtained on the submitted specimen  and confirmed on repeat testing.  While 2019 novel coronavirus  (SARS-CoV-2) nucleic acids may be present in the submitted sample  additional confirmatory testing may be necessary for epidemiological  and / or clinical management purposes  to differentiate between  SARS-CoV-2 and other Sarbecovirus currently known to infect humans.  If clinically indicated additional testing with an alternate test  methodology [phone]) is advised. The SARS-CoV-2 RNA is generally  detectable in upper and lower respiratory  sp ecimens during the acute  phase of infection. The expected result is Negative. Fact Sheet for Patients:  StrictlyIdeas.no Fact Sheet for Healthcare Providers: BankingDealers.co.za This test is not yet approved or cleared by the [place] FDA and has been authorized for detection and/or diagnosis of SARS-CoV-2 by FDA under an Emergency Use Authorization (EUA).  This EUA will remain in effect (meaning this test can be used) for the duration of the COVID-19 declaration under Section  564(b)(1) of the Act, 21 U.S.C. section 360bbb-3(b)(1), unless the authorization is terminated or revoked sooner. Performed at [facility], [place] [address] [place], [place] [address]       Radiology Studies: Dg Abd Portable 1v  Result Date: 04/02/2019 CLINICAL DATA:  Abdominal pain EXAM: PORTABLE ABDOMEN - 1 VIEW COMPARISON:  03/31/2019 FINDINGS: Nonobstructed bowel gas pattern with residual contrast material in nondilated small bowel and colon. This limits evaluation for radiopaque calculi. IMPRESSION: Nonobstructed gas pattern with residual contrast material in the large and small bowel Electronically Signed   By: Donavan Foil M.D.   On: 04/02/2019 20:41   Dg Swallowing Func-speech Pathology  Result Date: 04/02/2019 Objective Swallowing Evaluation: Type of Study: MBS-Modified Barium Swallow Study  Patient Details Name: Macsen Nuttall MRN: [id number] Date of Birth: January 10, 1971 Today's Date: 04/02/2019 Time: SLP Start Time (ACUTE ONLY): 1600 -SLP Stop Time (ACUTE ONLY): 1630 SLP Time Calculation (min) (ACUTE ONLY): 30 min Past Medical History: No past medical history on file. Past Surgical History:  The histories are not reviewed yet. Please review them in the "History" navigator section and refresh this [place]. HPI: Pt is a 48 y.o. male admitted on 02/23/19 with AMS, O2 sats in the ED were 50%.  Pt dx with COVID 19 and resultant ARDS.  He was  intubated 4/21-5/11; reintubated and then trached on 5/12. His course is complicated by acute kidney injury requiring CVVHD.  PMH includes DM2, HTN.  Subjective: pt was upright in his chair, cooperative Assessment / Plan / Recommendation CHL IP CLINICAL IMPRESSIONS 04/02/2019 Clinical Impression Pt has a mild-moderate oropharyngeal dysphagia, with testing done without PMV in place as it has not been relocated s/p transfer to [facility]. Despite this, he demonstrates good airway protection with solids and nectar thick liquids. Thin liquids intermittently entered the airway in trace-mild amounts, but aspiration was silent. His swallow overall appeared mildly slowed and with reduced strenght. Lingual and vallecular residue was present, with pt performing a second swallow spontaneously to reduce residue in both locations. UES is tight with mild retrograde flow within the cervical esophagus that does not re-enter the pharynx during this study. Recommend starting with Dys 1 diet and nectar thick liquids. PMV not needed for intake, but SLP will f/u with pt to replace and continue to facilitate communication.  SLP Visit Diagnosis Dysphagia, oropharyngeal phase (R13.12) Attention and concentration deficit following -- Frontal lobe and executive function deficit following -- Impact on safety and function Mild aspiration risk   CHL IP TREATMENT RECOMMENDATION 04/02/2019 Treatment Recommendations Therapy as outlined in treatment plan below   Prognosis 04/02/2019 Prognosis for Safe Diet Advancement Good Barriers to Reach Goals -- Barriers/Prognosis Comment -- CHL IP DIET RECOMMENDATION 04/02/2019 SLP Diet Recommendations Dysphagia 1 (Puree) solids;Nectar thick liquid Liquid Administration via Cup;Straw Medication Administration Crushed with puree Compensations Slow rate;Small sips/bites;Multiple dry swallows after each bite/sip Postural Changes Seated upright at 90 degrees;Remain semi-upright after after feeds/meals (Comment)   CHL IP  OTHER RECOMMENDATIONS 04/02/2019 Recommended Consults -- Oral Care Recommendations Oral care BID Other Recommendations Order thickener from pharmacy;Prohibited food (jello, ice cream, thin soups);Remove water pitcher   CHL IP FOLLOW UP RECOMMENDATIONS 04/02/2019 Follow up Recommendations Inpatient Rehab   CHL IP FREQUENCY AND DURATION 04/02/2019 Speech Therapy Frequency (ACUTE ONLY) min 2x/week Treatment Duration 2 weeks      CHL IP ORAL PHASE 04/02/2019 Oral Phase Impaired Oral - Pudding Teaspoon -- Oral - Pudding Cup -- Oral - Honey Teaspoon -- Oral - Honey  Cup -- Oral - Nectar Teaspoon -- Oral - Nectar Cup -- Oral - Nectar Straw Weak lingual manipulation;Lingual/palatal residue Oral - Thin Teaspoon Weak lingual manipulation;Lingual/palatal residue Oral - Thin Cup -- Oral - Thin Straw Weak lingual manipulation;Lingual/palatal residue Oral - Puree Weak lingual manipulation;Lingual/palatal residue Oral - Mech Soft Weak lingual manipulation;Lingual/palatal residue Oral - Regular -- Oral - Multi-Consistency -- Oral - Pill -- Oral Phase - Comment --  CHL IP PHARYNGEAL PHASE 04/02/2019 Pharyngeal Phase Impaired Pharyngeal- Pudding Teaspoon -- Pharyngeal -- Pharyngeal- Pudding Cup -- Pharyngeal -- Pharyngeal- Honey Teaspoon -- Pharyngeal -- Pharyngeal- Honey Cup -- Pharyngeal -- Pharyngeal- Nectar Teaspoon -- Pharyngeal -- Pharyngeal- Nectar Cup -- Pharyngeal -- Pharyngeal- Nectar Straw Reduced anterior laryngeal mobility;Reduced laryngeal elevation;Reduced tongue base retraction;Reduced airway/laryngeal closure;Pharyngeal residue - valleculae Pharyngeal -- Pharyngeal- Thin Teaspoon Reduced anterior laryngeal mobility;Reduced laryngeal elevation;Reduced tongue base retraction;Reduced airway/laryngeal closure;Pharyngeal residue - valleculae Pharyngeal -- Pharyngeal- Thin Cup -- Pharyngeal -- Pharyngeal- Thin Straw Reduced anterior laryngeal mobility;Reduced laryngeal elevation;Reduced tongue base retraction;Reduced  airway/laryngeal closure;Pharyngeal residue - valleculae;Penetration/Aspiration during swallow Pharyngeal Material enters airway, passes BELOW cords without attempt by patient to eject out (silent aspiration) Pharyngeal- Puree Reduced anterior laryngeal mobility;Reduced laryngeal elevation;Reduced tongue base retraction;Reduced airway/laryngeal closure;Pharyngeal residue - valleculae Pharyngeal -- Pharyngeal- Mechanical Soft Reduced anterior laryngeal mobility;Reduced laryngeal elevation;Reduced tongue base retraction;Reduced airway/laryngeal closure;Pharyngeal residue - valleculae Pharyngeal -- Pharyngeal- Regular -- Pharyngeal -- Pharyngeal- Multi-consistency -- Pharyngeal -- Pharyngeal- Pill -- Pharyngeal -- Pharyngeal Comment --  CHL IP CERVICAL ESOPHAGEAL PHASE 04/02/2019 Cervical Esophageal Phase Impaired Pudding Teaspoon -- Pudding Cup -- Honey Teaspoon -- Honey Cup -- Nectar Teaspoon -- Nectar Cup -- Nectar Straw Reduced cricopharyngeal relaxation;Esophageal backflow into cervical esophagus Thin Teaspoon Reduced cricopharyngeal relaxation;Esophageal backflow into cervical esophagus Thin Cup -- Thin Straw Reduced cricopharyngeal relaxation;Esophageal backflow into cervical esophagus Puree Reduced cricopharyngeal relaxation Mechanical Soft Reduced cricopharyngeal relaxation Regular -- Multi-consistency -- Pill -- Cervical Esophageal Comment -- Venita Sheffield Nix 04/02/2019, 4:43 PM  Pollyann Glen, M.A. [facility] Pager [phone] Office [phone]                 LOS: 50 days   Florencia Reasons MD PhD  [facility] Pager on [email or web address]  04/03/2019, 2:33 PM

## 2019-04-03 NOTE — Progress Notes (Signed)
The chaplain continues to communicate with Pt. medical care team about spiritual care visit.

## 2019-04-03 NOTE — Progress Notes (Signed)
MD paged for request for critical consult d/t pt desat, inability to call for help and language barriers.

## 2019-04-03 NOTE — Progress Notes (Signed)
Night shift nurse reported the pm hospitalist discussed that you can put an order in for cortrack if decide to replace ng tube.

## 2019-04-03 NOTE — Progress Notes (Signed)
Pt desat-74-76%. RT called to assit. Pt had to be saline and ambu bag cleared by RT. Pt returned to above 92%. MD paged to make aware.

## 2019-04-03 NOTE — Progress Notes (Signed)
Subjective:  Had 700 cc UOP yesterday and 150 cc this am. Discussed w/ Triad MD.   Objective Vital signs in last 24 hours: Vitals:   04/03/19 0400 04/03/19 0804 04/03/19 0935 04/03/19 1107  BP:      Pulse: 81 84 88 85  Resp: 18 19 (!) 23 14  Temp:      TempSrc:      SpO2: 100% 95% 98% 100%  Weight: 63.2 kg     Height:       Weight change: -0.6 kg  Intake/Output Summary (Last 24 hours) at 04/03/2019 1147 Last data filed at 04/03/2019 0959 Gross per 24 hour  Intake -  Output 850 ml  Net -850 ml   Physical Exam: Trach, NG tube , R chest HD cath, seen in room, no distress Chest cta bilat Cor reg Abd soft ntnd no ascites  Ext no edema LE's or UE's RIJ TDC   CXR 5/17 diffuse stable infiltrates  CXR 5/21 no chg stable infiltrates  Assessment/ Plan: Pt is a 48 y.o. yo male who was admitted on 02/23/2019 with anuric AKI due to ATN in the setting of COVID   Assessment/Plan: 1. AKI-  On CRRT since 4/21. Made respiratory recovery w/ trach and taken off vent on 5/21. Now is stable on trach collar.  Made urine yest 5/27 250 cc 1st time in weeks. Has [facility] for dialysis.  - wt's stable - HD today (Sat) then will watch B/Cr for signs of recovery as pt is making urine now which is new x 3-4 days, no fluid off w HD today - "renal" diet when able to eat - bladder scan daily - hypercalcemic, not getting any vit D /Ca products, will use low Ca bath 2.0 today and start calcitonin, check pth level, poss d/t immobilization  2. HTN/vol- normotensive  3. Anemia- hgb 6- 9 range. SP 2u prbc's on 5/24. Started darbe 100 ug /wk SQ 1st dose 5/23, second dose today.  Cont to transfuse as needed. Tsat 9%. Hb up 9.2 today  4. ID-  covid positive. SP IV course of IV abx   5. VDRF- sp trach, off vent 5/21   [facility] 04/03/2019, 11:47 AM    Labs: Basic Metabolic Panel: Recent Labs  Lab 03/29/19 0500 03/29/19 1643 03/30/19 0220 04/01/19 0917 04/02/19 0707  04/03/19 0531  NA 135 135 136 135 134* 136  K 5.6* 4.7 5.0 4.3 4.0 4.2  CL 97* 98 99 95* 95* 95*  CO2 24 27 29 29 26 24   GLUCOSE 123* 222* 108* 136* 133* 113*  BUN 124* 98* 69* 105* 82* 106*  CREATININE 6.81* 5.43* 3.61* 6.51* 5.73* 6.53*  CALCIUM 12.1* 10.6* 10.2 12.5* 12.2* 13.1*  PHOS 10.0* 6.4* 4.8*  --   --   --    Liver Function Tests: Recent Labs  Lab 03/29/19 0500 03/29/19 1643 03/30/19 0220  ALBUMIN 2.7* 2.8* 2.8*   No results for input(s): LIPASE, AMYLASE in the last 168 hours. No results for input(s): AMMONIA in the last 168 hours. CBC: Recent Labs  Lab 03/28/19 0514  03/29/19 0500 03/30/19 0220 04/02/19 0707 04/03/19 0531  WBC 12.8*  --  11.3* 8.4 8.5 8.0  HGB 6.1*   < > 8.5* 9.2* 8.9* 8.9*  HCT 21.0*   < > 26.9* 29.6* 28.0* 28.1*  MCV 99.5  --  94.1 96.1 96.9 94.9  PLT 335  --  309 299 295 323   < > = values  in this interval not displayed.   Cardiac Enzymes: No results for input(s): CKTOTAL, CKMB, CKMBINDEX, TROPONINI in the last 168 hours. CBG: Recent Labs  Lab 04/02/19 1957 04/02/19 2354 04/03/19 0158 04/03/19 0404 04/03/19 0742  GLUCAP 107* 110* 109* 111* 118*    Iron Studies:  Recent Labs    04/01/19 0917  IRON 31*  TIBC 333   Studies/Results: Dg Abd Portable 1v  Result Date: 04/02/2019 CLINICAL DATA:  Abdominal pain EXAM: PORTABLE ABDOMEN - 1 VIEW COMPARISON:  03/31/2019 FINDINGS: Nonobstructed bowel gas pattern with residual contrast material in nondilated small bowel and colon. This limits evaluation for radiopaque calculi. IMPRESSION: Nonobstructed gas pattern with residual contrast material in the large and small bowel Electronically Signed   By: Donavan Foil M.D.   On: 04/02/2019 20:41   Dg Swallowing Func-speech Pathology  Result Date: 04/02/2019 Objective Swallowing Evaluation: Type of Study: MBS-Modified Barium Swallow Study  Patient Details Name: Alphonza Tramell MRN: [id number] Date of Birth: 01/12/1971 Today's Date:  04/02/2019 Time: SLP Start Time (ACUTE ONLY): 1600 -SLP Stop Time (ACUTE ONLY): 1630 SLP Time Calculation (min) (ACUTE ONLY): 30 min Past Medical History: No past medical history on file. Past Surgical History:  The histories are not reviewed yet. Please review them in the "History" navigator section and refresh this Corazon. HPI: Pt is a 48 y.o. male admitted on 02/23/19 with AMS, O2 sats in the ED were 50%.  Pt dx with COVID 19 and resultant ARDS.  He was intubated 4/21-5/11; reintubated and then trached on 5/12. His course is complicated by acute kidney injury requiring CVVHD.  PMH includes DM2, HTN.  Subjective: pt was upright in his chair, cooperative Assessment / Plan / Recommendation CHL IP CLINICAL IMPRESSIONS 04/02/2019 Clinical Impression Pt has a mild-moderate oropharyngeal dysphagia, with testing done without PMV in place as it has not been relocated s/p transfer to [facility]. Despite this, he demonstrates good airway protection with solids and nectar thick liquids. Thin liquids intermittently entered the airway in trace-mild amounts, but aspiration was silent. His swallow overall appeared mildly slowed and with reduced strenght. Lingual and vallecular residue was present, with pt performing a second swallow spontaneously to reduce residue in both locations. UES is tight with mild retrograde flow within the cervical esophagus that does not re-enter the pharynx during this study. Recommend starting with Dys 1 diet and nectar thick liquids. PMV not needed for intake, but SLP will f/u with pt to replace and continue to facilitate communication.  SLP Visit Diagnosis Dysphagia, oropharyngeal phase (R13.12) Attention and concentration deficit following -- Frontal lobe and executive function deficit following -- Impact on safety and function Mild aspiration risk   CHL IP TREATMENT RECOMMENDATION 04/02/2019 Treatment Recommendations Therapy as outlined in treatment plan below   Prognosis 04/02/2019 Prognosis for Safe  Diet Advancement Good Barriers to Reach Goals -- Barriers/Prognosis Comment -- CHL IP DIET RECOMMENDATION 04/02/2019 SLP Diet Recommendations Dysphagia 1 (Puree) solids;Nectar thick liquid Liquid Administration via Cup;Straw Medication Administration Crushed with puree Compensations Slow rate;Small sips/bites;Multiple dry swallows after each bite/sip Postural Changes Seated upright at 90 degrees;Remain semi-upright after after feeds/meals (Comment)   CHL IP OTHER RECOMMENDATIONS 04/02/2019 Recommended Consults -- Oral Care Recommendations Oral care BID Other Recommendations Order thickener from pharmacy;Prohibited food (jello, ice cream, thin soups);Remove water pitcher   CHL IP FOLLOW UP RECOMMENDATIONS 04/02/2019 Follow up Recommendations Inpatient Rehab   CHL IP FREQUENCY AND DURATION 04/02/2019 Speech Therapy Frequency (ACUTE ONLY) min 2x/week Treatment Duration 2 weeks  CHL IP ORAL PHASE 04/02/2019 Oral Phase Impaired Oral - Pudding Teaspoon -- Oral - Pudding Cup -- Oral - Honey Teaspoon -- Oral - Honey Cup -- Oral - Nectar Teaspoon -- Oral - Nectar Cup -- Oral - Nectar Straw Weak lingual manipulation;Lingual/palatal residue Oral - Thin Teaspoon Weak lingual manipulation;Lingual/palatal residue Oral - Thin Cup -- Oral - Thin Straw Weak lingual manipulation;Lingual/palatal residue Oral - Puree Weak lingual manipulation;Lingual/palatal residue Oral - Mech Soft Weak lingual manipulation;Lingual/palatal residue Oral - Regular -- Oral - Multi-Consistency -- Oral - Pill -- Oral Phase - Comment --  CHL IP PHARYNGEAL PHASE 04/02/2019 Pharyngeal Phase Impaired Pharyngeal- Pudding Teaspoon -- Pharyngeal -- Pharyngeal- Pudding Cup -- Pharyngeal -- Pharyngeal- Honey Teaspoon -- Pharyngeal -- Pharyngeal- Honey Cup -- Pharyngeal -- Pharyngeal- Nectar Teaspoon -- Pharyngeal -- Pharyngeal- Nectar Cup -- Pharyngeal -- Pharyngeal- Nectar Straw Reduced anterior laryngeal mobility;Reduced laryngeal elevation;Reduced tongue base  retraction;Reduced airway/laryngeal closure;Pharyngeal residue - valleculae Pharyngeal -- Pharyngeal- Thin Teaspoon Reduced anterior laryngeal mobility;Reduced laryngeal elevation;Reduced tongue base retraction;Reduced airway/laryngeal closure;Pharyngeal residue - valleculae Pharyngeal -- Pharyngeal- Thin Cup -- Pharyngeal -- Pharyngeal- Thin Straw Reduced anterior laryngeal mobility;Reduced laryngeal elevation;Reduced tongue base retraction;Reduced airway/laryngeal closure;Pharyngeal residue - valleculae;Penetration/Aspiration during swallow Pharyngeal Material enters airway, passes BELOW cords without attempt by patient to eject out (silent aspiration) Pharyngeal- Puree Reduced anterior laryngeal mobility;Reduced laryngeal elevation;Reduced tongue base retraction;Reduced airway/laryngeal closure;Pharyngeal residue - valleculae Pharyngeal -- Pharyngeal- Mechanical Soft Reduced anterior laryngeal mobility;Reduced laryngeal elevation;Reduced tongue base retraction;Reduced airway/laryngeal closure;Pharyngeal residue - valleculae Pharyngeal -- Pharyngeal- Regular -- Pharyngeal -- Pharyngeal- Multi-consistency -- Pharyngeal -- Pharyngeal- Pill -- Pharyngeal -- Pharyngeal Comment --  CHL IP CERVICAL ESOPHAGEAL PHASE 04/02/2019 Cervical Esophageal Phase Impaired Pudding Teaspoon -- Pudding Cup -- Honey Teaspoon -- Honey Cup -- Nectar Teaspoon -- Nectar Cup -- Nectar Straw Reduced cricopharyngeal relaxation;Esophageal backflow into cervical esophagus Thin Teaspoon Reduced cricopharyngeal relaxation;Esophageal backflow into cervical esophagus Thin Cup -- Thin Straw Reduced cricopharyngeal relaxation;Esophageal backflow into cervical esophagus Puree Reduced cricopharyngeal relaxation Mechanical Soft Reduced cricopharyngeal relaxation Regular -- Multi-consistency -- Pill -- Cervical Esophageal Comment -- Venita Sheffield Nix 04/02/2019, 4:43 PM  Pollyann Glen, M.A. CCC-SLP Acute Rehabilitation Services Pager [phone] Office  [phone]             Medications: Infusions: . sodium chloride    . sodium chloride Stopped (03/21/19 2128)    Scheduled Medications: . chlorhexidine  15 mL Mouth Rinse BID  . Chlorhexidine Gluconate Cloth  6 each Topical Daily  . Chlorhexidine Gluconate Cloth  6 each Topical Q0600  . clonazepam  0.25 mg Oral Daily  . darbepoetin (ARANESP) injection - NON-DIALYSIS  100 mcg Subcutaneous Q Sat-1800  . dextrose      . enoxaparin (LOVENOX) injection  60 mg Subcutaneous Q24H  . famotidine  20 mg Oral Daily  . feeding supplement (NEPRO CARB STEADY)  1,000 mL Oral Q24H  . feeding supplement (PRO-STAT SUGAR FREE 64)  30 mL Oral TID  . free water  100 mL Oral Q8H  . guaiFENesin  5 mL Oral BID  . heparin  3,400 Units Intravenous Once  . insulin aspart  0-15 Units Subcutaneous Q4H  . insulin aspart  6 Units Subcutaneous Q4H  . insulin glargine  10 Units Subcutaneous BID  . mouth rinse  15 mL Mouth Rinse q12n4p  . mupirocin cream   Topical BID  . oxyCODONE  5 mg Oral BID  . [START ON 04/04/2019] pneumococcal 23 valent vaccine  0.5 mL Intramuscular Tomorrow-1000  .  sodium chloride flush  10-40 mL Intracatheter Q12H  . sodium zirconium cyclosilicate  10 g Oral Daily    have reviewed scheduled and prn medications.

## 2019-04-03 NOTE — Progress Notes (Signed)
On call hosp paged re: 1:1 sitter order

## 2019-04-03 NOTE — Progress Notes (Signed)
Nephrology paged per MD request to confirm if pt was going to have dialysis today.

## 2019-04-03 NOTE — Progress Notes (Signed)
Pt desat while on dialysis to 74%. Pt encouraged to clear airway, suctioned, bag masked return O2 sats between 92-96%.

## 2019-04-03 NOTE — Progress Notes (Signed)
[place] for Lovenox Indication: R/o PE   Not on File  Patient Measurements: Height: 5\' 5"  (165.1 cm) Weight: 139 lb 5.3 oz (63.2 kg) IBW/kg (Calculated) : 61.5  Vital Signs: Temp: 97.9 F (36.6 C) (05/30 0347) Temp Source: Oral (05/30 0347) BP: 121/91 (05/30 0347) Pulse Rate: 84 (05/30 0804)  Labs: Recent Labs    04/01/19 0917 04/01/19 1835 04/02/19 0707 04/03/19 0531  HGB  --   --  8.9* 8.9*  HCT  --   --  28.0* 28.1*  PLT  --   --  295 323  HEPARINUNFRC 0.35 0.35 0.32 0.40  CREATININE 6.51*  --  5.73* 6.53*   Estimated Creatinine Clearance: 12 mL/min (A) (by C-G formula based on SCr of 6.53 mg/dL (H)).  Medical History: No past medical history on file.  Medications:  No medications prior to admission.   Assessment: 67 YOM who was on IV heparin for an extended period of time and transitioned to apixaban on 5/23 for r/o PE. Pt then experienced a drop in Hgb on 5/24 requiring 2u PRBCs. Apixaban was stopped but now hgb remains stable with no s/s of overt bleeding. Pharmacy consulted to restart IV heparin.   Pt fell in room the evening of 5/26, heparin stopped at 2300. CT head negative and heparin resumed 5/27 ~ 0200.  5/27 am HL < 0.10, rate increased to 950 units/hr Rpt HL 5/27 pm was low at 0.16, mild oozing from trach site per RN, rate increased to 1150 units/hr  5/30: IV Heparin to Lovenox daily  Goal of Therapy:  Heparin level 0.3-0.7 units/ml Monitor platelets by anticoagulation protocol: Yes   Today, 04/03/2019 0531 HL = 0.4 units/ml on 1150 units/hr, in desired range Hgb continues 8.9 gm/dl, Plt wnl No bleed from head laceration noted  Plan:  Discontinue Heparin infusion  Lovenox 60mg  SQ daily, begin one hour after Heparin stopped  Monitor CBC, s/s bleed  Consider Anti-Xa level at steady state   Minda Ditto PharmD [phone] Clinical Pharmacist Please check AMION for all [place]  numbers 04/03/2019

## 2019-04-03 NOTE — Progress Notes (Signed)
   04/03/19 1735  Clinical Encounter Type  Visited With Patient  Visit Type Initial  Referral From Chaplain  Consult/Referral To [place]  This chaplain was able to complete the Pt. request for prayer with the help of the 2W team.  The team communicated the Pt. negative Covid test results and mandatory face shield use with the chaplain. At the time of the visit the Pt. had finished eating his evening meal and was sitting up in bed.  The chaplain was welcomed into the room. The IPad was available for Spanish interpretation of the chaplains visit and prayer with the Pt.  The chaplain is available for F/U spiritual care as needed.

## 2019-04-03 NOTE — Progress Notes (Signed)
This chaplain responded to Pt. spiritual care request for prayer.  The chaplain appreciates the Pt. RN-Chailendra input on the appropriate time for a visit.  The chaplain will F/U this afternoon.

## 2019-04-04 LAB — CBC
HCT: 28.4 % — ABNORMAL LOW (ref 39.0–52.0)
Hemoglobin: 9.1 g/dL — ABNORMAL LOW (ref 13.0–17.0)
MCH: 30.8 pg (ref 26.0–34.0)
MCHC: 32 g/dL (ref 30.0–36.0)
MCV: 96.3 fL (ref 80.0–100.0)
Platelets: 282 10*3/uL (ref 150–400)
RBC: 2.95 MIL/uL — ABNORMAL LOW (ref 4.22–5.81)
RDW: 15.2 % (ref 11.5–15.5)
WBC: 8.7 10*3/uL (ref 4.0–10.5)
nRBC: 0 % (ref 0.0–0.2)

## 2019-04-04 LAB — BASIC METABOLIC PANEL
Anion gap: 13 (ref 5–15)
BUN: 66 mg/dL — ABNORMAL HIGH (ref 6–20)
CO2: 26 mmol/L (ref 22–32)
Calcium: 10.2 mg/dL (ref 8.9–10.3)
Chloride: 97 mmol/L — ABNORMAL LOW (ref 98–111)
Creatinine, Ser: 4.9 mg/dL — ABNORMAL HIGH (ref 0.61–1.24)
GFR calc Af Amer: 15 mL/min — ABNORMAL LOW (ref >60)
GFR calc non Af Amer: 13 mL/min — ABNORMAL LOW (ref >60)
Glucose, Bld: 144 mg/dL — ABNORMAL HIGH (ref 70–99)
Potassium: 4 mmol/L (ref 3.5–5.1)
Sodium: 136 mmol/L (ref 135–145)

## 2019-04-04 LAB — GLUCOSE, CAPILLARY
Glucose-Capillary: 126 mg/dL — ABNORMAL HIGH (ref 70–99)
Glucose-Capillary: 128 mg/dL — ABNORMAL HIGH (ref 70–99)
Glucose-Capillary: 119 mg/dL — ABNORMAL HIGH (ref 70–99)
Glucose-Capillary: 157 mg/dL — ABNORMAL HIGH (ref 70–99)
Glucose-Capillary: 167 mg/dL — ABNORMAL HIGH (ref 70–99)
Glucose-Capillary: 129 mg/dL — ABNORMAL HIGH (ref 70–99)

## 2019-04-04 LAB — HEPARIN LEVEL (UNFRACTIONATED): Heparin Unfractionated: 0.27 IU/mL — ABNORMAL LOW (ref 0.30–0.70)

## 2019-04-04 LAB — PARATHYROID HORMONE, INTACT (NO CA): PTH: 5 pg/mL — ABNORMAL LOW (ref 15–65)

## 2019-04-04 LAB — HEPATITIS B SURFACE ANTIGEN: Hepatitis B Surface Ag: NEGATIVE

## 2019-04-04 MED ORDER — PANTOPRAZOLE SODIUM 40 MG PO PACK: 40.0000 mg | PACK | Freq: Two times a day (BID) | ORAL | Status: DC

## 2019-04-04 MED ORDER — SENNOSIDES-DOCUSATE SODIUM 8.6-50 MG PO TABS
1.0000 | ORAL_TABLET | Freq: Two times a day (BID) | ORAL | Status: DC
  Administered 2019-04-04 – 2019-04-19 (×25): 1 via ORAL
  Filled 2019-04-04 (×27): qty 1

## 2019-04-04 MED ORDER — PROMETHAZINE HCL 25 MG/ML IJ SOLN
12.5000 mg | Freq: Once | INTRAMUSCULAR | Status: AC
  Administered 2019-04-04: 12.5 mg via INTRAVENOUS
  Filled 2019-04-04: qty 1

## 2019-04-04 MED ORDER — OXYCODONE HCL 5 MG PO TABS: 5.0000 mg | ORAL_TABLET | Freq: Two times a day (BID) | ORAL | Status: DC | PRN

## 2019-04-04 MED ORDER — PANTOPRAZOLE SODIUM 40 MG PO TBEC
40.0000 mg | DELAYED_RELEASE_TABLET | Freq: Two times a day (BID) | ORAL | Status: DC
  Administered 2019-04-04 – 2019-04-19 (×31): 40 mg via ORAL
  Filled 2019-04-04 (×31): qty 1

## 2019-04-04 MED ORDER — OXYCODONE HCL 5 MG PO TABS
5.0000 mg | ORAL_TABLET | Freq: Every day | ORAL | Status: AC
  Administered 2019-04-04 – 2019-04-06 (×3): 5 mg via ORAL
  Filled 2019-04-04 (×3): qty 1

## 2019-04-04 NOTE — Progress Notes (Signed)
1x Phenergan given. Effective. Pt resting

## 2019-04-04 NOTE — Progress Notes (Signed)
SLP Cancellation Note  Patient Details Name: Andie Mortimer MRN: [id number] DOB: [birth year]   Cancelled treatment:       Reason Eval/Treat Not Completed: Medical issues which prohibited therapy. Pt remains limited by nausea, vomiting. Will f/u as able.   Venita Sheffield Janet Decesare 04/04/2019, 11:11 AM  Pollyann Glen, M.A. [place] Acute Environmental education officer [phone] Office [phone]

## 2019-04-04 NOTE — TOC Progression Note (Signed)
Transition of Care [facility]) - Progression Note    Patient Details  Name: Karandeep Resende MRN: [id number] Date of Birth: 04-13-1971  Transition of Care [facility]) CM/SW [place], [place] Phone Number: 04/04/2019, 1:56 PM  Clinical Narrative:     CSW met with the patient at bedside with the spanish speaking interpretor. Patient currently has a trach and is on 10L of 02. The patient is here on a work visa and does not have insurance. The patient is also requiring hemodialysis. CSW asked along with the translation of the interpretor if the patient would be agreeable to going to either and LTACH or SNF, the patient is agreeable. The patient is currently homeless and his wife lives in [place]. The patient does not have any transportation. The patient also does not have any local friends or family.    CSW called and spoke with assistance director Nathaniel Man about the patient. Zack suggested contacting [facility] to see if the patient would qualify for charity care. Zack also suggested for the hospital staff to continue to work with the patient to see if he could get back to baseline. Patient is qualified for the [employer] program.   CSW will continue to follow and assist with disposition.    Expected Discharge Plan: [place] Barriers to Discharge: Continued Medical Work up(COVID (+); new trach; uninsured)  Expected Discharge Plan and Services Expected Discharge Plan: [place] arrangements for the past 2 months: Single Family Home                                       Social Determinants of Health (SDOH) Interventions    Readmission Risk Interventions Readmission Risk Prevention Plan 03/17/2019  Transportation Screening Not Complete  Medication Review Press photographer) Not Complete  HRI or Home Care Consult Not Complete  Palliative Care Screening Not Complete

## 2019-04-04 NOTE — Progress Notes (Signed)
Assisted tele visit to patient with family member.  Cherre Kothari M, RN  

## 2019-04-04 NOTE — Progress Notes (Addendum)
PROGRESS NOTE  Jonathan [name] ZOX:[id number] DOB: 1971/01/13 DOA: 02/23/2019  PCP: Patient, No Pcp Per  Brief History/Interval Summary:  - 48 y.o. male Jonathan [name] speaking, arrived from [place] on 02/13/2019 and was living in close quarters with 6 other workers. He developed increasing rrespiratory distress and was intubated for ARDS due to COVID-19 infection. He was started on CRRT due to ATN, paralyzed and prone. He was transferred to [place] for further management. He was eventually extubated, but quickly became short of breath again and aspirated when he started vomiting. He was re intubated and ended up with tracheostomy.   Events: 02/23/2019 admitted to the ICU. 02/24/2019 started on CRRT 4/21-4/24 Proned daily 4/28 -4/30 proned daily  03/04/2019 increase in oxygen demand require paralytic for 24 hours. 03/09/2019 hematochezia from gastric aspirat 5/11 extubated 5/12 re-intubated, tracheostomy 5/13 perm cath 5/14 worsening hypoxemia, shock suddenly, RV dilated, no new CXR changes, treated for PE empirically 5/21 doing t-piece trials 5/23: Trial off of CVVHD 5/24: 2 units PRBC 5/25 remains off of ventilator support since May 21, remains anuric, 5/26  patient will be transferred to [facility] as he will need intermittent hemodialysis which is not available at [facility]. 5/29 passed modified swallow eval 5/30 started on nectar thick liquis and taking pills by mouth  Lines and tubes: 4/21 - ETT >>5.11.2020 4/21 - RIJ CVC, removed 4/22 L IJ HD Cath> removed 5/13 Permacath right  Antibiotics: 02/23/2019-425 2020 azithromycin and Rocephin 02/23/2019-02/24/2019 03/06/2019 Rocephin 5/11-5/17 Vancomycin 5/12-5/18 Zosyn 5/12-5/17 Fluconazole for candida in tracheal aspirate and recent Actemra  Significant diagnostic Tests; POC U/S 4/21: Bilateral interstitial pattern with bilateral dense consolidation at bases Echo shows low-normal LVSF. RV moderately dilated with septal shift.   5/14 Echo> LVEF > 65%, RV dilated, decreased RV function (moderate) with RVSP 60  Subjective/Interval History:  He is started on a diet  On 5/30, later developed n/v, kub no acute findings,  Diet back down to nectar thick liquid for now, speech continue to follow  He continue to have significant airway secretion requiring frequent suctioning, secretion is blood tinged   Overall He is getting stronger, getting up to chair,   Repeat covid test negativex2  He is mouthing word, communicate  by writing, RN at bedside helped translate.    Assessment/Plan:  Acute Hypoxic Resp. Failure due to Acute Covid 19 Viral Illness/ARDS/septic shock   -  Weaned off the vent 5/21, no vent requirement since,  currently on trach T-piece. -Received steroids initially. -Received Actemra initially 4/22 -received convulsant plasma 5/3 -- initially on  CRRT, now in IHD, hoping for renal recovery, nephrology following. -tube feeds stopped, start on diet on 5/30, SLP following for diet advancement and  possible  PMV - pulmonology following intermittently  for trach care and downsizing -not a  Candidate for CIR, SNF placement (covid test negative x2)    Presumed acute PE - Patient had an acute episode of hypoxia with respiratory decompensation.  Echocardiogram showed right-sided strain.  Patient was empiricaly placed on IV heparin due to concern for underlying pulmonary embolism.  Lower extremity Doppler studies negative.  3 months of anticoagulation recommended.  -transition to lovenox therapeutic dose on 5/30, plan to transition to eliquis renal dosing once able to take oral meds reliably    Acute kidney injury due to ATN - was on CRRT, now on intermittent hemodialysis. -plan per nephrology  Anemia due to chronic illness and possible acute GI blood loss -No evidance  of significant GI  bleed, likely in the setting of suctioning, this has resolved . -Received 2 units PRBC 03/28/2019, hemoglobin stable  around 9 since 5/26.   Diabetes mellitus type 2 Patient on basal insulin as well as sliding scale coverage.  HbA1c 5.5.  Am blood glucose 144  Pressure injury on the skin of nose.bilateral cheeks Local wound care. Wound care initially though patient May need plastic surgery consult , but wound appear healing  N/V: KUB no acute findings He started to have n/v later on 5/30 , diet changed to liquid Added on ppi, continue pepcid, add on stool regiment, taper off narcotics. Increase activity , mobilize.   DVT Prophylaxis: was On heparin infusion , now on therapeutic lovenox since 5/30, expect transition to eliquis once able to take oral meds reliably    PUD Prophylaxis: On famotidine/ppi Code Status: Partial code Family Communication:  Wife Energy manager number [phone].  Discussing with family on daily basis via interpreter (Interpreter number is [phone]. If you call her she will connect you to family and interpret for you) disposition Plan:  needs trach downsizing, speech eval, PMV and diet advancement Now a candidate for CIR LTAC vs snf placement , repeat COVID 19 test negative x2 on 5/28 and 5/30   Consultants: Pulmonology.  Nephrology.     Medications:  Scheduled: . calcitonin  4 Units/kg Subcutaneous BID  . chlorhexidine  15 mL Mouth Rinse BID  . Chlorhexidine Gluconate Cloth  6 each Topical Daily  . Chlorhexidine Gluconate Cloth  6 each Topical Q0600  . clonazepam  0.25 mg Oral Daily  . darbepoetin (ARANESP) injection - NON-DIALYSIS  100 mcg Subcutaneous Q Sat-1800  . enoxaparin (LOVENOX) injection  60 mg Subcutaneous Q24H  . famotidine  20 mg Oral Daily  . feeding supplement (NEPRO CARB STEADY)  1,000 mL Oral Q24H  . feeding supplement (PRO-STAT SUGAR FREE 64)  30 mL Oral TID  . free water  100 mL Oral Q8H  . guaiFENesin  5 mL Oral BID  . insulin aspart  0-15 Units Subcutaneous Q4H  . insulin aspart  6 Units Subcutaneous Q4H  .  insulin glargine  10 Units Subcutaneous BID  . mouth rinse  15 mL Mouth Rinse q12n4p  . mupirocin cream   Topical BID  . oxyCODONE  5 mg Oral QHS  . pantoprazole sodium  40 mg Per Tube BID  . sodium chloride flush  10-40 mL Intracatheter Q12H  . sodium zirconium cyclosilicate  10 g Oral Daily   Continuous: . sodium chloride    . sodium chloride Stopped (03/21/19 2128)   KGM:WNUUV/OZDGUYQI arterial line **AND** sodium chloride, sodium chloride, acetaminophen, glycopyrrolate, heparin, lip balm, metoprolol tartrate, morphine injection, ondansetron (ZOFRAN) IV, oxyCODONE, Resource ThickenUp Clear, sodium chloride flush   Objective:  Vital Signs  Vitals:   04/04/19 0759 04/04/19 0810 04/04/19 1145 04/04/19 1159  BP: 125/82  127/89   Pulse: 98 98 (!) 116 (!) 118  Resp: (!) 22 18 (!) 33 (!) 26  Temp:   98 F (36.7 C)   TempSrc:   Oral   SpO2: 90% 96% 97% 96%  Weight:      Height:        Intake/Output Summary (Last 24 hours) at 04/04/2019 1205 Last data filed at 04/03/2019 2200 Gross per 24 hour  Intake 10 ml  Output 0 ml  Net 10 ml   Filed Weights   04/03/19 1200 04/03/19 1510 04/04/19 0224  Weight: 63.2 kg 63.2 kg 60.8 kg  Awake, alert, in bed, in no apparent distress , trach in place, he is getting stronger, bilateral check lesions appearing start to heal   Good air movement bilaterally, CTAB RRR,No Gallops,Rubs or new Murmurs, +ve B.Sounds, Abd Soft, No tenderness, No rebound - guarding or rigidity. No Cyanosis, Clubbing or edema    Lab Results:  Data Reviewed: I have personally reviewed following labs and imaging studies  CBC: Recent Labs  Lab 03/29/19 0500 03/30/19 0220 04/02/19 0707 04/03/19 0531 04/04/19 0559  WBC 11.3* 8.4 8.5 8.0 8.7  HGB 8.5* 9.2* 8.9* 8.9* 9.1*  HCT 26.9* 29.6* 28.0* 28.1* 28.4*  MCV 94.1 96.1 96.9 94.9 96.3  PLT 309 299 295 323 163    Basic Metabolic Panel: Recent Labs  Lab 03/28/19 2000 03/29/19 0500 03/29/19 1643  03/30/19 0220 04/01/19 0917 04/02/19 0707 04/03/19 0531 04/04/19 0559  NA 133* 135 135 136 135 134* 136 136  K 5.6* 5.6* 4.7 5.0 4.3 4.0 4.2 4.0  CL 97* 97* 98 99 95* 95* 95* 97*  CO2 24 24 27 29 29 26 24 26   GLUCOSE 151* 123* 222* 108* 136* 133* 113* 144*  BUN 114* 124* 98* 69* 105* 82* 106* 66*  CREATININE 5.85* 6.81* 5.43* 3.61* 6.51* 5.73* 6.53* 4.90*  CALCIUM 11.8* 12.1* 10.6* 10.2 12.5* 12.2* 13.1* 10.2  MG  --   --   --  3.1*  --   --   --   --   PHOS 9.0* 10.0* 6.4* 4.8*  --   --   --   --     GFR: Estimated Creatinine Clearance: 15.9 mL/min (A) (by C-G formula based on SCr of 4.9 mg/dL (H)).  Liver Function Tests: Recent Labs  Lab 03/28/19 2000 03/29/19 0500 03/29/19 1643 03/30/19 0220  ALBUMIN 2.8* 2.7* 2.8* 2.8*    CBG: Recent Labs  Lab 04/03/19 2006 04/03/19 2350 04/04/19 0343 04/04/19 0810 04/04/19 1145  GLUCAP 113* 100* 126* 128* 119*     Recent Results (from the past 240 hour(s))  SARS Coronavirus 2 (CEPHEID- Performed in [place] hospital lab), Hosp Order     Status: None   Collection Time: 04/02/19 12:40 PM  Result Value Ref Range Status   SARS Coronavirus 2 NEGATIVE NEGATIVE Final    Comment: (NOTE) If result is NEGATIVE SARS-CoV-2 target nucleic acids are NOT DETECTED. The SARS-CoV-2 RNA is generally detectable in upper and lower  respiratory specimens during the acute phase of infection. The lowest  concentration of SARS-CoV-2 viral copies this assay can detect is 250  copies / mL. A negative result does not preclude SARS-CoV-2 infection  and should not be used as the sole basis for treatment or other  patient management decisions.  A negative result may occur with  improper specimen collection / handling, submission of specimen other  than nasopharyngeal swab, presence of viral mutation(s) within the  areas targeted by this assay, and inadequate number of viral copies  (<250 copies / mL). A negative result must be combined with  clinical  observations, patient history, and epidemiological information. If result is POSITIVE SARS-CoV-2 target nucleic acids are DETECTED. The SARS-CoV-2 RNA is generally detectable in upper and lower  respiratory specimens dur ing the acute phase of infection.  Positive  results are indicative of active infection with SARS-CoV-2.  Clinical  correlation with patient history and other diagnostic information is  necessary to determine patient infection status.  Positive results do  not rule out bacterial infection or co-infection with other  viruses. If result is PRESUMPTIVE POSTIVE SARS-CoV-2 nucleic acids MAY BE PRESENT.   A presumptive positive result was obtained on the submitted specimen  and confirmed on repeat testing.  While 2019 novel coronavirus  (SARS-CoV-2) nucleic acids may be present in the submitted sample  additional confirmatory testing may be necessary for epidemiological  and / or clinical management purposes  to differentiate between  SARS-CoV-2 and other Sarbecovirus currently known to infect humans.  If clinically indicated additional testing with an alternate test  methodology [phone]) is advised. The SARS-CoV-2 RNA is generally  detectable in upper and lower respiratory sp ecimens during the acute  phase of infection. The expected result is Negative. Fact Sheet for Patients:  StrictlyIdeas.no Fact Sheet for Healthcare Providers: BankingDealers.co.za This test is not yet approved or cleared by the [place] FDA and has been authorized for detection and/or diagnosis of SARS-CoV-2 by FDA under an Emergency Use Authorization (EUA).  This EUA will remain in effect (meaning this test can be used) for the duration of the COVID-19 declaration under Section 564(b)(1) of the Act, 21 U.S.C. section 360bbb-3(b)(1), unless the authorization is terminated or revoked sooner. Performed at [facility], [place]  [address] [place], [place] [address]   SARS Coronavirus 2 (CEPHEID- Performed in [place] hospital lab), Hosp Order     Status: None   Collection Time: 04/03/19 11:28 AM  Result Value Ref Range Status   SARS Coronavirus 2 NEGATIVE NEGATIVE Final    Comment: (NOTE) If result is NEGATIVE SARS-CoV-2 target nucleic acids are NOT DETECTED. The SARS-CoV-2 RNA is generally detectable in upper and lower  respiratory specimens during the acute phase of infection. The lowest  concentration of SARS-CoV-2 viral copies this assay can detect is 250  copies / mL. A negative result does not preclude SARS-CoV-2 infection  and should not be used as the sole basis for treatment or other  patient management decisions.  A negative result may occur with  improper specimen collection / handling, submission of specimen other  than nasopharyngeal swab, presence of viral mutation(s) within the  areas targeted by this assay, and inadequate number of viral copies  (<250 copies / mL). A negative result must be combined with clinical  observations, patient history, and epidemiological information. If result is POSITIVE SARS-CoV-2 target nucleic acids are DETECTED. The SARS-CoV-2 RNA is generally detectable in upper and lower  respiratory specimens dur ing the acute phase of infection.  Positive  results are indicative of active infection with SARS-CoV-2.  Clinical  correlation with patient history and other diagnostic information is  necessary to determine patient infection status.  Positive results do  not rule out bacterial infection or co-infection with other viruses. If result is PRESUMPTIVE POSTIVE SARS-CoV-2 nucleic acids MAY BE PRESENT.   A presumptive positive result was obtained on the submitted specimen  and confirmed on repeat testing.  While 2019 novel coronavirus  (SARS-CoV-2) nucleic acids may be present in the submitted sample  additional confirmatory testing may be necessary for epidemiological   and / or clinical management purposes  to differentiate between  SARS-CoV-2 and other Sarbecovirus currently known to infect humans.  If clinically indicated additional testing with an alternate test  methodology [phone]) is advised. The SARS-CoV-2 RNA is generally  detectable in upper and lower respiratory sp ecimens during the acute  phase of infection. The expected result is Negative. Fact Sheet for Patients:  StrictlyIdeas.no Fact Sheet for Healthcare Providers: BankingDealers.co.za This test is not yet  approved or cleared by the [place] and has been authorized for detection and/or diagnosis of SARS-CoV-2 by FDA under an Emergency Use Authorization (EUA).  This EUA will remain in effect (meaning this test can be used) for the duration of the COVID-19 declaration under Section 564(b)(1) of the Act, 21 U.S.C. section 360bbb-3(b)(1), unless the authorization is terminated or revoked sooner. Performed at [facility], [place] [address], [place], [place] [address]       Radiology Studies: Dg Abd 1 View  Result Date: 04/03/2019 CLINICAL DATA:  Hypoxia, nausea, vomiting EXAM: ABDOMEN - 1 VIEW COMPARISON:  04/02/2019 FINDINGS: Oral contrast material noted within the colon which is decompressed. Nonobstructive bowel gas pattern. No organomegaly or free air. IMPRESSION: No evidence of bowel obstruction or free air.  No acute findings. Electronically Signed   By: Rolm Baptise M.D.   On: 04/03/2019 21:20   Dg Chest Port 1 View  Result Date: 04/03/2019 CLINICAL DATA:  Hypoxia, nausea, vomiting EXAM: PORTABLE CHEST 1 VIEW COMPARISON:  03/25/2019 FINDINGS: Patchy bilateral airspace disease again noted, minimally improved since prior study. Low lung volumes. Right dialysis catheter remains in the right atrium. Tracheostomy tube is unchanged. Interval removal of NG tube. Heart is normal size. IMPRESSION: Low lung volumes. Patchy  bilateral airspace disease, slightly improved. Electronically Signed   By: Rolm Baptise M.D.   On: 04/03/2019 21:20   Dg Abd Portable 1v  Result Date: 04/02/2019 CLINICAL DATA:  Abdominal pain EXAM: PORTABLE ABDOMEN - 1 VIEW COMPARISON:  03/31/2019 FINDINGS: Nonobstructed bowel gas pattern with residual contrast material in nondilated small bowel and colon. This limits evaluation for radiopaque calculi. IMPRESSION: Nonobstructed gas pattern with residual contrast material in the large and small bowel Electronically Signed   By: Donavan Foil M.D.   On: 04/02/2019 20:41   Dg Swallowing Func-speech Pathology  Result Date: 04/02/2019 Objective Swallowing Evaluation: Type of Study: MBS-Modified Barium Swallow Study  Patient Details Name: Jonathan [name] MRN: [id number] Date of Birth: 1970/12/11 Today's Date: 04/02/2019 Time: SLP Start Time (ACUTE ONLY): 1600 -SLP Stop Time (ACUTE ONLY): 1630 SLP Time Calculation (min) (ACUTE ONLY): 30 min Past Medical History: No past medical history on file. Past Surgical History:  The histories are not reviewed yet. Please review them in the "History" navigator section and refresh this Price. HPI: Pt is a 49 y.o. male admitted on 02/23/19 with AMS, O2 sats in the ED were 50%.  Pt dx with COVID 19 and resultant ARDS.  He was intubated 4/21-5/11; reintubated and then trached on 5/12. His course is complicated by acute kidney injury requiring CVVHD.  PMH includes DM2, HTN.  Subjective: pt was upright in his chair, cooperative Assessment / Plan / Recommendation CHL IP CLINICAL IMPRESSIONS 04/02/2019 Clinical Impression Pt has a mild-moderate oropharyngeal dysphagia, with testing done without PMV in place as it has not been relocated s/p transfer to [name]. Despite this, he demonstrates good airway protection with solids and nectar thick liquids. Thin liquids intermittently entered the airway in trace-mild amounts, but aspiration was silent. His swallow overall appeared mildly  slowed and with reduced strenght. Lingual and vallecular residue was present, with pt performing a second swallow spontaneously to reduce residue in both locations. UES is tight with mild retrograde flow within the cervical esophagus that does not re-enter the pharynx during this study. Recommend starting with Dys 1 diet and nectar thick liquids. PMV not needed for intake, but SLP will f/u with pt to replace and continue  to facilitate communication.  SLP Visit Diagnosis Dysphagia, oropharyngeal phase (R13.12) Attention and concentration deficit following -- Frontal lobe and executive function deficit following -- Impact on safety and function Mild aspiration risk   CHL IP TREATMENT RECOMMENDATION 04/02/2019 Treatment Recommendations Therapy as outlined in treatment plan below   Prognosis 04/02/2019 Prognosis for Safe Diet Advancement Good Barriers to Reach Goals -- Barriers/Prognosis Comment -- CHL IP DIET RECOMMENDATION 04/02/2019 SLP Diet Recommendations Dysphagia 1 (Puree) solids;Nectar thick liquid Liquid Administration via Cup;Straw Medication Administration Crushed with puree Compensations Slow rate;Small sips/bites;Multiple dry swallows after each bite/sip Postural Changes Seated upright at 90 degrees;Remain semi-upright after after feeds/meals (Comment)   CHL IP OTHER RECOMMENDATIONS 04/02/2019 Recommended Consults -- Oral Care Recommendations Oral care BID Other Recommendations Order thickener from pharmacy;Prohibited food (jello, ice cream, thin soups);Remove water pitcher   CHL IP FOLLOW UP RECOMMENDATIONS 04/02/2019 Follow up Recommendations Inpatient Rehab   CHL IP FREQUENCY AND DURATION 04/02/2019 Speech Therapy Frequency (ACUTE ONLY) min 2x/week Treatment Duration 2 weeks      CHL IP ORAL PHASE 04/02/2019 Oral Phase Impaired Oral - Pudding Teaspoon -- Oral - Pudding Cup -- Oral - Honey Teaspoon -- Oral - Honey Cup -- Oral - Nectar Teaspoon -- Oral - Nectar Cup -- Oral - Nectar Straw Weak lingual  manipulation;Lingual/palatal residue Oral - Thin Teaspoon Weak lingual manipulation;Lingual/palatal residue Oral - Thin Cup -- Oral - Thin Straw Weak lingual manipulation;Lingual/palatal residue Oral - Puree Weak lingual manipulation;Lingual/palatal residue Oral - Mech Soft Weak lingual manipulation;Lingual/palatal residue Oral - Regular -- Oral - Multi-Consistency -- Oral - Pill -- Oral Phase - Comment --  CHL IP PHARYNGEAL PHASE 04/02/2019 Pharyngeal Phase Impaired Pharyngeal- Pudding Teaspoon -- Pharyngeal -- Pharyngeal- Pudding Cup -- Pharyngeal -- Pharyngeal- Honey Teaspoon -- Pharyngeal -- Pharyngeal- Honey Cup -- Pharyngeal -- Pharyngeal- Nectar Teaspoon -- Pharyngeal -- Pharyngeal- Nectar Cup -- Pharyngeal -- Pharyngeal- Nectar Straw Reduced anterior laryngeal mobility;Reduced laryngeal elevation;Reduced tongue base retraction;Reduced airway/laryngeal closure;Pharyngeal residue - valleculae Pharyngeal -- Pharyngeal- Thin Teaspoon Reduced anterior laryngeal mobility;Reduced laryngeal elevation;Reduced tongue base retraction;Reduced airway/laryngeal closure;Pharyngeal residue - valleculae Pharyngeal -- Pharyngeal- Thin Cup -- Pharyngeal -- Pharyngeal- Thin Straw Reduced anterior laryngeal mobility;Reduced laryngeal elevation;Reduced tongue base retraction;Reduced airway/laryngeal closure;Pharyngeal residue - valleculae;Penetration/Aspiration during swallow Pharyngeal Material enters airway, passes BELOW cords without attempt by patient to eject out (silent aspiration) Pharyngeal- Puree Reduced anterior laryngeal mobility;Reduced laryngeal elevation;Reduced tongue base retraction;Reduced airway/laryngeal closure;Pharyngeal residue - valleculae Pharyngeal -- Pharyngeal- Mechanical Soft Reduced anterior laryngeal mobility;Reduced laryngeal elevation;Reduced tongue base retraction;Reduced airway/laryngeal closure;Pharyngeal residue - valleculae Pharyngeal -- Pharyngeal- Regular -- Pharyngeal -- Pharyngeal-  Multi-consistency -- Pharyngeal -- Pharyngeal- Pill -- Pharyngeal -- Pharyngeal Comment --  CHL IP CERVICAL ESOPHAGEAL PHASE 04/02/2019 Cervical Esophageal Phase Impaired Pudding Teaspoon -- Pudding Cup -- Honey Teaspoon -- Honey Cup -- Nectar Teaspoon -- Nectar Cup -- Nectar Straw Reduced cricopharyngeal relaxation;Esophageal backflow into cervical esophagus Thin Teaspoon Reduced cricopharyngeal relaxation;Esophageal backflow into cervical esophagus Thin Cup -- Thin Straw Reduced cricopharyngeal relaxation;Esophageal backflow into cervical esophagus Puree Reduced cricopharyngeal relaxation Mechanical Soft Reduced cricopharyngeal relaxation Regular -- Multi-consistency -- Pill -- Cervical Esophageal Comment -- Venita Sheffield Nix 04/02/2019, 4:43 PM  Pollyann Glen, M.A. [facility] Pager [phone] Office [phone]                 LOS: 4 days   Florencia Reasons MD PhD  [facility] Pager on [email or web address]  04/04/2019, 12:05 PM

## 2019-04-04 NOTE — Progress Notes (Signed)
Patient ID: Jonathan [name], male   DOB: 02/22/1971, 48 y.o.   MRN: [id number] S: Having persistent N/V O:BP 125/82 (BP Location: Left Arm)   Pulse 98   Temp 99.5 F (37.5 C) (Oral)   Resp 18   Ht 5\' 5"  (1.651 m)   Wt 60.8 kg   SpO2 96%   BMI 22.31 kg/m   Intake/Output Summary (Last 24 hours) at 04/04/2019 1136 Last data filed at 04/03/2019 2200 Gross per 24 hour  Intake 10 ml  Output 0 ml  Net 10 ml   Intake/Output: I/O last 3 completed shifts: In: 10 [I.V.:10] Out: 850 [Urine:850]  Intake/Output this shift:  No intake/output data recorded. Weight change: 0 kg Direct physical contact was not performed out of concern for covid-19 infection.  Utilizing exam of the primary team and observations of RN's.   Recent Labs  Lab 03/28/19 2000 03/29/19 0500 03/29/19 1643 03/30/19 0220 04/01/19 0917 04/02/19 0707 04/03/19 0531 04/04/19 0559  NA 133* 135 135 136 135 134* 136 136  K 5.6* 5.6* 4.7 5.0 4.3 4.0 4.2 4.0  CL 97* 97* 98 99 95* 95* 95* 97*  CO2 24 24 27 29 29 26 24 26   GLUCOSE 151* 123* 222* 108* 136* 133* 113* 144*  BUN 114* 124* 98* 69* 105* 82* 106* 66*  CREATININE 5.85* 6.81* 5.43* 3.61* 6.51* 5.73* 6.53* 4.90*  ALBUMIN 2.8* 2.7* 2.8* 2.8*  --   --   --   --   CALCIUM 11.8* 12.1* 10.6* 10.2 12.5* 12.2* 13.1* 10.2  PHOS 9.0* 10.0* 6.4* 4.8*  --   --   --   --    Liver Function Tests: Recent Labs  Lab 03/29/19 0500 03/29/19 1643 03/30/19 0220  ALBUMIN 2.7* 2.8* 2.8*   No results for input(s): LIPASE, AMYLASE in the last 168 hours. No results for input(s): AMMONIA in the last 168 hours. CBC: Recent Labs  Lab 03/29/19 0500 03/30/19 0220 04/02/19 0707 04/03/19 0531 04/04/19 0559  WBC 11.3* 8.4 8.5 8.0 8.7  HGB 8.5* 9.2* 8.9* 8.9* 9.1*  HCT 26.9* 29.6* 28.0* 28.1* 28.4*  MCV 94.1 96.1 96.9 94.9 96.3  PLT 309 299 295 323 282   Cardiac Enzymes: No results for input(s): CKTOTAL, CKMB, CKMBINDEX, TROPONINI in the last 168 hours. CBG: Recent Labs   Lab 04/03/19 1840 04/03/19 2006 04/03/19 2350 04/04/19 0343 04/04/19 0810  GLUCAP 163* 113* 100* 126* 128*    Iron Studies: No results for input(s): IRON, TIBC, TRANSFERRIN, FERRITIN in the last 72 hours. Studies/Results: Dg Abd 1 View  Result Date: 04/03/2019 CLINICAL DATA:  Hypoxia, nausea, vomiting EXAM: ABDOMEN - 1 VIEW COMPARISON:  04/02/2019 FINDINGS: Oral contrast material noted within the colon which is decompressed. Nonobstructive bowel gas pattern. No organomegaly or free air. IMPRESSION: No evidence of bowel obstruction or free air.  No acute findings. Electronically Signed   By: Rolm Baptise M.D.   On: 04/03/2019 21:20   Dg Chest Port 1 View  Result Date: 04/03/2019 CLINICAL DATA:  Hypoxia, nausea, vomiting EXAM: PORTABLE CHEST 1 VIEW COMPARISON:  03/25/2019 FINDINGS: Patchy bilateral airspace disease again noted, minimally improved since prior study. Low lung volumes. Right dialysis catheter remains in the right atrium. Tracheostomy tube is unchanged. Interval removal of NG tube. Heart is normal size. IMPRESSION: Low lung volumes. Patchy bilateral airspace disease, slightly improved. Electronically Signed   By: Rolm Baptise M.D.   On: 04/03/2019 21:20   Dg Abd Portable 1v  Result Date: 04/02/2019 CLINICAL  DATA:  Abdominal pain EXAM: PORTABLE ABDOMEN - 1 VIEW COMPARISON:  03/31/2019 FINDINGS: Nonobstructed bowel gas pattern with residual contrast material in nondilated small bowel and colon. This limits evaluation for radiopaque calculi. IMPRESSION: Nonobstructed gas pattern with residual contrast material in the large and small bowel Electronically Signed   By: Donavan Foil M.D.   On: 04/02/2019 20:41   Dg Swallowing Func-speech Pathology  Result Date: 04/02/2019 Objective Swallowing Evaluation: Type of Study: MBS-Modified Barium Swallow Study  Patient Details Name: Jonathan [name] MRN: [id number] Date of Birth: 1971/01/05 Today's Date: 04/02/2019 Time: SLP Start Time  (ACUTE ONLY): 1600 -SLP Stop Time (ACUTE ONLY): 1630 SLP Time Calculation (min) (ACUTE ONLY): 30 min Past Medical History: No past medical history on file. Past Surgical History:  The histories are not reviewed yet. Please review them in the "History" navigator section and refresh this [place]. HPI: Pt is a 48 y.o. male admitted on 02/23/19 with AMS, O2 sats in the ED were 50%.  Pt dx with COVID 19 and resultant ARDS.  He was intubated 4/21-5/11; reintubated and then trached on 5/12. His course is complicated by acute kidney injury requiring CVVHD.  PMH includes DM2, HTN.  Subjective: pt was upright in his chair, cooperative Assessment / Plan / Recommendation CHL IP CLINICAL IMPRESSIONS 04/02/2019 Clinical Impression Pt has a mild-moderate oropharyngeal dysphagia, with testing done without PMV in place as it has not been relocated s/p transfer to [facility]. Despite this, he demonstrates good airway protection with solids and nectar thick liquids. Thin liquids intermittently entered the airway in trace-mild amounts, but aspiration was silent. His swallow overall appeared mildly slowed and with reduced strenght. Lingual and vallecular residue was present, with pt performing a second swallow spontaneously to reduce residue in both locations. UES is tight with mild retrograde flow within the cervical esophagus that does not re-enter the pharynx during this study. Recommend starting with Dys 1 diet and nectar thick liquids. PMV not needed for intake, but SLP will f/u with pt to replace and continue to facilitate communication.  SLP Visit Diagnosis Dysphagia, oropharyngeal phase (R13.12) Attention and concentration deficit following -- Frontal lobe and executive function deficit following -- Impact on safety and function Mild aspiration risk   CHL IP TREATMENT RECOMMENDATION 04/02/2019 Treatment Recommendations Therapy as outlined in treatment plan below   Prognosis 04/02/2019 Prognosis for Safe Diet Advancement Good Barriers to  Reach Goals -- Barriers/Prognosis Comment -- CHL IP DIET RECOMMENDATION 04/02/2019 SLP Diet Recommendations Dysphagia 1 (Puree) solids;Nectar thick liquid Liquid Administration via Cup;Straw Medication Administration Crushed with puree Compensations Slow rate;Small sips/bites;Multiple dry swallows after each bite/sip Postural Changes Seated upright at 90 degrees;Remain semi-upright after after feeds/meals (Comment)   CHL IP OTHER RECOMMENDATIONS 04/02/2019 Recommended Consults -- Oral Care Recommendations Oral care BID Other Recommendations Order thickener from pharmacy;Prohibited food (jello, ice cream, thin soups);Remove water pitcher   CHL IP FOLLOW UP RECOMMENDATIONS 04/02/2019 Follow up Recommendations Inpatient Rehab   CHL IP FREQUENCY AND DURATION 04/02/2019 Speech Therapy Frequency (ACUTE ONLY) min 2x/week Treatment Duration 2 weeks      CHL IP ORAL PHASE 04/02/2019 Oral Phase Impaired Oral - Pudding Teaspoon -- Oral - Pudding Cup -- Oral - Honey Teaspoon -- Oral - Honey Cup -- Oral - Nectar Teaspoon -- Oral - Nectar Cup -- Oral - Nectar Straw Weak lingual manipulation;Lingual/palatal residue Oral - Thin Teaspoon Weak lingual manipulation;Lingual/palatal residue Oral - Thin Cup -- Oral - Thin Straw Weak lingual manipulation;Lingual/palatal residue Oral - Puree Weak lingual  manipulation;Lingual/palatal residue Oral - Mech Soft Weak lingual manipulation;Lingual/palatal residue Oral - Regular -- Oral - Multi-Consistency -- Oral - Pill -- Oral Phase - Comment --  CHL IP PHARYNGEAL PHASE 04/02/2019 Pharyngeal Phase Impaired Pharyngeal- Pudding Teaspoon -- Pharyngeal -- Pharyngeal- Pudding Cup -- Pharyngeal -- Pharyngeal- Honey Teaspoon -- Pharyngeal -- Pharyngeal- Honey Cup -- Pharyngeal -- Pharyngeal- Nectar Teaspoon -- Pharyngeal -- Pharyngeal- Nectar Cup -- Pharyngeal -- Pharyngeal- Nectar Straw Reduced anterior laryngeal mobility;Reduced laryngeal elevation;Reduced tongue base retraction;Reduced airway/laryngeal  closure;Pharyngeal residue - valleculae Pharyngeal -- Pharyngeal- Thin Teaspoon Reduced anterior laryngeal mobility;Reduced laryngeal elevation;Reduced tongue base retraction;Reduced airway/laryngeal closure;Pharyngeal residue - valleculae Pharyngeal -- Pharyngeal- Thin Cup -- Pharyngeal -- Pharyngeal- Thin Straw Reduced anterior laryngeal mobility;Reduced laryngeal elevation;Reduced tongue base retraction;Reduced airway/laryngeal closure;Pharyngeal residue - valleculae;Penetration/Aspiration during swallow Pharyngeal Material enters airway, passes BELOW cords without attempt by patient to eject out (silent aspiration) Pharyngeal- Puree Reduced anterior laryngeal mobility;Reduced laryngeal elevation;Reduced tongue base retraction;Reduced airway/laryngeal closure;Pharyngeal residue - valleculae Pharyngeal -- Pharyngeal- Mechanical Soft Reduced anterior laryngeal mobility;Reduced laryngeal elevation;Reduced tongue base retraction;Reduced airway/laryngeal closure;Pharyngeal residue - valleculae Pharyngeal -- Pharyngeal- Regular -- Pharyngeal -- Pharyngeal- Multi-consistency -- Pharyngeal -- Pharyngeal- Pill -- Pharyngeal -- Pharyngeal Comment --  CHL IP CERVICAL ESOPHAGEAL PHASE 04/02/2019 Cervical Esophageal Phase Impaired Pudding Teaspoon -- Pudding Cup -- Honey Teaspoon -- Honey Cup -- Nectar Teaspoon -- Nectar Cup -- Nectar Straw Reduced cricopharyngeal relaxation;Esophageal backflow into cervical esophagus Thin Teaspoon Reduced cricopharyngeal relaxation;Esophageal backflow into cervical esophagus Thin Cup -- Thin Straw Reduced cricopharyngeal relaxation;Esophageal backflow into cervical esophagus Puree Reduced cricopharyngeal relaxation Mechanical Soft Reduced cricopharyngeal relaxation Regular -- Multi-consistency -- Pill -- Cervical Esophageal Comment -- Venita Sheffield Nix 04/02/2019, 4:43 PM  Pollyann Glen, M.A. CCC-SLP Acute Rehabilitation Services Pager [phone] Office [phone]             . calcitonin  4  Units/kg Subcutaneous BID  . chlorhexidine  15 mL Mouth Rinse BID  . Chlorhexidine Gluconate Cloth  6 each Topical Daily  . Chlorhexidine Gluconate Cloth  6 each Topical Q0600  . clonazepam  0.25 mg Oral Daily  . darbepoetin (ARANESP) injection - NON-DIALYSIS  100 mcg Subcutaneous Q Sat-1800  . enoxaparin (LOVENOX) injection  60 mg Subcutaneous Q24H  . famotidine  20 mg Oral Daily  . feeding supplement (NEPRO CARB STEADY)  1,000 mL Oral Q24H  . feeding supplement (PRO-STAT SUGAR FREE 64)  30 mL Oral TID  . free water  100 mL Oral Q8H  . guaiFENesin  5 mL Oral BID  . insulin aspart  0-15 Units Subcutaneous Q4H  . insulin aspart  6 Units Subcutaneous Q4H  . insulin glargine  10 Units Subcutaneous BID  . mouth rinse  15 mL Mouth Rinse q12n4p  . mupirocin cream   Topical BID  . oxyCODONE  5 mg Oral BID  . sodium chloride flush  10-40 mL Intracatheter Q12H  . sodium zirconium cyclosilicate  10 g Oral Daily    BMET    Component Value Date/Time   NA 136 04/04/2019 0559   K 4.0 04/04/2019 0559   CL 97 (L) 04/04/2019 0559   CO2 26 04/04/2019 0559   GLUCOSE 144 (H) 04/04/2019 0559   BUN 66 (H) 04/04/2019 0559   CREATININE 4.90 (H) 04/04/2019 0559   CALCIUM 10.2 04/04/2019 0559   GFRNONAA 13 (L) 04/04/2019 0559   GFRAA 15 (L) 04/04/2019 0559   CBC    Component Value Date/Time   WBC 8.7 04/04/2019 0559   RBC  2.95 (L) 04/04/2019 0559   HGB 9.1 (L) 04/04/2019 0559   HCT 28.4 (L) 04/04/2019 0559   PLT 282 04/04/2019 0559   MCV 96.3 04/04/2019 0559   MCH 30.8 04/04/2019 0559   MCHC 32.0 04/04/2019 0559   RDW 15.2 04/04/2019 0559   LYMPHSABS 3.1 03/14/2019 1206   MONOABS 2.2 (H) 03/14/2019 1206   EOSABS 0.3 03/14/2019 1206   BASOSABS 0.2 (H) 03/14/2019 1206   Brief HPI: Pt is a 48 y.o. yo male who was admitted on 02/23/2019 with anuric AKI due to ATN in the setting of COVID   Assessment/Plan:  1. AKI in setting of covid-19 infection.  Started on CVVHD on 02/23/19 and  transitioned to IHD on 04/01/19.  Using [facility] for dialysis but is now starting to have an increase in UOP.  S/p HD yesterday and kept even. 2. covid-19 infection.  Improving 3. Acute hypoxic respiratory failure s/p trach and off of vent since 03/25/19 4. Anemia- due to acute illness. S/p transfusion on 03/28/19 and started on Aranesp 03/27/19 5. Hypercalcemia- using low Ca dialysate.  Possibly due to immobilization.   Donetta Potts, MD [employer] [phone]

## 2019-04-04 NOTE — Progress Notes (Signed)
Pt vomited unprovoked. On call hosp paged re: pharmaceutical intervention

## 2019-04-04 NOTE — Progress Notes (Signed)
Pt desats to 80's during patient care & movement.

## 2019-04-04 NOTE — Progress Notes (Signed)
Pt had vomited w/ deep suction w/ RT, NT present. New dressing to HD cath.

## 2019-04-05 DIAGNOSIS — J9601 Acute respiratory failure with hypoxia: Secondary | ICD-10-CM

## 2019-04-05 LAB — GLUCOSE, CAPILLARY
Glucose-Capillary: 135 mg/dL — ABNORMAL HIGH (ref 70–99)
Glucose-Capillary: 121 mg/dL — ABNORMAL HIGH (ref 70–99)
Glucose-Capillary: 235 mg/dL — ABNORMAL HIGH (ref 70–99)
Glucose-Capillary: 109 mg/dL — ABNORMAL HIGH (ref 70–99)
Glucose-Capillary: 214 mg/dL — ABNORMAL HIGH (ref 70–99)

## 2019-04-05 LAB — CBC
HCT: 28 % — ABNORMAL LOW (ref 39.0–52.0)
Hemoglobin: 9.2 g/dL — ABNORMAL LOW (ref 13.0–17.0)
MCH: 31.7 pg (ref 26.0–34.0)
MCHC: 32.9 g/dL (ref 30.0–36.0)
MCV: 96.6 fL (ref 80.0–100.0)
Platelets: 301 10*3/uL (ref 150–400)
RBC: 2.9 MIL/uL — ABNORMAL LOW (ref 4.22–5.81)
RDW: 15 % (ref 11.5–15.5)
WBC: 10.1 10*3/uL (ref 4.0–10.5)
nRBC: 0 % (ref 0.0–0.2)

## 2019-04-05 LAB — RENAL FUNCTION PANEL
Albumin: 3.1 g/dL — ABNORMAL LOW (ref 3.5–5.0)
Anion gap: 15 (ref 5–15)
BUN: 84 mg/dL — ABNORMAL HIGH (ref 6–20)
CO2: 24 mmol/L (ref 22–32)
Calcium: 10.8 mg/dL — ABNORMAL HIGH (ref 8.9–10.3)
Chloride: 97 mmol/L — ABNORMAL LOW (ref 98–111)
Creatinine, Ser: 5.41 mg/dL — ABNORMAL HIGH (ref 0.61–1.24)
GFR calc Af Amer: 13 mL/min — ABNORMAL LOW (ref >60)
GFR calc non Af Amer: 12 mL/min — ABNORMAL LOW (ref >60)
Glucose, Bld: 138 mg/dL — ABNORMAL HIGH (ref 70–99)
Phosphorus: 8.3 mg/dL — ABNORMAL HIGH (ref 2.5–4.6)
Potassium: 3.4 mmol/L — ABNORMAL LOW (ref 3.5–5.1)
Sodium: 136 mmol/L (ref 135–145)

## 2019-04-05 LAB — VITAMIN D 25 HYDROXY (VIT D DEFICIENCY, FRACTURES): Vit D, 25-Hydroxy: 12.3 ng/mL — ABNORMAL LOW (ref 30.0–100.0)

## 2019-04-05 LAB — HEPARIN LEVEL (UNFRACTIONATED): Heparin Unfractionated: 0.35 IU/mL (ref 0.30–0.70)

## 2019-04-05 MED ORDER — CHLORHEXIDINE GLUCONATE CLOTH 2 % EX PADS
6.0000 | MEDICATED_PAD | Freq: Every day | CUTANEOUS | Status: DC
  Administered 2019-04-06 – 2019-04-19 (×10): 6 via TOPICAL

## 2019-04-05 MED ORDER — CHLORHEXIDINE GLUCONATE CLOTH 2 % EX PADS
6.0000 | MEDICATED_PAD | Freq: Every day | CUTANEOUS | Status: DC
  Administered 2019-04-06 – 2019-04-10 (×4): 6 via TOPICAL

## 2019-04-05 NOTE — Progress Notes (Signed)
Physical Therapy Treatment Patient Details Name: Jonathan [name] MRN: [id number] DOB: [birth year] Today's Date: 04/05/2019    History of Present Illness 48 y.o. male admitted on 02/23/19 with AMS, O2 sats in the ED were 50%.  Pt dx with COVID 19 and resultant ARDS.  He was intubated 4/21-5/11. Reintubated and trached 5/12. Off vent and on Tbar since 5/21.  His course is complicated by acute kidney injury requiring CVVHD.  Pt transferred back to [facility] on 5/26 and changed to intermittent HD.     PT Comments    Continuing work on functional mobility and activity tolerance;  Pt continues to make steady progress with therapies. Pt able to progress with mobility today, completing further distance using RW with overall minA (+2 safety/equipment). Pt does fatigue easily and desats with activity requiring seated rest breaks throughout session -- but with brief seated rest breaks, he was able to participate in a 44 minute session very well.   Follow Up Recommendations  CIR     Equipment Recommendations  Rolling walker with 5" wheels;3in1 (PT)    Recommendations for Other Services Rehab consult     Precautions / Restrictions Precautions Precautions: Fall Precaution Comments: R IJ HD catheter, on /trach/T bar, NGT Restrictions Weight Bearing Restrictions: No    Mobility  Bed Mobility Overal bed mobility: Needs Assistance Bed Mobility: Supine to Sit     Supine to sit: Min assist;HOB elevated     General bed mobility comments: Assist to elevate trunk into sitting, to ensure safety with lines  Transfers Overall transfer level: Needs assistance Equipment used: Rolling walker (2 wheeled);2 person hand held assist Transfers: Sit to/from Stand Sit to Stand: Min assist;+2 safety/equipment         General transfer comment: Verbal/visual cues for safe hand placement; light assist to rise and steady at [name]  Ambulation/Gait Ambulation/Gait assistance: Min assist;+2  safety/equipment Gait Distance (Feet): 60 Feet(with 2 seated rest breaks) Assistive device: Rolling walker (2 wheeled) Gait Pattern/deviations: Step-through pattern;Decreased step length - right;Decreased step length - left;Narrow base of support Gait velocity: decr   General Gait Details: Assist for balance and support; Walked on supplemental O2 55% with trach; O2 sats would drop to mid 80s with amb, and incr back to low 90s with seated rest   Stairs             Wheelchair Mobility    Modified Rankin (Stroke Patients Only)       Balance Overall balance assessment: Needs assistance Sitting-balance support: Feet unsupported;No upper extremity supported Sitting balance-Leahy Scale: Fair     Standing balance support: Bilateral upper extremity supported Standing balance-Leahy Scale: Poor Standing balance comment: walker and min assist for static standing                            Cognition Arousal/Alertness: Awake/alert Behavior During Therapy: WFL for tasks assessed/performed Overall Cognitive Status: Difficult to assess                                 General Comments: pt following commands consistently; appears to have increased awareness       Exercises      General Comments General comments (skin integrity, edema, etc.): Pt on trach collar at 55%, lowest SpO2 83% with activity, rebounds within approx 1 min to 90% and above; max RR low 40s and max HR up to  mid 120s      Pertinent Vitals/Pain Pain Assessment: No/denies pain    Home Living                      Prior Function            PT Goals (current goals can now be found in the care plan section) Acute Rehab PT Goals Patient Stated Goal: to walk into the hallway PT Goal Formulation: Patient unable to participate in goal setting Time For Goal Achievement: 04/13/19 Potential to Achieve Goals: Good Progress towards PT goals: Progressing toward goals     Frequency    Min 3X/week      PT Plan Current plan remains appropriate    Co-evaluation PT/OT/SLP Co-Evaluation/Treatment: Yes Reason for Co-Treatment: For patient/therapist safety;To address functional/ADL transfers PT goals addressed during session: Mobility/safety with mobility OT goals addressed during session: ADL's and self-care      AM-PAC PT "6 Clicks" Mobility   Outcome Measure  Help needed turning from your back to your side while in a flat bed without using bedrails?: A Little Help needed moving from lying on your back to sitting on the side of a flat bed without using bedrails?: A Little Help needed moving to and from a bed to a chair (including a wheelchair)?: A Little Help needed standing up from a chair using your arms (e.g., wheelchair or bedside chair)?: A Little Help needed to walk in hospital room?: A Lot Help needed climbing 3-5 steps with a railing? : Total 6 Click Score: 15    End of Session Equipment Utilized During Treatment: Oxygen Activity Tolerance: Patient tolerated treatment well Patient left: in chair;with call bell/phone within reach;with chair alarm set Nurse Communication: Mobility status PT Visit Diagnosis: Muscle weakness (generalized) (M62.81);Difficulty in walking, not elsewhere classified (R26.2)     Time: 7564-3329 PT Time Calculation (min) (ACUTE ONLY): 44 min  Charges:  $Gait Training: 23-37 mins                     Roney Marion, [place]  Acute Rehabilitation Services Pager [phone] Office [place] 04/05/2019, 4:23 PM

## 2019-04-05 NOTE — Progress Notes (Signed)
RT at bedside patient vomited.  Patient vomited in bag RN also at beside.  Patient not in distress vitals stable.

## 2019-04-05 NOTE — Progress Notes (Signed)
NAME:  Jonathan [name], MRN:  [id number], DOB:  [birth year], LOS: 30 ADMISSION DATE:  02/23/2019, CONSULTATION DATE: February 23, 2019 REFERRING MD: Regenia Skeeter, CHIEF COMPLAINT: Dyspnea  Brief History   48 year old male admitted on February 23, 2019 with ARDS due to COVID-19.  He has largely recovered from severe ARDS which originally required prone positioning for many days.  Developed acute kidney injury requiring hemodialysis.  Has been liberated from the ventilator as of Mar 25, 2019.    Past Medical History  Diabetes mellitus type 2 Hypertension  Significant Hospital Events   4/21 admitted and immediately proned on inverse ratio PC ventilation with high PEEP to maintain saturation >85%, Started on NO at 40ppm. 4/22- Tocluzimab X 1 4/22- started CVVHD 4/21-4/24 Proned daily 4/28 -4/30 proned daily , tapered NO to off  4/30 -late evening with increased O2 demands . No paralytics required x 24hr . No vent dysyncrony . Calm on Fent /Versed. Off precedex   5/1 - Remains on CRRT , no acute issues overnight --4.8 L I/O Balance since admit . Continues to Prone on/off 16 on and 8 off . -CXR stable diffuse bilateral infiltrates (slightly improved aeration),  Vent .80Fio2 .  5/3 convalescent plasma  Biomarkes improving but severe ARDS persists ad D-dimer still very high (13) without improvement. On IV Heparin gtt. Growing MSSA. SEdation changed to dilaudid gtt. Continues prone 16h and 8h syupine. Currently supine at 80% fio2, peep 18. On TF. STARTED NIMBEX  5/4-5/5-able to stay in the supine position, able to discontinue paralysis and maintain FiO2 0.50, PEEP 8-10.  5/5 hematochezia, bloody gastric aspirate  5/8 followed commands  5/11 extubated  5/12 re-intubated, tracheostomy  5/13 perm cath  5/14 worsening hypoxemia, shock suddenly, RV dilated, no new CXR changes, treated for PE empirically  5/21 doing t-piece trials May 23: Trial off of CVVHD May 25 remains off of  ventilator support since May 21, remains anuric, plan for CVVHD again today may 26 thru 6/1: now s/p two negative COVID-19 tests. Working on rehab efforts, determining HD needs and disposition.  Consults:  PCCM 4/21  Procedures:  4/21 - ETT 7.5 >> 5/12 5/12 Trach >> 4/21 - RIJ CVC [place] ED >>5/10 5/10 - CVC >> 5/13 4/22 L IJ HD Cath>5/15 5/13 R IJ Permcath>    Significant Diagnostic Tests:  POC U/S 4/21: Bilateral interstitial pattern with bilateral dense consolidation at bases. Echo shows low-normal LVSF. RV moderately dilated with septal shift.  5/14 Echo> LVEF > 65%, RV dilated, decreased RV function (moderate) with RVSP 60  Micro Data:  Blood cultures -PND SARS-CoV2 02/23/2019 - - POSITIVE HIV 4/21 -neg Quant gold 4/2 1- neg .....................Marland Kitchen resp 4/29 >>rare MSSA and yeast  5/10 blood > neg 5/18 c. Diff > neg  Anti-covid RX and antimicrobial RX  Azithromycin 4/21 >>4/25 Ceftriaxone 4/22 >>4/25 Actemra 4/22 Hydroxychloroquine 4/21 > 4/22 ........................... Ceftriaxone 5/2 >>5/10 Cefepime 5/10>>> 5/12 Zosyn 5/12 >>5/17 Vancomycin 5/10>>>5/17  Fluconazole 5/12 > 5/17   Interim history/subjective:  Remains off of mechanical ventilation Has not made urine since holding hemodialysis 2 days ago Otherwise no complaints  Objective   Blood pressure 130/90, pulse (Abnormal) 103, temperature 97.9 F (36.6 C), temperature source Oral, resp. rate 16, height 5\' 5"  (1.651 m), weight 60.8 kg, SpO2 93 %.    FiO2 (%):  [60 %] 60 %   Intake/Output Summary (Last 24 hours) at 04/05/2019 1435 Last data filed at 04/05/2019 1021 Gross per 24 hour  Intake 1240 ml  Output 350 ml  Net 890 ml   Filed Weights   04/03/19 1510 04/04/19 0224 04/05/19 0418  Weight: 63.2 kg 60.8 kg 60.8 kg    Examination:  General 48 year old non-english speaking male. Currently sitting up on [facility] HENT #4 cuffless trach. PMV in place pulm decreased bases Card RRR no MRG  abd not tender  gu limited UOP Neuro intact   Resolved Hospital Problem list     Assessment & Plan:  1) Suspected pulmonary embolism: Acute hypoxemia, RV dilation on echo 2) Acute respiratory failure with hypoxemia, recovering ARDS from COVID-19: COVID 19 testing now neg X 2 3) AKI (getting IHD TTS) 4) Anemia  5) dysphagia  6) tracheostomy dependence s/p prolonged critical illness and difficulty weaning from mechanical vent (downsized to #4 cuffless 5/26) 7) severe deconditioning s/p prolonged critical illness  Discussion Seems to be doing well from trach stand-point. He is on minimal oxygen but still desaturates. Should eventually try to work towards decannulation; still seems deconditioned. Will see him again this week on Wednesday. If no sig issues (not much for cough, effectively coughing, minimal desaturation events) may be worth an attempt at capping trial.   Plan Continue current supportive care measures Cont PT/OT/SLP f/u Will re-eval 6/3; I would like to assess him for capping trials soon. decannualtion would open a lot of dc options for him if this is safe to do.  Complete 3 mo systemic AC  Erick Colace ACNP-BC [place] Pager # [phone] OR # [phone] if no answer

## 2019-04-05 NOTE — Progress Notes (Signed)
Patient ID: Jonathan [name], male   DOB: 03-May-1971, 48 y.o.   MRN: [id number] S: No new issues in chart- has had 2 negative covid tests- making urine where he had not been doing before !  O:BP 123/76   Pulse 95   Temp 98.2 F (36.8 C) (Oral)   Resp 19   Ht 5\' 5"  (1.651 m)   Wt 60.8 kg   SpO2 97%   BMI 22.31 kg/m   Intake/Output Summary (Last 24 hours) at 04/05/2019 0937 Last data filed at 04/05/2019 0116 Gross per 24 hour  Intake 400 ml  Output 350 ml  Net 50 ml   Intake/Output: I/O last 3 completed shifts: In: 410 [P.O.:400; I.V.:10] Out: 350 [Urine:350]  Intake/Output this shift:  No intake/output data recorded. Weight change: -2.4 kg Direct physical contact was not performed out of concern for covid-19 infection.  Utilizing exam of the primary team and observations of RN's.   Recent Labs  Lab 03/29/19 1643 03/30/19 0220 04/01/19 0917 04/02/19 0707 04/03/19 0531 04/04/19 0559 04/05/19 0555  NA 135 136 135 134* 136 136 136  K 4.7 5.0 4.3 4.0 4.2 4.0 3.4*  CL 98 99 95* 95* 95* 97* 97*  CO2 27 29 29 26 24 26 24   GLUCOSE 222* 108* 136* 133* 113* 144* 138*  BUN 98* 69* 105* 82* 106* 66* 84*  CREATININE 5.43* 3.61* 6.51* 5.73* 6.53* 4.90* 5.41*  ALBUMIN 2.8* 2.8*  --   --   --   --  3.1*  CALCIUM 10.6* 10.2 12.5* 12.2* 13.1* 10.2 10.8*  PHOS 6.4* 4.8*  --   --   --   --  8.3*   Liver Function Tests: Recent Labs  Lab 03/29/19 1643 03/30/19 0220 04/05/19 0555  ALBUMIN 2.8* 2.8* 3.1*   No results for input(s): LIPASE, AMYLASE in the last 168 hours. No results for input(s): AMMONIA in the last 168 hours. CBC: Recent Labs  Lab 03/30/19 0220 04/02/19 0707 04/03/19 0531 04/04/19 0559 04/05/19 0555  WBC 8.4 8.5 8.0 8.7 10.1  HGB 9.2* 8.9* 8.9* 9.1* 9.2*  HCT 29.6* 28.0* 28.1* 28.4* 28.0*  MCV 96.1 96.9 94.9 96.3 96.6  PLT 299 295 323 282 301   Cardiac Enzymes: No results for input(s): CKTOTAL, CKMB, CKMBINDEX, TROPONINI in the last 168  hours. CBG: Recent Labs  Lab 04/04/19 1715 04/04/19 1915 04/04/19 2249 04/05/19 0415 04/05/19 0751  GLUCAP 157* 167* 129* 135* 121*    Iron Studies: No results for input(s): IRON, TIBC, TRANSFERRIN, FERRITIN in the last 72 hours. Studies/Results: Dg Abd 1 View  Result Date: 04/03/2019 CLINICAL DATA:  Hypoxia, nausea, vomiting EXAM: ABDOMEN - 1 VIEW COMPARISON:  04/02/2019 FINDINGS: Oral contrast material noted within the colon which is decompressed. Nonobstructive bowel gas pattern. No organomegaly or free air. IMPRESSION: No evidence of bowel obstruction or free air.  No acute findings. Electronically Signed   By: Rolm Baptise M.D.   On: 04/03/2019 21:20   Dg Chest Port 1 View  Result Date: 04/03/2019 CLINICAL DATA:  Hypoxia, nausea, vomiting EXAM: PORTABLE CHEST 1 VIEW COMPARISON:  03/25/2019 FINDINGS: Patchy bilateral airspace disease again noted, minimally improved since prior study. Low lung volumes. Right dialysis catheter remains in the right atrium. Tracheostomy tube is unchanged. Interval removal of NG tube. Heart is normal size. IMPRESSION: Low lung volumes. Patchy bilateral airspace disease, slightly improved. Electronically Signed   By: Rolm Baptise M.D.   On: 04/03/2019 21:20   . chlorhexidine  15 mL  Mouth Rinse BID  . Chlorhexidine Gluconate Cloth  6 each Topical Daily  . Chlorhexidine Gluconate Cloth  6 each Topical Q0600  . clonazepam  0.25 mg Oral Daily  . darbepoetin (ARANESP) injection - NON-DIALYSIS  100 mcg Subcutaneous Q Sat-1800  . enoxaparin (LOVENOX) injection  60 mg Subcutaneous Q24H  . famotidine  20 mg Oral Daily  . feeding supplement (NEPRO CARB STEADY)  1,000 mL Oral Q24H  . feeding supplement (PRO-STAT SUGAR FREE 64)  30 mL Oral TID  . free water  100 mL Oral Q8H  . guaiFENesin  5 mL Oral BID  . insulin aspart  0-15 Units Subcutaneous Q4H  . insulin aspart  6 Units Subcutaneous Q4H  . insulin glargine  10 Units Subcutaneous BID  . mouth rinse  15 mL  Mouth Rinse q12n4p  . mupirocin cream   Topical BID  . oxyCODONE  5 mg Oral QHS  . pantoprazole  40 mg Oral BID  . senna-docusate  1 tablet Oral BID  . sodium chloride flush  10-40 mL Intracatheter Q12H  . sodium zirconium cyclosilicate  10 g Oral Daily    BMET    Component Value Date/Time   NA 136 04/05/2019 0555   K 3.4 (L) 04/05/2019 0555   CL 97 (L) 04/05/2019 0555   CO2 24 04/05/2019 0555   GLUCOSE 138 (H) 04/05/2019 0555   BUN 84 (H) 04/05/2019 0555   CREATININE 5.41 (H) 04/05/2019 0555   CALCIUM 10.8 (H) 04/05/2019 0555   GFRNONAA 12 (L) 04/05/2019 0555   GFRAA 13 (L) 04/05/2019 0555   CBC    Component Value Date/Time   WBC 10.1 04/05/2019 0555   RBC 2.90 (L) 04/05/2019 0555   HGB 9.2 (L) 04/05/2019 0555   HCT 28.0 (L) 04/05/2019 0555   PLT 301 04/05/2019 0555   MCV 96.6 04/05/2019 0555   MCH 31.7 04/05/2019 0555   MCHC 32.9 04/05/2019 0555   RDW 15.0 04/05/2019 0555   LYMPHSABS 3.1 03/14/2019 1206   MONOABS 2.2 (H) 03/14/2019 1206   EOSABS 0.3 03/14/2019 1206   BASOSABS 0.2 (H) 03/14/2019 1206   Brief HPI: Pt is a 48 y.o. yo male who was admitted on 02/23/2019 with anuric AKI due to ATN in the setting of COVID   Assessment/Plan:  1. AKI in setting of covid-19 infection.  Crt 0.95 2 mos ago. Started on CVVHD on 02/23/19 and transitioned to IHD on 04/01/19- doing HD TTS via [facility].  starting to have an increase in UOP.  S/p HD Sat and kept even. So far rise of BUN and crt in between tx- suspect will need HD tomorrow but will look at labs and UOP first 2. covid-19 infection.  Improving- now with 2 negative tests 3. Acute hypoxic respiratory failure s/p trach and off of vent since 03/25/19 4. Anemia- due to acute illness. S/p transfusion on 03/28/19 and started on Aranesp 03/27/19- improving slowly  5. Hypercalcemia- using low Ca dialysate.  Possibly due to immobilization. Phos is up too , no binder yet  6. Dispo- difficult, no insurance.  Have not started CLIP process  as do not know dispo and hoping that he may recover renal function ??  [name]

## 2019-04-05 NOTE — Progress Notes (Signed)
Occupational Therapy Treatment Patient Details Name: Jonathan [name] MRN: [id number] DOB: Apr 04, 1971 Today's Date: 04/05/2019    History of present illness 48 y.o. male admitted on 02/23/19 with AMS, O2 sats in the ED were 50%.  Pt dx with COVID 19 and resultant ARDS.  He was intubated 4/21-5/11. Reintubated and trached 5/12. Off vent and on Tbar since 5/21.  His course is complicated by acute kidney injury requiring CVVHD.  Pt transferred back to [facility] on 5/26 and changed to intermittent HD.    OT comments  Pt continues to make steady progress with therapies. Pt able to progress with mobility today, completing further distance using RW with overall minA (+2 safety/equipment). Pt does fatigue easily and desats with activity requiring seated rest breaks throughout session. Pt performing seated grooming ADL with setup/minguard assist. Pt using O2 trach collar at 55%, lowest sat noted 83% with activity, rebounds to 90% and above within approx 1 min, max RR up to low 40s and max HR into low 120s. Feel he remains a great candidate for CIR level therapy services at time of discharge. Will continue to follow acutely.   Follow Up Recommendations  CIR    Equipment Recommendations  3 in 1 bedside commode          Precautions / Restrictions Precautions Precautions: Fall Precaution Comments: R IJ HD catheter, on /trach/T bar, NGT Restrictions Weight Bearing Restrictions: No       Mobility Bed Mobility Overal bed mobility: Needs Assistance Bed Mobility: Supine to Sit     Supine to sit: Min assist;HOB elevated     General bed mobility comments: Assist to elevate trunk into sitting, to ensure safety with lines  Transfers Overall transfer level: Needs assistance Equipment used: Rolling walker (2 wheeled);2 person hand held assist Transfers: Sit to/from Stand Sit to Stand: Min assist;+2 safety/equipment         General transfer comment: Verbal/visual cues for safe hand  placement; light assist to rise and steady at RW    Balance Overall balance assessment: Needs assistance Sitting-balance support: Feet unsupported;No upper extremity supported Sitting balance-Leahy Scale: Fair     Standing balance support: Bilateral upper extremity supported Standing balance-Leahy Scale: Poor Standing balance comment: walker and min assist for static standing                           ADL either performed or assessed with clinical judgement   ADL Overall ADL's : Needs assistance/impaired     Grooming: Wash/dry face;Set up;Min guard;Sitting                               Functional mobility during ADLs: Minimal assistance;+2 for physical assistance;+2 for safety/equipment General ADL Comments: close chair follow and pt with x1 LOB during mobility; pt requiring rest breaks throughout session as he fatigues/desats quickly      Vision       Perception     Praxis      Cognition Arousal/Alertness: Awake/alert Behavior During Therapy: Flat affect Overall Cognitive Status: Difficult to assess                                 General Comments: pt following commands consistently; appears to have increased awareness         Exercises     Shoulder Instructions  General Comments Pt on trach collar at 55%, lowest SpO2 83% with activity, rebounds within approx 1 min to 90% and above; max RR low 40s and max HR up to mid 120s    Pertinent Vitals/ Pain       Pain Assessment: No/denies pain  Home Living                                          Prior Functioning/Environment              Frequency  Min 3X/week        Progress Toward Goals  OT Goals(current goals can now be found in the care plan section)  Progress towards OT goals: Progressing toward goals  Acute Rehab OT Goals Patient Stated Goal: to walk into the hallway OT Goal Formulation: With patient Time For Goal Achievement:  04/19/19 Potential to Achieve Goals: Good  Plan Discharge plan remains appropriate    Co-evaluation    PT/OT/SLP Co-Evaluation/Treatment: Yes Reason for Co-Treatment: For patient/therapist safety;To address functional/ADL transfers;Complexity of the patient's impairments (multi-system involvement)   OT goals addressed during session: ADL's and self-care      AM-PAC OT "6 Clicks" Daily Activity     Outcome Measure   Help from another person eating meals?: A Lot Help from another person taking care of personal grooming?: A Lot Help from another person toileting, which includes using toliet, bedpan, or urinal?: A Lot Help from another person bathing (including washing, rinsing, drying)?: A Lot Help from another person to put on and taking off regular upper body clothing?: A Lot Help from another person to put on and taking off regular lower body clothing?: A Lot 6 Click Score: 12    End of Session Equipment Utilized During Treatment: Rolling walker;Oxygen  OT Visit Diagnosis: Other abnormalities of gait and mobility (R26.89);Muscle weakness (generalized) (M62.81)   Activity Tolerance Patient tolerated treatment well   Patient Left in chair;with call bell/phone within reach;with chair alarm set;with nursing/sitter in room(NT present)   Nurse Communication Mobility status        Time: 2563-8937 OT Time Calculation (min): 44 min  Charges: OT General Charges $OT Visit: 1 Visit OT Treatments $Self Care/Home Management : 8-22 mins  Lou Cal, OT Supplemental Rehabilitation Services Pager [phone] Office [phone]    Raymondo Band 04/05/2019, 2:40 PM

## 2019-04-05 NOTE — Progress Notes (Signed)
PROGRESS NOTE  Jonathan [name] UXL:[id number] DOB: 1971-03-15 DOA: 02/23/2019  PCP: Patient, No Pcp Per  Brief History/Interval Summary:  -48 year old Spanish-speaking male from [place], was admitted on 4/21 with increased respiratory distress, he was intubated on admission for ARDS due to SARS COVID-19 infection. -Unfortunately developed severe ATN required CRRT, prolonged respiratory failure , was extubated and reintubated earlier this month, subsequently underwent tracheostomy on 5/12 , followed by placement of a permacath on 5/13 -Ventrally liberated off the ventilator 5/21 however remained anuric requiring intermittent hemodialysis subsequently transferred to [facility] 5/26 from [facility]. -Was also started on anticoagulation while in ICU for suspected pulmonary embolism -5/29 passed swallow evaluation, his core track was removed and he was started on dysphagia diet with nectar thick liquids  Events: 02/23/2019 admitted to the ICU. 02/24/2019 started on CRRT 4/21-4/24 Proned daily 4/28 -4/30 proned daily  03/04/2019 increase in oxygen demand required paralytic for 24 hours. 03/09/2019 hematochezia from gastric aspiration 5/11 extubated 5/12 re-intubated, tracheostomy 5/13 perm cath 5/14 worsening hypoxemia, shock suddenly, RV dilated, no new CXR changes, treated for PE empirically 5/21 doing t-piece trials 5/23: Trial off of CVVHD 5/24: 2 units PRBC 5/25 remains off of ventilator support since May 21, remains anuric, 5/26  patient transferred to [facility] from [facility] as he needed intermittent hemodialysis  5/29 passed modified swallow eval 5/30 started on nectar thick liquis and taking pills by mouth  Lines and tubes: 4/21 - ETT >>5.11.2020 4/21 - RIJ CVC, removed 4/22 L IJ HD Cath> removed 5/13 Permacath right  Antibiotics: 02/23/2019-425 2020 azithromycin and Rocephin 02/23/2019-02/24/2019 03/06/2019 Rocephin 5/11-5/17 Vancomycin 5/12-5/18 Zosyn 5/12-5/17 Fluconazole for  candida in tracheal aspirate and recent Actemra  Significant diagnostic Tests; POC U/S 4/21: Bilateral interstitial pattern with bilateral dense consolidation at bases Echo shows low-normal LVSF. RV moderately dilated with septal shift.  5/14 Echo> LVEF > 65%, RV dilated, decreased RV function (moderate) with RVSP 60  Subjective/Interval History: -Over the weekend patient vomited x1 or 2 and hence his diet was downgraded, currently on thickened liquids only -Denies any vomiting overnight or this morning -Still having moderate amount of secretions which are blood-tinged, with small clots -Communicated by mouthing Spanish to the nurse who was at my bedside who helped translate and communicate - Assessment/Plan:  Acute Hypoxic Resp. Failure due to Acute Covid 19 Viral Illness/ARDS/septic shock  -Treated at [facility] with IV steroids, IV Actemra on 4/22, -Also received convalescent plasma on 5/3 -Status post tracheostomy 5/12 -Weaned off the ventilator 5/21 -Still has moderate O2 requirement with FiO2 of 60% -Chest x-ray 5/30 showed low lung volumes and patchy airspace disease which was improving -Complicated by ATN, anuria initially was on CRRT, now on intermittent hemodialysis, hoping for renal recovery -CCM following intermittently for trach care, trach was downsized to #4 uncuffed Shiley on 5/26  Presumed acute PE -On 5/14 patient had a sudden decline with shock, worsening hypoxemia and echocardiogram which showed right heart strain -CT angiogram could not be obtained due to severe AKI -He was started on IV heparin for presumed PE, Dopplers were negative -3 months of anticoagulation recommended -Continue Lovenox for now, renal dose Eliquis may be an option short-term  Acute kidney injury due to ATN/Covid -Baseline creatinine was normal 2 months ago, he started CRRT on 4/21 and transition to intermittent hemodialysis 5/28, was undergoing HD Tuesday Thursday Saturday via [facility], has had mild  improvement in urine output -Urine output of 250 only last 24 hours, per nephrology may need HD soon, creatinine  has trended up to 5.4 today  Anemia due to critical illness- -No evidance  of significant GI bleed, -Has had small to modest amount of bleeding from tracheostomy trauma/suctioning -Monitor -Received 2 units PRBC 03/28/2019 -Hemoglobin remains stable  Dysphagia -Insetting of subacute respiratory failure, tracheostomy -SLP following we will ask them to reassess, as he was downgraded to liquid diet following an episode of vomiting -I suspect his diet can be upgraded  Hyperglycemia -No clear history of diabetes, hemoglobin A1c was 5.5 -Remains on Lantus 10 units twice daily and sliding scale insulin, CBGs are less than 150  Pressure injury on the skin of nose.bilateral cheeks Local wound care. -Appears to be healing  N/V: KUB no acute findings He started to have n/v later on 5/30 , diet changed to liquid -Appears to have resolved, will monitor -Continue PPI  DVT Prophylaxis: was On heparin infusion , now on therapeutic lovenox since 5/30, expect transition to eliquis once able to take oral meds reliably    PUD Prophylaxis: On famotidine/ppi Code Status: Partial code Family Communication:  Wife Energy manager number [phone].  Dr.Fang d/w family regularly via interpreter (Interpreter number is [phone]. If you call her she will connect you to family and interpret for you) disposition Plan: needs trach downsizing, speech eval, PMV and diet advancement, Decision regarding need for Long Term HD Now a candidate for CIR LTAC vs snf placement , repeat COVID 19 test negative x2 on 5/28 and 5/30   Consultants: Pulmonology.  Nephrology.  Medications:  Scheduled: . chlorhexidine  15 mL Mouth Rinse BID  . [START ON 04/06/2019] Chlorhexidine Gluconate Cloth  6 each Topical Q0600  . clonazepam  0.25 mg Oral Daily  . darbepoetin (ARANESP) injection  - NON-DIALYSIS  100 mcg Subcutaneous Q Sat-1800  . enoxaparin (LOVENOX) injection  60 mg Subcutaneous Q24H  . famotidine  20 mg Oral Daily  . feeding supplement (NEPRO CARB STEADY)  1,000 mL Oral Q24H  . feeding supplement (PRO-STAT SUGAR FREE 64)  30 mL Oral TID  . free water  100 mL Oral Q8H  . guaiFENesin  5 mL Oral BID  . insulin aspart  0-15 Units Subcutaneous Q4H  . insulin aspart  6 Units Subcutaneous Q4H  . insulin glargine  10 Units Subcutaneous BID  . mouth rinse  15 mL Mouth Rinse q12n4p  . mupirocin cream   Topical BID  . oxyCODONE  5 mg Oral QHS  . pantoprazole  40 mg Oral BID  . senna-docusate  1 tablet Oral BID  . sodium chloride flush  10-40 mL Intracatheter Q12H  . sodium zirconium cyclosilicate  10 g Oral Daily   Continuous: . sodium chloride    . sodium chloride Stopped (03/21/19 2128)   RJJ:OACZY/SAYTKZSW arterial line **AND** sodium chloride, sodium chloride, acetaminophen, glycopyrrolate, heparin, lip balm, metoprolol tartrate, morphine injection, ondansetron (ZOFRAN) IV, oxyCODONE, Resource ThickenUp Clear, sodium chloride flush   Objective:  Vital Signs  Vitals:   04/05/19 0315 04/05/19 0418 04/05/19 0731 04/05/19 0908  BP:  131/82 117/84 123/76  Pulse: 97 93 89 95  Resp: (!) 21 (!) 22 17 19   Temp:  97.7 F (36.5 C) 98.2 F (36.8 C)   TempSrc:  Oral Oral   SpO2: 93% 95% 97% 97%  Weight:  60.8 kg    Height:        Intake/Output Summary (Last 24 hours) at 04/05/2019 1041 Last data filed at 04/05/2019 1021 Gross per 24 hour  Intake 1240 ml  Output 350  ml  Net 890 ml   Filed Weights   04/03/19 1510 04/04/19 0224 04/05/19 0418  Weight: 63.2 kg 60.8 kg 60.8 kg   Gen: Awake alert ill-appearing male, oriented to self place, communicated with the help of staff nurse HEENT: Trach collar, scant blood-tinged secretions noted, hyperpigmented  lesions on both cheeks more prominent on the left, appears to be healing Lungs: Conducted upper airway sounds  bilaterally CVS: S1-S2/regular rate and rhythm Abd: soft, Non tender, non distended, BS present Extremities: No edema Skin: no new rashes    Lab Results:  Data Reviewed: I have personally reviewed following labs and imaging studies  CBC: Recent Labs  Lab 03/30/19 0220 04/02/19 0707 04/03/19 0531 04/04/19 0559 04/05/19 0555  WBC 8.4 8.5 8.0 8.7 10.1  HGB 9.2* 8.9* 8.9* 9.1* 9.2*  HCT 29.6* 28.0* 28.1* 28.4* 28.0*  MCV 96.1 96.9 94.9 96.3 96.6  PLT 299 295 323 282 786    Basic Metabolic Panel: Recent Labs  Lab 03/29/19 1643 03/30/19 0220 04/01/19 0917 04/02/19 0707 04/03/19 0531 04/04/19 0559 04/05/19 0555  NA 135 136 135 134* 136 136 136  K 4.7 5.0 4.3 4.0 4.2 4.0 3.4*  CL 98 99 95* 95* 95* 97* 97*  CO2 27 29 29 26 24 26 24   GLUCOSE 222* 108* 136* 133* 113* 144* 138*  BUN 98* 69* 105* 82* 106* 66* 84*  CREATININE 5.43* 3.61* 6.51* 5.73* 6.53* 4.90* 5.41*  CALCIUM 10.6* 10.2 12.5* 12.2* 13.1* 10.2 10.8*  MG  --  3.1*  --   --   --   --   --   PHOS 6.4* 4.8*  --   --   --   --  8.3*    GFR: Estimated Creatinine Clearance: 14.4 mL/min (A) (by C-G formula based on SCr of 5.41 mg/dL (H)).  Liver Function Tests: Recent Labs  Lab 03/29/19 1643 03/30/19 0220 04/05/19 0555  ALBUMIN 2.8* 2.8* 3.1*    CBG: Recent Labs  Lab 04/04/19 1715 04/04/19 1915 04/04/19 2249 04/05/19 0415 04/05/19 0751  GLUCAP 157* 167* 129* 135* 121*     Recent Results (from the past 240 hour(s))  SARS Coronavirus 2 (CEPHEID- Performed in [place] hospital lab), Hosp Order     Status: None   Collection Time: 04/02/19 12:40 PM  Result Value Ref Range Status   SARS Coronavirus 2 NEGATIVE NEGATIVE Final    Comment: (NOTE) If result is NEGATIVE SARS-CoV-2 target nucleic acids are NOT DETECTED. The SARS-CoV-2 RNA is generally detectable in upper and lower  respiratory specimens during the acute phase of infection. The lowest  concentration of SARS-CoV-2 viral copies this  assay can detect is 250  copies / mL. A negative result does not preclude SARS-CoV-2 infection  and should not be used as the sole basis for treatment or other  patient management decisions.  A negative result may occur with  improper specimen collection / handling, submission of specimen other  than nasopharyngeal swab, presence of viral mutation(s) within the  areas targeted by this assay, and inadequate number of viral copies  (<250 copies / mL). A negative result must be combined with clinical  observations, patient history, and epidemiological information. If result is POSITIVE SARS-CoV-2 target nucleic acids are DETECTED. The SARS-CoV-2 RNA is generally detectable in upper and lower  respiratory specimens dur ing the acute phase of infection.  Positive  results are indicative of active infection with SARS-CoV-2.  Clinical  correlation with patient history and other diagnostic information  is  necessary to determine patient infection status.  Positive results do  not rule out bacterial infection or co-infection with other viruses. If result is PRESUMPTIVE POSTIVE SARS-CoV-2 nucleic acids MAY BE PRESENT.   A presumptive positive result was obtained on the submitted specimen  and confirmed on repeat testing.  While 2019 novel coronavirus  (SARS-CoV-2) nucleic acids may be present in the submitted sample  additional confirmatory testing may be necessary for epidemiological  and / or clinical management purposes  to differentiate between  SARS-CoV-2 and other Sarbecovirus currently known to infect humans.  If clinically indicated additional testing with an alternate test  methodology [phone]) is advised. The SARS-CoV-2 RNA is generally  detectable in upper and lower respiratory sp ecimens during the acute  phase of infection. The expected result is Negative. Fact Sheet for Patients:  StrictlyIdeas.no Fact Sheet for Healthcare  Providers: BankingDealers.co.za This test is not yet approved or cleared by the [place] FDA and has been authorized for detection and/or diagnosis of SARS-CoV-2 by FDA under an Emergency Use Authorization (EUA).  This EUA will remain in effect (meaning this test can be used) for the duration of the COVID-19 declaration under Section 564(b)(1) of the Act, 21 U.S.C. section 360bbb-3(b)(1), unless the authorization is terminated or revoked sooner. Performed at [facility], [place] [address], [place], [place] [address]   SARS Coronavirus 2 (CEPHEID- Performed in [place] hospital lab), Hosp Order     Status: None   Collection Time: 04/03/19 11:28 AM  Result Value Ref Range Status   SARS Coronavirus 2 NEGATIVE NEGATIVE Final    Comment: (NOTE) If result is NEGATIVE SARS-CoV-2 target nucleic acids are NOT DETECTED. The SARS-CoV-2 RNA is generally detectable in upper and lower  respiratory specimens during the acute phase of infection. The lowest  concentration of SARS-CoV-2 viral copies this assay can detect is 250  copies / mL. A negative result does not preclude SARS-CoV-2 infection  and should not be used as the sole basis for treatment or other  patient management decisions.  A negative result may occur with  improper specimen collection / handling, submission of specimen other  than nasopharyngeal swab, presence of viral mutation(s) within the  areas targeted by this assay, and inadequate number of viral copies  (<250 copies / mL). A negative result must be combined with clinical  observations, patient history, and epidemiological information. If result is POSITIVE SARS-CoV-2 target nucleic acids are DETECTED. The SARS-CoV-2 RNA is generally detectable in upper and lower  respiratory specimens dur ing the acute phase of infection.  Positive  results are indicative of active infection with SARS-CoV-2.  Clinical  correlation with patient history and  other diagnostic information is  necessary to determine patient infection status.  Positive results do  not rule out bacterial infection or co-infection with other viruses. If result is PRESUMPTIVE POSTIVE SARS-CoV-2 nucleic acids MAY BE PRESENT.   A presumptive positive result was obtained on the submitted specimen  and confirmed on repeat testing.  While 2019 novel coronavirus  (SARS-CoV-2) nucleic acids may be present in the submitted sample  additional confirmatory testing may be necessary for epidemiological  and / or clinical management purposes  to differentiate between  SARS-CoV-2 and other Sarbecovirus currently known to infect humans.  If clinically indicated additional testing with an alternate test  methodology [phone]) is advised. The SARS-CoV-2 RNA is generally  detectable in upper and lower respiratory sp ecimens during the acute  phase of infection.  The expected result is Negative. Fact Sheet for Patients:  StrictlyIdeas.no Fact Sheet for Healthcare Providers: BankingDealers.co.za This test is not yet approved or cleared by the [place] FDA and has been authorized for detection and/or diagnosis of SARS-CoV-2 by FDA under an Emergency Use Authorization (EUA).  This EUA will remain in effect (meaning this test can be used) for the duration of the COVID-19 declaration under Section 564(b)(1) of the Act, 21 U.S.C. section 360bbb-3(b)(1), unless the authorization is terminated or revoked sooner. Performed at [facility], [place] [address], [place], [place] [address]       Radiology Studies: Dg Abd 1 View  Result Date: 04/03/2019 CLINICAL DATA:  Hypoxia, nausea, vomiting EXAM: ABDOMEN - 1 VIEW COMPARISON:  04/02/2019 FINDINGS: Oral contrast material noted within the colon which is decompressed. Nonobstructive bowel gas pattern. No organomegaly or free air. IMPRESSION: No evidence of bowel obstruction or free air.   No acute findings. Electronically Signed   By: Rolm Baptise M.D.   On: 04/03/2019 21:20   Dg Chest Port 1 View  Result Date: 04/03/2019 CLINICAL DATA:  Hypoxia, nausea, vomiting EXAM: PORTABLE CHEST 1 VIEW COMPARISON:  03/25/2019 FINDINGS: Patchy bilateral airspace disease again noted, minimally improved since prior study. Low lung volumes. Right dialysis catheter remains in the right atrium. Tracheostomy tube is unchanged. Interval removal of NG tube. Heart is normal size. IMPRESSION: Low lung volumes. Patchy bilateral airspace disease, slightly improved. Electronically Signed   By: Rolm Baptise M.D.   On: 04/03/2019 21:20       LOS: 41 days   Domenic Polite MD  [facility] Pager on [email or web address]  04/05/2019, 10:41 AM

## 2019-04-05 NOTE — Progress Notes (Addendum)
Inpatient Rehabilitation Admissions Coordinator  Noted pt has 2 negative COVID test over the weekend. I contacted Terri Piedra [phone]) with outpt dialysis SW for placement as well as [name], employer. For pt to return to [place], he would have to be completely independent as other workers not  available 24/7 to assist. His work [employer] is until November. I have left a voicemail for [place], union Rep with [employer] to contact me to discuss resources locally or in [place] to assist with arranging dispo. [phone]. I await follow up with Union Rep before proceeding discussions with pt and his wife as dispo options.  Danne Baxter, RN, MSN Rehab Admissions Coordinator [phone] 04/05/2019 3:24 PM   Donald Prose, [employer] rep, states Union can provide housing in [place], and try to assist with immigration for daughter to come to assist him. I will contact wife and daughter with interpreter assistance tomorrow to clarify family preference for final disposition.

## 2019-04-05 NOTE — Progress Notes (Addendum)
  Speech Language Pathology Treatment: Dysphagia;Passy Muir Speaking valve  Patient Details Name: Jonathan [name] MRN: [id number] DOB: 09/06/1971 Today's Date: 04/05/2019 Time: 1583-0940 SLP Time Calculation (min) (ACUTE ONLY): 40 min  Assessment / Plan / Recommendation Clinical Impression  Video translator utilized during session.  Pt provided with replacement PMV.  He wore valve for thirty minutes with excellent toleration in terms of vital signs, however, voice remains impaired with predominantly aphonic output and occasional successful phonation (when pt attempts to raise volume).  He compensates by over-articulating, which the translator could interpret 90% of the time.  Pt's diet was changed to clears (thickened to nectar) over the w/e due to episode of vomiting.  He has had no further N/V, reports feeling better.  He consumed puree, nectar liquids with tolerance consistent with those noted on MBS.  There is occasional dry coughing throughout PO intake.  Recommend resuming dysphagia 1, nectar thick liquids for now.  SLP will continue to follow for safety, diet progression, PMV usage. Signs posted over bed.   HPI HPI: Pt is a 48 y.o. male admitted on 02/23/19 with AMS, O2 sats in the ED were 50%.  Pt dx with COVID 19 and resultant ARDS.  He was intubated 4/21-5/11; reintubated and then trached on 5/12. His course was complicated by acute kidney injury requiring CVVHD.  PMH includes DM2, HTN.. Pt transferred from [place] to [facility] on 5/26; now requiring intermittent HD.      SLP Plan  Continue with current plan of care       Recommendations  Diet recommendations: Dysphagia 1 (puree);Nectar-thick liquid Liquids provided via: Cup Medication Administration: Crushed with puree Supervision: Patient able to self feed;Full supervision/cueing for compensatory strategies Compensations: Slow rate;Small sips/bites;Multiple dry swallows after each bite/sip      Patient may use Passy-Muir Speech Valve:  (with full supervision) PMSV Supervision: Full         Oral Care Recommendations: Oral care BID Follow up Recommendations: Inpatient Rehab SLP Visit Diagnosis: Dysphagia, oropharyngeal phase (R13.12) Plan: Continue with current plan of care       GO                Juan Quam Laurice 04/05/2019, 5:05 PM  Dickie Cloe L. Tivis Ringer, [place] Office number [phone] Pager [phone]

## 2019-04-06 LAB — RENAL FUNCTION PANEL
Albumin: 2.9 g/dL — ABNORMAL LOW (ref 3.5–5.0)
Anion gap: 15 (ref 5–15)
BUN: 95 mg/dL — ABNORMAL HIGH (ref 6–20)
CO2: 26 mmol/L (ref 22–32)
Calcium: 10.9 mg/dL — ABNORMAL HIGH (ref 8.9–10.3)
Chloride: 96 mmol/L — ABNORMAL LOW (ref 98–111)
Creatinine, Ser: 5.17 mg/dL — ABNORMAL HIGH (ref 0.61–1.24)
GFR calc Af Amer: 14 mL/min — ABNORMAL LOW (ref >60)
GFR calc non Af Amer: 12 mL/min — ABNORMAL LOW (ref >60)
Glucose, Bld: 103 mg/dL — ABNORMAL HIGH (ref 70–99)
Phosphorus: 7.6 mg/dL — ABNORMAL HIGH (ref 2.5–4.6)
Potassium: 3.3 mmol/L — ABNORMAL LOW (ref 3.5–5.1)
Sodium: 137 mmol/L (ref 135–145)

## 2019-04-06 LAB — GLUCOSE, CAPILLARY
Glucose-Capillary: 116 mg/dL — ABNORMAL HIGH (ref 70–99)
Glucose-Capillary: 120 mg/dL — ABNORMAL HIGH (ref 70–99)
Glucose-Capillary: 147 mg/dL — ABNORMAL HIGH (ref 70–99)
Glucose-Capillary: 163 mg/dL — ABNORMAL HIGH (ref 70–99)
Glucose-Capillary: 184 mg/dL — ABNORMAL HIGH (ref 70–99)
Glucose-Capillary: 200 mg/dL — ABNORMAL HIGH (ref 70–99)
Glucose-Capillary: 201 mg/dL — ABNORMAL HIGH (ref 70–99)

## 2019-04-06 LAB — CBC
HCT: 26.3 % — ABNORMAL LOW (ref 39.0–52.0)
Hemoglobin: 8.6 g/dL — ABNORMAL LOW (ref 13.0–17.0)
MCH: 31.3 pg (ref 26.0–34.0)
MCHC: 32.7 g/dL (ref 30.0–36.0)
MCV: 95.6 fL (ref 80.0–100.0)
Platelets: 321 10*3/uL (ref 150–400)
RBC: 2.75 MIL/uL — ABNORMAL LOW (ref 4.22–5.81)
RDW: 14.8 % (ref 11.5–15.5)
WBC: 10.9 10*3/uL — ABNORMAL HIGH (ref 4.0–10.5)
nRBC: 0 % (ref 0.0–0.2)

## 2019-04-06 LAB — HEPARIN LEVEL (UNFRACTIONATED): Heparin Unfractionated: 0.33 IU/mL (ref 0.30–0.70)

## 2019-04-06 MED ORDER — NEPRO/CARBSTEADY PO LIQD
237.0000 mL | Freq: Two times a day (BID) | ORAL | Status: DC
  Administered 2019-04-08 – 2019-04-13 (×9): 237 mL via ORAL
  Filled 2019-04-06: qty 237

## 2019-04-06 MED ORDER — FUROSEMIDE 40 MG PO TABS
40.0000 mg | ORAL_TABLET | Freq: Two times a day (BID) | ORAL | Status: DC
  Administered 2019-04-06 – 2019-04-07 (×4): 40 mg via ORAL
  Filled 2019-04-06 (×4): qty 1

## 2019-04-06 MED ORDER — POTASSIUM CHLORIDE 20 MEQ/15ML (10%) PO SOLN
40.0000 meq | Freq: Once | ORAL | Status: AC
  Administered 2019-04-06: 40 meq via ORAL
  Filled 2019-04-06: qty 30

## 2019-04-06 NOTE — Progress Notes (Signed)
Assisted tele visit to patient with family member.  Plummer Matich Anderson, RN   

## 2019-04-06 NOTE — Progress Notes (Signed)
  Speech Language Pathology Treatment: Dysphagia;Passy Muir Speaking valve  Patient Details Name: Jonathan [name] MRN: [id number] DOB: 09-24-1971 Today's Date: 04/06/2019 Time: 6606-0045 SLP Time Calculation (min) (ACUTE ONLY): 22 min  Assessment / Plan / Recommendation Clinical Impression  Pt was seen with use of stratus interpreter [phone]). He was sitting upright in his bed and smiling, said he remembers working with this SLP. Mod cues were given with PMV in place to increase amount of vocalization during speaking, with greatest volume noted at the word level when he would take a deep breath between each word. He wore the valve for over 15 min with no overt difficulties. Intermittent coughing was noted at baseline (prior to even PMV placement) and was noted occasionally during PO intake. He consumed four ounces of puree and four ounces of applesauce with Min cues for hard swallows. Would continue current diet , using PMV whenever supervision can be provided by staff. Will continue to follow to progress pt and determine readiness for repeat MBS - perhaps as vocal quality continues to improve.    HPI HPI: Pt is a 48 y.o. male admitted on 02/23/19 with AMS, O2 sats in the ED were 50%.  Pt dx with COVID 19 and resultant ARDS.  He was intubated 4/21-5/11; reintubated and then trached on 5/12. His course was complicated by acute kidney injury requiring CVVHD.  PMH includes DM2, HTN.. Pt transferred from [place] to [facility] on 5/26; now requiring intermittent HD.      SLP Plan  Continue with current plan of care       Recommendations  Diet recommendations: Dysphagia 1 (puree);Nectar-thick liquid Liquids provided via: Cup;Straw Medication Administration: Crushed with puree Supervision: Patient able to self feed;Full supervision/cueing for compensatory strategies Compensations: Slow rate;Small sips/bites;Multiple dry swallows after each bite/sip Postural Changes and/or Swallow Maneuvers:  Seated upright 90 degrees                Oral Care Recommendations: Oral care BID Follow up Recommendations: Inpatient Rehab SLP Visit Diagnosis: Dysphagia, oropharyngeal phase (R13.12);Aphonia (R49.1) Plan: Continue with current plan of care       [place] Sereniti Wan 04/06/2019, 4:41 PM  Pollyann Glen, M.A. [place] Acute Environmental education officer [phone] Office [phone]

## 2019-04-06 NOTE — Progress Notes (Addendum)
PROGRESS NOTE  Jonathan [name] ERD:[id number] DOB: May 26, 1971 DOA: 02/23/2019  PCP: Patient, No Pcp Per  Brief History/Interval Summary:  -48 year old Spanish-speaking male from [place], was admitted on 4/21 with increased respiratory distress, he was intubated on admission for ARDS due to SARS COVID-19 infection. -Unfortunately developed severe ATN required CRRT, prolonged respiratory failure , was extubated and reintubated earlier this month, subsequently underwent tracheostomy on 5/12 , followed by placement of a permacath on 5/13 -Ventrally liberated off the ventilator 5/21 however remained anuric requiring intermittent hemodialysis subsequently transferred to [facility] 5/26 from [facility]. -Was also started on anticoagulation while in ICU for suspected pulmonary embolism -5/29 passed swallow evaluation, his core track was removed and he was started on dysphagia diet with nectar thick liquids, last HD 5/30, hopeful for renal recovery -Repeat COVID tested negative x2 on 5/29 and 5/30  Events: 02/23/2019 admitted to the ICU. 02/24/2019 started on CRRT 4/21-4/24 Proned daily 4/28 -4/30 proned daily  03/04/2019 increase in oxygen demand required paralytic for 24 hours. 03/09/2019 hematochezia from gastric aspiration 5/11 extubated 5/12 re-intubated, tracheostomy 5/13 perm cath 5/14 worsening hypoxemia, shock suddenly, RV dilated, no new CXR changes, treated for PE empirically 5/21 doing t-piece trials 5/23: Trial off of CVVHD 5/24: 2 units PRBC 5/25 remains off of ventilator support since May 21, remains anuric, 5/26  patient transferred to [facility] from [facility] as he needed intermittent hemodialysis  5/29 passed modified swallow eval 5/30 started on nectar thick liquis and taking pills by mouth,  dialysis  Lines and tubes: 4/21 - ETT >>5.11.2020 4/21 - RIJ CVC, removed 4/22 L IJ HD Cath> removed 5/13 Permacath right  Antibiotics: 02/23/2019-425 2020 azithromycin and  Rocephin 02/23/2019-02/24/2019 03/06/2019 Rocephin 5/11-5/17 Vancomycin 5/12-5/18 Zosyn 5/12-5/17 Fluconazole for candida in tracheal aspirate and recent Actemra  Significant diagnostic Tests; POC U/S 4/21: Bilateral interstitial pattern with bilateral dense consolidation at bases Echo shows low-normal LVSF. RV moderately dilated with septal shift.  5/14 Echo> LVEF > 65%, RV dilated, decreased RV function (moderate) with RVSP 60  Subjective/Interval History: -Communicated by mouthing words to bedside nurse who assisted with Spanish translation -No events overnight, spit up some phlegm this morning -Tolerating diet, O2 weaned down to 40%(8 liters high flow)  Assessment/Plan:  Acute Hypoxic Resp. Failure due to Acute Covid 19 Viral Illness/ARDS/septic shock  -Treated at [facility] with IV steroids, IV Actemra on 4/22, -Also received convalescent plasma on 5/3 -Status post tracheostomy 5/12 -Weaned off the ventilator 5/21 -Still has moderate O2 requirement with FiO2 of 60% -Chest x-ray 5/30 showed low lung volumes and patchy airspace disease which was improving -Complicated by ATN, anuria initially was on CRRT, now on intermittent hemodialysis, hoping for renal recovery -CCM following intermittently for trach care, trach was downsized to #4 uncuffed [place] on 5/26 -Clinically improving from a respiratory standpoint, O2 weaned down to 8 L high flow this morning still with some desaturation, per pulmonary if no significant issues they will consider capping trial this week with a goal towards early decannulation -CIR following as well  Presumed acute PE -On 5/14 patient had a sudden decline with shock, worsening hypoxemia and echocardiogram which showed right heart strain -CT angiogram could not be obtained due to severe AKI -He was started on IV heparin for presumed PE, Dopplers were negative -3 months of anticoagulation recommended -Continue Lovenox for now, renal dose Eliquis may be an option  short-term  Acute kidney injury due to ATN/Covid -Baseline creatinine was normal 2 months ago, he started CRRT on  4/21 and transition to intermittent hemodialysis 5/28, was undergoing HD Tuesday Thursday Saturday via [facility], has had mild improvement in urine output -Last hemodialysis was 5/30, mild improvement in urine output noted, 500 cc last 24 hours, labs stable -Nephrology following hopefully continues to have renal recovery  Anemia due to critical illness- -No evidance  of significant GI bleed, -Has had small to modest amount of bleeding from tracheostomy trauma/suctioning -Monitor -Received 2 units PRBC 03/28/2019 -Hemoglobin remains stable  Dysphagia -Insetting of subacute respiratory failure, tracheostomy -SLP following we will ask them to reassess, as he was downgraded to liquid diet following an episode of vomiting -I suspect his diet can be upgraded  Hyperglycemia -No clear history of diabetes, hemoglobin A1c was 5.5 -Remains on Lantus 10 units twice daily and sliding scale insulin, CBGs are less than 150  Pressure injury on the skin of nose.bilateral cheeks Local wound care. -Appears to be healing  DVT Prophylaxis: Lovenox  PUD Prophylaxis: On famotidine/ppi Code Status: Partial code Family Communication:  Wife Energy manager number [phone].  Dr.Fang d/w family regularly via interpreter (Interpreter number is [phone]. If you call her she will connect you to family and interpret for you) disposition Plan: needs trach capping trials, possible decannulation speech eval, PMV and diet advancement, depends on ongoing renal recovery Now a candidate for CIR CIR may be an option, repeat COVID 19 test negative x2 on 5/28 and 5/30   Consultants: Pulmonology.  Nephrology.  Medications:  Scheduled: . chlorhexidine  15 mL Mouth Rinse BID  . Chlorhexidine Gluconate Cloth  6 each Topical Q0600  . Chlorhexidine Gluconate Cloth  6 each Topical Q0600   . clonazepam  0.25 mg Oral Daily  . darbepoetin (ARANESP) injection - NON-DIALYSIS  100 mcg Subcutaneous Q Sat-1800  . enoxaparin (LOVENOX) injection  60 mg Subcutaneous Q24H  . famotidine  20 mg Oral Daily  . feeding supplement (NEPRO CARB STEADY)  237 mL Oral BID BM  . feeding supplement (PRO-STAT SUGAR FREE 64)  30 mL Oral TID  . free water  100 mL Oral Q8H  . furosemide  40 mg Oral BID  . guaiFENesin  5 mL Oral BID  . insulin aspart  0-15 Units Subcutaneous Q4H  . insulin aspart  6 Units Subcutaneous Q4H  . insulin glargine  10 Units Subcutaneous BID  . mouth rinse  15 mL Mouth Rinse q12n4p  . mupirocin cream   Topical BID  . oxyCODONE  5 mg Oral QHS  . pantoprazole  40 mg Oral BID  . senna-docusate  1 tablet Oral BID  . sodium chloride flush  10-40 mL Intracatheter Q12H   Continuous: . sodium chloride    . sodium chloride Stopped (03/21/19 2128)   OMV:EHMCN/OBSJGGEZ arterial line **AND** sodium chloride, sodium chloride, acetaminophen, glycopyrrolate, heparin, lip balm, metoprolol tartrate, morphine injection, ondansetron (ZOFRAN) IV, oxyCODONE, Resource ThickenUp Clear, sodium chloride flush   Objective:  Vital Signs  Vitals:   04/06/19 0753 04/06/19 0921 04/06/19 1139 04/06/19 1157  BP: 125/80 138/76 127/84 127/84  Pulse: 100 (!) 104 90 92  Resp: (!) 29 19 (!) 25 20  Temp: 98.2 F (36.8 C)  99.2 F (37.3 C)   TempSrc: Axillary  Axillary   SpO2: 93% 96% 99% 95%  Weight:      Height:        Intake/Output Summary (Last 24 hours) at 04/06/2019 1329 Last data filed at 04/06/2019 1015 Gross per 24 hour  Intake 140 ml  Output 501 ml  Net -361 ml   Filed Weights   04/04/19 0224 04/05/19 0418 04/06/19 0411  Weight: 60.8 kg 60.8 kg 62.4 kg   Gen: Awake, Alert, pale, oriented to place, communicated with the help of staff nurse HEENT: Tracheostomy, trach collar with scant secretions, both cheeks with small ulcers appear to be healing Lungs: Few scattered rhonchi,  conducted upper airway sounds, decreased at both bases CVS: S1-S2/regular rate rhythm abd: soft, Non tender, non distended, BS present Extremities: No edema Skin: no new rashes  Lab Results:  Data Reviewed: I have personally reviewed following labs and imaging studies  CBC: Recent Labs  Lab 04/02/19 0707 04/03/19 0531 04/04/19 0559 04/05/19 0555 04/06/19 0435  WBC 8.5 8.0 8.7 10.1 10.9*  HGB 8.9* 8.9* 9.1* 9.2* 8.6*  HCT 28.0* 28.1* 28.4* 28.0* 26.3*  MCV 96.9 94.9 96.3 96.6 95.6  PLT 295 323 282 301 834    Basic Metabolic Panel: Recent Labs  Lab 04/02/19 0707 04/03/19 0531 04/04/19 0559 04/05/19 0555 04/06/19 0435  NA 134* 136 136 136 137  K 4.0 4.2 4.0 3.4* 3.3*  CL 95* 95* 97* 97* 96*  CO2 26 24 26 24 26   GLUCOSE 133* 113* 144* 138* 103*  BUN 82* 106* 66* 84* 95*  CREATININE 5.73* 6.53* 4.90* 5.41* 5.17*  CALCIUM 12.2* 13.1* 10.2 10.8* 10.9*  PHOS  --   --   --  8.3* 7.6*    GFR: Estimated Creatinine Clearance: 15.2 mL/min (A) (by C-G formula based on SCr of 5.17 mg/dL (H)).  Liver Function Tests: Recent Labs  Lab 04/05/19 0555 04/06/19 0435  ALBUMIN 3.1* 2.9*    CBG: Recent Labs  Lab 04/05/19 1926 04/06/19 0007 04/06/19 0445 04/06/19 0751 04/06/19 1138  GLUCAP 214* 184* 120* 116* 200*     Recent Results (from the past 240 hour(s))  SARS Coronavirus 2 (CEPHEID- Performed in [place] hospital lab), Hosp Order     Status: None   Collection Time: 04/02/19 12:40 PM  Result Value Ref Range Status   SARS Coronavirus 2 NEGATIVE NEGATIVE Final    Comment: (NOTE) If result is NEGATIVE SARS-CoV-2 target nucleic acids are NOT DETECTED. The SARS-CoV-2 RNA is generally detectable in upper and lower  respiratory specimens during the acute phase of infection. The lowest  concentration of SARS-CoV-2 viral copies this assay can detect is 250  copies / mL. A negative result does not preclude SARS-CoV-2 infection  and should not be used as the sole  basis for treatment or other  patient management decisions.  A negative result may occur with  improper specimen collection / handling, submission of specimen other  than nasopharyngeal swab, presence of viral mutation(s) within the  areas targeted by this assay, and inadequate number of viral copies  (<250 copies / mL). A negative result must be combined with clinical  observations, patient history, and epidemiological information. If result is POSITIVE SARS-CoV-2 target nucleic acids are DETECTED. The SARS-CoV-2 RNA is generally detectable in upper and lower  respiratory specimens dur ing the acute phase of infection.  Positive  results are indicative of active infection with SARS-CoV-2.  Clinical  correlation with patient history and other diagnostic information is  necessary to determine patient infection status.  Positive results do  not rule out bacterial infection or co-infection with other viruses. If result is PRESUMPTIVE POSTIVE SARS-CoV-2 nucleic acids MAY BE PRESENT.   A presumptive positive result was obtained on the submitted specimen  and confirmed on repeat testing.  While 2019  novel coronavirus  (SARS-CoV-2) nucleic acids may be present in the submitted sample  additional confirmatory testing may be necessary for epidemiological  and / or clinical management purposes  to differentiate between  SARS-CoV-2 and other Sarbecovirus currently known to infect humans.  If clinically indicated additional testing with an alternate test  methodology [phone]) is advised. The SARS-CoV-2 RNA is generally  detectable in upper and lower respiratory sp ecimens during the acute  phase of infection. The expected result is Negative. Fact Sheet for Patients:  StrictlyIdeas.no Fact Sheet for Healthcare Providers: BankingDealers.co.za This test is not yet approved or cleared by the [place] FDA and has been authorized for detection  and/or diagnosis of SARS-CoV-2 by FDA under an Emergency Use Authorization (EUA).  This EUA will remain in effect (meaning this test can be used) for the duration of the COVID-19 declaration under Section 564(b)(1) of the Act, 21 U.S.C. section 360bbb-3(b)(1), unless the authorization is terminated or revoked sooner. Performed at [facility], [place] [address] [place], [place] [address]   SARS Coronavirus 2 (CEPHEID- Performed in [place] hospital lab), Hosp Order     Status: None   Collection Time: 04/03/19 11:28 AM  Result Value Ref Range Status   SARS Coronavirus 2 NEGATIVE NEGATIVE Final    Comment: (NOTE) If result is NEGATIVE SARS-CoV-2 target nucleic acids are NOT DETECTED. The SARS-CoV-2 RNA is generally detectable in upper and lower  respiratory specimens during the acute phase of infection. The lowest  concentration of SARS-CoV-2 viral copies this assay can detect is 250  copies / mL. A negative result does not preclude SARS-CoV-2 infection  and should not be used as the sole basis for treatment or other  patient management decisions.  A negative result may occur with  improper specimen collection / handling, submission of specimen other  than nasopharyngeal swab, presence of viral mutation(s) within the  areas targeted by this assay, and inadequate number of viral copies  (<250 copies / mL). A negative result must be combined with clinical  observations, patient history, and epidemiological information. If result is POSITIVE SARS-CoV-2 target nucleic acids are DETECTED. The SARS-CoV-2 RNA is generally detectable in upper and lower  respiratory specimens dur ing the acute phase of infection.  Positive  results are indicative of active infection with SARS-CoV-2.  Clinical  correlation with patient history and other diagnostic information is  necessary to determine patient infection status.  Positive results do  not rule out bacterial infection or co-infection with  other viruses. If result is PRESUMPTIVE POSTIVE SARS-CoV-2 nucleic acids MAY BE PRESENT.   A presumptive positive result was obtained on the submitted specimen  and confirmed on repeat testing.  While 2019 novel coronavirus  (SARS-CoV-2) nucleic acids may be present in the submitted sample  additional confirmatory testing may be necessary for epidemiological  and / or clinical management purposes  to differentiate between  SARS-CoV-2 and other Sarbecovirus currently known to infect humans.  If clinically indicated additional testing with an alternate test  methodology [phone]) is advised. The SARS-CoV-2 RNA is generally  detectable in upper and lower respiratory sp ecimens during the acute  phase of infection. The expected result is Negative. Fact Sheet for Patients:  StrictlyIdeas.no Fact Sheet for Healthcare Providers: BankingDealers.co.za This test is not yet approved or cleared by the [place] FDA and has been authorized for detection and/or diagnosis of SARS-CoV-2 by FDA under an Emergency Use Authorization (EUA).  This EUA will remain in effect (  meaning this test can be used) for the duration of the COVID-19 declaration under Section 564(b)(1) of the Act, 21 U.S.C. section 360bbb-3(b)(1), unless the authorization is terminated or revoked sooner. Performed at [facility], [place] [address] [place], [place] [address]       Radiology Studies: No results found.     LOS: 42 days   Domenic Polite MD    04/06/2019, 1:29 PM

## 2019-04-06 NOTE — Progress Notes (Addendum)
Occupational Therapy Treatment Patient Details Name: Jonathan [name] MRN: [id number] DOB: 02/02/1971 Today's Date: 04/06/2019    History of present illness 48 y.o. male admitted on 02/23/19 with AMS, O2 sats in the ED were 50%.  Pt dx with COVID 19 and resultant ARDS.  He was intubated 4/21-5/11. Reintubated and trached 5/12. Off vent and on Tbar since 5/21.  His course is complicated by acute kidney injury requiring CVVHD.  Pt transferred back to [facility] on 5/26 and changed to intermittent HD.    OT comments  Pt presents supine in bed agreeable to treatment session. Pt performing functional transfers using RW with minA (+2 lines/safety). Pt engaged in seated grooming ADL in recliner, assisted with trimming facial hair as pt requesting to shave today. Pt participating in additional bil UE ROM/exercises; pt with limited R shoulder ROM and with reports of R shoulder pain today, provided gentle stretching and including scapular mobility during seated exercise/activity. VSS throughout session. [employer] interpreter used Jonathan [name], Jonathan [name]. Feel POC remains appropriate at this time. Acute OT goals have been updated to reflect pt progress. Will continue to follow acutely.   Follow Up Recommendations  CIR    Equipment Recommendations  3 in 1 bedside commode          Precautions / Restrictions Precautions Precautions: Fall Precaution Comments: R IJ HD catheter, trach Restrictions Weight Bearing Restrictions: No       Mobility Bed Mobility Overal bed mobility: Needs Assistance Bed Mobility: Supine to Sit     Supine to sit: Min assist;HOB elevated     General bed mobility comments: Assist to elevate trunk into sitting, to ensure safety with lines  Transfers Overall transfer level: Needs assistance Equipment used: Rolling walker (2 wheeled);2 person hand held assist Transfers: Sit to/from Stand Sit to Stand: Min assist;+2 safety/equipment         General transfer comment:  Verbal/visual cues for safe hand placement; light assist to rise and steady at RW    Balance     Sitting balance-Leahy Scale: Fair       Standing balance-Leahy Scale: Poor Standing balance comment: walker and min assist for static standing                           ADL either performed or assessed with clinical judgement   ADL Overall ADL's : Needs assistance/impaired Eating/Feeding: Set up;Supervision/ safety Eating/Feeding Details (indicate cue type and reason): provided supervision for pt to spoon/drink tea (tea at appropriate thickness for current diet)  Grooming: Wash/dry face;Minimal assistance;Sitting Grooming Details (indicate cue type and reason): setup to wash face; pt requesting to shave but with longer beard at this time, assisted to trim mustache and beard so that hopefully pt can shave sometime soon                              Functional mobility during ADLs: Minimal assistance;+2 for safety/equipment;Rolling walker       Vision       Perception     Praxis      Cognition Arousal/Alertness: Awake/alert Behavior During Therapy: WFL for tasks assessed/performed Overall Cognitive Status: Difficult to assess                                 General Comments: pt following commands consistently; appears to have increased  awareness; making therapist, RN aware of certain items/needs during session (i.e. ensuring catheter in proper position, ensuring he has his call bell end of session)        Exercises Exercises: General Upper Extremity;Other exercises General Exercises - Upper Extremity Shoulder Flexion: AROM;AAROM;Both;10 reps(RUE grossly to 90*) Elbow Flexion: AROM;Both;10 reps Elbow Extension: AROM;Both;10 reps Other Exercises Other Exercises: lap slides R UE, x15 Other Exercises: shoulder roll forward, backward, x10 each direction bil UE, manual facilitation provided at R scapula during completion to ensure  gliding Other Exercises: PNF diagonals LUE, x10   Shoulder Instructions       General Comments overall VSS, pt requiring less O2 today (10L, 40% trach collar)    Pertinent Vitals/ Pain       Pain Assessment: Faces Faces Pain Scale: Hurts little more Pain Location: R shoulder with ROM Pain Descriptors / Indicators: Discomfort;Sore Pain Intervention(s): Monitored during session;Repositioned  Home Living                                          Prior Functioning/Environment              Frequency  Min 3X/week        Progress Toward Goals  OT Goals(current goals can now be found in the care plan section)  Progress towards OT goals: Progressing toward goals  Acute Rehab OT Goals Patient Stated Goal: less shoulder pain, to shave OT Goal Formulation: With patient Time For Goal Achievement: 04/19/19 Potential to Achieve Goals: Good  Plan Discharge plan remains appropriate    Co-evaluation                 AM-PAC OT "6 Clicks" Daily Activity     Outcome Measure   Help from another person eating meals?: A Lot Help from another person taking care of personal grooming?: A Lot Help from another person toileting, which includes using toliet, bedpan, or urinal?: A Lot Help from another person bathing (including washing, rinsing, drying)?: A Lot Help from another person to put on and taking off regular upper body clothing?: A Lot Help from another person to put on and taking off regular lower body clothing?: A Lot 6 Click Score: 12    End of Session Equipment Utilized During Treatment: Rolling walker;Oxygen(10L, trach collar 40%)  OT Visit Diagnosis: Other abnormalities of gait and mobility (R26.89);Muscle weakness (generalized) (M62.81);Pain Pain - Right/Left: Right Pain - part of body: Shoulder   Activity Tolerance Patient tolerated treatment well   Patient Left in chair;with call bell/phone within reach;with chair alarm set;with  nursing/sitter in room(telesitter present)   Nurse Communication Mobility status        Time: 0388-8280 OT Time Calculation (min): 45 min  Charges: OT General Charges $OT Visit: 1 Visit OT Treatments $Self Care/Home Management : 23-37 mins $Therapeutic Activity: 8-22 mins  Jonathan [name], OT [facility] Services Pager [phone] Office [phone]    Raymondo Band 04/06/2019, 4:46 PM

## 2019-04-06 NOTE — Progress Notes (Addendum)
[place] for Lovenox Indication: suspected PE  Patient Measurements: Height: 5\' 5"  (165.1 cm) Weight: 137 lb 9.1 oz (62.4 kg) IBW/kg (Calculated) : 61.5  Vital Signs: Temp: 98.2 F (36.8 C) (06/02 0753) Temp Source: Axillary (06/02 0753) BP: 125/80 (06/02 0753) Pulse Rate: 100 (06/02 0753)  Labs: Recent Labs    04/04/19 0559 04/05/19 0555 04/06/19 0435  HGB 9.1* 9.2* 8.6*  HCT 28.4* 28.0* 26.3*  PLT 282 301 321  HEPARINUNFRC 0.27* 0.35 0.33  CREATININE 4.90* 5.41* 5.17*   Estimated Creatinine Clearance: 15.2 mL/min (A) (by C-G formula based on SCr of 5.17 mg/dL (H)).  Medical History: No past medical history on file.  Assessment: 4 YOM admitted on 02/23/2019 with ARDS due to COVID-19 who was on IV heparin for an extended period of time and transitioned to apixaban on 5/23 for r/o PE. Patient then experienced a drop in Hgb on 5/24 requiring 2u PRBCs. Apixaban was stopped but with Hgb remaining stable with no s/sx of overt bleeding, IV heparin resumed 5/25. Patient fell in room the evening of 5/26, and heparin stopped at 2300. CT head negative; thus heparin resumed 5/27 ~ 0200. On 5/30, IV heparin transitioned to SQ Lovenox. Pharmacy consulted for dosing. Note, patient developed AKI requiring CRRT, then IHD.   Today, 04/06/19:  SCr 5.17, now trending down, and patient starting to have an increase in UOP. Per nephrology, holding on further HD for now.  CBC: 8.6 low, but relatively stable. Pltc WNL  Per RN, patient had small amount of bleeding from trach yesterday. No bleeding issues reported today per nursing.   Goal of Therapy:  Anti-Xa level 0.6-1 units/ml 4hrs after LMWH dose given Monitor platelets by anticoagulation protocol: Yes    Plan:  Continue Lovenox 1 mg/kg (60mg ) SQ q24h   Monitor CBC, renal function, and for s/sx of bleeding  Consider Anti-Xa level at steady state   Lindell Spar, PharmD, BCPS Clinical  Pharmacist [phone] Please check AMION for all [place] numbers 04/06/2019 9:12 AM

## 2019-04-06 NOTE — Progress Notes (Addendum)
Nutrition Follow-up  INTERVENTION:   -Provide Nepro Shake PO BID, each supplement provides 425 kcal and 19 grams protein -Continue Prostat liquid protein PO 30 ml TID with meals, each supplement provides 100 kcal, 15 grams protein.  NUTRITION DIAGNOSIS:   Inadequate oral intake related to inability to eat as evidenced by NPO status.  Now on dysphagia 1 diet with nectar thick liquids.  GOAL:   Patient will meet greater than or equal to 90% of their needs  Progressing.  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 Admitted with fever, cough, AMS, and respiratory distress. COVID-19 positive. S/P convalescent plasma transfusion on 5/3. Transferred to [place] on 5/6.  4/22: began CRRT 5/21: extubated 5/26: transferred from [place] to [place] at [facility] for intermittent HD 5/29: passed swallow eval, Cortrak was removed, TF stopped  **RD working remotely**  Per SLP note 6/1, PMV replaced. Recommended pt resume dysphagia 1 diet with nectar thick liquids given dry coughing with PO.  Over the weekend pt's COVID tests came back negative.  Pt has been consuming 50-100% of meals recently. Will order Nepro shakes PO in addition to pt already taking Prostat supplements PO. Last HD was 5/30.  Per weight records, weight is -34 lbs since admit. Per I/O's: +8.4L since 5/19.  Medications: Lasix tablet BID, KCl solution once Labs reviewed: CBGs: 116-120 Low K Elevated Phos -7.6 GFR:12  UOP x 24 hrs: 500 ml  Diet Order:   Diet Order            DIET - DYS 1 Room service appropriate? Yes; Fluid consistency: Nectar Thick  Diet effective now              EDUCATION NEEDS:   No education needs have been identified at this time  Skin:  Skin Assessment: Skin Integrity Issues: Skin Integrity Issues:: DTI, Stage II DTI: L and R side of face -  Now  unstageable per WOC note Stage II: nose  Last BM:  6/1  Height:   Ht Readings from Last 1 Encounters:  03/18/19 5\' 5"  (1.651 m)    Weight:   Wt Readings from Last 1 Encounters:  04/06/19 62.4 kg    Ideal Body Weight:  61.8 kg  BMI:  Body mass index is 22.89 kg/m.  Estimated Nutritional Needs:   Kcal:  1800-2100  Protein:  120 gm  Fluid:  1 L +UOP  Clayton Bibles, MS, RD, LDN [place] Dietitian Pager: [phone] After Hours Pager: [phone]

## 2019-04-06 NOTE — Progress Notes (Signed)
Inpatient Rehabilitation Admissions Coordinator  I contacted Rob Bunting, to request contact with pt's daughter in [place] to ask for preference for pt to remain in [place] at d/c or to return to [place] so that I can assist with planning dispo. She will follow up with me tomorrow.  Danne Baxter, RN, MSN Rehab Admissions Coordinator [phone] 04/06/2019 2:58 PM'

## 2019-04-06 NOTE — Consult Note (Signed)
[place] Nurse wound follow up Assessment performed for MDRPIs to bilat cheeks, which previously occurred related to ETT tape and prone positioning when the patient was critically ill with Covid-19. The patient has improved, is no longer prone, and has a trach, so there are no further medical devices across the cheeks at this time. Wound type:Unstageable pressure injuries to bilat cheeks Left cheeck with loose soft eschar, removes easily when cleansed.  .5X.5X.3cm stage 3 pressure injury revealed, 50% pink, 50% yellow, no odor or drainage.  Right cheek with with loose soft eschar, removes easily when cleansed.  .8X.8X.1cm stage 3 pressure injury revealed, 100% pink, no odor or drainage Dressing procedure/placement/frequency:Continue present plan of care with Bactroban to promote moist healing expect these wounds will eventually evolve into scaring to bilat cheeks.  [place] team will continue to follow for weekly assessment and will adjust topical treatment as needed.  Julien Girt MSN, RN, [place], [place], [place]

## 2019-04-06 NOTE — Progress Notes (Signed)
Patient ID: Jonathan [name], male   DOB: 12/15/1970, 48 y.o.   MRN: [id number] S: 500 urine recorded- also at least 300 in foley bag  - crt seemed to go down - BUN up only slightly - O2 req still high   O:BP 125/80 (BP Location: Right Arm)   Pulse 100   Temp 98.2 F (36.8 C) (Axillary)   Resp (!) 29   Ht 5\' 5"  (1.651 m)   Wt 62.4 kg   SpO2 93%   BMI 22.89 kg/m   Intake/Output Summary (Last 24 hours) at 04/06/2019 0826 Last data filed at 04/05/2019 2350 Gross per 24 hour  Intake 860 ml  Output 500 ml  Net 360 ml   Intake/Output: I/O last 3 completed shifts: In: 1010 [P.O.:990; I.V.:20] Out: 700 [Urine:700]  Intake/Output this shift:  No intake/output data recorded. Weight change: 1.6 kg  gen-  Alert, trach- spanish speaking- some coughing Lungs- CBS bilat  abd- soft, non tender Ext no peripheral edema    Recent Labs  Lab 04/01/19 0917 04/02/19 0707 04/03/19 0531 04/04/19 0559 04/05/19 0555 04/06/19 0435  NA 135 134* 136 136 136 137  K 4.3 4.0 4.2 4.0 3.4* 3.3*  CL 95* 95* 95* 97* 97* 96*  CO2 29 26 24 26 24 26   GLUCOSE 136* 133* 113* 144* 138* 103*  BUN 105* 82* 106* 66* 84* 95*  CREATININE 6.51* 5.73* 6.53* 4.90* 5.41* 5.17*  ALBUMIN  --   --   --   --  3.1* 2.9*  CALCIUM 12.5* 12.2* 13.1* 10.2 10.8* 10.9*  PHOS  --   --   --   --  8.3* 7.6*   Liver Function Tests: Recent Labs  Lab 04/05/19 0555 04/06/19 0435  ALBUMIN 3.1* 2.9*   No results for input(s): LIPASE, AMYLASE in the last 168 hours. No results for input(s): AMMONIA in the last 168 hours. CBC: Recent Labs  Lab 04/02/19 0707 04/03/19 0531 04/04/19 0559 04/05/19 0555 04/06/19 0435  WBC 8.5 8.0 8.7 10.1 10.9*  HGB 8.9* 8.9* 9.1* 9.2* 8.6*  HCT 28.0* 28.1* 28.4* 28.0* 26.3*  MCV 96.9 94.9 96.3 96.6 95.6  PLT 295 323 282 301 321   Cardiac Enzymes: No results for input(s): CKTOTAL, CKMB, CKMBINDEX, TROPONINI in the last 168 hours. CBG: Recent Labs  Lab 04/05/19 1632 04/05/19 1926  04/06/19 0007 04/06/19 0445 04/06/19 0751  GLUCAP 109* 214* 184* 120* 116*    Iron Studies: No results for input(s): IRON, TIBC, TRANSFERRIN, FERRITIN in the last 72 hours. Studies/Results: No results found. . chlorhexidine  15 mL Mouth Rinse BID  . Chlorhexidine Gluconate Cloth  6 each Topical Q0600  . Chlorhexidine Gluconate Cloth  6 each Topical Q0600  . clonazepam  0.25 mg Oral Daily  . darbepoetin (ARANESP) injection - NON-DIALYSIS  100 mcg Subcutaneous Q Sat-1800  . enoxaparin (LOVENOX) injection  60 mg Subcutaneous Q24H  . famotidine  20 mg Oral Daily  . feeding supplement (NEPRO CARB STEADY)  1,000 mL Oral Q24H  . feeding supplement (PRO-STAT SUGAR FREE 64)  30 mL Oral TID  . free water  100 mL Oral Q8H  . guaiFENesin  5 mL Oral BID  . insulin aspart  0-15 Units Subcutaneous Q4H  . insulin aspart  6 Units Subcutaneous Q4H  . insulin glargine  10 Units Subcutaneous BID  . mouth rinse  15 mL Mouth Rinse q12n4p  . mupirocin cream   Topical BID  . oxyCODONE  5 mg Oral QHS  .  pantoprazole  40 mg Oral BID  . potassium chloride  40 mEq Oral Once  . senna-docusate  1 tablet Oral BID  . sodium chloride flush  10-40 mL Intracatheter Q12H  . sodium zirconium cyclosilicate  10 g Oral Daily    BMET    Component Value Date/Time   NA 137 04/06/2019 0435   K 3.3 (L) 04/06/2019 0435   CL 96 (L) 04/06/2019 0435   CO2 26 04/06/2019 0435   GLUCOSE 103 (H) 04/06/2019 0435   BUN 95 (H) 04/06/2019 0435   CREATININE 5.17 (H) 04/06/2019 0435   CALCIUM 10.9 (H) 04/06/2019 0435   GFRNONAA 12 (L) 04/06/2019 0435   GFRAA 14 (L) 04/06/2019 0435   CBC    Component Value Date/Time   WBC 10.9 (H) 04/06/2019 0435   RBC 2.75 (L) 04/06/2019 0435   HGB 8.6 (L) 04/06/2019 0435   HCT 26.3 (L) 04/06/2019 0435   PLT 321 04/06/2019 0435   MCV 95.6 04/06/2019 0435   MCH 31.3 04/06/2019 0435   MCHC 32.7 04/06/2019 0435   RDW 14.8 04/06/2019 0435   LYMPHSABS 3.1 03/14/2019 1206   MONOABS 2.2  (H) 03/14/2019 1206   EOSABS 0.3 03/14/2019 1206   BASOSABS 0.2 (H) 03/14/2019 1206   Brief HPI: Pt is a 48 y.o. yo male who was admitted on 02/23/2019 with anuric AKI due to ATN in the setting of COVID   Assessment/Plan:  1. AKI in setting of covid-19 infection.  Crt 0.95 2 mos ago. Started on CVVHD on 02/23/19 and transitioned to IHD on 04/01/19- doing HD TTS via [facility].  starting to have an increase in UOP.  S/p HD Sat and kept even. Initial crt rise but now went down !  Will hold on HD and follow for now.    Improving- now with 2 negative tests- out of isolation 2. Acute hypoxic respiratory failure s/p trach and off of vent since 03/25/19- still is an issue  3. Anemia- due to acute illness. S/p transfusion on 03/28/19 and started on Aranesp 03/27/19- stable  4. Hypercalcemia- using low Ca dialysate.  Possibly due to immobilization. Phos is up too , no binder yet  5. Dispo- difficult, no insurance.  Have not started CLIP process as do not know dispo and hoping that he may recover renal function ?? 6. HTN/volume-  Since making urine will give some lasix- on no BP meds  7. Hypokalemia- repleting- giving 40 meq today    [name]

## 2019-04-07 DIAGNOSIS — Z01818 Encounter for other preprocedural examination: Secondary | ICD-10-CM

## 2019-04-07 LAB — CBC
HCT: 28.4 % — ABNORMAL LOW (ref 39.0–52.0)
Hemoglobin: 9 g/dL — ABNORMAL LOW (ref 13.0–17.0)
MCH: 29 pg (ref 26.0–34.0)
MCHC: 31.7 g/dL (ref 30.0–36.0)
MCV: 91.6 fL (ref 80.0–100.0)
Platelets: 311 10*3/uL (ref 150–400)
RBC: 3.1 MIL/uL — ABNORMAL LOW (ref 4.22–5.81)
RDW: 14.5 % (ref 11.5–15.5)
WBC: 8 10*3/uL (ref 4.0–10.5)
nRBC: 0 % (ref 0.0–0.2)

## 2019-04-07 LAB — RENAL FUNCTION PANEL
Albumin: 3 g/dL — ABNORMAL LOW (ref 3.5–5.0)
Anion gap: 14 (ref 5–15)
BUN: 99 mg/dL — ABNORMAL HIGH (ref 6–20)
CO2: 26 mmol/L (ref 22–32)
Calcium: 11.2 mg/dL — ABNORMAL HIGH (ref 8.9–10.3)
Chloride: 94 mmol/L — ABNORMAL LOW (ref 98–111)
Creatinine, Ser: 4.25 mg/dL — ABNORMAL HIGH (ref 0.61–1.24)
GFR calc Af Amer: 18 mL/min — ABNORMAL LOW (ref >60)
GFR calc non Af Amer: 15 mL/min — ABNORMAL LOW (ref >60)
Glucose, Bld: 221 mg/dL — ABNORMAL HIGH (ref 70–99)
Phosphorus: 6.5 mg/dL — ABNORMAL HIGH (ref 2.5–4.6)
Potassium: 2.8 mmol/L — ABNORMAL LOW (ref 3.5–5.1)
Sodium: 134 mmol/L — ABNORMAL LOW (ref 135–145)

## 2019-04-07 LAB — GLUCOSE, CAPILLARY
Glucose-Capillary: 137 mg/dL — ABNORMAL HIGH (ref 70–99)
Glucose-Capillary: 98 mg/dL (ref 70–99)
Glucose-Capillary: 158 mg/dL — ABNORMAL HIGH (ref 70–99)
Glucose-Capillary: 173 mg/dL — ABNORMAL HIGH (ref 70–99)
Glucose-Capillary: 160 mg/dL — ABNORMAL HIGH (ref 70–99)

## 2019-04-07 LAB — HEPARIN ANTI-XA: Heparin LMW: 1 IU/mL

## 2019-04-07 MED ORDER — POTASSIUM CHLORIDE 10 MEQ/100ML IV SOLN
10.0000 meq | INTRAVENOUS | Status: DC
  Filled 2019-04-07: qty 100

## 2019-04-07 MED ORDER — POTASSIUM CHLORIDE CRYS ER 20 MEQ PO TBCR
40.0000 meq | EXTENDED_RELEASE_TABLET | Freq: Two times a day (BID) | ORAL | Status: AC
  Administered 2019-04-07 – 2019-04-08 (×4): 40 meq via ORAL
  Filled 2019-04-07 (×4): qty 2

## 2019-04-07 MED ORDER — PROTAMINE SULFATE 10 MG/ML IV SOLN
60.0000 mg | Freq: Once | INTRAVENOUS | Status: AC
  Administered 2019-04-07: 60 mg via INTRAVENOUS
  Filled 2019-04-07 (×2): qty 6

## 2019-04-07 MED ORDER — POTASSIUM CHLORIDE CRYS ER 20 MEQ PO TBCR
40.0000 meq | EXTENDED_RELEASE_TABLET | Freq: Once | ORAL | Status: AC
  Administered 2019-04-07: 40 meq via ORAL
  Filled 2019-04-07: qty 2

## 2019-04-07 MED ORDER — "THROMBI-PAD 3""X3"" EX PADS"
1.0000 | MEDICATED_PAD | Freq: Once | CUTANEOUS | Status: AC
  Administered 2019-04-07: 1 via TOPICAL
  Filled 2019-04-07 (×2): qty 1

## 2019-04-07 MED ORDER — POTASSIUM CHLORIDE 20 MEQ PO PACK: 40.0000 meq | PACK | Freq: Two times a day (BID) | ORAL | Status: DC

## 2019-04-07 NOTE — Progress Notes (Signed)
Assisted tele visit to patient with family member.  Iverna Hammac Anderson, RN   

## 2019-04-07 NOTE — Plan of Care (Signed)
  Problem: Health Behavior/Discharge Planning: Goal: Ability to manage health-related needs will improve Outcome: Not Progressing   Problem: Clinical Measurements: Goal: Respiratory complications will improve Outcome: Progressing   Problem: Nutrition: Goal: Adequate nutrition will be maintained Outcome: Progressing   Problem: Coping: Goal: Level of anxiety will decrease Outcome: Progressing   Problem: Pain Managment: Goal: General experience of comfort will improve Outcome: Progressing

## 2019-04-07 NOTE — Progress Notes (Signed)
ANTICOAGULATION CONSULT NOTE - Initial Consult  Pharmacy Consult for enoxaparin Indication: PE  Not on File  Patient Measurements: Height: 5\' 5"  (165.1 cm) Weight: 137 lb 12.6 oz (62.5 kg) IBW/kg (Calculated) : 61.5 Heparin Dosing Weight: 62.5kg  Vital Signs: Temp: 98.6 F (37 C) (06/03 1640) Temp Source: Oral (06/03 1640) BP: 130/83 (06/03 1640) Pulse Rate: 102 (06/03 1640)  Labs: Recent Labs    04/05/19 0555 04/06/19 0435 04/07/19 0939 04/07/19 1628  HGB 9.2* 8.6* 9.0*  --   HCT 28.0* 26.3* 28.4*  --   PLT 301 321 311  --   HEPARINUNFRC 0.35 0.33  --   --   HEPRLOWMOCWT  --   --   --  1.00  CREATININE 5.41* 5.17* 4.25*  --     Estimated Creatinine Clearance: 18.5 mL/min (A) (by C-G formula based on SCr of 4.25 mg/dL (H)).   Assessment: 12 YOM who was on IV heparin for an extended period of time and transitioned to apixaban on 5/23 for r/o PE. Pt then experienced a drop in Hgb on 5/24 requiring 2u PRBCs. Apixaban was stopped but now hgb remains stable with no s/s of overt bleeding.  Patient transitioned to SQ enoxaparin 60mg  q24h (adjusted for renal dysfunction) on 5/30.   AntiXa level 1 units/ml at 4 hours post dose (in range)  Goal of Therapy:  Anti-Xa level 0.6-1 units/ml 4hrs after LMWH dose given Monitor platelets by anticoagulation protocol: Yes   Plan:  Continue Enoxaparin 60mg  SQ Q24h Monitor for bleeding and renal function improvement.    Bill Mcvey A. Levada Dy, PharmD, [place] Please utilize Amion for appropriate phone number to reach the unit pharmacist ([place])   04/07/2019,5:41 PM

## 2019-04-07 NOTE — Significant Event (Signed)
Called to bedside due to bleeding from a left cheek wound. Patient is on treatment-dose Lovenox for suspected PE. There is blood spurting from his left cheek despite holding pressure and using thrombin pads. RN has been providing constant direct pressure for the past 15 minutes and blood continues to spurt. He is tachycardic in 120's with stable BP. He has been typed and screened, H&H pending, Lovenox stopped, and protamine was ordered. With continued bleeding, ENT was consulted, there care much appreciated.

## 2019-04-07 NOTE — Progress Notes (Signed)
PROGRESS NOTE    Jonathan [name]  JOI:[id number] DOB: Jul 21, 1971 DOA: 02/23/2019 PCP: Patient, No Pcp Per  Brief Narrative: 48 year old Spanish-speaking male from [place], was admitted on 4/21 with increased respiratory distress, he was intubated on admission for ARDS due to SARS COVID-19 infection. -Unfortunately developed severe ATN required CRRT, prolonged respiratory failure , was extubated and reintubated earlier this month, subsequently underwent tracheostomy on 5/12 , followed by placement of a permacath on 5/13 -Ventrally liberated off the ventilator 5/21 however remained anuric requiring intermittent hemodialysis subsequently transferred to [facility] 5/26 from [facility]. -Was also started on anticoagulation while in ICU for suspected pulmonary embolism -5/29 passed swallow evaluation, his core track was removed and he was started on dysphagia diet with nectar thick liquids, last HD 5/30, hopeful for renal recovery -Repeat COVID tested negative x2 on 5/29 and 5/30 Events: 02/23/2019 admitted to the ICU. 02/24/2019 started on CRRT 4/21-4/24 Proned daily 4/28 -4/30 proned daily  03/04/2019 increase in oxygen demand required paralytic for 24 hours. 03/09/2019 hematochezia from gastric aspiration 5/11 extubated 5/12 re-intubated, tracheostomy 5/13 perm cath 5/14 worsening hypoxemia, shock suddenly, RV dilated, no new CXR changes, treated for PE empirically 5/21 doing t-piece trials 5/23: Trial off of CVVHD 5/24: 2 units PRBC 5/25 remains off of ventilator support since May 21, remains anuric, 5/26  patient transferred to [facility] from [facility] as he needed intermittent hemodialysis  5/29 passed modified swallow eval 5/30 started on nectar thick liquis and taking pills by mouth,  dialysis  Lines and tubes: 4/21 - ETT >>5.11.2020 4/21 - RIJ CVC, removed 4/22 L IJ HD Cath> removed 5/13 Permacath right Antibiotics: 02/23/2019-425 2020 azithromycin and Rocephin 02/23/2019-02/24/2019  03/06/2019 Rocephin 5/11-5/17Vancomycin 5/12-5/18Zosyn 5/12-5/17Fluconazole for candida in tracheal aspirate and recent Actemra  Significant diagnostic Tests; POC U/S 4/21: Bilateral interstitial pattern with bilateral dense consolidation at bases Echo shows low-normal LVSF. RV moderately dilated with septal shift.  5/14 Echo> LVEF > 65%, RV dilated, decreased RV function (moderate) with RVSP 60  Assessment & Plan:   Active Problems:   ARDS (adult respiratory distress syndrome) (HCC)   Pressure injury of skin   Acute respiratory failure with hypoxemia (HCC)   COVID-19 virus infection   AKI (acute kidney injury) ([place])   ATN (acute tubular necrosis) ([place])   Tracheostomy care ([place])   On tube feeding diet   Status post tracheostomy ([place])   History of ETT  Acute Hypoxic Resp. Failure due to Acute Covid 19 Viral Illness/ARDS/septic shock  -Treated at [facility] with IV steroids, IV Actemra on 4/22, -Also received convalescent plasma on 5/3 -Status post tracheostomy 5/12 -Weaned off the ventilator 5/21 -Still has moderate O2 requirement with FiO2 of 60% -Chest x-ray 5/30 showed low lung volumes and patchy airspace disease which was improving -Complicated by ATN, anuria initially was on CRRT, now on intermittent hemodialysis, hoping for renal recovery -CCM following intermittently for trach care, trach was downsized to #4 uncuffed [place] on 5/26 -Clinically improving from a respiratory standpoint, O2 weaned down to 8 L high flow this morning still with some desaturation, per pulmonary if no significant issues they will consider capping trial this week with a goal towards early decannulation -CIR consulted and foll  Presumed acute PE -On 5/14 patient had a sudden decline with shock, worsening hypoxemia and echocardiogram which showed right heart strain -CT angiogram could not be obtained due to severe AKI -He was started on IV heparin for presumed PE, Dopplers were negative -3 months of  anticoagulation recommended -  Continue Lovenox for now, renal dose Eliquis may be an option short-term  Acute kidney injury due to ATN/Covid -Baseline creatinine was normal 2 months ago, he started CRRT on 4/21 and transition to intermittent hemodialysis 5/28, was undergoing HD Tuesday Thursday Saturday via [facility], has had mild improvement in urine output -Last hemodialysis was 5/30, mild improvement in urine output noted, 500 cc last 24 hours, labs stable -Nephrology following hopefully continues to have renal recovery  Anemia due to critical illness- -No evidance  of significant GI bleed, -Has had small to modest amount of bleeding from tracheostomy trauma/suctioning -Monitor -Received 2 units PRBC 03/28/2019 -Hemoglobin remains stable  Dysphagia -Insetting of subacute respiratory failure, tracheostomy -SLP recommends dysphagia 1 puree nectar thick liquid.  Hyperglycemia -No clear history of diabetes, hemoglobin A1c was 5.5 -Remains on Lantus 10 units twice daily and sliding scale insulin, CBGs are less than 170  Pressure injury on the skin of nose.bilateral cheeks Local wound care. -Appears to be healing  DVT Prophylaxis: Lovenox  PUD Prophylaxis: On famotidine/ppi Code Status: Partial code Family Communication:  Wife [employer] manager number [phone].  Dr.Fang d/w family regularly via interpreter (Interpreter number is [phone]. If you call her she will connect you to family and interpret for you) disposition Plan: needs trach capping trials, possible decannulation speech eval, PMV and diet advancement, depends on ongoing renal recovery Now a candidate for CIR repeat COVID 19 test negative x2 on 5/28 and 5/30   Consultants: Pulmonology.  Nephrology.   Pressure Injury 03/03/19 Face Right Unstageable - Full thickness tissue loss in which the base of the ulcer is covered by slough (yellow, tan, gray, green or brown) and/or eschar (tan, brown or  black) in the wound bed. 2cm x2 1/4 cm  red edges  with blac (Active)  03/03/19 0830  Location: Face  Location Orientation: Right  Staging: Unstageable - Full thickness tissue loss in which the base of the ulcer is covered by slough (yellow, tan, gray, green or brown) and/or eschar (tan, brown or black) in the wound bed.  Wound Description (Comments): 2cm x2 1/4 cm  red edges  with black cneter  Present on Admission: No     Pressure Injury 03/03/19 Face Left Unstageable - Full thickness tissue loss in which the base of the ulcer is covered by slough (yellow, tan, gray, green or brown) and/or eschar (tan, brown or black) in the wound bed. 1 1/2 cm x 2 1/2 cm red edges with y (Active)  03/03/19 0830  Location: Face  Location Orientation: Left  Staging: Unstageable - Full thickness tissue loss in which the base of the ulcer is covered by slough (yellow, tan, gray, green or brown) and/or eschar (tan, brown or black) in the wound bed.  Wound Description (Comments): 1 1/2 cm x 2 1/2 cm red edges with yellow center and black ring around yellow area  Present on Admission: No      Nutrition Problem: Inadequate oral intake Etiology: inability to eat     Signs/Symptoms: NPO status    Interventions: Nepro shake  Estimated body mass index is 22.93 kg/m as calculated from the following:   Height as of this encounter: 5\' 5"  (1.651 m).   Weight as of this encounter: 62.5 kg.  Subjective: Sitting up in bed feels okay  Objective: Vitals:   04/07/19 0717 04/07/19 0752 04/07/19 1110 04/07/19 1221  BP:  132/77 124/78 117/72  Pulse: 93 97 (!) 106 (!) 116  Resp: (!) 22 (!) 28 (!)  26 (!) 27  Temp:  98.3 F (36.8 C)  98.6 F (37 C)  TempSrc:  Oral  Oral  SpO2: 96% 95% 97% 93%  Weight:      Height:        Intake/Output Summary (Last 24 hours) at 04/07/2019 1434 Last data filed at 04/07/2019 0522 Gross per 24 hour  Intake 120 ml  Output 1000 ml  Net -880 ml   Filed Weights   04/05/19  0418 04/06/19 0411 04/07/19 0507  Weight: 60.8 kg 62.4 kg 62.5 kg    Examination:  General exam: Appears calm and comfortable  Respiratory system: Coarse  to auscultation. Respiratory effort normal. Cardiovascular system: S1 & S2 heard, RRR. No JVD, murmurs, rubs, gallops or clicks. No pedal edema. Gastrointestinal system: Abdomen is nondistended, soft and nontender. No organomegaly or masses felt. Normal bowel sounds heard. Central nervous system: Alert and oriented. No focal neurological deficits. Extremities: Symmetric 5 x 5 power. Skin: No rashes, lesions or ulcers.     Data Reviewed: I have personally reviewed following labs and imaging studies  CBC: Recent Labs  Lab 04/03/19 0531 04/04/19 0559 04/05/19 0555 04/06/19 0435 04/07/19 0939  WBC 8.0 8.7 10.1 10.9* 8.0  HGB 8.9* 9.1* 9.2* 8.6* 9.0*  HCT 28.1* 28.4* 28.0* 26.3* 28.4*  MCV 94.9 96.3 96.6 95.6 91.6  PLT 323 282 301 321 026   Basic Metabolic Panel: Recent Labs  Lab 04/03/19 0531 04/04/19 0559 04/05/19 0555 04/06/19 0435 04/07/19 0939  NA 136 136 136 137 134*  K 4.2 4.0 3.4* 3.3* 2.8*  CL 95* 97* 97* 96* 94*  CO2 24 26 24 26 26   GLUCOSE 113* 144* 138* 103* 221*  BUN 106* 66* 84* 95* 99*  CREATININE 6.53* 4.90* 5.41* 5.17* 4.25*  CALCIUM 13.1* 10.2 10.8* 10.9* 11.2*  PHOS  --   --  8.3* 7.6* 6.5*   GFR: Estimated Creatinine Clearance: 18.5 mL/min (A) (by C-G formula based on SCr of 4.25 mg/dL (H)). Liver Function Tests: Recent Labs  Lab 04/05/19 0555 04/06/19 0435 04/07/19 0939  ALBUMIN 3.1* 2.9* 3.0*   No results for input(s): LIPASE, AMYLASE in the last 168 hours. No results for input(s): AMMONIA in the last 168 hours. Coagulation Profile: No results for input(s): INR, PROTIME in the last 168 hours. Cardiac Enzymes: No results for input(s): CKTOTAL, CKMB, CKMBINDEX, TROPONINI in the last 168 hours. BNP (last 3 results) No results for input(s): PROBNP in the last 8760 hours. HbA1C: No  results for input(s): HGBA1C in the last 72 hours. CBG: Recent Labs  Lab 04/06/19 1951 04/06/19 2355 04/07/19 0307 04/07/19 0756 04/07/19 1220  GLUCAP 201* 147* 137* 98 158*   Lipid Profile: No results for input(s): CHOL, HDL, LDLCALC, TRIG, CHOLHDL, LDLDIRECT in the last 72 hours. Thyroid Function Tests: No results for input(s): TSH, T4TOTAL, FREET4, T3FREE, THYROIDAB in the last 72 hours. Anemia Panel: No results for input(s): VITAMINB12, FOLATE, FERRITIN, TIBC, IRON, RETICCTPCT in the last 72 hours. Sepsis Labs: No results for input(s): PROCALCITON, LATICACIDVEN in the last 168 hours.  Recent Results (from the past 240 hour(s))  SARS Coronavirus 2 (CEPHEID- Performed in [place] hospital lab), Hosp Order     Status: None   Collection Time: 04/02/19 12:40 PM  Result Value Ref Range Status   SARS Coronavirus 2 NEGATIVE NEGATIVE Final    Comment: (NOTE) If result is NEGATIVE SARS-CoV-2 target nucleic acids are NOT DETECTED. The SARS-CoV-2 RNA is generally detectable in upper and lower  respiratory  specimens during the acute phase of infection. The lowest  concentration of SARS-CoV-2 viral copies this assay can detect is 250  copies / mL. A negative result does not preclude SARS-CoV-2 infection  and should not be used as the sole basis for treatment or other  patient management decisions.  A negative result may occur with  improper specimen collection / handling, submission of specimen other  than nasopharyngeal swab, presence of viral mutation(s) within the  areas targeted by this assay, and inadequate number of viral copies  (<250 copies / mL). A negative result must be combined with clinical  observations, patient history, and epidemiological information. If result is POSITIVE SARS-CoV-2 target nucleic acids are DETECTED. The SARS-CoV-2 RNA is generally detectable in upper and lower  respiratory specimens dur ing the acute phase of infection.  Positive  results are  indicative of active infection with SARS-CoV-2.  Clinical  correlation with patient history and other diagnostic information is  necessary to determine patient infection status.  Positive results do  not rule out bacterial infection or co-infection with other viruses. If result is PRESUMPTIVE POSTIVE SARS-CoV-2 nucleic acids MAY BE PRESENT.   A presumptive positive result was obtained on the submitted specimen  and confirmed on repeat testing.  While 2019 novel coronavirus  (SARS-CoV-2) nucleic acids may be present in the submitted sample  additional confirmatory testing may be necessary for epidemiological  and / or clinical management purposes  to differentiate between  SARS-CoV-2 and other Sarbecovirus currently known to infect humans.  If clinically indicated additional testing with an alternate test  methodology [phone]) is advised. The SARS-CoV-2 RNA is generally  detectable in upper and lower respiratory sp ecimens during the acute  phase of infection. The expected result is Negative. Fact Sheet for Patients:  StrictlyIdeas.no Fact Sheet for Healthcare Providers: BankingDealers.co.za This test is not yet approved or cleared by the [place] FDA and has been authorized for detection and/or diagnosis of SARS-CoV-2 by FDA under an Emergency Use Authorization (EUA).  This EUA will remain in effect (meaning this test can be used) for the duration of the COVID-19 declaration under Section 564(b)(1) of the Act, 21 U.S.C. section 360bbb-3(b)(1), unless the authorization is terminated or revoked sooner. Performed at [facility], [place] [address] [place], [place] [address]   SARS Coronavirus 2 (CEPHEID- Performed in [place] hospital lab), Hosp Order     Status: None   Collection Time: 04/03/19 11:28 AM  Result Value Ref Range Status   SARS Coronavirus 2 NEGATIVE NEGATIVE Final    Comment: (NOTE) If result is NEGATIVE  SARS-CoV-2 target nucleic acids are NOT DETECTED. The SARS-CoV-2 RNA is generally detectable in upper and lower  respiratory specimens during the acute phase of infection. The lowest  concentration of SARS-CoV-2 viral copies this assay can detect is 250  copies / mL. A negative result does not preclude SARS-CoV-2 infection  and should not be used as the sole basis for treatment or other  patient management decisions.  A negative result may occur with  improper specimen collection / handling, submission of specimen other  than nasopharyngeal swab, presence of viral mutation(s) within the  areas targeted by this assay, and inadequate number of viral copies  (<250 copies / mL). A negative result must be combined with clinical  observations, patient history, and epidemiological information. If result is POSITIVE SARS-CoV-2 target nucleic acids are DETECTED. The SARS-CoV-2 RNA is generally detectable in upper and lower  respiratory specimens dur  ing the acute phase of infection.  Positive  results are indicative of active infection with SARS-CoV-2.  Clinical  correlation with patient history and other diagnostic information is  necessary to determine patient infection status.  Positive results do  not rule out bacterial infection or co-infection with other viruses. If result is PRESUMPTIVE POSTIVE SARS-CoV-2 nucleic acids MAY BE PRESENT.   A presumptive positive result was obtained on the submitted specimen  and confirmed on repeat testing.  While 2019 novel coronavirus  (SARS-CoV-2) nucleic acids may be present in the submitted sample  additional confirmatory testing may be necessary for epidemiological  and / or clinical management purposes  to differentiate between  SARS-CoV-2 and other Sarbecovirus currently known to infect humans.  If clinically indicated additional testing with an alternate test  methodology [phone]) is advised. The SARS-CoV-2 RNA is generally  detectable in upper  and lower respiratory sp ecimens during the acute  phase of infection. The expected result is Negative. Fact Sheet for Patients:  StrictlyIdeas.no Fact Sheet for Healthcare Providers: BankingDealers.co.za This test is not yet approved or cleared by the [place] FDA and has been authorized for detection and/or diagnosis of SARS-CoV-2 by FDA under an Emergency Use Authorization (EUA).  This EUA will remain in effect (meaning this test can be used) for the duration of the COVID-19 declaration under Section 564(b)(1) of the Act, 21 U.S.C. section 360bbb-3(b)(1), unless the authorization is terminated or revoked sooner. Performed at [facility], [place] [address] [place], [place] [address]          Radiology Studies: No results found.      Scheduled Meds: . chlorhexidine  15 mL Mouth Rinse BID  . Chlorhexidine Gluconate Cloth  6 each Topical Q0600  . Chlorhexidine Gluconate Cloth  6 each Topical Q0600  . clonazepam  0.25 mg Oral Daily  . darbepoetin (ARANESP) injection - NON-DIALYSIS  100 mcg Subcutaneous Q Sat-1800  . enoxaparin (LOVENOX) injection  60 mg Subcutaneous Q24H  . famotidine  20 mg Oral Daily  . feeding supplement (NEPRO CARB STEADY)  237 mL Oral BID BM  . feeding supplement (PRO-STAT SUGAR FREE 64)  30 mL Oral TID  . free water  100 mL Oral Q8H  . furosemide  40 mg Oral BID  . guaiFENesin  5 mL Oral BID  . insulin aspart  0-15 Units Subcutaneous Q4H  . insulin aspart  6 Units Subcutaneous Q4H  . insulin glargine  10 Units Subcutaneous BID  . mouth rinse  15 mL Mouth Rinse q12n4p  . mupirocin cream   Topical BID  . pantoprazole  40 mg Oral BID  . potassium chloride  40 mEq Oral BID  . potassium chloride  40 mEq Oral Once  . senna-docusate  1 tablet Oral BID  . sodium chloride flush  10-40 mL Intracatheter Q12H   Continuous Infusions: . sodium chloride    . sodium chloride Stopped (03/21/19 2128)      LOS: 18 days     Georgette Shell, MD [facility]   If 7PM-7AM, please contact night-coverage [email or web address] Password [facility] 04/07/2019, 2:34 PM

## 2019-04-07 NOTE — Progress Notes (Signed)
Patient ID: Jonathan [name], male   DOB: [birth year], 48 y.o.   MRN: [id number] S: 1000 of urine recorded- crt down- BUN stable- k is down more    O:BP 132/77 (BP Location: Left Arm)   Pulse 97   Temp 98.3 F (36.8 C) (Oral)   Resp (!) 28   Ht 5\' 5"  (1.651 m)   Wt 62.5 kg   SpO2 95%   BMI 22.93 kg/m   Intake/Output Summary (Last 24 hours) at 04/07/2019 0853 Last data filed at 04/07/2019 0522 Gross per 24 hour  Intake 480 ml  Output 1001 ml  Net -521 ml   Intake/Output: I/O last 3 completed shifts: In: 500 [P.O.:480; I.V.:20] Out: 1001 [Urine:1001]  Intake/Output this shift:  No intake/output data recorded. Weight change: 0.1 kg  gen-  Alert, trach- spanish speaking- some coughing Lungs- CBS bilat  abd- soft, non tender Ext no peripheral edema    Recent Labs  Lab 04/01/19 0917 04/02/19 0707 04/03/19 0531 04/04/19 0559 04/05/19 0555 04/06/19 0435 04/07/19 0939  NA 135 134* 136 136 136 137 134*  K 4.3 4.0 4.2 4.0 3.4* 3.3* 2.8*  CL 95* 95* 95* 97* 97* 96* 94*  CO2 29 26 24 26 24 26 26   GLUCOSE 136* 133* 113* 144* 138* 103* 221*  BUN 105* 82* 106* 66* 84* 95* 99*  CREATININE 6.51* 5.73* 6.53* 4.90* 5.41* 5.17* 4.25*  ALBUMIN  --   --   --   --  3.1* 2.9* 3.0*  CALCIUM 12.5* 12.2* 13.1* 10.2 10.8* 10.9* 11.2*  PHOS  --   --   --   --  8.3* 7.6* 6.5*   Liver Function Tests: Recent Labs  Lab 04/05/19 0555 04/06/19 0435  ALBUMIN 3.1* 2.9*   No results for input(s): LIPASE, AMYLASE in the last 168 hours. No results for input(s): AMMONIA in the last 168 hours. CBC: Recent Labs  Lab 04/02/19 0707 04/03/19 0531 04/04/19 0559 04/05/19 0555 04/06/19 0435  WBC 8.5 8.0 8.7 10.1 10.9*  HGB 8.9* 8.9* 9.1* 9.2* 8.6*  HCT 28.0* 28.1* 28.4* 28.0* 26.3*  MCV 96.9 94.9 96.3 96.6 95.6  PLT 295 323 282 301 321   Cardiac Enzymes: No results for input(s): CKTOTAL, CKMB, CKMBINDEX, TROPONINI in the last 168 hours. CBG: Recent Labs  Lab 04/06/19 1648 04/06/19 1951  04/06/19 2355 04/07/19 0307 04/07/19 0756  GLUCAP 163* 201* 147* 137* 98    Iron Studies: No results for input(s): IRON, TIBC, TRANSFERRIN, FERRITIN in the last 72 hours. Studies/Results: No results found. . chlorhexidine  15 mL Mouth Rinse BID  . Chlorhexidine Gluconate Cloth  6 each Topical Q0600  . Chlorhexidine Gluconate Cloth  6 each Topical Q0600  . clonazepam  0.25 mg Oral Daily  . darbepoetin (ARANESP) injection - NON-DIALYSIS  100 mcg Subcutaneous Q Sat-1800  . enoxaparin (LOVENOX) injection  60 mg Subcutaneous Q24H  . famotidine  20 mg Oral Daily  . feeding supplement (NEPRO CARB STEADY)  237 mL Oral BID BM  . feeding supplement (PRO-STAT SUGAR FREE 64)  30 mL Oral TID  . free water  100 mL Oral Q8H  . furosemide  40 mg Oral BID  . guaiFENesin  5 mL Oral BID  . insulin aspart  0-15 Units Subcutaneous Q4H  . insulin aspart  6 Units Subcutaneous Q4H  . insulin glargine  10 Units Subcutaneous BID  . mouth rinse  15 mL Mouth Rinse q12n4p  . mupirocin cream   Topical BID  .  pantoprazole  40 mg Oral BID  . senna-docusate  1 tablet Oral BID  . sodium chloride flush  10-40 mL Intracatheter Q12H    BMET    Component Value Date/Time   NA 137 04/06/2019 0435   K 3.3 (L) 04/06/2019 0435   CL 96 (L) 04/06/2019 0435   CO2 26 04/06/2019 0435   GLUCOSE 103 (H) 04/06/2019 0435   BUN 95 (H) 04/06/2019 0435   CREATININE 5.17 (H) 04/06/2019 0435   CALCIUM 10.9 (H) 04/06/2019 0435   GFRNONAA 12 (L) 04/06/2019 0435   GFRAA 14 (L) 04/06/2019 0435   CBC    Component Value Date/Time   WBC 10.9 (H) 04/06/2019 0435   RBC 2.75 (L) 04/06/2019 0435   HGB 8.6 (L) 04/06/2019 0435   HCT 26.3 (L) 04/06/2019 0435   PLT 321 04/06/2019 0435   MCV 95.6 04/06/2019 0435   MCH 31.3 04/06/2019 0435   MCHC 32.7 04/06/2019 0435   RDW 14.8 04/06/2019 0435   LYMPHSABS 3.1 03/14/2019 1206   MONOABS 2.2 (H) 03/14/2019 1206   EOSABS 0.3 03/14/2019 1206   BASOSABS 0.2 (H) 03/14/2019 1206    Brief HPI: Pt is a 48 y.o. yo male who was admitted on 02/23/2019 with anuric AKI due to ATN in the setting of COVID   Assessment/Plan:  1. AKI in setting of covid-19 infection.  Crt 0.95 2 mos ago. Started on CVVHD on 02/23/19 and transitioned to IHD on 04/01/19- doing HD TTS via [facility].  starting to have an increase in UOP.  Last HD Sat and kept even. Initial crt rise but now went down now 2 days in a row- BUN lagging behind which is not unusual !  Will continue to hold on HD and follow    2. Covid-  Improving- now with 2 negative tests- out of isolation 3. Acute hypoxic respiratory failure s/p trach and off of vent since 03/25/19- still is an issue  4. Anemia- due to acute illness. S/p transfusion on 03/28/19 and started on Aranesp 03/27/19- stable  5. Hypercalcemia- using low Ca dialysate.  Possibly due to immobilization. Phos is up but getting better , no binder yet  6. Dispo- difficult, no insurance.  Have not started CLIP process as do not know dispo and hoping that he may recover renal function ?? 6. HTN/volume-  Since making urine will give some lasix, cont - on no BP meds  7. Hypokalemia- giving total of 80 meq today oral with 50 iv   Brittny Spangle A Zanyla Klebba

## 2019-04-07 NOTE — Progress Notes (Signed)
Physical Therapy Treatment Patient Details Name: Jonathan [name] MRN: [id number] DOB: 1971-02-09 Today's Date: 04/07/2019    History of Present Illness 48 y.o. male admitted on 02/23/19 with AMS, O2 sats in the ED were 50%.  Pt dx with COVID 19 and resultant ARDS.  He was intubated 4/21-5/11. Reintubated and trached 5/12. Off vent and on Tbar since 5/21.  His course is complicated by acute kidney injury requiring CVVHD.  Pt transferred back to [facility] on 5/26 and changed to intermittent HD.     PT Comments    Continuing work on functional mobility and activity tolerance;  Making notable, steady improvements with activity tolerance, and functional mobility; He seems pleased as well   Follow Up Recommendations  CIR     Equipment Recommendations  Rolling walker with 5" wheels;3in1 (PT)    Recommendations for Other Services       Precautions / Restrictions Precautions Precautions: Fall Precaution Comments: R IJ HD catheter, trach    Mobility  Bed Mobility Overal bed mobility: Needs Assistance Bed Mobility: Supine to Sit     Supine to sit: Min assist;HOB elevated     General bed mobility comments: Assist to elevate trunk into sitting, to ensure safety with lines  Transfers Overall transfer level: Needs assistance Equipment used: Rolling walker (2 wheeled) Transfers: Sit to/from Stand Sit to Stand: Min assist;+2 safety/equipment         General transfer comment: Verbal/visual cues for safe hand placement; light assist to rise and steady at [name]  Ambulation/Gait Ambulation/Gait assistance: Min assist;+2 safety/equipment Gait Distance (Feet): 120 Feet(2 seated rest breaks`) Assistive device: Rolling walker (2 wheeled) Gait Pattern/deviations: Step-through pattern;Decreased step length - right;Decreased step length - left;Narrow base of support Gait velocity: decr   General Gait Details: Assist for balance and support; Walked on supplemental O2 55% with trach; O2  sats would drop to mid 80s with amb, and incr back to low 90s with seated rest; noting trunk weakness and instability; dependent on RW for stability   Stairs             Wheelchair Mobility    Modified Rankin (Stroke Patients Only)       Balance     Sitting balance-Leahy Scale: Fair       Standing balance-Leahy Scale: Poor Standing balance comment: walker and min assist for static standing                            Cognition Arousal/Alertness: Awake/alert Behavior During Therapy: WFL for tasks assessed/performed Overall Cognitive Status: Difficult to assess                                 General Comments: pt following commands consistently; appears to have increased awareness; making therapist, RN aware of certain items/needs during session (i.e. ensuring catheter in proper position, ensuring he has his call bell end of session)      Exercises      General Comments General comments (skin integrity, edema, etc.): HR tending to get tachy with activity, noting 140 bpm observed highest; still desats with activity, but rebounds well with seated rest      Pertinent Vitals/Pain Pain Assessment: Faces Faces Pain Scale: Hurts a little bit Pain Location: R shoulder with ROM Pain Descriptors / Indicators: Discomfort;Sore Pain Intervention(s): Monitored during session    Home Living  Prior Function            PT Goals (current goals can now be found in the care plan section) Acute Rehab PT Goals Patient Stated Goal: to walk in the hallway PT Goal Formulation: Patient unable to participate in goal setting Time For Goal Achievement: 04/13/19 Potential to Achieve Goals: Good Progress towards PT goals: Progressing toward goals    Frequency    Min 3X/week      PT Plan Current plan remains appropriate    Co-evaluation              AM-PAC PT "6 Clicks" Mobility   Outcome Measure  Help needed  turning from your back to your side while in a flat bed without using bedrails?: A Little Help needed moving from lying on your back to sitting on the side of a flat bed without using bedrails?: A Little Help needed moving to and from a bed to a chair (including a wheelchair)?: A Little Help needed standing up from a chair using your arms (e.g., wheelchair or bedside chair)?: A Little Help needed to walk in hospital room?: A Lot Help needed climbing 3-5 steps with a railing? : Total 6 Click Score: 15    End of Session Equipment Utilized During Treatment: Oxygen Activity Tolerance: Patient tolerated treatment well Patient left: in chair;with call bell/phone within reach;with chair alarm set Nurse Communication: Mobility status PT Visit Diagnosis: Muscle weakness (generalized) (M62.81);Difficulty in walking, not elsewhere classified (R26.2)     Time: 7106-2694 PT Time Calculation (min) (ACUTE ONLY): 36 min  Charges:  $Gait Training: 23-37 mins                     Roney Marion, [place]  Acute Rehabilitation Services Pager [phone] Office [place] 04/07/2019, 4:18 PM

## 2019-04-07 NOTE — Consult Note (Signed)
Reason for Consult:bleeding wound Referring Physician: hospi  Jonathan [name] is an 48 y.o. male.  HPI: hx of wound on face that started bleeding earlier today. It has not stopped with pressure. The patient is on Lovenox. He has trach  No past medical history on file.    No family history on file.  Social History:  reports that he has never smoked. He has never used smokeless tobacco. He reports previous alcohol use. He reports previous drug use.  Allergies: Not on File  Medications: I have reviewed the patient's current medications.  Results for orders placed or performed during the hospital encounter of 02/23/19 (from the past 48 hour(s))  Glucose, capillary     Status: Abnormal   Collection Time: 04/06/19 12:07 AM  Result Value Ref Range   Glucose-Capillary 184 (H) 70 - 99 mg/dL  Heparin level (unfractionated)     Status: None   Collection Time: 04/06/19  4:35 AM  Result Value Ref Range   Heparin Unfractionated 0.33 0.30 - 0.70 IU/mL    Comment: (NOTE) If heparin results are below expected values, and patient dosage has  been confirmed, suggest follow up testing of antithrombin III levels. Performed at [facility], [place] [address] [place], [place] [address]   CBC     Status: Abnormal   Collection Time: 04/06/19  4:35 AM  Result Value Ref Range   WBC 10.9 (H) 4.0 - 10.5 K/uL   RBC 2.75 (L) 4.22 - 5.81 MIL/uL   Hemoglobin 8.6 (L) 13.0 - 17.0 g/dL   HCT 26.3 (L) 39.0 - 52.0 %   MCV 95.6 80.0 - 100.0 fL   MCH 31.3 26.0 - 34.0 pg   MCHC 32.7 30.0 - 36.0 g/dL   RDW 14.8 11.5 - 15.5 %   Platelets 321 150 - 400 K/uL   nRBC 0.0 0.0 - 0.2 %    Comment: Performed at [facility], [place] [address] [place], [place] [address]  Renal function panel     Status: Abnormal   Collection Time: 04/06/19  4:35 AM  Result Value Ref Range   Sodium 137 135 - 145 mmol/L   Potassium 3.3 (L) 3.5 - 5.1 mmol/L   Chloride 96 (L) 98 - 111 mmol/L   CO2 26 22 - 32 mmol/L    Glucose, Bld 103 (H) 70 - 99 mg/dL   BUN 95 (H) 6 - 20 mg/dL   Creatinine, Ser 5.17 (H) 0.61 - 1.24 mg/dL   Calcium 10.9 (H) 8.9 - 10.3 mg/dL   Phosphorus 7.6 (H) 2.5 - 4.6 mg/dL   Albumin 2.9 (L) 3.5 - 5.0 g/dL   GFR calc non Af Amer 12 (L) >60 mL/min   GFR calc Af Amer 14 (L) >60 mL/min   Anion gap 15 5 - 15    Comment: Performed at [facility] [address], [place], [place] [address]  Glucose, capillary     Status: Abnormal   Collection Time: 04/06/19  4:45 AM  Result Value Ref Range   Glucose-Capillary 120 (H) 70 - 99 mg/dL  Glucose, capillary     Status: Abnormal   Collection Time: 04/06/19  7:51 AM  Result Value Ref Range   Glucose-Capillary 116 (H) 70 - 99 mg/dL  Glucose, capillary     Status: Abnormal   Collection Time: 04/06/19 11:38 AM  Result Value Ref Range   Glucose-Capillary 200 (H) 70 - 99 mg/dL  Glucose, capillary     Status:  Abnormal   Collection Time: 04/06/19  4:48 PM  Result Value Ref Range   Glucose-Capillary 163 (H) 70 - 99 mg/dL  Glucose, capillary     Status: Abnormal   Collection Time: 04/06/19  7:51 PM  Result Value Ref Range   Glucose-Capillary 201 (H) 70 - 99 mg/dL  Glucose, capillary     Status: Abnormal   Collection Time: 04/06/19 11:55 PM  Result Value Ref Range   Glucose-Capillary 147 (H) 70 - 99 mg/dL  Glucose, capillary     Status: Abnormal   Collection Time: 04/07/19  3:07 AM  Result Value Ref Range   Glucose-Capillary 137 (H) 70 - 99 mg/dL  Glucose, capillary     Status: None   Collection Time: 04/07/19  7:56 AM  Result Value Ref Range   Glucose-Capillary 98 70 - 99 mg/dL  CBC     Status: Abnormal   Collection Time: 04/07/19  9:39 AM  Result Value Ref Range   WBC 8.0 4.0 - 10.5 K/uL   RBC 3.10 (L) 4.22 - 5.81 MIL/uL   Hemoglobin 9.0 (L) 13.0 - 17.0 g/dL   HCT 28.4 (L) 39.0 - 52.0 %   MCV 91.6 80.0 - 100.0 fL   MCH 29.0 26.0 - 34.0 pg   MCHC 31.7 30.0 - 36.0 g/dL   RDW 14.5 11.5 - 15.5 %   Platelets 311 150 - 400 K/uL    nRBC 0.0 0.0 - 0.2 %    Comment: Performed at [facility], [place] [address], [place], [place] [address]  Renal function panel     Status: Abnormal   Collection Time: 04/07/19  9:39 AM  Result Value Ref Range   Sodium 134 (L) 135 - 145 mmol/L   Potassium 2.8 (L) 3.5 - 5.1 mmol/L   Chloride 94 (L) 98 - 111 mmol/L   CO2 26 22 - 32 mmol/L   Glucose, Bld 221 (H) 70 - 99 mg/dL   BUN 99 (H) 6 - 20 mg/dL   Creatinine, Ser 4.25 (H) 0.61 - 1.24 mg/dL   Calcium 11.2 (H) 8.9 - 10.3 mg/dL   Phosphorus 6.5 (H) 2.5 - 4.6 mg/dL   Albumin 3.0 (L) 3.5 - 5.0 g/dL   GFR calc non Af Amer 15 (L) >60 mL/min   GFR calc Af Amer 18 (L) >60 mL/min   Anion gap 14 5 - 15    Comment: Performed at [facility] [address], [place], [place] [address]  Glucose, capillary     Status: Abnormal   Collection Time: 04/07/19 12:20 PM  Result Value Ref Range   Glucose-Capillary 158 (H) 70 - 99 mg/dL  Glucose, capillary     Status: Abnormal   Collection Time: 04/07/19  3:58 PM  Result Value Ref Range   Glucose-Capillary 173 (H) 70 - 99 mg/dL  Low molecular wgt heparin (fractionated)     Status: None   Collection Time: 04/07/19  4:28 PM  Result Value Ref Range   Heparin LMW 1.00 IU/mL    Comment:        THERAPEUTIC RANGE: DVT,PE,ACS on LMWH 1 mg/kg q12 at 4 hrs = 0.5-1.2 units/mL. DVT,PE on LMWH 1.5 mg/kg q24 at 4 hrs = 1-2 units/mL. Performed at [facility], [place] [address], [place], [place] [address]   Glucose, capillary     Status: Abnormal   Collection Time: 04/07/19  7:39 PM  Result Value Ref Range   Glucose-Capillary 160 (H) 70 - 99  mg/dL    No results found.  ROS Blood pressure 138/82, pulse (!) 114, temperature 98.2 F (36.8 C), temperature source Oral, resp. rate (!) 28, height 5\' 5"  (1.651 m), weight 62.5 kg, SpO2 96 %. Physical Exam  HENT:  Head: Normocephalic.  Patient awake and alert. He has a 1.5 cm wound on the left cheek that was bleeding from the upper lateral  corner. I placed pressure on this location and bleeding immediately stopped. The wound has scarring around the edge. The nurse held pressure in this precise location and the bleeding stopped after 5 minutes. The bleeding did not restart with manipulation. It was injected and hemastat attempted to grap any arterial stump in the location of the previous bleeding  but was not successful. No bleeding.   Eyes: Pupils are equal, round, and reactive to light.    Assessment/Plan: Bleeding facial wound- the nurse was shown where the bleeding was located and while I was gone getting supplies from OR and ER it stopped. I injected it with 1% lido and epi. I could not get it to bleed again. Call if further bleeding. If not healed in 1 week need biopsy of wound.   Melissa Montane 04/07/2019, 11:40 PM

## 2019-04-08 LAB — GLUCOSE, CAPILLARY
Glucose-Capillary: 119 mg/dL — ABNORMAL HIGH (ref 70–99)
Glucose-Capillary: 194 mg/dL — ABNORMAL HIGH (ref 70–99)
Glucose-Capillary: 219 mg/dL — ABNORMAL HIGH (ref 70–99)
Glucose-Capillary: 229 mg/dL — ABNORMAL HIGH (ref 70–99)
Glucose-Capillary: 245 mg/dL — ABNORMAL HIGH (ref 70–99)
Glucose-Capillary: 272 mg/dL — ABNORMAL HIGH (ref 70–99)

## 2019-04-08 LAB — CBC
HCT: 26.4 % — ABNORMAL LOW (ref 39.0–52.0)
Hemoglobin: 8.4 g/dL — ABNORMAL LOW (ref 13.0–17.0)
MCH: 29.6 pg (ref 26.0–34.0)
MCHC: 31.8 g/dL (ref 30.0–36.0)
MCV: 93 fL (ref 80.0–100.0)
Platelets: 351 10*3/uL (ref 150–400)
RBC: 2.84 MIL/uL — ABNORMAL LOW (ref 4.22–5.81)
RDW: 14.7 % (ref 11.5–15.5)
WBC: 18.8 10*3/uL — ABNORMAL HIGH (ref 4.0–10.5)
nRBC: 0 % (ref 0.0–0.2)

## 2019-04-08 LAB — RENAL FUNCTION PANEL
Albumin: 2.9 g/dL — ABNORMAL LOW (ref 3.5–5.0)
Anion gap: 14 (ref 5–15)
BUN: 106 mg/dL — ABNORMAL HIGH (ref 6–20)
CO2: 24 mmol/L (ref 22–32)
Calcium: 11.2 mg/dL — ABNORMAL HIGH (ref 8.9–10.3)
Chloride: 98 mmol/L (ref 98–111)
Creatinine, Ser: 3.78 mg/dL — ABNORMAL HIGH (ref 0.61–1.24)
GFR calc Af Amer: 21 mL/min — ABNORMAL LOW (ref >60)
GFR calc non Af Amer: 18 mL/min — ABNORMAL LOW (ref >60)
Glucose, Bld: 208 mg/dL — ABNORMAL HIGH (ref 70–99)
Phosphorus: 4.7 mg/dL — ABNORMAL HIGH (ref 2.5–4.6)
Potassium: 3.8 mmol/L (ref 3.5–5.1)
Sodium: 136 mmol/L (ref 135–145)

## 2019-04-08 LAB — PROTIME-INR
INR: 1.1 (ref 0.8–1.2)
Prothrombin Time: 14.5 seconds (ref 11.4–15.2)

## 2019-04-08 MED ORDER — INSULIN ASPART 100 UNIT/ML ~~LOC~~ SOLN
6.0000 [IU] | Freq: Three times a day (TID) | SUBCUTANEOUS | Status: DC
  Administered 2019-04-08 – 2019-04-19 (×24): 6 [IU] via SUBCUTANEOUS
  Administered 2019-04-19: 100 [IU] via SUBCUTANEOUS

## 2019-04-08 MED ORDER — ENOXAPARIN SODIUM 60 MG/0.6ML ~~LOC~~ SOLN
60.0000 mg | SUBCUTANEOUS | Status: DC
  Administered 2019-04-08 – 2019-04-11 (×4): 60 mg via SUBCUTANEOUS
  Filled 2019-04-08 (×4): qty 0.6

## 2019-04-08 MED ORDER — SODIUM CHLORIDE 0.9 % IV SOLN
1000.0000 mL | Freq: Once | INTRAVENOUS | Status: AC
  Administered 2019-04-08: 1000 mL via INTRAVENOUS

## 2019-04-08 MED ORDER — INSULIN GLARGINE 100 UNIT/ML ~~LOC~~ SOLN
12.0000 [IU] | Freq: Two times a day (BID) | SUBCUTANEOUS | Status: DC
  Administered 2019-04-08 – 2019-04-19 (×22): 12 [IU] via SUBCUTANEOUS
  Filled 2019-04-08 (×23): qty 0.12

## 2019-04-08 MED ORDER — INSULIN ASPART 100 UNIT/ML ~~LOC~~ SOLN
0.0000 [IU] | Freq: Three times a day (TID) | SUBCUTANEOUS | Status: DC
  Administered 2019-04-09: 3 [IU] via SUBCUTANEOUS
  Administered 2019-04-09 – 2019-04-10 (×3): 2 [IU] via SUBCUTANEOUS
  Administered 2019-04-10: 5 [IU] via SUBCUTANEOUS
  Administered 2019-04-10 – 2019-04-11 (×2): 1 [IU] via SUBCUTANEOUS
  Administered 2019-04-11 (×2): 2 [IU] via SUBCUTANEOUS
  Administered 2019-04-12: 5 [IU] via SUBCUTANEOUS
  Administered 2019-04-14 (×2): 1 [IU] via SUBCUTANEOUS
  Administered 2019-04-15 – 2019-04-17 (×3): 2 [IU] via SUBCUTANEOUS
  Administered 2019-04-17 (×2): 1 [IU] via SUBCUTANEOUS
  Administered 2019-04-18: 2 [IU] via SUBCUTANEOUS
  Administered 2019-04-18 – 2019-04-19 (×3): 1 [IU] via SUBCUTANEOUS
  Administered 2019-04-19: 2 [IU] via SUBCUTANEOUS

## 2019-04-08 NOTE — Progress Notes (Addendum)
[place] for Lovenox Indication: suspected PE  Patient Measurements: Height: 5\' 5"  (165.1 cm) Weight: 137 lb 12.6 oz (62.5 kg) IBW/kg (Calculated) : 61.5  Vital Signs: Temp: 98 F (36.7 C) (06/04 1121) Temp Source: Axillary (06/04 1121) BP: 114/87 (06/04 1501) Pulse Rate: 115 (06/04 1501)  Labs: Recent Labs    04/06/19 0435 04/07/19 0939 04/07/19 1628 04/07/19 2358  HGB 8.6* 9.0*  --  8.4*  HCT 26.3* 28.4*  --  26.4*  PLT 321 311  --  351  LABPROT  --   --   --  14.5  INR  --   --   --  1.1  HEPARINUNFRC 0.33  --   --   --   HEPRLOWMOCWT  --   --  1.00  --   CREATININE 5.17* 4.25*  --  3.78*   Estimated Creatinine Clearance: 20.8 mL/min (A) (by C-G formula based on SCr of 3.78 mg/dL (H)).  Medical History: No past medical history on file.  Assessment: 26 YOM admitted on 02/23/2019 with ARDS due to COVID-19 who was on IV heparin for an extended period of time and transitioned to apixaban on 5/23 for r/o PE. Patient then experienced a drop in Hgb on 5/24 requiring 2u PRBCs. Apixaban was stopped but with Hgb remaining stable with no s/sx of overt bleeding, IV heparin resumed 5/25. Patient fell in room the evening of 5/26, and heparin stopped at 2300. CT head negative; thus heparin resumed 5/27 ~ 0200. On 5/30, IV heparin transitioned to SQ Lovenox. Pharmacy consulted for dosing. Note, patient developed AKI requiring CRRT, then IHD.    Significant event: 6/3:   Anti-Xa level = 1 unit/mL, therapeutic   Patient had an episode of bleeding from the pressure ulcer on his face which needed epi and lidocaine by ENT. Lovenox was stopped and protamine given   Today, 04/08/19:  SCr 3.78 (on 6/3), now trending down, and patient starting to have an increase in UOP. Per nephrology, holding on further HD for now.  CBC: 9 > 8.4. Pltc WNL (on 6/3)  No further bleeding today, so lovenox being resumed per MD orders   Unclear why daily labs were  not drawn this AM  Goal of Therapy:  Anti-Xa level 0.6-1 units/ml 4hrs after LMWH dose given Monitor platelets by anticoagulation protocol: Yes    Plan:  Resume Lovenox 1 mg/kg (60mg ) SQ q24h for CrCl < 30 ml/min (anti-Xa level at goal yesterday)   Monitor CBC, renal function, and for s/sx of bleeding   Lindell Spar, PharmD, BCPS Clinical Pharmacist Please check AMION for all [place] numbers 04/08/2019 5:03 PM

## 2019-04-08 NOTE — Progress Notes (Addendum)
Occupational Therapy Treatment Patient Details Name: Jonathan [name] MRN: [id number] DOB: [birth year] Today's Date: 04/08/2019    History of present illness 48 y.o. male admitted on 02/23/19 with AMS, O2 sats in the ED were 50%.  Pt dx with COVID 19 and resultant ARDS.  He was intubated 4/21-5/11. Reintubated and trached 5/12. Off vent and on Tbar since 5/21.  His course is complicated by acute kidney injury requiring CVVHD.  Pt transferred back to [facility] on 5/26 and changed to intermittent HD. Pt tested Covid negative 5/29 and 5/30.    OT comments  Pt presents supine in bed agreeable to therapy session. Pt somewhat limited today in activity tolerance due to c/o dizziness. BP taken and stable but soft compared to previous Bps. HR also noted to be sustaining in the 120s/130s with seated and standing activity. O2 sats maintaining >90% on supplemental O2 (8L, 40% trach collar). Pt requiring minA (+2) for room level mobility using rollator, minA for seated and standing grooming ADL. Feel POC remains appropriate at this time. Will continue to follow acutely. [employer] interpreter used [place], [place].   Follow Up Recommendations  CIR    Equipment Recommendations  3 in 1 bedside commode          Precautions / Restrictions Precautions Precautions: Fall Precaution Comments: R IJ HD catheter, trach Restrictions Weight Bearing Restrictions: No       Mobility Bed Mobility Overal bed mobility: Needs Assistance Bed Mobility: Supine to Sit     Supine to sit: HOB elevated;Supervision     General bed mobility comments: Assist for safety and lines  Transfers Overall transfer level: Needs assistance Equipment used: 4-wheeled walker Transfers: Sit to/from Stand Sit to Stand: Min assist;+2 safety/equipment         General transfer comment: Assist to bring hips up and for balance.     Balance Overall balance assessment: Needs assistance Sitting-balance support: No upper extremity  supported;Feet supported Sitting balance-Leahy Scale: Fair     Standing balance support: Bilateral upper extremity supported Standing balance-Leahy Scale: Poor Standing balance comment: walker and min assist for static standing                           ADL either performed or assessed with clinical judgement   ADL Overall ADL's : Needs assistance/impaired     Grooming: Minimal assistance;Standing;Sitting;Brushing hair Grooming Details (indicate cue type and reason): minA for standing balance, pt with decreased standing tolerance today due to dizziness; minA to support R elbow during functional tasks when seated                             Functional mobility during ADLs: Minimal assistance;+2 for physical assistance;+2 for safety/equipment;Rolling walker       Vision       Perception     Praxis      Cognition Arousal/Alertness: Awake/alert Behavior During Therapy: WFL for tasks assessed/performed Overall Cognitive Status: Difficult to assess                                 General Comments: Pt following commands. Demonstrating use of call bell. Reporting how he feels. Evonnie Dawes in place but speech very low volume        Exercises     Shoulder Instructions       General  Comments Pt with c/o dizziness today, BP slightly lower than it has been ranging - will plan to check for orthostatics next session    Pertinent Vitals/ Pain       Pain Assessment: Faces Faces Pain Scale: No hurt Pain Intervention(s): Monitored during session  Home Living                                          Prior Functioning/Environment              Frequency  Min 3X/week        Progress Toward Goals  OT Goals(current goals can now be found in the care plan section)  Progress towards OT goals: Progressing toward goals  Acute Rehab OT Goals Patient Stated Goal: less dizziness OT Goal Formulation: With patient Time  For Goal Achievement: 04/19/19 Potential to Achieve Goals: Good  Plan Discharge plan remains appropriate    Co-evaluation    PT/OT/SLP Co-Evaluation/Treatment: Yes Reason for Co-Treatment: For patient/therapist safety;To address functional/ADL transfers   OT goals addressed during session: ADL's and self-care      AM-PAC OT "6 Clicks" Daily Activity     Outcome Measure   Help from another person eating meals?: A Lot Help from another person taking care of personal grooming?: A Lot Help from another person toileting, which includes using toliet, bedpan, or urinal?: A Lot Help from another person bathing (including washing, rinsing, drying)?: A Lot Help from another person to put on and taking off regular upper body clothing?: A Lot Help from another person to put on and taking off regular lower body clothing?: A Lot 6 Click Score: 12    End of Session Equipment Utilized During Treatment: Rolling walker;Oxygen  OT Visit Diagnosis: Other abnormalities of gait and mobility (R26.89);Muscle weakness (generalized) (M62.81);Pain Pain - Right/Left: Right Pain - part of body: Shoulder   Activity Tolerance Patient tolerated treatment well   Patient Left in chair;with call bell/phone within reach;with chair alarm set   Nurse Communication Mobility status        Time: 4920-1007 OT Time Calculation (min): 43 min  Charges: OT General Charges $OT Visit: 1 Visit OT Treatments $Self Care/Home Management : 23-37 mins  Lou Cal, [place] Pager [phone] Office [phone]    Raymondo Band 04/08/2019, 5:01 PM

## 2019-04-08 NOTE — Progress Notes (Signed)
Assisted tele visit to patient with family member.  Toua Stites R, RN  

## 2019-04-08 NOTE — Progress Notes (Signed)
Inpatient Rehabilitation Admissions Coordinator  [place], interpreter, contacted pt's daughter in [place] to clarify intent of return to [place] at d/c or remain in the [place]. Family are requesting patient to remain in the [place] post d/c. I continue to follow his medical course with his AKI as well as respiratory status to assist with planning dispo to CIR vs SNF vs HH at d/c pending his functional progress. I will follow.  Danne Baxter, RN, MSN Rehab Admissions Coordinator [phone] 04/08/2019 4:24 PM

## 2019-04-08 NOTE — Progress Notes (Signed)
Renal Navigator continues to follow chart for OP HD needs. Renal Navigator notes documentation by B. [place]/CIR [facility] regarding family's request for patient to remain in the [place] post d/c (see note from 04/08/19). Renal Navigator available to arrange OP HD if this is deemed necessary as patient nears discharge/admission to CIR.  Alphonzo Cruise, [place] Renal Navigator [phone]

## 2019-04-08 NOTE — Progress Notes (Signed)
PROGRESS NOTE    Smiley Birr  ZOX:[id number] DOB: [birth year] DOA: 02/23/2019 PCP: Patient, No Pcp Per  Brief Narrative: 48 year old Spanish-speaking male from [place], was admitted on 4/21 with increased respiratory distress, he was intubated on admission for ARDS due to SARS COVID-19 infection. -Unfortunately developed severe ATN required CRRT, prolonged respiratory failure , was extubated and reintubated earlier this month, subsequently underwent tracheostomy on 5/12 , followed by placement of a permacath on 5/13 -Ventrally liberated off the ventilator 5/21 however remained anuric requiring intermittent hemodialysis subsequently transferred to [facility] 5/26 from [facility] -Was also started on anticoagulation while in ICU for suspected pulmonary embolism -5/29 passed swallow evaluation, his core track was removed and he was started on dysphagia diet with nectar thick liquids, last HD 5/30,hopeful for renal recovery -Repeat COVID tested negative x2 on 5/29 and 5/30 Events: 02/23/2019 admitted to the ICU. 02/24/2019 started on CRRT 4/21-4/24 Proned daily 4/28 -4/30 proned daily  03/04/2019 increase in oxygen demand required paralytic for 24 hours. 03/09/2019 hematochezia from gastric aspiration 5/11 extubated 5/12 re-intubated, tracheostomy 5/13 perm cath 5/14 worsening hypoxemia, shock suddenly, RV dilated, no new CXR changes, treated for PE empirically 5/21 doing t-piece trials 5/23: Trial off of CVVHD 5/24: 2 units PRBC 5/25 remains off of ventilator support since May 21, remains anuric, 5/26 patient transferred to [facility] from [facility] as he needed intermittent hemodialysis  5/29 passed modified swallow eval 5/30 started on nectar thick liquis and taking pills by mouth,dialysis  Lines and tubes: 4/21 - ETT >>5.11.2020 4/21 - RIJ CVC, removed 4/22 L IJ HD Cath> removed 5/13 Permacath right Antibiotics: 02/23/2019-425 2020 azithromycin and Rocephin 02/23/2019-02/24/2019  03/06/2019 Rocephin 5/11-5/17Vancomycin 5/12-5/18Zosyn 5/12-5/17Fluconazole for candida in tracheal aspirate and recent Actemra  I took over his care 04/07/2019.  Overnight patient had an episode of bleeding from the pressure ulcer on his face which needed epi and lidocaine by ENT.  Lovenox was stopped.  He is on Lovenox for presumed pulmonary embolism.  Significant diagnostic Tests; POC U/S 4/21: Bilateral interstitial pattern with bilateral dense consolidation at bases Echo shows low-normal LVSF. RV moderately dilated with septal shift.  5/14 Echo> LVEF > 65%, RV dilated, decreased RV function (moderate) with RVSP 60  Assessment & Plan:  Assessment & Plan:   Active Problems:   ARDS (adult respiratory distress syndrome) (HCC)   Pressure injury of skin   Acute respiratory failure with hypoxia ([place])   COVID-19   AKI (acute kidney injury) ([place])   ATN (acute tubular necrosis) ([place])   Tracheostomy care ([place])   On tube feeding diet   Status post tracheostomy ([place])   History of ETT   Encounter for intubation   Acute Hypoxic Resp. Failure due to Acute Covid 19 Viral Illness/ARDS/septic shock  -Treated at [facility] with IV steroids, IV Actemra on 4/22, -Also received convalescent plasma on 5/3 -Status post tracheostomy 5/12 -Weaned off the ventilator 5/21 -Still has moderate O2 requirement with FiO2 of 60% -Chest x-ray 5/30 showed low lung volumes and patchy airspace disease which was improving -Complicated by ATN, anuria initially was on CRRT, now on intermittent hemodialysis, hoping for renal recovery -CCM following intermittently for trach care, trach was downsized to #4 uncuffed Shiley on 5/26.  Patient remains on 8 L of oxygen saturation 94%.  Hopefully capping trial this week with early decannulation. -CIR consulted and foll  Presumed acute PE -On 5/14 patient had a sudden decline with shock, worsening hypoxemia and echocardiogram which showed right heart strain -CT angiogram  could not be obtained due to severe AKI -He was started on IV heparin for presumed PE, Dopplers were negative -3 months of anticoagulation recommended -Lovenox has been stopped due to bleeding overnight from the decub on his face which has been stitched by ENT.  No further bleeding.  Will restart Lovenox and closely monitor.  Hemoglobin is 8.4 down from 9 monitor.  Acute kidney injury due to ATN/Covid -Baseline creatinine was normal 2 months ago, he started CRRT on 4/21 and transition to intermittent hemodialysis 5/28, was undergoing HD Tuesday Thursday Saturday via TDC,mild improvement in urine output [id number]  cc last 24 hours,labs stable -Nephrology following hopefully continues tohaverenal recovery  Anemia due to critical illness- -No evidance of significant GI bleed, -Has had small to modest amount of bleeding from tracheostomy trauma/suctioning -Received 2 units PRBC 03/28/2019 -Hemoglobin remains stable -Had bleeding from the pressure ulcer on his face  Dysphagia -Insetting of subacute respiratory failure, tracheostomy -SLP recommends dysphagia 1 puree nectar thick liquid.  Hyperglycemia -No clear history of diabetes, hemoglobin A1c was 5.5 -Remains on Lantus 10 units twice daily and sliding scale insulin, CBGs are less than 170  Pressure injury on the skin of nose.bilateral cheeks Local wound care. -Appears to be healing  Bleeding facial wound one-point centimeter wound on the left cheek status post injection with 1% lidocaine and epi.  If not healed in 1 week biopsy recommended by ENT.  DVT Prophylaxis:Lovenox  PUD Prophylaxis:On famotidine/ppi Code Status:Partial code Family Communication:Wife [place] international number [phone]. Dr.Fang d/w family regularly via interpreter (Interpreter number is [phone]. If you call her she will connect you to family and interpret for you) disposition Plan:needs trachcapping trials, possible  decannulationspeech eval, PMV and diet advancement, depends on ongoing renal recovery Now a candidate for CIR repeat COVID 19 test negative x2 on 5/28 and 5/30   Consultants:Pulmonology. Nephrology.  Pressure Injury 03/03/19 Face Right Unstageable - Full thickness tissue loss in which the base of the ulcer is covered by slough (yellow, tan, gray, green or brown) and/or eschar (tan, brown or black) in the wound bed. 2cm x2 1/4 cm  red edges  with blac (Active)  03/03/19 0830  Location: Face  Location Orientation: Right  Staging: Unstageable - Full thickness tissue loss in which the base of the ulcer is covered by slough (yellow, tan, gray, green or brown) and/or eschar (tan, brown or black) in the wound bed.  Wound Description (Comments): 2cm x2 1/4 cm  red edges  with black cneter  Present on Admission: No     Pressure Injury 03/03/19 Face Left Unstageable - Full thickness tissue loss in which the base of the ulcer is covered by slough (yellow, tan, gray, green or brown) and/or eschar (tan, brown or black) in the wound bed. 1 1/2 cm x 2 1/2 cm red edges with y (Active)  03/03/19 0830  Location: Face  Location Orientation: Left  Staging: Unstageable - Full thickness tissue loss in which the base of the ulcer is covered by slough (yellow, tan, gray, green or brown) and/or eschar (tan, brown or black) in the wound bed.  Wound Description (Comments): 1 1/2 cm x 2 1/2 cm red edges with yellow center and black ring around yellow area  Present on Admission: No      Nutrition Problem: Inadequate oral intake Etiology: inability to eat     Signs/Symptoms: NPO status    Interventions: Nepro shake  Estimated body mass index is 22.93 kg/m as calculated from the following:  Height as of this encounter: 5\' 5"  (1.651 m).   Weight as of this encounter: 62.5 kg.   Subjective: Patient is resting in bed in no distress no further bleeding noted staff has no complaints or issues this  morning.  Objective: Vitals:   04/08/19 0741 04/08/19 0830 04/08/19 1108 04/08/19 1121  BP: 115/88 124/90 (!) 139/95   Pulse: (!) 107 (!) 103 (!) 115 (!) 110  Resp: (!) 23 (!) 21 (!) 21 (!) 26  Temp: 98 F (36.7 C)   98 F (36.7 C)  TempSrc: Axillary   Axillary  SpO2: 98% 98% 94%   Weight:      Height:        Intake/Output Summary (Last 24 hours) at 04/08/2019 1346 Last data filed at 04/08/2019 0847 Gross per 24 hour  Intake 440 ml  Output 1450 ml  Net -1010 ml   Filed Weights   04/05/19 0418 04/06/19 0411 04/07/19 0507  Weight: 60.8 kg 62.4 kg 62.5 kg    Examination:  General exam: Appears calm and comfortable  Respiratory system: Scattered rhonchi to auscultation. Respiratory effort normal. Cardiovascular system: S1 & S2 heard, RRR. No JVD, murmurs, rubs, gallops or clicks. No pedal edema. Gastrointestinal system: Abdomen is nondistended, soft and nontender. No organomegaly or masses felt. Normal bowel sounds heard. Central nervous system: Alert and oriented. No focal neurological deficits. Extremities: Symmetric 5 x 5 power. Skin: No rashes, lesions or ulcers Psychiatry: Judgement and insight appear normal. Mood & affect appropriate.     Data Reviewed: I have personally reviewed following labs and imaging studies  CBC: Recent Labs  Lab 04/04/19 0559 04/05/19 0555 04/06/19 0435 04/07/19 0939 04/07/19 2358  WBC 8.7 10.1 10.9* 8.0 18.8*  HGB 9.1* 9.2* 8.6* 9.0* 8.4*  HCT 28.4* 28.0* 26.3* 28.4* 26.4*  MCV 96.3 96.6 95.6 91.6 93.0  PLT 282 301 321 311 975   Basic Metabolic Panel: Recent Labs  Lab 04/04/19 0559 04/05/19 0555 04/06/19 0435 04/07/19 0939 04/07/19 2358  NA 136 136 137 134* 136  K 4.0 3.4* 3.3* 2.8* 3.8  CL 97* 97* 96* 94* 98  CO2 26 24 26 26 24   GLUCOSE 144* 138* 103* 221* 208*  BUN 66* 84* 95* 99* 106*  CREATININE 4.90* 5.41* 5.17* 4.25* 3.78*  CALCIUM 10.2 10.8* 10.9* 11.2* 11.2*  PHOS  --  8.3* 7.6* 6.5* 4.7*   GFR: Estimated  Creatinine Clearance: 20.8 mL/min (A) (by C-G formula based on SCr of 3.78 mg/dL (H)). Liver Function Tests: Recent Labs  Lab 04/05/19 0555 04/06/19 0435 04/07/19 0939 04/07/19 2358  ALBUMIN 3.1* 2.9* 3.0* 2.9*   No results for input(s): LIPASE, AMYLASE in the last 168 hours. No results for input(s): AMMONIA in the last 168 hours. Coagulation Profile: Recent Labs  Lab 04/07/19 2358  INR 1.1   Cardiac Enzymes: No results for input(s): CKTOTAL, CKMB, CKMBINDEX, TROPONINI in the last 168 hours. BNP (last 3 results) No results for input(s): PROBNP in the last 8760 hours. HbA1C: No results for input(s): HGBA1C in the last 72 hours. CBG: Recent Labs  Lab 04/07/19 1939 04/08/19 0027 04/08/19 0306 04/08/19 0740 04/08/19 1120  GLUCAP 160* 194* 219* 119* 245*   Lipid Profile: No results for input(s): CHOL, HDL, LDLCALC, TRIG, CHOLHDL, LDLDIRECT in the last 72 hours. Thyroid Function Tests: No results for input(s): TSH, T4TOTAL, FREET4, T3FREE, THYROIDAB in the last 72 hours. Anemia Panel: No results for input(s): VITAMINB12, FOLATE, FERRITIN, TIBC, IRON, RETICCTPCT in the last 72 hours.  Sepsis Labs: No results for input(s): PROCALCITON, LATICACIDVEN in the last 168 hours.  Recent Results (from the past 240 hour(s))  SARS Coronavirus 2 (CEPHEID- Performed in [place] [facility] lab), Hosp Order     Status: None   Collection Time: 04/02/19 12:40 PM  Result Value Ref Range Status   SARS Coronavirus 2 NEGATIVE NEGATIVE Final    Comment: (NOTE) If result is NEGATIVE SARS-CoV-2 target nucleic acids are NOT DETECTED. The SARS-CoV-2 RNA is generally detectable in upper and lower  respiratory specimens during the acute phase of infection. The lowest  concentration of SARS-CoV-2 viral copies this assay can detect is 250  copies / mL. A negative result does not preclude SARS-CoV-2 infection  and should not be used as the sole basis for treatment or other  patient management  decisions.  A negative result may occur with  improper specimen collection / handling, submission of specimen other  than nasopharyngeal swab, presence of viral mutation(s) within the  areas targeted by this assay, and inadequate number of viral copies  (<250 copies / mL). A negative result must be combined with clinical  observations, patient history, and epidemiological information. If result is POSITIVE SARS-CoV-2 target nucleic acids are DETECTED. The SARS-CoV-2 RNA is generally detectable in upper and lower  respiratory specimens dur ing the acute phase of infection.  Positive  results are indicative of active infection with SARS-CoV-2.  Clinical  correlation with patient history and other diagnostic information is  necessary to determine patient infection status.  Positive results do  not rule out bacterial infection or co-infection with other viruses. If result is PRESUMPTIVE POSTIVE SARS-CoV-2 nucleic acids MAY BE PRESENT.   A presumptive positive result was obtained on the submitted specimen  and confirmed on repeat testing.  While 2019 novel coronavirus  (SARS-CoV-2) nucleic acids may be present in the submitted sample  additional confirmatory testing may be necessary for epidemiological  and / or clinical management purposes  to differentiate between  SARS-CoV-2 and other Sarbecovirus currently known to infect humans.  If clinically indicated additional testing with an alternate test  methodology [phone]) is advised. The SARS-CoV-2 RNA is generally  detectable in upper and lower respiratory sp ecimens during the acute  phase of infection. The expected result is Negative. Fact Sheet for Patients:  StrictlyIdeas.no Fact Sheet for Healthcare Providers: BankingDealers.co.za This test is not yet approved or cleared by the [place] FDA and has been authorized for detection and/or diagnosis of SARS-CoV-2 by FDA under an  Emergency Use Authorization (EUA).  This EUA will remain in effect (meaning this test can be used) for the duration of the COVID-19 declaration under Section 564(b)(1) of the Act, 21 U.S.C. section 360bbb-3(b)(1), unless the authorization is terminated or revoked sooner. Performed at [facility], [place] [address], [place], [place] [address]   SARS Coronavirus 2 (CEPHEID- Performed in [place] hospital lab), Hosp Order     Status: None   Collection Time: 04/03/19 11:28 AM  Result Value Ref Range Status   SARS Coronavirus 2 NEGATIVE NEGATIVE Final    Comment: (NOTE) If result is NEGATIVE SARS-CoV-2 target nucleic acids are NOT DETECTED. The SARS-CoV-2 RNA is generally detectable in upper and lower  respiratory specimens during the acute phase of infection. The lowest  concentration of SARS-CoV-2 viral copies this assay can detect is 250  copies / mL. A negative result does not preclude SARS-CoV-2 infection  and should not be used as the sole basis for treatment  or other  patient management decisions.  A negative result may occur with  improper specimen collection / handling, submission of specimen other  than nasopharyngeal swab, presence of viral mutation(s) within the  areas targeted by this assay, and inadequate number of viral copies  (<250 copies / mL). A negative result must be combined with clinical  observations, patient history, and epidemiological information. If result is POSITIVE SARS-CoV-2 target nucleic acids are DETECTED. The SARS-CoV-2 RNA is generally detectable in upper and lower  respiratory specimens dur ing the acute phase of infection.  Positive  results are indicative of active infection with SARS-CoV-2.  Clinical  correlation with patient history and other diagnostic information is  necessary to determine patient infection status.  Positive results do  not rule out bacterial infection or co-infection with other viruses. If result is PRESUMPTIVE POSTIVE  SARS-CoV-2 nucleic acids MAY BE PRESENT.   A presumptive positive result was obtained on the submitted specimen  and confirmed on repeat testing.  While 2019 novel coronavirus  (SARS-CoV-2) nucleic acids may be present in the submitted sample  additional confirmatory testing may be necessary for epidemiological  and / or clinical management purposes  to differentiate between  SARS-CoV-2 and other Sarbecovirus currently known to infect humans.  If clinically indicated additional testing with an alternate test  methodology [phone]) is advised. The SARS-CoV-2 RNA is generally  detectable in upper and lower respiratory sp ecimens during the acute  phase of infection. The expected result is Negative. Fact Sheet for Patients:  StrictlyIdeas.no Fact Sheet for Healthcare Providers: BankingDealers.co.za This test is not yet approved or cleared by the [place] FDA and has been authorized for detection and/or diagnosis of SARS-CoV-2 by FDA under an Emergency Use Authorization (EUA).  This EUA will remain in effect (meaning this test can be used) for the duration of the COVID-19 declaration under Section 564(b)(1) of the Act, 21 U.S.C. section 360bbb-3(b)(1), unless the authorization is terminated or revoked sooner. Performed at [facility], [place] [address] [place], [place] [address]          Radiology Studies: No results found.      Scheduled Meds: . chlorhexidine  15 mL Mouth Rinse BID  . Chlorhexidine Gluconate Cloth  6 each Topical Q0600  . Chlorhexidine Gluconate Cloth  6 each Topical Q0600  . clonazepam  0.25 mg Oral Daily  . darbepoetin (ARANESP) injection - NON-DIALYSIS  100 mcg Subcutaneous Q Sat-1800  . famotidine  20 mg Oral Daily  . feeding supplement (NEPRO CARB STEADY)  237 mL Oral BID BM  . feeding supplement (PRO-STAT SUGAR FREE 64)  30 mL Oral TID  . free water  100 mL Oral Q8H  . guaiFENesin  5 mL Oral  BID  . insulin aspart  0-15 Units Subcutaneous Q4H  . insulin aspart  6 Units Subcutaneous Q4H  . insulin glargine  10 Units Subcutaneous BID  . mouth rinse  15 mL Mouth Rinse q12n4p  . mupirocin cream   Topical BID  . pantoprazole  40 mg Oral BID  . potassium chloride  40 mEq Oral BID  . senna-docusate  1 tablet Oral BID  . sodium chloride flush  10-40 mL Intracatheter Q12H   Continuous Infusions: . sodium chloride    . sodium chloride Stopped (03/21/19 2128)     LOS: 26 days     Georgette Shell, MD [facility]  If 7PM-7AM, please contact night-coverage [email or web address] Password [email or web address] 04/08/2019, 1:46 PM

## 2019-04-08 NOTE — Progress Notes (Signed)
Patient ID: Arhum Peeples, male   DOB: 23-Dec-1970, 48 y.o.   MRN: [id number] S: 1400  of urine recorded- crt down- BUN stable- k is god    O:BP 115/88 (BP Location: Left Arm)   Pulse (!) 107   Temp 98 F (36.7 C) (Axillary)   Resp (!) 23   Ht 5\' 5"  (1.651 m)   Wt 62.5 kg   SpO2 98%   BMI 22.93 kg/m   Intake/Output Summary (Last 24 hours) at 04/08/2019 0851 Last data filed at 04/08/2019 0847 Gross per 24 hour  Intake 440 ml  Output 1450 ml  Net -1010 ml   Intake/Output: I/O last 3 completed shifts: In: 200 [NG/GT:200] Out: 2450 [Urine:2450]  Intake/Output this shift:  Total I/O In: 240 [P.O.:240] Out: -  Weight change:   gen-  Alert, trach- spanish speaking- some coughing Lungs- CBS bilat  abd- soft, non tender Ext no peripheral edema    Recent Labs  Lab 04/02/19 0707 04/03/19 0531 04/04/19 0559 04/05/19 0555 04/06/19 0435 04/07/19 0939 04/07/19 2358  NA 134* 136 136 136 137 134* 136  K 4.0 4.2 4.0 3.4* 3.3* 2.8* 3.8  CL 95* 95* 97* 97* 96* 94* 98  CO2 26 24 26 24 26 26 24   GLUCOSE 133* 113* 144* 138* 103* 221* 208*  BUN 82* 106* 66* 84* 95* 99* 106*  CREATININE 5.73* 6.53* 4.90* 5.41* 5.17* 4.25* 3.78*  ALBUMIN  --   --   --  3.1* 2.9* 3.0* 2.9*  CALCIUM 12.2* 13.1* 10.2 10.8* 10.9* 11.2* 11.2*  PHOS  --   --   --  8.3* 7.6* 6.5* 4.7*   Liver Function Tests: Recent Labs  Lab 04/06/19 0435 04/07/19 0939 04/07/19 2358  ALBUMIN 2.9* 3.0* 2.9*   No results for input(s): LIPASE, AMYLASE in the last 168 hours. No results for input(s): AMMONIA in the last 168 hours. CBC: Recent Labs  Lab 04/04/19 0559 04/05/19 0555 04/06/19 0435 04/07/19 0939 04/07/19 2358  WBC 8.7 10.1 10.9* 8.0 18.8*  HGB 9.1* 9.2* 8.6* 9.0* 8.4*  HCT 28.4* 28.0* 26.3* 28.4* 26.4*  MCV 96.3 96.6 95.6 91.6 93.0  PLT 282 301 321 311 351   Cardiac Enzymes: No results for input(s): CKTOTAL, CKMB, CKMBINDEX, TROPONINI in the last 168 hours. CBG: Recent Labs  Lab  04/07/19 1558 04/07/19 1939 04/08/19 0027 04/08/19 0306 04/08/19 0740  GLUCAP 173* 160* 194* 219* 119*    Iron Studies: No results for input(s): IRON, TIBC, TRANSFERRIN, FERRITIN in the last 72 hours. Studies/Results: No results found. . chlorhexidine  15 mL Mouth Rinse BID  . Chlorhexidine Gluconate Cloth  6 each Topical Q0600  . Chlorhexidine Gluconate Cloth  6 each Topical Q0600  . clonazepam  0.25 mg Oral Daily  . darbepoetin (ARANESP) injection - NON-DIALYSIS  100 mcg Subcutaneous Q Sat-1800  . famotidine  20 mg Oral Daily  . feeding supplement (NEPRO CARB STEADY)  237 mL Oral BID BM  . feeding supplement (PRO-STAT SUGAR FREE 64)  30 mL Oral TID  . free water  100 mL Oral Q8H  . furosemide  40 mg Oral BID  . guaiFENesin  5 mL Oral BID  . insulin aspart  0-15 Units Subcutaneous Q4H  . insulin aspart  6 Units Subcutaneous Q4H  . insulin glargine  10 Units Subcutaneous BID  . mouth rinse  15 mL Mouth Rinse q12n4p  . mupirocin cream   Topical BID  . pantoprazole  40 mg Oral BID  .  potassium chloride  40 mEq Oral BID  . senna-docusate  1 tablet Oral BID  . sodium chloride flush  10-40 mL Intracatheter Q12H    BMET    Component Value Date/Time   NA 136 04/07/2019 2358   K 3.8 04/07/2019 2358   CL 98 04/07/2019 2358   CO2 24 04/07/2019 2358   GLUCOSE 208 (H) 04/07/2019 2358   BUN 106 (H) 04/07/2019 2358   CREATININE 3.78 (H) 04/07/2019 2358   CALCIUM 11.2 (H) 04/07/2019 2358   GFRNONAA 18 (L) 04/07/2019 2358   GFRAA 21 (L) 04/07/2019 2358   CBC    Component Value Date/Time   WBC 18.8 (H) 04/07/2019 2358   RBC 2.84 (L) 04/07/2019 2358   HGB 8.4 (L) 04/07/2019 2358   HCT 26.4 (L) 04/07/2019 2358   PLT 351 04/07/2019 2358   MCV 93.0 04/07/2019 2358   MCH 29.6 04/07/2019 2358   MCHC 31.8 04/07/2019 2358   RDW 14.7 04/07/2019 2358   LYMPHSABS 3.1 03/14/2019 1206   MONOABS 2.2 (H) 03/14/2019 1206   EOSABS 0.3 03/14/2019 1206   BASOSABS 0.2 (H) 03/14/2019 1206    Brief HPI: Pt is a 48 y.o. yo male who was admitted on 02/23/2019 with anuric AKI due to ATN in the setting of COVID   Assessment/Plan:  1. AKI in setting of covid-19 infection.  Crt 0.95 2 mos ago. Started on CVVHD on 02/23/19 and transitioned to IHD on 04/01/19- doing HD TTS via TDC.  starting to have an increase in UOP.  Last HD Sat and kept even. Initial crt rise but now went down now 3 days in a row- BUN lagging behind which is not unusual !  Will continue to hold on HD and follow - maybe a little dry ?  Will keep [place] in til BUN start trending down  2. Covid-  Improving- now with 2 negative tests- out of isolation 3. Acute hypoxic respiratory failure s/p trach and off of vent since 03/25/19- still is an issue  4. Anemia- due to acute illness. S/p transfusion on 03/28/19 and started on Aranesp 03/27/19- stable  5. Hypercalcemia-   Possibly due to immobilization. Phos was up but getting better , no binder  6. Dispo- difficult, no insurance.  Have not started CLIP process as do not know dispo and hoping that he may recover renal function ?? 6. HTN/volume-  - on no BP meds - BP OK- some tachy- will stop lasix and give a little fluid back 7. Hypokalemia- is better, will get more today oral  [name]

## 2019-04-08 NOTE — Progress Notes (Signed)
Inpatient Diabetes Program Recommendations  AACE/ADA: New Consensus Statement on Inpatient Glycemic Control (2015)  Target Ranges:  Prepandial:   less than 140 mg/dL      Peak postprandial:   less than 180 mg/dL (1-2 hours)      Critically ill patients:  140 - 180 mg/dL   Lab Results  Component Value Date   GLUCAP 245 (H) 04/08/2019   HGBA1C 5.5 03/23/2019    Review of Glycemic Control Results for EGE, MUCKEY (MRN [id number]) as of 04/08/2019 14:05  Ref. Range 04/07/2019 19:39 04/08/2019 00:27 04/08/2019 03:06 04/08/2019 07:40 04/08/2019 11:20  Glucose-Capillary Latest Ref Range: 70 - 99 mg/dL 160 (H) 194 (H) 219 (H) 119 (H) 245 (H)   Diabetes history: None Current orders for Inpatient glycemic control:  Novolog 6 units q 4 hours, Novolog moderate q 4 hours, Lantus 10 units bid  Inpatient Diabetes Program Recommendations:    Note that patient is not on tube feeds and is on Dysphagia 1 diet.  Still requiring insulin even though no previous hx. Of DM.   Please change Novolog meal coverage to 6 units tid with meals, reduce Novolog correction to tid with meals and HS scale, and increase Lantus to 12 units bid.  Sent secure chat.   Thanks,  Adah Perl, RN, BC-ADM Inpatient Diabetes Coordinator Pager [phone] (8a-5p)

## 2019-04-08 NOTE — Progress Notes (Signed)
Physical Therapy Treatment Patient Details Name: Jonathan [name] MRN: [id number] DOB: 1971/09/29 Today's Date: 04/08/2019    History of Present Illness 48 y.o. male admitted on 02/23/19 with AMS, O2 sats in the ED were 50%.  Pt dx with COVID 19 and resultant ARDS.  He was intubated 4/21-5/11. Reintubated and trached 5/12. Off vent and on Tbar since 5/21.  His course is complicated by acute kidney injury requiring CVVHD.  Pt transferred back to [facility] on 5/26 and changed to intermittent HD. Pt tested Covid negative 5/29 and 5/30.     PT Comments    Pt limited today by dizziness with amb and by fatigue after reporting little rest last night when face was bleeding. Pt's SpO2 was excellent with no reading less than 94%. HR was sustained at 130 with activity. BP after returning to chair was 108/66 where as it had been 130's/90 earlier in the day so pt may have been orthostatic with ambulation.    Follow Up Recommendations  CIR     Equipment Recommendations  3in1 (PT);Other (comment)(rollator)    Recommendations for Other Services       Precautions / Restrictions Precautions Precautions: Fall Precaution Comments: R IJ HD catheter, trach    Mobility  Bed Mobility Overal bed mobility: Needs Assistance Bed Mobility: Supine to Sit     Supine to sit: HOB elevated;Supervision     General bed mobility comments: Assist for safety and lines  Transfers Overall transfer level: Needs assistance Equipment used: 4-wheeled walker Transfers: Sit to/from Stand Sit to Stand: Min assist;+2 safety/equipment         General transfer comment: Assist to bring hips up and for balance.   Ambulation/Gait Ambulation/Gait assistance: Min assist;+2 safety/equipment Gait Distance (Feet): 10 Feet(x 2) Assistive device: 4-wheeled walker Gait Pattern/deviations: Step-through pattern;Decreased step length - right;Decreased step length - left;Narrow base of support Gait velocity: decr Gait  velocity interpretation: <1.31 ft/sec, indicative of household ambulator General Gait Details: Assist for balance and support. Pt reports significant dizziness which limited distance. Pt on 40% trach collar with SpO2 >93% throughout. HR sustained at 130 throughout activity.   Stairs             Wheelchair Mobility    Modified Rankin (Stroke Patients Only)       Balance Overall balance assessment: Needs assistance Sitting-balance support: No upper extremity supported;Feet supported Sitting balance-Leahy Scale: Fair     Standing balance support: Bilateral upper extremity supported Standing balance-Leahy Scale: Poor Standing balance comment: walker and min assist for static standing                            Cognition Arousal/Alertness: Awake/alert Behavior During Therapy: WFL for tasks assessed/performed Overall Cognitive Status: Difficult to assess                                 General Comments: Pt following commands. Demonstrating use of call bell. Reporting how he feels. Evonnie Dawes in place but speech very low volume      Exercises      General Comments        Pertinent Vitals/Pain Pain Assessment: Faces Faces Pain Scale: No hurt    Home Living                      Prior Function  PT Goals (current goals can now be found in the care plan section) Acute Rehab PT Goals Potential to Achieve Goals: Good Progress towards PT goals: Progressing toward goals    Frequency    Min 3X/week      PT Plan Current plan remains appropriate    Co-evaluation              AM-PAC PT "6 Clicks" Mobility   Outcome Measure  Help needed turning from your back to your side while in a flat bed without using bedrails?: A Little Help needed moving from lying on your back to sitting on the side of a flat bed without using bedrails?: A Little Help needed moving to and from a bed to a chair (including a wheelchair)?:  A Little Help needed standing up from a chair using your arms (e.g., wheelchair or bedside chair)?: A Little Help needed to walk in hospital room?: A Lot Help needed climbing 3-5 steps with a railing? : Total 6 Click Score: 15    End of Session Equipment Utilized During Treatment: Oxygen;Gait belt Activity Tolerance: Treatment limited secondary to medical complications (Comment)(dizziness) Patient left: in chair;with call bell/phone within reach;with chair alarm set Nurse Communication: Mobility status PT Visit Diagnosis: Muscle weakness (generalized) (M62.81);Difficulty in walking, not elsewhere classified (R26.2)     Time: 3300-7622 PT Time Calculation (min) (ACUTE ONLY): 43 min  Charges:  $Gait Training: 8-22 mins                     [place] Pager [phone] Office [place] 04/08/2019, 2:40 PM

## 2019-04-09 LAB — RENAL FUNCTION PANEL
Albumin: 2.8 g/dL — ABNORMAL LOW (ref 3.5–5.0)
Anion gap: 9 (ref 5–15)
BUN: 97 mg/dL — ABNORMAL HIGH (ref 6–20)
CO2: 24 mmol/L (ref 22–32)
Calcium: 10.5 mg/dL — ABNORMAL HIGH (ref 8.9–10.3)
Chloride: 106 mmol/L (ref 98–111)
Creatinine, Ser: 3.02 mg/dL — ABNORMAL HIGH (ref 0.61–1.24)
GFR calc Af Amer: 27 mL/min — ABNORMAL LOW (ref >60)
GFR calc non Af Amer: 23 mL/min — ABNORMAL LOW (ref >60)
Glucose, Bld: 131 mg/dL — ABNORMAL HIGH (ref 70–99)
Phosphorus: 3.3 mg/dL (ref 2.5–4.6)
Potassium: 4 mmol/L (ref 3.5–5.1)
Sodium: 139 mmol/L (ref 135–145)

## 2019-04-09 LAB — PREPARE RBC (CROSSMATCH)

## 2019-04-09 LAB — CBC
HCT: 21.6 % — ABNORMAL LOW (ref 39.0–52.0)
Hemoglobin: 6.7 g/dL — CL (ref 13.0–17.0)
MCH: 29.8 pg (ref 26.0–34.0)
MCHC: 31 g/dL (ref 30.0–36.0)
MCV: 96 fL (ref 80.0–100.0)
Platelets: 307 10*3/uL (ref 150–400)
RBC: 2.25 MIL/uL — ABNORMAL LOW (ref 4.22–5.81)
RDW: 14.7 % (ref 11.5–15.5)
WBC: 12 10*3/uL — ABNORMAL HIGH (ref 4.0–10.5)
nRBC: 0 % (ref 0.0–0.2)

## 2019-04-09 LAB — GLUCOSE, CAPILLARY
Glucose-Capillary: 166 mg/dL — ABNORMAL HIGH (ref 70–99)
Glucose-Capillary: 202 mg/dL — ABNORMAL HIGH (ref 70–99)
Glucose-Capillary: 165 mg/dL — ABNORMAL HIGH (ref 70–99)
Glucose-Capillary: 206 mg/dL — ABNORMAL HIGH (ref 70–99)
Glucose-Capillary: 113 mg/dL — ABNORMAL HIGH (ref 70–99)

## 2019-04-09 MED ORDER — SODIUM CHLORIDE 0.9% IV SOLUTION
Freq: Once | INTRAVENOUS | Status: AC
  Administered 2019-04-09: 12:00:00 via INTRAVENOUS

## 2019-04-09 NOTE — Progress Notes (Signed)
Pt is aware that his belongings including a wallet and phone are with [name] at [place]. Pt requested a visit from Social Work.   [name], [place]  Acute Rehabilitation Services Pager [phone] Office [phone]

## 2019-04-09 NOTE — Progress Notes (Signed)
PROGRESS NOTE    Jonathan [name]  LXB:[id number] DOB: 1971-03-07 DOA: 02/23/2019 PCP: Patient, No Pcp Per  Brief Narrative:  48 year old Spanish-speaking male from [place], was admitted on 4/21 with increased respiratory distress, he was intubated on admission for ARDS due to SARS COVID-19 infection. -Unfortunately developed severe ATN required CRRT, prolonged respiratory failure , was extubated and reintubated earlier this month, subsequently underwent tracheostomy on 5/12 , followed by placement of a permacath on 5/13 -Ventrally liberated off the ventilator 5/21 however remained anuric requiring intermittent hemodialysis subsequently transferred to [facility] 5/26 from [facility]. -Was also started on anticoagulation while in ICU for suspected pulmonary embolism -5/29 passed swallow evaluation, his core track was removed and he was started on dysphagia diet with nectar thick liquids, last HD 5/30,hopeful for renal recovery -Repeat COVID tested negative x2 on 5/29 and 5/30 Events: 02/23/2019 admitted to the ICU. 02/24/2019 started on CRRT 4/21-4/24 Proned daily 4/28 -4/30 proned daily  03/04/2019 increase in oxygen demand required paralytic for 24 hours. 03/09/2019 hematochezia from gastric aspiration 5/11 extubated 5/12 re-intubated, tracheostomy 5/13 perm cath 5/14 worsening hypoxemia, shock suddenly, RV dilated, no new CXR changes, treated for PE empirically 5/21 doing t-piece trials 5/23: Trial off of CVVHD 5/24: 2 units PRBC 5/25 remains off of ventilator support since May 21, remains anuric, 5/26 patient transferred to [facility] from [facility] as he needed intermittent hemodialysis  5/29 passed modified swallow eval 5/30 started on nectar thick liquis and taking pills by mouth,dialysis  Lines and tubes: 4/21 - ETT >>5.11.2020 4/21 - RIJ CVC, removed 4/22 L IJ HD Cath> removed 5/13 Permacath right Antibiotics: 02/23/2019-425 2020 azithromycin and Rocephin 02/23/2019-02/24/2019  03/06/2019 Rocephin 5/11-5/17Vancomycin 5/12-5/18Zosyn 5/12-5/17Fluconazole for candida in tracheal aspirate and recent Actemra  I took over his care 04/07/2019.  Overnight patient had an episode of bleeding from the pressure ulcer on his face which needed epi and lidocaine by ENT.  Lovenox was stopped.  He is on Lovenox for presumed pulmonary embolism.  Significant diagnostic Tests; POC U/S 4/21: Bilateral interstitial pattern with bilateral dense consolidation at bases Echo shows low-normal LVSF. RV moderately dilated with septal shift.  5/14 Echo> LVEF > 65%, RV dilated, decreased RV function (moderate) with RVSP 60    Assessment & Plan:   Active Problems:   ARDS (adult respiratory distress syndrome) (HCC)   Pressure injury of skin   Acute respiratory failure with hypoxia ([place])   COVID-19   AKI (acute kidney injury) ([place])   ATN (acute tubular necrosis) ([place])   Tracheostomy care ([place])   On tube feeding diet   Status post tracheostomy ([place])   History of ETT   Encounter for intubation  Acute Hypoxic Resp. Failure due to Acute Covid 19 Viral Illness/ARDS/septic shock  -Treated at [facility] with IV steroids, IV Actemra on 4/22, -Also received convalescent plasma on 5/3 -Status post tracheostomy 5/12 -Weaned off the ventilator 5/21 -Still has moderate O2 requirement with FiO2 of 60% -Chest x-ray 5/30 showed low lung volumes and patchy airspace disease which was improving -Complicated by ATN, anuria initially was on CRRT, now on intermittent hemodialysis, hoping for renal recovery -CCM following intermittently for trach care, trach was downsized to #4 uncuffed [place] on 5/26.  Patient remains on 8 L/35% FiO2 of oxygen saturation 94%.  Hopefully capping trial this week with early decannulation. -CIRconsulted and foll  Presumed acute PE -On 5/14 patient had a sudden decline with shock, worsening hypoxemia and echocardiogram which showed right heart strain -CT angiogram could not  be  obtained due to severe AKI -He was started on IV heparin for presumed PE, Dopplers were negative -3 months of anticoagulation recommended -Lovenox has been restarted 04/08/2019 since he did not have any further active bleeding.   Acute kidney injury due to ATN/Covid -Baseline creatinine was normal 2 months ago, he started CRRT on 4/21 and transition to intermittent hemodialysis 5/28, was undergoing HD Tuesday Thursday Saturday via TDC,mild improvement in urine output noted1450  cc last 24 hours,labs improving creatinine 3.02 potassium 4.0 BUN 97. -Nephrology following hopefully continues tohaverenal recovery  Anemia due to critical illness- -No evidance of significant GI bleed, -Has had small to modest amount of bleeding from tracheostomy trauma/suctioning -Received 2 units PRBC 03/28/2019 -Hemoglobin dropped to 6.7 today will transfuse 1 unit.  No further bleeding from the pressure ulcer on his face.  Dysphagia -Insetting of subacute respiratory failure, tracheostomy -SLPrecommends dysphagia 1 puree nectar thick liquid.  Hyperglycemia -No clear history of diabetes, hemoglobin A1c was 5.5 -Remains on Lantus 10 units twice daily and sliding scale insulin, CBGs are less than 170  Pressure injury on the skin of nose.bilateral cheeks Local wound care. -Appears to be healing  Bleeding facial wound one-point centimeter wound on the left cheek status post injection with 1% lidocaine and epi.  If not healed in 1 week biopsy recommended by ENT.  DVT Prophylaxis:Lovenox  PUD Prophylaxis:On famotidine/ppi Code Status:Partial code Family Communication:Cussed with wife Tourist information centre manager international number [phone].(Interpreter number is [phone]. If you call her she will connect you to family and interpret for you) disposition Plan:needs trachcapping trials, possible decannulationspeech eval, PMVand diet advancement, depends on ongoing  renal recovery Now a candidate for CIR repeat COVID 19 test negative x2 on 5/28 and 5/30 patient will not be able to be discharged home as long as he has a trach in place  Pressure Injury 03/03/19 Face Right Unstageable - Full thickness tissue loss in which the base of the ulcer is covered by slough (yellow, tan, gray, green or brown) and/or eschar (tan, brown or black) in the wound bed. 2cm x2 1/4 cm  red edges  with blac (Active)  03/03/19 0830  Location: Face  Location Orientation: Right  Staging: Unstageable - Full thickness tissue loss in which the base of the ulcer is covered by slough (yellow, tan, gray, green or brown) and/or eschar (tan, brown or black) in the wound bed.  Wound Description (Comments): 2cm x2 1/4 cm  red edges  with black cneter  Present on Admission: No     Pressure Injury 03/03/19 Face Left Unstageable - Full thickness tissue loss in which the base of the ulcer is covered by slough (yellow, tan, gray, green or brown) and/or eschar (tan, brown or black) in the wound bed. 1 1/2 cm x 2 1/2 cm red edges with y (Active)  03/03/19 0830  Location: Face  Location Orientation: Left  Staging: Unstageable - Full thickness tissue loss in which the base of the ulcer is covered by slough (yellow, tan, gray, green or brown) and/or eschar (tan, brown or black) in the wound bed.  Wound Description (Comments): 1 1/2 cm x 2 1/2 cm red edges with yellow center and black ring around yellow area  Present on Admission: No      Nutrition Problem: Inadequate oral intake Etiology: inability to eat     Signs/Symptoms: NPO status    Interventions: Nepro shake  Estimated body mass index is 22.2 kg/m as calculated from the following:  Height as of this encounter: 5\' 5"  (1.651 m).   Weight as of this encounter: 60.5 kg.   Subjective: Resting in bed in no acute distress no overnight events of bleeding noted  Objective: Vitals:   04/09/19 1100 04/09/19 1115 04/09/19 1137  04/09/19 1152  BP:  115/78  116/73  Pulse: (!) 107 (!) 111 (!) 102 (!) 104  Resp: (!) 23 (!) 28 20 (!) 25  Temp:  98 F (36.7 C)  98.5 F (36.9 C)  TempSrc:  Oral  Oral  SpO2: 99% 97% 95% 96%  Weight:      Height:        Intake/Output Summary (Last 24 hours) at 04/09/2019 1341 Last data filed at 04/09/2019 1137 Gross per 24 hour  Intake 1435 ml  Output 1050 ml  Net 385 ml   Filed Weights   04/06/19 0411 04/07/19 0507 04/09/19 0500  Weight: 62.4 kg 62.5 kg 60.5 kg    Examination:  General exam: Appears calm and comfortable  Respiratory system:coarse to auscultation. Respiratory effort normal. Cardiovascular system: S1 & S2 heard, RRR. No JVD, murmurs, rubs, gallops or clicks. No pedal edema. Gastrointestinal system: Abdomen is nondistended, soft and nontender. No organomegaly or masses felt. Normal bowel sounds heard. Central nervous system: Alert and oriented. No focal neurological deficits. Extremities: Symmetric 5 x 5 power. Skin: No rashes, lesions or ulcers Psychiatry: Judgement and insight appear normal. Mood & affect appropriate.     Data Reviewed: I have personally reviewed following labs and imaging studies  CBC: Recent Labs  Lab 04/05/19 0555 04/06/19 0435 04/07/19 0939 04/07/19 2358 04/09/19 0549  WBC 10.1 10.9* 8.0 18.8* 12.0*  HGB 9.2* 8.6* 9.0* 8.4* 6.7*  HCT 28.0* 26.3* 28.4* 26.4* 21.6*  MCV 96.6 95.6 91.6 93.0 96.0  PLT 301 321 311 351 161   Basic Metabolic Panel: Recent Labs  Lab 04/05/19 0555 04/06/19 0435 04/07/19 0939 04/07/19 2358 04/09/19 0549  NA 136 137 134* 136 139  K 3.4* 3.3* 2.8* 3.8 4.0  CL 97* 96* 94* 98 106  CO2 24 26 26 24 24   GLUCOSE 138* 103* 221* 208* 131*  BUN 84* 95* 99* 106* 97*  CREATININE 5.41* 5.17* 4.25* 3.78* 3.02*  CALCIUM 10.8* 10.9* 11.2* 11.2* 10.5*  PHOS 8.3* 7.6* 6.5* 4.7* 3.3   GFR: Estimated Creatinine Clearance: 25.6 mL/min (A) (by C-G formula based on SCr of 3.02 mg/dL (H)). Liver Function  Tests: Recent Labs  Lab 04/05/19 0555 04/06/19 0435 04/07/19 0939 04/07/19 2358 04/09/19 0549  ALBUMIN 3.1* 2.9* 3.0* 2.9* 2.8*   No results for input(s): LIPASE, AMYLASE in the last 168 hours. No results for input(s): AMMONIA in the last 168 hours. Coagulation Profile: Recent Labs  Lab 04/07/19 2358  INR 1.1   Cardiac Enzymes: No results for input(s): CKTOTAL, CKMB, CKMBINDEX, TROPONINI in the last 168 hours. BNP (last 3 results) No results for input(s): PROBNP in the last 8760 hours. HbA1C: No results for input(s): HGBA1C in the last 72 hours. CBG: Recent Labs  Lab 04/08/19 1120 04/08/19 1709 04/08/19 1955 04/09/19 0753 04/09/19 1244  GLUCAP 245* 272* 229* 166* 202*   Lipid Profile: No results for input(s): CHOL, HDL, LDLCALC, TRIG, CHOLHDL, LDLDIRECT in the last 72 hours. Thyroid Function Tests: No results for input(s): TSH, T4TOTAL, FREET4, T3FREE, THYROIDAB in the last 72 hours. Anemia Panel: No results for input(s): VITAMINB12, FOLATE, FERRITIN, TIBC, IRON, RETICCTPCT in the last 72 hours. Sepsis Labs: No results for input(s): PROCALCITON, LATICACIDVEN in the  last 168 hours.  Recent Results (from the past 240 hour(s))  SARS Coronavirus 2 (CEPHEID- Performed in [place] hospital lab), Hosp Order     Status: None   Collection Time: 04/02/19 12:40 PM  Result Value Ref Range Status   SARS Coronavirus 2 NEGATIVE NEGATIVE Final    Comment: (NOTE) If result is NEGATIVE SARS-CoV-2 target nucleic acids are NOT DETECTED. The SARS-CoV-2 RNA is generally detectable in upper and lower  respiratory specimens during the acute phase of infection. The lowest  concentration of SARS-CoV-2 viral copies this assay can detect is 250  copies / mL. A negative result does not preclude SARS-CoV-2 infection  and should not be used as the sole basis for treatment or other  patient management decisions.  A negative result may occur with  improper specimen collection / handling,  submission of specimen other  than nasopharyngeal swab, presence of viral mutation(s) within the  areas targeted by this assay, and inadequate number of viral copies  (<250 copies / mL). A negative result must be combined with clinical  observations, patient history, and epidemiological information. If result is POSITIVE SARS-CoV-2 target nucleic acids are DETECTED. The SARS-CoV-2 RNA is generally detectable in upper and lower  respiratory specimens dur ing the acute phase of infection.  Positive  results are indicative of active infection with SARS-CoV-2.  Clinical  correlation with patient history and other diagnostic information is  necessary to determine patient infection status.  Positive results do  not rule out bacterial infection or co-infection with other viruses. If result is PRESUMPTIVE POSTIVE SARS-CoV-2 nucleic acids MAY BE PRESENT.   A presumptive positive result was obtained on the submitted specimen  and confirmed on repeat testing.  While 2019 novel coronavirus  (SARS-CoV-2) nucleic acids may be present in the submitted sample  additional confirmatory testing may be necessary for epidemiological  and / or clinical management purposes  to differentiate between  SARS-CoV-2 and other Sarbecovirus currently known to infect humans.  If clinically indicated additional testing with an alternate test  methodology [phone]) is advised. The SARS-CoV-2 RNA is generally  detectable in upper and lower respiratory sp ecimens during the acute  phase of infection. The expected result is Negative. Fact Sheet for Patients:  StrictlyIdeas.no Fact Sheet for Healthcare Providers: BankingDealers.co.za This test is not yet approved or cleared by the [place] FDA and has been authorized for detection and/or diagnosis of SARS-CoV-2 by FDA under an Emergency Use Authorization (EUA).  This EUA will remain in effect (meaning this test can be  used) for the duration of the COVID-19 declaration under Section 564(b)(1) of the Act, 21 U.S.C. section 360bbb-3(b)(1), unless the authorization is terminated or revoked sooner. Performed at [facility], [place] [address], [place], [place] [address]   SARS Coronavirus 2 (CEPHEID- Performed in [place] hospital lab), Hosp Order     Status: None   Collection Time: 04/03/19 11:28 AM  Result Value Ref Range Status   SARS Coronavirus 2 NEGATIVE NEGATIVE Final    Comment: (NOTE) If result is NEGATIVE SARS-CoV-2 target nucleic acids are NOT DETECTED. The SARS-CoV-2 RNA is generally detectable in upper and lower  respiratory specimens during the acute phase of infection. The lowest  concentration of SARS-CoV-2 viral copies this assay can detect is 250  copies / mL. A negative result does not preclude SARS-CoV-2 infection  and should not be used as the sole basis for treatment or other  patient management decisions.  A negative result  may occur with  improper specimen collection / handling, submission of specimen other  than nasopharyngeal swab, presence of viral mutation(s) within the  areas targeted by this assay, and inadequate number of viral copies  (<250 copies / mL). A negative result must be combined with clinical  observations, patient history, and epidemiological information. If result is POSITIVE SARS-CoV-2 target nucleic acids are DETECTED. The SARS-CoV-2 RNA is generally detectable in upper and lower  respiratory specimens dur ing the acute phase of infection.  Positive  results are indicative of active infection with SARS-CoV-2.  Clinical  correlation with patient history and other diagnostic information is  necessary to determine patient infection status.  Positive results do  not rule out bacterial infection or co-infection with other viruses. If result is PRESUMPTIVE POSTIVE SARS-CoV-2 nucleic acids MAY BE PRESENT.   A presumptive positive result was obtained on  the submitted specimen  and confirmed on repeat testing.  While 2019 novel coronavirus  (SARS-CoV-2) nucleic acids may be present in the submitted sample  additional confirmatory testing may be necessary for epidemiological  and / or clinical management purposes  to differentiate between  SARS-CoV-2 and other Sarbecovirus currently known to infect humans.  If clinically indicated additional testing with an alternate test  methodology [phone]) is advised. The SARS-CoV-2 RNA is generally  detectable in upper and lower respiratory sp ecimens during the acute  phase of infection. The expected result is Negative. Fact Sheet for Patients:  StrictlyIdeas.no Fact Sheet for Healthcare Providers: BankingDealers.co.za This test is not yet approved or cleared by the [place] FDA and has been authorized for detection and/or diagnosis of SARS-CoV-2 by FDA under an Emergency Use Authorization (EUA).  This EUA will remain in effect (meaning this test can be used) for the duration of the COVID-19 declaration under Section 564(b)(1) of the Act, 21 U.S.C. section 360bbb-3(b)(1), unless the authorization is terminated or revoked sooner. Performed at [facility], [place] [address], [place], [place] [address]          Radiology Studies: No results found.      Scheduled Meds: . sodium chloride   Intravenous Once  . chlorhexidine  15 mL Mouth Rinse BID  . Chlorhexidine Gluconate Cloth  6 each Topical Q0600  . Chlorhexidine Gluconate Cloth  6 each Topical Q0600  . clonazepam  0.25 mg Oral Daily  . darbepoetin (ARANESP) injection - NON-DIALYSIS  100 mcg Subcutaneous Q Sat-1800  . enoxaparin (LOVENOX) injection  60 mg Subcutaneous Q24H  . famotidine  20 mg Oral Daily  . feeding supplement (NEPRO CARB STEADY)  237 mL Oral BID BM  . feeding supplement (PRO-STAT SUGAR FREE 64)  30 mL Oral TID  . free water  100 mL Oral Q8H  . guaiFENesin  5  mL Oral BID  . insulin aspart  0-9 Units Subcutaneous TID WC  . insulin aspart  6 Units Subcutaneous TID WC  . insulin glargine  12 Units Subcutaneous BID  . mouth rinse  15 mL Mouth Rinse q12n4p  . mupirocin cream   Topical BID  . pantoprazole  40 mg Oral BID  . senna-docusate  1 tablet Oral BID  . sodium chloride flush  10-40 mL Intracatheter Q12H   Continuous Infusions: . sodium chloride    . sodium chloride Stopped (03/21/19 2128)     LOS: 65 days    Georgette Shell, MD [facility]  If 7PM-7AM, please contact night-coverage [email or web address] Password [email or web address] 04/09/2019, 1:41 PM

## 2019-04-09 NOTE — Progress Notes (Signed)
Inpatient Rehabilitation Admissions Coordinator  Discussed progress with SLP. Horris Latino. I will follow up on Monday with patient with interpreter to discuss overall plan for rehab venue options.  Danne Baxter, RN, MSN Rehab Admissions Coordinator [phone] 04/09/2019 3:09 PM

## 2019-04-09 NOTE — Progress Notes (Signed)
Patient ID: Chasyn Cinque, male   DOB: 05-09-1971, 48 y.o.   MRN: [id number] S: 650 of urine recorded but seems like shift was missed - crt and BUN down- k is god    O:BP 128/80   Pulse 100   Temp 98.4 F (36.9 C) (Oral)   Resp (!) 24   Ht 5\' 5"  (1.651 m)   Wt 60.5 kg   SpO2 99%   BMI 22.20 kg/m   Intake/Output Summary (Last 24 hours) at 04/09/2019 0925 Last data filed at 04/09/2019 0500 Gross per 24 hour  Intake 1120 ml  Output 650 ml  Net 470 ml   Intake/Output: I/O last 3 completed shifts: In: 8938 [P.O.:1360; NG/GT:200] Out: 1050 [Urine:1050]  Intake/Output this shift:  No intake/output data recorded. Weight change:   gen-  Alert, trach- spanish speaking- some coughing- looks better today  Lungs- CBS bilat  abd- soft, non tender Ext no peripheral edema    Recent Labs  Lab 04/03/19 0531 04/04/19 0559 04/05/19 0555 04/06/19 0435 04/07/19 0939 04/07/19 2358 04/09/19 0549  NA 136 136 136 137 134* 136 139  K 4.2 4.0 3.4* 3.3* 2.8* 3.8 4.0  CL 95* 97* 97* 96* 94* 98 106  CO2 24 26 24 26 26 24 24   GLUCOSE 113* 144* 138* 103* 221* 208* 131*  BUN 106* 66* 84* 95* 99* 106* 97*  CREATININE 6.53* 4.90* 5.41* 5.17* 4.25* 3.78* 3.02*  ALBUMIN  --   --  3.1* 2.9* 3.0* 2.9* 2.8*  CALCIUM 13.1* 10.2 10.8* 10.9* 11.2* 11.2* 10.5*  PHOS  --   --  8.3* 7.6* 6.5* 4.7* 3.3   Liver Function Tests: Recent Labs  Lab 04/07/19 0939 04/07/19 2358 04/09/19 0549  ALBUMIN 3.0* 2.9* 2.8*   No results for input(s): LIPASE, AMYLASE in the last 168 hours. No results for input(s): AMMONIA in the last 168 hours. CBC: Recent Labs  Lab 04/05/19 0555 04/06/19 0435 04/07/19 0939 04/07/19 2358 04/09/19 0549  WBC 10.1 10.9* 8.0 18.8* 12.0*  HGB 9.2* 8.6* 9.0* 8.4* 6.7*  HCT 28.0* 26.3* 28.4* 26.4* 21.6*  MCV 96.6 95.6 91.6 93.0 96.0  PLT 301 321 311 351 307   Cardiac Enzymes: No results for input(s): CKTOTAL, CKMB, CKMBINDEX, TROPONINI in the last 168 hours. CBG: Recent  Labs  Lab 04/08/19 0740 04/08/19 1120 04/08/19 1709 04/08/19 1955 04/09/19 0753  GLUCAP 119* 245* 272* 229* 166*    Iron Studies: No results for input(s): IRON, TIBC, TRANSFERRIN, FERRITIN in the last 72 hours. Studies/Results: No results found. . sodium chloride   Intravenous Once  . chlorhexidine  15 mL Mouth Rinse BID  . Chlorhexidine Gluconate Cloth  6 each Topical Q0600  . Chlorhexidine Gluconate Cloth  6 each Topical Q0600  . clonazepam  0.25 mg Oral Daily  . darbepoetin (ARANESP) injection - NON-DIALYSIS  100 mcg Subcutaneous Q Sat-1800  . enoxaparin (LOVENOX) injection  60 mg Subcutaneous Q24H  . famotidine  20 mg Oral Daily  . feeding supplement (NEPRO CARB STEADY)  237 mL Oral BID BM  . feeding supplement (PRO-STAT SUGAR FREE 64)  30 mL Oral TID  . free water  100 mL Oral Q8H  . guaiFENesin  5 mL Oral BID  . insulin aspart  0-9 Units Subcutaneous TID WC  . insulin aspart  6 Units Subcutaneous TID WC  . insulin glargine  12 Units Subcutaneous BID  . mouth rinse  15 mL Mouth Rinse q12n4p  . mupirocin cream   Topical  BID  . pantoprazole  40 mg Oral BID  . senna-docusate  1 tablet Oral BID  . sodium chloride flush  10-40 mL Intracatheter Q12H    BMET    Component Value Date/Time   NA 139 04/09/2019 0549   K 4.0 04/09/2019 0549   CL 106 04/09/2019 0549   CO2 24 04/09/2019 0549   GLUCOSE 131 (H) 04/09/2019 0549   BUN 97 (H) 04/09/2019 0549   CREATININE 3.02 (H) 04/09/2019 0549   CALCIUM 10.5 (H) 04/09/2019 0549   GFRNONAA 23 (L) 04/09/2019 0549   GFRAA 27 (L) 04/09/2019 0549   CBC    Component Value Date/Time   WBC 12.0 (H) 04/09/2019 0549   RBC 2.25 (L) 04/09/2019 0549   HGB 6.7 (LL) 04/09/2019 0549   HCT 21.6 (L) 04/09/2019 0549   PLT 307 04/09/2019 0549   MCV 96.0 04/09/2019 0549   MCH 29.8 04/09/2019 0549   MCHC 31.0 04/09/2019 0549   RDW 14.7 04/09/2019 0549   LYMPHSABS 3.1 03/14/2019 1206   MONOABS 2.2 (H) 03/14/2019 1206   EOSABS 0.3  03/14/2019 1206   BASOSABS 0.2 (H) 03/14/2019 1206   Brief HPI: Pt is a 48 y.o. yo male who was admitted on 02/23/2019 with anuric AKI due to ATN in the setting of COVID   Assessment/Plan:  1. AKI in setting of covid-19 infection.  Crt 0.95 2 mos ago. Started on CVVHD on 02/23/19 and transitioned to IHD on 04/01/19- doing HD TTS via [facility].  starting to have an increase in UOP.  Last HD Sat and kept even. Initial crt rise but now went down now 4 days in a row- BUN lagging behind which is not unusual but trended down today.  Will continue to hold on HD and follow - maybe a little dry ?  Will keep [place] in maybe one more day  2. Covid-  Improving- now with 2 negative tests- out of isolation 3. Acute hypoxic respiratory failure s/p trach and off of vent since 03/25/19- still is an issue  4. Anemia- due to acute illness. S/p transfusion on 03/28/19 and started on Aranesp 03/27/19- giving one unit again today  5. Hypercalcemia-   Possibly due to immobilization. Phos was up but getting better , no binder - calc and phos both better today  6. Dispo- difficult, no insurance.  Have not started CLIP process as do not know dispo and hoping that he may recover renal function ?? 6. HTN/volume-  - on no BP meds - BP OK- some tachy- stopped lasix - gave fluid on 6/4 7. Hypokalemia- is better   [name]

## 2019-04-09 NOTE — Progress Notes (Signed)
  Speech Language Pathology Treatment: Dysphagia;Passy Muir Speaking valve  Patient Details Name: Jonathan [name] MRN: [id number] DOB: [birth year] Today's Date: 04/09/2019 Time: 0930-1040 SLP Time Calculation (min) (ACUTE ONLY): 70 min  Assessment / Plan / Recommendation Clinical Impression  Pt observed with PMSV in place for over 1 hour, stable vitals, dysphonic but intelligible vocal quality. Pt intuitively chunking words to compensate for decreased breath support. Able to manage volume appropriately. Pt abel to return demonstrate placemeent and removal of PMSV x2 and verbalize precautions. Needs reminders from staff to remove when sleeping, discussed with RN.  Pt also consumed several snacks of solids consistently without difficulty, will upgrade diet to dys 3 mech soft. There is immediate coughing following sips of water, decreased with cues for a small single sip and oral hold. For now pt to continue nectar thick liquids. Expect potential upgrade next week.   Pt verbalized many concerns about his plan of care. He worried about where he will go, access to any money, communicating with his daughter, where his phone is located. He can remember most major recent events, but still reports some difficulty with memory of past events. I tried to help clarify these with him.  Will continue to work with this pt who would make an excellent rehab candidate.     HPI HPI: Pt is a 48 y.o. male admitted on 02/23/19 with AMS, O2 sats in the ED were 50%.  Pt dx with COVID 19 and resultant ARDS.  He was intubated 4/21-5/11; reintubated and then trached on 5/12. His course was complicated by acute kidney injury requiring CVVHD.  PMH includes DM2, HTN.. Pt [facility] on 5/26; now requiring intermittent HD.      SLP Plan  Continue with current plan of care       Recommendations  Diet recommendations: Dysphagia 3 (mechanical soft);Nectar-thick liquid Liquids provided via:  Cup;Straw Medication Administration: Whole meds with puree Supervision: Patient able to self feed      Patient may use Passy-Muir Speech Valve: During PO intake/meals;During all waking hours (remove during sleep);During all therapies with supervision PMSV Supervision: Intermittent         Oral Care Recommendations: Oral care BID Follow up Recommendations: Inpatient Rehab SLP Visit Diagnosis: Dysphagia, oropharyngeal phase (R13.12);Aphonia (R49.1) Plan: Continue with current plan of care       GO               Jonathan [place], MA [place] Pager [phone] Office [phone]  Jonathan [name] 04/09/2019, 12:06 PM

## 2019-04-10 DIAGNOSIS — Z452 Encounter for adjustment and management of vascular access device: Secondary | ICD-10-CM

## 2019-04-10 LAB — TYPE AND SCREEN
ABO/RH(D): O POS
Antibody Screen: NEGATIVE
Unit division: 0

## 2019-04-10 LAB — BPAM RBC
Blood Product Expiration Date: 202006262359
ISSUE DATE / TIME: 202006051121
Unit Type and Rh: 5100

## 2019-04-10 LAB — CBC
HCT: 23.8 % — ABNORMAL LOW (ref 39.0–52.0)
Hemoglobin: 7.7 g/dL — ABNORMAL LOW (ref 13.0–17.0)
MCH: 30.4 pg (ref 26.0–34.0)
MCHC: 32.4 g/dL (ref 30.0–36.0)
MCV: 94.1 fL (ref 80.0–100.0)
Platelets: 294 10*3/uL (ref 150–400)
RBC: 2.53 MIL/uL — ABNORMAL LOW (ref 4.22–5.81)
RDW: 14 % (ref 11.5–15.5)
WBC: 10.4 10*3/uL (ref 4.0–10.5)
nRBC: 0 % (ref 0.0–0.2)

## 2019-04-10 LAB — GLUCOSE, CAPILLARY
Glucose-Capillary: 118 mg/dL — ABNORMAL HIGH (ref 70–99)
Glucose-Capillary: 131 mg/dL — ABNORMAL HIGH (ref 70–99)
Glucose-Capillary: 145 mg/dL — ABNORMAL HIGH (ref 70–99)
Glucose-Capillary: 152 mg/dL — ABNORMAL HIGH (ref 70–99)
Glucose-Capillary: 193 mg/dL — ABNORMAL HIGH (ref 70–99)
Glucose-Capillary: 253 mg/dL — ABNORMAL HIGH (ref 70–99)

## 2019-04-10 LAB — RENAL FUNCTION PANEL
Albumin: 2.6 g/dL — ABNORMAL LOW (ref 3.5–5.0)
Anion gap: 10 (ref 5–15)
BUN: 63 mg/dL — ABNORMAL HIGH (ref 6–20)
CO2: 24 mmol/L (ref 22–32)
Calcium: 10.4 mg/dL — ABNORMAL HIGH (ref 8.9–10.3)
Chloride: 102 mmol/L (ref 98–111)
Creatinine, Ser: 2.35 mg/dL — ABNORMAL HIGH (ref 0.61–1.24)
GFR calc Af Amer: 37 mL/min — ABNORMAL LOW (ref >60)
GFR calc non Af Amer: 32 mL/min — ABNORMAL LOW (ref >60)
Glucose, Bld: 139 mg/dL — ABNORMAL HIGH (ref 70–99)
Phosphorus: 3.3 mg/dL (ref 2.5–4.6)
Potassium: 3.5 mmol/L (ref 3.5–5.1)
Sodium: 136 mmol/L (ref 135–145)

## 2019-04-10 NOTE — Progress Notes (Signed)
Patient ID: Jonathan [name], male   DOB: 11/08/1970, 48 y.o.   MRN: [id number] S: 2774 of urine recorded  - crt and BUN both down- k is good- given one unit of blood yesterday     O:BP 122/76   Pulse 93   Temp 98.2 F (36.8 C) (Oral)   Resp (!) 23   Ht 5\' 5"  (1.651 m)   Wt 64.5 kg   SpO2 97%   BMI 23.66 kg/m   Intake/Output Summary (Last 24 hours) at 04/10/2019 0836 Last data filed at 04/10/2019 0530 Gross per 24 hour  Intake 315 ml  Output 1850 ml  Net -1535 ml   Intake/Output: I/O last 3 completed shifts: In: 128 [P.O.:440; Blood:315] Out: 2500 [Urine:2500]  Intake/Output this shift:  No intake/output data recorded. Weight change: 4 kg  gen-  Alert, trach- spanish speaking- some coughing- looks better today  Lungs- CBS bilat  abd- soft, non tender Ext no peripheral edema    Recent Labs  Lab 04/04/19 0559 04/05/19 0555 04/06/19 0435 04/07/19 0939 04/07/19 2358 04/09/19 0549 04/10/19 0450  NA 136 136 137 134* 136 139 136  K 4.0 3.4* 3.3* 2.8* 3.8 4.0 3.5  CL 97* 97* 96* 94* 98 106 102  CO2 26 24 26 26 24 24 24   GLUCOSE 144* 138* 103* 221* 208* 131* 139*  BUN 66* 84* 95* 99* 106* 97* 63*  CREATININE 4.90* 5.41* 5.17* 4.25* 3.78* 3.02* 2.35*  ALBUMIN  --  3.1* 2.9* 3.0* 2.9* 2.8* 2.6*  CALCIUM 10.2 10.8* 10.9* 11.2* 11.2* 10.5* 10.4*  PHOS  --  8.3* 7.6* 6.5* 4.7* 3.3 3.3   Liver Function Tests: Recent Labs  Lab 04/07/19 2358 04/09/19 0549 04/10/19 0450  ALBUMIN 2.9* 2.8* 2.6*   No results for input(s): LIPASE, AMYLASE in the last 168 hours. No results for input(s): AMMONIA in the last 168 hours. CBC: Recent Labs  Lab 04/06/19 0435 04/07/19 0939 04/07/19 2358 04/09/19 0549 04/10/19 0450  WBC 10.9* 8.0 18.8* 12.0* 10.4  HGB 8.6* 9.0* 8.4* 6.7* 7.7*  HCT 26.3* 28.4* 26.4* 21.6* 23.8*  MCV 95.6 91.6 93.0 96.0 94.1  PLT 321 311 351 307 294   Cardiac Enzymes: No results for input(s): CKTOTAL, CKMB, CKMBINDEX, TROPONINI in the last 168  hours. CBG: Recent Labs  Lab 04/09/19 1651 04/09/19 1951 04/09/19 2313 04/10/19 0323 04/10/19 0807  GLUCAP 165* 206* 113* 118* 145*    Iron Studies: No results for input(s): IRON, TIBC, TRANSFERRIN, FERRITIN in the last 72 hours. Studies/Results: No results found. . chlorhexidine  15 mL Mouth Rinse BID  . Chlorhexidine Gluconate Cloth  6 each Topical Q0600  . Chlorhexidine Gluconate Cloth  6 each Topical Q0600  . clonazepam  0.25 mg Oral Daily  . darbepoetin (ARANESP) injection - NON-DIALYSIS  100 mcg Subcutaneous Q Sat-1800  . enoxaparin (LOVENOX) injection  60 mg Subcutaneous Q24H  . famotidine  20 mg Oral Daily  . feeding supplement (NEPRO CARB STEADY)  237 mL Oral BID BM  . feeding supplement (PRO-STAT SUGAR FREE 64)  30 mL Oral TID  . free water  100 mL Oral Q8H  . guaiFENesin  5 mL Oral BID  . insulin aspart  0-9 Units Subcutaneous TID WC  . insulin aspart  6 Units Subcutaneous TID WC  . insulin glargine  12 Units Subcutaneous BID  . mouth rinse  15 mL Mouth Rinse q12n4p  . mupirocin cream   Topical BID  . pantoprazole  40 mg Oral  BID  . senna-docusate  1 tablet Oral BID  . sodium chloride flush  10-40 mL Intracatheter Q12H    BMET    Component Value Date/Time   NA 136 04/10/2019 0450   K 3.5 04/10/2019 0450   CL 102 04/10/2019 0450   CO2 24 04/10/2019 0450   GLUCOSE 139 (H) 04/10/2019 0450   BUN 63 (H) 04/10/2019 0450   CREATININE 2.35 (H) 04/10/2019 0450   CALCIUM 10.4 (H) 04/10/2019 0450   GFRNONAA 32 (L) 04/10/2019 0450   GFRAA 37 (L) 04/10/2019 0450   CBC    Component Value Date/Time   WBC 10.4 04/10/2019 0450   RBC 2.53 (L) 04/10/2019 0450   HGB 7.7 (L) 04/10/2019 0450   HCT 23.8 (L) 04/10/2019 0450   PLT 294 04/10/2019 0450   MCV 94.1 04/10/2019 0450   MCH 30.4 04/10/2019 0450   MCHC 32.4 04/10/2019 0450   RDW 14.0 04/10/2019 0450   LYMPHSABS 3.1 03/14/2019 1206   MONOABS 2.2 (H) 03/14/2019 1206   EOSABS 0.3 03/14/2019 1206   BASOSABS 0.2  (H) 03/14/2019 1206   Brief HPI: Pt is a 48 y.o. yo male who was admitted on 02/23/2019 with anuric AKI due to ATN in the setting of COVID   Assessment/Plan:  1. AKI in setting of covid-19 infection.  Crt 0.95 2 mos ago. Started on CVVHD on 02/23/19 and transitioned to IHD on 04/01/19- doing HD TTS via [facility].  starting to have an increase in UOP.  Last HD Sat and kept even. Initial crt rise but now went down now several days in a row- crt in the 2's- will put in order for [facility] removal !   2. Covid-  Improving- now with 2 negative tests- out of isolation 3. Acute hypoxic respiratory failure s/p trach and off of vent since 03/25/19- still is an issue  4. Anemia- due to acute illness. S/p transfusion on 03/28/19 and started on Aranesp 03/27/19- another unit 6/5  5. Hypercalcemia-   Possibly due to immobilization. Phos was up but getting better , no binder - calc and phos both better today  6. Dispo- difficult, no insurance.  Does not need CLIP process as  recovering renal function  6. HTN/volume-  - on no BP meds - BP OK- some tachy- stopped lasix - gave fluid on 6/4, got blood on 6/5-  I think may have a tendency to be dry with insensible losses - keep in mind  7. Hypokalemia- is better   [name]

## 2019-04-10 NOTE — Progress Notes (Signed)
PROGRESS NOTE    Jonathan [name]  YJE:[id number] DOB: [birth year] DOA: 02/23/2019 PCP: Patient, No Pcp Per  Brief Narrative:48 year old Spanish-speaking male from [place], was admitted on 4/21 with increased respiratory distress, he was intubated on admission for ARDS due to SARS COVID-19 infection. -Unfortunately developed severe ATN required CRRT, prolonged respiratory failure , was extubated and reintubated earlier this month, subsequently underwent tracheostomy on 5/12 , followed by placement of a permacath on 5/13 -Ventrally liberated off the ventilator 5/21 however remained anuric requiring intermittent hemodialysis subsequently transferred to [facility] 5/26 from [facility]. -Was also started on anticoagulation while in ICU for suspected pulmonary embolism -5/29 passed swallow evaluation, his core track was removed and he was started on dysphagia diet with nectar thick liquids, last HD 5/30,hopeful for renal recovery -Repeat COVID tested negative x2 on 5/29 and 5/30 Events: 02/23/2019 admitted to the ICU. 02/24/2019 started on CRRT 4/21-4/24 Proned daily 4/28 -4/30 proned daily  03/04/2019 increase in oxygen demand required paralytic for 24 hours. 03/09/2019 hematochezia from gastric aspiration 5/11 extubated 5/12 re-intubated, tracheostomy 5/13 perm cath 5/14 worsening hypoxemia, shock suddenly, RV dilated, no new CXR changes, treated for PE empirically 5/21 doing t-piece trials 5/23: Trial off of CVVHD 5/24: 2 units PRBC 5/25 remains off of ventilator support since May 21, remains anuric, 5/26 patient transferred to [facility] from [facility] as he needed intermittent hemodialysis  5/29 passed modified swallow eval 5/30 started on nectar thick liquis and taking pills by mouth,dialysis Lines and tubes: 4/21 - ETT >>5.11.2020 4/21 - RIJ CVC, removed 4/22 L IJ HD Cath> removed 5/13 Permacath right Antibiotics: 02/23/2019-425 2020 azithromycin and Rocephin 02/23/2019-02/24/2019  03/06/2019 Rocephin 5/11-5/17Vancomycin 5/12-5/18Zosyn 5/12-5/17Fluconazole for candida in tracheal aspirate and recent Actemra  I took over his care 04/07/2019. Overnight patient had an episode of bleeding from the pressure ulcer on his face which needed epi and lidocaine by ENT. Lovenox was stopped. He is on Lovenox for presumed pulmonary embolism.  Significant diagnostic Tests; POC U/S 4/21: Bilateral interstitial pattern with bilateral dense consolidation at bases Echo shows low-normal LVSF. RV moderately dilated with septal shift.  5/14 Echo> LVEF > 65%, RV dilated, decreased RV function (moderate) with RVSP 60  Assessment & Plan:   Active Problems:   ARDS (adult respiratory distress syndrome) (HCC)   Pressure injury of skin   Acute respiratory failure with hypoxia ([place])   COVID-19   AKI (acute kidney injury) ([place])   ATN (acute tubular necrosis) ([place])   Tracheostomy care ([place])   On tube feeding diet   Status post tracheostomy ([place])   History of ETT   Encounter for intubation   Acute Hypoxic Resp. Failure due to Acute Covid 19 Viral Illness/ARDS/septic shock  -Treated at [facility] with IV steroids, IV Actemra on 4/22, -Also received convalescent plasma on 5/3 -Status post tracheostomy 5/12 -Weaned off the ventilator 5/21 -Still has moderate O2 requirement with FiO2 of 60% -Chest x-ray 5/30 showed low lung volumes and patchy airspace disease which was improving -Complicated by ATN, anuria initially was on CRRT, now on intermittent hemodialysis, hoping for renal recovery -CCM following intermittently for trach care, trach was downsized to #4 uncuffed [place] on 5/26. Patient remains on 8 L/35% FiO2 of oxygen saturation 94%. Hopefully capping trial this week with early decannulation. -CIRconsulted and foll  Presumed acute PE -On 5/14 patient had a sudden decline with shock, worsening hypoxemia and echocardiogram which showed right heart strain -CT angiogram could not be  obtained due to severe AKI -He was  started on IV heparin for presumed PE, Dopplers were negative -3 months of anticoagulation recommended -Lovenox has been restarted 04/08/2019 since he did not have any further active bleeding. Acute kidney injury due to ATN/Covid -Baseline creatinine was normal 2 months ago, he started CRRT on 4/21 and transition to intermittent hemodialysis 5/28, was undergoing HD Tuesday Thursday Saturday via TDC,mild improvement in urine output noted1450cc last 24 hours,labs improving creatinine 2.35 potassium 3.5 BUN 63 -Nephrology following hopefully continues tohaverenal recovery  Anemia due to critical illness-he had small amount of bleeding from the tracheostomy site received 3 units of blood transfusion overall during this hospital stay.  His hemoglobin today 7.7 monitor closely.  He also had bleeding from the pressure ulcer on the left side of his face which has been resolved.  Dysphagia -Insetting of subacute respiratory failure, tracheostomy -SLPrecommends dysphagia 1 puree nectar thick liquid.  Hyperglycemia -No clear history of diabetes, hemoglobin A1c was 5.5 -Remains on Lantus 10 units twice daily and sliding scale insulin, CBGs are less than 170  Pressure injury on the skin of nose.bilateral cheeks Local wound care. -Appears to be healing  Bleeding facial wound one-point centimeter wound on the left cheek status post injection with 1% lidocaine and epi. If not healed in 1 week biopsy recommended by ENT.  DVT Prophylaxis:Lovenox  PUD Prophylaxis:On famotidine/ppi Code Status:Partial code Family Communication disposition Plan eventually discharged to CIR depending on renal recovery and trach capping trials, PMV and diet advancement.  Speech therapy following.  Pressure Injury 03/03/19 Face Right Unstageable - Full thickness tissue loss in which the base of the ulcer is covered by slough (yellow, tan, gray, green or brown) and/or eschar  (tan, brown or black) in the wound bed. 2cm x2 1/4 cm  red edges  with blac (Active)  03/03/19 0830  Location: Face  Location Orientation: Right  Staging: Unstageable - Full thickness tissue loss in which the base of the ulcer is covered by slough (yellow, tan, gray, green or brown) and/or eschar (tan, brown or black) in the wound bed.  Wound Description (Comments): 2cm x2 1/4 cm  red edges  with black cneter  Present on Admission: No     Pressure Injury 03/03/19 Face Left Unstageable - Full thickness tissue loss in which the base of the ulcer is covered by slough (yellow, tan, gray, green or brown) and/or eschar (tan, brown or black) in the wound bed. 1 1/2 cm x 2 1/2 cm red edges with y (Active)  03/03/19 0830  Location: Face  Location Orientation: Left  Staging: Unstageable - Full thickness tissue loss in which the base of the ulcer is covered by slough (yellow, tan, gray, green or brown) and/or eschar (tan, brown or black) in the wound bed.  Wound Description (Comments): 1 1/2 cm x 2 1/2 cm red edges with yellow center and black ring around yellow area  Present on Admission: No      Nutrition Problem: Inadequate oral intake Etiology: inability to eat     Signs/Symptoms: NPO status    Interventions: Nepro shake  Estimated body mass index is 23.66 kg/m as calculated from the following:   Height as of this encounter: 5\' 5"  (1.651 m).   Weight as of this encounter: 64.5 kg.    Subjective: Sitting on bedpan staff denies any events overnight had multiple bowel movements yesterday none of them are loose  Objective: Vitals:   04/10/19 0500 04/10/19 0600 04/10/19 0805 04/10/19 1115  BP:    132/82  Pulse: 94 85 93 (!) 107  Resp: (!) 26 (!) 21 (!) 23 16  Temp:    98.2 F (36.8 C)  TempSrc:    Oral  SpO2: 96% 96% 97% 93%  Weight:      Height:        Intake/Output Summary (Last 24 hours) at 04/10/2019 1122 Last data filed at 04/10/2019 0530 Gross per 24 hour  Intake 315  ml  Output 1450 ml  Net -1135 ml   Filed Weights   04/07/19 0507 04/09/19 0500 04/10/19 0326  Weight: 62.5 kg 60.5 kg 64.5 kg    Examination:  General exam: Appears calm and comfortable  Respiratory system: Few scattered rhonchi bilaterally to auscultation. Respiratory effort normal. Cardiovascular system: S1 & S2 heard, RRR. No JVD, murmurs, rubs, gallops or clicks. No pedal edema. Gastrointestinal system: Abdomen is nondistended, soft and nontender. No organomegaly or masses felt. Normal bowel sounds heard. Central nervous system: Alert and oriented. No focal neurological deficits. Extremities: Symmetric 5 x 5 power. Skin: No rashes, lesions or ulcers Psychiatry: Judgement and insight appear normal. Mood & affect appropriate.     Data Reviewed: I have personally reviewed following labs and imaging studies  CBC: Recent Labs  Lab 04/06/19 0435 04/07/19 0939 04/07/19 2358 04/09/19 0549 04/10/19 0450  WBC 10.9* 8.0 18.8* 12.0* 10.4  HGB 8.6* 9.0* 8.4* 6.7* 7.7*  HCT 26.3* 28.4* 26.4* 21.6* 23.8*  MCV 95.6 91.6 93.0 96.0 94.1  PLT 321 311 351 307 427   Basic Metabolic Panel: Recent Labs  Lab 04/06/19 0435 04/07/19 0939 04/07/19 2358 04/09/19 0549 04/10/19 0450  NA 137 134* 136 139 136  K 3.3* 2.8* 3.8 4.0 3.5  CL 96* 94* 98 106 102  CO2 26 26 24 24 24   GLUCOSE 103* 221* 208* 131* 139*  BUN 95* 99* 106* 97* 63*  CREATININE 5.17* 4.25* 3.78* 3.02* 2.35*  CALCIUM 10.9* 11.2* 11.2* 10.5* 10.4*  PHOS 7.6* 6.5* 4.7* 3.3 3.3   GFR: Estimated Creatinine Clearance: 33.4 mL/min (A) (by C-G formula based on SCr of 2.35 mg/dL (H)). Liver Function Tests: Recent Labs  Lab 04/06/19 0435 04/07/19 0939 04/07/19 2358 04/09/19 0549 04/10/19 0450  ALBUMIN 2.9* 3.0* 2.9* 2.8* 2.6*   No results for input(s): LIPASE, AMYLASE in the last 168 hours. No results for input(s): AMMONIA in the last 168 hours. Coagulation Profile: Recent Labs  Lab 04/07/19 2358  INR 1.1    Cardiac Enzymes: No results for input(s): CKTOTAL, CKMB, CKMBINDEX, TROPONINI in the last 168 hours. BNP (last 3 results) No results for input(s): PROBNP in the last 8760 hours. HbA1C: No results for input(s): HGBA1C in the last 72 hours. CBG: Recent Labs  Lab 04/09/19 1951 04/09/19 2313 04/10/19 0323 04/10/19 0807 04/10/19 1119  GLUCAP 206* 113* 118* 145* 193*   Lipid Profile: No results for input(s): CHOL, HDL, LDLCALC, TRIG, CHOLHDL, LDLDIRECT in the last 72 hours. Thyroid Function Tests: No results for input(s): TSH, T4TOTAL, FREET4, T3FREE, THYROIDAB in the last 72 hours. Anemia Panel: No results for input(s): VITAMINB12, FOLATE, FERRITIN, TIBC, IRON, RETICCTPCT in the last 72 hours. Sepsis Labs: No results for input(s): PROCALCITON, LATICACIDVEN in the last 168 hours.  Recent Results (from the past 240 hour(s))  SARS Coronavirus 2 (CEPHEID- Performed in [place] hospital lab), Hosp Order     Status: None   Collection Time: 04/02/19 12:40 PM  Result Value Ref Range Status   SARS Coronavirus 2 NEGATIVE NEGATIVE Final    Comment: (NOTE)  If result is NEGATIVE SARS-CoV-2 target nucleic acids are NOT DETECTED. The SARS-CoV-2 RNA is generally detectable in upper and lower  respiratory specimens during the acute phase of infection. The lowest  concentration of SARS-CoV-2 viral copies this assay can detect is 250  copies / mL. A negative result does not preclude SARS-CoV-2 infection  and should not be used as the sole basis for treatment or other  patient management decisions.  A negative result may occur with  improper specimen collection / handling, submission of specimen other  than nasopharyngeal swab, presence of viral mutation(s) within the  areas targeted by this assay, and inadequate number of viral copies  (<250 copies / mL). A negative result must be combined with clinical  observations, patient history, and epidemiological information. If result is POSITIVE  SARS-CoV-2 target nucleic acids are DETECTED. The SARS-CoV-2 RNA is generally detectable in upper and lower  respiratory specimens dur ing the acute phase of infection.  Positive  results are indicative of active infection with SARS-CoV-2.  Clinical  correlation with patient history and other diagnostic information is  necessary to determine patient infection status.  Positive results do  not rule out bacterial infection or co-infection with other viruses. If result is PRESUMPTIVE POSTIVE SARS-CoV-2 nucleic acids MAY BE PRESENT.   A presumptive positive result was obtained on the submitted specimen  and confirmed on repeat testing.  While 2019 novel coronavirus  (SARS-CoV-2) nucleic acids may be present in the submitted sample  additional confirmatory testing may be necessary for epidemiological  and / or clinical management purposes  to differentiate between  SARS-CoV-2 and other Sarbecovirus currently known to infect humans.  If clinically indicated additional testing with an alternate test  methodology [phone]) is advised. The SARS-CoV-2 RNA is generally  detectable in upper and lower respiratory sp ecimens during the acute  phase of infection. The expected result is Negative. Fact Sheet for Patients:  StrictlyIdeas.no Fact Sheet for Healthcare Providers: BankingDealers.co.za This test is not yet approved or cleared by the [place] FDA and has been authorized for detection and/or diagnosis of SARS-CoV-2 by FDA under an Emergency Use Authorization (EUA).  This EUA will remain in effect (meaning this test can be used) for the duration of the COVID-19 declaration under Section 564(b)(1) of the Act, 21 U.S.C. section 360bbb-3(b)(1), unless the authorization is terminated or revoked sooner. Performed at [facility], [place] [address] [place], [place] [address]   SARS Coronavirus 2 (CEPHEID- Performed in [place] hospital  lab), Hosp Order     Status: None   Collection Time: 04/03/19 11:28 AM  Result Value Ref Range Status   SARS Coronavirus 2 NEGATIVE NEGATIVE Final    Comment: (NOTE) If result is NEGATIVE SARS-CoV-2 target nucleic acids are NOT DETECTED. The SARS-CoV-2 RNA is generally detectable in upper and lower  respiratory specimens during the acute phase of infection. The lowest  concentration of SARS-CoV-2 viral copies this assay can detect is 250  copies / mL. A negative result does not preclude SARS-CoV-2 infection  and should not be used as the sole basis for treatment or other  patient management decisions.  A negative result may occur with  improper specimen collection / handling, submission of specimen other  than nasopharyngeal swab, presence of viral mutation(s) within the  areas targeted by this assay, and inadequate number of viral copies  (<250 copies / mL). A negative result must be combined with clinical  observations, patient history, and epidemiological information. If  result is POSITIVE SARS-CoV-2 target nucleic acids are DETECTED. The SARS-CoV-2 RNA is generally detectable in upper and lower  respiratory specimens dur ing the acute phase of infection.  Positive  results are indicative of active infection with SARS-CoV-2.  Clinical  correlation with patient history and other diagnostic information is  necessary to determine patient infection status.  Positive results do  not rule out bacterial infection or co-infection with other viruses. If result is PRESUMPTIVE POSTIVE SARS-CoV-2 nucleic acids MAY BE PRESENT.   A presumptive positive result was obtained on the submitted specimen  and confirmed on repeat testing.  While 2019 novel coronavirus  (SARS-CoV-2) nucleic acids may be present in the submitted sample  additional confirmatory testing may be necessary for epidemiological  and / or clinical management purposes  to differentiate between  SARS-CoV-2 and other Sarbecovirus  currently known to infect humans.  If clinically indicated additional testing with an alternate test  methodology [phone]) is advised. The SARS-CoV-2 RNA is generally  detectable in upper and lower respiratory sp ecimens during the acute  phase of infection. The expected result is Negative. Fact Sheet for Patients:  StrictlyIdeas.no Fact Sheet for Healthcare Providers: BankingDealers.co.za This test is not yet approved or cleared by the [place] FDA and has been authorized for detection and/or diagnosis of SARS-CoV-2 by FDA under an Emergency Use Authorization (EUA).  This EUA will remain in effect (meaning this test can be used) for the duration of the COVID-19 declaration under Section 564(b)(1) of the Act, 21 U.S.C. section 360bbb-3(b)(1), unless the authorization is terminated or revoked sooner. Performed at [facility], [place] [address] [place], [place] [address]          Radiology Studies: No results found.      Scheduled Meds: . chlorhexidine  15 mL Mouth Rinse BID  . Chlorhexidine Gluconate Cloth  6 each Topical Q0600  . clonazepam  0.25 mg Oral Daily  . darbepoetin (ARANESP) injection - NON-DIALYSIS  100 mcg Subcutaneous Q Sat-1800  . enoxaparin (LOVENOX) injection  60 mg Subcutaneous Q24H  . feeding supplement (NEPRO CARB STEADY)  237 mL Oral BID BM  . feeding supplement (PRO-STAT SUGAR FREE 64)  30 mL Oral TID  . free water  100 mL Oral Q8H  . guaiFENesin  5 mL Oral BID  . insulin aspart  0-9 Units Subcutaneous TID WC  . insulin aspart  6 Units Subcutaneous TID WC  . insulin glargine  12 Units Subcutaneous BID  . mouth rinse  15 mL Mouth Rinse q12n4p  . mupirocin cream   Topical BID  . pantoprazole  40 mg Oral BID  . senna-docusate  1 tablet Oral BID  . sodium chloride flush  10-40 mL Intracatheter Q12H   Continuous Infusions: . sodium chloride    . sodium chloride Stopped (03/21/19 2128)      LOS: 90 days     Georgette Shell, MD [facility] If 7PM-7AM, please contact night-coverage [email or web address] Password [email or web address] 04/10/2019, 11:22 AM

## 2019-04-10 NOTE — Consult Note (Signed)
History of Present Illness: This is a 48 y.o. male originally admitted with ARDS secondary to [place] COVID-19.  Patient had ATN requiring CRRT right IJ TDC was placed 03/17/2019 while patient was at [facility].  No longer requiring dialysis request to remove tunneled catheter.  Social History   Socioeconomic History  . Marital status: Unknown    Spouse name: Not on file  . Number of children: Not on file  . Years of education: Not on file  . Highest education level: Not on file  Occupational History  . Not on file  Social Needs  . Financial resource strain: Not on file  . Food insecurity:    Worry: Not on file    Inability: Not on file  . Transportation needs:    Medical: Not on file    Non-medical: Not on file  Tobacco Use  . Smoking status: Never Smoker  . Smokeless tobacco: Never Used  Substance and Sexual Activity  . Alcohol use: Not Currently  . Drug use: Not Currently  . Sexual activity: Yes    Partners: Female  Lifestyle  . Physical activity:    Days per week: Not on file    Minutes per session: Not on file  . Stress: Not on file  Relationships  . Social connections:    Talks on phone: Not on file    Gets together: Not on file    Attends religious service: Not on file    Active member of club or organization: Not on file    Attends meetings of clubs or organizations: Not on file    Relationship status: Not on file  . Intimate partner violence:    Fear of current or ex partner: Not on file    Emotionally abused: Not on file    Physically abused: Not on file    Forced sexual activity: Not on file  Other Topics Concern  . Not on file  Social History Narrative  . Not on file     No family history on file.  ROS: No complaints  Physical Examination  Vitals:   04/10/19 0805 04/10/19 1115  BP:  132/82  Pulse: 93 (!) 107  Resp: (!) 23 16  Temp:  98.2 F (36.8 C)  SpO2: 97% 93%   Body mass index is 23.66 kg/m.  Awake alert and oriented  Tracheostomy on nonrebreather Tunnel dialysis catheter right IJ without sign of infection  CBC    Component Value Date/Time   WBC 10.4 04/10/2019 0450   RBC 2.53 (L) 04/10/2019 0450   HGB 7.7 (L) 04/10/2019 0450   HCT 23.8 (L) 04/10/2019 0450   PLT 294 04/10/2019 0450   MCV 94.1 04/10/2019 0450   MCH 30.4 04/10/2019 0450   MCHC 32.4 04/10/2019 0450   RDW 14.0 04/10/2019 0450   LYMPHSABS 3.1 03/14/2019 1206   MONOABS 2.2 (H) 03/14/2019 1206   EOSABS 0.3 03/14/2019 1206   BASOSABS 0.2 (H) 03/14/2019 1206    BMET    Component Value Date/Time   NA 136 04/10/2019 0450   K 3.5 04/10/2019 0450   CL 102 04/10/2019 0450   CO2 24 04/10/2019 0450   GLUCOSE 139 (H) 04/10/2019 0450   BUN 63 (H) 04/10/2019 0450   CREATININE 2.35 (H) 04/10/2019 0450   CALCIUM 10.4 (H) 04/10/2019 0450   GFRNONAA 32 (L) 04/10/2019 0450   GFRAA 37 (L) 04/10/2019 0450    COAGS: Lab Results  Component Value Date   INR 1.1 04/07/2019  INR 1.2 03/16/2019   INR 1.2 03/14/2019    ASSESSMENT/PLAN: This is a 48 y.o. male status post tunneled dialysis catheter placement for ATN.  Renal function has recovered.  I discussed with the patient removing the catheter.  Sutures were removed catheter easily pulled out pressure was held until hemostasis obtained.  Patient tolerated without any acute issue.  Lyndal Alamillo C. Donzetta Matters, MD [facility] Office: [phone] Pager: [phone]

## 2019-04-11 LAB — CBC
HCT: 24.5 % — ABNORMAL LOW (ref 39.0–52.0)
Hemoglobin: 7.8 g/dL — ABNORMAL LOW (ref 13.0–17.0)
MCH: 29.5 pg (ref 26.0–34.0)
MCHC: 31.8 g/dL (ref 30.0–36.0)
MCV: 92.8 fL (ref 80.0–100.0)
Platelets: 325 10*3/uL (ref 150–400)
RBC: 2.64 MIL/uL — ABNORMAL LOW (ref 4.22–5.81)
RDW: 14.1 % (ref 11.5–15.5)
WBC: 10.8 10*3/uL — ABNORMAL HIGH (ref 4.0–10.5)
nRBC: 0 % (ref 0.0–0.2)

## 2019-04-11 LAB — COMPREHENSIVE METABOLIC PANEL
ALT: 29 U/L (ref 0–44)
AST: 24 U/L (ref 15–41)
Albumin: 2.7 g/dL — ABNORMAL LOW (ref 3.5–5.0)
Alkaline Phosphatase: 117 U/L (ref 38–126)
Anion gap: 9 (ref 5–15)
BUN: 52 mg/dL — ABNORMAL HIGH (ref 6–20)
CO2: 25 mmol/L (ref 22–32)
Calcium: 10.3 mg/dL (ref 8.9–10.3)
Chloride: 103 mmol/L (ref 98–111)
Creatinine, Ser: 2 mg/dL — ABNORMAL HIGH (ref 0.61–1.24)
GFR calc Af Amer: 44 mL/min — ABNORMAL LOW (ref >60)
GFR calc non Af Amer: 38 mL/min — ABNORMAL LOW (ref >60)
Glucose, Bld: 114 mg/dL — ABNORMAL HIGH (ref 70–99)
Potassium: 3.5 mmol/L (ref 3.5–5.1)
Sodium: 137 mmol/L (ref 135–145)
Total Bilirubin: 0.5 mg/dL (ref 0.3–1.2)
Total Protein: 6.6 g/dL (ref 6.5–8.1)

## 2019-04-11 LAB — HEPARIN ANTI-XA: Heparin LMW: 0.66 IU/mL

## 2019-04-11 LAB — GLUCOSE, CAPILLARY
Glucose-Capillary: 122 mg/dL — ABNORMAL HIGH (ref 70–99)
Glucose-Capillary: 189 mg/dL — ABNORMAL HIGH (ref 70–99)
Glucose-Capillary: 159 mg/dL — ABNORMAL HIGH (ref 70–99)
Glucose-Capillary: 174 mg/dL — ABNORMAL HIGH (ref 70–99)

## 2019-04-11 LAB — PHOSPHORUS: Phosphorus: 3.1 mg/dL (ref 2.5–4.6)

## 2019-04-11 NOTE — Progress Notes (Signed)
PROGRESS NOTE    Jonathan [name]  KJZ:[id number] DOB: 07-Aug-1971 DOA: 02/23/2019 PCP: Patient, No Pcp Per  Brief Narrative:48 year old Spanish-speaking male from [place], was admitted on 4/21 with increased respiratory distress, he was intubated on admission for ARDS due to SARS COVID-19 infection. -Unfortunately developed severe ATN required CRRT, prolonged respiratory failure , was extubated and reintubated earlier this month, subsequently underwent tracheostomy on 5/12 , followed by placement of a permacath on 5/13 -Ventrally liberated off the ventilator 5/21 however remained anuric requiring intermittent hemodialysis subsequently transferred to [facility] 5/26 from [facility]. -Was also started on anticoagulation while in ICU for suspected pulmonary embolism -5/29 passed swallow evaluation, his core track was removed and he was started on dysphagia diet with nectar thick liquids, last HD 5/30,hopeful for renal recovery -Repeat COVID tested negative x2 on 5/29 and 5/30 Events: 02/23/2019 admitted to the ICU. 02/24/2019 started on CRRT 4/21-4/24 Proned daily 4/28 -4/30 proned daily  03/04/2019 increase in oxygen demand required paralytic for 24 hours. 03/09/2019 hematochezia from gastric aspiration 5/11 extubated 5/12 re-intubated, tracheostomy 5/13 perm cath 5/14 worsening hypoxemia, shock suddenly, RV dilated, no new CXR changes, treated for PE empirically 5/21 doing t-piece trials 5/23: Trial off of CVVHD 5/24: 2 units PRBC 5/25 remains off of ventilator support since May 21, remains anuric, 5/26 patient transferred to [facility] from [facility] as he needed intermittent hemodialysis  5/29 passed modified swallow eval 5/30 started on nectar thick liquis and taking pills by mouth,dialysis Lines and tubes: 4/21 - ETT >>5.11.2020 4/21 - RIJ CVC, removed 4/22 L IJ HD Cath> removed 5/13 Permacath right 6/6 [facility] DCD  Antibiotics: 02/23/2019-425 2020 azithromycin and Rocephin  02/23/2019-02/24/2019 03/06/2019 Rocephin 5/11-5/17Vancomycin 5/12-5/18Zosyn 5/12-5/17Fluconazole for candida in tracheal aspirate and recent Actemra I took over his care 04/07/2019. Overnight patient had an episode of bleeding from the pressure ulcer on his face which needed epi and lidocaine by ENT. Lovenox was stopped. He is on Lovenox for presumed pulmonary embolism.  [place] taken out 04/10/2019. Assessment & Plan:   Active Problems:   ARDS (adult respiratory distress syndrome) (HCC)   Pressure injury of skin   Acute respiratory failure with hypoxia ([place])   COVID-19   AKI (acute kidney injury) ([place])   ATN (acute tubular necrosis) ([place])   Tracheostomy care ([place])   On tube feeding diet   Status post tracheostomy ([place])   History of ETT   Encounter for central line placement  Acute Hypoxic Resp. Failure due to Acute Covid 19 Viral Illness/ARDS/septic shock  -Treated at [facility] with IV steroids, IV Actemra on 4/22, -Also received convalescent plasma on 5/3 -Status post tracheostomy 5/12 -Weaned off the ventilator 5/21 -Still has moderate O2 requirement with FiO2 of 60% -Chest x-ray 5/30 showed low lung volumes and patchy airspace disease which was improving -Complicated by ATN, anuria initially was on CRRT, now on intermittent hemodialysis, hoping for renal recovery -CCM following intermittently for trach care, trach was downsized to #4 uncuffed [place] on 5/26. Patient remains on 8 L/35% FiO2of oxygen saturation 94%. Hopefully capping trial this week with early decannulation. -CIRconsulted and foll  Presumed acute PE -On 5/14 patient had a sudden decline with shock, worsening hypoxemia and echocardiogram which showed right heart strain -CT angiogram could not be obtained due to severe AKI -He was started on IV heparin for presumed PE, Dopplers were negative -3 months of anticoagulation recommended -Lovenox has been restarted 04/08/2019 since he did not have any further active  bleeding.  Acute kidney injury due  to ATN/Covid -Baseline creatinine was normal 2 months ago, he started CRRT on 4/21 and transition to intermittent hemodialysis 5/28, was undergoing HD Tuesday Thursday Saturday via TDC,mild improvement in urine output noted with 400 cc out put ,labsimproving creatinine 2.00 potassium 3.5 BUN 63.!! Nephrology signed off 04/11/2019  Anemia due to critical illness-he had small amount of bleeding from the tracheostomy site received 3 units of blood transfusion overall during this hospital stay.  His hemoglobin today 7.8 monitor closely.  He also had bleeding from the pressure ulcer on the left side of his face which has been resolved.  Dysphagia -Insetting of subacute respiratory failure, tracheostomy -SLPrecommends dysphagia 1 puree nectar thick liquid.  Hyperglycemia -No clear history of diabetes, hemoglobin A1c was 5.5 -Remains on Lantus 10 units twice daily and sliding scale insulin, CBGs are less than 170  Pressure injury on the skin of nose.bilateral cheeks Local wound care. -Appears to be healing  Bleeding facial wound one-point centimeter wound on the left cheek status post injection with 1% lidocaine and epi. If not healed in 1 week biopsy recommended by ENT.  DVT Prophylaxis:Lovenox  PUD Prophylaxis:On famotidine/ppi Code Status:Partial code Family Communication disposition Plan eventually discharged to CIR depending on renal recovery and trach capping trials, PMV and diet advancement.  Speech therapy following.  Pressure Injury 03/03/19 Face Right Unstageable - Full thickness tissue loss in which the base of the ulcer is covered by slough (yellow, tan, gray, green or brown) and/or eschar (tan, brown or black) in the wound bed. 2cm x2 1/4 cm  red edges  with blac (Active)  03/03/19 0830  Location: Face  Location Orientation: Right  Staging: Unstageable - Full thickness tissue loss in which the base of the ulcer is covered by slough  (yellow, tan, gray, green or brown) and/or eschar (tan, brown or black) in the wound bed.  Wound Description (Comments): 2cm x2 1/4 cm  red edges  with black cneter  Present on Admission: No     Pressure Injury 03/03/19 Face Left Unstageable - Full thickness tissue loss in which the base of the ulcer is covered by slough (yellow, tan, gray, green or brown) and/or eschar (tan, brown or black) in the wound bed. 1 1/2 cm x 2 1/2 cm red edges with y (Active)  03/03/19 0830  Location: Face  Location Orientation: Left  Staging: Unstageable - Full thickness tissue loss in which the base of the ulcer is covered by slough (yellow, tan, gray, green or brown) and/or eschar (tan, brown or black) in the wound bed.  Wound Description (Comments): 1 1/2 cm x 2 1/2 cm red edges with yellow center and black ring around yellow area  Present on Admission: No      Nutrition Problem: Inadequate oral intake Etiology: inability to eat     Signs/Symptoms: NPO status    Interventions: Nepro shake  Estimated body mass index is 23.66 kg/m as calculated from the following:   Height as of this encounter: 5\' 5"  (1.651 m).   Weight as of this encounter: 64.5 kg.   Subjective:  Patient is awake resting in bed happy that the dialysis catheter was taken out no nausea vomiting diarrhea Objective: Vitals:   04/11/19 0529 04/11/19 0718 04/11/19 0857 04/11/19 1126  BP: 121/78 118/82  118/77  Pulse: 98 94 88 97  Resp: (!) 22 16 18  (!) 28  Temp:  98.3 F (36.8 C)  97.6 F (36.4 C)  TempSrc:  Oral  Oral  SpO2: 96%  93% 94% 95%  Weight:      Height:        Intake/Output Summary (Last 24 hours) at 04/11/2019 1207 Last data filed at 04/11/2019 0719 Gross per 24 hour  Intake -  Output 750 ml  Net -750 ml   Filed Weights   04/07/19 0507 04/09/19 0500 04/10/19 0326  Weight: 62.5 kg 60.5 kg 64.5 kg    Examination:  General exam: Appears calm and comfortable  Respiratory system: Clear to auscultation.  Respiratory effort normal. Cardiovascular system: S1 & S2 heard, RRR. No JVD, murmurs, rubs, gallops or clicks. No pedal edema. Gastrointestinal system: Abdomen is nondistended, soft and nontender. No organomegaly or masses felt. Normal bowel sounds heard. Central nervous system: Alert and oriented. No focal neurological deficits. Extremities: Symmetric 5 x 5 power. Skin: No rashes, lesions or ulcers Psychiatry: Judgement and insight appear normal. Mood & affect appropriate.     Data Reviewed: I have personally reviewed following labs and imaging studies  CBC: Recent Labs  Lab 04/07/19 0939 04/07/19 2358 04/09/19 0549 04/10/19 0450 04/11/19 0649  WBC 8.0 18.8* 12.0* 10.4 10.8*  HGB 9.0* 8.4* 6.7* 7.7* 7.8*  HCT 28.4* 26.4* 21.6* 23.8* 24.5*  MCV 91.6 93.0 96.0 94.1 92.8  PLT 311 351 307 294 829   Basic Metabolic Panel: Recent Labs  Lab 04/07/19 0939 04/07/19 2358 04/09/19 0549 04/10/19 0450 04/11/19 0649  NA 134* 136 139 136 137  K 2.8* 3.8 4.0 3.5 3.5  CL 94* 98 106 102 103  CO2 26 24 24 24 25   GLUCOSE 221* 208* 131* 139* 114*  BUN 99* 106* 97* 63* 52*  CREATININE 4.25* 3.78* 3.02* 2.35* 2.00*  CALCIUM 11.2* 11.2* 10.5* 10.4* 10.3  PHOS 6.5* 4.7* 3.3 3.3 3.1   GFR: Estimated Creatinine Clearance: 39.3 mL/min (A) (by C-G formula based on SCr of 2 mg/dL (H)). Liver Function Tests: Recent Labs  Lab 04/07/19 0939 04/07/19 2358 04/09/19 0549 04/10/19 0450 04/11/19 0649  AST  --   --   --   --  24  ALT  --   --   --   --  29  ALKPHOS  --   --   --   --  117  BILITOT  --   --   --   --  0.5  PROT  --   --   --   --  6.6  ALBUMIN 3.0* 2.9* 2.8* 2.6* 2.7*   No results for input(s): LIPASE, AMYLASE in the last 168 hours. No results for input(s): AMMONIA in the last 168 hours. Coagulation Profile: Recent Labs  Lab 04/07/19 2358  INR 1.1   Cardiac Enzymes: No results for input(s): CKTOTAL, CKMB, CKMBINDEX, TROPONINI in the last 168 hours. BNP (last 3  results) No results for input(s): PROBNP in the last 8760 hours. HbA1C: No results for input(s): HGBA1C in the last 72 hours. CBG: Recent Labs  Lab 04/10/19 1705 04/10/19 2002 04/10/19 2342 04/11/19 0716 04/11/19 1128  GLUCAP 253* 152* 131* 122* 189*   Lipid Profile: No results for input(s): CHOL, HDL, LDLCALC, TRIG, CHOLHDL, LDLDIRECT in the last 72 hours. Thyroid Function Tests: No results for input(s): TSH, T4TOTAL, FREET4, T3FREE, THYROIDAB in the last 72 hours. Anemia Panel: No results for input(s): VITAMINB12, FOLATE, FERRITIN, TIBC, IRON, RETICCTPCT in the last 72 hours. Sepsis Labs: No results for input(s): PROCALCITON, LATICACIDVEN in the last 168 hours.  Recent Results (from the past 240 hour(s))  SARS Coronavirus 2 (CEPHEID- Performed in  [facility] hospital lab), Hosp Order     Status: None   Collection Time: 04/02/19 12:40 PM  Result Value Ref Range Status   SARS Coronavirus 2 NEGATIVE NEGATIVE Final    Comment: (NOTE) If result is NEGATIVE SARS-CoV-2 target nucleic acids are NOT DETECTED. The SARS-CoV-2 RNA is generally detectable in upper and lower  respiratory specimens during the acute phase of infection. The lowest  concentration of SARS-CoV-2 viral copies this assay can detect is 250  copies / mL. A negative result does not preclude SARS-CoV-2 infection  and should not be used as the sole basis for treatment or other  patient management decisions.  A negative result may occur with  improper specimen collection / handling, submission of specimen other  than nasopharyngeal swab, presence of viral mutation(s) within the  areas targeted by this assay, and inadequate number of viral copies  (<250 copies / mL). A negative result must be combined with clinical  observations, patient history, and epidemiological information. If result is POSITIVE SARS-CoV-2 target nucleic acids are DETECTED. The SARS-CoV-2 RNA is generally detectable in upper and lower   respiratory specimens dur ing the acute phase of infection.  Positive  results are indicative of active infection with SARS-CoV-2.  Clinical  correlation with patient history and other diagnostic information is  necessary to determine patient infection status.  Positive results do  not rule out bacterial infection or co-infection with other viruses. If result is PRESUMPTIVE POSTIVE SARS-CoV-2 nucleic acids MAY BE PRESENT.   A presumptive positive result was obtained on the submitted specimen  and confirmed on repeat testing.  While 2019 novel coronavirus  (SARS-CoV-2) nucleic acids may be present in the submitted sample  additional confirmatory testing may be necessary for epidemiological  and / or clinical management purposes  to differentiate between  SARS-CoV-2 and other Sarbecovirus currently known to infect humans.  If clinically indicated additional testing with an alternate test  methodology [phone]) is advised. The SARS-CoV-2 RNA is generally  detectable in upper and lower respiratory sp ecimens during the acute  phase of infection. The expected result is Negative. Fact Sheet for Patients:  StrictlyIdeas.no Fact Sheet for Healthcare Providers: BankingDealers.co.za This test is not yet approved or cleared by the [place] FDA and has been authorized for detection and/or diagnosis of SARS-CoV-2 by FDA under an Emergency Use Authorization (EUA).  This EUA will remain in effect (meaning this test can be used) for the duration of the COVID-19 declaration under Section 564(b)(1) of the Act, 21 U.S.C. section 360bbb-3(b)(1), unless the authorization is terminated or revoked sooner. Performed at [facility], [place] [address] [place], [place] [address]   SARS Coronavirus 2 (CEPHEID- Performed in [place] hospital lab), Hosp Order     Status: None   Collection Time: 04/03/19 11:28 AM  Result Value Ref Range Status   SARS  Coronavirus 2 NEGATIVE NEGATIVE Final    Comment: (NOTE) If result is NEGATIVE SARS-CoV-2 target nucleic acids are NOT DETECTED. The SARS-CoV-2 RNA is generally detectable in upper and lower  respiratory specimens during the acute phase of infection. The lowest  concentration of SARS-CoV-2 viral copies this assay can detect is 250  copies / mL. A negative result does not preclude SARS-CoV-2 infection  and should not be used as the sole basis for treatment or other  patient management decisions.  A negative result may occur with  improper specimen collection / handling, submission of specimen other  than nasopharyngeal swab, presence  of viral mutation(s) within the  areas targeted by this assay, and inadequate number of viral copies  (<250 copies / mL). A negative result must be combined with clinical  observations, patient history, and epidemiological information. If result is POSITIVE SARS-CoV-2 target nucleic acids are DETECTED. The SARS-CoV-2 RNA is generally detectable in upper and lower  respiratory specimens dur ing the acute phase of infection.  Positive  results are indicative of active infection with SARS-CoV-2.  Clinical  correlation with patient history and other diagnostic information is  necessary to determine patient infection status.  Positive results do  not rule out bacterial infection or co-infection with other viruses. If result is PRESUMPTIVE POSTIVE SARS-CoV-2 nucleic acids MAY BE PRESENT.   A presumptive positive result was obtained on the submitted specimen  and confirmed on repeat testing.  While 2019 novel coronavirus  (SARS-CoV-2) nucleic acids may be present in the submitted sample  additional confirmatory testing may be necessary for epidemiological  and / or clinical management purposes  to differentiate between  SARS-CoV-2 and other Sarbecovirus currently known to infect humans.  If clinically indicated additional testing with an alternate test   methodology [phone]) is advised. The SARS-CoV-2 RNA is generally  detectable in upper and lower respiratory sp ecimens during the acute  phase of infection. The expected result is Negative. Fact Sheet for Patients:  StrictlyIdeas.no Fact Sheet for Healthcare Providers: BankingDealers.co.za This test is not yet approved or cleared by the [place] FDA and has been authorized for detection and/or diagnosis of SARS-CoV-2 by FDA under an Emergency Use Authorization (EUA).  This EUA will remain in effect (meaning this test can be used) for the duration of the COVID-19 declaration under Section 564(b)(1) of the Act, 21 U.S.C. section 360bbb-3(b)(1), unless the authorization is terminated or revoked sooner. Performed at [facility], [place] [address] [place], [place] [address]          Radiology Studies: No results found.      Scheduled Meds: . chlorhexidine  15 mL Mouth Rinse BID  . Chlorhexidine Gluconate Cloth  6 each Topical Q0600  . clonazepam  0.25 mg Oral Daily  . darbepoetin (ARANESP) injection - NON-DIALYSIS  100 mcg Subcutaneous Q Sat-1800  . enoxaparin (LOVENOX) injection  60 mg Subcutaneous Q24H  . feeding supplement (NEPRO CARB STEADY)  237 mL Oral BID BM  . feeding supplement (PRO-STAT SUGAR FREE 64)  30 mL Oral TID  . free water  100 mL Oral Q8H  . guaiFENesin  5 mL Oral BID  . insulin aspart  0-9 Units Subcutaneous TID WC  . insulin aspart  6 Units Subcutaneous TID WC  . insulin glargine  12 Units Subcutaneous BID  . mouth rinse  15 mL Mouth Rinse q12n4p  . mupirocin cream   Topical BID  . pantoprazole  40 mg Oral BID  . senna-docusate  1 tablet Oral BID  . sodium chloride flush  10-40 mL Intracatheter Q12H   Continuous Infusions: . sodium chloride    . sodium chloride Stopped (03/21/19 2128)     LOS: 98 days     Georgette Shell, MD [facility] If 7PM-7AM, please contact  night-coverage [email or web address] Password [facility] 04/11/2019, 12:07 PM

## 2019-04-11 NOTE — Progress Notes (Signed)
Patient ID: Jonathan [name], male   DOB: 06-27-1971, 48 y.o.   MRN: [id number] S: at least 400 of UOP - crt and BUN both down again- [facility] has been removed.     O:BP 118/82 (BP Location: Left Arm)   Pulse 88   Temp 98.3 F (36.8 C) (Oral)   Resp 18   Ht 5\' 5"  (1.651 m)   Wt 64.5 kg   SpO2 94%   BMI 23.66 kg/m   Intake/Output Summary (Last 24 hours) at 04/11/2019 7673 Last data filed at 04/11/2019 0719 Gross per 24 hour  Intake -  Output 750 ml  Net -750 ml   Intake/Output: I/O last 3 completed shifts: In: -  Out: 1300 [Urine:1300]  Intake/Output this shift:  Total I/O In: -  Out: 350 [Urine:350] Weight change:   gen-  Alert, trach- spanish speaking- some coughing- looks better today  Lungs- CBS bilat  abd- soft, non tender Ext no peripheral edema    Recent Labs  Lab 04/05/19 0555 04/06/19 0435 04/07/19 0939 04/07/19 2358 04/09/19 0549 04/10/19 0450 04/11/19 0649  NA 136 137 134* 136 139 136 137  K 3.4* 3.3* 2.8* 3.8 4.0 3.5 3.5  CL 97* 96* 94* 98 106 102 103  CO2 24 26 26 24 24 24 25   GLUCOSE 138* 103* 221* 208* 131* 139* 114*  BUN 84* 95* 99* 106* 97* 63* 52*  CREATININE 5.41* 5.17* 4.25* 3.78* 3.02* 2.35* 2.00*  ALBUMIN 3.1* 2.9* 3.0* 2.9* 2.8* 2.6* 2.7*  CALCIUM 10.8* 10.9* 11.2* 11.2* 10.5* 10.4* 10.3  PHOS 8.3* 7.6* 6.5* 4.7* 3.3 3.3 3.1  AST  --   --   --   --   --   --  24  ALT  --   --   --   --   --   --  29   Liver Function Tests: Recent Labs  Lab 04/09/19 0549 04/10/19 0450 04/11/19 0649  AST  --   --  24  ALT  --   --  29  ALKPHOS  --   --  117  BILITOT  --   --  0.5  PROT  --   --  6.6  ALBUMIN 2.8* 2.6* 2.7*   No results for input(s): LIPASE, AMYLASE in the last 168 hours. No results for input(s): AMMONIA in the last 168 hours. CBC: Recent Labs  Lab 04/07/19 0939 04/07/19 2358 04/09/19 0549 04/10/19 0450 04/11/19 0649  WBC 8.0 18.8* 12.0* 10.4 10.8*  HGB 9.0* 8.4* 6.7* 7.7* 7.8*  HCT 28.4* 26.4* 21.6* 23.8* 24.5*  MCV 91.6  93.0 96.0 94.1 92.8  PLT 311 351 307 294 325   Cardiac Enzymes: No results for input(s): CKTOTAL, CKMB, CKMBINDEX, TROPONINI in the last 168 hours. CBG: Recent Labs  Lab 04/10/19 1119 04/10/19 1705 04/10/19 2002 04/10/19 2342 04/11/19 0716  GLUCAP 193* 253* 152* 131* 122*    Iron Studies: No results for input(s): IRON, TIBC, TRANSFERRIN, FERRITIN in the last 72 hours. Studies/Results: No results found. . chlorhexidine  15 mL Mouth Rinse BID  . Chlorhexidine Gluconate Cloth  6 each Topical Q0600  . clonazepam  0.25 mg Oral Daily  . darbepoetin (ARANESP) injection - NON-DIALYSIS  100 mcg Subcutaneous Q Sat-1800  . enoxaparin (LOVENOX) injection  60 mg Subcutaneous Q24H  . feeding supplement (NEPRO CARB STEADY)  237 mL Oral BID BM  . feeding supplement (PRO-STAT SUGAR FREE 64)  30 mL Oral TID  . free water  100  mL Oral Q8H  . guaiFENesin  5 mL Oral BID  . insulin aspart  0-9 Units Subcutaneous TID WC  . insulin aspart  6 Units Subcutaneous TID WC  . insulin glargine  12 Units Subcutaneous BID  . mouth rinse  15 mL Mouth Rinse q12n4p  . mupirocin cream   Topical BID  . pantoprazole  40 mg Oral BID  . senna-docusate  1 tablet Oral BID  . sodium chloride flush  10-40 mL Intracatheter Q12H    BMET    Component Value Date/Time   NA 137 04/11/2019 0649   K 3.5 04/11/2019 0649   CL 103 04/11/2019 0649   CO2 25 04/11/2019 0649   GLUCOSE 114 (H) 04/11/2019 0649   BUN 52 (H) 04/11/2019 0649   CREATININE 2.00 (H) 04/11/2019 0649   CALCIUM 10.3 04/11/2019 0649   GFRNONAA 38 (L) 04/11/2019 0649   GFRAA 44 (L) 04/11/2019 0649   CBC    Component Value Date/Time   WBC 10.8 (H) 04/11/2019 0649   RBC 2.64 (L) 04/11/2019 0649   HGB 7.8 (L) 04/11/2019 0649   HCT 24.5 (L) 04/11/2019 0649   PLT 325 04/11/2019 0649   MCV 92.8 04/11/2019 0649   MCH 29.5 04/11/2019 0649   MCHC 31.8 04/11/2019 0649   RDW 14.1 04/11/2019 0649   LYMPHSABS 3.1 03/14/2019 1206   MONOABS 2.2 (H)  03/14/2019 1206   EOSABS 0.3 03/14/2019 1206   BASOSABS 0.2 (H) 03/14/2019 1206   Brief HPI: Jonathan [name] is a 48 y.o. yo male who was admitted on 02/23/2019 with anuric AKI due to ATN in the setting of COVID   Assessment/Plan:  1. AKI in setting of covid-19 infection.  Crt 0.95 2 mos ago. Started on CVVHD on 02/23/19 and transitioned to IHD on 04/01/19- doing HD TTS via TDC.  starting to have an increase in UOP.  Last HD Sat 5/30 and kept even. Initial crt rise but now went down now several days in a row- crt in the 2's-  TDC removed   2. Covid-  Improving- now with 2 negative tests- out of isolation 3. Acute hypoxic respiratory failure s/p trach and off of vent since 03/25/19- still is an issue  4. Anemia- due to acute illness. S/p transfusion on 03/28/19 and started on Aranesp 03/27/19- another unit 6/5  5. Hypercalcemia-   Possibly due to immobilization. Phos was up but getting better , no binder - calc and phos both better today  6. Dispo- difficult, no insurance.  Does not need CLIP process as  recovering renal function  6. HTN/volume-  - on no BP meds - BP OK- some tachy- stopped lasix - gave fluid on 6/4, got blood on 6/5-  I think may have a tendency to be dry with insensible losses - keep in mind  7. Hypokalemia- is better   Due to high consult volume renal will sign off- hope for continued renal recovery-- can cont aranesp while here.  Call with questions.  Hoping he will not need nephrology specific follow up after discharge    [name]

## 2019-04-11 NOTE — Progress Notes (Signed)
[name] for Lovenox Indication: suspected PE  Patient Measurements: Height: 5\' 5"  (165.1 cm) Weight: 142 lb 3.2 oz (64.5 kg) IBW/kg (Calculated) : 61.5  Vital Signs: Temp: 98.3 F (36.8 C) (06/07 0718) Temp Source: Oral (06/07 0718) BP: 118/82 (06/07 0718) Pulse Rate: 94 (06/07 0718)  Labs: Recent Labs    04/09/19 0549 04/10/19 0450 04/11/19 0649  HGB 6.7* 7.7* 7.8*  HCT 21.6* 23.8* 24.5*  PLT 307 294 325  CREATININE 3.02* 2.35* 2.00*   Estimated Creatinine Clearance: 39.3 mL/min (A) (by C-G formula based on SCr of 2 mg/dL (H)).  Medical History: No past medical history on file.  Assessment: 11 YOM admitted on 02/23/2019 with ARDS due to COVID-19 who was on IV heparin for an extended period of time and transitioned to apixaban on 5/23 for r/o PE. Patient then experienced a drop in Hgb on 5/24 requiring 2u PRBCs. Apixaban was stopped but with Hgb remaining stable with no s/sx of overt bleeding, IV heparin resumed 5/25. Patient fell in room the evening of 5/26, and heparin stopped at 2300. CT head negative; thus heparin resumed 5/27 ~ 0200. On 5/30, IV heparin transitioned to SQ Lovenox. Pharmacy consulted for dosing. Note, patient developed AKI requiring CRRT, then IHD.    Significant event: 6/3: Anti-Xa level = 1 unit/mL, therapeutic  Patient had an episode of bleeding from the pressure ulcer on his face which needed epi and lidocaine by ENT. Lovenox was stopped and protamine given  6/4: No further bleeding today, so lovenox being resumed per MD orders   Today, 04/11/19:  SCr decr to 2.0. Per nephrology, holding on further HD for now.  CBC: 9 > 7.8, transfused 1 unit PRBC 6/5     Goal of Therapy:  Anti-Xa level 0.6-1 units/ml 4hrs after LMWH dose given Monitor platelets by anticoagulation protocol: Yes    Plan:  Lovenox 1 mg/kg (60mg ) SQ q24h for CrCl < 30 ml/min  Repeat anti-Xa level at 8pm, dose ordered for  4p  Monitor CBC, renal function, and for s/sx of bleeding   Minda Ditto PharmD Clinical Pharmacist [phone] Please check AMION for all [place] numbers 04/08/2019 5:03 PM

## 2019-04-11 NOTE — Progress Notes (Signed)
[place] for Lovenox Indication: suspected PE  Patient Measurements: Height: 5\' 5"  (165.1 cm) Weight: 142 lb 3.2 oz (64.5 kg) IBW/kg (Calculated) : 61.5  Vital Signs: Temp: 99.7 F (37.6 C) (06/07 1937) Temp Source: Oral (06/07 1937) BP: 128/78 (06/07 1937) Pulse Rate: 122 (06/07 2033)  Labs: Recent Labs    04/09/19 0549 04/10/19 0450 04/11/19 0649 04/11/19 2204  HGB 6.7* 7.7* 7.8*  --   HCT 21.6* 23.8* 24.5*  --   PLT 307 294 325  --   HEPRLOWMOCWT  --   --   --  0.66  CREATININE 3.02* 2.35* 2.00*  --    Estimated Creatinine Clearance: 39.3 mL/min (A) (by C-G formula based on SCr of 2 mg/dL (H)).  Medical History: No past medical history on file.  Assessment: 30 YOM admitted on 02/23/2019 with ARDS due to COVID-19 who was on IV heparin for an extended period of time and transitioned to apixaban on 5/23 for r/o PE. Patient then experienced a drop in Hgb on 5/24 requiring 2u PRBCs. Apixaban was stopped but with Hgb remaining stable with no s/sx of overt bleeding, IV heparin resumed 5/25. Patient fell in room the evening of 5/26, and heparin stopped at 2300. CT head negative; thus heparin resumed 5/27 ~ 0200. On 5/30, IV heparin transitioned to SQ Lovenox. Pharmacy consulted for dosing. Note, patient developed AKI requiring CRRT, then IHD.    Significant event: 6/3: Anti-Xa level = 1 unit/mL, therapeutic  Patient had an episode of bleeding from the pressure ulcer on his face which needed epi and lidocaine by ENT. Lovenox was stopped and protamine given  6/4: No further bleeding today, so lovenox being resumed per MD orders   Today, 04/11/19:  SCr decr to 2.0. Per nephrology, holding on further HD for now.  CBC: 9 > 7.8, transfused 1 unit PRBC 6/5   04/11/2019 11:02 PM Update:  Low molecular weight heparin level drawn 5 hours after Lovenox dose was therapeutic at 0.66   Goal of Therapy:  Anti-Xa level 0.6-1 units/ml 4hrs after  LMWH dose given Monitor platelets by anticoagulation protocol: Yes    Plan:  Would keep Lovenox dose at 60 mg q24h for now  Re-check low molecular weight heparin level in 2-3 days if renal function continues to improve to see if dose needs to be switched to q12h  Watch Hgb closely, multiple bleeding episodes in the recent past  Narda Bonds, PharmD, [place] Pharmacist Phone: [phone]

## 2019-04-12 ENCOUNTER — Inpatient Hospital Stay (HOSPITAL_COMMUNITY): Payer: HRSA Program

## 2019-04-12 DIAGNOSIS — J9601 Acute respiratory failure with hypoxia: Secondary | ICD-10-CM

## 2019-04-12 DIAGNOSIS — J9602 Acute respiratory failure with hypercapnia: Secondary | ICD-10-CM

## 2019-04-12 LAB — GLUCOSE, CAPILLARY
Glucose-Capillary: 105 mg/dL — ABNORMAL HIGH (ref 70–99)
Glucose-Capillary: 133 mg/dL — ABNORMAL HIGH (ref 70–99)
Glucose-Capillary: 144 mg/dL — ABNORMAL HIGH (ref 70–99)
Glucose-Capillary: 155 mg/dL — ABNORMAL HIGH (ref 70–99)
Glucose-Capillary: 259 mg/dL — ABNORMAL HIGH (ref 70–99)

## 2019-04-12 LAB — COMPREHENSIVE METABOLIC PANEL
ALT: 29 U/L (ref 0–44)
AST: 23 U/L (ref 15–41)
Albumin: 2.8 g/dL — ABNORMAL LOW (ref 3.5–5.0)
Alkaline Phosphatase: 128 U/L — ABNORMAL HIGH (ref 38–126)
Anion gap: 10 (ref 5–15)
BUN: 42 mg/dL — ABNORMAL HIGH (ref 6–20)
CO2: 22 mmol/L (ref 22–32)
Calcium: 10 mg/dL (ref 8.9–10.3)
Chloride: 102 mmol/L (ref 98–111)
Creatinine, Ser: 1.71 mg/dL — ABNORMAL HIGH (ref 0.61–1.24)
GFR calc Af Amer: 54 mL/min — ABNORMAL LOW (ref >60)
GFR calc non Af Amer: 46 mL/min — ABNORMAL LOW (ref >60)
Glucose, Bld: 227 mg/dL — ABNORMAL HIGH (ref 70–99)
Potassium: 3 mmol/L — ABNORMAL LOW (ref 3.5–5.1)
Sodium: 134 mmol/L — ABNORMAL LOW (ref 135–145)
Total Bilirubin: 0.4 mg/dL (ref 0.3–1.2)
Total Protein: 6.8 g/dL (ref 6.5–8.1)

## 2019-04-12 LAB — CBC
HCT: 25.6 % — ABNORMAL LOW (ref 39.0–52.0)
Hemoglobin: 8.1 g/dL — ABNORMAL LOW (ref 13.0–17.0)
MCH: 30 pg (ref 26.0–34.0)
MCHC: 31.6 g/dL (ref 30.0–36.0)
MCV: 94.8 fL (ref 80.0–100.0)
Platelets: 338 10*3/uL (ref 150–400)
RBC: 2.7 MIL/uL — ABNORMAL LOW (ref 4.22–5.81)
RDW: 14.2 % (ref 11.5–15.5)
WBC: 8.7 10*3/uL (ref 4.0–10.5)
nRBC: 0 % (ref 0.0–0.2)

## 2019-04-12 LAB — PHOSPHORUS: Phosphorus: 3.4 mg/dL (ref 2.5–4.6)

## 2019-04-12 MED ORDER — DARBEPOETIN ALFA 100 MCG/0.5ML IJ SOSY
100.0000 ug | PREFILLED_SYRINGE | INTRAMUSCULAR | Status: DC
  Administered 2019-04-12: 100 ug via SUBCUTANEOUS
  Filled 2019-04-12: qty 0.5

## 2019-04-12 MED ORDER — ENOXAPARIN SODIUM 60 MG/0.6ML ~~LOC~~ SOLN
60.0000 mg | Freq: Two times a day (BID) | SUBCUTANEOUS | Status: DC
  Administered 2019-04-12 – 2019-04-19 (×14): 60 mg via SUBCUTANEOUS
  Filled 2019-04-12 (×14): qty 0.6

## 2019-04-12 NOTE — Progress Notes (Addendum)
Inpatient Rehabilitation Admissions Coordinator  I met with patient at bedside with Graciella. I discussed the possibility of an inpt rehab admission with goals of eventual decannulation prior to d/c back to [facility] or to the [facility] in [place], [place]. He is in agreement. Hopeful for admit to CIR soon.  Danne Baxter, RN, MSN Rehab Admissions Coordinator [phone] 04/12/2019 10:47 AM  I contacted [name], employer, to update on pt's progress and to verify that patient could return to [facility] upon d/c to CIR. She is in agreement.

## 2019-04-12 NOTE — Progress Notes (Signed)
FiO2 increased due to increased RR, HR AND DECREASING SaO2. RN and MD notified

## 2019-04-12 NOTE — Progress Notes (Signed)
Modified Barium Swallow Progress Note  Patient Details  Name: Rodarius Kichline MRN: [id number] Date of Birth: May 30, 1971  Today's Date: 04/12/2019  Modified Barium Swallow completed.  Full report located under Chart Review in the Imaging Section.  Brief recommendations include the following:  Clinical Impression  Paitent presents with a signfiicantly improved swallow as compared to MBS on 5/26. He did not exhibit any penetration or aspiration with any of the tested boluses (thin, nectar, puree, regular, tablet)and UES opening and bolus transit was [facility]. The only deficit noted was trace-mild vallecular residuals post swallows of puree and regular solids, but with full clearance from pharynx post subsequent swallows. PMV was in place during entire test.   Swallow Evaluation Recommendations       SLP Diet Recommendations: Regular solids;Thin liquid   Liquid Administration via: Straw;Cup   Medication Administration: Whole meds with liquid   Supervision: Patient able to self feed;Intermittent supervision to cue for compensatory strategies   Compensations: Minimize environmental distractions;Slow rate;Small sips/bites   Postural Changes: Seated upright at 90 degrees   Oral Care Recommendations: Oral care BID   Other Recommendations: Place PMSV during PO intake    Nadara Mode Tarrell 04/12/2019,2:01 PM   Sonia Baller, MA, CCC-SLP Speech Therapy [facility] Acute Rehab Pager: [phone]

## 2019-04-12 NOTE — Progress Notes (Signed)
Respiratory Note:  1700: patient stated he was feeling short of breath, RR 26, HR 114-126, SaO2 on 35 % trach collar 92-94%. BS decreased and clear bilaterally. Did not RED Cap patient's trach at this time.  RN notified.

## 2019-04-12 NOTE — Progress Notes (Signed)
Inpatient Rehabilitation Admissions Coordinator  I have scheduled interpreter time at 1030 with Graciella to meet with patient to discuss his rehab options.  Danne Baxter, RN, MSN Rehab Admissions Coordinator [phone] 04/12/2019 9:22 AM

## 2019-04-12 NOTE — Progress Notes (Signed)
NAME:  Philipp Callegari, MRN:  [id number], DOB:  [birth year], LOS: 67 ADMISSION DATE:  02/23/2019, CONSULTATION DATE: February 23, 2019 REFERRING MD: Regenia Skeeter, CHIEF COMPLAINT: Dyspnea  Brief History   48 year old male admitted on February 23, 2019 with ARDS due to COVID-19.  He has largely recovered from severe ARDS which originally required prone positioning for many days.  Developed acute kidney injury requiring hemodialysis.  Has been liberated from the ventilator as of Mar 25, 2019.    Past Medical History  Diabetes mellitus type 2 Hypertension  Significant Hospital Events   4/21 admitted and immediately proned on inverse ratio PC ventilation with high PEEP to maintain saturation >85%, Started on NO at 40ppm. 4/22- Tocluzimab X 1 4/22- started CVVHD 4/21-4/24 Proned daily 4/28 -4/30 proned daily , tapered NO to off  4/30 -late evening with increased O2 demands . No paralytics required x 24hr . No vent dysyncrony . Calm on Fent /Versed. Off precedex   5/1 - Remains on CRRT , no acute issues overnight --4.8 L I/O Balance since admit . Continues to Prone on/off 16 on and 8 off . -CXR stable diffuse bilateral infiltrates (slightly improved aeration),  Vent .80Fio2 .  5/3 convalescent plasma  Biomarkes improving but severe ARDS persists ad D-dimer still very high (13) without improvement. On IV Heparin gtt. Growing MSSA. SEdation changed to dilaudid gtt. Continues prone 16h and 8h syupine. Currently supine at 80% fio2, peep 18. On TF. STARTED NIMBEX  5/4-5/5-able to stay in the supine position, able to discontinue paralysis and maintain FiO2 0.50, PEEP 8-10.  5/5 hematochezia, bloody gastric aspirate  5/8 followed commands  5/11 extubated  5/12 re-intubated, tracheostomy  5/13 perm cath  5/14 worsening hypoxemia, shock suddenly, RV dilated, no new CXR changes, treated for PE empirically  5/21 doing t-piece trials May 23: Trial off of CVVHD May 25 remains off of  ventilator support since May 21, remains anuric, plan for CVVHD again today may 26 thru 6/1: now s/p two negative COVID-19 tests. Working on rehab efforts, determining HD needs and disposition.   Consults:  PCCM 4/21  Procedures:  4/21 - ETT 7.5 >> 5/12 5/12 Trach >> 4/21 - RIJ CVC [facility] >>5/10 5/10 - CVC >> 5/13 4/22 L IJ HD Cath>5/15 5/13 R IJ Permcath> 6/8   Significant Diagnostic Tests:  POC U/S 4/21: Bilateral interstitial pattern with bilateral dense consolidation at bases. Echo shows low-normal LVSF. RV moderately dilated with septal shift.  5/14 Echo> LVEF > 65%, RV dilated, decreased RV function (moderate) with RVSP 60  Micro Data:  Blood cultures -PND SARS-CoV2 02/23/2019 - - POSITIVE HIV 4/21 -neg Quant gold 4/2 1- neg .....................Marland Kitchen resp 4/29 >>rare MSSA and yeast  5/10 blood > neg 5/18 c. Diff > neg  Anti-covid RX and antimicrobial RX  Azithromycin 4/21 >>4/25 Ceftriaxone 4/22 >>4/25 Actemra 4/22 Hydroxychloroquine 4/21 > 4/22 ........................... Ceftriaxone 5/2 >>5/10 Cefepime 5/10>>> 5/12 Zosyn 5/12 >>5/17 Vancomycin 5/10>>>5/17  Fluconazole 5/12 > 5/17   Interim history/subjective:  Remains off of mechanical ventilation Renal recovery No complaints via interpreter interview.   Objective   Blood pressure 129/86, pulse (!) 102, temperature 98.1 F (36.7 C), temperature source Oral, resp. rate 18, height 5\' 5"  (1.651 m), weight 64.2 kg, SpO2 96 %.    FiO2 (%):  [35 %] 35 %   Intake/Output Summary (Last 24 hours) at 04/12/2019 0951 Last data filed at 04/12/2019 0721 Gross per 24 hour  Intake 480 ml  Output 950 ml  Net -470 ml   Filed Weights   04/09/19 0500 04/10/19 0326 04/12/19 0349  Weight: 60.5 kg 64.5 kg 64.2 kg    Examination: General:  Thing adult male in NAD Neuro:  Alert, oriented, no JVD HEENT:  [place]/AT, No JVD noted, PERRL Cardiovascular:  RRR, no MRG Lungs:  Clear bilateral breath sounds Abdomen:   Soft, non-distended, non-tender Musculoskeletal:  No acute deformity or ROM limitation Skin:  Intact, MMM   Resolved Hospital Problem list     Assessment & Plan:  1) Suspected pulmonary embolism: Acute hypoxemia, RV dilation on echo 2) Acute respiratory failure with hypoxemia, recovering ARDS from COVID-19: COVID 19 testing now neg X 2 3) AKI (now off IHD) 4) Anemia  5) dysphagia  6) tracheostomy dependence s/p prolonged critical illness and difficulty weaning from mechanical vent (downsized to #4 cuffless 5/26) 7) severe deconditioning s/p prolonged critical illness  Discussion Seems to be doing well from trach stand-point. He is on minimal oxygen but still desaturates. Should eventually try to work towards decannulation; still seems deconditioned. Still on 35% FiO2, which can probably be weaned down at this point. Still requiring modified diet due to aspiration risks. SLP planning MBS today. He is very nearly ready to start capping trials, however, I worry about his overall weakness and aspiration potential.    Plan Continue current supportive care measures Cont PT/OT/SLP f/u Will re-eval 6/10 pending further SLP eval and futher PT/OT Hopefully he will be ready for red cap and working towards decannulation.    Georgann Housekeeper, AGACNP-BC [place] Pager [phone] or [phone]  04/12/2019 9:55 AM

## 2019-04-12 NOTE — Progress Notes (Signed)
[place] for Lovenox Indication: suspected PE  Patient Measurements: Height: 5\' 5"  (165.1 cm) Weight: 141 lb 8.6 oz (64.2 kg) IBW/kg (Calculated) : 61.5  Vital Signs: Temp: 98.1 F (36.7 C) (06/08 0717) Temp Source: Oral (06/08 0717) BP: 129/86 (06/08 0717) Pulse Rate: 102 (06/08 0835)  Labs: Recent Labs    04/10/19 0450 04/11/19 0649 04/11/19 2204 04/12/19 0902  HGB 7.7* 7.8*  --  8.1*  HCT 23.8* 24.5*  --  25.6*  PLT 294 325  --  338  HEPRLOWMOCWT  --   --  0.66  --   CREATININE 2.35* 2.00*  --  1.71*   Estimated Creatinine Clearance: 46 mL/min (A) (by C-G formula based on SCr of 1.71 mg/dL (H)).  Medical History: No past medical history on file.  Assessment: 29 YOM admitted on 02/23/2019 with ARDS due to COVID-19 who was on IV heparin for an extended period of time and transitioned to apixaban on 5/23 for r/o PE. Patient then experienced a drop in Hgb on 5/24 requiring 2u PRBCs. Apixaban was stopped but with Hgb remaining stable with no s/sx of overt bleeding, IV heparin resumed 5/25. Patient fell in room the evening of 5/26, and heparin stopped at 2300. CT head negative; thus heparin resumed 5/27 ~ 0200. On 5/30, IV heparin transitioned to SQ Lovenox. Pharmacy consulted for dosing. Note, patient developed AKI requiring CRRT, then IHD.   Scr improving  Goal of Therapy:  Anti-Xa level 0.6-1 units/ml 4hrs after LMWH dose given Monitor platelets by anticoagulation protocol: Yes    Plan: Increase Lovenox to 60 mg sq Q 12 Follow up CBC   Tad Moore PharmD Clinical Pharmacist [phone] Please check AMION for all [place] numbers 04/08/2019 5:03 PM

## 2019-04-12 NOTE — Progress Notes (Signed)
Inpatient Rehabilitation Admissions Coordinator  I discussed with Dr. Rodena Piety, we will plan admit to [facility] Tuesday.  Danne Baxter, RN, MSN Rehab Admissions Coordinator [phone] 04/12/2019 3:27 PM

## 2019-04-12 NOTE — Consult Note (Signed)
Secretary Nurse wound follow up Wound type:Full thickness device related pressure injuries to bilateral cheeks from proning with ETT while recovering from COVID-19.  No more  Measurement: LEft cheek remains necrotic, adherent today. Scarring from new epithelialization.  0.5 cm round eschar Right cheek eschar has sloughed away.  Pink new epithelialized tissue present.  Intact and healing.    Wound bed:see above Drainage (amount, consistency, odor) none Periwound: new epithelial tissue Dressing procedure/placement/frequency: Cleanse bilateral cheeks daily with soap and water and pat dry.  Scant amount of mupirocin ointment.  Open to air.  Will not follow at this time.  Please re-consult if needed.  Domenic Moras MSN, RN, FNP-BC [facility] Wound, Ostomy, Continence Nurse Pager [phone]

## 2019-04-12 NOTE — Progress Notes (Signed)
MD notified of pt's pulse 130s, breathing labored & increased need of oxygen.  Pt now on 10L & fio2 60%.  New orders received for stat Chest XR and PRN Lopressor.   Prn Lopressor administered with heart rate 137.  After administration heart rate down to 107.

## 2019-04-12 NOTE — Progress Notes (Signed)
PROGRESS NOTE    Lekeith Wulf  PYP:[id number] DOB: 28-Nov-1970 DOA: 02/23/2019 PCP: Patient, No Pcp Per    Brief Narrative:48 year old Spanish-speaking male from [place], was admitted on 4/21 with increased respiratory distress, he was intubated on admission for ARDS due to SARS COVID-19 infection. -Unfortunately developed severe ATN required CRRT, prolonged respiratory failure , was extubated and reintubated earlier this month, subsequently underwent tracheostomy on 5/12 , followed by placement of a permacath on 5/13 -Ventrally liberated off the ventilator 5/21 however remained anuric requiring intermittent hemodialysis subsequently transferred to [facility] 5/26 from [facility]. -Was also started on anticoagulation while in ICU for suspected pulmonary embolism -5/29 passed swallow evaluation, his core track was removed and he was started on dysphagia diet with nectar thick liquids, last HD 5/30,hopeful for renal recovery -Repeat COVID tested negative x2 on 5/29 and 5/30 Events: 02/23/2019 admitted to the ICU. 02/24/2019 started on CRRT 4/21-4/24 Proned daily 4/28 -4/30 proned daily  03/04/2019 increase in oxygen demand required paralytic for 24 hours. 03/09/2019 hematochezia from gastric aspiration 5/11 extubated 5/12 re-intubated, tracheostomy 5/13 perm cath 5/14 worsening hypoxemia, shock suddenly, RV dilated, no new CXR changes, treated for PE empirically 5/21 doing t-piece trials 5/23: Trial off of CVVHD 5/24: 2 units PRBC 5/25 remains off of ventilator support since May 21, remains anuric, 5/26 patient transferred to [facility] from [facility] as he needed intermittent hemodialysis  5/29 passed modified swallow eval 5/30 started on nectar thick liquis and taking pills by mouth,dialysis Lines and tubes: 4/21 - ETT >>5.11.2020 4/21 - RIJ CVC, removed 4/22 L IJ HD Cath> removed 5/13 Permacath right 6/6 [facility] DCD  Antibiotics: 02/23/2019-425 2020 azithromycin and Rocephin  02/23/2019-02/24/2019 03/06/2019 Rocephin 5/11-5/17Vancomycin 5/12-5/18Zosyn 5/12-5/17Fluconazole for candida in tracheal aspirate and recent Actemra I took over his care 04/07/2019. Overnight patient had an episode of bleeding from the pressure ulcer on his face which needed epi and lidocaine by ENT. Lovenox was stopped. He is on Lovenox for presumed pulmonary embolism.  [place] taken out 04/10/2019.  Recap last 7 days -patient remains on 35% FiO2 pulmonary following at a distance.  Repeat swallow evaluation modified barium swallow to be done 04/12/2019.  His renal functions improved so much that no more dialysis is needed temporary dialysis catheter was taken out 04/10/2019.  Patient has bilateral cheek ulcers from pressure injury from pronating while intubated when he had COVID pneumonia.  The right cheek has been healed.  The left cheek had bleeding profusely when ENT had to be consulted and they injected epi and lidocaine.  ENT recommends biopsy if it does not heal in 1 week.  However his left cheek wound is healing slowly.  He is also followed by CIR when patient is medically stable he will be discharged to CIR.  He is a Psychologist, sport and exercise.  His whole family is in [place]. [place]:   Active Problems:   ARDS (adult respiratory distress syndrome) (HCC)   Pressure injury of skin   Acute respiratory failure with hypoxia ([place])   COVID-19   AKI (acute kidney injury) ([place])   ATN (acute tubular necrosis) ([place])   Tracheostomy care ([place])   On tube feeding diet   Status post tracheostomy ([place])   History of ETT   Encounter for intubation  Acute Hypoxic Resp. Failure due to Acute Covid 19 Viral Illness/ARDS/septic shock  -[facility], IV Actemra on 4/22, -Also received convalescent plasma on 5/3 -Status post tracheostomy 5/12 -Weaned off the ventilator 5/21 -Still has  moderate O2 requirement with FiO2 of 35% -Chest x-ray 5/30 showed low lung volumes and patchy airspace disease  which was improving -CCM following intermittently for trach care, trach was downsized to #4 uncuffed [place] on 5/26. Patient remains on 8 L/35% FiO2of oxygen saturation 94%. Hopefully capping trial this week with early decannulation. -CIRconsulted and foll  Presumed acute PE -On 5/14 patient had a sudden decline with shock, worsening hypoxemia and echocardiogram which showed right heart strain -CT angiogram could not be obtained due to severe AKI -He was started on IV heparin for presumed PE, Dopplers were negative -3 months of anticoagulation recommended -Lovenox has been restarted 04/08/2019 since he did not have any further active bleeding.  Acute kidney injury due to ATN/Covid -Baseline creatinine was normal 2 months ago, he started CRRT on 4/21 and transition to intermittent hemodialysis 5/28, was undergoing HD Tuesday Thursday Saturday via TDC,his renal functions have significantly improved and dialysis has been stopped at this time. Nephrology signed off 04/11/2019  Anemia due to critical illness-he had small amount of bleeding from the tracheostomy site received 3 units of blood transfusion overall during this hospital stay. He also had bleeding from the pressure ulcer on the left side of his face which has been resolved.  Dysphagia -Insetting of subacute respiratory failure, tracheostomy -SLPrecommends dysphagia 1 puree nectar thick liquid.  Hyperglycemia -No clear history of diabetes, hemoglobin A1c was 5.5 -Remains on Lantus 10 units twice daily and sliding scale insulin, CBGs are less than 170  Pressure injury on the skin of nose.bilateral cheeks Local wound care.  With soap and water daily. -Appears to be healing  DVT Prophylaxis:Lovenox  PUD Prophylaxis:On famotidine/ppi Code Status:Partial code Family Communication discussed with his wife in [place] call interpreter [phone] Spero Geralds if you call her she will contact you to her family and interpret for  you. disposition Planeventually discharged to CIR depending  trach capping trials,PMV and diet advancement. Speech therapy following  Pressure Injury 03/03/19 Face Right Unstageable - Full thickness tissue loss in which the base of the ulcer is covered by slough (yellow, tan, gray, green or brown) and/or eschar (tan, brown or black) in the wound bed. 2cm x2 1/4 cm  red edges  with blac (Active)  03/03/19 0830  Location: Face  Location Orientation: Right  Staging: Unstageable - Full thickness tissue loss in which the base of the ulcer is covered by slough (yellow, tan, gray, green or brown) and/or eschar (tan, brown or black) in the wound bed.  Wound Description (Comments): 2cm x2 1/4 cm  red edges  with black cneter  Present on Admission: No     Pressure Injury 03/03/19 Face Left Unstageable - Full thickness tissue loss in which the base of the ulcer is covered by slough (yellow, tan, gray, green or brown) and/or eschar (tan, brown or black) in the wound bed. 1 1/2 cm x 2 1/2 cm red edges with y (Active)  03/03/19 0830  Location: Face  Location Orientation: Left  Staging: Unstageable - Full thickness tissue loss in which the base of the ulcer is covered by slough (yellow, tan, gray, green or brown) and/or eschar (tan, brown or black) in the wound bed.  Wound Description (Comments): 1 1/2 cm x 2 1/2 cm red edges with yellow center and black ring around yellow area  Present on Admission: No      Nutrition Problem: Inadequate oral intake Etiology: inability to eat     Signs/Symptoms: NPO status    Interventions: Nepro  shake  Estimated body mass index is 23.55 kg/m as calculated from the following:   Height as of this encounter: 5\' 5"  (1.651 m).   Weight as of this encounter: 64.2 kg.   Subjective: Patient resting in bed feels better denies any nausea vomiting diarrhea or pain  Objective: Vitals:   04/12/19 0500 04/12/19 0600 04/12/19 0717 04/12/19 0835  BP:   129/86    Pulse: 85 100 94 (!) 102  Resp: (!) 23 (!) 43 (!) 21 18  Temp:   98.1 F (36.7 C)   TempSrc:   Oral   SpO2: 98% 99% 93% 96%  Weight:      Height:        Intake/Output Summary (Last 24 hours) at 04/12/2019 1012 Last data filed at 04/12/2019 0721 Gross per 24 hour  Intake 480 ml  Output 950 ml  Net -470 ml   Filed Weights   04/09/19 0500 04/10/19 0326 04/12/19 0349  Weight: 60.5 kg 64.5 kg 64.2 kg    Examination:  General exam: Appears calm and comfortable  Respiratory system: clear to auscultation. Respiratory effort normal. Cardiovascular system: S1 & S2 heard, RRR. No JVD, murmurs, rubs, gallops or clicks. No pedal edema. Gastrointestinal system: Abdomen is nondistended, soft and nontender. No organomegaly or masses felt. Normal bowel sounds heard. Central nervous system: Alert and oriented. No focal neurological deficits. Extremities: Symmetric 5 x 5 power. Skin: No rashes, lesions or ulcers Psychiatry: Judgement and insight appear normal. Mood & affect appropriate.     Data Reviewed: I have personally reviewed following labs and imaging studies  CBC: Recent Labs  Lab 04/07/19 0939 04/07/19 2358 04/09/19 0549 04/10/19 0450 04/11/19 0649  WBC 8.0 18.8* 12.0* 10.4 10.8*  HGB 9.0* 8.4* 6.7* 7.7* 7.8*  HCT 28.4* 26.4* 21.6* 23.8* 24.5*  MCV 91.6 93.0 96.0 94.1 92.8  PLT 311 351 307 294 546   Basic Metabolic Panel: Recent Labs  Lab 04/07/19 0939 04/07/19 2358 04/09/19 0549 04/10/19 0450 04/11/19 0649  NA 134* 136 139 136 137  K 2.8* 3.8 4.0 3.5 3.5  CL 94* 98 106 102 103  CO2 26 24 24 24 25   GLUCOSE 221* 208* 131* 139* 114*  BUN 99* 106* 97* 63* 52*  CREATININE 4.25* 3.78* 3.02* 2.35* 2.00*  CALCIUM 11.2* 11.2* 10.5* 10.4* 10.3  PHOS 6.5* 4.7* 3.3 3.3 3.1   GFR: Estimated Creatinine Clearance: 39.3 mL/min (A) (by C-G formula based on SCr of 2 mg/dL (H)). Liver Function Tests: Recent Labs  Lab 04/07/19 0939 04/07/19 2358 04/09/19 0549 04/10/19  0450 04/11/19 0649  AST  --   --   --   --  24  ALT  --   --   --   --  29  ALKPHOS  --   --   --   --  117  BILITOT  --   --   --   --  0.5  PROT  --   --   --   --  6.6  ALBUMIN 3.0* 2.9* 2.8* 2.6* 2.7*   No results for input(s): LIPASE, AMYLASE in the last 168 hours. No results for input(s): AMMONIA in the last 168 hours. Coagulation Profile: Recent Labs  Lab 04/07/19 2358  INR 1.1   Cardiac Enzymes: No results for input(s): CKTOTAL, CKMB, CKMBINDEX, TROPONINI in the last 168 hours. BNP (last 3 results) No results for input(s): PROBNP in the last 8760 hours. HbA1C: No results for input(s): HGBA1C in the last 72  hours. CBG: Recent Labs  Lab 04/11/19 0716 04/11/19 1128 04/11/19 1608 04/11/19 1935 04/12/19 0719  GLUCAP 122* 189* 159* 174* 133*   Lipid Profile: No results for input(s): CHOL, HDL, LDLCALC, TRIG, CHOLHDL, LDLDIRECT in the last 72 hours. Thyroid Function Tests: No results for input(s): TSH, T4TOTAL, FREET4, T3FREE, THYROIDAB in the last 72 hours. Anemia Panel: No results for input(s): VITAMINB12, FOLATE, FERRITIN, TIBC, IRON, RETICCTPCT in the last 72 hours. Sepsis Labs: No results for input(s): PROCALCITON, LATICACIDVEN in the last 168 hours.  Recent Results (from the past 240 hour(s))  SARS Coronavirus 2 (CEPHEID- Performed in [place] hospital lab), Hosp Order     Status: None   Collection Time: 04/02/19 12:40 PM  Result Value Ref Range Status   SARS Coronavirus 2 NEGATIVE NEGATIVE Final    Comment: (NOTE) If result is NEGATIVE SARS-CoV-2 target nucleic acids are NOT DETECTED. The SARS-CoV-2 RNA is generally detectable in upper and lower  respiratory specimens during the acute phase of infection. The lowest  concentration of SARS-CoV-2 viral copies this assay can detect is 250  copies / mL. A negative result does not preclude SARS-CoV-2 infection  and should not be used as the sole basis for treatment or other  patient management decisions.   A negative result may occur with  improper specimen collection / handling, submission of specimen other  than nasopharyngeal swab, presence of viral mutation(s) within the  areas targeted by this assay, and inadequate number of viral copies  (<250 copies / mL). A negative result must be combined with clinical  observations, patient history, and epidemiological information. If result is POSITIVE SARS-CoV-2 target nucleic acids are DETECTED. The SARS-CoV-2 RNA is generally detectable in upper and lower  respiratory specimens dur ing the acute phase of infection.  Positive  results are indicative of active infection with SARS-CoV-2.  Clinical  correlation with patient history and other diagnostic information is  necessary to determine patient infection status.  Positive results do  not rule out bacterial infection or co-infection with other viruses. If result is PRESUMPTIVE POSTIVE SARS-CoV-2 nucleic acids MAY BE PRESENT.   A presumptive positive result was obtained on the submitted specimen  and confirmed on repeat testing.  While 2019 novel coronavirus  (SARS-CoV-2) nucleic acids may be present in the submitted sample  additional confirmatory testing may be necessary for epidemiological  and / or clinical management purposes  to differentiate between  SARS-CoV-2 and other Sarbecovirus currently known to infect humans.  If clinically indicated additional testing with an alternate test  methodology [phone]) is advised. The SARS-CoV-2 RNA is generally  detectable in upper and lower respiratory sp ecimens during the acute  phase of infection. The expected result is Negative. Fact Sheet for Patients:  StrictlyIdeas.no Fact Sheet for Healthcare Providers: BankingDealers.co.za This test is not yet approved or cleared by the [place] FDA and has been authorized for detection and/or diagnosis of SARS-CoV-2 by FDA under an Emergency Use  Authorization (EUA).  This EUA will remain in effect (meaning this test can be used) for the duration of the COVID-19 declaration under Section 564(b)(1) of the Act, 21 U.S.C. section 360bbb-3(b)(1), unless the authorization is terminated or revoked sooner. Performed at [facility], [place] [address] [place], [place] [address]   SARS Coronavirus 2 (CEPHEID- Performed in [facility] hospital lab), Hosp Order     Status: None   Collection Time: 04/03/19 11:28 AM  Result Value Ref Range Status   SARS Coronavirus 2  NEGATIVE NEGATIVE Final    Comment: (NOTE) If result is NEGATIVE SARS-CoV-2 target nucleic acids are NOT DETECTED. The SARS-CoV-2 RNA is generally detectable in upper and lower  respiratory specimens during the acute phase of infection. The lowest  concentration of SARS-CoV-2 viral copies this assay can detect is 250  copies / mL. A negative result does not preclude SARS-CoV-2 infection  and should not be used as the sole basis for treatment or other  patient management decisions.  A negative result may occur with  improper specimen collection / handling, submission of specimen other  than nasopharyngeal swab, presence of viral mutation(s) within the  areas targeted by this assay, and inadequate number of viral copies  (<250 copies / mL). A negative result must be combined with clinical  observations, patient history, and epidemiological information. If result is POSITIVE SARS-CoV-2 target nucleic acids are DETECTED. The SARS-CoV-2 RNA is generally detectable in upper and lower  respiratory specimens dur ing the acute phase of infection.  Positive  results are indicative of active infection with SARS-CoV-2.  Clinical  correlation with patient history and other diagnostic information is  necessary to determine patient infection status.  Positive results do  not rule out bacterial infection or co-infection with other viruses. If result is PRESUMPTIVE POSTIVE SARS-CoV-2  nucleic acids MAY BE PRESENT.   A presumptive positive result was obtained on the submitted specimen  and confirmed on repeat testing.  While 2019 novel coronavirus  (SARS-CoV-2) nucleic acids may be present in the submitted sample  additional confirmatory testing may be necessary for epidemiological  and / or clinical management purposes  to differentiate between  SARS-CoV-2 and other Sarbecovirus currently known to infect humans.  If clinically indicated additional testing with an alternate test  methodology [phone]) is advised. The SARS-CoV-2 RNA is generally  detectable in upper and lower respiratory sp ecimens during the acute  phase of infection. The expected result is Negative. Fact Sheet for Patients:  StrictlyIdeas.no Fact Sheet for Healthcare Providers: BankingDealers.co.za This test is not yet approved or cleared by the [place] FDA and has been authorized for detection and/or diagnosis of SARS-CoV-2 by FDA under an Emergency Use Authorization (EUA).  This EUA will remain in effect (meaning this test can be used) for the duration of the COVID-19 declaration under Section 564(b)(1) of the Act, 21 U.S.C. section 360bbb-3(b)(1), unless the authorization is terminated or revoked sooner. Performed at [facility], [place] [address] [place], [place] [address]          Radiology Studies: No results found.      Scheduled Meds: . chlorhexidine  15 mL Mouth Rinse BID  . Chlorhexidine Gluconate Cloth  6 each Topical Q0600  . clonazepam  0.25 mg Oral Daily  . darbepoetin (ARANESP) injection - NON-DIALYSIS  100 mcg Subcutaneous Q Mon-1800  . enoxaparin (LOVENOX) injection  60 mg Subcutaneous Q24H  . feeding supplement (NEPRO CARB STEADY)  237 mL Oral BID BM  . feeding supplement (PRO-STAT SUGAR FREE 64)  30 mL Oral TID  . guaiFENesin  5 mL Oral BID  . insulin aspart  0-9 Units Subcutaneous TID WC  . insulin aspart   6 Units Subcutaneous TID WC  . insulin glargine  12 Units Subcutaneous BID  . mouth rinse  15 mL Mouth Rinse q12n4p  . mupirocin cream   Topical BID  . pantoprazole  40 mg Oral BID  . senna-docusate  1 tablet Oral BID  . sodium chloride flush  10-40 mL Intracatheter Q12H   Continuous Infusions: . sodium chloride Stopped (03/21/19 2128)     LOS: 71 days    Georgette Shell, MD [facility] If 7PM-7AM, please contact night-coverage [email or web address] Password [email or web address] 04/12/2019, 10:12 AM .

## 2019-04-12 NOTE — Progress Notes (Signed)
Physical Therapy Treatment Patient Details Name: Jonathan [name] MRN: [id number] DOB: 06/21/1971 Today's Date: 04/12/2019    History of Present Illness 48 y.o. male admitted on 02/23/19 with AMS, O2 sats in the ED were 50%.  Pt dx with COVID 19 and resultant ARDS.  He was intubated 4/21-5/11. Reintubated and trached 5/12. Off vent and on Tbar since 5/21.  His course is complicated by acute kidney injury requiring CVVHD.  Pt transferred back to [name] on 5/26 and changed to intermittent HD. Pt tested Covid negative 5/29 and 5/30.     PT Comments    Continuing work on functional mobility and activity tolerance;  Much improved activity tolerance today, able to walk farther and did not need sitting rest breaks, took standing rest breaks; So great to hear his voice today!  Dizziness reported at a 5/10, but it did not worsen with standing activity/amb.  BP supine start of session: 123/82 Sitting EOB: 134/91 Initial sit<>stand: 120/79 While walking approx 2-3 min: 118/82 Standing rest after walking: 104/84 (continues to report no increase in symptoms) Again after approx 1 min walking/standing: 138/87  He seemed very encouraged to be able to go to CIR tomorrow.   Follow Up Recommendations  CIR     Equipment Recommendations  3in1 (PT);Other (comment)(rollator)    Recommendations for Other Services       Precautions / Restrictions Precautions Precautions: Fall Precaution Comments: trach now with PMSV, R IJ HD cath now removed Restrictions Weight Bearing Restrictions: No    Mobility  Bed Mobility Overal bed mobility: Needs Assistance Bed Mobility: Supine to Sit     Supine to sit: HOB elevated;Supervision     General bed mobility comments: Assist for safety and lines  Transfers Overall transfer level: Needs assistance Equipment used: Rolling walker (2 wheeled) Transfers: Sit to/from Stand Sit to Stand: Min assist;+2 safety/equipment         General transfer  comment: Assist to bring hips up and for balance. VCs for safe hand placement as pt tends to pull up on RW, +2 for safety today as pt intiailly scooting himself VERY close to EOB   Ambulation/Gait Ambulation/Gait assistance: Min assist;+2 safety/equipment Gait Distance (Feet): 80 Feet(with standing rest breaks) Assistive device: Rolling walker (2 wheeled) Gait Pattern/deviations: Step-through pattern;Decreased step length - right;Decreased step length - left;Narrow base of support     General Gait Details: Dizziness present throughout amb, but it did not change or worsen with incr distance; walked on 40% O2 via trach, and O2 sats ranged 89%-92%; HR elevated   Stairs             Wheelchair Mobility    Modified Rankin (Stroke Patients Only)       Balance Overall balance assessment: Needs assistance Sitting-balance support: No upper extremity supported;Feet supported Sitting balance-Leahy Scale: Fair     Standing balance support: Bilateral upper extremity supported Standing balance-Leahy Scale: (Approaching Fair) Standing balance comment: walker and min assist for static standing                            Cognition Arousal/Alertness: Awake/alert Behavior During Therapy: WFL for tasks assessed/performed Overall Cognitive Status: Difficult to assess                                 General Comments: Pt following commands and appropriate during session. Demonstrating use of call bell.  Reporting how he feels. Evonnie Dawes in place but speech very low volume      Exercises      General Comments General comments (skin integrity, edema, etc.): pt on trach collar with supplemental O2 (8L, 40%); lowest O2 sat overall 88% with standing activity; RR up to low 40s, BP monitored (see clinial impression statement); pt reports 5/10 dizziness but reports symptoms do not increase with activity today      Pertinent Vitals/Pain Pain Assessment: No/denies  pain Pain Score: 0-No pain    Home Living                      Prior Function            PT Goals (current goals can now be found in the care plan section) Acute Rehab PT Goals Patient Stated Goal: less dizziness PT Goal Formulation: Patient unable to participate in goal setting Time For Goal Achievement: 04/13/19 Potential to Achieve Goals: Good Progress towards PT goals: Progressing toward goals    Frequency    Min 3X/week      PT Plan Current plan remains appropriate    Co-evaluation PT/OT/SLP Co-Evaluation/Treatment: Yes Reason for Co-Treatment: Complexity of the patient's impairments (multi-system involvement);For patient/therapist safety;To address functional/ADL transfers PT goals addressed during session: Mobility/safety with mobility OT goals addressed during session: ADL's and self-care      AM-PAC PT "6 Clicks" Mobility   Outcome Measure  Help needed turning from your back to your side while in a flat bed without using bedrails?: A Little Help needed moving from lying on your back to sitting on the side of a flat bed without using bedrails?: A Little Help needed moving to and from a bed to a chair (including a wheelchair)?: A Little Help needed standing up from a chair using your arms (e.g., wheelchair or bedside chair)?: A Little Help needed to walk in hospital room?: A Lot Help needed climbing 3-5 steps with a railing? : Total 6 Click Score: 15    End of Session Equipment Utilized During Treatment: Oxygen;Gait belt Activity Tolerance: Patient tolerated treatment well Patient left: in chair;with call bell/phone within reach;with chair alarm set Nurse Communication: Mobility status PT Visit Diagnosis: Muscle weakness (generalized) (M62.81);Difficulty in walking, not elsewhere classified (R26.2)     Time: 1340-1410 PT Time Calculation (min) (ACUTE ONLY): 30 min  Charges:  $Gait Training: 8-22 mins                     Roney Marion, [place]   Acute Rehabilitation Services Pager [phone] Office [phone]    [place]ta [place] 04/12/2019, 4:22 PM

## 2019-04-12 NOTE — Progress Notes (Signed)
Occupational Therapy Treatment Patient Details Name: Jonathan [name] MRN: [id number] DOB: 1970-12-04 Today's Date: 04/12/2019    History of present illness 48 y.o. male admitted on 02/23/19 with AMS, O2 sats in the ED were 50%.  Pt dx with COVID 19 and resultant ARDS.  He was intubated 4/21-5/11. Reintubated and trached 5/12. Off vent and on Tbar since 5/21.  His course is complicated by acute kidney injury requiring CVVHD.  Pt transferred back to [facility] on 5/26 and changed to intermittent HD. Pt tested Covid negative 5/29 and 5/30.    OT comments  Pt continues to make steady progress with therapies. Pt with increased mobility distance during session today, taking standing rest breaks with activity to ensure vitals remaining within stable limits but pt able to maintain standing without requiring seated rest. Pt demonstrating LB ADL, overall requires min-modA for task completion. He continues to require minA(+2) for safe completion of mobility with RW. Pt on trach collar with use of supplemental O2 (8L, 40% trach collar), overall lowest O2 sat 88% with activity; HR up to high 120s/low 130s; RR into low 40s. BP monitored throughout (see below) as pt previously reporting dizziness with upright activity. Pt reports dizziness 5/10 today but reports symptoms do not increase with mobility/activity. Pt also with PMSV donned during session. Feel he remains excellent candidate for CIR therapies at time of discharge. Will continue to follow acutely. [employer] interpreter used Christian ID #: [id number]  BP supine start of session: 123/82 Sitting EOB: 134/91 Initial sit<>stand: 120/79 While walking approx 2-3 min: 118/82 Standing rest after walking: 104/84 (continues to report no increase in symptoms) Again after approx 1 min walking/standing: 138/87   Follow Up Recommendations  CIR    Equipment Recommendations  3 in 1 bedside commode       Precautions / Restrictions Precautions Precautions:  Fall Precaution Comments: trach now with PMSV, R IJ HD cath now removed Restrictions Weight Bearing Restrictions: No       Mobility Bed Mobility Overal bed mobility: Needs Assistance Bed Mobility: Supine to Sit     Supine to sit: HOB elevated;Supervision     General bed mobility comments: Assist for safety and lines  Transfers Overall transfer level: Needs assistance Equipment used: Rolling walker (2 wheeled) Transfers: Sit to/from Stand Sit to Stand: Min assist;+2 safety/equipment         General transfer comment: Assist to bring hips up and for balance. VCs for safe hand placement as pt tends to pull up on RW, +2 for safety today as pt intiailly scooting himself VERY close to EOB     Balance Overall balance assessment: Needs assistance Sitting-balance support: No upper extremity supported;Feet supported Sitting balance-Leahy Scale: Fair     Standing balance support: Bilateral upper extremity supported Standing balance-Leahy Scale: Poor Standing balance comment: walker and min assist for static standing                           ADL either performed or assessed with clinical judgement   ADL Overall ADL's : Needs assistance/impaired                     Lower Body Dressing: Minimal assistance;Moderate assistance;+2 for safety/equipment;Sit to/from stand Lower Body Dressing Details (indicate cue type and reason): pt bringing LEs up to adjust socks seated EOB, increased effort but no physical assist required             Functional  mobility during ADLs: Minimal assistance;+2 for safety/equipment;Rolling walker General ADL Comments: pt continues to progress with therapies, improved activity tolerance today requiring less rest breaks     Vision       Perception     Praxis      Cognition Arousal/Alertness: Awake/alert Behavior During Therapy: WFL for tasks assessed/performed Overall Cognitive Status: Difficult to assess                                  General Comments: Pt following commands and appropriate during session. Demonstrating use of call bell. Reporting how he feels. Evonnie Dawes in place but speech very low volume        Exercises     Shoulder Instructions       General Comments pt on trach collar with supplemental O2 (8L, 40%); lowest O2 sat overall 88% with standing activity; RR up to low 40s, BP monitored (see clinial impression statement); pt reports 5/10 dizziness but reports symptoms do not increase with activity today    Pertinent Vitals/ Pain       Pain Assessment: No/denies pain Pain Score: 0-No pain  Home Living                                          Prior Functioning/Environment              Frequency  Min 3X/week        Progress Toward Goals  OT Goals(current goals can now be found in the care plan section)  Progress towards OT goals: Progressing toward goals  Acute Rehab OT Goals Patient Stated Goal: less dizziness OT Goal Formulation: With patient Time For Goal Achievement: 04/19/19 Potential to Achieve Goals: Good  Plan Discharge plan remains appropriate    Co-evaluation    PT/OT/SLP Co-Evaluation/Treatment: Yes Reason for Co-Treatment: Complexity of the patient's impairments (multi-system involvement);For patient/therapist safety;To address functional/ADL transfers   OT goals addressed during session: ADL's and self-care      AM-PAC OT "6 Clicks" Daily Activity     Outcome Measure   Help from another person eating meals?: A Little Help from another person taking care of personal grooming?: A Little Help from another person toileting, which includes using toliet, bedpan, or urinal?: A Lot Help from another person bathing (including washing, rinsing, drying)?: A Lot Help from another person to put on and taking off regular upper body clothing?: A Lot Help from another person to put on and taking off regular lower body  clothing?: A Lot 6 Click Score: 14    End of Session Equipment Utilized During Treatment: Rolling walker;Oxygen  OT Visit Diagnosis: Other abnormalities of gait and mobility (R26.89);Muscle weakness (generalized) (M62.81);Pain Pain - Right/Left: Right Pain - part of body: Shoulder   Activity Tolerance Patient tolerated treatment well   Patient Left in chair;with call bell/phone within reach;with chair alarm set   Nurse Communication Mobility status        Time: 1340-1410 OT Time Calculation (min): 30 min  Charges: OT General Charges $OT Visit: 1 Visit OT Treatments $Therapeutic Activity: 8-22 mins   Lou Cal, OT Supplemental Rehabilitation Services Pager [phone] Office [phone]   Raymondo Band 04/12/2019, 3:10 PM

## 2019-04-13 LAB — COMPREHENSIVE METABOLIC PANEL
ALT: 30 U/L (ref 0–44)
AST: 26 U/L (ref 15–41)
Albumin: 2.6 g/dL — ABNORMAL LOW (ref 3.5–5.0)
Alkaline Phosphatase: 121 U/L (ref 38–126)
Anion gap: 11 (ref 5–15)
BUN: 38 mg/dL — ABNORMAL HIGH (ref 6–20)
CO2: 22 mmol/L (ref 22–32)
Calcium: 9.7 mg/dL (ref 8.9–10.3)
Chloride: 103 mmol/L (ref 98–111)
Creatinine, Ser: 1.54 mg/dL — ABNORMAL HIGH (ref 0.61–1.24)
GFR calc Af Amer: >60 mL/min (ref >60)
GFR calc non Af Amer: 53 mL/min — ABNORMAL LOW (ref >60)
Glucose, Bld: 111 mg/dL — ABNORMAL HIGH (ref 70–99)
Potassium: 3.4 mmol/L — ABNORMAL LOW (ref 3.5–5.1)
Sodium: 136 mmol/L (ref 135–145)
Total Bilirubin: 0.6 mg/dL (ref 0.3–1.2)
Total Protein: 6.5 g/dL (ref 6.5–8.1)

## 2019-04-13 LAB — CBC
HCT: 24 % — ABNORMAL LOW (ref 39.0–52.0)
Hemoglobin: 7.5 g/dL — ABNORMAL LOW (ref 13.0–17.0)
MCH: 29.3 pg (ref 26.0–34.0)
MCHC: 31.3 g/dL (ref 30.0–36.0)
MCV: 93.8 fL (ref 80.0–100.0)
Platelets: 334 10*3/uL (ref 150–400)
RBC: 2.56 MIL/uL — ABNORMAL LOW (ref 4.22–5.81)
RDW: 14.4 % (ref 11.5–15.5)
WBC: 10.1 10*3/uL (ref 4.0–10.5)
nRBC: 0 % (ref 0.0–0.2)

## 2019-04-13 LAB — GLUCOSE, CAPILLARY
Glucose-Capillary: 96 mg/dL (ref 70–99)
Glucose-Capillary: 129 mg/dL — ABNORMAL HIGH (ref 70–99)
Glucose-Capillary: 95 mg/dL (ref 70–99)
Glucose-Capillary: 161 mg/dL — ABNORMAL HIGH (ref 70–99)

## 2019-04-13 LAB — HEPATITIS B SURFACE ANTIGEN: Hepatitis B Surface Ag: NEGATIVE

## 2019-04-13 LAB — PHOSPHORUS: Phosphorus: 3.6 mg/dL (ref 2.5–4.6)

## 2019-04-13 MED ORDER — ENSURE ENLIVE PO LIQD
237.0000 mL | Freq: Two times a day (BID) | ORAL | Status: DC
  Administered 2019-04-14 – 2019-04-19 (×10): 237 mL via ORAL

## 2019-04-13 NOTE — Progress Notes (Signed)
Pt resp high (in 30s) and shallow, o2 sats 89-91%.  Not in acute distress but working slightly harder on breathing.  RT paged and MD notified. RT did routine cares & after pt o2 sats improved 97% and breathing easier.   Passed along to night shift.

## 2019-04-13 NOTE — Progress Notes (Signed)
Assisted tele visit to patient with wife.  Maryelizabeth Rowan, RN

## 2019-04-13 NOTE — Progress Notes (Signed)
Inpatient Rehabilitation Admissions Coordinator  Noted overnight increase in respiratory issues. I will not plan admit to CIR today. I will follow.  Danne Baxter, RN, MSN Rehab Admissions Coordinator [phone] 04/13/2019 11:13 AM

## 2019-04-13 NOTE — Progress Notes (Signed)
Pt due for trach change today, will ask MD to put order in.

## 2019-04-13 NOTE — Progress Notes (Signed)
Per CCM NP (P Hofmann), please hold trach change at this time due to pt possibly having capping trial/decanulation soon. CCM will see pt tomorrow 6/10

## 2019-04-13 NOTE — Progress Notes (Signed)
PROGRESS NOTE    Jonathan [name]  TLX:[id number] DOB: [birth year] DOA: 02/23/2019 PCP: Patient, No Pcp Per   Brief Narrative:48 year old Spanish-speaking male from [place], was admitted on 4/21 with increased respiratory distress, he was intubated on admission for ARDS due to SARS COVID-19 infection. -Unfortunately developed severe ATN required CRRT, prolonged respiratory failure , was extubated and reintubated earlier this month, subsequently underwent tracheostomy on 5/12 , followed by placement of a permacath on 5/13 -Ventrally liberated off the ventilator 5/21 however remained anuric requiring intermittent hemodialysis subsequently transferred to [facility] 5/26 from [facility]. -Was also started on anticoagulation while in ICU for suspected pulmonary embolism -5/29 passed swallow evaluation, his core track was removed and he was started on dysphagia diet with nectar thick liquids, last HD 5/30,hopeful for renal recovery -Repeat COVID tested negative x2 on 5/29 and 5/30 Events: 02/23/2019 admitted to the ICU. 02/24/2019 started on CRRT 4/21-4/24 Proned daily 4/28 -4/30 proned daily  03/04/2019 increase in oxygen demand required paralytic for 24 hours. 03/09/2019 hematochezia from gastric aspiration 5/11 extubated 5/12 re-intubated, tracheostomy 5/13 perm cath 5/14 worsening hypoxemia, shock suddenly, RV dilated, no new CXR changes, treated for PE empirically 5/21 doing t-piece trials 5/23: Trial off of CVVHD 5/24: 2 units PRBC 5/25 remains off of ventilator support since May 21, remains anuric, 5/26 patient transferred to [facility] from [facility] as he needed intermittent hemodialysis  5/29 passed modified swallow eval 5/30 started on nectar thick liquis and taking pills by mouth,dialysis  Lines and tubes: 4/21 - ETT >>5.11.2020 4/21 - RIJ CVC, removed 4/22 L IJ HD Cath> removed 5/13 Permacath right 6/6 [facility] DCD  Antibiotics: 02/23/2019-425 2020 azithromycin and  Rocephin 02/23/2019-02/24/2019 03/06/2019 Rocephin 5/11-5/17Vancomycin 5/12-5/18Zosyn 5/12-5/17Fluconazole for candida in tracheal aspirate and recent Actemra I took over his care 04/07/2019. Overnight patient had an episode of bleeding from the pressure ulcer on his face which needed epi and lidocaine by ENT. Lovenox was stopped. He is on Lovenox for presumed pulmonary embolism.  [place] taken out 04/10/2019.  Recap last 7 days -patient remains on 35% FiO2 pulmonary following at a distance.  Repeat swallow evaluation modified barium swallow was done 04/12/2019 and he was started on a regular diet with thin liquids.  This was done yesterday 04/12/2019.  After he took his first meal patient became more hypoxic and tachycardic it appears that he ate too fast and too much at one time.  His renal functions improved so much that no more dialysis is needed temporary dialysis catheter was taken out 04/10/2019.  Patient has bilateral cheek ulcers from pressure injury from pronating while intubated when he had COVID pneumonia.  The right cheek has been healed.  The left cheek had bleeding profusely when ENT had to be consulted and they injected epi and lidocaine.  ENT recommends biopsy if it does not heal in 1 week.  However his left cheek wound is healing slowly.  He is also followed by CIR when patient is medically stable he will be discharged to CIR.  He is a Psychologist, sport and exercise.  His whole family is in [place].  [place]:   Active Problems:   ARDS (adult respiratory distress syndrome) (HCC)   Pressure injury of skin   Acute respiratory failure with hypoxia ([place])   COVID-19   AKI (acute kidney injury) ([place])   ATN (acute tubular necrosis) ([place])   Tracheostomy care ([place])   On tube feeding diet   Status post tracheostomy ([place])   History of ETT   Encounter  for intubation   Acute respiratory failure with hypoxia and hypercapnia (HCC)  Acute Hypoxic Resp. Failure due to Acute Covid 19 Viral Illness/ARDS/septic  shock  -Treated at [facility] with IV steroids, IV Actemra on 4/22, -Also received convalescent plasma on 5/3 -Status post tracheostomy 5/12 -Weaned off the ventilator 5/21 -Still has moderate O2 requirement with FiO2 of 35% -Chest x-ray 5/30 showed low lung volumes and patchy airspace disease which was improving -CCM following intermittently for trach care, trach was downsized to #4 uncuffed [place] on 5/26. Patient remains on 8 L/35% FiO2of oxygen saturation 94%. Hopefully capping trial this week with early decannulation. -CIRconsulted and foll -Patient became hypoxic tachycardic tachypneic 6/8/ 2020 chest x-ray done shows persistent bilateral patchy pulmonary infiltrates similar or slightly worsened.  Follow-up chest x-ray tomorrow if worsening infiltrates continues to be hypoxic consider starting antibiotics for aspiration.  Presumed acute PE -On 5/14 patient had a sudden decline with shock, worsening hypoxemia and echocardiogram which showed right heart strain -CT angiogram could not be obtained due to severe AKI -He was started on IV heparin for presumed PE, Dopplers were negative -3 months of anticoagulation recommended -Lovenox has been restarted 04/08/2019 since he did not have any further active bleeding.  Acute kidney injury due to ATN/Covid -Baseline creatinine was normal 2 months ago, he started CRRT on 4/21 and transition to intermittent hemodialysis 5/28, was undergoing HD Tuesday Thursday Saturday via TDC,his renal functions have significantly improved and dialysis has been stopped at this time. Nephrology signed off 04/11/2019  Anemia due to critical illness-he had small amount of bleeding from the tracheostomy site received 3 units of blood transfusion overall during this hospital stay. He also had bleeding from the pressure ulcer on the left side of his face which has been resolved.  Dysphagia -Insetting of subacute respiratory failure, tracheostomy -SLPrecommends  dysphagia 1 puree nectar thick liquid.  Hyperglycemia -No clear history of diabetes, hemoglobin A1c was 5.5 -Remains on Lantus 10 units twice daily and sliding scale insulin, CBGs are less than 170  Pressure injury on the skin of nose.bilateral cheeks not present on admission Local wound care.  With soap and water daily. -Appears to be healing  DVT Prophylaxis:Lovenox  PUD Prophylaxis:On famotidine/ppi Code Status:Partial code Family Communication discussed with his wife in [place] call interpreter [phone] Spero Geralds if you call her she will contact you to her family and interpret for you. disposition Planeventually discharged to CIR depending  trach capping trials,PMV and diet advancement. Speech therapy following  Pressure Injury 03/03/19 Face Right Unstageable - Full thickness tissue loss in which the base of the ulcer is covered by slough (yellow, tan, gray, green or brown) and/or eschar (tan, brown or black) in the wound bed. 2cm x2 1/4 cm  red edges  with blac (Active)  03/03/19 0830  Location: Face  Location Orientation: Right  Staging: Unstageable - Full thickness tissue loss in which the base of the ulcer is covered by slough (yellow, tan, gray, green or brown) and/or eschar (tan, brown or black) in the wound bed.  Wound Description (Comments): 2cm x2 1/4 cm  red edges  with black cneter  Present on Admission: No     Pressure Injury 03/03/19 Face Left Unstageable - Full thickness tissue loss in which the base of the ulcer is covered by slough (yellow, tan, gray, green or brown) and/or eschar (tan, brown or black) in the wound bed. 1 1/2 cm x 2 1/2 cm red edges with y (Active)  03/03/19 0830  Location: Face  Location Orientation: Left  Staging: Unstageable - Full thickness tissue loss in which the base of the ulcer is covered by slough (yellow, tan, gray, green or brown) and/or eschar (tan, brown or black) in the wound bed.  Wound Description (Comments): 1 1/2 cm x 2  1/2 cm red edges with yellow center and black ring around yellow area  Present on Admission: No      Nutrition Problem: Inadequate oral intake Etiology: inability to eat     Signs/Symptoms: NPO status    Interventions: Nepro shake  Estimated body mass index is 23.55 kg/m as calculated from the following:   Height as of this encounter: 5\' 5"  (1.651 m).   Weight as of this encounter: 64.2 kg.   Subjective: Asking for food no other complaint   Objective: Vitals:   04/13/19 0820 04/13/19 1000 04/13/19 1127 04/13/19 1200  BP: 121/74 113/74 118/75 118/80  Pulse: (!) 109 100 (!) 102 96  Resp: (!) 33 (!) 30 (!) 27 (!) 38  Temp:      TempSrc:      SpO2: 96% 95% 96% 96%  Weight:      Height:        Intake/Output Summary (Last 24 hours) at 04/13/2019 1301 Last data filed at 04/13/2019 1205 Gross per 24 hour  Intake 10 ml  Output 1600 ml  Net -1590 ml   Filed Weights   04/09/19 0500 04/10/19 0326 04/12/19 0349  Weight: 60.5 kg 64.5 kg 64.2 kg    Examination:  General exam: Appears calm and comfortable  Respiratory system: Coarse to auscultation. Respiratory effort normal. Cardiovascular system: S1 & S2 heard, RRR. No JVD, murmurs, rubs, gallops or clicks. No pedal edema. Gastrointestinal system: Abdomen is nondistended, soft and nontender. No organomegaly or masses felt. Normal bowel sounds heard. Central nervous system: Alert and oriented. No focal neurological deficits. Extremities: Symmetric 5 x 5 power. Skin: No rashes, lesions or ulcers Psychiatry: Judgement and insight appear normal. Mood & affect appropriate.     Data Reviewed: I have personally reviewed following labs and imaging studies  CBC: Recent Labs  Lab 04/09/19 0549 04/10/19 0450 04/11/19 0649 04/12/19 0902 04/13/19 0449  WBC 12.0* 10.4 10.8* 8.7 10.1  HGB 6.7* 7.7* 7.8* 8.1* 7.5*  HCT 21.6* 23.8* 24.5* 25.6* 24.0*  MCV 96.0 94.1 92.8 94.8 93.8  PLT 307 294 325 338 500   Basic  Metabolic Panel: Recent Labs  Lab 04/09/19 0549 04/10/19 0450 04/11/19 0649 04/12/19 0902 04/13/19 0449  NA 139 136 137 134* 136  K 4.0 3.5 3.5 3.0* 3.4*  CL 106 102 103 102 103  CO2 24 24 25 22 22   GLUCOSE 131* 139* 114* 227* 111*  BUN 97* 63* 52* 42* 38*  CREATININE 3.02* 2.35* 2.00* 1.71* 1.54*  CALCIUM 10.5* 10.4* 10.3 10.0 9.7  PHOS 3.3 3.3 3.1 3.4 3.6   GFR: Estimated Creatinine Clearance: 51 mL/min (A) (by C-G formula based on SCr of 1.54 mg/dL (H)). Liver Function Tests: Recent Labs  Lab 04/09/19 0549 04/10/19 0450 04/11/19 0649 04/12/19 0902 04/13/19 0449  AST  --   --  24 23 26   ALT  --   --  29 29 30   ALKPHOS  --   --  117 128* 121  BILITOT  --   --  0.5 0.4 0.6  PROT  --   --  6.6 6.8 6.5  ALBUMIN 2.8* 2.6* 2.7* 2.8* 2.6*   No results for input(s): LIPASE, AMYLASE  in the last 168 hours. No results for input(s): AMMONIA in the last 168 hours. Coagulation Profile: Recent Labs  Lab 04/07/19 2358  INR 1.1   Cardiac Enzymes: No results for input(s): CKTOTAL, CKMB, CKMBINDEX, TROPONINI in the last 168 hours. BNP (last 3 results) No results for input(s): PROBNP in the last 8760 hours. HbA1C: No results for input(s): HGBA1C in the last 72 hours. CBG: Recent Labs  Lab 04/12/19 1731 04/12/19 2116 04/12/19 2346 04/13/19 0733 04/13/19 1203  GLUCAP 259* 144* 105* 96 129*   Lipid Profile: No results for input(s): CHOL, HDL, LDLCALC, TRIG, CHOLHDL, LDLDIRECT in the last 72 hours. Thyroid Function Tests: No results for input(s): TSH, T4TOTAL, FREET4, T3FREE, THYROIDAB in the last 72 hours. Anemia Panel: No results for input(s): VITAMINB12, FOLATE, FERRITIN, TIBC, IRON, RETICCTPCT in the last 72 hours. Sepsis Labs: No results for input(s): PROCALCITON, LATICACIDVEN in the last 168 hours.  No results found for this or any previous visit (from the past 240 hour(s)).       Radiology Studies: Dg Chest Port 1 View  Result Date: 04/12/2019 CLINICAL  DATA:  Worsening shortness of breath EXAM: PORTABLE CHEST 1 VIEW COMPARISON:  04/03/2019 FINDINGS: Tracheostomy remains in place. Central line is been removed. Heart size is normal allowing for technical factors. Widespread patchy bilateral pulmonary infiltrates persist. These are similar or slightly progressed. No visible effusion. IMPRESSION: Persistent bilateral patchy pulmonary infiltrates, similar or slightly worsened. Electronically Signed   By: Nelson Chimes M.D.   On: 04/12/2019 19:20   Dg Swallowing Func-speech Pathology  Result Date: 04/12/2019 Objective Swallowing Evaluation: Type of Study: MBS-Modified Barium Swallow Study  Patient Details Name: Kip Cropp MRN: [id number] Date of Birth: 1971/02/24 Today's Date: 04/12/2019 Time: SLP Start Time (ACUTE ONLY): 1200 -SLP Stop Time (ACUTE ONLY): 1220 SLP Time Calculation (min) (ACUTE ONLY): 20 min Past Medical History: No past medical history on file.  HPI: Pt is a 48 y.o. male admitted on 02/23/19 with AMS, O2 sats in the ED were 50%.  Pt dx with COVID 19 and resultant ARDS.  He was intubated 4/21-5/11; reintubated and then trached on 5/12. His course was complicated by acute kidney injury requiring CVVHD.  PMH includes DM2, HTN.. Pt transferred from [place] to [facility] on 5/26; now requiring intermittent HD.  Subjective: pleasant, cooperative Assessment / Plan / Recommendation CHL IP CLINICAL IMPRESSIONS 04/12/2019 Clinical Impression Paitent presents with a signfiicantly improved swallow as compared to MBS on 5/26. He did not exhibit any penetration or aspiration with any of the tested boluses (thin, nectar, puree, regular, tablet)and UES opening and bolus transit was [facility]. The only deficit noted was trace-mild vallecular residuals post swallows of puree and regular solids, but with full clearance from pharynx post subsequent swallows. PMV was in place during entire test. SLP Visit Diagnosis Dysphagia, pharyngeal phase (R13.13) Attention and concentration  deficit following -- Frontal lobe and executive function deficit following -- Impact on safety and function No limitations;Mild aspiration risk   CHL IP TREATMENT RECOMMENDATION 04/12/2019 Treatment Recommendations Therapy as outlined in treatment plan below   Prognosis 04/12/2019 Prognosis for Safe Diet Advancement Good Barriers to Reach Goals -- Barriers/Prognosis Comment -- CHL IP DIET RECOMMENDATION 04/12/2019 SLP Diet Recommendations Regular solids;Thin liquid Liquid Administration via Straw;Cup Medication Administration Whole meds with liquid Compensations Minimize environmental distractions;Slow rate;Small sips/bites Postural Changes Seated upright at 90 degrees   CHL IP OTHER RECOMMENDATIONS 04/12/2019 Recommended Consults -- Oral Care Recommendations Oral care BID Other Recommendations Place PMSV during  PO intake   CHL IP FOLLOW UP RECOMMENDATIONS 04/12/2019 Follow up Recommendations Inpatient Rehab   CHL IP FREQUENCY AND DURATION 04/12/2019 Speech Therapy Frequency (ACUTE ONLY) min 2x/week Treatment Duration 2 weeks      CHL IP ORAL PHASE 04/12/2019 Oral Phase WFL Oral - Pudding Teaspoon -- Oral - Pudding Cup -- Oral - Honey Teaspoon -- Oral - Honey Cup -- Oral - Nectar Teaspoon -- Oral - Nectar Cup -- Oral - Nectar Straw -- Oral - Thin Teaspoon -- Oral - Thin Cup -- Oral - Thin Straw -- Oral - Puree -- Oral - Mech Soft -- Oral - Regular -- Oral - Multi-Consistency -- Oral - Pill -- Oral Phase - Comment --  CHL IP PHARYNGEAL PHASE 04/12/2019 Pharyngeal Phase Impaired Pharyngeal- Pudding Teaspoon -- Pharyngeal -- Pharyngeal- Pudding Cup -- Pharyngeal -- Pharyngeal- Honey Teaspoon -- Pharyngeal -- Pharyngeal- Honey Cup -- Pharyngeal -- Pharyngeal- Nectar Teaspoon -- Pharyngeal -- Pharyngeal- Nectar Cup -- Pharyngeal -- Pharyngeal- Nectar Straw NT Pharyngeal -- Pharyngeal- Thin Teaspoon NT Pharyngeal -- Pharyngeal- Thin Cup WFL Pharyngeal -- Pharyngeal- Thin Straw NT Pharyngeal -- Pharyngeal- Puree WFL Pharyngeal --  Pharyngeal- Mechanical Soft Pharyngeal residue - valleculae Pharyngeal -- Pharyngeal- Regular Pharyngeal residue - valleculae Pharyngeal -- Pharyngeal- Multi-consistency -- Pharyngeal -- Pharyngeal- Pill WFL Pharyngeal -- Pharyngeal Comment --  CHL IP CERVICAL ESOPHAGEAL PHASE 04/12/2019 Cervical Esophageal Phase WFL Pudding Teaspoon -- Pudding Cup -- Honey Teaspoon -- Honey Cup -- Nectar Teaspoon -- Nectar Cup -- Nectar Straw -- Thin Teaspoon -- Thin Cup -- Thin Straw -- Puree -- Mechanical Soft -- Regular -- Multi-consistency -- Pill -- Cervical Esophageal Comment -- [name] 04/12/2019, 1:59 PM    Sonia Baller, MA, CCC-SLP Speech Therapy [facility] Acute Rehab Pager: [phone]               Scheduled Meds:  chlorhexidine  15 mL Mouth Rinse BID   Chlorhexidine Gluconate Cloth  6 each Topical Q0600   clonazepam  0.25 mg Oral Daily   darbepoetin (ARANESP) injection - NON-DIALYSIS  100 mcg Subcutaneous Q Mon-1800   enoxaparin (LOVENOX) injection  60 mg Subcutaneous Q12H   feeding supplement (NEPRO CARB STEADY)  237 mL Oral BID BM   feeding supplement (PRO-STAT SUGAR FREE 64)  30 mL Oral TID   guaiFENesin  5 mL Oral BID   insulin aspart  0-9 Units Subcutaneous TID WC   insulin aspart  6 Units Subcutaneous TID WC   insulin glargine  12 Units Subcutaneous BID   mouth rinse  15 mL Mouth Rinse q12n4p   mupirocin cream   Topical BID   pantoprazole  40 mg Oral BID   senna-docusate  1 tablet Oral BID   sodium chloride flush  10-40 mL Intracatheter Q12H   Continuous Infusions:  sodium chloride Stopped (03/21/19 2128)     LOS: 49 days     Georgette Shell, MD [facility]  If 7PM-7AM, please contact night-coverage [email or web address] Password [email or web address] 04/13/2019, 1:01 PM

## 2019-04-13 NOTE — Progress Notes (Signed)
Nutrition Follow-up  INTERVENTION:   -Provide Ensure Enlive po BID, each supplement provides 350 kcal and 20 grams of protein -D/c Nepro -Continue Prostat liquid protein PO 30 ml TID with meals, each supplement provides 100 kcal, 15 grams protein.  NUTRITION DIAGNOSIS:   Inadequate oral intake related to inability to eat as evidenced by NPO status.  Now on regular diet.  GOAL:   Patient will meet greater than or equal to 90% of their needs  Progressing.  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 Admitted with fever, cough, AMS, and respiratory distress. COVID-19 positive. S/P convalescent plasma transfusion on 5/3. Transferred to [place] on 5/6.  4/22: began CRRT 5/21: extubated 5/26: transferred from [place] to [place] at [facility] for intermittent HD 5/29: passed swallow eval, Cortrak was removed, TF stopped 6/1: COVID-19 test negative   **RD working remotely**  Patient is eating and appetite is much better. On 6/8, MBS was performed. Pt's swallowing was found to be significantly improved. Pt now approved for regular diet. Per MD note, pt had increased hypoxia following his first regular meal.  Now that pt is no longer requiring HD, will switch supplements to Ensure.  Weights beginning to increase since 6/5. Per I/O's: -4.1L since 5/26.  Medications reviewed. Labs reviewed: CBGs: 96-129 Low K  Diet Order:   Diet Order            Diet regular Room service appropriate? Yes with Assist; Fluid consistency: Thin  Diet effective now              EDUCATION NEEDS:   No education needs have been identified at this time  Skin:  Skin Assessment: Skin Integrity Issues: Skin Integrity Issues:: DTI, Stage II DTI: L and R side of face Stage II: nose  Last BM:  6/9  Height:   Ht Readings from Last 1 Encounters:  03/18/19 5\' 5"   (1.651 m)    Weight:   Wt Readings from Last 1 Encounters:  04/12/19 64.2 kg    Ideal Body Weight:  61.8 kg  BMI:  Body mass index is 23.55 kg/m.  Estimated Nutritional Needs:   Kcal:  1800-2100  Protein:  120 gm  Fluid:  1 L +UOP  Clayton Bibles, MS, RD, LDN [place] Dietitian Pager: [phone] After Hours Pager: [phone]

## 2019-04-13 NOTE — Progress Notes (Signed)
Pt o2 sats drop while eating to 87-low 90s.  Pt educated (with translator) about the need to eat slowly to keep oxygen up.  MD notified.

## 2019-04-14 ENCOUNTER — Inpatient Hospital Stay (HOSPITAL_COMMUNITY): Payer: HRSA Program

## 2019-04-14 LAB — GLUCOSE, CAPILLARY
Glucose-Capillary: 111 mg/dL — ABNORMAL HIGH (ref 70–99)
Glucose-Capillary: 130 mg/dL — ABNORMAL HIGH (ref 70–99)
Glucose-Capillary: 143 mg/dL — ABNORMAL HIGH (ref 70–99)
Glucose-Capillary: 159 mg/dL — ABNORMAL HIGH (ref 70–99)

## 2019-04-14 LAB — COMPREHENSIVE METABOLIC PANEL
ALT: 29 U/L (ref 0–44)
AST: 21 U/L (ref 15–41)
Albumin: 2.7 g/dL — ABNORMAL LOW (ref 3.5–5.0)
Alkaline Phosphatase: 120 U/L (ref 38–126)
Anion gap: 10 (ref 5–15)
BUN: 33 mg/dL — ABNORMAL HIGH (ref 6–20)
CO2: 23 mmol/L (ref 22–32)
Calcium: 9.9 mg/dL (ref 8.9–10.3)
Chloride: 105 mmol/L (ref 98–111)
Creatinine, Ser: 1.25 mg/dL — ABNORMAL HIGH (ref 0.61–1.24)
GFR calc Af Amer: >60 mL/min (ref >60)
GFR calc non Af Amer: >60 mL/min (ref >60)
Glucose, Bld: 105 mg/dL — ABNORMAL HIGH (ref 70–99)
Potassium: 3.4 mmol/L — ABNORMAL LOW (ref 3.5–5.1)
Sodium: 138 mmol/L (ref 135–145)
Total Bilirubin: 0.3 mg/dL (ref 0.3–1.2)
Total Protein: 6.7 g/dL (ref 6.5–8.1)

## 2019-04-14 LAB — CBC
HCT: 24.8 % — ABNORMAL LOW (ref 39.0–52.0)
Hemoglobin: 7.7 g/dL — ABNORMAL LOW (ref 13.0–17.0)
MCH: 29.4 pg (ref 26.0–34.0)
MCHC: 31 g/dL (ref 30.0–36.0)
MCV: 94.7 fL (ref 80.0–100.0)
Platelets: 357 10*3/uL (ref 150–400)
RBC: 2.62 MIL/uL — ABNORMAL LOW (ref 4.22–5.81)
RDW: 14.3 % (ref 11.5–15.5)
WBC: 10 10*3/uL (ref 4.0–10.5)
nRBC: 0 % (ref 0.0–0.2)

## 2019-04-14 LAB — PHOSPHORUS: Phosphorus: 4.1 mg/dL (ref 2.5–4.6)

## 2019-04-14 MED ORDER — LEVALBUTEROL TARTRATE 45 MCG/ACT IN AERO: 2.0000 | INHALATION_SPRAY | Freq: Four times a day (QID) | RESPIRATORY_TRACT | Status: DC

## 2019-04-14 MED ORDER — LEVALBUTEROL HCL 0.63 MG/3ML IN NEBU
0.6300 mg | INHALATION_SOLUTION | Freq: Four times a day (QID) | RESPIRATORY_TRACT | Status: DC
  Administered 2019-04-14 – 2019-04-16 (×6): 0.63 mg via RESPIRATORY_TRACT
  Filled 2019-04-14 (×6): qty 3

## 2019-04-14 MED ORDER — POTASSIUM CHLORIDE 10 MEQ/100ML IV SOLN
10.0000 meq | INTRAVENOUS | Status: AC
  Administered 2019-04-14 (×3): 10 meq via INTRAVENOUS
  Filled 2019-04-14 (×3): qty 100

## 2019-04-14 MED ORDER — FERROUS SULFATE 325 (65 FE) MG PO TABS
325.0000 mg | ORAL_TABLET | Freq: Every day | ORAL | Status: DC
  Administered 2019-04-14 – 2019-04-19 (×6): 325 mg via ORAL
  Filled 2019-04-14 (×7): qty 1

## 2019-04-14 NOTE — Progress Notes (Signed)
Translation service used for patient care this shift; med education given & pt verbally expressed understanding. Tolerating PMV well. Self oral suctions, able to cough up secretions.

## 2019-04-14 NOTE — Progress Notes (Signed)
PROGRESS NOTE  Jonathan [name] SPQ:[id number] DOB: 03/05/1971 DOA: 02/23/2019 PCP: Patient, No Pcp Per  HPI/Recap of past 24 hours: Brief Narrative:48 year old Spanish-speaking male from [place], was admitted on 4/21 with increased respiratory distress, he was intubated on admission for ARDS due to SARS COVID-19 infection. -Unfortunately developed severe ATN required CRRT, prolonged respiratory failure , was extubated and reintubated earlier this month, subsequently underwent tracheostomy on 5/12 , followed by placement of a permacath on 5/13 -Ventrally liberated off the ventilator 5/21 however remained anuric requiring intermittent hemodialysis subsequently transferred to [facility] 5/26 from [facility]. -Was also started on anticoagulation while in ICU for suspected pulmonary embolism -5/29 passed swallow evaluation, his core track was removed and he was started on dysphagia diet with nectar thick liquids, last HD 5/30,hopeful for renal recovery -Repeat COVID tested negative x2 on 5/29 and 5/30 Events: 02/23/2019 admitted to the ICU. 02/24/2019 started on CRRT 4/21-4/24 Proned daily 4/28 -4/30 proned daily  03/04/2019 increase in oxygen demand required paralytic for 24 hours. 03/09/2019 hematochezia from gastric aspiration 5/11 extubated 5/12 re-intubated, tracheostomy 5/13 perm cath 5/14 worsening hypoxemia, shock suddenly, RV dilated, no new CXR changes, treated for PE empirically 5/21 doing t-piece trials 5/23: Trial off of CVVHD 5/24: 2 units PRBC 5/25 remains off of ventilator support since May 21, remains anuric, 5/26 patient transferred to [facility] from [facility] as he needed intermittent hemodialysis  5/29 passed modified swallow eval 5/30 started on nectar thick liquis and taking pills by mouth,dialysis  Lines and tubes: 4/21 - ETT >>5.11.2020 4/21 - RIJ CVC, removed 4/22 L IJ HD Cath> removed 5/13 Permacath right 6/6 [facility] DCD  Antibiotics: 02/23/2019-425 2020  azithromycin and Rocephin 02/23/2019-02/24/2019 03/06/2019 Rocephin 5/11-5/17Vancomycin 5/12-5/18Zosyn 5/12-5/17Fluconazole for candida in tracheal aspirate and recent Actemra I took over his care 04/07/2019. Overnight patient had an episode of bleeding from the pressure ulcer on his face which needed epi and lidocaine by ENT. Lovenox was stopped. He is on Lovenox for presumed pulmonary embolism.  Devens taken out 04/10/2019.  Recap last 7 days-patient remains on 35% FiO2 pulmonary following at a distance. Repeat swallow evaluation modified barium swallow was done 04/12/2019 and he was started on a regular diet with thin liquids.  This was done yesterday 04/12/2019.  After he took his first meal patient became more hypoxic and tachycardic it appears that he ate too fast and too much at one time. His renal functions improved so much that no more dialysis is needed temporary dialysis catheter was taken out 04/10/2019. Patient has bilateral cheek ulcers from pressure injury from pronating while intubated when he had COVID pneumonia. The right cheek has been healed. The left cheek had bleeding profusely when ENT had to be consulted and they injected epi and lidocaine. ENT recommends biopsy if it does not heal in 1 week. However his left cheek wound is healing slowly. He is also followed by CIR when patient is medically stable he will be discharged to CIR. He is a Psychologist, sport and exercise.His whole family is in [place].  04/14/19: Patient seen and examined at his bedside.  Trach collar in place.  Denies chest pain.  Increased tachycardia this afternoon.  Will obtain twelve-lead EKG.    Assessment/Plan: Active Problems:   ARDS (adult respiratory distress syndrome) (HCC)   Pressure injury of skin   Acute respiratory failure with hypoxia (HCC)   COVID-19   AKI (acute kidney injury) (HCC)   ATN (acute tubular necrosis) (HCC)   Tracheostomy care ([place])   On tube  feeding diet   Status post tracheostomy ([place])   History  of ETT   Encounter for intubation   Acute respiratory failure with hypoxia and hypercapnia (HCC)  Acute Hypoxic Resp. Failure due to Acute Covid 19 Viral Illness/ARDS/septic shock requiring prone ventilation and nitric oxide for ARDS related to COVID-19 -Treated at [facility] with IV steroids, IV Actemra on 4/22, -Also received convalescent plasma on 5/3 -Status post tracheostomy 5/12 -Weaned off the ventilator 5/21 -Still has moderate O2 requirement with FiO2 of35% -Chest x-ray 5/30 showed low lung volumes and patchy airspace disease which was improving -CCM following intermittently for trach care, trach was downsized to #4 uncuffed Shiley on 5/26. Patient remains on 8 L/35% FiO2of oxygen saturation 94%. Hopefully capping trial this week with early decannulation. -CIRconsulted and foll -Patient became hypoxic tachycardic tachypneic 6/8/ 2020 chest x-ray done shows persistent bilateral patchy pulmonary infiltrates similar or slightly worsened.  Follow-up chest x-ray tomorrow if worsening infiltrates continues to be hypoxic consider starting antibiotics for aspiration. -Independently reviewed chest x-ray done on 04/14/2019 which shows slight improvement in the aeration bilaterally compared to prior chest x-ray done on 04/12/2019 which was also reviewed independently.  Tachycardia Heart rate has been ranging between 105 and 133 Obtain twelve-lead EKG  Presumed acute PE -On 5/14 patient had a sudden decline with shock, worsening hypoxemia and echocardiogram which showed right heart strain -CT angiogram could not be obtained due to severe AKI -He was started on IV heparin for presumed PE, Dopplers were negative -3 months of anticoagulation recommended -On full dose Lovenox subcu twice daily -Lovenox has been restarted 04/08/2019 since he did not have any further active bleeding.  Iron deficiency anemia/anemia of chronic disease Iron studies shows iron deficiency Started on ferrous sulfate  Monitor hemoglobin 7.7 from 7.5 yesterday  Acute kidney injury due to ATN/Covid -Baseline creatinine was normal 2 months ago, he started CRRT on 4/21 and transition to intermittent hemodialysis 5/28, was undergoing HD Tuesday Thursday Saturday via TDC,his renal functions have significantly improved and dialysis has been stopped at this time. Nephrology signed off 04/11/2019 Renal function continues to improve Creatinine 1.25 on 04/14/2019 from 1.54 on 04/13/2019  Anemia due to critical illness-he had small amount of bleeding from the tracheostomy site received 3 units of blood transfusion overall during this hospital stay. He also had bleeding from the pressure ulcer on the left side of his face which has been resolved.  Dysphagia -Insetting of subacute respiratory failure, tracheostomy -SLPrecommends dysphagia 1 puree nectar thick liquid initially -Repeat swallow evaluation done on 04/12/2019 recommendations for regular solids, thin liquid.  Hyperglycemia -No clear history of diabetes, hemoglobin A1c was 5.5 -Remains on Lantus 10 units twice daily and sliding scale insulin, CBGs are less than 170  Pressure injury on the skin of nose.bilateral cheeks not present on admission Local wound care.With soap and water daily. -Appears to be healing  DVT Prophylaxis:Full dose Lovenox  PUD Prophylaxis:On famotidine/ppi Code Status:Partial code Family Communicationdiscussed with his wife in [place] call interpreter [phone] Spero Geralds if you call her she will contact you to her family and interpret for you. disposition PlanDischarge to CIR when hemodynamically stable.  Pressure Injury 03/03/19 Face Right Unstageable - Full thickness tissue loss in which the base of the ulcer is covered by slough (yellow, tan, gray, green or brown) and/or eschar (tan, brown or black) in the wound bed. 2cm x2 1/4 cm  red edges  with blac (Active)  03/03/19 0830  Location: Face  Location Orientation:  Right  Staging: Unstageable - Full thickness tissue loss in which the base of the ulcer is covered by slough (yellow, tan, gray, green or brown) and/or eschar (tan, brown or black) in the wound bed.  Wound Description (Comments): 2cm x2 1/4 cm  red edges  with black cneter  Present on Admission: No     Pressure Injury 03/03/19 Face Left Unstageable - Full thickness tissue loss in which the base of the ulcer is covered by slough (yellow, tan, gray, green or brown) and/or eschar (tan, brown or black) in the wound bed. 1 1/2 cm x 2 1/2 cm red edges with y (Active)  03/03/19 0830  Location: Face  Location Orientation: Left  Staging: Unstageable - Full thickness tissue loss in which the base of the ulcer is covered by slough (yellow, tan, gray, green or brown) and/or eschar (tan, brown or black) in the wound bed.  Wound Description (Comments): 1 1/2 cm x 2 1/2 cm red edges with yellow center and black ring around yellow area  Present on Admission: No      Nutrition Problem: Inadequate oral intake Etiology: inability to eat     Signs/Symptoms: NPO status    Interventions: Nepro shake    Objective: Vitals:   04/14/19 0809 04/14/19 0816 04/14/19 1140 04/14/19 1242  BP:  122/89 115/67 125/75  Pulse: 86 89 (!) 125 (!) 130  Resp: (!) 26 (!) 25 (!) 29   Temp:  98.1 F (36.7 C)  98.1 F (36.7 C)  TempSrc:  Oral  Oral  SpO2: 99% 97% 92% 97%  Weight:      Height:        Intake/Output Summary (Last 24 hours) at 04/14/2019 1536 Last data filed at 04/14/2019 1000 Gross per 24 hour  Intake 490 ml  Output 2375 ml  Net -1885 ml   Filed Weights   04/10/19 0326 04/12/19 0349 04/14/19 0500  Weight: 64.5 kg 64.2 kg 71.2 kg    Exam:  . General: 48 y.o. year-old male well developed well nourished in no acute distress.  Alert and interactive.  Trach collar in place. . Cardiovascular: Regular rate and rhythm with no rubs or gallops.  No thyromegaly or JVD noted.   Marland Kitchen  Respiratory: Mild rales bilaterally.  No wheezes noted. . Abdomen: Soft nontender nondistended with normal bowel sounds x4 quadrants. . Musculoskeletal: Trace lower extremity edema. 2/4 pulses in all 4 extremities. Marland Kitchen Psychiatry: Mood is appropriate for condition and setting   Data Reviewed: CBC: Recent Labs  Lab 04/10/19 0450 04/11/19 0649 04/12/19 0902 04/13/19 0449 04/14/19 0546  WBC 10.4 10.8* 8.7 10.1 10.0  HGB 7.7* 7.8* 8.1* 7.5* 7.7*  HCT 23.8* 24.5* 25.6* 24.0* 24.8*  MCV 94.1 92.8 94.8 93.8 94.7  PLT 294 325 338 334 354   Basic Metabolic Panel: Recent Labs  Lab 04/10/19 0450 04/11/19 0649 04/12/19 0902 04/13/19 0449 04/14/19 0546  NA 136 137 134* 136 138  K 3.5 3.5 3.0* 3.4* 3.4*  CL 102 103 102 103 105  CO2 24 25 22 22 23   GLUCOSE 139* 114* 227* 111* 105*  BUN 63* 52* 42* 38* 33*  CREATININE 2.35* 2.00* 1.71* 1.54* 1.25*  CALCIUM 10.4* 10.3 10.0 9.7 9.9  PHOS 3.3 3.1 3.4 3.6 4.1   GFR: Estimated Creatinine Clearance: 62.9 mL/min (A) (by C-G formula based on SCr of 1.25 mg/dL (H)). Liver Function Tests: Recent Labs  Lab 04/10/19 0450 04/11/19 6568 04/12/19 0902 04/13/19 0449 04/14/19 0546  AST  --  24 23 26 21   ALT  --  29 29 30 29   ALKPHOS  --  117 128* 121 120  BILITOT  --  0.5 0.4 0.6 0.3  PROT  --  6.6 6.8 6.5 6.7  ALBUMIN 2.6* 2.7* 2.8* 2.6* 2.7*   No results for input(s): LIPASE, AMYLASE in the last 168 hours. No results for input(s): AMMONIA in the last 168 hours. Coagulation Profile: Recent Labs  Lab 04/07/19 2358  INR 1.1   Cardiac Enzymes: No results for input(s): CKTOTAL, CKMB, CKMBINDEX, TROPONINI in the last 168 hours. BNP (last 3 results) No results for input(s): PROBNP in the last 8760 hours. HbA1C: No results for input(s): HGBA1C in the last 72 hours. CBG: Recent Labs  Lab 04/13/19 1203 04/13/19 1703 04/13/19 2020 04/14/19 0815 04/14/19 1240  GLUCAP 129* 95 161* 111* 130*   Lipid Profile: No results for input(s):  CHOL, HDL, LDLCALC, TRIG, CHOLHDL, LDLDIRECT in the last 72 hours. Thyroid Function Tests: No results for input(s): TSH, T4TOTAL, FREET4, T3FREE, THYROIDAB in the last 72 hours. Anemia Panel: No results for input(s): VITAMINB12, FOLATE, FERRITIN, TIBC, IRON, RETICCTPCT in the last 72 hours. Urine analysis:    Component Value Date/Time   COLORURINE AMBER (A) 02/24/2019 0054   APPEARANCEUR HAZY (A) 02/24/2019 0054   LABSPEC 1.024 02/24/2019 0054   PHURINE 5.0 02/24/2019 0054   GLUCOSEU 50 (A) 02/24/2019 0054   HGBUR MODERATE (A) 02/24/2019 0054   BILIRUBINUR NEGATIVE 02/24/2019 0054   KETONESUR 5 (A) 02/24/2019 0054   PROTEINUR 100 (A) 02/24/2019 0054   NITRITE NEGATIVE 02/24/2019 0054   LEUKOCYTESUR NEGATIVE 02/24/2019 0054   Sepsis Labs: @LABRCNTIP (procalcitonin:4,lacticidven:4)  )No results found for this or any previous visit (from the past 240 hour(s)).    Studies: Dg Chest Port 1 View  Result Date: 04/14/2019 CLINICAL DATA:  Shortness of breath and cough EXAM: PORTABLE CHEST 1 VIEW COMPARISON:  04/12/2019 FINDINGS: Cardiac shadow is within normal limits. Tracheostomy tube remains in place. The overall inspiratory effort is poor. Patchy infiltrates are again identified but mildly improved. No new focal abnormality is noted. IMPRESSION: Slight improved aeration when compared with the previous day. Electronically Signed   By: Inez Catalina M.D.   On: 04/14/2019 09:11    Scheduled Meds: . chlorhexidine  15 mL Mouth Rinse BID  . Chlorhexidine Gluconate Cloth  6 each Topical Q0600  . clonazepam  0.25 mg Oral Daily  . darbepoetin (ARANESP) injection - NON-DIALYSIS  100 mcg Subcutaneous Q Mon-1800  . enoxaparin (LOVENOX) injection  60 mg Subcutaneous Q12H  . feeding supplement (ENSURE ENLIVE)  237 mL Oral BID BM  . feeding supplement (PRO-STAT SUGAR FREE 64)  30 mL Oral TID  . ferrous sulfate  325 mg Oral Q breakfast  . guaiFENesin  5 mL Oral BID  . insulin aspart  0-9 Units  Subcutaneous TID WC  . insulin aspart  6 Units Subcutaneous TID WC  . insulin glargine  12 Units Subcutaneous BID  . mouth rinse  15 mL Mouth Rinse q12n4p  . mupirocin cream   Topical BID  . pantoprazole  40 mg Oral BID  . senna-docusate  1 tablet Oral BID  . sodium chloride flush  10-40 mL Intracatheter Q12H    Continuous Infusions: . sodium chloride Stopped (03/21/19 2128)     LOS: 50 days     Kayleen Memos, MD [facility] Pager [phone]  If 7PM-7AM, please contact night-coverage [email or web address] Password [facility] 04/14/2019, 3:36 PM

## 2019-04-14 NOTE — Progress Notes (Signed)
  Speech Language Pathology Treatment: Dysphagia  Patient Details Name: Jonathan [name] MRN: [id number] DOB: 1971-09-20 Today's Date: 04/14/2019 Time: 1430-1450 SLP Time Calculation (min) (ACUTE ONLY): 20 min  Assessment / Plan / Recommendation Clinical Impression  Patient seen to address dysphagia goals with thin liquids (he declined regular solids as he had just recently finished lunch). Patient did not exhibit any overt s/s of aspiration or penetration with intake of thin liquids via straw sips but did report that he occasionally has cough when eating/drinking. When speaking with SLP, patient's RR would rise to low 40's but then return to low 20's. SLP educated patient regarding taking rest breaks when eating and drinking, and to stop if he is getting short of breath or having difficulty breathing. He stated that his main problem as far as breathing is at night when in bed. SLP advised patient to sleep with HOB more elevated. Patient verbalized understanding of education and continues to be safe on current diet. [employer] video interpreter used Jonathan [name]).    HPI HPI: Pt is a 48 y.o. male admitted on 02/23/19 with AMS, O2 sats in the ED were 50%.  Pt dx with COVID 19 and resultant ARDS.  He was intubated 4/21-5/11; reintubated and then trached on 5/12. His course was complicated by acute kidney injury requiring CVVHD.  PMH includes DM2, HTN.. Pt transferred from [place] to [facility] on 5/26; now requiring intermittent HD.      SLP Plan  Continue with current plan of care       Recommendations  Diet recommendations: Regular;Thin liquid Liquids provided via: Cup;Straw Medication Administration: Whole meds with liquid Supervision: Patient able to self feed;Intermittent supervision to cue for compensatory strategies Compensations: Small sips/bites;Slow rate;Minimize environmental distractions;Other (Comment)(take breaks if RR increases) Postural Changes and/or Swallow Maneuvers: Seated upright 90  degrees      Patient may use Passy-Muir Speech Valve: During PO intake/meals;During all waking hours (remove during sleep) PMSV Supervision: Intermittent         Oral Care Recommendations: Oral care BID Follow up Recommendations: Inpatient Rehab SLP Visit Diagnosis: Dysphagia, pharyngeal phase (R13.13) Plan: Continue with current plan of care       GO                [name] 04/14/2019, 5:25 PM   Sonia Baller, MA, [facility] [place] [facility] Pager: [phone]

## 2019-04-14 NOTE — Progress Notes (Addendum)
Assisted tele visit to patient with wife.  Donnae Michels M Lima Chillemi, RN   

## 2019-04-14 NOTE — Progress Notes (Signed)
Physical Therapy Treatment Patient Details Name: Jonathan [name] MRN: [id number] DOB: 1971-07-29 Today's Date: 04/14/2019    History of Present Illness 48 y.o. male admitted on 02/23/19 with AMS, O2 sats in the ED were 50%.  Pt dx with COVID 19 and resultant ARDS.  He was intubated 4/21-5/11. Reintubated and trached 5/12. Off vent and on Tbar since 5/21.  His course is complicated by acute kidney injury requiring CVVHD.  Pt transferred back to [facility] on 5/26 and changed to intermittent HD. Pt tested Covid negative 5/29 and 5/30.     PT Comments    Continuing work on functional mobility and activity tolerance;  Session focused on safety with transfer training and hand placement; Initially seemed confused with sequence and hand placement, but much improved with repetition; I'm curious to see about carryover for next session;   More supplemental O2 needs today to keep O2 sats at safe, acceptable levels with ambulation; At one point, [place] voiced concern that he may not be urinating enough -- notified Bree, [place];   Judas remains hopeful to transfer to CIR, and I continue to [employer] agree.  Follow Up Recommendations  CIR     Equipment Recommendations  3in1 (PT);Other (comment)(rollator)    Recommendations for Other Services       Precautions / Restrictions Precautions Precautions: Fall Precaution Comments: trach now with PMSV, R IJ HD cath now removed Restrictions Weight Bearing Restrictions: No    Mobility  Bed Mobility Overal bed mobility: Needs Assistance Bed Mobility: Supine to Sit     Supine to sit: HOB elevated;Supervision     General bed mobility comments: Assist for safety and lines  Transfers Overall transfer level: Needs assistance Equipment used: Rolling walker (2 wheeled) Transfers: Sit to/from Stand Sit to Stand: Min assist;Min guard;+2 safety/equipment         General transfer comment: min assist to steady progressing to minguard; Performed  multiple reps to reinforce safe hand placement  Ambulation/Gait Ambulation/Gait assistance: Min assist;Min guard;+2 safety/equipment Gait Distance (Feet): 10 Feet Assistive device: Rolling walker (2 wheeled) Gait Pattern/deviations: Step-through pattern;Decreased step length - right;Decreased step length - left;Narrow base of support     General Gait Details: No reports of dizziness; cues to self-monitor for activity tolerance; Amb distance limited by tachycardia and O2 sats in high 80s/low 90s with 55% O2 via TC   Stairs             Wheelchair Mobility    Modified Rankin (Stroke Patients Only)       Balance     Sitting balance-Leahy Scale: Fair       Standing balance-Leahy Scale: Fair                              Cognition Arousal/Alertness: Awake/alert Behavior During Therapy: WFL for tasks assessed/performed Overall Cognitive Status: Difficult to assess                                 General Comments: Initial difficulty understanding cues for hand placment with sit to stand/satnd to sit, but with repetition, improved      Exercises      General Comments General comments (skin integrity, edema, etc.): Noting occasional incr RR to mid to high 30s; Tachycardia (146 observed highest); O2 sats on the low side with higher percentage of O2; on 55% O2 and sats stayed in  high 80s/low 90s      Pertinent Vitals/Pain Pain Assessment: No/denies pain    Home Living                      Prior Function            PT Goals (current goals can now be found in the care plan section) Acute Rehab PT Goals Patient Stated Goal: agreeable to walk and freshen up at sink PT Goal Formulation: Patient unable to participate in goal setting Time For Goal Achievement: 04/28/19 Potential to Achieve Goals: Good Progress towards PT goals: Progressing toward goals;Goals met and updated - see care plan(less amb distance, but better transfers)     Frequency    Min 3X/week      PT Plan Current plan remains appropriate    Co-evaluation PT/OT/SLP Co-Evaluation/Treatment: Yes Reason for Co-Treatment: Complexity of the patient's impairments (multi-system involvement);For patient/therapist safety;To address functional/ADL transfers PT goals addressed during session: Mobility/safety with mobility        AM-PAC PT "6 Clicks" Mobility   Outcome Measure  Help needed turning from your back to your side while in a flat bed without using bedrails?: A Little Help needed moving from lying on your back to sitting on the side of a flat bed without using bedrails?: A Little Help needed moving to and from a bed to a chair (including a wheelchair)?: A Little Help needed standing up from a chair using your arms (e.g., wheelchair or bedside chair)?: A Little Help needed to walk in hospital room?: A Lot Help needed climbing 3-5 steps with a railing? : Total 6 Click Score: 15    End of Session Equipment Utilized During Treatment: Oxygen Activity Tolerance: Patient tolerated treatment well Patient left: in chair;with call bell/phone within reach;with chair alarm set Nurse Communication: Mobility status PT Visit Diagnosis: Muscle weakness (generalized) (M62.81);Difficulty in walking, not elsewhere classified (R26.2)     Time: 0518-3358 PT Time Calculation (min) (ACUTE ONLY): 38 min  Charges:  $Gait Training: 8-22 mins                     Roney Marion, [place] Pager [phone] Office [phone]    Colletta [place] 04/14/2019, 3:47 PM

## 2019-04-14 NOTE — Progress Notes (Signed)
Occupational Therapy Treatment Patient Details Name: Jonathan [name] MRN: [id number] DOB: 1971/10/14 Today's Date: 04/14/2019    History of present illness 48 y.o. male admitted on 02/23/19 with AMS, O2 sats in the ED were 50%.  Pt dx with COVID 19 and resultant ARDS.  He was intubated 4/21-5/11. Reintubated and trached 5/12. Off vent and on Tbar since 5/21.  His course is complicated by acute kidney injury requiring CVVHD.  Pt transferred back to [facility] on 5/26 and changed to intermittent HD. Pt tested Covid negative 5/29 and 5/30.    OT comments  Pt presents supine in bed agreeable to therapy session. Pt continues to progress with therapies; completing room level mobility using RW with minA (+2 safety/lines). After seated rest break pt performing standing grooming ADL with close minguard-minA. Pt with elevated HR and intermittent elevated RR (max HR 140s, RR intermittently up to 40s), requiring need for seated rest (vs pt fatigue). Pt on trach collar using O2 55% (15L) with O2 sats high 80s-low 90s with activity. Pt continues to demonstrate excellent motivation to work and progress with therapies. Recommend continue per POC. [employer] interpreter used [place] ID [phone].  Follow Up Recommendations  CIR    Equipment Recommendations  3 in 1 bedside commode          Precautions / Restrictions Precautions Precautions: Fall Precaution Comments: trach now with PMSV, R IJ HD cath now removed Restrictions Weight Bearing Restrictions: No       Mobility Bed Mobility Overal bed mobility: Needs Assistance Bed Mobility: Supine to Sit     Supine to sit: HOB elevated;Supervision     General bed mobility comments: Assist for safety and lines  Transfers Overall transfer level: Needs assistance Equipment used: Rolling walker (2 wheeled) Transfers: Sit to/from Stand Sit to Stand: Min assist;Min guard;+2 safety/equipment         General transfer comment: min assist to steady  progressing to minguard; Performed multiple reps to reinforce safe hand placement    Balance     Sitting balance-Leahy Scale: Fair       Standing balance-Leahy Scale: Fair                             ADL either performed or assessed with clinical judgement   ADL Overall ADL's : Needs assistance/impaired     Grooming: Min guard;Minimal assistance;Standing;Wash/dry face;Brushing hair Grooming Details (indicate cue type and reason): for standing balance                             Functional mobility during ADLs: Minimal assistance;+2 for safety/equipment;Rolling walker General ADL Comments: pt limited due to vitals today, elevated HR, RR, and some decrease in O2 sats; tolerated mobility to seated, and standing grooming ADL after seated rest      Vision       Perception     Praxis      Cognition Arousal/Alertness: Awake/alert Behavior During Therapy: WFL for tasks assessed/performed Overall Cognitive Status: Difficult to assess                                 General Comments: Initial difficulty understanding cues for hand placment with sit to stand/satnd to sit, but with repetition, improved        Exercises     Shoulder Instructions  General Comments Noting occasional incr RR to mid to high 40s; Tachycardia (146 observed highest); O2 sats on the low side with higher percentage of O2; on 55% O2 and sats stayed in high 80s/low 90s    Pertinent Vitals/ Pain       Pain Assessment: No/denies pain  Home Living                                          Prior Functioning/Environment              Frequency  Min 3X/week        Progress Toward Goals  OT Goals(current goals can now be found in the care plan section)  Progress towards OT goals: Progressing toward goals  Acute Rehab OT Goals Patient Stated Goal: agreeable to walk and freshen up at sink OT Goal Formulation: With patient Time For  Goal Achievement: 04/19/19 Potential to Achieve Goals: Good  Plan Discharge plan remains appropriate    Co-evaluation    PT/OT/SLP Co-Evaluation/Treatment: Yes Reason for Co-Treatment: Complexity of the patient's impairments (multi-system involvement);For patient/therapist safety;To address functional/ADL transfers PT goals addressed during session: Mobility/safety with mobility OT goals addressed during session: ADL's and self-care      AM-PAC OT "6 Clicks" Daily Activity     Outcome Measure   Help from another person eating meals?: A Little Help from another person taking care of personal grooming?: A Little Help from another person toileting, which includes using toliet, bedpan, or urinal?: A Lot Help from another person bathing (including washing, rinsing, drying)?: A Lot Help from another person to put on and taking off regular upper body clothing?: A Lot Help from another person to put on and taking off regular lower body clothing?: A Lot 6 Click Score: 14    End of Session Equipment Utilized During Treatment: Rolling walker;Oxygen  OT Visit Diagnosis: Other abnormalities of gait and mobility (R26.89);Muscle weakness (generalized) (M62.81);Pain   Activity Tolerance Patient tolerated treatment well   Patient Left in chair;with call bell/phone within reach;with chair alarm set   Nurse Communication Mobility status        Time: 6222-9798 OT Time Calculation (min): 38 min  Charges: OT General Charges $OT Visit: 1 Visit OT Treatments $Self Care/Home Management : 23-37 mins  Lou Cal, OT Supplemental Rehabilitation Services Pager [phone] Office [phone]    Raymondo Band 04/14/2019, 4:28 PM

## 2019-04-14 NOTE — Progress Notes (Signed)
[place] for Lovenox Indication: suspected PE  Patient Measurements: Height: 5\' 5"  (165.1 cm) Weight: 156 lb 15.5 oz (71.2 kg) IBW/kg (Calculated) : 61.5  Vital Signs: Temp: 98.1 F (36.7 C) (06/10 0816) Temp Source: Oral (06/10 0816) BP: 122/89 (06/10 0816) Pulse Rate: 89 (06/10 0816)  Labs: Recent Labs    04/11/19 2204  04/12/19 0902 04/13/19 0449 04/14/19 0546  HGB  --    < > 8.1* 7.5* 7.7*  HCT  --   --  25.6* 24.0* 24.8*  PLT  --   --  338 334 357  HEPRLOWMOCWT 0.66  --   --   --   --   CREATININE  --   --  1.71* 1.54* 1.25*   < > = values in this interval not displayed.   Estimated Creatinine Clearance: 62.9 mL/min (A) (by C-G formula based on SCr of 1.25 mg/dL (H)).  Medical History: No past medical history on file.  Assessment: 31 YOM admitted on 02/23/2019 with ARDS due to COVID-19 who was on IV heparin for an extended period of time and transitioned to apixaban on 5/23 for r/o PE. Patient then experienced a drop in Hgb on 5/24 requiring 2u PRBCs. Apixaban was stopped but with Hgb remaining stable with no s/sx of overt bleeding, IV heparin resumed 5/25. Patient fell in room the evening of 5/26, and heparin stopped at 2300. CT head negative; thus heparin resumed 5/27 ~ 0200. On 5/30, IV heparin transitioned to SQ Lovenox. Pharmacy consulted for dosing. Note, patient developed AKI requiring CRRT, then IHD, now improved   Goal of Therapy:  Anti-Xa level 0.6-1 units/ml 4hrs after LMWH dose given Monitor platelets by anticoagulation protocol: Yes    Plan: Continue Lovenox to 60 mg sq Q 12 Follow up CBC   Tad Moore PharmD Clinical Pharmacist [phone] Please check AMION for all [place] numbers 04/08/2019 5:03 PM

## 2019-04-15 LAB — CBC
HCT: 25.6 % — ABNORMAL LOW (ref 39.0–52.0)
Hemoglobin: 7.8 g/dL — ABNORMAL LOW (ref 13.0–17.0)
MCH: 29 pg (ref 26.0–34.0)
MCHC: 30.5 g/dL (ref 30.0–36.0)
MCV: 95.2 fL (ref 80.0–100.0)
Platelets: 395 10*3/uL (ref 150–400)
RBC: 2.69 MIL/uL — ABNORMAL LOW (ref 4.22–5.81)
RDW: 14.6 % (ref 11.5–15.5)
WBC: 9.9 10*3/uL (ref 4.0–10.5)
nRBC: 0 % (ref 0.0–0.2)

## 2019-04-15 LAB — GLUCOSE, CAPILLARY
Glucose-Capillary: 95 mg/dL (ref 70–99)
Glucose-Capillary: 192 mg/dL — ABNORMAL HIGH (ref 70–99)
Glucose-Capillary: 169 mg/dL — ABNORMAL HIGH (ref 70–99)
Glucose-Capillary: 156 mg/dL — ABNORMAL HIGH (ref 70–99)

## 2019-04-15 LAB — BASIC METABOLIC PANEL
Anion gap: 11 (ref 5–15)
BUN: 38 mg/dL — ABNORMAL HIGH (ref 6–20)
CO2: 21 mmol/L — ABNORMAL LOW (ref 22–32)
Calcium: 9.6 mg/dL (ref 8.9–10.3)
Chloride: 105 mmol/L (ref 98–111)
Creatinine, Ser: 1.47 mg/dL — ABNORMAL HIGH (ref 0.61–1.24)
GFR calc Af Amer: >60 mL/min (ref >60)
GFR calc non Af Amer: 56 mL/min — ABNORMAL LOW (ref >60)
Glucose, Bld: 107 mg/dL — ABNORMAL HIGH (ref 70–99)
Potassium: 3.7 mmol/L (ref 3.5–5.1)
Sodium: 137 mmol/L (ref 135–145)

## 2019-04-15 MED ORDER — SODIUM CHLORIDE 0.9 % IV SOLN
INTRAVENOUS | Status: AC
  Administered 2019-04-15 – 2019-04-16 (×2): via INTRAVENOUS

## 2019-04-15 MED ORDER — METOPROLOL TARTRATE 12.5 MG HALF TABLET
12.5000 mg | ORAL_TABLET | Freq: Two times a day (BID) | ORAL | Status: DC
  Administered 2019-04-15 – 2019-04-17 (×5): 12.5 mg via ORAL
  Filled 2019-04-15 (×5): qty 1

## 2019-04-15 NOTE — Progress Notes (Signed)
PROGRESS NOTE  Jonathan [name] KGY:[id number] DOB: 03-17-1971 DOA: 02/23/2019 PCP: Patient, No Pcp Per  HPI/Recap of past 24 hours: Brief Narrative:48 year old Spanish-speaking male from [place], was admitted on 4/21 with increased respiratory distress, he was intubated on admission for ARDS due to SARS COVID-19 infection. -Unfortunately developed severe ATN required CRRT, prolonged respiratory failure , was extubated and reintubated earlier this month, subsequently underwent tracheostomy on 5/12 , followed by placement of a permacath on 5/13 -Ventrally liberated off the ventilator 5/21 however remained anuric requiring intermittent hemodialysis subsequently transferred to [facility] 5/26 from [facility]. -Was also started on anticoagulation while in ICU for suspected pulmonary embolism -5/29 passed swallow evaluation, his core track was removed and he was started on dysphagia diet with nectar thick liquids, last HD 5/30,hopeful for renal recovery -Repeat COVID tested negative x2 on 5/29 and 5/30 Events: 02/23/2019 admitted to the ICU. 02/24/2019 started on CRRT 4/21-4/24 Proned daily 4/28 -4/30 proned daily  03/04/2019 increase in oxygen demand required paralytic for 24 hours. 03/09/2019 hematochezia from gastric aspiration 5/11 extubated 5/12 re-intubated, tracheostomy 5/13 perm cath 5/14 worsening hypoxemia, shock suddenly, RV dilated, no new CXR changes, treated for PE empirically 5/21 doing t-piece trials 5/23: Trial off of CVVHD 5/24: 2 units PRBC 5/25 remains off of ventilator support since May 21, remains anuric, 5/26 patient transferred to [facility] from [facility] as he needed intermittent hemodialysis  5/29 passed modified swallow eval 5/30 started on nectar thick liquis and taking pills by mouth,dialysis  Lines and tubes: 4/21 - ETT >>5.11.2020 4/21 - RIJ CVC, removed 4/22 L IJ HD Cath> removed 5/13 Permacath right 6/6 [facility] DCD  Antibiotics: 02/23/2019-425 2020  azithromycin and Rocephin 02/23/2019-02/24/2019 03/06/2019 Rocephin 5/11-5/17Vancomycin 5/12-5/18Zosyn 5/12-5/17Fluconazole for candida in tracheal aspirate and recent Actemra I took over his care 04/07/2019. Overnight patient had an episode of bleeding from the pressure ulcer on his face which needed epi and lidocaine by ENT. Lovenox was stopped. He is on Lovenox for presumed pulmonary embolism.  [place] taken out 04/10/2019.  Recap last 7 days-patient remains on 35% FiO2 pulmonary following at a distance. Repeat swallow evaluation modified barium swallow was done 04/12/2019 and he was started on a regular diet with thin liquids.  This was done yesterday 04/12/2019.  After he took his first meal patient became more hypoxic and tachycardic it appears that he ate too fast and too much at one time. His renal functions improved so much that no more dialysis is needed temporary dialysis catheter was taken out 04/10/2019. Patient has bilateral cheek ulcers from pressure injury from pronating while intubated when he had COVID pneumonia. The right cheek has been healed. The left cheek had bleeding profusely when ENT had to be consulted and they injected epi and lidocaine. ENT recommends biopsy if it does not heal in 1 week. However his left cheek wound is healing slowly. He is also followed by CIR when patient is medically stable he will be discharged to CIR. He is a Psychologist, sport and exercise.His whole family is in [place].  04/15/19: Patient seen and examined at his bedside.  Tachycardic this morning.  Last twelve-lead EKG showed sinus tachycardia.  Suspect from dehydration.  Appears dry on exam.  Denies any chest pain.  On trach collar and saturation stable.   Assessment/Plan: Active Problems:   ARDS (adult respiratory distress syndrome) (HCC)   Pressure injury of skin   Acute respiratory failure with hypoxia ([place])   COVID-19   AKI (acute kidney injury) (HCC)   ATN (acute  tubular necrosis) ([place])   Tracheostomy care  ([place])   On tube feeding diet   Status post tracheostomy ([place])   History of ETT   Encounter for intubation   Acute respiratory failure with hypoxia and hypercapnia (HCC)  Acute Hypoxic Resp. Failure due to Acute Covid 19 Viral Illness/ARDS/septic shock requiring prone ventilation and nitric oxide for ARDS related to COVID-19 -Treated at [facility] with IV steroids, IV Actemra on 4/22, -Also received convalescent plasma on 5/3 -Status post tracheostomy 5/12 -Weaned off the ventilator 5/21 -Still has moderate O2 requirement with FiO2 of35% -Chest x-ray 5/30 showed low lung volumes and patchy airspace disease which was improving -CCM following intermittently for trach care, trach was downsized to #4 uncuffed [place] on 5/26.  -Patient became hypoxic tachycardic tachypneic 6/8/ 2020 chest x-ray done shows persistent bilateral patchy pulmonary infiltrates similar or slightly worsened.   -Independently reviewed chest x-ray done on 04/14/2019 which shows slight improvement in the aeration bilaterally compared to prior chest x-ray done on 04/12/2019 which was also reviewed independently. -On trach collar at 10 L and saturation 95% Maintain O2 saturation greater than 92%  Acute kidney injury due to ATN/Covid -Baseline creatinine was normal 2 months ago, he started CRRT on 4/21 and transition to intermittent hemodialysis 5/28, was undergoing HD Tuesday Thursday Saturday via TDC,his renal functions have significantly improved and dialysis has been stopped at this time. Nephrology signed off 04/11/2019 Worsening creatinine level this morning 1.47 from 1.25 yesterday Recorded urine output 350 cc in the last 24 hours We will start gentle IV fluid hydration normal saline at 50 cc/h x 1 day and monitor closely Creatinine 1.25 on 04/14/2019 from 1.54 on 04/13/2019 Repeat BMP in the morning  Persistent sinus tachycardia Heart rate has been ranging between 105 and 133 Start metoprolol tartrate 12.5 mg twice daily  Continue to closely monitor vital signs  Presumed acute PE -On 5/14 patient had a sudden decline with shock, worsening hypoxemia and echocardiogram which showed right heart strain -CT angiogram could not be obtained due to severe AKI -He was started on IV heparin for presumed PE, Dopplers were negative -3 months of anticoagulation recommended -On full dose Lovenox subcu twice daily -Lovenox restarted 04/08/2019 since he did not have any further active bleeding.  Iron deficiency anemia/anemia of chronic disease Iron studies shows iron deficiency Continue ferrous sulfate Hemoglobin stable at 7.8  Anemia due to critical illness-he had small amount of bleeding from the tracheostomy site received 3 units of blood transfusion overall during this hospital stay. He also had bleeding from the pressure ulcer on the left side of his face which has been resolved.  Dysphagia -Insetting of subacute respiratory failure, tracheostomy -SLPrecommends dysphagia 1 puree nectar thick liquid initially -Repeat swallow evaluation done on 04/12/2019 recommendations for regular solids, thin liquid.  Hyperglycemia -No clear history of diabetes, hemoglobin A1c was 5.5 -Remains on Lantus 12 units twice daily and sliding scale insulin, CBGs are less than 170  Pressure injury on the skin of nose.bilateral cheeks not present on admission Local wound care.With soap and water daily. -Appears to be healing  DVT Prophylaxis:Full dose Lovenox twice daily  PUD Prophylaxis:On famotidine/ppi Code Status:Partial code Family Communicationdiscussed with his wife in [place] call interpreter [phone] Spero Geralds if you call her she will contact you to her family and interpret for you. disposition PlanDischarge to CIR when hemodynamically stable.  Pressure Injury 03/03/19 Face Right Unstageable - Full thickness tissue loss in which the base of the ulcer is covered by  slough (yellow, tan, gray, green or brown)  and/or eschar (tan, brown or black) in the wound bed. 2cm x2 1/4 cm  red edges  with blac (Active)  03/03/19 0830  Location: Face  Location Orientation: Right  Staging: Unstageable - Full thickness tissue loss in which the base of the ulcer is covered by slough (yellow, tan, gray, green or brown) and/or eschar (tan, brown or black) in the wound bed.  Wound Description (Comments): 2cm x2 1/4 cm  red edges  with black cneter  Present on Admission: No     Pressure Injury 03/03/19 Face Left Unstageable - Full thickness tissue loss in which the base of the ulcer is covered by slough (yellow, tan, gray, green or brown) and/or eschar (tan, brown or black) in the wound bed. 1 1/2 cm x 2 1/2 cm red edges with y (Active)  03/03/19 0830  Location: Face  Location Orientation: Left  Staging: Unstageable - Full thickness tissue loss in which the base of the ulcer is covered by slough (yellow, tan, gray, green or brown) and/or eschar (tan, brown or black) in the wound bed.  Wound Description (Comments): 1 1/2 cm x 2 1/2 cm red edges with yellow center and black ring around yellow area  Present on Admission: No      Objective: Vitals:   04/15/19 0600 04/15/19 0740 04/15/19 0803 04/15/19 0927  BP:   117/85 127/73  Pulse:  99 (!) 101 (!) 131  Resp:  (!) 26 (!) 31 (!) 28  Temp:   98.3 F (36.8 C)   TempSrc:   Oral   SpO2: 98% 94% 93% 94%  Weight: 71.4 kg     Height:        Intake/Output Summary (Last 24 hours) at 04/15/2019 1125 Last data filed at 04/15/2019 1610 Gross per 24 hour  Intake 730 ml  Output 2850 ml  Net -2120 ml   Filed Weights   04/12/19 0349 04/14/19 0500 04/15/19 0600  Weight: 64.2 kg 71.2 kg 71.4 kg    Exam:  . General: 48 y.o. year-old male well-developed well-nourished no acute distress.  Alert and interactive.  Trach collar in place.   . Cardiovascular: Regular rate and rhythm with no rubs or gallops.  No JVD or thyromegaly noted. Marland Kitchen Respiratory: Mild rales  bilaterally with no wheezes noted.  Good inspiratory effort. . Abdomen: Soft nontender nondistended normal bowel sounds x4 quadrants. . Musculoskeletal: No lower extremity edema.  2 out of 4 pulses in all 4 extremities.  Marland Kitchen Psychiatry: Mood is appropriate for condition and setting.   Data Reviewed: CBC: Recent Labs  Lab 04/11/19 0649 04/12/19 0902 04/13/19 0449 04/14/19 0546 04/15/19 0517  WBC 10.8* 8.7 10.1 10.0 9.9  HGB 7.8* 8.1* 7.5* 7.7* 7.8*  HCT 24.5* 25.6* 24.0* 24.8* 25.6*  MCV 92.8 94.8 93.8 94.7 95.2  PLT 325 338 334 357 960   Basic Metabolic Panel: Recent Labs  Lab 04/10/19 0450 04/11/19 0649 04/12/19 0902 04/13/19 0449 04/14/19 0546 04/15/19 0517  NA 136 137 134* 136 138 137  K 3.5 3.5 3.0* 3.4* 3.4* 3.7  CL 102 103 102 103 105 105  CO2 24 25 22 22 23  21*  GLUCOSE 139* 114* 227* 111* 105* 107*  BUN 63* 52* 42* 38* 33* 38*  CREATININE 2.35* 2.00* 1.71* 1.54* 1.25* 1.47*  CALCIUM 10.4* 10.3 10.0 9.7 9.9 9.6  PHOS 3.3 3.1 3.4 3.6 4.1  --    GFR: Estimated Creatinine Clearance: 53.5 mL/min (A) (by  C-G formula based on SCr of 1.47 mg/dL (H)). Liver Function Tests: Recent Labs  Lab 04/10/19 0450 04/11/19 0649 04/12/19 0902 04/13/19 0449 04/14/19 0546  AST  --  24 23 26 21   ALT  --  29 29 30 29   ALKPHOS  --  117 128* 121 120  BILITOT  --  0.5 0.4 0.6 0.3  PROT  --  6.6 6.8 6.5 6.7  ALBUMIN 2.6* 2.7* 2.8* 2.6* 2.7*   No results for input(s): LIPASE, AMYLASE in the last 168 hours. No results for input(s): AMMONIA in the last 168 hours. Coagulation Profile: No results for input(s): INR, PROTIME in the last 168 hours. Cardiac Enzymes: No results for input(s): CKTOTAL, CKMB, CKMBINDEX, TROPONINI in the last 168 hours. BNP (last 3 results) No results for input(s): PROBNP in the last 8760 hours. HbA1C: No results for input(s): HGBA1C in the last 72 hours. CBG: Recent Labs  Lab 04/14/19 0815 04/14/19 1240 04/14/19 1746 04/14/19 2159 04/15/19 0805   GLUCAP 111* 130* 143* 159* 95   Lipid Profile: No results for input(s): CHOL, HDL, LDLCALC, TRIG, CHOLHDL, LDLDIRECT in the last 72 hours. Thyroid Function Tests: No results for input(s): TSH, T4TOTAL, FREET4, T3FREE, THYROIDAB in the last 72 hours. Anemia Panel: No results for input(s): VITAMINB12, FOLATE, FERRITIN, TIBC, IRON, RETICCTPCT in the last 72 hours. Urine analysis:    Component Value Date/Time   COLORURINE AMBER (A) 02/24/2019 0054   APPEARANCEUR HAZY (A) 02/24/2019 0054   LABSPEC 1.024 02/24/2019 0054   PHURINE 5.0 02/24/2019 0054   GLUCOSEU 50 (A) 02/24/2019 0054   HGBUR MODERATE (A) 02/24/2019 0054   BILIRUBINUR NEGATIVE 02/24/2019 0054   KETONESUR 5 (A) 02/24/2019 0054   PROTEINUR 100 (A) 02/24/2019 0054   NITRITE NEGATIVE 02/24/2019 0054   LEUKOCYTESUR NEGATIVE 02/24/2019 0054   Sepsis Labs: @LABRCNTIP (procalcitonin:4,lacticidven:4)  )No results found for this or any previous visit (from the past 240 hour(s)).    Studies: No results found.  Scheduled Meds: . chlorhexidine  15 mL Mouth Rinse BID  . Chlorhexidine Gluconate Cloth  6 each Topical Q0600  . clonazepam  0.25 mg Oral Daily  . darbepoetin (ARANESP) injection - NON-DIALYSIS  100 mcg Subcutaneous Q Mon-1800  . enoxaparin (LOVENOX) injection  60 mg Subcutaneous Q12H  . feeding supplement (ENSURE ENLIVE)  237 mL Oral BID BM  . feeding supplement (PRO-STAT SUGAR FREE 64)  30 mL Oral TID  . ferrous sulfate  325 mg Oral Q breakfast  . guaiFENesin  5 mL Oral BID  . insulin aspart  0-9 Units Subcutaneous TID WC  . insulin aspart  6 Units Subcutaneous TID WC  . insulin glargine  12 Units Subcutaneous BID  . levalbuterol  0.63 mg Nebulization Q6H  . mouth rinse  15 mL Mouth Rinse q12n4p  . metoprolol tartrate  12.5 mg Oral BID  . mupirocin cream   Topical BID  . pantoprazole  40 mg Oral BID  . senna-docusate  1 tablet Oral BID  . sodium chloride flush  10-40 mL Intracatheter Q12H    Continuous  Infusions: . sodium chloride Stopped (03/21/19 2128)     LOS: 51 days     Kayleen Memos, MD [facility] Pager [phone]  If 7PM-7AM, please contact night-coverage [email or web address] Password [facility] 04/15/2019, 11:25 AM

## 2019-04-15 NOTE — Progress Notes (Signed)
Inpatient Rehabilitation Admissions Coordinator  I continue to follow pt's progress. On O2 60 % Fi02 since 6/8 at 1836 and receiving IV Lopressor prn for elevated HR. I await medical readiness to admit to CIR. We take 35 % max Fi02.   Danne Baxter, RN, MSN Rehab Admissions Coordinator [phone] 04/15/2019 11:52 AM

## 2019-04-16 LAB — GLUCOSE, CAPILLARY
Glucose-Capillary: 114 mg/dL — ABNORMAL HIGH (ref 70–99)
Glucose-Capillary: 145 mg/dL — ABNORMAL HIGH (ref 70–99)
Glucose-Capillary: 138 mg/dL — ABNORMAL HIGH (ref 70–99)
Glucose-Capillary: 296 mg/dL — ABNORMAL HIGH (ref 70–99)

## 2019-04-16 MED ORDER — LEVALBUTEROL TARTRATE 45 MCG/ACT IN AERO
2.0000 | INHALATION_SPRAY | Freq: Four times a day (QID) | RESPIRATORY_TRACT | Status: DC
  Administered 2019-04-16 – 2019-04-18 (×8): 2 via RESPIRATORY_TRACT
  Filled 2019-04-16: qty 15

## 2019-04-16 MED ORDER — LEVALBUTEROL TARTRATE 45 MCG/ACT IN AERO
2.0000 | INHALATION_SPRAY | Freq: Four times a day (QID) | RESPIRATORY_TRACT | Status: DC | PRN
  Administered 2019-04-17: 2 via RESPIRATORY_TRACT

## 2019-04-16 MED ORDER — IPRATROPIUM BROMIDE HFA 17 MCG/ACT IN AERS
2.0000 | INHALATION_SPRAY | Freq: Four times a day (QID) | RESPIRATORY_TRACT | Status: DC
  Administered 2019-04-16 – 2019-04-18 (×9): 2 via RESPIRATORY_TRACT
  Filled 2019-04-16: qty 12.9

## 2019-04-16 NOTE — Progress Notes (Addendum)
Physical Therapy Treatment Patient Details Name: Jonathan [name] MRN: [id number] DOB: Apr 21, 1971 Today's Date: 04/16/2019    History of Present Illness 48 y.o. male admitted on 02/23/19 with AMS, O2 sats in the ED were 50%.  Pt dx with COVID 19 and resultant ARDS.  He was intubated 4/21-5/11. Reintubated and trached 5/12. Off vent and on Tbar since 5/21.  His course is complicated by acute kidney injury requiring CVVHD.  Pt transferred back to [facility] on 5/26 and changed to intermittent HD. Pt tested Covid negative 5/29 and 5/30.     PT Comments    Pt making excellent progress. Continue to feel he will be an excellent rehab candidate.    Follow Up Recommendations  CIR     Equipment Recommendations  Other (comment)(rollator)    Recommendations for Other Services       Precautions / Restrictions Precautions Precautions: Fall Precaution Comments: trach now with PMSV Restrictions Weight Bearing Restrictions: No    Mobility  Bed Mobility               General bed mobility comments: pt received OOB in recliner  Transfers Overall transfer level: Needs assistance Equipment used: 4-wheeled walker Transfers: Sit to/from Stand Sit to Stand: Min assist;Min guard;+2 safety/equipment         General transfer comment: Intermittent verbal cues for hand placement. Assist to steady.  Ambulation/Gait Ambulation/Gait assistance: Min guard;+2 safety/equipment Gait Distance (Feet): 140 Feet Assistive device: 4-wheeled walker Gait Pattern/deviations: Step-through pattern;Decreased stride length;Narrow base of support Gait velocity: decr Gait velocity interpretation: <1.31 ft/sec, indicative of household ambulator General Gait Details: Assist for safety and balance. Pt on 55% O2 with SpO2 to 85% with amb. Standing rest break with SpO2 returning to 90%. On return to room SpO2 86%. Returned to >90% after 1 minute.  HR to 130's with amb   Stairs             Wheelchair  Mobility    Modified Rankin (Stroke Patients Only)       Balance Overall balance assessment: Needs assistance Sitting-balance support: No upper extremity supported;Feet supported Sitting balance-Leahy Scale: Fair     Standing balance support: Single extremity supported Standing balance-Leahy Scale: Fair Standing balance comment: UE assist                            Cognition Arousal/Alertness: Awake/alert Behavior During Therapy: WFL for tasks assessed/performed Overall Cognitive Status: Difficult to assess                                 General Comments: following commands well, pt continues to have increased awareness asking about his HR today      Exercises      General Comments General comments (skin integrity, edema, etc.): Video translator [phone] eduardo      Pertinent Vitals/Pain Pain Assessment: No/denies pain    Home Living                      Prior Function            PT Goals (current goals can now be found in the care plan section) Acute Rehab PT Goals Patient Stated Goal: agreeable to walk and freshen up at sink Progress towards PT goals: Progressing toward goals    Frequency    Min 3X/week      PT  Plan Current plan remains appropriate    Co-evaluation   Reason for Co-Treatment: Complexity of the patient's impairments (multi-system involvement);For patient/therapist safety;To address functional/ADL transfers   OT goals addressed during session: ADL's and self-care      AM-PAC PT "6 Clicks" Mobility   Outcome Measure  Help needed turning from your back to your side while in a flat bed without using bedrails?: A Little Help needed moving from lying on your back to sitting on the side of a flat bed without using bedrails?: A Little Help needed moving to and from a bed to a chair (including a wheelchair)?: A Little Help needed standing up from a chair using your arms (e.g., wheelchair or bedside  chair)?: A Little Help needed to walk in hospital room?: A Little Help needed climbing 3-5 steps with a railing? : A Lot 6 Click Score: 17    End of Session Equipment Utilized During Treatment: Gait belt;Oxygen Activity Tolerance: Patient tolerated treatment well Patient left: in chair;with call bell/phone within reach;with chair alarm set   PT Visit Diagnosis: Muscle weakness (generalized) (M62.81);Difficulty in walking, not elsewhere classified (R26.2)     Time: 4888-9169 PT Time Calculation (min) (ACUTE ONLY): 28 min  Charges:  $Gait Training: 8-22 mins                     Chapin Pager [phone] Office [place] 04/16/2019, 2:34 PM

## 2019-04-16 NOTE — Progress Notes (Signed)
PROGRESS NOTE  Jonathan [name] WUJ:[id number] DOB: 07/15/1971 DOA: 02/23/2019 PCP: Patient, No Pcp Per  HPI/Recap of past 24 hours: Brief Narrative:48 year old Spanish-speaking male from [place], was admitted on 4/21 with increased respiratory distress, he was intubated on admission for ARDS due to SARS COVID-19 infection. -Unfortunately developed severe ATN required CRRT, prolonged respiratory failure , was extubated and reintubated earlier this month, subsequently underwent tracheostomy on 5/12 , followed by placement of a permacath on 5/13 -Ventrally liberated off the ventilator 5/21 however remained anuric requiring intermittent hemodialysis subsequently transferred to [facility] 5/26 from [facility]. -Was also started on anticoagulation while in ICU for suspected pulmonary embolism -5/29 passed swallow evaluation, his core track was removed and he was started on dysphagia diet with nectar thick liquids, last HD 5/30,hopeful for renal recovery -Repeat COVID tested negative x2 on 5/29 and 5/30 Events: 02/23/2019 admitted to the ICU. 02/24/2019 started on CRRT 4/21-4/24 Proned daily 4/28 -4/30 proned daily  03/04/2019 increase in oxygen demand required paralytic for 24 hours. 03/09/2019 hematochezia from gastric aspiration 5/11 extubated 5/12 re-intubated, tracheostomy 5/13 perm cath 5/14 worsening hypoxemia, shock suddenly, RV dilated, no new CXR changes, treated for PE empirically 5/21 doing t-piece trials 5/23: Trial off of CVVHD 5/24: 2 units PRBC 5/25 remains off of ventilator support since May 21, remains anuric, 5/26 patient transferred to [facility] from [facility] as he needed intermittent hemodialysis  5/29 passed modified swallow eval 5/30 started on nectar thick liquis and taking pills by mouth,dialysis  Lines and tubes: 4/21 - ETT >>5.11.2020 4/21 - RIJ CVC, removed 4/22 L IJ HD Cath> removed 5/13 Permacath right 6/6 [facility] DCD  Antibiotics: 02/23/2019-425 2020  azithromycin and Rocephin 02/23/2019-02/24/2019 03/06/2019 Rocephin 5/11-5/17Vancomycin 5/12-5/18Zosyn 5/12-5/17Fluconazole for candida in tracheal aspirate and recent Actemra I took over his care 04/07/2019. Overnight patient had an episode of bleeding from the pressure ulcer on his face which needed epi and lidocaine by ENT. Lovenox was stopped. He is on Lovenox for presumed pulmonary embolism.  [place] taken out 04/10/2019.  Recap last 7 days-patient remains on 35% FiO2 pulmonary following at a distance. Repeat swallow evaluation modified barium swallow was done 04/12/2019 and he was started on a regular diet with thin liquids.  This was done yesterday 04/12/2019.  After he took his first meal patient became more hypoxic and tachycardic it appears that he ate too fast and too much at one time. His renal functions improved so much that no more dialysis is needed temporary dialysis catheter was taken out 04/10/2019. Patient has bilateral cheek ulcers from pressure injury from pronating while intubated when he had COVID pneumonia. The right cheek has been healed. The left cheek had bleeding profusely when ENT had to be consulted and they injected epi and lidocaine. ENT recommends biopsy if it does not heal in 1 week. However his left cheek wound is healing slowly. He is also followed by CIR when patient is medically stable he will be discharged to CIR. He is a Psychologist, sport and exercise.His whole family is in [place].  04/16/19: Patient seen and examined at bedside.  Intermittent cough on exam but states his breathing is improved.  Still requiring high level of oxygen.  Goal 35% max FiO2 for placement.  Will wean down oxygen as tolerated.   Assessment/Plan: Active Problems:   ARDS (adult respiratory distress syndrome) (HCC)   Pressure injury of skin   Acute respiratory failure with hypoxia ([place])   COVID-19   AKI (acute kidney injury) (HCC)   ATN (acute tubular  necrosis) ([place])   Tracheostomy care ([place])   On  tube feeding diet   Status post tracheostomy ([place])   History of ETT   Encounter for intubation   Acute respiratory failure with hypoxia and hypercapnia (HCC)  Acute Hypoxic Resp. Failure due to Covid 19 Viral Illness/ARDS/septic shock requiring prone ventilation and nitric oxide for ARDS related to COVID-19 -Treated at [facility] with IV steroids, IV Actemra on 4/22, -Also received convalescent plasma on 5/3 -Status post tracheostomy 5/12 -Weaned off the ventilator 5/21 -Still has moderate O2 requirement with FiO2 of35% -Chest x-ray 5/30 showed low lung volumes and patchy airspace disease which was improving -CCM following intermittently for trach care, trach was downsized to #4 uncuffed [place] on 5/26.  -Patient became hypoxic tachycardic tachypneic 6/8/ 2020 chest x-ray done shows persistent bilateral patchy pulmonary infiltrates similar or slightly worsened.   -Independently reviewed chest x-ray done on 04/14/2019 which shows slight improvement in the aeration bilaterally compared to prior chest x-ray done on 04/12/2019 which was also reviewed independently. -On trach collar at 10 L and saturation 95% -Wean down oxygen supplementation as tolerated -Goal FiO2 35% max. -Maintain O2 saturation greater than 92%.  Acute kidney injury due to ATN/Covid -Baseline creatinine was normal 2 months ago, he started CRRT on 4/21 and transition to intermittent hemodialysis 5/28, was undergoing HD Tuesday Thursday Saturday via TDC,his renal functions have significantly improved and dialysis has been stopped at this time. Nephrology signed off 04/11/2019 Worsening creatinine level on 04/15/2019 1.47 from 1.25 yesterday Urine output 2520 cc in the last 24 hours. Obtain BMP tomorrow  Persistent sinus tachycardia Heart rate has been ranging between 105 and 133 Continue metoprolol tartrate 12.5 mg twice daily  Continue to closely monitor vital signs   Presumed acute PE -On 5/14 patient had a sudden decline  with shock, worsening hypoxemia and echocardiogram which showed right heart strain -CT angiogram could not be obtained due to severe AKI -He was started on IV heparin for presumed PE, Dopplers were negative -3 months of anticoagulation recommended -On full dose Lovenox subcu twice daily -Lovenox restarted 04/08/2019 since he did not have any further active bleeding.  Iron deficiency anemia/anemia of chronic disease Iron studies shows iron deficiency Continue ferrous sulfate Hemoglobin stable at 7.8  Anemia due to critical illness-he had small amount of bleeding from the tracheostomy site received 3 units of blood transfusion overall during this hospital stay. He also had bleeding from the pressure ulcer on the left side of his face which has been resolved.  Dysphagia -Insetting of subacute respiratory failure, tracheostomy -SLPrecommends dysphagia 1 puree nectar thick liquid initially -Repeat swallow evaluation done on 04/12/2019 recommendations for regular solids, thin liquid.  Hyperglycemia -No clear history of diabetes, hemoglobin A1c was 5.5 -Remains on Lantus 12 units twice daily and sliding scale insulin, CBGs are less than 170  Pressure injury on the skin of nose.bilateral cheeks not present on admission Local wound care.With soap and water daily. -Appears to be healing  DVT Prophylaxis:Full dose Lovenox twice daily  PUD Prophylaxis:On famotidine/ppi Code Status:Partial code Family Communicationdiscussed with his wife in [place] call interpreter [phone] Spero Geralds if you call her she will contact you to her family and interpret for you. disposition PlanDischarge to CIR when FiO2 less than or equal to 35%.  Pressure Injury 03/03/19 Face Right Unstageable - Full thickness tissue loss in which the base of the ulcer is covered by slough (yellow, tan, gray, green or brown) and/or eschar (tan, brown or black) in  the wound bed. 2cm x2 1/4 cm  red edges  with blac  (Active)  03/03/19 0830  Location: Face  Location Orientation: Right  Staging: Unstageable - Full thickness tissue loss in which the base of the ulcer is covered by slough (yellow, tan, gray, green or brown) and/or eschar (tan, brown or black) in the wound bed.  Wound Description (Comments): 2cm x2 1/4 cm  red edges  with black cneter  Present on Admission: No     Pressure Injury 03/03/19 Face Left Unstageable - Full thickness tissue loss in which the base of the ulcer is covered by slough (yellow, tan, gray, green or brown) and/or eschar (tan, brown or black) in the wound bed. 1 1/2 cm x 2 1/2 cm red edges with y (Active)  03/03/19 0830  Location: Face  Location Orientation: Left  Staging: Unstageable - Full thickness tissue loss in which the base of the ulcer is covered by slough (yellow, tan, gray, green or brown) and/or eschar (tan, brown or black) in the wound bed.  Wound Description (Comments): 1 1/2 cm x 2 1/2 cm red edges with yellow center and black ring around yellow area  Present on Admission: No      Objective: Vitals:   04/16/19 0805 04/16/19 0852 04/16/19 1106 04/16/19 1205  BP: (!) 146/94   (!) 122/97  Pulse: (!) 102 (!) 105 97 (!) 106  Resp: (!) 28 (!) 22 20 (!) 22  Temp:    98.2 F (36.8 C)  TempSrc:    Oral  SpO2: 98% 99% 94% 98%  Weight:      Height:        Intake/Output Summary (Last 24 hours) at 04/16/2019 1222 Last data filed at 04/16/2019 7169 Gross per 24 hour  Intake 2037.16 ml  Output 2170 ml  Net -132.84 ml   Filed Weights   04/14/19 0500 04/15/19 0600 04/16/19 0318  Weight: 71.2 kg 71.4 kg 72.8 kg    Exam:  . General: 48 y.o. year-old male pleasant well-developed well-nourished no acute distress.  Alert and interactive.:  Trach in place. . Cardiovascular: Regular rate and rhythm with no rubs or gallops.  No JVD or thyromegaly noted. Marland Kitchen Respiratory: Mild rales at bases with no wheezes or rales.  Good respiratory effort.   . Abdomen: Soft  nondistended normal tender with normal bowel sounds x4 quadrants. . Psychiatry: Mood is appropriate condition and setting.   Data Reviewed: CBC: Recent Labs  Lab 04/11/19 0649 04/12/19 0902 04/13/19 0449 04/14/19 0546 04/15/19 0517  WBC 10.8* 8.7 10.1 10.0 9.9  HGB 7.8* 8.1* 7.5* 7.7* 7.8*  HCT 24.5* 25.6* 24.0* 24.8* 25.6*  MCV 92.8 94.8 93.8 94.7 95.2  PLT 325 338 334 357 678   Basic Metabolic Panel: Recent Labs  Lab 04/10/19 0450 04/11/19 0649 04/12/19 0902 04/13/19 0449 04/14/19 0546 04/15/19 0517  NA 136 137 134* 136 138 137  K 3.5 3.5 3.0* 3.4* 3.4* 3.7  CL 102 103 102 103 105 105  CO2 24 25 22 22 23  21*  GLUCOSE 139* 114* 227* 111* 105* 107*  BUN 63* 52* 42* 38* 33* 38*  CREATININE 2.35* 2.00* 1.71* 1.54* 1.25* 1.47*  CALCIUM 10.4* 10.3 10.0 9.7 9.9 9.6  PHOS 3.3 3.1 3.4 3.6 4.1  --    GFR: Estimated Creatinine Clearance: 53.5 mL/min (A) (by C-G formula based on SCr of 1.47 mg/dL (H)). Liver Function Tests: Recent Labs  Lab 04/10/19 9381 04/11/19 0175 04/12/19 0902 04/13/19 0449 04/14/19 1025  AST  --  24 23 26 21   ALT  --  29 29 30 29   ALKPHOS  --  117 128* 121 120  BILITOT  --  0.5 0.4 0.6 0.3  PROT  --  6.6 6.8 6.5 6.7  ALBUMIN 2.6* 2.7* 2.8* 2.6* 2.7*   No results for input(s): LIPASE, AMYLASE in the last 168 hours. No results for input(s): AMMONIA in the last 168 hours. Coagulation Profile: No results for input(s): INR, PROTIME in the last 168 hours. Cardiac Enzymes: No results for input(s): CKTOTAL, CKMB, CKMBINDEX, TROPONINI in the last 168 hours. BNP (last 3 results) No results for input(s): PROBNP in the last 8760 hours. HbA1C: No results for input(s): HGBA1C in the last 72 hours. CBG: Recent Labs  Lab 04/15/19 1201 04/15/19 1624 04/15/19 2103 04/16/19 0800 04/16/19 1200  GLUCAP 192* 169* 156* 114* 145*   Lipid Profile: No results for input(s): CHOL, HDL, LDLCALC, TRIG, CHOLHDL, LDLDIRECT in the last 72 hours. Thyroid  Function Tests: No results for input(s): TSH, T4TOTAL, FREET4, T3FREE, THYROIDAB in the last 72 hours. Anemia Panel: No results for input(s): VITAMINB12, FOLATE, FERRITIN, TIBC, IRON, RETICCTPCT in the last 72 hours. Urine analysis:    Component Value Date/Time   COLORURINE AMBER (A) 02/24/2019 0054   APPEARANCEUR HAZY (A) 02/24/2019 0054   LABSPEC 1.024 02/24/2019 0054   PHURINE 5.0 02/24/2019 0054   GLUCOSEU 50 (A) 02/24/2019 0054   HGBUR MODERATE (A) 02/24/2019 0054   BILIRUBINUR NEGATIVE 02/24/2019 0054   KETONESUR 5 (A) 02/24/2019 0054   PROTEINUR 100 (A) 02/24/2019 0054   NITRITE NEGATIVE 02/24/2019 0054   LEUKOCYTESUR NEGATIVE 02/24/2019 0054   Sepsis Labs: @LABRCNTIP (procalcitonin:4,lacticidven:4)  )No results found for this or any previous visit (from the past 240 hour(s)).    Studies: No results found.  Scheduled Meds: . chlorhexidine  15 mL Mouth Rinse BID  . Chlorhexidine Gluconate Cloth  6 each Topical Q0600  . clonazepam  0.25 mg Oral Daily  . darbepoetin (ARANESP) injection - NON-DIALYSIS  100 mcg Subcutaneous Q Mon-1800  . enoxaparin (LOVENOX) injection  60 mg Subcutaneous Q12H  . feeding supplement (ENSURE ENLIVE)  237 mL Oral BID BM  . feeding supplement (PRO-STAT SUGAR FREE 64)  30 mL Oral TID  . ferrous sulfate  325 mg Oral Q breakfast  . guaiFENesin  5 mL Oral BID  . insulin aspart  0-9 Units Subcutaneous TID WC  . insulin aspart  6 Units Subcutaneous TID WC  . insulin glargine  12 Units Subcutaneous BID  . ipratropium  2 puff Inhalation Q6H  . levalbuterol  2 puff Inhalation Q6H  . mouth rinse  15 mL Mouth Rinse q12n4p  . metoprolol tartrate  12.5 mg Oral BID  . mupirocin cream   Topical BID  . pantoprazole  40 mg Oral BID  . senna-docusate  1 tablet Oral BID  . sodium chloride flush  10-40 mL Intracatheter Q12H    Continuous Infusions: . sodium chloride Stopped (03/21/19 2128)     LOS: 52 days     Kayleen Memos, MD [facility]  Pager [phone]  If 7PM-7AM, please contact night-coverage [email or web address] Password [facility] 04/16/2019, 12:22 PM

## 2019-04-16 NOTE — Progress Notes (Signed)
Occupational Therapy Treatment Patient Details Name: Jonathan [name] MRN: [id number] DOB: January 19, 1971 Today's Date: 04/16/2019    History of present illness 48 y.o. male admitted on 02/23/19 with AMS, O2 sats in the ED were 50%.  Pt dx with COVID 19 and resultant ARDS.  He was intubated 4/21-5/11. Reintubated and trached 5/12. Off vent and on Tbar since 5/21.  His course is complicated by acute kidney injury requiring CVVHD.  Pt transferred back to [facility] on 5/26 and changed to intermittent HD. Pt tested Covid negative 5/29 and 5/30.    OT comments  Pt with improvements in activity tolerance today, presents seated in recliner agreeable to therapy session. He completed room and hallway level mobility using rollator with minA (+2 safety), x1 seated rest break after standing grooming ADL and x1 standing rest break with mobility into hallway. Pt completing standing grooming ADL with overall minA for standing balance. Pt on supplemental O2 using trach collar, FiO2 55% (15L); lowest O2 sat noted 84% with standing activity, HR up to mid 130s. POC remains appropriate at this time. Will continue to follow acutely. [facility] interpreter used during session).   Follow Up Recommendations  CIR    Equipment Recommendations  3 in 1 bedside commode          Precautions / Restrictions Precautions Precautions: Fall Precaution Comments: trach now with PMSV, R IJ HD cath now removed Restrictions Weight Bearing Restrictions: No       Mobility Bed Mobility               General bed mobility comments: pt received OOB in recliner  Transfers Overall transfer level: Needs assistance Equipment used: 4-wheeled walker Transfers: Sit to/from Stand Sit to Stand: Min assist;Min guard;+2 safety/equipment         General transfer comment: steadying assist to rise to rollator; intermittent cues for safe hand placement but with improvements in carry over noted     Balance     Sitting  balance-Leahy Scale: Fair       Standing balance-Leahy Scale: Fair                             ADL either performed or assessed with clinical judgement   ADL Overall ADL's : Needs assistance/impaired     Grooming: Min guard;Minimal assistance;Standing;Wash/dry face Grooming Details (indicate cue type and reason): for standing balance                             Functional mobility during ADLs: Minimal assistance;+2 for safety/equipment;Rolling walker General ADL Comments: pt continues to demonstrate improvements with activity tolerance     Vision       Perception     Praxis      Cognition Arousal/Alertness: Awake/alert Behavior During Therapy: WFL for tasks assessed/performed Overall Cognitive Status: Difficult to assess                                 General Comments: following commands well, pt continues to have increased awareness asking about his HR today        Exercises     Shoulder Instructions       General Comments HR up to 130s today; lowest O2 at 84% on 55% trach collar (15L)    Pertinent Vitals/ Pain       Pain  Assessment: No/denies pain  Home Living                                          Prior Functioning/Environment              Frequency  Min 3X/week        Progress Toward Goals  OT Goals(current goals can now be found in the care plan section)  Progress towards OT goals: Progressing toward goals  Acute Rehab OT Goals Patient Stated Goal: agreeable to walk and freshen up at sink OT Goal Formulation: With patient Time For Goal Achievement: 04/19/19 Potential to Achieve Goals: Good  Plan Discharge plan remains appropriate    Co-evaluation    PT/OT/SLP Co-Evaluation/Treatment: Yes Reason for Co-Treatment: Complexity of the patient's impairments (multi-system involvement);For patient/therapist safety;To address functional/ADL transfers   OT goals addressed during  session: ADL's and self-care      AM-PAC OT "6 Clicks" Daily Activity     Outcome Measure   Help from another person eating meals?: A Little Help from another person taking care of personal grooming?: A Little Help from another person toileting, which includes using toliet, bedpan, or urinal?: A Lot Help from another person bathing (including washing, rinsing, drying)?: A Lot Help from another person to put on and taking off regular upper body clothing?: A Lot Help from another person to put on and taking off regular lower body clothing?: A Lot 6 Click Score: 14    End of Session Equipment Utilized During Treatment: Rolling walker;Oxygen  OT Visit Diagnosis: Other abnormalities of gait and mobility (R26.89);Muscle weakness (generalized) (M62.81)   Activity Tolerance Patient tolerated treatment well   Patient Left in chair;with call bell/phone within reach;with chair alarm set   Nurse Communication Mobility status        Time: 1020-1047 OT Time Calculation (min): 27 min  Charges: OT General Charges $OT Visit: 1 Visit OT Treatments $Self Care/Home Management : 8-22 mins  Lou Cal, OT [facility] Pager [phone] Office [phone]    Raymondo Band 04/16/2019, 1:35 PM

## 2019-04-17 LAB — GLUCOSE, CAPILLARY
Glucose-Capillary: 137 mg/dL — ABNORMAL HIGH (ref 70–99)
Glucose-Capillary: 192 mg/dL — ABNORMAL HIGH (ref 70–99)
Glucose-Capillary: 145 mg/dL — ABNORMAL HIGH (ref 70–99)
Glucose-Capillary: 124 mg/dL — ABNORMAL HIGH (ref 70–99)

## 2019-04-17 LAB — CREATININE, SERUM
Creatinine, Ser: 1.05 mg/dL (ref 0.61–1.24)
GFR calc Af Amer: >60 mL/min (ref >60)
GFR calc non Af Amer: >60 mL/min (ref >60)

## 2019-04-17 MED ORDER — METOPROLOL TARTRATE 25 MG PO TABS
25.0000 mg | ORAL_TABLET | Freq: Two times a day (BID) | ORAL | Status: DC
  Administered 2019-04-17 – 2019-04-19 (×4): 25 mg via ORAL
  Filled 2019-04-17 (×4): qty 1

## 2019-04-17 MED ORDER — METOPROLOL TARTRATE 12.5 MG HALF TABLET
12.5000 mg | ORAL_TABLET | Freq: Once | ORAL | Status: AC
  Administered 2019-04-17: 12.5 mg via ORAL
  Filled 2019-04-17: qty 1

## 2019-04-17 NOTE — Progress Notes (Signed)
[place] for Lovenox Indication: suspected PE  Patient Measurements: Height: 5\' 5"  (165.1 cm) Weight: 145 lb 15.1 oz (66.2 kg) IBW/kg (Calculated) : 61.5  Vital Signs: Temp: 98.9 F (37.2 C) (06/13 0300) Temp Source: Oral (06/13 0300) BP: 130/86 (06/13 0430) Pulse Rate: 96 (06/13 0430)  Labs: Recent Labs    04/15/19 0517  HGB 7.8*  HCT 25.6*  PLT 395  CREATININE 1.47*   Estimated Creatinine Clearance: 53.5 mL/min (A) (by C-G formula based on SCr of 1.47 mg/dL (H)).  Medical History: No past medical history on file.  Assessment: 59 YOM admitted on 02/23/2019 with ARDS due to COVID-19 who was on IV heparin for an extended period of time and transitioned to apixaban on 5/23 for r/o PE. Patient then experienced a drop in Hgb on 5/24 requiring 2u PRBCs. Apixaban was stopped but with Hgb remaining stable with no s/sx of overt bleeding, IV heparin resumed 5/25. Patient fell in room the evening of 5/26, and heparin stopped at 2300. CT head negative; thus heparin resumed 5/27 ~ 0200. On 5/30, IV heparin transitioned to SQ Lovenox. Pharmacy consulted for dosing. Note, patient developed AKI requiring CRRT, then IHD, now improved   Goal of Therapy:  Anti-Xa level 0.6-1 units/ml 4hrs after LMWH dose given Monitor platelets by anticoagulation protocol: Yes    Plan: Continue Lovenox to 60 mg sq Q 12 Follow up CBC   Rome Schlauch A. Levada Dy, PharmD, [place] Please utilize Amion for appropriate phone number to reach the unit pharmacist ([place])   04/08/2019 5:03 PM

## 2019-04-17 NOTE — Progress Notes (Signed)
PROGRESS NOTE  Taiki Buckwalter NWG:[id number] DOB: 1970/11/12 DOA: 02/23/2019 PCP: Patient, No Pcp Per  HPI/Recap of past 24 hours: Brief Narrative:48 year old Spanish-speaking male from [place], was admitted on 4/21 with increased respiratory distress, he was intubated on admission for ARDS due to SARS COVID-19 infection. -Unfortunately developed severe ATN required CRRT, prolonged respiratory failure , was extubated and reintubated earlier this month, subsequently underwent tracheostomy on 5/12 , followed by placement of a permacath on 5/13 -Ventrally liberated off the ventilator 5/21 however remained anuric requiring intermittent hemodialysis subsequently transferred to [facility] 5/26 from [facility]. -Was also started on anticoagulation while in ICU for suspected pulmonary embolism -5/29 passed swallow evaluation, his core track was removed and he was started on dysphagia diet with nectar thick liquids, last HD 5/30,hopeful for renal recovery -Repeat COVID tested negative x2 on 5/29 and 5/30 Events: 02/23/2019 admitted to the ICU. 02/24/2019 started on CRRT 4/21-4/24 Proned daily 4/28 -4/30 proned daily  03/04/2019 increase in oxygen demand required paralytic for 24 hours. 03/09/2019 hematochezia from gastric aspiration 5/11 extubated 5/12 re-intubated, tracheostomy 5/13 perm cath 5/14 worsening hypoxemia, shock suddenly, RV dilated, no new CXR changes, treated for PE empirically 5/21 doing t-piece trials 5/23: Trial off of CVVHD 5/24: 2 units PRBC 5/25 remains off of ventilator support since May 21, remains anuric, 5/26 patient transferred to [facility] from [facility] as he needed intermittent hemodialysis  5/29 passed modified swallow eval 5/30 started on nectar thick liquis and taking pills by mouth,dialysis  Lines and tubes: 4/21 - ETT >>5.11.2020 4/21 - RIJ CVC, removed 4/22 L IJ HD Cath> removed 5/13 Permacath right 6/6 [facility] DCD  Antibiotics: 02/23/2019-425 2020  azithromycin and Rocephin 02/23/2019-02/24/2019 03/06/2019 Rocephin 5/11-5/17Vancomycin 5/12-5/18Zosyn 5/12-5/17Fluconazole for candida in tracheal aspirate and recent Actemra I took over his care 04/07/2019. Overnight patient had an episode of bleeding from the pressure ulcer on his face which needed epi and lidocaine by ENT. Lovenox was stopped. He is on Lovenox for presumed pulmonary embolism.  [place] taken out 04/10/2019.  Recap last 7 days-patient remains on 35% FiO2 pulmonary following at a distance. Repeat swallow evaluation modified barium swallow was done 04/12/2019 and he was started on a regular diet with thin liquids.  This was done yesterday 04/12/2019.  After he took his first meal patient became more hypoxic and tachycardic it appears that he ate too fast and too much at one time. His renal functions improved so much that no more dialysis is needed temporary dialysis catheter was taken out 04/10/2019. Patient has bilateral cheek ulcers from pressure injury from pronating while intubated when he had COVID pneumonia. The right cheek has been healed. The left cheek had bleeding profusely when ENT had to be consulted and they injected epi and lidocaine. ENT recommends biopsy if it does not heal in 1 week. However his left cheek wound is healing slowly. He is also followed by CIR when patient is medically stable he will be discharged to CIR. He is a Psychologist, sport and exercise.His whole family is in [place].  04/16/19: Patient seen and examined at bedside.  Intermittent cough on exam but states his breathing is improved.  Still requiring high level of oxygen.  Goal 35% max FiO2 for placement.  Will wean down oxygen as tolerated.  04/17/19: Patient seen and examined at bedside.  No new complaints.  Still having intermittent cough but denies dyspnea at rest.  Goal is to wean down FiO2 to max of 35%.  Was able to wean down overnight to 60%.  Assessment/Plan: Active Problems:   ARDS (adult respiratory  distress syndrome) (HCC)   Pressure injury of skin   Acute respiratory failure with hypoxia ([place])   COVID-19   AKI (acute kidney injury) ([place])   ATN (acute tubular necrosis) ([place])   Tracheostomy care ([place])   On tube feeding diet   Status post tracheostomy ([place])   History of ETT   Encounter for intubation   Acute respiratory failure with hypoxia and hypercapnia (HCC)  Acute Hypoxic Resp. Failure due to Covid 19 Viral Illness/ARDS/septic shock requiring prone ventilation and nitric oxide for ARDS related to COVID-19 -Treated at [facility] with IV steroids, IV Actemra on 4/22, -Also received convalescent plasma on 5/3 -Status post tracheostomy 5/12 -Weaned off the ventilator 5/21 -Still has moderate O2 requirement with FiO2 of35% -Chest x-ray 5/30 showed low lung volumes and patchy airspace disease which was improving -CCM following intermittently for trach care, trach was downsized to #4 uncuffed [place] on 5/26.  -Patient became hypoxic tachycardic tachypneic 6/8/ 2020 chest x-ray done shows persistent bilateral patchy pulmonary infiltrates similar or slightly worsened.   -Independently reviewed chest x-ray done on 04/14/2019 which shows slight improvement in the aeration bilaterally compared to prior chest x-ray done on 04/12/2019 which was also reviewed independently. -On trach collar at 10 L and saturation 95% -Wean down oxygen supplementation as tolerated -Goal FiO2 35% max. -Was able to wean down FiO2 to 60% of the night. -Maintain O2 saturation greater than 92%.  Resolving AKI due to ATN/Covid Creatinine today 1.05 from 1.47 on 04/15/19. -Baseline creatinine was normal 2 months ago, he started CRRT on 4/21 and transition to intermittent hemodialysis 5/28, was undergoing HD Tuesday Thursday Saturday via TDC,his renal functions have significantly improved and dialysis has been stopped at this time. Nephrology signed off 04/11/2019  Intermittent sinus tachycardia Heart rate has been ranging  between 105 and 133 Increase metoprolol tartrate dose from 12.5 mg twice daily to 25 mg twice daily Continue to closely monitor vital signs  Presumed acute PE -On 5/14 patient had a sudden decline with shock, worsening hypoxemia and echocardiogram which showed right heart strain -CT angiogram could not be obtained due to severe AKI -He was started on IV heparin for presumed PE, Dopplers were negative -3 months of anticoagulation recommended -On full dose Lovenox subcu twice daily -Lovenox restarted 04/08/2019 since he did not have any further active bleeding.  Iron deficiency anemia/anemia of chronic disease Iron studies shows iron deficiency Continue ferrous sulfate Hemoglobin stable at 7.8  Anemia due to critical illness-he had small amount of bleeding from the tracheostomy site received 3 units of blood transfusion overall during this hospital stay. He also had bleeding from the pressure ulcer on the left side of his face which has been resolved.  Dysphagia -Insetting of subacute respiratory failure, tracheostomy -SLPrecommends dysphagia 1 puree nectar thick liquid initially -Repeat swallow evaluation done on 04/12/2019 recommendations for regular solids, thin liquid.  Hyperglycemia -No clear history of diabetes, hemoglobin A1c was 5.5 -Remains on Lantus 12 units twice daily and sliding scale insulin, CBGs are less than 170  Pressure injury on the skin of nose.bilateral cheeks not present on admission Local wound care.With soap and water daily. -Appears to be healing  DVT Prophylaxis:Full dose Lovenox twice daily  PUD Prophylaxis:On famotidine/ppi Code Status:Partial code Family Communicationdiscussed with his wife in [place] call interpreter [phone] Spero Geralds if you call her she will contact you to her family and interpret for you. disposition PlanDischarge to CIR when FiO2 less  than or equal to 35%.  Pressure Injury 03/03/19 Face Right Unstageable - Full  thickness tissue loss in which the base of the ulcer is covered by slough (yellow, tan, gray, green or brown) and/or eschar (tan, brown or black) in the wound bed. 2cm x2 1/4 cm  red edges  with blac (Active)  03/03/19 0830  Location: Face  Location Orientation: Right  Staging: Unstageable - Full thickness tissue loss in which the base of the ulcer is covered by slough (yellow, tan, gray, green or brown) and/or eschar (tan, brown or black) in the wound bed.  Wound Description (Comments): 2cm x2 1/4 cm  red edges  with black cneter  Present on Admission: No     Pressure Injury 03/03/19 Face Left Unstageable - Full thickness tissue loss in which the base of the ulcer is covered by slough (yellow, tan, gray, green or brown) and/or eschar (tan, brown or black) in the wound bed. 1 1/2 cm x 2 1/2 cm red edges with y (Active)  03/03/19 0830  Location: Face  Location Orientation: Left  Staging: Unstageable - Full thickness tissue loss in which the base of the ulcer is covered by slough (yellow, tan, gray, green or brown) and/or eschar (tan, brown or black) in the wound bed.  Wound Description (Comments): 1 1/2 cm x 2 1/2 cm red edges with yellow center and black ring around yellow area  Present on Admission: No      Objective: Vitals:   04/17/19 0430 04/17/19 0743 04/17/19 0746 04/17/19 0753  BP: 130/86   127/84  Pulse: 96  (!) 106 (!) 104  Resp: (!) 35  19 (!) 30  Temp:    98.3 F (36.8 C)  TempSrc:    Oral  SpO2: 96% 100% 100% 99%  Weight:      Height:        Intake/Output Summary (Last 24 hours) at 04/17/2019 1030 Last data filed at 04/17/2019 1028 Gross per 24 hour  Intake 240 ml  Output 1875 ml  Net -1635 ml   Filed Weights   04/15/19 0600 04/16/19 0318 04/17/19 0300  Weight: 71.4 kg 72.8 kg 66.2 kg    Exam:  . General: 48 y.o. year-old male pleasant well-developed well-nourished in no acute distress.  Alert and interactive.  Trach collar in place.. . Cardiovascular:  Regular rate and rhythm with no rubs or gallops.  No JVD or thyromegaly noted.   Marland Kitchen Respiratory: Mild rales at bases with no wheezes.  Good inspiratory effort. . Abdomen: Soft nontender nondistended normal bowel sounds x4 quadrant.  Marland Kitchen Psychiatry: Mood is appropriate for condition and setting.  Data Reviewed: CBC: Recent Labs  Lab 04/11/19 0649 04/12/19 0902 04/13/19 0449 04/14/19 0546 04/15/19 0517  WBC 10.8* 8.7 10.1 10.0 9.9  HGB 7.8* 8.1* 7.5* 7.7* 7.8*  HCT 24.5* 25.6* 24.0* 24.8* 25.6*  MCV 92.8 94.8 93.8 94.7 95.2  PLT 325 338 334 357 185   Basic Metabolic Panel: Recent Labs  Lab 04/11/19 0649 04/12/19 0902 04/13/19 0449 04/14/19 0546 04/15/19 0517 04/17/19 0854  NA 137 134* 136 138 137  --   K 3.5 3.0* 3.4* 3.4* 3.7  --   CL 103 102 103 105 105  --   CO2 25 22 22 23  21*  --   GLUCOSE 114* 227* 111* 105* 107*  --   BUN 52* 42* 38* 33* 38*  --   CREATININE 2.00* 1.71* 1.54* 1.25* 1.47* 1.05  CALCIUM 10.3 10.0 9.7 9.9  9.6  --   PHOS 3.1 3.4 3.6 4.1  --   --    GFR: Estimated Creatinine Clearance: 74.8 mL/min (by C-G formula based on SCr of 1.05 mg/dL). Liver Function Tests: Recent Labs  Lab 04/11/19 0649 04/12/19 0902 04/13/19 0449 04/14/19 0546  AST 24 23 26 21   ALT 29 29 30 29   ALKPHOS 117 128* 121 120  BILITOT 0.5 0.4 0.6 0.3  PROT 6.6 6.8 6.5 6.7  ALBUMIN 2.7* 2.8* 2.6* 2.7*   No results for input(s): LIPASE, AMYLASE in the last 168 hours. No results for input(s): AMMONIA in the last 168 hours. Coagulation Profile: No results for input(s): INR, PROTIME in the last 168 hours. Cardiac Enzymes: No results for input(s): CKTOTAL, CKMB, CKMBINDEX, TROPONINI in the last 168 hours. BNP (last 3 results) No results for input(s): PROBNP in the last 8760 hours. HbA1C: No results for input(s): HGBA1C in the last 72 hours. CBG: Recent Labs  Lab 04/16/19 0800 04/16/19 1200 04/16/19 1643 04/16/19 2111 04/17/19 0753  GLUCAP 114* 145* 138* 296* 137*    Lipid Profile: No results for input(s): CHOL, HDL, LDLCALC, TRIG, CHOLHDL, LDLDIRECT in the last 72 hours. Thyroid Function Tests: No results for input(s): TSH, T4TOTAL, FREET4, T3FREE, THYROIDAB in the last 72 hours. Anemia Panel: No results for input(s): VITAMINB12, FOLATE, FERRITIN, TIBC, IRON, RETICCTPCT in the last 72 hours. Urine analysis:    Component Value Date/Time   COLORURINE AMBER (A) 02/24/2019 0054   APPEARANCEUR HAZY (A) 02/24/2019 0054   LABSPEC 1.024 02/24/2019 0054   PHURINE 5.0 02/24/2019 0054   GLUCOSEU 50 (A) 02/24/2019 0054   HGBUR MODERATE (A) 02/24/2019 0054   BILIRUBINUR NEGATIVE 02/24/2019 0054   KETONESUR 5 (A) 02/24/2019 0054   PROTEINUR 100 (A) 02/24/2019 0054   NITRITE NEGATIVE 02/24/2019 0054   LEUKOCYTESUR NEGATIVE 02/24/2019 0054   Sepsis Labs: @LABRCNTIP (procalcitonin:4,lacticidven:4)  )No results found for this or any previous visit (from the past 240 hour(s)).    Studies: No results found.  Scheduled Meds: . chlorhexidine  15 mL Mouth Rinse BID  . Chlorhexidine Gluconate Cloth  6 each Topical Q0600  . clonazepam  0.25 mg Oral Daily  . darbepoetin (ARANESP) injection - NON-DIALYSIS  100 mcg Subcutaneous Q Mon-1800  . enoxaparin (LOVENOX) injection  60 mg Subcutaneous Q12H  . feeding supplement (ENSURE ENLIVE)  237 mL Oral BID BM  . feeding supplement (PRO-STAT SUGAR FREE 64)  30 mL Oral TID  . ferrous sulfate  325 mg Oral Q breakfast  . guaiFENesin  5 mL Oral BID  . insulin aspart  0-9 Units Subcutaneous TID WC  . insulin aspart  6 Units Subcutaneous TID WC  . insulin glargine  12 Units Subcutaneous BID  . ipratropium  2 puff Inhalation Q6H  . levalbuterol  2 puff Inhalation Q6H  . mouth rinse  15 mL Mouth Rinse q12n4p  . metoprolol tartrate  12.5 mg Oral BID  . mupirocin cream   Topical BID  . pantoprazole  40 mg Oral BID  . senna-docusate  1 tablet Oral BID  . sodium chloride flush  10-40 mL Intracatheter Q12H    Continuous  Infusions: . sodium chloride Stopped (03/21/19 2128)     LOS: 59 days     Kayleen Memos, MD [facility] Pager [phone]  If 7PM-7AM, please contact night-coverage [email or web address] Password [email or web address] 04/17/2019, 10:30 AM

## 2019-04-18 LAB — GLUCOSE, CAPILLARY
Glucose-Capillary: 132 mg/dL — ABNORMAL HIGH (ref 70–99)
Glucose-Capillary: 156 mg/dL — ABNORMAL HIGH (ref 70–99)
Glucose-Capillary: 144 mg/dL — ABNORMAL HIGH (ref 70–99)
Glucose-Capillary: 226 mg/dL — ABNORMAL HIGH (ref 70–99)

## 2019-04-18 MED ORDER — IPRATROPIUM BROMIDE HFA 17 MCG/ACT IN AERS
2.0000 | INHALATION_SPRAY | Freq: Three times a day (TID) | RESPIRATORY_TRACT | Status: DC
  Administered 2019-04-19 (×2): 2 via RESPIRATORY_TRACT
  Filled 2019-04-18: qty 12.9

## 2019-04-18 MED ORDER — LEVALBUTEROL TARTRATE 45 MCG/ACT IN AERO
2.0000 | INHALATION_SPRAY | Freq: Three times a day (TID) | RESPIRATORY_TRACT | Status: DC
  Administered 2019-04-19 (×2): 2 via RESPIRATORY_TRACT
  Filled 2019-04-18: qty 15

## 2019-04-18 NOTE — Progress Notes (Signed)
PROGRESS NOTE  Jonathan [name] AVW:[id number] DOB: [birth year] DOA: 02/23/2019 PCP: Patient, No Pcp Per  HPI/Recap of past 24 hours: Brief Narrative:48 year old Spanish-speaking male from [place], was admitted on 4/21 with increased respiratory distress, he was intubated on admission for ARDS due to SARS COVID-19 infection. -Unfortunately developed severe ATN required CRRT, prolonged respiratory failure , was extubated and reintubated earlier this month, subsequently underwent tracheostomy on 5/12 , followed by placement of a permacath on 5/13 -Ventrally liberated off the ventilator 5/21 however remained anuric requiring intermittent hemodialysis subsequently transferred to [facility] 5/26 from [facility]. -Was also started on anticoagulation while in ICU for suspected pulmonary embolism -5/29 passed swallow evaluation, his core track was removed and he was started on dysphagia diet with nectar thick liquids, last HD 5/30,hopeful for renal recovery -Repeat COVID tested negative x2 on 5/29 and 5/30 Events: 02/23/2019 admitted to the ICU. 02/24/2019 started on CRRT 4/21-4/24 Proned daily 4/28 -4/30 proned daily  03/04/2019 increase in oxygen demand required paralytic for 24 hours. 03/09/2019 hematochezia from gastric aspiration 5/11 extubated 5/12 re-intubated, tracheostomy 5/13 perm cath 5/14 worsening hypoxemia, shock suddenly, RV dilated, no new CXR changes, treated for PE empirically 5/21 doing t-piece trials 5/23: Trial off of CVVHD 5/24: 2 units PRBC 5/25 remains off of ventilator support since May 21, remains anuric, 5/26 patient transferred to [facility] from [facility] as he needed intermittent hemodialysis  5/29 passed modified swallow eval 5/30 started on nectar thick liquis and taking pills by mouth,dialysis  Lines and tubes: 4/21 - ETT >>5.11.2020 4/21 - RIJ CVC, removed 4/22 L IJ HD Cath> removed 5/13 Permacath right 6/6 [facility] DCD  Antibiotics: 02/23/2019-425 2020  azithromycin and Rocephin 02/23/2019-02/24/2019 03/06/2019 Rocephin 5/11-5/17Vancomycin 5/12-5/18Zosyn 5/12-5/17Fluconazole for candida in tracheal aspirate and recent Actemra I took over his care 04/07/2019. Overnight patient had an episode of bleeding from the pressure ulcer on his face which needed epi and lidocaine by ENT. Lovenox was stopped. He is on Lovenox for presumed pulmonary embolism.  [place] taken out 04/10/2019.  Recap last 7 days-patient remains on 35% FiO2 pulmonary following at a distance. Repeat swallow evaluation modified barium swallow was done 04/12/2019 and he was started on a regular diet with thin liquids.  This was done yesterday 04/12/2019.  After he took his first meal patient became more hypoxic and tachycardic it appears that he ate too fast and too much at one time. His renal functions improved so much that no more dialysis is needed temporary dialysis catheter was taken out 04/10/2019. Patient has bilateral cheek ulcers from pressure injury from pronating while intubated when he had COVID pneumonia. The right cheek has been healed. The left cheek had bleeding profusely when ENT had to be consulted and they injected epi and lidocaine. ENT recommends biopsy if it does not heal in 1 week. However his left cheek wound is healing slowly. He is also followed by CIR when patient is medically stable he will be discharged to CIR. He is a Psychologist, sport and exercise.His whole family is in [place].  04/18/19: Patient seen and examined at his bedside.  Intermittent cough noted.  No acute events overnight.  No new concerns.  Awaiting placement.  Currently FiO2 50% with O2 saturation 97%.   Assessment/Plan: Active Problems:   ARDS (adult respiratory distress syndrome) (HCC)   Pressure injury of skin   Acute respiratory failure with hypoxia ([place])   COVID-19   AKI (acute kidney injury) ([place])   ATN (acute tubular necrosis) (HCC)   Tracheostomy care ([place])  On tube feeding diet   Status post  tracheostomy ([place])   History of ETT   Encounter for intubation   Acute respiratory failure with hypoxia and hypercapnia (HCC)  Acute Hypoxic Resp. Failure due to Covid 19 Viral Illness/ARDS/septic shock requiring prone ventilation and nitric oxide for ARDS related to COVID-19 -Treated at [facility] with IV steroids, IV Actemra on 4/22, -Also received convalescent plasma on 5/3 -Status post tracheostomy 5/12 -Weaned off the ventilator 5/21 -Still has moderate O2 requirement with FiO2 of35% -Chest x-ray 5/30 showed low lung volumes and patchy airspace disease which was improving -CCM following intermittently for trach care, trach was downsized to #4 uncuffed [place] on 5/26.  -Patient became hypoxic tachycardic tachypneic 6/8/ 2020 chest x-ray done shows persistent bilateral patchy pulmonary infiltrates similar or slightly worsened.   -Independently reviewed chest x-ray done on 04/14/2019 which shows slight improvement in the aeration bilaterally compared to prior chest x-ray done on 04/12/2019 which was also reviewed independently. -On trach collar at 10 L and saturation 95% -Wean down oxygen supplementation as tolerated -Goal FiO2 35% max. Continue to wean down FiO2 Currently on 50% and saturating 97%. Maintain O2 saturation greater than 92%  Anemia due to critical illness-he had small amount of bleeding from the tracheostomy site received 3 units of blood transfusion overall during this hospital stay. He also had bleeding from the pressure ulcer on the left side of his face which has been resolved. Repeat CBC in the morning No sign of overt bleeding at this time  Resolving AKI due to ATN/Covid Creatinine today 1.05 from 1.47 on 04/15/19. -Baseline creatinine was normal 2 months ago, he started CRRT on 4/21 and transition to intermittent hemodialysis 5/28, was undergoing HD Tuesday Thursday Saturday via TDC,his renal functions have significantly improved and dialysis has been stopped at this  time. Nephrology signed off 04/11/2019  Intermittent sinus tachycardia Heart rate has been ranging between 105 and 133 Increase metoprolol tartrate dose from 12.5 mg twice daily to 25 mg twice daily Continue to closely monitor vital signs  Presumed acute PE -On 5/14 patient had a sudden decline with shock, worsening hypoxemia and echocardiogram which showed right heart strain -CT angiogram could not be obtained due to severe AKI -He was started on IV heparin for presumed PE, Dopplers were negative -3 months of anticoagulation recommended -On full dose Lovenox subcu twice daily -Lovenox restarted 04/08/2019 since he did not have any further active bleeding.  Iron deficiency anemia/anemia of chronic disease Iron studies shows iron deficiency Continue ferrous sulfate Hemoglobin stable at 7.8  Dysphagia -Insetting of subacute respiratory failure, tracheostomy -SLPrecommends dysphagia 1 puree nectar thick liquid initially -Repeat swallow evaluation done on 04/12/2019 recommendations for regular solids, thin liquid.  Hyperglycemia -No clear history of diabetes, hemoglobin A1c was 5.5 -Remains on Lantus 12 units twice daily and sliding scale insulin, CBGs are less than 170  Pressure injury on the skin of nose.bilateral cheeks not present on admission Local wound care.With soap and water daily. -Appears to be healing  DVT Prophylaxis:Full dose Lovenox twice daily  PUD Prophylaxis:On famotidine/ppi Code Status:Partial code Family Communicationdiscussed with his wife in [place] call interpreter [phone] Spero Geralds if you call her she will contact you to her family and interpret for you. disposition PlanDischarge to CIR when FiO2 less than or equal to 35%.  Pressure Injury 03/03/19 Face Right Unstageable - Full thickness tissue loss in which the base of the ulcer is covered by slough (yellow, tan, gray, green or brown) and/or eschar (  tan, brown or black) in the wound bed. 2cm  x2 1/4 cm  red edges  with blac (Active)  03/03/19 0830  Location: Face  Location Orientation: Right  Staging: Unstageable - Full thickness tissue loss in which the base of the ulcer is covered by slough (yellow, tan, gray, green or brown) and/or eschar (tan, brown or black) in the wound bed.  Wound Description (Comments): 2cm x2 1/4 cm  red edges  with black cneter  Present on Admission: No     Pressure Injury 03/03/19 Face Left Unstageable - Full thickness tissue loss in which the base of the ulcer is covered by slough (yellow, tan, gray, green or brown) and/or eschar (tan, brown or black) in the wound bed. 1 1/2 cm x 2 1/2 cm red edges with y (Active)  03/03/19 0830  Location: Face  Location Orientation: Left  Staging: Unstageable - Full thickness tissue loss in which the base of the ulcer is covered by slough (yellow, tan, gray, green or brown) and/or eschar (tan, brown or black) in the wound bed.  Wound Description (Comments): 1 1/2 cm x 2 1/2 cm red edges with yellow center and black ring around yellow area  Present on Admission: No      Objective: Vitals:   04/18/19 0732 04/18/19 0736 04/18/19 0739 04/18/19 1131  BP: 123/84 123/84  (!) 120/93  Pulse: 81 86  (!) 111  Resp: (!) 27 (!) 25  (!) 38  Temp: 98.5 F (36.9 C)   98.1 F (36.7 C)  TempSrc: Oral   Oral  SpO2: 100% 97% 97% 96%  Weight:      Height:        Intake/Output Summary (Last 24 hours) at 04/18/2019 1153 Last data filed at 04/18/2019 0600 Gross per 24 hour  Intake 720 ml  Output 2650 ml  Net -1930 ml   Filed Weights   04/16/19 0318 04/17/19 0300 04/18/19 0500  Weight: 72.8 kg 66.2 kg 66.4 kg    Exam:   General: 48 y.o. year-old male pleasant well-developed in no acute distress.  Alert and interactive.  Trach collar in place.    Cardiovascular: Regular rate and rhythm with no rubs or gallops.  No JVD or thyromegaly noted.  Respiratory: Clear to auscultation with no wheezes noted.  Poor inspiratory  effort.  Abdomen: Soft and nontender on palpation with normal bowel sounds x4.   Psychiatry: His mood is appropriate for condition and setting.  Data Reviewed: CBC: Recent Labs  Lab 04/12/19 0902 04/13/19 0449 04/14/19 0546 04/15/19 0517  WBC 8.7 10.1 10.0 9.9  HGB 8.1* 7.5* 7.7* 7.8*  HCT 25.6* 24.0* 24.8* 25.6*  MCV 94.8 93.8 94.7 95.2  PLT 338 334 357 622   Basic Metabolic Panel: Recent Labs  Lab 04/12/19 0902 04/13/19 0449 04/14/19 0546 04/15/19 0517 04/17/19 0854  NA 134* 136 138 137  --   K 3.0* 3.4* 3.4* 3.7  --   CL 102 103 105 105  --   CO2 22 22 23  21*  --   GLUCOSE 227* 111* 105* 107*  --   BUN 42* 38* 33* 38*  --   CREATININE 1.71* 1.54* 1.25* 1.47* 1.05  CALCIUM 10.0 9.7 9.9 9.6  --   PHOS 3.4 3.6 4.1  --   --    GFR: Estimated Creatinine Clearance: 74.8 mL/min (by C-G formula based on SCr of 1.05 mg/dL). Liver Function Tests: Recent Labs  Lab 04/12/19 0902 04/13/19 0449 04/14/19 0546  AST  23 26 21   ALT 29 30 29   ALKPHOS 128* 121 120  BILITOT 0.4 0.6 0.3  PROT 6.8 6.5 6.7  ALBUMIN 2.8* 2.6* 2.7*   No results for input(s): LIPASE, AMYLASE in the last 168 hours. No results for input(s): AMMONIA in the last 168 hours. Coagulation Profile: No results for input(s): INR, PROTIME in the last 168 hours. Cardiac Enzymes: No results for input(s): CKTOTAL, CKMB, CKMBINDEX, TROPONINI in the last 168 hours. BNP (last 3 results) No results for input(s): PROBNP in the last 8760 hours. HbA1C: No results for input(s): HGBA1C in the last 72 hours. CBG: Recent Labs  Lab 04/17/19 1136 04/17/19 1702 04/17/19 2107 04/18/19 0730 04/18/19 1134  GLUCAP 192* 145* 124* 132* 156*   Lipid Profile: No results for input(s): CHOL, HDL, LDLCALC, TRIG, CHOLHDL, LDLDIRECT in the last 72 hours. Thyroid Function Tests: No results for input(s): TSH, T4TOTAL, FREET4, T3FREE, THYROIDAB in the last 72 hours. Anemia Panel: No results for input(s): VITAMINB12, FOLATE,  FERRITIN, TIBC, IRON, RETICCTPCT in the last 72 hours. Urine analysis:    Component Value Date/Time   COLORURINE AMBER (A) 02/24/2019 0054   APPEARANCEUR HAZY (A) 02/24/2019 0054   LABSPEC 1.024 02/24/2019 0054   PHURINE 5.0 02/24/2019 0054   GLUCOSEU 50 (A) 02/24/2019 0054   HGBUR MODERATE (A) 02/24/2019 0054   BILIRUBINUR NEGATIVE 02/24/2019 0054   KETONESUR 5 (A) 02/24/2019 0054   PROTEINUR 100 (A) 02/24/2019 0054   NITRITE NEGATIVE 02/24/2019 0054   LEUKOCYTESUR NEGATIVE 02/24/2019 0054   Sepsis Labs: @LABRCNTIP (procalcitonin:4,lacticidven:4)  )No results found for this or any previous visit (from the past 240 hour(s)).    Studies: No results found.  Scheduled Meds:  chlorhexidine  15 mL Mouth Rinse BID   Chlorhexidine Gluconate Cloth  6 each Topical Q0600   clonazepam  0.25 mg Oral Daily   darbepoetin (ARANESP) injection - NON-DIALYSIS  100 mcg Subcutaneous Q Mon-1800   enoxaparin (LOVENOX) injection  60 mg Subcutaneous Q12H   feeding supplement (ENSURE ENLIVE)  237 mL Oral BID BM   feeding supplement (PRO-STAT SUGAR FREE 64)  30 mL Oral TID   ferrous sulfate  325 mg Oral Q breakfast   guaiFENesin  5 mL Oral BID   insulin aspart  0-9 Units Subcutaneous TID WC   insulin aspart  6 Units Subcutaneous TID WC   insulin glargine  12 Units Subcutaneous BID   ipratropium  2 puff Inhalation Q6H   levalbuterol  2 puff Inhalation Q6H   mouth rinse  15 mL Mouth Rinse q12n4p   metoprolol tartrate  25 mg Oral BID   mupirocin cream   Topical BID   pantoprazole  40 mg Oral BID   senna-docusate  1 tablet Oral BID   sodium chloride flush  10-40 mL Intracatheter Q12H    Continuous Infusions:  sodium chloride Stopped (03/21/19 2128)     LOS: 54 days     Kayleen Memos, MD [facility] Pager [phone]  If 7PM-7AM, please contact night-coverage [email or web address] Password [facility] 04/18/2019, 11:53 AM

## 2019-04-18 NOTE — Progress Notes (Signed)
Assisted tele visit to patient with family member.  Keyler Hoge M, RN  

## 2019-04-19 ENCOUNTER — Other Ambulatory Visit: Payer: Self-pay

## 2019-04-19 ENCOUNTER — Inpatient Hospital Stay (HOSPITAL_COMMUNITY)
Admission: RE | Admit: 2019-04-19 | Discharge: 2019-04-30 | DRG: 945 | Disposition: A | Discharge status: Home / Self Care | Payer: Self-pay | Source: Intra-hospital | Attending: Physical Medicine & Rehabilitation | Admitting: Physical Medicine & Rehabilitation

## 2019-04-19 ENCOUNTER — Encounter (HOSPITAL_COMMUNITY): Payer: Self-pay

## 2019-04-19 DIAGNOSIS — N179 Acute kidney failure, unspecified: Secondary | ICD-10-CM | POA: Diagnosis present

## 2019-04-19 DIAGNOSIS — U071 COVID-19: Secondary | ICD-10-CM

## 2019-04-19 DIAGNOSIS — R000 Tachycardia, unspecified: Secondary | ICD-10-CM | POA: Diagnosis not present

## 2019-04-19 DIAGNOSIS — I2699 Other pulmonary embolism without acute cor pulmonale: Secondary | ICD-10-CM

## 2019-04-19 DIAGNOSIS — Z8616 Personal history of COVID-19: Secondary | ICD-10-CM

## 2019-04-19 DIAGNOSIS — I10 Essential (primary) hypertension: Secondary | ICD-10-CM | POA: Diagnosis present

## 2019-04-19 DIAGNOSIS — Z8619 Personal history of other infectious and parasitic diseases: Secondary | ICD-10-CM

## 2019-04-19 DIAGNOSIS — R5381 Other malaise: Principal | ICD-10-CM | POA: Diagnosis present

## 2019-04-19 DIAGNOSIS — R7309 Other abnormal glucose: Secondary | ICD-10-CM

## 2019-04-19 DIAGNOSIS — Z930 Tracheostomy status: Secondary | ICD-10-CM

## 2019-04-19 DIAGNOSIS — E119 Type 2 diabetes mellitus without complications: Secondary | ICD-10-CM | POA: Diagnosis present

## 2019-04-19 DIAGNOSIS — Z86711 Personal history of pulmonary embolism: Secondary | ICD-10-CM

## 2019-04-19 DIAGNOSIS — D62 Acute posthemorrhagic anemia: Secondary | ICD-10-CM | POA: Diagnosis present

## 2019-04-19 LAB — CBC
HCT: 28.4 % — ABNORMAL LOW (ref 39.0–52.0)
Hemoglobin: 8.8 g/dL — ABNORMAL LOW (ref 13.0–17.0)
MCH: 29.4 pg (ref 26.0–34.0)
MCHC: 31 g/dL (ref 30.0–36.0)
MCV: 95 fL (ref 80.0–100.0)
Platelets: 429 10*3/uL — ABNORMAL HIGH (ref 150–400)
RBC: 2.99 MIL/uL — ABNORMAL LOW (ref 4.22–5.81)
RDW: 14.8 % (ref 11.5–15.5)
WBC: 10 10*3/uL (ref 4.0–10.5)
nRBC: 0 % (ref 0.0–0.2)

## 2019-04-19 LAB — GLUCOSE, CAPILLARY
Glucose-Capillary: 150 mg/dL — ABNORMAL HIGH (ref 70–99)
Glucose-Capillary: 181 mg/dL — ABNORMAL HIGH (ref 70–99)
Glucose-Capillary: 140 mg/dL — ABNORMAL HIGH (ref 70–99)
Glucose-Capillary: 206 mg/dL — ABNORMAL HIGH (ref 70–99)

## 2019-04-19 MED ORDER — IPRATROPIUM BROMIDE HFA 17 MCG/ACT IN AERS: 2.0000 | INHALATION_SPRAY | Freq: Three times a day (TID) | RESPIRATORY_TRACT | 0 refills | Status: DC

## 2019-04-19 MED ORDER — SENNOSIDES-DOCUSATE SODIUM 8.6-50 MG PO TABS
1.0000 | ORAL_TABLET | Freq: Two times a day (BID) | ORAL | Status: DC
  Administered 2019-04-19 – 2019-04-30 (×21): 1 via ORAL
  Filled 2019-04-19 (×22): qty 1

## 2019-04-19 MED ORDER — OXYCODONE HCL 5 MG PO TABS
5.0000 mg | ORAL_TABLET | Freq: Two times a day (BID) | ORAL | Status: DC | PRN
  Administered 2019-04-24: 5 mg via ORAL
  Filled 2019-04-19: qty 1

## 2019-04-19 MED ORDER — PANTOPRAZOLE SODIUM 40 MG PO TBEC
40.0000 mg | DELAYED_RELEASE_TABLET | Freq: Two times a day (BID) | ORAL | Status: DC
  Administered 2019-04-19 – 2019-04-30 (×22): 40 mg via ORAL
  Filled 2019-04-19 (×22): qty 1

## 2019-04-19 MED ORDER — PRO-STAT SUGAR FREE PO LIQD
30.0000 mL | Freq: Three times a day (TID) | ORAL | Status: DC
  Administered 2019-04-19 – 2019-04-30 (×32): 30 mL via ORAL
  Filled 2019-04-19 (×32): qty 30

## 2019-04-19 MED ORDER — INSULIN ASPART 100 UNIT/ML ~~LOC~~ SOLN
0.0000 [IU] | Freq: Three times a day (TID) | SUBCUTANEOUS | Status: DC
  Administered 2019-04-19: 1 [IU] via SUBCUTANEOUS
  Administered 2019-04-20 (×2): 2 [IU] via SUBCUTANEOUS
  Administered 2019-04-20: 07:00:00 1 [IU] via SUBCUTANEOUS
  Administered 2019-04-21 – 2019-04-22 (×2): 2 [IU] via SUBCUTANEOUS
  Administered 2019-04-22 – 2019-04-23 (×2): 1 [IU] via SUBCUTANEOUS
  Administered 2019-04-23: 18:00:00 2 [IU] via SUBCUTANEOUS
  Administered 2019-04-23: 1 [IU] via SUBCUTANEOUS
  Administered 2019-04-24 – 2019-04-25 (×2): 2 [IU] via SUBCUTANEOUS
  Administered 2019-04-27: 5 [IU] via SUBCUTANEOUS
  Administered 2019-04-28 – 2019-04-29 (×2): 1 [IU] via SUBCUTANEOUS
  Administered 2019-04-30: 2 [IU] via SUBCUTANEOUS

## 2019-04-19 MED ORDER — METOPROLOL TARTRATE 25 MG PO TABS: 25.0000 mg | ORAL_TABLET | Freq: Two times a day (BID) | ORAL | 0 refills | Status: DC

## 2019-04-19 MED ORDER — INSULIN GLARGINE 100 UNIT/ML ~~LOC~~ SOLN
12.0000 [IU] | Freq: Two times a day (BID) | SUBCUTANEOUS | Status: DC
  Administered 2019-04-19 – 2019-04-29 (×21): 12 [IU] via SUBCUTANEOUS
  Filled 2019-04-19 (×24): qty 0.12

## 2019-04-19 MED ORDER — INSULIN ASPART 100 UNIT/ML ~~LOC~~ SOLN
6.0000 [IU] | Freq: Three times a day (TID) | SUBCUTANEOUS | Status: DC
  Administered 2019-04-19 – 2019-04-24 (×14): 6 [IU] via SUBCUTANEOUS

## 2019-04-19 MED ORDER — ACETAMINOPHEN 325 MG PO TABS: 650.0000 mg | ORAL_TABLET | Freq: Four times a day (QID) | ORAL | Status: DC | PRN

## 2019-04-19 MED ORDER — CLONAZEPAM 0.25 MG PO TBDP
0.2500 mg | ORAL_TABLET | Freq: Every day | ORAL | Status: DC
  Administered 2019-04-20 – 2019-04-30 (×11): 0.25 mg via ORAL
  Filled 2019-04-19 (×12): qty 1

## 2019-04-19 MED ORDER — LEVALBUTEROL TARTRATE 45 MCG/ACT IN AERO: 2.0000 | INHALATION_SPRAY | Freq: Four times a day (QID) | RESPIRATORY_TRACT | Status: DC | PRN

## 2019-04-19 MED ORDER — ENSURE ENLIVE PO LIQD
237.0000 mL | Freq: Two times a day (BID) | ORAL | Status: DC
  Administered 2019-04-20 – 2019-04-30 (×19): 237 mL via ORAL

## 2019-04-19 MED ORDER — LEVALBUTEROL TARTRATE 45 MCG/ACT IN AERO: 2.0000 | INHALATION_SPRAY | Freq: Three times a day (TID) | RESPIRATORY_TRACT | 0 refills | Status: DC

## 2019-04-19 MED ORDER — MUPIROCIN CALCIUM 2 % EX CREA
TOPICAL_CREAM | Freq: Two times a day (BID) | CUTANEOUS | Status: DC
  Administered 2019-04-20 – 2019-04-21 (×3): via TOPICAL
  Administered 2019-04-21: 1 via TOPICAL
  Administered 2019-04-22 – 2019-04-30 (×17): via TOPICAL
  Filled 2019-04-19 (×2): qty 15

## 2019-04-19 MED ORDER — IPRATROPIUM BROMIDE HFA 17 MCG/ACT IN AERS
2.0000 | INHALATION_SPRAY | Freq: Three times a day (TID) | RESPIRATORY_TRACT | Status: DC
  Administered 2019-04-19 – 2019-04-21 (×5): 2 via RESPIRATORY_TRACT
  Filled 2019-04-19: qty 12.9

## 2019-04-19 MED ORDER — ENOXAPARIN SODIUM 60 MG/0.6ML ~~LOC~~ SOLN: 60.0000 mg | Freq: Two times a day (BID) | SUBCUTANEOUS | 0 refills | Status: DC

## 2019-04-19 MED ORDER — GUAIFENESIN 100 MG/5ML PO SOLN
5.0000 mL | Freq: Two times a day (BID) | ORAL | Status: DC
  Administered 2019-04-19 – 2019-04-30 (×22): 100 mg via ORAL
  Filled 2019-04-19 (×22): qty 10

## 2019-04-19 MED ORDER — ENOXAPARIN SODIUM 60 MG/0.6ML ~~LOC~~ SOLN
60.0000 mg | Freq: Two times a day (BID) | SUBCUTANEOUS | Status: DC
  Administered 2019-04-19 – 2019-04-21 (×5): 60 mg via SUBCUTANEOUS
  Filled 2019-04-19 (×7): qty 0.6

## 2019-04-19 MED ORDER — METOPROLOL TARTRATE 25 MG PO TABS
25.0000 mg | ORAL_TABLET | Freq: Two times a day (BID) | ORAL | Status: DC
  Administered 2019-04-19 – 2019-04-30 (×22): 25 mg via ORAL
  Filled 2019-04-19 (×22): qty 1

## 2019-04-19 MED ORDER — CLONAZEPAM 0.25 MG PO TBDP: 0.2500 mg | ORAL_TABLET | Freq: Every day | ORAL | 0 refills | Status: DC

## 2019-04-19 MED ORDER — INSULIN GLARGINE 100 UNIT/ML ~~LOC~~ SOLN: 12.0000 [IU] | Freq: Two times a day (BID) | SUBCUTANEOUS | 0 refills | Status: DC

## 2019-04-19 MED ORDER — LEVALBUTEROL TARTRATE 45 MCG/ACT IN AERO
2.0000 | INHALATION_SPRAY | Freq: Three times a day (TID) | RESPIRATORY_TRACT | Status: DC
  Administered 2019-04-19 – 2019-04-21 (×5): 2 via RESPIRATORY_TRACT
  Filled 2019-04-19: qty 15

## 2019-04-19 MED ORDER — LIP MEDEX EX OINT
TOPICAL_OINTMENT | CUTANEOUS | Status: DC | PRN
  Filled 2019-04-19: qty 7

## 2019-04-19 MED ORDER — FERROUS SULFATE 325 (65 FE) MG PO TABS
325.0000 mg | ORAL_TABLET | Freq: Every day | ORAL | Status: DC
  Administered 2019-04-20 – 2019-04-30 (×11): 325 mg via ORAL
  Filled 2019-04-19 (×11): qty 1

## 2019-04-19 MED ORDER — ENOXAPARIN SODIUM 60 MG/0.6ML ~~LOC~~ SOLN: 60.0000 mg | Freq: Two times a day (BID) | SUBCUTANEOUS | Status: DC

## 2019-04-19 MED ORDER — INSULIN ASPART 100 UNIT/ML ~~LOC~~ SOLN: 6.0000 [IU] | Freq: Three times a day (TID) | SUBCUTANEOUS | 0 refills | Status: DC

## 2019-04-19 MED ORDER — PANTOPRAZOLE SODIUM 40 MG PO TBEC: 40.0000 mg | DELAYED_RELEASE_TABLET | Freq: Two times a day (BID) | ORAL | 0 refills | Status: DC

## 2019-04-19 MED ORDER — FERROUS SULFATE 325 (65 FE) MG PO TABS: 325.0000 mg | ORAL_TABLET | Freq: Every day | ORAL | 0 refills | Status: DC

## 2019-04-19 MED ORDER — ENSURE ENLIVE PO LIQD: 237.0000 mL | Freq: Two times a day (BID) | ORAL | 0 refills | Status: DC

## 2019-04-19 NOTE — Evaluation (Signed)
Occupational Therapy Assessment and Plan  Patient Details  Name: Jonathan [name] MRN: [id number] Date of Birth: [birth year]  OT Diagnosis: muscle weakness (generalized) and decreased oxygen support, decreased activity tolerance Rehab Potential: Rehab Potential (ACUTE ONLY): Excellent ELOS: 12-14 days   Today's Date: 04/20/2019 OT Individual Time: 3729-0211 OT Individual Time Calculation (min): 70 min     Problem List:  Patient Active Problem List   Diagnosis Date Noted  . Acute blood loss anemia   . Tracheostomy status ([place])   . Tachycardia   . Diabetes mellitus type 2 in nonobese (HCC)   . Acute pulmonary embolism ([place])   . Debility 04/19/2019  . Acute respiratory failure with hypoxia and hypercapnia (HCC)   . Encounter for intubation   . History of ETT   . Status post tracheostomy ([place])   . Tracheostomy care ([place])   . On tube feeding diet   . ATN (acute tubular necrosis) ([place]) 03/12/2019  . AKI (acute kidney injury) ([place])   . Acute respiratory failure with hypoxia ([place])   . COVID-19   . Pressure injury of skin 02/28/2019  . ARDS (adult respiratory distress syndrome) ([place]) 02/23/2019    Past Medical History: History reviewed. No pertinent past medical history. Past Surgical History:  Past Surgical History:  Procedure Laterality Date  . INSERTION OF DIALYSIS CATHETER N/A 03/17/2019   Procedure: INSERTION OF DIALYSIS CATHETER RIGHT INTERNAL JUGULAR vEIN;  Surgeon: Elam Dutch, MD;  Location: [facility] OR;  Service: Vascular;  Laterality: N/A;    Assessment & Plan Clinical Impression: Patient is a 48 y.o. 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  Presented 02/23/2019 with increasing shortness of breath requiring intubation for airway protection follow-up critical care medicine for severe respiratory failure requiring ventilatory support related to SIRS COVID-19 infection.  Patient developed severe ATN requiring CRRT with prolonged respiratory  failure extubated later underwent tracheostomy 03/16/2019 per Dr. Nelda Marseille as well as placement of permacath 03/17/2019 for renal failure requiring intermittent dialysis.  Renal ultrasound showed no hydronephrosis.  On 03/18/2019 patient with worsening hypoxemia and placed on intravenous heparin for suspect pulmonary emboli.  CT angiogram cannot be obtained at that time due to severe AKI.  Transitioned to Eliquis but was discontinued due to drop in hemoglobin required 2 units packed red blood cells and currently remains only on Lovenox with recommendations to continue for 3 months.  Lower extremity Dopplers were negative for DVT. He was transferred 03/30/2019 from [facility] to [facility].  Repeat COVID testing negative x2 on 04/02/2019 and 04/03/2019 patient's. Diet has been advanced to regular after initially receiving nasogastric tube feeds  Currently with a #4 cuffless trach PMV as directed plan for capping per pulmonary services.  During patient's hospital course developed full-thickness related pressure injuries to bilateral cheeks while recovering from [place].  Wound care nurse follow-up for skin care.  Patient's renal function is stabilized latest creatinine 1.47 and no longer requiring dialysis. Patient transferred to CIR on 04/19/2019 .    Patient currently requires min with basic self-care skills secondary to muscle weakness, decreased cardiorespiratoy endurance and decreased oxygen support and decreased standing balance, decreased postural control and decreased balance strategies.  Prior to hospitalization, patient could complete BADL with min.  Patient will benefit from skilled intervention to increase independence with basic self-care skills prior to discharge home independently.  Anticipate patient will require intermittent supervision and follow up home health.  OT - End of Session Endurance  Deficit: Yes Endurance Deficit Description: requires up to 6 L/min O2 to maintain spO2 in 90s  during BADL tasks and multiple rest breaks OT Assessment Rehab Potential (ACUTE ONLY): Excellent OT Patient demonstrates impairments in the following area(s): Balance;Endurance;Motor OT Basic ADL's Functional Problem(s): Grooming;Eating;Bathing;Dressing;Toileting OT Advanced ADL's Functional Problem(s): Simple Meal Preparation OT Transfers Functional Problem(s): Toilet;Tub/Shower OT Additional Impairment(s): None OT Plan OT Intensity: Minimum of 1-2 x/day, 45 to 90 minutes OT Frequency: 5 out of 7 days OT Duration/Estimated Length of Stay: 12-14 days OT Treatment/Interventions: Medical illustrator training;Community reintegration;Discharge planning;Disease mangement/prevention;DME/adaptive equipment instruction;Functional mobility training;Neuromuscular re-education;Patient/family education;Psychosocial support;Self Care/advanced ADL retraining;Therapeutic Activities;Therapeutic Exercise;UE/LE Strength taining/ROM;UE/LE Coordination activities OT Self Feeding Anticipated Outcome(s): Independent  OT Basic Self-Care Anticipated Outcome(s): Mod I OT Toileting Anticipated Outcome(s): Mod I OT Bathroom Transfers Anticipated Outcome(s): Mod I OT Recommendation Recommendations for Other Services: Therapeutic Recreation consult(active and independent PTA- may be good to facetime family) Patient destination: Home Follow Up Recommendations: Home health OT Equipment Recommended: To be determined  Skilled Therapeutic Intervention  Pt greeted semi-reclined in bed with interpreter present and agreeable to OT evaluation and treatment session. OT eval completed addressing rehab process, OT purpose, POC, ELOS, and goals. Pt came to sitting EOB with supervision and increased time. OT placed water proof dressing over old trach site and R hand where IV was. Pt then ambulated into bathroom with CGA. Pt desaturated ot 79% on 4L, requiring 6L and deep breathing techniques to recover to 91%. Pt maintained on 6L of  O2 for remainder of session. Bathing completed with min A to wash feet and multiple rest breaks 2/2 fatigue. Dressing completed seated EOB with min A and rest breaks after sit<>stand. Pt completed grooming tasks standing at the sink for 2 mins. SpO2 dropped to 87% and needed seated rest break and deep breathing to recover. Pt requests to return to bed at end of session and left semi-reclined in bed with bed alarm on and needs met.    OT Evaluation Precautions/Restrictions  Precautions Precautions: Other (comment) Precaution Comments: trach recently decannulated, watch O2 and HR Restrictions Weight Bearing Restrictions: No Vital Signs Therapy Vitals Temp: 98.3 F (36.8 C) Temp Source: Oral Pulse Rate: (!) 103 Resp: (!) 22 BP: (!) 132/91 Patient Position (if appropriate): Sitting Oxygen Therapy SpO2: 97 % O2 Device: Nasal Cannula O2 Flow Rate (L/min): 4 L/min Patient Activity (if Appropriate): In chair Pulse Oximetry Type: Intermittent Pain  denies pain Home Living/Prior Functioning Home Living Family/patient expects to be discharged to:: Unsure Living Arrangements: Other (Comment)(employer provided housing) Available Help at Discharge: Other (Comment)(roommates able to help PRN) Type of Home: House Home Access: Stairs to enter [employer] of Steps: 5-6 steps Home Layout: One level Bathroom Shower/Tub: Tub/shower unit, Industrial/product designer: Yes Additional Comments: From [place]; works on farm through [place] With: Other (Comment)(Roomate on farm) IADL History Education: finished middle school Prior Function Level of Independence: Independent with basic ADLs, Independent with transfers, Independent with homemaking with ambulation, Independent with gait  Able to Take Stairs?: Yes Vocation: Full time employment Vocation Requirements: in [place] on working [employer], works at [employer] in [place], arrived from [place] in April  2020, 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 prior to going back to [place], will not be required to work on farm ADL ADL Eating: Set up Grooming: Contact guard Upper Body Bathing: Contact guard Lower Body Bathing: Minimal assistance Upper Body Dressing: Contact guard Lower Body Dressing: Minimal assistance Toileting: Contact guard  Toilet Transfer: Restaurant manager, fast food Baseline Vision/History: No visual deficits Patient Visual Report: No change from baseline Perception  Perception: Within Functional Limits Praxis Praxis: Intact Cognition Overall Cognitive Status: Within Functional Limits for tasks assessed Arousal/Alertness: Awake/alert Orientation Level: Person;Place;Situation Person: Oriented Place: Oriented Situation: Oriented Year: 2020 Month: June Day of Week: Correct Memory: Appears intact Immediate Memory Recall: Sock;Blue;Bed Memory Recall: Sock;Blue;Bed Memory Recall Sock: Without Cue Memory Recall Blue: Without Cue Memory Recall Bed: Without Cue Awareness: Appears intact Problem Solving: Appears intact Safety/Judgment: Appears intact Sensation Sensation Light Touch: Appears Intact(reports some tingling in LEs at night time, unsure of etiology and unable to explain further) Coordination Gross Motor Movements are Fluid and Coordinated: No(mild ataxia) Fine Motor Movements are Fluid and Coordinated: No Coordination and Movement Description: slight decreased smoothness and accuracy 2/2 generalized weakness Heel Shin Test: WNL Motor  Motor Motor: Ataxia Motor - Skilled Clinical Observations: generalized weakness, mild ataxia w/ LE open chain movements Mobility  Bed Mobility Bed Mobility: Rolling Right;Rolling Left;Sit to Supine;Supine to Sit Rolling Right: Supervision/verbal cueing Rolling Left: Supervision/Verbal cueing Supine to Sit: Supervision/Verbal cueing Sit to Supine: Supervision/Verbal  cueing Transfers Sit to Stand: Contact Guard/Touching assist Stand to Sit: Contact Guard/Touching assist  Trunk/Postural Assessment  Cervical Assessment Cervical Assessment: Within Functional Limits Thoracic Assessment Thoracic Assessment: Within Functional Limits Lumbar Assessment Lumbar Assessment: Within Functional Limits Postural Control Postural Control: Within Functional Limits  Balance Balance Balance Assessed: Yes Static Sitting Balance Static Sitting - Balance Support: No upper extremity supported;Feet supported Static Sitting - Level of Assistance: 5: Stand by assistance Dynamic Sitting Balance Dynamic Sitting - Balance Support: No upper extremity supported;Feet supported Dynamic Sitting - Level of Assistance: 5: Stand by assistance Static Standing Balance Static Standing - Balance Support: No upper extremity supported;During functional activity Static Standing - Level of Assistance: 4: Min assist Dynamic Standing Balance Dynamic Standing - Balance Support: No upper extremity supported;During functional activity Dynamic Standing - Level of Assistance: 4: Min assist Extremity/Trunk Assessment RUE Assessment RUE Assessment: Exceptions to [facility] General Strength Comments: Grossly 4-/5 RUE Strength Right Shoulder Flexion: 4-/5 LUE Assessment General Strength Comments: Grossly 4-/5     Refer to Care Plan for Long Term Goals  Recommendations for other services: Therapeutic Recreation  Other Pt active and independent PTA, may be a good candidate to call family in [place] on facetime   Discharge Criteria: Patient will be discharged from OT if patient refuses treatment 3 consecutive times without medical reason, if treatment goals not met, if there is a change in medical status, if patient makes no progress towards goals or if patient is discharged from hospital.  The above assessment, treatment plan, treatment alternatives and goals were discussed and mutually agreed upon:  by patient  Valma Cava 04/20/2019, 4:15 PM

## 2019-04-19 NOTE — Progress Notes (Signed)
Inpatient Rehabilitation Admissions Coordinator  I met with patient at bedside with [employer] interpreter [facility] # [phone]. I discussed an inpt rehab admit today and he is in agreement. He has multiple questions concerning his [employer] compensation and expenses. I will contact Union rep to request she follow up with patient. I contacted Dr. Nevada Crane, RN, Justice Rocher as well as RN CM Neoma Laming. I will make the arrangements to admit today. I did make patient aware that PCCM plans to cap his trach today.  Danne Baxter, RN, MSN Rehab Admissions Coordinator [phone] 04/19/2019 11:32 AM

## 2019-04-19 NOTE — Discharge Instructions (Signed)
Síndrome de dificultad respiratoria aguda en los adultos °Acute Respiratory Distress Syndrome, Adult ° °El síndrome de dificultad respiratoria aguda es una afección potencialmente mortal en la que se acumula líquido en los pulmones. Esto evita que los pulmones se llenen con aire y pasen oxígeno a la sangre. También puede hacer que los pulmones y otros órganos vitales funcionen mal. Por lo general, la afección aparece después de una infección, enfermedad, cirugía o lesión. °¿Cuáles son las causas? °Esta afección puede ser causada por lo siguiente: °· Una infección, como sepsis o neumonía. °· Una lesión grave en la cabeza o en el pecho. °· Sangrado intenso de una herida. °· Una cirugía mayor. °· La inhalación de humo o sustancias químicas perjudiciales. °· Transfusiones de sangre. °· Un coágulo de sangre en los pulmones. °· Inhalar vómito (aspiración). °· Cuasiahogamiento. °· Inflamación del páncreas (pancreatitis). °· Una sobredosis de drogas. °¿Cuáles son los signos o los síntomas? °Los síntomas principales de esta afección son falta repentina de aire y respiración acelerada. Otros síntomas pueden incluir lo siguiente: °· Latidos cardíacos rápidos o irregulares. °· Piel, labios o yemas de los dedos de color azulado (cianosis). °· Confusión. °· Cansancio o pérdida de la energía. °· Dolor de pecho, especialmente al respirar. °· Tos. °· Ansiedad o inquietud. °· Fiebre. Por lo general, esto está presente si hay una infección subyacente, como neumonía. °¿Cómo se diagnostica? °Esta afección se diagnostica en función de lo siguiente: °· Sus síntomas. °· Antecedentes médicos. °· Un examen físico. Durante el examen, el médico escuchará el corazón y tratará de detectar sonidos crepitantes o sibilancias en los pulmones. °También se pueden realizar otros exámenes para confirmar el diagnóstico y medir cómo están funcionando los pulmones. Estos pueden incluir lo siguiente: °· Medición de la cantidad de oxígeno en la sangre. El  médico usará dos métodos para realizar este procedimiento: °? Un aparato pequeño (pulsioxímetro) que se coloca en el dedo, en el lóbulo o en el pie. °? Estudio de gases en sangre arterial. Se toma una muestra de sangre de una arteria y se examinan los niveles de oxígeno. °· Análisis de sangre. °· Radiografía de pecho o exploración por tomografía computarizada (TC) para controlar el líquido en los pulmones. °· Se toma una muestra del esputo para examinar la infección. °· Estudios del corazón, como un electrocardiograma o un ecocardiograma. Esto se realiza para descartar cualquier problema cardíaco (como insuficiencia cardíaca) que pudiera estar causando los síntomas. °· Broncoscopia. En este estudio, se introduce un tubo delgado y flexible por la boca o la nariz, que atraviesa la tráquea y llega a los pulmones. °¿Cómo se trata? °El tratamiento depende de la causa de la afección. El objetivo es dar soporte mientras los pulmones se curan y se trata la causa subyacente. El tratamiento puede incluir lo siguiente: °· Oxigenoterapia. Esto puede lograrse a través de: °? Un tubo en la nariz o una máscara facial. °? Respirador. Este dispositivo ayuda a que se mueva el aire dentro y fuera de los pulmones mediante un tubo de respiración insertado en la boca o en la nariz. °· Presión positiva de las vías aéreas continua (PPC). Este tratamiento utiliza una presión de aire leve para mantener las vías respiratorias abiertas. Se le colocará una mascarilla u otro dispositivo en la nariz o la boca. °· Traqueostomía. Durante el procedimiento, se hace un pequeño corte en el cuello para crear una apertura en la tráquea. Se coloca un tubo de respiración directamente en la tráquea. El tubo se conecta a un respirador.   Esto se realiza en caso de que necesite usar el respirador por un período prolongado, o si tiene problemas en las vías respiratorias. °· Colocarlo para que se recueste boca abajo (decúbito prono). °· Medicamentos, por  ejemplo: °? Sedantes para que se relaje. °? Medicamentos para la presión arterial. °? Antibióticos para tratar las infecciones. °? Anticoagulantes para prevenir coágulos de sangre. °? Diuréticos para prevenir el exceso de líquido. °· Líquidos y nutrientes a través de un tubo (catéter) intravenoso. °· Usar medias de compresión para prevenir coágulos de sangre. °· Oxigenación por membrana extracorpórea (OMEC). En este tratamiento se saca sangre del organismo, se le agrega oxígeno y se le elimina el dióxido de carbono. Luego, la sangre vuelve al organismo. Este tratamiento se usa generalmente solo en casos graves. °Siga estas indicaciones en su casa: °· Tome los medicamentos de venta libre y los recetados solamente como se lo haya indicado el médico. °· No consuma ningún producto que contenga nicotina o tabaco, como cigarrillos y cigarrillos electrónicos. Si necesita ayuda para dejar de fumar, consulte al médico. °· Limite el consumo de alcohol a no más de 1 medida por día si es mujer y no está embarazada, y a 2 medidas si es hombre. Una medida equivale a 12 oz (355 ml) de cerveza, 5 oz (148 ml) de vino o 1½ oz (44 ml) de bebidas alcohólicas de alta graduación. °· Pídales a sus amigos y familiares que lo ayuden si las actividades cotidianas le producen cansancio. °· Asista a rehabilitación pulmonar tal como se lo haya indicado el médico. Esto puede incluir lo siguiente: °? Educación sobre la afección. °? Ejercicios. °? Capacitación para respirar. °? Psicoterapia. °? Aprender técnicas para conservar la energía. °? Asesoramiento sobre nutrición. °· Concurra a todas las visitas de control como se lo haya indicado el médico. Esto es importante. °Comuníquese con un médico si: °· Le falta el aire al hacer una actividad o al estar en reposo. °· Tiene tos que no desaparece. °· Tiene fiebre. °· Los síntomas no mejoran o empeoran. °· Se siente ansioso o deprimido. °Solicite ayuda de inmediato si: °· Le falta el aire de forma  repentina. °· Siente un dolor repentino en el pecho que no desaparece. °· Se le acelera la frecuencia cardíaca. °· Presenta dolor o hinchazón en una de las piernas. °· Tose y escupe sangre. °· Tiene dificultad para respirar. °· Su piel, labios o uñas se tornan azules. °Estos síntomas pueden representar un problema grave que constituye una emergencia. No espere hasta que los síntomas desaparezcan. Solicite atención médica de inmediato. Comuníquese con el servicio de emergencias de su localidad (911 en los [place]). No conduzca por sus propios medios hasta el hospital. °Resumen °· El síndrome de dificultad respiratoria aguda es una afección potencialmente mortal en la que se acumula líquido en los pulmones, y esto provoca la falla de los pulmones o demás órganos vitales. °· Por lo general, la afección aparece después de una infección, enfermedad, cirugía o lesión. °· Los síntomas principales del síndrome de dificultad respiratoria aguda son la falta de aire repentina y la respiración acelerada. °· El tratamiento puede incluir oxigenoterapia, presión positiva de las vías aéreas continua (PPC), traqueostomía, recostarse boca abajo (decúbito prono), medicamentos, líquidos y nutrientes suministrados por un tubo (catéter) intravenoso, medias de compresión y oxigenación por membrana extracorpórea (OMEC). °Esta información no tiene como fin reemplazar el consejo del médico. Asegúrese de hacerle al médico cualquier pregunta que tenga. °Document Released: 01/17/2009 Document Revised: 04/15/2017 Document Reviewed: 04/15/2017 °Elsevier Interactive Patient Education © 2019 Elsevier   Inc.   Lesin renal aguda en los adultos Acute Kidney Injury, Adult  La lesin renal aguda es un empeoramiento repentino de la funcin renal. Los riones son rganos que realizan varias tareas. Filtran la sangre para extraer los desechos y el exceso de lquido. Adems, mantienen un equilibrio saludable de los minerales y las hormonas del  [place], lo que ayuda a Chief Technology Officer la presin arterial y [employer] fuertes. Con esta afeccin, los riones no realizan sus tareas tan bien como deberan. La afeccin vara de leve a grave. Con el paso del [place], podra transformarse en una enfermedad renal de larga duracin (crnica). La deteccin y el tratamiento tempranos podran evitar que la lesin renal aguda se transforme en una afeccin crnica. Cules son las causas? Las causas ms frecuentes de esta afeccin [employer] siguientes:  Problemas con el flujo sanguneo a los riones. Las causas de esto pueden ser las siguientes: ? Presin arterial baja (hipotensin) o choque. ? [place]. ? Enfermedades cardacas y de los vasos sanguneos (cardiovasculares). ? Quemaduras graves. ? Enfermedad heptica.  Lesin [employer] riones. Las causas de esto pueden ser las siguientes: ? Ciertos medicamentos. ? Infeccin renal. ? Intoxicaciones. ? Cercana a sustancias txicas o contacto con estas. ? Una herida quirrgica. ? Golpe directo e intenso en la zona de los riones.  La obstruccin repentina del flujo de [place]. Las causas de esto pueden ser las siguientes: ? Cncer. ? Clculos en el rin. ? Prstata agrandada en los hombres. Cules son los signos o los sntomas? Es posible que los sntomas de esta afeccin no sean evidentes hasta que esta se torne grave. Los sntomas de esta afeccin pueden incluir lo siguiente:  Cansancio Engineer, manufacturing) o dificultad para Chief Strategy Officer.  Nuseas o vmitos.  Hinchazn (edema) de la cara, las piernas, los tobillos o los pies.  Problemas en la miccin, tales como: ? Dolor abdominal o dolor al costado del estmago (fosa lumbar). ? Disminucin de la produccin de [place]. ? Disminucin de la fuerza del flujo de la [place].  Contracciones y calambres musculares, especialmente en las piernas.  Confusin o dificultad para concentrarse.  Prdida del  apetito.  Cristy Hilts. Cmo se diagnostica? Esta afeccin se puede diagnosticar mediante diferentes estudios, por ejemplo:  Anlisis de [place].  Anlisis de [place].  Estudios de diagnstico por imgenes.  Un estudio en el que se toma una muestra de tejido de los riones para examinarla con un microscopio (biopsia de rin). Cmo se trata? El tratamiento de esta afeccin depende de la causa y la gravedad. En los casos leves, puede no ser necesario el tratamiento. Los riones pueden curarse por s solos. En los casos ms graves, el tratamiento podra incluir lo siguiente:  Librarian, academic causa de la lesin renal. Podra implicar cambiar algn medicamento que toma o ajustar las dosis de [place].  Lquidos. Podra necesitar lquidos intravenosos (i.v.) especiales para equilibrar las necesidades del organismo.  Tener colocado un catter para drenar la orina y evitar obstrucciones.  Prevencin de problemas. Por ejemplo, evitar ciertos medicamentos o procedimientos que pudieran daar ms los riones. En algunos casos, el tratamiento tambin podra requerir lo siguiente:  Procedimiento para Primary school teacher txicos del organismo (dilisis o terapia de reemplazo renal continuo).  [place]. Podra realizarse para reparar un desgarro del rin o para extraer una obstruccin del sistema urinario. Siga estas indicaciones en su casa: Medicamentos  [employer] de venta libre y los recetados solamente como se lo haya indicado el mdico.  No tome ningn medicamento nuevo sin la autorizacin del mdico. Muchos medicamentos pueden empeorar el dao renal.  No tome ningn suplemento vitamnico ni de minerales sin la autorizacin del mdico. Muchos suplementos nutricionales pueden empeorar el dao renal. Estilo de vida  Si el mdico le indic que hiciera cambios en la dieta, [place] indicaciones. Posiblemente deba disminuir el consumo de protenas en la dieta.  [place] un peso  saludable. Si necesita ayuda para lograrlo, consulte a su mdico.  Comience o contine un plan de ejercicios. Intente hacer ejercicios al menos 38minutos al da, 5das a la semana.  No consuma ningn producto que contenga tabaco, lo que incluye cigarrillos, tabaco de Higher education careers adviser y Psychologist, sport and exercise. Si necesita ayuda para dejar de fumar, consulte al mdico. Instrucciones generales  Lleve un control de la presin arterial. Informe los cambios en la presin arterial como se lo haya indicado el mdico.  Mantngase al da con las vacunas. Pregntele al mdico qu vacunas debe colocarse.  Concurra a todas las visitas de control como se lo haya indicado el mdico. Esto es importante. Dnde encontrar ms informacin  Asociacin Americana de Pacientes Renales (American Association of Kidney Patients, AAKP): BombTimer.gl  [place] ([place]): www.kidney.org  Fondo Americano para Problemas Renales ([place]): https://mathis.com/  Programa de rehabilitacin de Life Options: ? www.lifeoptions.org ? www.kidneyschool.org Comunquese con un mdico si:  Los sntomas empeoran.  Presenta nuevos sntomas. Solicite ayuda de inmediato si:  Le aparecen sntomas de empeoramiento de la enfermedad renal, por ejemplo: ? Dolores de [place]. ? La piel se oscurece o se aclara de manera anormal. ? Aparecen hematomas con facilidad. ? Hipo frecuente. ? Dolor en el pecho. ? Falta de aire. ? Falta de perodo menstrual en las mujeres. ? Convulsiones. ? Confusin o estado mental alterado. ? Dolor en el abdomen o en la espalda. ? [place].  Tiene fiebre.  El organismo produce [place] menor cantidad [place].  Siente dolor o tiene sangre al [employer]. Resumen  La lesin renal aguda es un empeoramiento repentino de la funcin renal.  Puede ser consecuencia de problemas en el flujo sanguneo que va hacia los riones, dao directo en los riones y obstruccin  repentina del flujo de la [place].  Es posible que los sntomas de esta afeccin no sean evidentes hasta que se torne grave. Algunos de los sntomas podran ser edema, letargo, confusin, nuseas o vmitos, y problemas para Garment/textile technologist.  Generalmente, esta afeccin puede diagnosticarse con anlisis de [place], anlisis de [place] y estudios de diagnstico por imgenes. A veces, se realiza una biopsia de rin para diagnosticarla.  El tratamiento de la afeccin suele incluir el tratamiento de la afeccin preexistente. Se trata con lquidos, medicamentos, dilisis, cambios en la dieta o [place]. Esta informacin no tiene Marine scientist el consejo del mdico. Asegrese de hacerle al mdico cualquier pregunta que tenga. Document Released: 10/07/2012 Document Revised: 02/26/2017 Document Reviewed: 06/19/2012 Elsevier Interactive Patient Education  [employer].

## 2019-04-19 NOTE — H&P (Signed)
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  Presented 02/23/2019 with increasing shortness of breath requiring intubation for airway protection follow-up critical care medicine for severe respiratory failure requiring ventilatory support related to SIRS COVID-19 infection.  Patient developed severe ATN requiring CRRT with prolonged respiratory failure extubated later underwent tracheostomy 03/16/2019 per Dr. Nelda Marseille as well as placement of permacath 03/17/2019 for renal failure requiring intermittent dialysis.  Renal ultrasound showed no hydronephrosis.  On 03/18/2019 patient with worsening hypoxemia and placed on intravenous heparin for suspect pulmonary emboli.  CT angiogram cannot be obtained at that time due to severe AKI.  Transitioned to Eliquis but was discontinued due to drop in hemoglobin required 2 units packed red blood cells and currently remains only on Lovenox with recommendations to continue for 3 months.  Lower extremity Dopplers were negative for DVT. He was transferred 03/30/2019 from [place] to [facility].  Repeat COVID testing negative x2 on 04/02/2019 and 04/03/2019 patient's. Diet has been advanced to regular after initially receiving nasogastric tube feeds  Currently with a #4 cuffless trach PMV as directed plan for capping per pulmonary services.  During patient's hospital course developed full-thickness related pressure injuries to bilateral cheeks while recovering from [place].  Wound care nurse follow-up for skin care.  Patient's renal function is stabilized latest creatinine 1.47 and no longer requiring dialysis.  Therapy evaluations completed and patient was admitted for a comprehensive rehab program.  Review of Systems  Unable to perform ROS: Language  Constitutional: Negative for fever.   HENT: Negative for hearing loss.   Eyes: Negative for blurred vision.  Respiratory: Positive for cough and shortness of breath.   Cardiovascular: Negative for chest pain.  Gastrointestinal: Negative for nausea and vomiting.  Genitourinary: Negative for dysuria.  Musculoskeletal: Negative for myalgias.  Skin: Negative for rash.  Neurological: Positive for weakness. Negative for dizziness.  Psychiatric/Behavioral: Negative for depression.   No past medical history on file.  The histories are not reviewed yet. Please review them in the "History" navigator section and refresh this [place]. No family history on file. Social History:  reports that he has never smoked. He has never used smokeless tobacco. He reports previous alcohol use. He reports previous drug use. Allergies: Not on File No medications prior to admission.    Drug Regimen Review  Drug regimen was reviewed and remains appropriate with no significant issues identified  Home: Home Living Family/patient expects to be discharged to:: Inpatient rehab Additional Comments: From [place]; works on farm through [employer] History: Prior Function Level of Independence: Independent Comments: migrant farm Insurance underwriter from [place]  Functional Status:  Mobility: Bed Mobility Overal bed mobility: Needs Assistance Bed Mobility: Supine to Sit Supine to sit: HOB elevated, Supervision Sit to supine: Min assist, HOB elevated General bed mobility comments: pt received OOB in recliner Transfers Overall transfer level: Needs assistance Equipment used: Art gallery manager via Lift Equipment: Stedy Transfers: Sit to/from [employer] to Stand: Min assist, Min guard, +2 safety/equipment Stand pivot transfers: Min assist, +2 physical assistance Squat pivot transfers: +2 physical assistance, +2 safety/equipment, Max assist General transfer comment: Intermittent verbal cues for hand placement. Assist to steady. Ambulation/Gait  Ambulation/Gait assistance: Min guard, +2 safety/equipment Gait Distance (Feet): 140 Feet Assistive device: 4-wheeled walker Gait Pattern/deviations: Step-through pattern, Decreased stride length, Narrow base  of support General Gait Details: Assist for safety and balance. Pt on 55% O2 with SpO2 to 85% with amb. Standing rest break with SpO2 returning to 90%. On return to room SpO2 86%. Returned to >90% after 1 minute.  HR to 130's with amb Gait velocity: decr Gait velocity interpretation: <1.31 ft/sec, indicative of household ambulator    ADL: ADL Overall ADL's : Needs assistance/impaired Eating/Feeding: Set up, Supervision/ safety Eating/Feeding Details (indicate cue type and reason): provided supervision for pt to spoon/drink tea (tea at appropriate thickness for current diet)  Grooming: Min guard, Minimal assistance, Standing, Wash/dry face Grooming Details (indicate cue type and reason): for standing balance Upper Body Bathing: Maximal assistance, Bed level Lower Body Bathing: Total assistance, Bed level Upper Body Dressing : Total assistance Lower Body Dressing: Minimal assistance, Moderate assistance, +2 for safety/equipment, Sit to/from stand Lower Body Dressing Details (indicate cue type and reason): pt bringing LEs up to adjust socks seated EOB, increased effort but no physical assist required Functional mobility during ADLs: Minimal assistance, +2 for safety/equipment, Rolling walker General ADL Comments: pt continues to demonstrate improvements with activity tolerance  Cognition: Cognition Overall Cognitive Status: Difficult to assess Orientation Level: Oriented X4 Cognition Arousal/Alertness: Awake/alert Behavior During Therapy: WFL for tasks assessed/performed Overall Cognitive Status: Difficult to assess General Comments: following commands well, pt continues to have increased awareness asking about his HR today Difficult to assess due to: Non-English speaking   Physical Exam: Blood pressure 131/86, pulse 93, temperature 98.6 F (37 C), temperature source Oral, resp. rate (!) 32, height 5\' 5"  (1.651 m), weight 66.1 kg, SpO2 96 %. Physical Exam  Constitutional: He is oriented to person, place, and time. He appears well-developed and well-nourished. No distress.  HENT:  Head: Normocephalic.  Eyes: Pupils are equal, round, and reactive to light. EOM are normal.  Neck: Normal range of motion. No thyromegaly present.  #4  Cuffless trach in place with red plug  Cardiovascular: Normal rate and regular rhythm.  No murmur heard. Respiratory: Effort normal. He has no wheezes. He has no rales.  A few scattered rhonchi.   GI: Soft. He exhibits no distension. There is no abdominal tenderness.  Musculoskeletal: Normal range of motion.        General: No edema.  Neurological: He is alert and oriented to person, place, and time.  Patient is alert sitting up in bed.  Follows some demonstrated commands, communicates in spanish without problems.  Makes good eye contact with examiner. UE 4/5 prox to distal. LE: 3/5 HF, KE and 4/5 ADF/PF. Senses pain in all 4's.   Skin: He is not diaphoretic.  A few scattered abrasions around face  Psychiatric: He has a normal mood and affect. His behavior is normal.    Results for orders placed or performed during the hospital encounter of 02/23/19 (from the past 48 hour(s))  Glucose, capillary     Status: Abnormal   Collection Time: 04/17/19 11:36 AM  Result Value Ref Range   Glucose-Capillary 192 (H) 70 - 99 mg/dL  Glucose, capillary     Status: Abnormal   Collection Time: 04/17/19  5:02 PM  Result Value Ref Range   Glucose-Capillary 145 (H) 70 - 99 mg/dL  Glucose, capillary     Status: Abnormal   Collection Time: 04/17/19  9:07 PM  Result Value Ref Range   Glucose-Capillary 124 (H) 70 - 99 mg/dL  Glucose, capillary     Status: Abnormal   Collection Time: 04/18/19  7:30 AM  Result Value Ref Range   Glucose-Capillary  132 (H) 70 - 99 mg/dL  Glucose, capillary     Status: Abnormal   Collection Time: 04/18/19 11:34 AM  Result Value Ref Range   Glucose-Capillary 156 (H) 70 - 99 mg/dL  Glucose, capillary     Status: Abnormal   Collection Time: 04/18/19  4:10 PM  Result Value Ref Range   Glucose-Capillary 144 (H) 70 - 99 mg/dL  Glucose, capillary     Status: Abnormal   Collection Time: 04/18/19  9:12 PM  Result Value Ref Range   Glucose-Capillary 226 (H) 70 - 99 mg/dL  CBC     Status: Abnormal   Collection Time: 04/19/19  6:09 AM  Result Value Ref Range   WBC 10.0 4.0 - 10.5 K/uL   RBC 2.99 (L) 4.22 - 5.81 MIL/uL   Hemoglobin 8.8 (L) 13.0 - 17.0 g/dL   HCT 28.4 (L) 39.0 - 52.0 %   MCV 95.0 80.0 - 100.0 fL   MCH 29.4 26.0 - 34.0 pg   MCHC 31.0 30.0 - 36.0 g/dL   RDW 14.8 11.5 - 15.5 %   Platelets 429 (H) 150 - 400 K/uL   nRBC 0.0 0.0 - 0.2 %    Comment: Performed at [facility], [place]. [address] [place], [place] [address]  Glucose, capillary     Status: Abnormal   Collection Time: 04/19/19  8:10 AM  Result Value Ref Range   Glucose-Capillary 150 (H) 70 - 99 mg/dL   No results found.     Medical Problem List and Plan: 1.  Debility secondary to ARDS due to SARS COVID-19 infection.  Repeat COVID test x2-  -admit to inpatient rehab 2.  Antithrombotics: -DVT/anticoagulation: Subcutaneous Lovenox 60 mg every 12 hours for suspect pulmonary emboli.  CT angiogram of chest not completed due to AKI.  Lower extremity Dopplers negative  -antiplatelet therapy: N/A 3. Pain Management: Oxycodone as needed 4. Mood: Klonopin 0.25 mg daily  -antipsychotic agents: N/A 5. Neuropsych: This patient is capable of making decisions on his own behalf. 6. Skin/Wound Care: Routine skin care 7. Fluids/Electrolytes/Nutrition: Routine in and outs with follow-up chemistries 8.  AKI.  Status post permacath 03/17/2019.  Latest creatinine 1.47.  Patient did initially receive intermittent dialysis.   -serial  monitoring of renal function while on rehab 9.  Acute blood loss anemia.  Patient has been transfused during hospital course.  Follow-up CBC.  Continue iron supplement 10.  Tracheostomy 03/16/2019.  Currently with #4 cuffless trach.  Capped today by CCM. If tolerates well, decannulate over the next 1-2 days. 11.  Intermittent sinus tachycardia.  Continue Lopressor 25 mg twice daily 12.  Diabetes mellitus.  Hemoglobin A1c 5.5.  Lantus insulin 12 units twice daily.  Monitor closely with increased mobility, adjust regimen as needed      Cathlyn Parsons, PA-C 04/19/2019

## 2019-04-19 NOTE — Progress Notes (Signed)
Occupational Therapy Treatment Patient Details Name: Jonathan [name] MRN: [id number] DOB: 04/17/1971 Today's Date: 04/19/2019    History of present illness 48 y.o. male admitted on 02/23/19 with AMS, O2 sats in the ED were 50%.  Pt dx with COVID 19 and resultant ARDS.  He was intubated 4/21-5/11. Reintubated and trached 5/12. Off vent and on Tbar since 5/21.  His course is complicated by acute kidney injury requiring CVVHD.  Pt transferred [facility] to [facility] on 5/26 and changed to intermittent HD. Pt tested Covid negative 5/29 and 5/30.    OT comments  Pt progressing towards established OT goals and is very motivated to participate in therapy. Pt performing functional mobility to sink with Min A presenting with decreased balance and stability. Pt brushing his teeth and brushing his hair while standing at sink with Min A for balance. Pt presenting with decreased activity tolerance; cueing pt for purse lip breathing and standing rest break. Pt SpO2 dropping to 82% on 2L O2 and elevating to 87% with purse lip breathing. Pt requiring seated rest break to return to 90s on 2L.    Follow Up Recommendations  CIR    Equipment Recommendations  3 in 1 bedside commode    Recommendations for Other Services Rehab consult    Precautions / Restrictions Precautions Precautions: Fall Precaution Comments: Lurline Idol not plugged per critical care 04/19/19 Other Brace: Prevalon =- no longer needs Restrictions Weight Bearing Restrictions: No       Mobility Bed Mobility Overal bed mobility: Needs Assistance             General bed mobility comments: In recliner upon arrival  Transfers Overall transfer level: Needs assistance Equipment used: 1 person hand held assist Transfers: Sit to/from Stand Sit to Stand: Min assist         General transfer comment: Min A for balance and stability    Balance Overall balance assessment: Needs assistance Sitting-balance support: No upper extremity  supported;Feet supported Sitting balance-Leahy Scale: Fair Sitting balance - Comments: posterior lean requiring min assist as he does LE exercises.     Standing balance support: Single extremity supported Standing balance-Leahy Scale: Fair Standing balance comment: UE assist                           ADL either performed or assessed with clinical judgement   ADL Overall ADL's : Needs assistance/impaired     Grooming: Min guard;Standing;Minimal assistance;Oral care;Brushing hair               Lower Body Dressing: Minimal assistance;Moderate assistance;+2 for safety/equipment;Sit to/from stand Lower Body Dressing Details (indicate cue type and reason): pt bringing LEs up to adjust socks seated EOB, increased effort but no physical assist required             Functional mobility during ADLs: Minimal assistance General ADL Comments: Pt continues to present with decreased activity tolerance.      Vision       Perception     Praxis      Cognition Arousal/Alertness: Awake/alert Behavior During Therapy: WFL for tasks assessed/performed Overall Cognitive Status: Difficult to assess                                 General Comments: following commands well, pt continues to have increased awareness asking about his HR today  Exercises     Shoulder Instructions       General Comments Using phone translator. 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)    Prior Functioning/Environment          Comments: [place] form [place]   Frequency  [place]  OT Goals(current goals can now be found in the care plan section)  Progress towards OT goals: Progressing toward goals  Acute Rehab OT Goals Patient Stated Goal: agreeable to walk and freshen up at sink OT Goal Formulation: With patient Time For Goal Achievement: 04/19/19 Potential to Achieve Goals: Good ADL Goals Pt Will Perform Grooming: with supervision;standing Pt Will Perform Upper Body Bathing: with supervision;sitting Pt Will Perform Lower Body Bathing: with supervision;sit to/from stand Pt Will Perform Lower Body Dressing: with supervision;sit to/from stand Pt Will Transfer to Toilet: with supervision;ambulating Pt Will Perform Toileting - Clothing Manipulation and hygiene: with supervision;sit to/from stand Pt/caregiver will Perform Home Exercise Program: Increased strength;Increased ROM;Both right and left upper extremity;With Supervision Additional ADL Goal #1: Pt will maintain midline postural control EOB x 10 min wiht mod A with VSS in preparation for ADL Additional ADL Goal #2: Pt will tolerate OOB in chair x 1 hour and engage in therapeutic activity.  Plan Discharge plan remains appropriate    Co-evaluation                 AM-PAC OT "6 Clicks" Daily Activity     Outcome Measure   Help from another person eating meals?: A Little Help from another person taking care of personal grooming?: A Little Help from another person toileting, which includes using toliet, bedpan, or urinal?: A Lot Help from another person bathing (including washing, rinsing, drying)?: A Lot Help from another person to put on and taking off regular upper body clothing?: A Lot Help from another person to put on and taking off regular lower body clothing?: A  Lot 6 Click Score: 14    End of Session Equipment Utilized During Treatment: Oxygen  OT Visit Diagnosis: Other abnormalities of gait and mobility (R26.89);Muscle weakness (generalized) (M62.81) Pain - Right/Left: Right Pain - part of body: Shoulder   Activity Tolerance Patient tolerated treatment well   Patient Left in chair;with call bell/phone within reach;with nursing/sitter in room   Nurse Communication Mobility status        Time: 1457-1531 OT Time Calculation (min): 34 min  Charges: OT General Charges $OT Visit: 1 Visit OT Treatments $Self Care/Home Management : 23-37 mins  [place], OTR/L Acute Rehab Pager: [phone] Office: [place] 04/19/2019, 3:53 PM

## 2019-04-19 NOTE — Discharge Summary (Signed)
Discharge Summary  Jonathan [name] FAO:[id number] DOB: [birth year]  PCP: Patient, No Pcp Per  Admit date: 02/23/2019 Discharge date: 04/19/2019  Time spent: 35 minutes  Recommendations for Outpatient Follow-up:  1. Continue physical rehab at [facility].  2. -per PCCM Cap trach overnight, if no desaturation then will consider decannulation in the next 1-2 day.  Discharge Diagnoses:  Active Hospital Problems   Diagnosis Date Noted   Acute respiratory failure with hypoxia and hypercapnia (HCC)    Encounter for intubation    History of ETT    Status post tracheostomy ([place])    Tracheostomy care ([place])    On tube feeding diet    ATN (acute tubular necrosis) ([place]) 03/12/2019   AKI (acute kidney injury) ([place])    Acute respiratory failure with hypoxia ([place])    COVID-19    Pressure injury of skin 02/28/2019   ARDS (adult respiratory distress syndrome) ([place]) 02/23/2019    Resolved Hospital Problems  No resolved problems to display.    Discharge Condition: Stable  Diet recommendation: Resume previous diet  Vitals:   04/19/19 1142 04/19/19 1249  BP: 123/89 112/80  Pulse: (!) 111 93  Resp: (!) 34 (!) 35  Temp: 98.8 F (37.1 C)   SpO2: 95% 97%    History of present illness:  Brief Narrative:48 year old Spanish-speaking male from [place], was admitted on 4/21 with increased respiratory distress, he was intubated on admission for ARDS due to SARS COVID-19 infection. -Unfortunately developed severe ATN required CRRT, prolonged respiratory failure , was extubated and reintubated earlier this month, subsequently underwent tracheostomy on 5/12 , followed by placement of a permacath on 5/13 -Ventrally liberated off the ventilator 5/21 however remained anuric requiring intermittent hemodialysis subsequently transferred to [facility] 5/26 from [facility]. -Was also started on anticoagulation while in ICU for suspected pulmonary embolism -5/29 passed swallow evaluation, his core  track was removed and he was started on dysphagia diet with nectar thick liquids, last HD 5/30,hopeful for renal recovery -Repeat COVID tested negative x2 on 5/29 and 5/30 Events: 02/23/2019 admitted to the ICU. 02/24/2019 started on CRRT 4/21-4/24 Proned daily 4/28 -4/30 proned daily  03/04/2019 increase in oxygen demand required paralytic for 24 hours. 03/09/2019 hematochezia from gastric aspiration 5/11 extubated 5/12 re-intubated, tracheostomy 5/13 perm cath 5/14 worsening hypoxemia, shock suddenly, RV dilated, no new CXR changes, treated for PE empirically 5/21 doing t-piece trials 5/23: Trial off of CVVHD 5/24: 2 units PRBC 5/25 remains off of ventilator support since May 21, remains anuric, 5/26 patient transferred to [facility] from [facility] as he needed intermittent hemodialysis  5/29 passed modified swallow eval 5/30 started on nectar thick liquis and taking pills by mouth,dialysis  Lines and tubes: 4/21 - ETT >>5.11.2020 4/21 - RIJ CVC, removed 4/22 L IJ HD Cath> removed 5/13 Permacath right 6/6 [facility] DCD  Antibiotics: 02/23/2019-425 2020 azithromycin and Rocephin 02/23/2019-02/24/2019 03/06/2019 Rocephin 5/11-5/17Vancomycin 5/12-5/18Zosyn 5/12-5/17Fluconazole for candida in tracheal aspirate and recent Actemra I took over his care 04/07/2019. Overnight patient had an episode of bleeding from the pressure ulcer on his face which needed epi and lidocaine by ENT. Lovenox was stopped. He is on Lovenox for presumed pulmonary embolism.  Avon taken out 04/10/2019.  Recap last 7 days-patient remains on 35% FiO2 pulmonary following at a distance. Repeat swallow evaluation modified barium swallowwas done 04/12/2019 and he was started on a regular diet with thin liquids. This was done yesterday 04/12/2019. After he took his first meal patient became more hypoxic and tachycardic it appears  that he ate too fast and too much at one time. His renal functions improved so much that no more  dialysis is needed temporary dialysis catheter was taken out 04/10/2019. Patient has bilateral cheek ulcers from pressure injury from pronating while intubated when he had COVID pneumonia. The right cheek has been healed. The left cheek had bleeding profusely when ENT had to be consulted and they injected epi and lidocaine. ENT recommends biopsy if it does not heal in 1 week. However his left cheek wound is healing slowly. He is also followed by [facility] when patient is medically stable he will be discharged to [facility]. He is a Psychologist, sport and exercise.His whole family is in [place].  04/19/19: Patient seen and examined at his bedside.  No acute events overnight.  Currently saturating 97% on 35% FiO2.  No new concerns.  On the day of discharge, the patient was hemodynamically stable.  He will need to continue physical therapy at [facility].   Hospital Course:  Active Problems:   ARDS (adult respiratory distress syndrome) (HCC)   Pressure injury of skin   Acute respiratory failure with hypoxia ([place])   COVID-19   AKI (acute kidney injury) ([place])   ATN (acute tubular necrosis) ([place])   Tracheostomy care ([place])   On tube feeding diet   Status post tracheostomy ([place])   History of ETT   Encounter for intubation   Acute respiratory failure with hypoxia and hypercapnia (HCC)  Acute Hypoxic Resp. Failure due to Covid 19 Viral Illness/ARDS/septic shock requiring prone ventilation and nitric oxide for ARDS related to COVID-19, improving. -Treated at [facility] with IV steroids, IV Actemra on 4/22, -Also received convalescent plasma on 5/3 -Status post tracheostomy 5/12 -Weaned off the ventilator 5/21 -FiO2 of35% with O2 sat 97% -Maintain O2 sat >92% -Cap trach overnight, per PCCM if no desaturation then will consider decannulation in the next 1-2 day.  Anemia due to critical illness/iron deficiency -he had small amount of bleeding from the tracheostomy site received 3 units of blood transfusion overall during this hospital stay.   -He also had bleeding from the pressure ulcer on the left side of his face which has been resolved. -H&H improved and stable at 8.8/28.4 on 04/19/19 from Hg 7.8 on 04/15/19. -No sign of overt bleeding -c/w ferrous sulfate  Resolving AKI due to ATN/Covid Creatinine today 1.05 from 1.47 on 04/15/19. -Baseline creatinine was normal 2 months ago, he started CRRT on 4/21 and transition to intermittent hemodialysis 5/28, was undergoing HD Tuesday Thursday Saturday via TDC,his renal functions have significantly improved and dialysis has been stopped at this time. Nephrology signed off 04/11/2019  Intermittent sinus tachycardia Heart rate has been ranging between 105 and 133 Increase metoprolol tartrate dose from 12.5 mg twice daily to 25 mg twice daily Asymptomatic.  Presumed acute PE -On 5/14 patient had a sudden decline with shock, worsening hypoxemia and echocardiogram which showed right heart strain -CT angiogram could not be obtained due to severe AKI -He was started on IV heparin for presumed PE, Dopplers were negative -3 months of anticoagulation recommended -On full dose Lovenox subcu twice daily -Lovenox restarted 04/08/2019 since he did not have any further active bleeding.  Dysphagia, resolved -Insetting of subacute respiratory failure, tracheostomy -SLPrecommends dysphagia 1 puree nectar thick liquid initially -Repeat swallow evaluation done on 04/12/2019 recommendations for regular solids, thin liquid. -Tolerating reg diet well  Hyperglycemia -No clear history of diabetes, hemoglobin A1c was 5.5 -Remains on Lantus 12 units twice daily and sliding scale insulin, CBGs  are less than 170  Pressure injury on the skin of nose. Bilateral cheeksnot present on admission Local wound care.With soap and water daily. -Appears to be healing  DVT Prophylaxis:Full dose Lovenox twice daily   Code Status:Partial code   Pressure Injury 03/03/19 Face Right Unstageable - Full  thickness tissue loss in which the base of the ulcer is covered by slough (yellow, tan, gray, green or brown) and/or eschar (tan, brown or black) in the wound bed. 2cm x2 1/4 cm red edges with blac (Active)  03/03/19 0830  Location: Face  Location Orientation: Right  Staging: Unstageable - Full thickness tissue loss in which the base of the ulcer is covered by slough (yellow, tan, gray, green or brown) and/or eschar (tan, brown or black) in the wound bed.  Wound Description (Comments): 2cm x2 1/4 cm red edges with black cneter  Present on Admission: No    Pressure Injury 03/03/19 Face Left Unstageable - Full thickness tissue loss in which the base of the ulcer is covered by slough (yellow, tan, gray, green or brown) and/or eschar (tan, brown or black) in the wound bed. 1 1/2 cm x 2 1/2 cm red edges with y (Active)  03/03/19 0830  Location: Face  Location Orientation: Left  Staging: Unstageable - Full thickness tissue loss in which the base of the ulcer is covered by slough (yellow, tan, gray, green or brown) and/or eschar (tan, brown or black) in the wound bed.  Wound Description (Comments): 1 1/2 cm x 2 1/2 cm red edges with yellow center and black ring around yellow area  Present on Admission: No      Discharge Exam: BP 112/80    Pulse 93    Temp 98.8 F (37.1 C) (Oral)    Resp (!) 35    Ht 5\' 5"  (1.651 m)    Wt 66.1 kg    SpO2 97%    BMI 24.25 kg/m   General: 48 y.o. year-old male Pleasant, well developed well nourished in no acute distress.  Alert and oriented x3. Trach collar in place.  Cardiovascular: Regular rate and rhythm with no rubs or gallops.  No thyromegaly or JVD noted.    Respiratory: Clear to auscultation with no wheezes or rales. Good inspiratory effort.  Abdomen: Soft nontender nondistended with normal bowel sounds x4 quadrants.  Musculoskeletal: No lower extremity edema. 2/4 pulses in all 4 extremities.  Skin: No ulcerative lesions noted or  rashes,  Psychiatry: Mood is appropriate for condition and setting  Discharge Instructions You were cared for by a hospitalist during your hospital stay. If you have any questions about your discharge medications or the care you received while you were in the hospital after you are discharged, you can call the unit and asked to speak with the hospitalist on call if the hospitalist that took care of you is not available. Once you are discharged, your primary care physician will handle any further medical issues. Please note that NO REFILLS for any discharge medications will be authorized once you are discharged, as it is imperative that you return to your primary care physician (or establish a relationship with a primary care physician if you do not have one) for your aftercare needs so that they can reassess your need for medications and monitor your lab values.   Allergies as of 04/19/2019   Not on File     Medication List    TAKE these medications   clonazePAM 0.25 MG disintegrating tablet Commonly known  as: KLONOPIN Take 1 tablet (0.25 mg total) by mouth daily. Start taking on: April 20, 2019   enoxaparin 60 MG/0.6ML injection Commonly known as: LOVENOX Inject 0.6 mLs (60 mg total) into the skin every 12 (twelve) hours.   feeding supplement (ENSURE ENLIVE) Liqd Take 237 mLs by mouth 2 (two) times daily between meals.   ferrous sulfate 325 (65 FE) MG tablet Take 1 tablet (325 mg total) by mouth daily with breakfast. Start taking on: April 20, 2019   insulin aspart 100 UNIT/ML injection Commonly known as: novoLOG Inject 6 Units into the skin 3 (three) times daily with meals.   insulin glargine 100 UNIT/ML injection Commonly known as: LANTUS Inject 0.12 mLs (12 Units total) into the skin 2 (two) times daily.   ipratropium 17 MCG/ACT inhaler Commonly known as: ATROVENT HFA Inhale 2 puffs into the lungs 3 (three) times daily.   levalbuterol 45 MCG/ACT inhaler Commonly known as:  XOPENEX HFA Inhale 2 puffs into the lungs 3 (three) times daily.   metoprolol tartrate 25 MG tablet Commonly known as: LOPRESSOR Take 1 tablet (25 mg total) by mouth 2 (two) times daily.   pantoprazole 40 MG tablet Commonly known as: PROTONIX Take 1 tablet (40 mg total) by mouth 2 (two) times daily.            Durable Medical Equipment  (From admission, onward)         Start     Ordered   04/14/19 1724  For home use only DME 3 n 1  Once    Comments: 3 n 1 bedside comode   04/14/19 1724         Not on File Follow-up [place] Follow up on 04/28/2019.   Why: 1:30 for hospital follow up apt Contact information: [address] [place] [address] [phone]           The results of significant diagnostics from this hospitalization (including imaging, microbiology, ancillary and laboratory) are listed below for reference.    Significant Diagnostic Studies: Dg Abd 1 View  Result Date: 04/03/2019 CLINICAL DATA:  Hypoxia, nausea, vomiting EXAM: ABDOMEN - 1 VIEW COMPARISON:  04/02/2019 FINDINGS: Oral contrast material noted within the colon which is decompressed. Nonobstructive bowel gas pattern. No organomegaly or free air. IMPRESSION: No evidence of bowel obstruction or free air.  No acute findings. Electronically Signed   By: Rolm Baptise M.D.   On: 04/03/2019 21:20   Dg Abd 1 View  Result Date: 03/31/2019 CLINICAL DATA:  Check gastric catheter placement EXAM: ABDOMEN - 1 VIEW COMPARISON:  03/27/2019 FINDINGS: Gastric catheter is noted within the stomach. Proximal side port lies at the gastroesophageal junction. This could be advanced a few cm further into the stomach. IMPRESSION: Gastric catheter within the stomach although could be advanced further into the stomach. Electronically Signed   By: Inez Catalina M.D.   On: 03/31/2019 03:21   Dg Abd 1 View  Result Date: 03/27/2019 CLINICAL DATA:   Nasogastric tube placement EXAM: ABDOMEN - 1 VIEW COMPARISON:  03/26/2019. FINDINGS: 1715 hours. The nasogastric tube remains looped in the proximal stomach, unchanged. The visualized bowel gas pattern is normal. No evidence of bowel wall thickening or free intraperitoneal air. There are no suspicious abdominal calcifications. IMPRESSION: Unchanged position of the nasogastric tube, looped in the proximal stomach. Electronically Signed   By: Richardean Sale M.D.   On: 03/27/2019 17:55   Ct Head  Wo Contrast  Result Date: 03/31/2019 CLINICAL DATA:  Who is fall with forehead laceration, initial encounter EXAM: CT HEAD WITHOUT CONTRAST TECHNIQUE: Contiguous axial images were obtained from the base of the skull through the vertex without intravenous contrast. COMPARISON:  None. FINDINGS: Brain: No evidence of acute infarction, hemorrhage, hydrocephalus, extra-axial collection or mass lesion/mass effect. Vascular: No hyperdense vessel or unexpected calcification. Skull: Normal. Negative for fracture or focal lesion. Sinuses/Orbits: No acute finding. Other: Mild soft tissue swelling is noted in the right forehead consistent with the recent injury. IMPRESSION: Soft tissue swelling in the right forehead consistent with the recent injury. No acute intracranial abnormality is noted. Electronically Signed   By: Inez Catalina M.D.   On: 03/31/2019 01:36   Dg Chest Port 1 View  Result Date: 04/14/2019 CLINICAL DATA:  Shortness of breath and cough EXAM: PORTABLE CHEST 1 VIEW COMPARISON:  04/12/2019 FINDINGS: Cardiac shadow is within normal limits. Tracheostomy tube remains in place. The overall inspiratory effort is poor. Patchy infiltrates are again identified but mildly improved. No new focal abnormality is noted. IMPRESSION: Slight improved aeration when compared with the previous day. Electronically Signed   By: Inez Catalina M.D.   On: 04/14/2019 09:11   Dg Chest Port 1 View  Result Date: 04/12/2019 CLINICAL DATA:   Worsening shortness of breath EXAM: PORTABLE CHEST 1 VIEW COMPARISON:  04/03/2019 FINDINGS: Tracheostomy remains in place. Central line is been removed. Heart size is normal allowing for technical factors. Widespread patchy bilateral pulmonary infiltrates persist. These are similar or slightly progressed. No visible effusion. IMPRESSION: Persistent bilateral patchy pulmonary infiltrates, similar or slightly worsened. Electronically Signed   By: Nelson Chimes M.D.   On: 04/12/2019 19:20   Dg Chest Port 1 View  Result Date: 04/03/2019 CLINICAL DATA:  Hypoxia, nausea, vomiting EXAM: PORTABLE CHEST 1 VIEW COMPARISON:  03/25/2019 FINDINGS: Patchy bilateral airspace disease again noted, minimally improved since prior study. Low lung volumes. Right dialysis catheter remains in the right atrium. Tracheostomy tube is unchanged. Interval removal of NG tube. Heart is normal size. IMPRESSION: Low lung volumes. Patchy bilateral airspace disease, slightly improved. Electronically Signed   By: Rolm Baptise M.D.   On: 04/03/2019 21:20   Dg Chest Port 1 View  Result Date: 03/25/2019 CLINICAL DATA:  Follow-up pneumonia, positive COVID-19 EXAM: PORTABLE CHEST 1 VIEW COMPARISON:  03/21/2019 FINDINGS: Cardiac shadows within normal limits. Tracheostomy tube, gastric catheter and dialysis catheter are noted in satisfactory position. Diffuse airspace opacities are noted throughout both lungs stable from the prior exam. No bony abnormality is noted. IMPRESSION: No change in bilateral infiltrates. Electronically Signed   By: Inez Catalina M.D.   On: 03/25/2019 07:45   Dg Chest Port 1 View  Result Date: 03/21/2019 CLINICAL DATA:  Acute respiratory failure EXAM: PORTABLE CHEST 1 VIEW COMPARISON:  03/18/2019 FINDINGS: Tracheostomy in satisfactory position. Right IJ venous catheter terminates in the right atrium. Left IJ venous catheter has been removed. Enteric tube terminates in the gastric cardia. Multifocal patchy opacities, right  upper lobe predominant, grossly in pains. No pleural effusion or pneumothorax. The heart is normal in size. IMPRESSION: Tracheostomy in satisfactory position. Additional support apparatus as above. Multifocal pneumonia, grossly unchanged. Electronically Signed   By: Julian Hy M.D.   On: 03/21/2019 05:20   Dg Abd Portable 1v  Result Date: 04/02/2019 CLINICAL DATA:  Abdominal pain EXAM: PORTABLE ABDOMEN - 1 VIEW COMPARISON:  03/31/2019 FINDINGS: Nonobstructed bowel gas pattern with residual contrast material in nondilated small bowel  and colon. This limits evaluation for radiopaque calculi. IMPRESSION: Nonobstructed gas pattern with residual contrast material in the large and small bowel Electronically Signed   By: Donavan Foil M.D.   On: 04/02/2019 20:41   Dg Abd Portable 1v  Result Date: 03/31/2019 CLINICAL DATA:  Nasogastric placement EXAM: PORTABLE ABDOMEN - 1 VIEW COMPARISON:  Earlier same day FINDINGS: Two views show any is a gastric tube entering the stomach with its tip in the fundus. Contrast present throughout a normal appearing colon. IMPRESSION: Nasogastric tube tip in the gastric fundus. Electronically Signed   By: Nelson Chimes M.D.   On: 03/31/2019 12:19   Dg Abd Portable 1v  Result Date: 03/26/2019 CLINICAL DATA:  Nasogastric tube placement EXAM: PORTABLE ABDOMEN - 1 VIEW COMPARISON:  03/19/2019 FINDINGS: Nasogastric tube is looped in the proximal stomach. Dialysis catheter with tip at the lower right atrium. Normal bowel gas pattern. IMPRESSION: 1. Nasogastric tube tip is in the stomach. 2. Dialysis catheter tip is near the inferior cavoatrial junction. Electronically Signed   By: Monte Fantasia M.D.   On: 03/26/2019 04:14   Dg Swallowing Func-speech Pathology  Result Date: 04/12/2019 Objective Swallowing Evaluation: Type of Study: MBS-Modified Barium Swallow Study  Patient Details Name: Jonathan [name] MRN: [id number] Date of Birth: 1971-06-16 Today's Date: 04/12/2019 Time:  SLP Start Time (ACUTE ONLY): 1200 -SLP Stop Time (ACUTE ONLY): 1220 SLP Time Calculation (min) (ACUTE ONLY): 20 min Past Medical History: No past medical history on file.  HPI: Pt is a 48 y.o. male admitted on 02/23/19 with AMS, O2 sats in the ED were 50%.  Pt dx with COVID 19 and resultant ARDS.  He was intubated 4/21-5/11; reintubated and then trached on 5/12. His course was complicated by acute kidney injury requiring CVVHD.  PMH includes DM2, HTN.. Pt transferred from [place] to [facility] on 5/26; now requiring intermittent HD.  Subjective: pleasant, cooperative Assessment / Plan / Recommendation CHL IP CLINICAL IMPRESSIONS 04/12/2019 Clinical Impression Paitent presents with a signfiicantly improved swallow as compared to MBS on 5/26. He did not exhibit any penetration or aspiration with any of the tested boluses (thin, nectar, puree, regular, tablet)and UES opening and bolus transit was [facility]. The only deficit noted was trace-mild vallecular residuals post swallows of puree and regular solids, but with full clearance from pharynx post subsequent swallows. PMV was in place during entire test. SLP Visit Diagnosis Dysphagia, pharyngeal phase (R13.13) Attention and concentration deficit following -- Frontal lobe and executive function deficit following -- Impact on safety and function No limitations;Mild aspiration risk   CHL IP TREATMENT RECOMMENDATION 04/12/2019 Treatment Recommendations Therapy as outlined in treatment plan below   Prognosis 04/12/2019 Prognosis for Safe Diet Advancement Good Barriers to Reach Goals -- Barriers/Prognosis Comment -- CHL IP DIET RECOMMENDATION 04/12/2019 SLP Diet Recommendations Regular solids;Thin liquid Liquid Administration via Straw;Cup Medication Administration Whole meds with liquid Compensations Minimize environmental distractions;Slow rate;Small sips/bites Postural Changes Seated upright at 90 degrees   CHL IP OTHER RECOMMENDATIONS 04/12/2019 Recommended Consults -- Oral Care Recommendations  Oral care BID Other Recommendations Place PMSV during PO intake   CHL IP FOLLOW UP RECOMMENDATIONS 04/12/2019 Follow up Recommendations Inpatient Rehab   CHL IP FREQUENCY AND DURATION 04/12/2019 Speech Therapy Frequency (ACUTE ONLY) min 2x/week Treatment Duration 2 weeks      CHL IP ORAL PHASE 04/12/2019 Oral Phase WFL Oral - Pudding Teaspoon -- Oral - Pudding Cup -- Oral - Honey Teaspoon -- Oral - Honey Cup -- Oral - Nectar Teaspoon --  Oral - Nectar Cup -- Oral - Nectar Straw -- Oral - Thin Teaspoon -- Oral - Thin Cup -- Oral - Thin Straw -- Oral - Puree -- Oral - Mech Soft -- Oral - Regular -- Oral - Multi-Consistency -- Oral - Pill -- Oral Phase - Comment --  CHL IP PHARYNGEAL PHASE 04/12/2019 Pharyngeal Phase Impaired Pharyngeal- Pudding Teaspoon -- Pharyngeal -- Pharyngeal- Pudding Cup -- Pharyngeal -- Pharyngeal- Honey Teaspoon -- Pharyngeal -- Pharyngeal- Honey Cup -- Pharyngeal -- Pharyngeal- Nectar Teaspoon -- Pharyngeal -- Pharyngeal- Nectar Cup -- Pharyngeal -- Pharyngeal- Nectar Straw NT Pharyngeal -- Pharyngeal- Thin Teaspoon NT Pharyngeal -- Pharyngeal- Thin Cup WFL Pharyngeal -- Pharyngeal- Thin Straw NT Pharyngeal -- Pharyngeal- Puree WFL Pharyngeal -- Pharyngeal- Mechanical Soft Pharyngeal residue - valleculae Pharyngeal -- Pharyngeal- Regular Pharyngeal residue - valleculae Pharyngeal -- Pharyngeal- Multi-consistency -- Pharyngeal -- Pharyngeal- Pill WFL Pharyngeal -- Pharyngeal Comment --  CHL IP CERVICAL ESOPHAGEAL PHASE 04/12/2019 Cervical Esophageal Phase WFL Pudding Teaspoon -- Pudding Cup -- Honey Teaspoon -- Honey Cup -- Nectar Teaspoon -- Nectar Cup -- Nectar Straw -- Thin Teaspoon -- Thin Cup -- Thin Straw -- Puree -- Mechanical Soft -- Regular -- Multi-consistency -- Pill -- Cervical Esophageal Comment -- [name] 04/12/2019, 1:59 PM    Sonia Baller, MA, CCC-SLP Speech Therapy [facility] Acute Rehab Pager: [phone]          Dg Swallowing Func-speech Pathology  Result Date:  04/02/2019 Objective Swallowing Evaluation: Type of Study: MBS-Modified Barium Swallow Study  Patient Details Name: Jonathan [name] MRN: [id number] Date of Birth: 1971/08/20 Today's Date: 04/02/2019 Time: SLP Start Time (ACUTE ONLY): 1600 -SLP Stop Time (ACUTE ONLY): 1630 SLP Time Calculation (min) (ACUTE ONLY): 30 min Past Medical History: No past medical history on file. Past Surgical History:  The histories are not reviewed yet. Please review them in the "History" navigator section and refresh this [place]. HPI: Pt is a 48 y.o. male admitted on 02/23/19 with AMS, O2 sats in the ED were 50%.  Pt dx with COVID 19 and resultant ARDS.  He was intubated 4/21-5/11; reintubated and then trached on 5/12. His course is complicated by acute kidney injury requiring CVVHD.  PMH includes DM2, HTN.  Subjective: pt was upright in his chair, cooperative Assessment / Plan / Recommendation CHL IP CLINICAL IMPRESSIONS 04/02/2019 Clinical Impression Pt has a mild-moderate oropharyngeal dysphagia, with testing done without PMV in place as it has not been relocated s/p transfer to [facility]. Despite this, he demonstrates good airway protection with solids and nectar thick liquids. Thin liquids intermittently entered the airway in trace-mild amounts, but aspiration was silent. His swallow overall appeared mildly slowed and with reduced strenght. Lingual and vallecular residue was present, with pt performing a second swallow spontaneously to reduce residue in both locations. UES is tight with mild retrograde flow within the cervical esophagus that does not re-enter the pharynx during this study. Recommend starting with Dys 1 diet and nectar thick liquids. PMV not needed for intake, but SLP will f/u with pt to replace and continue to facilitate communication.  SLP Visit Diagnosis Dysphagia, oropharyngeal phase (R13.12) Attention and concentration deficit following -- Frontal lobe and executive function deficit following -- Impact on safety  and function Mild aspiration risk   [facility] IP TREATMENT RECOMMENDATION 04/02/2019 Treatment Recommendations Therapy as outlined in treatment plan below   Prognosis 04/02/2019 Prognosis for Safe Diet Advancement Good Barriers to Reach Goals -- Barriers/Prognosis Comment -- [facility] IP DIET RECOMMENDATION 04/02/2019 SLP  Diet Recommendations Dysphagia 1 (Puree) solids;Nectar thick liquid Liquid Administration via Cup;Straw Medication Administration Crushed with puree Compensations Slow rate;Small sips/bites;Multiple dry swallows after each bite/sip Postural Changes Seated upright at 90 degrees;Remain semi-upright after after feeds/meals (Comment)   [facility] IP OTHER RECOMMENDATIONS 04/02/2019 Recommended Consults -- Oral Care Recommendations Oral care BID Other Recommendations Order thickener from pharmacy;Prohibited food (jello, ice cream, thin soups);Remove water pitcher   [facility] IP FOLLOW UP RECOMMENDATIONS 04/02/2019 Follow up Recommendations Inpatient Rehab   [facility]L IP FREQUENCY AND DURATION 04/02/2019 Speech Therapy Frequency (ACUTE ONLY) min 2x/week Treatment Duration 2 weeks      CHL IP ORAL PHASE 04/02/2019 Oral Phase Impaired Oral - Pudding Teaspoon -- Oral - Pudding Cup -- Oral - Honey Teaspoon -- Oral - Honey Cup -- Oral - Nectar Teaspoon -- Oral - Nectar Cup -- Oral - Nectar Straw Weak lingual manipulation;Lingual/palatal residue Oral - Thin Teaspoon Weak lingual manipulation;Lingual/palatal residue Oral - Thin Cup -- Oral - Thin Straw Weak lingual manipulation;Lingual/palatal residue Oral - Puree Weak lingual manipulation;Lingual/palatal residue Oral - Mech Soft Weak lingual manipulation;Lingual/palatal residue Oral - Regular -- Oral - Multi-Consistency -- Oral - Pill -- Oral Phase - Comment --  CHL IP PHARYNGEAL PHASE 04/02/2019 Pharyngeal Phase Impaired Pharyngeal- Pudding Teaspoon -- Pharyngeal -- Pharyngeal- Pudding Cup -- Pharyngeal -- Pharyngeal- Honey Teaspoon -- Pharyngeal -- Pharyngeal- Honey Cup -- Pharyngeal --  Pharyngeal- Nectar Teaspoon -- Pharyngeal -- Pharyngeal- Nectar Cup -- Pharyngeal -- Pharyngeal- Nectar Straw Reduced anterior laryngeal mobility;Reduced laryngeal elevation;Reduced tongue base retraction;Reduced airway/laryngeal closure;Pharyngeal residue - valleculae Pharyngeal -- Pharyngeal- Thin Teaspoon Reduced anterior laryngeal mobility;Reduced laryngeal elevation;Reduced tongue base retraction;Reduced airway/laryngeal closure;Pharyngeal residue - valleculae Pharyngeal -- Pharyngeal- Thin Cup -- Pharyngeal -- Pharyngeal- Thin Straw Reduced anterior laryngeal mobility;Reduced laryngeal elevation;Reduced tongue base retraction;Reduced airway/laryngeal closure;Pharyngeal residue - valleculae;Penetration/Aspiration during swallow Pharyngeal Material enters airway, passes BELOW cords without attempt by patient to eject out (silent aspiration) Pharyngeal- Puree Reduced anterior laryngeal mobility;Reduced laryngeal elevation;Reduced tongue base retraction;Reduced airway/laryngeal closure;Pharyngeal residue - valleculae Pharyngeal -- Pharyngeal- Mechanical Soft Reduced anterior laryngeal mobility;Reduced laryngeal elevation;Reduced tongue base retraction;Reduced airway/laryngeal closure;Pharyngeal residue - valleculae Pharyngeal -- Pharyngeal- Regular -- Pharyngeal -- Pharyngeal- Multi-consistency -- Pharyngeal -- Pharyngeal- Pill -- Pharyngeal -- Pharyngeal Comment --  CHL IP CERVICAL ESOPHAGEAL PHASE 04/02/2019 Cervical Esophageal Phase Impaired Pudding Teaspoon -- Pudding Cup -- Honey Teaspoon -- Honey Cup -- Nectar Teaspoon -- Nectar Cup -- Nectar Straw Reduced cricopharyngeal relaxation;Esophageal backflow into cervical esophagus Thin Teaspoon Reduced cricopharyngeal relaxation;Esophageal backflow into cervical esophagus Thin Cup -- Thin Straw Reduced cricopharyngeal relaxation;Esophageal backflow into cervical esophagus Puree Reduced cricopharyngeal relaxation Mechanical Soft Reduced cricopharyngeal relaxation  Regular -- Multi-consistency -- Pill -- Cervical Esophageal Comment -- Venita Sheffield Nix 04/02/2019, 4:43 PM  Pollyann Glen, M.A. CCC-SLP Acute Rehabilitation Services Pager [phone] Office [phone]             Vas [place] Lower Extremity Venous (dvt)  Result Date: 03/22/2019  Lower Venous Study Indications: SOB, and Covid-19.  Limitations: Ventilator, boots. Comparison Study: No prior study on file for comparison Performing Technologist: Sharion Dove RVS  Examination Guidelines: A complete evaluation includes B-mode imaging, spectral Doppler, color Doppler, and power Doppler as needed of all accessible portions of each vessel. Bilateral testing is considered an integral part of a complete examination. Limited examinations for reoccurring indications may be performed as noted.  +---------+---------------+---------+-----------+----------+-------------------+  RIGHT     Compressibility Phasicity Spontaneity Properties Summary              +---------+---------------+---------+-----------+----------+-------------------+  CFV       Full            Yes       Yes                                         +---------+---------------+---------+-----------+----------+-------------------+  SFJ       Full                                                                  +---------+---------------+---------+-----------+----------+-------------------+  FV Prox   Full                                                                  +---------+---------------+---------+-----------+----------+-------------------+  FV Mid    Full                                                                  +---------+---------------+---------+-----------+----------+-------------------+  FV Distal Full                                                                  +---------+---------------+---------+-----------+----------+-------------------+  PFV       Full                                                                   +---------+---------------+---------+-----------+----------+-------------------+  POP       Full            Yes       Yes                                         +---------+---------------+---------+-----------+----------+-------------------+  PTV                                                        Not all segments  visualized           +---------+---------------+---------+-----------+----------+-------------------+  PERO                                                       Not visualized       +---------+---------------+---------+-----------+----------+-------------------+   +---------+---------------+---------+-----------+----------+-------------------+  LEFT      Compressibility Phasicity Spontaneity Properties Summary              +---------+---------------+---------+-----------+----------+-------------------+  CFV       Full                                                                  +---------+---------------+---------+-----------+----------+-------------------+  SFJ       Full                                                                  +---------+---------------+---------+-----------+----------+-------------------+  FV Prox   Full                                                                  +---------+---------------+---------+-----------+----------+-------------------+  FV Mid    Full                                                                  +---------+---------------+---------+-----------+----------+-------------------+  FV Distal Full                                                                  +---------+---------------+---------+-----------+----------+-------------------+  PFV       Full                                                                  +---------+---------------+---------+-----------+----------+-------------------+  POP       Full                                                                   +---------+---------------+---------+-----------+----------+-------------------+  PTV       Full                                             Not all segments                                                                 visualized           +---------+---------------+---------+-----------+----------+-------------------+  PERO                                                       Not visualized       +---------+---------------+---------+-----------+----------+-------------------+     Summary: Right: There is no evidence of deep vein thrombosis in the lower extremity. However, portions of this examination were limited- see technologist comments above. Left: There is no evidence of deep vein thrombosis in the lower extremity. However, portions of this examination were limited- see technologist comments above.  *See table(s) above for measurements and observations. Electronically signed by Monica Martinez MD on 03/22/2019 at 6:11:19 PM.    Final     Microbiology: No results found for this or any previous visit (from the past 240 hour(s)).   Labs: Basic Metabolic Panel: Recent Labs  Lab 04/13/19 0449 04/14/19 0546 04/15/19 0517 04/17/19 0854  NA 136 138 137  --   K 3.4* 3.4* 3.7  --   CL 103 105 105  --   CO2 22 23 21*  --   GLUCOSE 111* 105* 107*  --   BUN 38* 33* 38*  --   CREATININE 1.54* 1.25* 1.47* 1.05  CALCIUM 9.7 9.9 9.6  --   PHOS 3.6 4.1  --   --    Liver Function Tests: Recent Labs  Lab 04/13/19 0449 04/14/19 0546  AST 26 21  ALT 30 29  ALKPHOS 121 120  BILITOT 0.6 0.3  PROT 6.5 6.7  ALBUMIN 2.6* 2.7*   No results for input(s): LIPASE, AMYLASE in the last 168 hours. No results for input(s): AMMONIA in the last 168 hours. CBC: Recent Labs  Lab 04/13/19 0449 04/14/19 0546 04/15/19 0517 04/19/19 0609  WBC 10.1 10.0 9.9 10.0  HGB 7.5* 7.7* 7.8* 8.8*  HCT 24.0* 24.8* 25.6* 28.4*  MCV 93.8 94.7 95.2 95.0  PLT 334 357 395 429*   Cardiac Enzymes: No results for  input(s): CKTOTAL, CKMB, CKMBINDEX, TROPONINI in the last 168 hours. BNP: BNP (last 3 results) No results for input(s): BNP in the last 8760 hours.  ProBNP (last 3 results) No results for input(s): PROBNP in the last 8760 hours.  CBG: Recent Labs  Lab 04/18/19 1134 04/18/19 1610 04/18/19 2112 04/19/19 0810 04/19/19 1141  GLUCAP 156* 144* 226* 150* 181*       Signed:  Kayleen Memos, MD [facility] 04/19/2019, 2:56 PM

## 2019-04-19 NOTE — Progress Notes (Signed)
Assisted tele visit to patient with wife.  Jonathan [name] Jonathan Dvonte Gatliff, RN   

## 2019-04-19 NOTE — H&P (Signed)
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  Presented 02/23/2019 with increasing shortness of breath requiring intubation for airway protection follow-up critical care medicine for severe respiratory failure requiring ventilatory support related to SIRS COVID-19 infection.  Patient developed severe ATN requiring CRRT with prolonged respiratory failure extubated later underwent tracheostomy 03/16/2019 per Dr. Nelda Marseille as well as placement of permacath 03/17/2019 for renal failure requiring intermittent dialysis.  Renal ultrasound showed no hydronephrosis.  On 03/18/2019 patient with worsening hypoxemia and placed on intravenous heparin for suspect pulmonary emboli.  CT angiogram cannot be obtained at that time due to severe AKI.  Transitioned to Eliquis but was discontinued due to drop in hemoglobin required 2 units packed red blood cells and currently remains only on Lovenox with recommendations to continue for 3 months.  Lower extremity Dopplers were negative for DVT. He was transferred 03/30/2019 from [place] to [facility].  Repeat COVID testing negative x2 on 04/02/2019 and 04/03/2019 patient's. Diet has been advanced to regular after initially receiving nasogastric tube feeds  Currently with a #4 cuffless trach PMV as directed plan for capping per pulmonary services.  During patient's hospital course developed full-thickness related pressure injuries to bilateral cheeks while recovering from [place].  Wound care nurse follow-up for skin care.  Patient's renal function is stabilized latest creatinine 1.47 and no longer requiring dialysis.  Therapy evaluations completed and patient was admitted for a comprehensive rehab program.   Review of Systems  Unable to perform ROS: Language  Constitutional: Negative for  fever.  HENT: Negative for hearing loss.   Eyes: Negative for blurred vision.  Respiratory: Positive for cough and shortness of breath.   Cardiovascular: Negative for chest pain.  Gastrointestinal: Negative for nausea and vomiting.  Genitourinary: Negative for dysuria.  Musculoskeletal: Negative for myalgias.  Skin: Negative for rash.  Neurological: Positive for weakness. Negative for dizziness.  Psychiatric/Behavioral: Negative for depression.    No past medical history on file.  The histories are not reviewed yet. Please review them in the "History" navigator section and refresh this [place]. No family history on file. Social History:  reports that he has never smoked. He has never used smokeless tobacco. He reports previous alcohol use. He reports previous drug use. Allergies: Not on File No medications prior to admission.     Drug Regimen Review  Drug regimen was reviewed and remains appropriate with no significant issues identified   Home: Home Living Family/patient expects to be discharged to:: Inpatient rehab Additional Comments: From [place]; works on farm through [employer] History: Prior Function Level of Independence: Independent Comments: migrant farm Insurance underwriter from [place]   Functional Status:  Mobility: Bed Mobility Overal bed mobility: Needs Assistance Bed Mobility: Supine to Sit Supine to sit: HOB elevated, Supervision Sit to supine: Min assist, HOB elevated General bed mobility comments: pt received OOB in recliner Transfers Overall transfer level: Needs assistance Equipment used: Art gallery manager via Lift Equipment: Stedy Transfers: Sit to/from [employer] to Stand: Min assist, Min guard, +2 safety/equipment Stand pivot transfers: Min assist, +2 physical assistance Squat pivot transfers: +2 physical assistance, +2 safety/equipment, Max assist General transfer comment: Intermittent verbal cues for hand placement. Assist to steady.  Ambulation/Gait Ambulation/Gait assistance: Min guard, +2 safety/equipment Gait Distance (Feet): 140 Feet Assistive device: 4-wheeled walker  Gait Pattern/deviations: Step-through pattern, Decreased stride length, Narrow base of support General Gait Details: Assist for safety and balance. Pt on 55% O2 with SpO2 to 85% with amb. Standing rest break with SpO2 returning to 90%. On return to room SpO2 86%. Returned to >90% after 1 minute.  HR to 130's with amb Gait velocity: decr Gait velocity interpretation: <1.31 ft/sec, indicative of household ambulator     ADL: ADL Overall ADL's : Needs assistance/impaired Eating/Feeding: Set up, Supervision/ safety Eating/Feeding Details (indicate cue type and reason): provided supervision for pt to spoon/drink tea (tea at appropriate thickness for current diet)  Grooming: Min guard, Minimal assistance, Standing, Wash/dry face Grooming Details (indicate cue type and reason): for standing balance Upper Body Bathing: Maximal assistance, Bed level Lower Body Bathing: Total assistance, Bed level Upper Body Dressing : Total assistance Lower Body Dressing: Minimal assistance, Moderate assistance, +2 for safety/equipment, Sit to/from stand Lower Body Dressing Details (indicate cue type and reason): pt bringing LEs up to adjust socks seated EOB, increased effort but no physical assist required Functional mobility during ADLs: Minimal assistance, +2 for safety/equipment, Rolling walker General ADL Comments: pt continues to demonstrate improvements with activity tolerance   Cognition: Cognition Overall Cognitive Status: Difficult to assess Orientation Level: Oriented X4 Cognition Arousal/Alertness: Awake/alert Behavior During Therapy: WFL for tasks assessed/performed Overall Cognitive Status: Difficult to assess General Comments: following commands well, pt continues to have increased awareness asking about his HR today Difficult to assess due to:  Non-English speaking   Physical Exam: Blood pressure 131/86, pulse 93, temperature 98.6 F (37 C), temperature source Oral, resp. rate (!) 32, height 5\' 5"  (1.651 m), weight 66.1 kg, SpO2 96 %. Physical Exam  Constitutional: He is oriented to person, place, and time. He appears well-developed and well-nourished. No distress.  HENT:  Head: Normocephalic.  Eyes: Pupils are equal, round, and reactive to light. EOM are normal.  Neck: Normal range of motion. No thyromegaly present.  #4  Cuffless trach in place with red plug  Cardiovascular: Normal rate and regular rhythm.  No murmur heard. Respiratory: Effort normal. He has no wheezes. He has no rales.  A few scattered rhonchi.   GI: Soft. He exhibits no distension. There is no abdominal tenderness.  Musculoskeletal: Normal range of motion.        General: No edema.  Neurological: He is alert and oriented to person, place, and time.  Patient is alert sitting up in bed.  Follows some demonstrated commands, communicates in spanish without problems.  Makes good eye contact with examiner. UE 4/5 prox to distal. LE: 3/5 HF, KE and 4/5 ADF/PF. Senses pain in all 4's.   Skin: He is not diaphoretic.  A few scattered abrasions around face  Psychiatric: He has a normal mood and affect. His behavior is normal.      Lab Results Last 48 Hours        Results for orders placed or performed during the hospital encounter of 02/23/19 (from the past 48 hour(s))  Glucose, capillary     Status: Abnormal    Collection Time: 04/17/19 11:36 AM  Result Value Ref Range    Glucose-Capillary 192 (H) 70 - 99 mg/dL  Glucose, capillary     Status: Abnormal    Collection Time: 04/17/19  5:02 PM  Result Value Ref Range    Glucose-Capillary 145 (H) 70 - 99 mg/dL  Glucose, capillary     Status: Abnormal    Collection Time: 04/17/19  9:07 PM  Result Value Ref Range    Glucose-Capillary 124 (H) 70 - 99 mg/dL  Glucose, capillary     Status: Abnormal    Collection  Time: 04/18/19  7:30 AM  Result Value Ref Range    Glucose-Capillary 132 (H) 70 - 99 mg/dL  Glucose, capillary     Status: Abnormal    Collection Time: 04/18/19 11:34 AM  Result Value Ref Range    Glucose-Capillary 156 (H) 70 - 99 mg/dL  Glucose, capillary     Status: Abnormal    Collection Time: 04/18/19  4:10 PM  Result Value Ref Range    Glucose-Capillary 144 (H) 70 - 99 mg/dL  Glucose, capillary     Status: Abnormal    Collection Time: 04/18/19  9:12 PM  Result Value Ref Range    Glucose-Capillary 226 (H) 70 - 99 mg/dL  CBC     Status: Abnormal    Collection Time: 04/19/19  6:09 AM  Result Value Ref Range    WBC 10.0 4.0 - 10.5 K/uL    RBC 2.99 (L) 4.22 - 5.81 MIL/uL    Hemoglobin 8.8 (L) 13.0 - 17.0 g/dL    HCT 28.4 (L) 39.0 - 52.0 %    MCV 95.0 80.0 - 100.0 fL    MCH 29.4 26.0 - 34.0 pg    MCHC 31.0 30.0 - 36.0 g/dL    RDW 14.8 11.5 - 15.5 %    Platelets 429 (H) 150 - 400 K/uL    nRBC 0.0 0.0 - 0.2 %      Comment: Performed at [facility], [place]. [address], [place], [place] [address]  Glucose, capillary     Status: Abnormal    Collection Time: 04/19/19  8:10 AM  Result Value Ref Range    Glucose-Capillary 150 (H) 70 - 99 mg/dL     Imaging Results (Last 48 hours)  No results found.           Medical Problem List and Plan: 1.  Debility secondary to ARDS due to SARS COVID-19 infection.  Repeat COVID test x2-             -admit to inpatient rehab 2.  Antithrombotics: -DVT/anticoagulation: Subcutaneous Lovenox 60 mg every 12 hours for suspect pulmonary emboli.  CT angiogram of chest not completed due to AKI.  Lower extremity Dopplers negative             -antiplatelet therapy: N/A 3. Pain Management: Oxycodone as needed 4. Mood: Klonopin 0.25 mg daily             -antipsychotic agents: N/A 5. Neuropsych: This patient is capable of making decisions on his own behalf. 6. Skin/Wound Care: Routine skin care 7. Fluids/Electrolytes/Nutrition: Routine in and outs  with follow-up chemistries 8.  AKI.  Status post permacath 03/17/2019.  Latest creatinine 1.47.  Patient did initially receive intermittent dialysis.              -serial monitoring of renal function while on rehab 9.  Acute blood loss anemia.  Patient has been transfused during hospital course.  Follow-up CBC.  Continue iron supplement 10.  Tracheostomy 03/16/2019.  Currently with #4 cuffless trach.  Capped today by CCM. If tolerates well, decannulate over the next 1-2 days. 11.  Intermittent sinus tachycardia.  Continue Lopressor 25 mg twice daily 12.  Diabetes mellitus.  Hemoglobin A1c 5.5.  Lantus insulin 12 units twice daily.  Monitor closely with increased mobility, adjust regimen as needed  Post Admission Physician Evaluation: 1. Functional deficits secondary  to debility. 2. Patient is admitted to receive collaborative, interdisciplinary care between the physiatrist, rehab nursing staff, and therapy team. 3. Patient's level of medical complexity and substantial therapy needs in context of that medical necessity cannot be provided at a lesser intensity of care such as a SNF. 4. Patient has experienced substantial functional loss from his/her baseline which was documented above under the "Functional History" and "Functional Status" headings.  Judging by the patient's diagnosis, physical exam, and functional history, the patient has potential for functional progress which will result in measurable gains while on inpatient rehab.  These gains will be of substantial and practical use upon discharge  in facilitating mobility and self-care at the household level. 5. Physiatrist will provide 24 hour management of medical needs as well as oversight of the therapy plan/treatment and provide guidance as appropriate regarding the interaction of the two. 6. The Preadmission Screening has been reviewed and patient status is unchanged unless otherwise stated above. 7. 24 hour rehab nursing will assist  with bladder management, bowel management, safety, skin/wound care, disease management, medication administration, pain management and patient education  and help integrate therapy concepts, techniques,education, etc. 8. PT will assess and treat for/with: Lower extremity strength, range of motion, stamina, balance, functional mobility, safety, adaptive techniques and equipment.   Goals are: mod I. 9. OT will assess and treat for/with: ADL's, functional mobility, safety, upper extremity strength, adaptive techniques and equipment.   Goals are: mod I. Therapy may proceed with showering this patient. 10. SLP will assess and treat for/with: speech, communication, swallowing.  Goals are: mod I. 11. Case Management and Social Worker will assess and treat for psychological issues and discharge planning. 12. Team conference will be held weekly to assess progress toward goals and to determine barriers to discharge. 13. Patient will receive at least 3 hours of therapy per day at least 5 days per week. 14. ELOS: 14-20 days       15. Prognosis:  excellent   I have personally performed a face to face diagnostic evaluation of this patient and formulated the key components of the plan.  Additionally, I have personally reviewed laboratory data, imaging studies, as well as relevant notes and concur with the physician assistant's documentation above.  Meredith Staggers, MD, FAAPMR     Lavon Paganini [place], PA-C 04/19/2019

## 2019-04-19 NOTE — PMR Pre-admission (Signed)
PMR Admission Coordinator Pre-Admission Assessment  Patient: Jonathan [name] is an 48 y.o., male MRN: [id number] DOB: September 02, 1971 Height: '5\' 5"'  (165.1 cm) Weight: 66.1 kg  Insurance Information HMO:     PPO:      PCP:      IPA:      80/20:      OTHER:  PRIMARY: uninsured       Donald Prose is [employer] Rep for the [employer] I have kept her updated on pt's progress and plan for him to return to [employer] at d/c. Patient will unlikely return to work and [name] is aware. His Visa allows him to remain until 09/2019. Farm union rep is discussing with financial counselor what benefits he may have with workers compensation and patient is asking about any compensation they may provide him as he was working when he got sick.  Donald Prose phone [phone], [employer] Representative  Medicaid Application Date:       Case Manager:  Disability Application Date:       Case Worker:   The Data Collection Information Summary for patients in Inpatient Rehabilitation Facilities with attached Privacy Act [place] Records was provided and verbally reviewed with: N/A  Emergency Contact Information Contact Information    Name Relation Home Work [place], [place] Spouse [phone]     [place] Other   [place], [place] Daughter [phone]        Current Medical History  Patient Admitting Diagnosis: Debility, post COVID  History of Present Illness: Jonathan [name] is a 48 year old Spanish-speaker 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  Presented 02/23/2019 with increasing shortness of breath requiring intubation for airway protection follow-up critical care medicine for severe respiratory failure requiring ventilatory support related to SIRS COVID-19 infection.  Patient developed severe ATN requiring CVVHD with prolonged respiratory failure extubated later underwent tracheostomy 03/16/2019 per Dr. Nelda Marseille as well as placement of  permacath 03/17/2019 for renal failure requiring CVVHD.Marland Kitchen  Renal ultrasound showed no hydronephrosis.  On 03/18/2019 patient with worsening hypoxemia and placed on intravenous heparin for suspect pulmonary emboli.  CT angiogram cannot be obtained at that time due to severe AKI.  Transitioned to Eliquis but was discontinued due to drop in hemoglobin required 2 units packed red blood cells and currently remains only on Lovenox with recommendations to continue for 3 months. Off Ventilator since 5/21. Lower extremity Dopplers were negative for DVT. He was transferred 03/30/2019 from [place] to [place] to receive hemodialysis. Off dialysis since 5/30 and catheter removed. Patient's diet has been advanced to regular after initially receiving nasogastric tube feeds.  Repeat COVID tested negative x2 on 5/29 on 04/03/2019.  Currently with a #4 cuff less trach PMSV as directed plan for capping per pulmonary services on 6/15. Will plan cap trach, if no desaturation would consider decannulation in the next 1 to 2 days per PCCM.   During patient's hospital course developed full-thickness related pressure injuries to bilateral cheeks while recovering from [place].  Wound care nurse follow-up for skin care.  Patient's renal function is stabilized latest creatinine 1.47 and no longer requiring dialysis as of 5/30.   Patient[facility] medical record from [facility]  has been reviewed by the rehabilitation admission coordinator and physician.  Past Medical History  Diabetes T2DM  Family History   family history is not on file.  Prior Rehab/Hospitalizations Has the patient had prior rehab or  hospitalizations prior to admission? Yes  Has the patient had major surgery during 100 days prior to admission? Yes   Current Medications  Current Facility-Administered Medications:    0.9 %  sodium chloride infusion, , Intravenous, PRN, Charlynne Cousins, MD, Stopped at 03/21/19 2128    acetaminophen (TYLENOL) tablet 650 mg, 650 mg, Oral, Q6H PRN, Florencia Reasons, MD, 650 mg at 04/05/19 2346   chlorhexidine (PERIDEX) 0.12 % solution 15 mL, 15 mL, Mouth Rinse, BID, Collene Gobble, MD, 15 mL at 04/19/19 0909   Chlorhexidine Gluconate Cloth 2 % PADS 6 each, 6 each, Topical, Q0600, Corliss Parish, MD, 6 each at 04/19/19 4210   clonazePAM (KLONOPIN) disintegrating tablet 0.25 mg, 0.25 mg, Oral, Daily, Florencia Reasons, MD, 0.25 mg at 04/19/19 0910   enoxaparin (LOVENOX) injection 60 mg, 60 mg, Subcutaneous, Q12H, Georgette Shell, MD, 60 mg at 04/19/19 3128   feeding supplement (ENSURE ENLIVE) (ENSURE ENLIVE) liquid 237 mL, 237 mL, Oral, BID BM, Georgette Shell, MD, 237 mL at 04/19/19 1222   feeding supplement (PRO-STAT SUGAR FREE 64) liquid 30 mL, 30 mL, Oral, TID, Florencia Reasons, MD, 30 mL at 04/19/19 0909   ferrous sulfate tablet 325 mg, 325 mg, Oral, Q breakfast, Hall, Carole N, DO, 325 mg at 04/19/19 1188   glycopyrrolate (ROBINUL) injection 0.1 mg, 0.1 mg, Intravenous, Q4H PRN, Rush Farmer, MD, 0.1 mg at 03/16/19 0415   guaiFENesin (ROBITUSSIN) 100 MG/5ML solution 100 mg, 5 mL, Oral, BID, Florencia Reasons, MD, 100 mg at 04/19/19 0911   insulin aspart (novoLOG) injection 0-9 Units, 0-9 Units, Subcutaneous, TID WC, Georgette Shell, MD, 2 Units at 04/19/19 1230   insulin aspart (novoLOG) injection 6 Units, 6 Units, Subcutaneous, TID WC, Georgette Shell, MD, 6 Units at 04/19/19 1231   insulin glargine (LANTUS) injection 12 Units, 12 Units, Subcutaneous, BID, Georgette Shell, MD, 12 Units at 04/19/19 0906   ipratropium (ATROVENT HFA) inhaler 2 puff, 2 puff, Inhalation, TID, Charlynne Cousins, MD, 2 puff at 04/19/19 0740   levalbuterol [facility] HFA) inhaler 2 puff, 2 puff, Inhalation, Q6H PRN, Irene Pap N, DO, 2 puff at 04/17/19 2020   levalbuterol [facility] HFA) inhaler 2 puff, 2 puff, Inhalation, TID, Charlynne Cousins, MD, 2 puff at 04/19/19 0740   lip  balm (CARMEX) ointment, , Topical, PRN, Collene Gobble, MD   MEDLINE mouth rinse, 15 mL, Mouth Rinse, q12n4p, Byrum, Rose Fillers, MD, 15 mL at 04/18/19 1700   metoprolol tartrate (LOPRESSOR) injection 10 mg, 10 mg, Intravenous, Q6H PRN, Charlynne Cousins, MD, 5 mg at 04/19/19 0131   metoprolol tartrate (LOPRESSOR) tablet 25 mg, 25 mg, Oral, BID, Hall, Carole N, DO, 25 mg at 04/19/19 0910   morphine 2 MG/ML injection 1-2 mg, 1-2 mg, Intravenous, Q4H PRN, Bodenheimer, Charles A, NP   mupirocin cream (BACTROBAN) 2 %, , Topical, BID, Domenic Polite, MD   ondansetron [facility]) injection 4 mg, 4 mg, Intravenous, Q6H PRN, Allie Bossier, MD, 4 mg at 04/05/19 2007   oxyCODONE (Oxy IR/ROXICODONE) immediate release tablet 5 mg, 5 mg, Oral, BID PRN, Florencia Reasons, MD   pantoprazole (PROTONIX) EC tablet 40 mg, 40 mg, Oral, BID, Florencia Reasons, MD, 40 mg at 04/19/19 6773   Resource ThickenUp Clear, , Oral, PRN, Florencia Reasons, MD   senna-docusate (Senokot-S) tablet 1 tablet, 1 tablet, Oral, BID, Florencia Reasons, MD, 1 tablet at 04/19/19 0909   sodium chloride flush (NS) 0.9 % injection 10-40 mL, 10-40  mL, Intracatheter, Q12H, Charlynne Cousins, MD, 10 mL at 04/18/19 2247  Patients Current Diet:  Diet Order            Diet regular Room service appropriate? Yes with Assist; Fluid consistency: Thin  Diet effective now              Precautions / Restrictions Precautions Precautions: Fall Precaution Comments: trach now with PMSV Other Brace: Prevalon =- no longer needs Restrictions Weight Bearing Restrictions: No   Has the patient had 2 or more falls or a fall with injury in the past year? 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 Independent  Indoor Mobility: Did the patient need assistance with  walking from room to room (with or without device)? Independent  Stairs: Did the patient need assistance with internal or external stairs (with or without device)? Independent  Functional Cognition: Did the patient need help planning regular tasks such as shopping or remembering to take medications? Independent  Home Assistive Devices / Equipment Home Assistive Devices/Equipment: None  Prior Device Use: Indicate devices/aids used by the patient prior to current illness, exacerbation or injury? None of the above  Current Functional Level Cognition  Overall Cognitive Status: Difficult to assess Difficult to assess due to: Non-English speaking Orientation Level: Oriented X4 General Comments: following commands well, pt continues to have increased awareness asking about his HR today    Extremity Assessment (includes Sensation/Coordination)  Upper Extremity Assessment: Generalized weakness RUE Deficits / Details: painful R shoulder ROM, able to tolerate grossly to 90* with faciliation at scapula to ensure proper gliding RUE Coordination: decreased gross motor, decreased fine motor LUE Deficits / Details: poor scapular humeral rhythm; improved from yesterday. Able to achieve @ 120 FF AAROM continues to improve; stronger than RUE LUE Coordination: decreased fine motor, decreased gross motor  Lower Extremity Assessment: Defer to PT evaluation RLE Deficits / Details: Abl eto lift SLR off bed LLE Deficits / Details: Able to lift SLR off bed    ADLs  Overall ADL's : Needs assistance/impaired Eating/Feeding: Set up, Supervision/ safety Eating/Feeding Details (indicate cue type and reason): provided supervision for pt to spoon/drink tea (tea at appropriate thickness for current diet)  Grooming: Min guard, Minimal assistance, Standing, Wash/dry face Grooming Details (indicate cue type and reason): for standing balance Upper Body Bathing: Maximal assistance, Bed level Lower Body Bathing: Total  assistance, Bed level Upper Body Dressing : Total assistance Lower Body Dressing: Minimal assistance, Moderate assistance, +2 for safety/equipment, Sit to/from stand Lower Body Dressing Details (indicate cue type and reason): pt bringing LEs up to adjust socks seated EOB, increased effort but no physical assist required Functional mobility during ADLs: Minimal assistance, +2 for safety/equipment, Rolling walker General ADL Comments: pt continues to demonstrate improvements with activity tolerance    Mobility  Overal bed mobility: Needs Assistance Bed Mobility: Supine to Sit Supine to sit: HOB elevated, Supervision Sit to supine: Min assist, HOB elevated General bed mobility comments: pt received OOB in recliner    Transfers  Overall transfer level: Needs assistance Equipment used: 4-wheeled walker Transfer via Lift Equipment: Stedy Transfers: Sit to/from [employer] to Stand: Min assist, Min guard, +2 safety/equipment Stand pivot transfers: Min assist, +2 physical assistance Squat pivot transfers: +2 physical assistance, +2 safety/equipment, Max assist General transfer comment:  Intermittent verbal cues for hand placement. Assist to steady.    Ambulation / Gait / Stairs / Wheelchair Mobility  Ambulation/Gait Ambulation/Gait assistance: Min guard, +2 safety/equipment Gait Distance (Feet): 140 Feet Assistive device: 4-wheeled walker Gait Pattern/deviations: Step-through pattern, Decreased stride length, Narrow base of support General Gait Details: Assist for safety and balance. Pt on 55% O2 with SpO2 to 85% with amb. Standing rest break with SpO2 returning to 90%. On return to room SpO2 86%. Returned to >90% after 1 minute.  HR to 130's with amb Gait velocity: decr Gait velocity interpretation: <1.31 ft/sec, indicative of household ambulator    Posture / Balance Dynamic Sitting Balance Sitting balance - Comments: posterior lean requiring min assist as he does LE exercises.    Balance Overall balance assessment: Needs assistance Sitting-balance support: No upper extremity supported, Feet supported Sitting balance-Leahy Scale: Fair Sitting balance - Comments: posterior lean requiring min assist as he does LE exercises.   Standing balance support: Single extremity supported Standing balance-Leahy Scale: Fair Standing balance comment: UE assist    Special needs/care consideration BiPAP/CPAP  N/a CPM  N/a Continuous Drip IV  N/a Dialysis  Off Hemodialysis since 5/30 Life Vest  N/a Oxygen  35 % trach collar morning of 6/15 with PMSV worn during all waking hours; PCCM to cap trach today per their orders Special Bed  N/a Trach Size #4 cuff less 5/26 Wound Vac n/a Skin  Signed         Show:Clear all '[x]' Manual'[x]' Template'[]' Copied  Added by: '[x]' Jeannie Fend, RN  '[]' Hover for details WOC Nurse wound follow up Wound type:Full thickness device related pressure injuries to bilateral cheeks from proning with ETT while recovering from COVID-19.  No more  Measurement: LEft cheek remains necrotic, adherent today. Scarring from new epithelialization.  0.5 cm round eschar Right cheek eschar has sloughed away.  Pink new epithelialized tissue present.  Intact and healing.    Wound bed:see above Drainage (amount, consistency, odor) none Peri wound: new epithelial tissue Dressing procedure/placement/frequency: Cleanse bilateral cheeks daily with soap and water and pat dry.  Scant amount of mupirocin ointment.  Open to air.  Will not follow at this time.  Please re-consult if needed.  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) Available Help at Discharge: Available PRN/intermittently(other workers intermittently) Type of Home: [place]: One [place]: Ramped entrance [place] Shower/Tub: [place] unit, Architectural technologist: [place] Accessibility: Yes How Accessible: Accessible via [place]: No Additional Comments: From [place]; works on farm through [employer] for Discharge Living Setting: Other (Comment)(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 worker, employer [employer], [place] [employer]) Type of Home at Discharge: [place]: One level Discharge Home Access: [place] entrance Discharge Bathroom Shower/Tub: [place] unit, [place] Discharge Bathroom Toilet: Standard Discharge Bathroom Accessibility: Yes How Accessible: Accessible via walker Does the patient have any problems obtaining your medications?: Yes (Describe)  Social/Family/Support Systems Patient Roles: Spouse, Parent(Migrant worker) Sport and exercise psychologist  Information: [name], employer; Donald Prose union rep for [employer] union, daughter, Chrissie Noa, in [place] is main contact Anticipated Caregiver: other workers intermittently Anticipated Ambulance person Information: [name], employer Ability/Limitations of Caregiver: intermittent Caregiver Availability: Intermittent Discharge Plan Discussed with Primary Caregiver: Yes Is Caregiver In Agreement with Plan?: Yes Does Caregiver/Family have Issues with Lodging/Transportation while Pt is in Rehab?: No   I have spoken with [name] who is agreeable for patient to return to farm where she will provide housing. [place], Donald Prose ( in [place]) is working with Development worker, community on his workers compensation/ Patient asking about pay  and I have made union rep aware.   Goals/Additional Needs Patient/Family Goal for Rehab: Mod I with PT, OT, and SLP Expected length of stay: ELOS 14 to 20 days Cultural Considerations: Spanish Speaker form [place] Equipment Needs: To be capped per Dr. Nelda Marseille trach on day of admission Special Service Needs: Spanish interpreter to be arrnaged for therapy sessions Additional Information: Donald Prose, farm union rep Pt/Family Agrees to Admission and willing to participate: Yes Program Orientation Provided & Reviewed with Pt/Caregiver Including Roles  & Responsibilities: Yes  Decrease burden of Care through IP rehab admission: n/a  Possible need for SNF placement upon discharge:  Not anticipated  Patient Condition: I have reviewed medical records from [facility] , spoken with  patient. I met with patient at the bedside for inpatient rehabilitation assessment.  Patient will benefit from ongoing PT, OT and SLP, can actively participate in 3 hours of therapy a day 5 days of the week, and can make measurable gains during the admission.  Patient will also benefit from the coordinated team approach during an Inpatient Acute Rehabilitation admission.  The patient will receive intensive therapy as well as Rehabilitation physician, nursing, social worker, and care management interventions.  Due to bladder management, bowel management, safety, skin/wound care, disease management, medication administration, pain management and patient education the patient requires 24 hour a day rehabilitation nursing.  The patient is currently *min to mod assist with mobility and basic ADLs.  Discharge setting and therapy post discharge at home with home health is anticipated.  Patient has agreed to participate in the Acute Inpatient Rehabilitation Program and will admit today.  Preadmission Screen Completed By:  Annamary Rummage MSN 04/19/2019 12:48  PM ______________________________________________________________________   Discussed status with Dr. Naaman Plummer  on  04/19/2019 at  1315 and received approval for admission today.  Admission Coordinator:  Cleatrice Burke, RN MSN, time  6294 Date  04/19/2019   Assessment/Plan: Diagnosis: debility after respiratory failure/ARDS 1. Does the need for close, 24 hr/day Medical supervision in concert with the patient's rehab needs make it unreasonable for this patient to be served in a less intensive setting? Yes 2. Co-Morbidities requiring supervision/potential complications: DM2, trach 3. Due to bladder management, bowel management, safety, skin/wound care, disease management, medication administration, pain management and patient education, does the patient require 24 hr/day rehab nursing? Yes 4. Does the patient require coordinated care of a physician, rehab nurse, PT (1-2 hrs/day, 5 days/week), OT (1-2 hrs/day, 5 days/week) and SLP (1-2 hrs/day, 5 days/week) to address physical and functional deficits in the context of the above medical diagnosis(es)? Yes Addressing deficits in the following areas: balance, endurance, locomotion, strength, transferring, bowel/bladder control, bathing, dressing, feeding, grooming, toileting, speech and psychosocial support 5. Can the patient actively participate in an intensive therapy program of at least 3 hrs of therapy 5 days a week? Yes 6. The potential  for patient to make measurable gains while on inpatient rehab is excellent 7. Anticipated functional outcomes upon discharge from inpatients are: modified independent PT, modified independent OT, modified independent SLP 8. Estimated rehab length of stay to reach the above functional goals is: 14-20 days 9. Anticipated D/C setting: Home 10. Anticipated post D/C treatments: [place] therapy 11. Overall Rehab/Functional Prognosis: excellent  MD Signature: Meredith Staggers, MD, [place] [facility] 04/19/2019

## 2019-04-19 NOTE — Progress Notes (Signed)
Meredith Staggers, MD    Meredith Staggers, MD  Physician  Physical Medicine and Rehabilitation     PMR Pre-admission  Signed     Date of Service:  04/19/2019 12:48 PM        Related encounter: ED to Hosp-Admission (Current) from 02/23/2019 in [place] [facility]             Signed                    Expand widget buttonCollapse widget button    Show:Clear all   ManualTemplateCopied  Added by:     Cristina Gong, RN  Meredith Staggers, MD   Hover for detailscustomization button                                                                                                                                                                        PMR Admission Coordinator Pre-Admission Assessment     Patient: Jonathan [name] is an 48 y.o., male  MRN: [id number]  DOB: September 16, 1971  Height: '5\' 5"'  (165.1 cm)  Weight: 66.1 kg     Insurance Information  HMO:     PPO:      PCP:      IPA:      80/20:      OTHER:   PRIMARY: uninsured          Donald Prose is [employer] Rep for the [employer]  I have kept her updated on pt's progress and plan for him to return to [employer] at d/c. Patient will unlikely return to work and [name] is aware. His Visa allows him to remain until 09/2019. Farm union rep is discussing with financial counselor what benefits he may have with workers compensation and patient is asking about any compensation they may provide him as he was working when he got sick.     Donald Prose phone [phone], [employer] Representative     Medicaid Application Date:       Case Manager:   Disability Application Date:       Case Worker:      The "Data Collection Information Summary" for patients in  Inpatient Rehabilitation Facilities with attached "[place] Records" was provided and verbally reviewed with: N/A     Emergency Contact Information           Contact Information            Name    Relation    Home    Work    Mobile  Casimer Leek   Spouse   [phone]                Rudd,Joan   Other           [phone]        Kazuma, Elena   Daughter   [phone]                        Current Medical History   Patient Admitting Diagnosis: Debility, post COVID     History of Present Illness: Jonathan [name] is a 48 year old Spanish-speaker 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  Presented 02/23/2019 with increasing shortness of breath requiring intubation for airway protection follow-up critical care medicine for severe respiratory failure requiring ventilatory support related to SIRS COVID-19 infection.  Patient developed severe ATN requiring CVVHD with prolonged respiratory failure extubated later underwent tracheostomy 03/16/2019 per Dr. Nelda Marseille as well as placement of permacath 03/17/2019 for renal failure requiring CVVHD.Marland Kitchen  Renal ultrasound showed no hydronephrosis.  On 03/18/2019 patient with worsening hypoxemia and placed on intravenous heparin for suspect pulmonary emboli.  CT angiogram cannot be obtained at that time due to severe AKI.  Transitioned to Eliquis but was discontinued due to drop in hemoglobin required 2 units packed red blood cells and currently remains only on Lovenox with recommendations to continue for 3 months. Off Ventilator since 5/21. Lower extremity Dopplers were negative for DVT. He was transferred 03/30/2019 from [place] to [place] to receive hemodialysis. Off dialysis since 5/30 and catheter removed. Patient's diet has been advanced to regular after initially receiving nasogastric tube feeds.   Repeat COVID tested negative x2 on 5/29 on 04/03/2019.  Currently with a #4 cuff less trach PMSV as directed plan for capping per pulmonary services on 6/15. Will plan cap trach, if no desaturation would consider decannulation in the next 1 to 2 days per PCCM.   During patient's hospital course developed full-thickness related pressure injuries to bilateral cheeks while recovering from [place].  Wound care nurse follow-up for skin care.  Patient's renal function is stabilized latest creatinine 1.47 and no longer requiring dialysis as of 5/30.      Patient's medical record from [facility]  has been reviewed by the rehabilitation admission coordinator and physician.     Past Medical History   Diabetes T2DM     Family History    family history is not on file.     Prior Rehab/Hospitalizations  Has the patient had prior rehab or hospitalizations prior to admission? Yes     Has the patient had major surgery during 100 days prior to admission? Yes                Current Medications    Current Facility-Administered Medications:   .  0.9 %  sodium chloride infusion, , Intravenous, PRN, Charlynne Cousins, MD, Stopped at 03/21/19 2128  .  acetaminophen (TYLENOL) tablet 650 mg, 650 mg, Oral, Q6H PRN, Florencia Reasons, MD, 650 mg at 04/05/19 2346  .  chlorhexidine (PERIDEX) 0.12 % solution 15 mL, 15 mL, Mouth Rinse, BID, Collene Gobble, MD, 15 mL at 04/19/19 0909  .  Chlorhexidine Gluconate Cloth 2 % PADS 6 each, 6 each, Topical, Q0600, Corliss Parish, MD, 6 each at 04/19/19 [phone]  .  clonazePAM (KLONOPIN) disintegrating tablet 0.25 mg, 0.25 mg, Oral, Daily, Florencia Reasons, MD, 0.25 mg at 04/19/19 0910  .  enoxaparin (LOVENOX) injection 60 mg, 60 mg, Subcutaneous, Q12H, Georgette Shell, MD, 60 mg at 04/19/19 0907  .  feeding supplement (ENSURE ENLIVE) (ENSURE ENLIVE) liquid 237 mL, 237 mL, Oral, BID BM, Georgette Shell, MD, 237 mL at 04/19/19 1222  .  feeding  supplement (PRO-STAT SUGAR FREE 64) liquid 30 mL, 30 mL, Oral, TID, Florencia Reasons, MD, 30 mL at 04/19/19 0909  .  ferrous sulfate tablet 325 mg, 325 mg, Oral, Q breakfast, Irene Pap N, DO, 325 mg at 04/19/19 9381  .  glycopyrrolate (ROBINUL) injection 0.1 mg, 0.1 mg, Intravenous, Q4H PRN, Rush Farmer, MD, 0.1 mg at 03/16/19 0415  .  guaiFENesin (ROBITUSSIN) 100 MG/5ML solution 100 mg, 5 mL, Oral, BID, Florencia Reasons, MD, 100 mg at 04/19/19 0911  .  insulin aspart (novoLOG) injection 0-9 Units, 0-9 Units, Subcutaneous, TID WC, Georgette Shell, MD, 2 Units at 04/19/19 1230  .  insulin aspart (novoLOG) injection 6 Units, 6 Units, Subcutaneous, TID WC, Georgette Shell, MD, 6 Units at 04/19/19 1231  .  insulin glargine (LANTUS) injection 12 Units, 12 Units, Subcutaneous, BID, Georgette Shell, MD, 12 Units at 04/19/19 [phone]  .  ipratropium (ATROVENT HFA) inhaler 2 puff, 2 puff, Inhalation, TID, Charlynne Cousins, MD, 2 puff at 04/19/19 0740  .  levalbuterol (XOPENEX HFA) inhaler 2 puff, 2 puff, Inhalation, Q6H PRN, Kayleen Memos, DO, 2 puff at 04/17/19 2020  .  levalbuterol [facility] HFA) inhaler 2 puff, 2 puff, Inhalation, TID, Charlynne Cousins, MD, 2 puff at 04/19/19 0740  .  lip balm (CARMEX) ointment, , Topical, PRN, Collene Gobble, MD  .  MEDLINE mouth rinse, 15 mL, Mouth Rinse, q12n4p, Byrum, Rose Fillers, MD, 15 mL at 04/18/19 1700  .  metoprolol tartrate (LOPRESSOR) injection 10 mg, 10 mg, Intravenous, Q6H PRN, Charlynne Cousins, MD, 5 mg at 04/19/19 0131  .  metoprolol tartrate (LOPRESSOR) tablet 25 mg, 25 mg, Oral, BID, Irene Pap N, DO, 25 mg at 04/19/19 0910  .  morphine 2 MG/ML injection 1-2 mg, 1-2 mg, Intravenous, Q4H PRN, Bodenheimer, Charles A, NP  .  mupirocin cream (BACTROBAN) 2 %, , Topical, BID, Domenic Polite, MD  .  ondansetron [facility]) injection 4 mg, 4 mg, Intravenous, Q6H PRN, Allie Bossier, MD, 4 mg at 04/05/19 2007  .  oxyCODONE (Oxy  IR/ROXICODONE) immediate release tablet 5 mg, 5 mg, Oral, BID PRN, Florencia Reasons, MD  .  pantoprazole (PROTONIX) EC tablet 40 mg, 40 mg, Oral, BID, Florencia Reasons, MD, 40 mg at 04/19/19 0910  .  Resource ThickenUp Clear, , Oral, PRN, Florencia Reasons, MD  .  senna-docusate (Senokot-S) tablet 1 tablet, 1 tablet, Oral, BID, Florencia Reasons, MD, 1 tablet at 04/19/19 0909  .  sodium chloride flush (NS) 0.9 % injection 10-40 mL, 10-40 mL, Intracatheter, Q12H, Charlynne Cousins, MD, 10 mL at 04/18/19 2247     Patients Current Diet:       Diet Order                                       Diet regular Room service appropriate? Yes with Assist; Fluid consistency: Thin  Diet effective now                              Precautions /  Restrictions  Precautions  Precautions: Fall  Precaution Comments: trach now with PMSV  Other Brace: Prevalon =- no longer needs  Restrictions  Weight Bearing Restrictions: No      Has the patient had 2 or more falls or a fall with injury in the past year? 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 Independent     Indoor Mobility: Did the patient need assistance with walking from room to room (with or without device)? Independent     Stairs: Did the patient need assistance with internal or external stairs (with or without device)? Independent     Functional Cognition: Did the patient need help planning regular tasks such as shopping or remembering to take medications? Independent     Home Assistive Devices / Equipment  Home Assistive Devices/Equipment: None     Prior Device Use: Indicate devices/aids used by the patient prior to current illness, exacerbation or injury? None of the above     Current  Functional Level   Cognition      Overall Cognitive Status: Difficult to assess  Difficult to assess due to: Non-English speaking  Orientation Level: Oriented X4  General Comments: following commands well, pt continues to have increased awareness asking about his HR today       Extremity Assessment  (includes Sensation/Coordination)      Upper Extremity Assessment: Generalized weakness  RUE Deficits / Details: painful R shoulder ROM, able to tolerate grossly to 90* with faciliation at scapula to ensure proper gliding  RUE Coordination: decreased gross motor, decreased fine motor  LUE Deficits / Details: poor scapular humeral rhythm; improved from yesterday. Able to achieve @ 120 FF AAROM continues to improve; stronger than RUE  LUE Coordination: decreased fine motor, decreased gross motor   Lower Extremity Assessment: Defer to PT evaluation  RLE Deficits / Details: Abl eto lift SLR off bed  LLE Deficits / Details: Able to lift SLR off bed        ADLs      Overall ADL's : Needs assistance/impaired  Eating/Feeding: Set up, Supervision/ safety  Eating/Feeding Details (indicate cue type and reason): provided supervision for pt to spoon/drink tea (tea at appropriate thickness for current diet)   Grooming: Min guard, Minimal assistance, Standing, Wash/dry face  Grooming Details (indicate cue type and reason): for standing balance  Upper Body Bathing: Maximal assistance, Bed level  Lower Body Bathing: Total assistance, Bed level  Upper Body Dressing : Total assistance  Lower Body Dressing: Minimal assistance, Moderate assistance, +2 for safety/equipment, Sit to/from stand  Lower Body Dressing Details (indicate cue type and reason): pt bringing LEs up to adjust socks seated EOB, increased effort but no physical assist required  Functional mobility during ADLs: Minimal assistance, +2 for safety/equipment, Rolling walker  General ADL Comments: pt continues  to demonstrate improvements with activity tolerance        Mobility      Overal bed mobility: Needs Assistance  Bed Mobility: Supine to Sit  Supine to sit: HOB elevated, Supervision  Sit to supine: Min assist, HOB elevated  General bed mobility comments: pt received OOB in recliner        Transfers      Overall transfer level: Needs assistance  Equipment used: 4-wheeled  walker  Transfer via Lift Equipment: Stedy  Transfers: Sit to/from [employer] to Stand: Min assist, Min guard, +2 safety/equipment  Stand pivot transfers: Min assist, +2 physical assistance  Squat pivot transfers: +2 physical assistance, +2 safety/equipment, Max assist  General transfer comment: Intermittent verbal cues for hand placement. Assist to steady.        Ambulation / Gait / Stairs / Wheelchair Mobility      Ambulation/Gait  Ambulation/Gait assistance: Min guard, +2 safety/equipment  Gait Distance (Feet): 140 Feet  Assistive device: 4-wheeled walker  Gait Pattern/deviations: Step-through pattern, Decreased stride length, Narrow base of support  General Gait Details: Assist for safety and balance. Pt on 55% O2 with SpO2 to 85% with amb. Standing rest break with SpO2 returning to 90%. On return to room SpO2 86%. Returned to >90% after 1 minute.  HR to 130's with amb  Gait velocity: decr  Gait velocity interpretation: <1.31 ft/sec, indicative of household ambulator        Posture / Balance   Dynamic Sitting Balance  Sitting balance - Comments: posterior lean requiring min assist as he does LE exercises.    Balance  Overall balance assessment: Needs assistance  Sitting-balance support: No upper extremity supported, Feet supported  Sitting balance-Leahy Scale: Fair  Sitting balance - Comments: posterior lean requiring min assist as he does LE exercises.    Standing balance support: Single extremity supported  Standing balance-Leahy Scale: Fair  Standing  balance comment: UE assist        Special needs/care consideration   BiPAP/CPAP  N/a  CPM  N/a  Continuous Drip IV  N/a  Dialysis  Off Hemodialysis since 5/30  Life Vest  N/a  Oxygen  35 % trach collar morning of 6/15 with PMSV worn during all waking hours; PCCM to cap trach today per their orders  Special Bed  N/a  Trach Size #4 cuff less 5/26  Wound Vac n/a  Skin     Signed                 untitled imageuntitled image  Show:Clear all  ?Manual?Template?Copied     Added by:  ?Jeannie Fend, RN     ?Hover for detailsuntitled image     [place] Nurse wound follow up  Wound type:Full thickness device related pressure injuries to bilateral cheeks from proning with ETT while recovering from COVID-19.  No more   Measurement: LEft cheek remains necrotic, adherent today. Scarring from new epithelialization.  0.5 cm round eschar  Right cheek eschar has sloughed away.  Pink new epithelialized tissue present.  Intact and healing.     Wound bed:see above  Drainage (amount, consistency, odor) none  Peri wound: new epithelial tissue  Dressing procedure/placement/frequency: Cleanse bilateral cheeks daily with soap and water and pat dry.  Scant amount of mupirocin ointment.  Open to air.   Will not follow at this time.  Please re-consult if needed.   Domenic Moras MSN, RN, FNP-BC CWON  Wound, Ostomy, Continence Nurse  Pager [phone]             Bilateral heels cracking     Bowel mgmt:  Continent LBM 6/14  Bladder mgmt: continent  Diabetic mgmt:  Hgb A1c 5.5; recent DM diagnosis; I am unaware if patient had DM supplies to check his CBG's  Behavioral consideration anxious at times  Chemo/radiation  N/a  Spanish speaker/ interpretor to be arranged for all therapy sessions       Previous  Home Environment   Living Arrangements: Other (Comment)(lives in housing provided by employer, [place], [name])   Lives With: Other  (Comment)(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)  Available Help at Discharge: Available PRN/intermittently(other workers intermittently)  Type of Home: [place]: One [place]: Ramped entrance  [place] Shower/Tub: [place] unit, Artist: Environmental health practitioner: Yes  How Accessible: Accessible via [place]: No  Additional Comments: From [place]; works on farm through [place] for Discharge Living Setting: Other (Comment)(H2A worker, employer [employer], Conservator, 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[employer])  Type of Home at Discharge: [place]: One level  Discharge Home Access: [place] entrance  Discharge Bathroom Shower/Tub: [place] unit, [place]  Discharge Bathroom Toilet: Standard  Discharge Bathroom Accessibility: Yes  How Accessible: Accessible via walker  Does the patient have any problems obtaining your medications?: Yes (Describe)     Social/Family/Support Systems  Patient Roles: Spouse, Parent(Migrant worker)  Sport and exercise psychologist Information: [name], employer; Donald Prose union rep for [employer] union, daughter, Chrissie Noa, in [place] is main contact  Anticipated Caregiver: other workers intermittently  Anticipated Ambulance person Information: [name], employer  Ability/Limitations of Caregiver: intermittent  Caregiver Availability: Intermittent  Discharge Plan Discussed with Primary Caregiver: Yes  Is Caregiver In Agreement with Plan?: Yes  Does Caregiver/Family have Issues with Lodging/Transportation while Pt is in Rehab?: No      I have spoken with [name] who is agreeable for patient to return to farm where she will provide housing. [place], Donald Prose ( in [place]) is working with Development worker, community on his workers compensation/ Patient asking about pay and I have made union rep aware.      Goals/Additional Needs  Patient/Family Goal for Rehab: Mod I with PT,  OT, and SLP  Expected length of stay: ELOS 14 to 20 days  Cultural Considerations: Spanish Speaker form [place]  Equipment Needs: To be capped per Dr. Nelda Marseille trach on day of admission  Special Service Needs: Spanish interpreter to be arrnaged for therapy sessions  Additional Information: Donald Prose, farm union rep  Pt/Family Agrees to Admission and willing to participate: Yes  Program Orientation Provided & Reviewed with Pt/Caregiver Including Roles  & Responsibilities: Yes     Decrease burden of Care through IP rehab admission: n/a     Possible need for SNF placement upon discharge:  Not anticipated     Patient Condition: I have reviewed [facility] records [facility] , spoken with  patient. I met with patient at the bedside for inpatient rehabilitation assessment.  Patient will benefit from ongoing PT, OT and SLP, can actively participate in 3 hours of therapy a day 5 days of the week, and can make measurable gains during the admission.  Patient will also benefit from the coordinated team approach during an Inpatient Acute Rehabilitation admission.  The patient will receive intensive therapy as well as Rehabilitation physician, nursing, social worker, and care management interventions.  Due to bladder management, bowel management, safety, skin/wound care, disease management, medication administration, pain management and patient education the patient requires 24 hour a day rehabilitation nursing.  The patient is currently *min to mod assist with mobility and basic ADLs.  Discharge setting and therapy post discharge at home with home health is anticipated.  Patient has agreed to participate in the Acute Inpatient Rehabilitation Program and will admit today.     Preadmission Screen Completed By:  Annamary Rummage MSN 04/19/2019 12:48 PM  ______________________________________________________________________    Discussed status with Dr. Naaman Plummer  on  04/19/2019 at  1315  and received approval for admission today.     Admission Coordinator:  Cleatrice Burke, RN MSN, time  7622 Date  04/19/2019      Assessment/Plan:  Diagnosis: debility after respiratory failure/ARDS  1.Does the need for close, 24 hr/day Medical supervision in concert with the patient's rehab needs make it unreasonable for this patient to be served in a less intensive setting? Yes   2.Co-Morbidities requiring supervision/potential complications: DM2, trach   3.Due to bladder management, bowel management, safety, skin/wound care, disease management, medication administration, pain management and patient education, does the patient require 24 hr/day rehab nursing? Yes   4.Does the patient require coordinated care of a physician, rehab nurse, PT (1-2 hrs/day, 5 days/week), OT (1-2 hrs/day, 5 days/week) and SLP (1-2 hrs/day, 5 days/week) to address physical and functional deficits in the context of the above medical diagnosis(es)? Yes Addressing deficits in the following areas: balance, endurance, locomotion, strength, transferring, bowel/bladder control, bathing, dressing, feeding, grooming, toileting, speech and psychosocial support   5.Can the patient actively participate in an intensive therapy program of at least 3 hrs of therapy 5 days a week? Yes   6.The potential for patient to make measurable gains while on inpatient rehab is excellent   7.Anticipated functional outcomes upon discharge from inpatients are: modified independent PT, modified independent OT, modified independent SLP   8.Estimated rehab length of stay to reach the above functional goals is: 14-20 days   9.Anticipated D/C setting: Home   10.Anticipated post D/C treatments: [place] therapy   11.Overall Rehab/Functional Prognosis: excellent      MD Signature:  Meredith Staggers, MD, [place] [facility]  04/19/2019                  Revision History                                                   Meredith Staggers, MD    Meredith Staggers, MD  Physician  Physical Medicine and Rehabilitation     PMR Pre-admission  Signed     Date of Service:  04/19/2019 12:48 PM        Related encounter: ED to Hosp-Admission (Current) from 02/23/2019 in [place] [facility]             Signed                    Expand widget buttonCollapse widget button    Show:Clear all   ManualTemplateCopied  Added by:     Cristina Gong, RN  Meredith Staggers, MD   Hover for detailscustomization button  PMR Admission Coordinator Pre-Admission Assessment     Patient: Jonathan [name] is an 48 y.o., male  MRN: [id number]  DOB: 07/02/1971  Height: '5\' 5"'  (165.1 cm)  Weight: 66.1 kg     Insurance Information  HMO:     PPO:      PCP:      IPA:      80/20:      OTHER:   PRIMARY: uninsured          Donald Prose is [employer] Rep for the [employer]  I have kept her updated on pt's progress and plan for him to return to [employer] at d/c. Patient will unlikely return to work and [name] is aware. His Visa allows him to remain until 09/2019. Farm union rep is discussing with financial counselor what benefits he may have with workers compensation and patient is asking about any compensation they may provide him as he was working when he got sick.     Donald Prose phone [phone], [employer] Representative     Medicaid Application Date:       Case Manager:   Disability Application Date:       Case Worker:      The "Data  Collection Information Summary" for patients in Inpatient Rehabilitation Facilities with attached "Privacy Act [place] Records" was provided and verbally reviewed with: N/A     Emergency Contact Information           Contact Information            Name    Relation    Home    Work    [place], [place]   Spouse   [phone]                Rudd,Joan   Other           [place], [place]   Daughter   [phone]                        Current Medical History   Patient Admitting Diagnosis: Debility, post COVID     History of Present Illness: Jonathan [name] is a 48 year old Spanish-speaker 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  Presented 02/23/2019 with increasing shortness of breath requiring intubation for airway protection follow-up critical care medicine for severe respiratory failure requiring ventilatory support related to SIRS COVID-19 infection.  Patient developed severe ATN requiring CVVHD with prolonged respiratory failure extubated later underwent tracheostomy 03/16/2019 per Dr. Nelda Marseille as well as placement of permacath 03/17/2019 for renal failure requiring CVVHD.Marland Kitchen  Renal ultrasound showed no hydronephrosis.  On 03/18/2019 patient with worsening hypoxemia and placed on intravenous heparin for suspect pulmonary emboli.  CT angiogram cannot be obtained at that time due to severe AKI.  Transitioned to Eliquis but was discontinued due to drop in hemoglobin required 2 units packed red blood cells and currently remains only on Lovenox with recommendations to continue for 3 months. Off Ventilator since 5/21. Lower extremity Dopplers were negative for DVT. He was transferred 03/30/2019 from [place] to [place] to receive hemodialysis. Off dialysis since 5/30 and catheter removed. Patient's diet has been advanced to regular after  initially receiving nasogastric tube feeds.  Repeat COVID tested negative x2 on 5/29 on 04/03/2019.  Currently with a #4 cuff less trach PMSV  as directed plan for capping per pulmonary services on 6/15. Will plan cap trach, if no desaturation would consider decannulation in the next 1 to 2 days per PCCM.   During patient's hospital course developed full-thickness related pressure injuries to bilateral cheeks while recovering from [place].  Wound care nurse follow-up for skin care.  Patient's renal function is stabilized latest creatinine 1.47 and no longer requiring dialysis as of 5/30.      Patient's medical record from [facility]  has been reviewed by the rehabilitation admission coordinator and physician.     Past Medical History   Diabetes T2DM     Family History    family history is not on file.     Prior Rehab/Hospitalizations  Has the patient had prior rehab or hospitalizations prior to admission? Yes     Has the patient had major surgery during 100 days prior to admission? Yes                Current Medications    Current Facility-Administered Medications:   .  0.9 %  sodium chloride infusion, , Intravenous, PRN, Charlynne Cousins, MD, Stopped at 03/21/19 2128  .  acetaminophen (TYLENOL) tablet 650 mg, 650 mg, Oral, Q6H PRN, Florencia Reasons, MD, 650 mg at 04/05/19 2346  .  chlorhexidine (PERIDEX) 0.12 % solution 15 mL, 15 mL, Mouth Rinse, BID, Collene Gobble, MD, 15 mL at 04/19/19 0909  .  Chlorhexidine Gluconate Cloth 2 % PADS 6 each, 6 each, Topical, Q0600, Corliss Parish, MD, 6 each at 04/19/19 [phone]  .  clonazePAM (KLONOPIN) disintegrating tablet 0.25 mg, 0.25 mg, Oral, Daily, Florencia Reasons, MD, 0.25 mg at 04/19/19 0910  .  enoxaparin (LOVENOX) injection 60 mg, 60 mg, Subcutaneous, Q12H, Georgette Shell, MD, 60 mg at 04/19/19 0907  .  feeding supplement (ENSURE ENLIVE) (ENSURE ENLIVE) liquid 237 mL, 237 mL, Oral, BID BM, Georgette Shell, MD,  237 mL at 04/19/19 1222  .  feeding supplement (PRO-STAT SUGAR FREE 64) liquid 30 mL, 30 mL, Oral, TID, Florencia Reasons, MD, 30 mL at 04/19/19 0909  .  ferrous sulfate tablet 325 mg, 325 mg, Oral, Q breakfast, Irene Pap N, DO, 325 mg at 04/19/19 9163  .  glycopyrrolate (ROBINUL) injection 0.1 mg, 0.1 mg, Intravenous, Q4H PRN, Rush Farmer, MD, 0.1 mg at 03/16/19 0415  .  guaiFENesin (ROBITUSSIN) 100 MG/5ML solution 100 mg, 5 mL, Oral, BID, Florencia Reasons, MD, 100 mg at 04/19/19 0911  .  insulin aspart (novoLOG) injection 0-9 Units, 0-9 Units, Subcutaneous, TID WC, Georgette Shell, MD, 2 Units at 04/19/19 1230  .  insulin aspart (novoLOG) injection 6 Units, 6 Units, Subcutaneous, TID WC, Georgette Shell, MD, 6 Units at 04/19/19 1231  .  insulin glargine (LANTUS) injection 12 Units, 12 Units, Subcutaneous, BID, Georgette Shell, MD, 12 Units at 04/19/19 [phone]  .  ipratropium (ATROVENT HFA) inhaler 2 puff, 2 puff, Inhalation, TID, Charlynne Cousins, MD, 2 puff at 04/19/19 0740  .  levalbuterol (XOPENEX HFA) inhaler 2 puff, 2 puff, Inhalation, Q6H PRN, Kayleen Memos, DO, 2 puff at 04/17/19 2020  .  levalbuterol [facility] HFA) inhaler 2 puff, 2 puff, Inhalation, TID, Charlynne Cousins, MD, 2 puff at 04/19/19 0740  .  lip balm (CARMEX) ointment, , Topical, PRN, Collene Gobble, MD  .  MEDLINE mouth rinse, 15 mL, Mouth Rinse, q12n4p, Byrum, Rose Fillers, MD, 15 mL at 04/18/19 1700  .  metoprolol tartrate (LOPRESSOR) injection 10 mg, 10 mg, Intravenous, Q6H PRN, Charlynne Cousins, MD, 5 mg at 04/19/19 0131  .  metoprolol tartrate (LOPRESSOR) tablet 25 mg, 25 mg, Oral, BID, Irene Pap N, DO, 25 mg at 04/19/19 0910  .  morphine 2 MG/ML injection 1-2 mg, 1-2 mg, Intravenous, Q4H PRN, Bodenheimer, Charles A, NP  .  mupirocin cream (BACTROBAN) 2 %, , Topical, BID, Domenic Polite, MD  .  ondansetron [facility]) injection 4 mg, 4 mg, Intravenous, Q6H PRN, Allie Bossier, MD, 4 mg at  04/05/19 2007  .  oxyCODONE (Oxy IR/ROXICODONE) immediate release tablet 5 mg, 5 mg, Oral, BID PRN, Florencia Reasons, MD  .  pantoprazole (PROTONIX) EC tablet 40 mg, 40 mg, Oral, BID, Florencia Reasons, MD, 40 mg at 04/19/19 0910  .  Resource ThickenUp Clear, , Oral, PRN, Florencia Reasons, MD  .  senna-docusate (Senokot-S) tablet 1 tablet, 1 tablet, Oral, BID, Florencia Reasons, MD, 1 tablet at 04/19/19 0909  .  sodium chloride flush (NS) 0.9 % injection 10-40 mL, 10-40 mL, Intracatheter, Q12H, Charlynne Cousins, MD, 10 mL at 04/18/19 2247     Patients Current Diet:       Diet Order                                       Diet regular Room service appropriate? Yes with Assist; Fluid consistency: Thin  Diet effective now                              Precautions / Restrictions  Precautions  Precautions: Fall  Precaution Comments: trach now with PMSV  Other Brace: Prevalon =- no longer needs  Restrictions  Weight Bearing Restrictions: No      Has the patient had 2 or more falls or a fall with injury in the past year? 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 Independent     Indoor Mobility: Did the patient need assistance with walking from room to room (with or without device)? Independent     Stairs: Did the patient need assistance with internal or external stairs (with or without device)? Independent     Functional Cognition: Did the patient need help planning regular tasks such as shopping or remembering to take medications? Independent     Home Assistive Devices / Equipment  Home Assistive Devices/Equipment: None     Prior Device Use: Indicate devices/aids used by the patient prior to current illness, exacerbation or injury?  None of the above     Current Functional Level   Cognition      Overall Cognitive Status: Difficult to assess  Difficult to assess due to: Non-English speaking  Orientation Level: Oriented X4  General Comments: following commands well, pt continues to have increased awareness asking about his HR today       Extremity Assessment  (includes Sensation/Coordination)      Upper Extremity Assessment: Generalized weakness  RUE Deficits / Details: painful R shoulder ROM, able to tolerate grossly to 90* with faciliation at scapula to ensure proper gliding  RUE Coordination: decreased gross motor, decreased  fine motor  LUE Deficits / Details: poor scapular humeral rhythm; improved from yesterday. Able to achieve @ 120 FF AAROM continues to improve; stronger than RUE  LUE Coordination: decreased fine motor, decreased gross motor   Lower Extremity Assessment: Defer to PT evaluation  RLE Deficits / Details: Abl eto lift SLR off bed  LLE Deficits / Details: Able to lift SLR off bed        ADLs      Overall ADL's : Needs assistance/impaired  Eating/Feeding: Set up, Supervision/ safety  Eating/Feeding Details (indicate cue type and reason): provided supervision for pt to spoon/drink tea (tea at appropriate thickness for current diet)   Grooming: Min guard, Minimal assistance, Standing, Wash/dry face  Grooming Details (indicate cue type and reason): for standing balance  Upper Body Bathing: Maximal assistance, Bed level  Lower Body Bathing: Total assistance, Bed level  Upper Body Dressing : Total assistance  Lower Body Dressing: Minimal assistance, Moderate assistance, +2 for safety/equipment, Sit to/from stand  Lower Body Dressing Details (indicate cue type and reason): pt bringing LEs up to adjust socks seated EOB, increased effort but no physical assist required  Functional mobility during ADLs: Minimal assistance, +2 for safety/equipment, Rolling  walker  General ADL Comments: pt continues to demonstrate improvements with activity tolerance        Mobility      Overal bed mobility: Needs Assistance  Bed Mobility: Supine to Sit  Supine to sit: HOB elevated, Supervision  Sit to supine: Min assist, HOB elevated  General bed mobility comments: pt received OOB in recliner        Transfers      Overall transfer level: Needs assistance  Equipment used: 4-wheeled walker  Transfer via Lift Equipment: Stedy  Transfers: Sit to/from Stand  Sit to Stand: Min assist, Min guard, +2 safety/equipment  Stand pivot transfers: Min assist, +2 physical assistance  Squat pivot transfers: +2 physical assistance, +2 safety/equipment, Max assist  General transfer comment: Intermittent verbal cues for hand placement. Assist to steady.        Ambulation / Gait / Stairs / Wheelchair Mobility      Ambulation/Gait  Ambulation/Gait assistance: Min guard, +2 safety/equipment  Gait Distance (Feet): 140 Feet  Assistive device: 4-wheeled walker  Gait Pattern/deviations: Step-through pattern, Decreased stride length, Narrow base of support  General Gait Details: Assist for safety and balance. Pt on 55% O2 with SpO2 to 85% with amb. Standing rest break with SpO2 returning to 90%. On return to room SpO2 86%. Returned to >90% after 1 minute.  HR to 130's with amb  Gait velocity: decr  Gait velocity interpretation: <1.31 ft/sec, indicative of household ambulator        Posture / Balance   Dynamic Sitting Balance  Sitting balance - Comments: posterior lean requiring min assist as he does LE exercises.    Balance  Overall balance assessment: Needs assistance  Sitting-balance support: No upper extremity supported, Feet supported  Sitting balance-Leahy Scale: Fair  Sitting balance - Comments: posterior lean requiring min assist as he does LE exercises.    Standing balance support: Single extremity supported  Standing  balance-Leahy Scale: Fair  Standing balance comment: UE assist        Special needs/care consideration   BiPAP/CPAP  N/a  CPM  N/a  Continuous Drip IV  N/a  Dialysis  Off Hemodialysis since 5/30  Life Vest  N/a  Oxygen  35 % trach collar morning of 6/15 with PMSV worn during all  waking hours; PCCM to cap trach today per their orders  Special Bed  N/a  Trach Size #4 cuff less 5/26  Wound Vac n/a  Skin     Signed                 untitled imageuntitled image  Show:Clear all  ?Manual?Template?Copied     Added by:  ?Jeannie Fend, RN     ?Hover for detailsuntitled image     [place] Nurse wound follow up  Wound type:Full thickness device related pressure injuries to bilateral cheeks from proning with ETT while recovering from COVID-19.  No more   Measurement: LEft cheek remains necrotic, adherent today. Scarring from new epithelialization.  0.5 cm round eschar  Right cheek eschar has sloughed away.  Pink new epithelialized tissue present.  Intact and healing.     Wound bed:see above  Drainage (amount, consistency, odor) none  Peri wound: new epithelial tissue  Dressing procedure/placement/frequency: Cleanse bilateral cheeks daily with soap and water and pat dry.  Scant amount of mupirocin ointment.  Open to air.   Will not follow at this time.  Please re-consult if needed.   Domenic Moras MSN, RN, FNP-BC CWON  Wound, Ostomy, Continence Nurse  Pager [phone]             Bilateral heels cracking     Bowel mgmt:  Continent LBM 6/14  Bladder mgmt: continent  Diabetic mgmt:  Hgb A1c 5.5; recent DM diagnosis; I am unaware if patient had DM supplies to check his CBG's  Behavioral consideration anxious at times  Chemo/radiation  N/a  Spanish speaker/ interpretor to be arranged for all therapy sessions       Previous Home Environment   Living Arrangements: Other (Comment)(lives in housing provided by employer, [employer])   Lives With: Other (Comment)(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)  Available Help at Discharge: Available PRN/intermittently(other workers intermittently)  Type of Home: [place]: One [place]: Ramped entrance  [place] Shower/Tub: [place] unit, Artist: Environmental health practitioner: Yes  How Accessible: Accessible via [place]: No  Additional Comments: From [place]; works on farm through [place] for Discharge Living Setting: Other (Comment)(H2A worker, employer [employer], Conservator, 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[employer])  Type of Home at Discharge: [place]: One level  Discharge Home Access: [place] entrance  Discharge Bathroom Shower/Tub: [place] unit, [place]  Discharge Bathroom Toilet: Standard  Discharge Bathroom Accessibility: Yes  How Accessible: Accessible via walker  Does the patient have any problems obtaining your medications?: Yes (Describe)     Social/Family/Support Systems  Patient Roles: Spouse, Parent(Migrant worker)  Sport and exercise psychologist Information: [name], employer; Educational psychologist union rep for [employer] union, daughter, Chrissie Noa, in [place] is main contact  Anticipated Caregiver: other workers intermittently  Anticipated Ambulance person Information: [name], employer  Ability/Limitations of Caregiver: intermittent  Caregiver Availability: Intermittent  Discharge Plan Discussed with Primary Caregiver: Yes  Is Caregiver In Agreement with Plan?: Yes  Does Caregiver/Family have Issues with Lodging/Transportation while Pt is in Rehab?: No      I have spoken with [name] who is agreeable for patient to return to farm where she will provide housing. [place], Donald Prose ( in [place]) is working with Development worker, community on his workers compensation/ Patient asking about pay and I have made union rep aware.      Goals/Additional  Needs  Patient/Family Goal for Rehab: Mod I with PT, OT, and SLP  Expected length of stay: ELOS 14 to 20 days  Cultural Considerations: Spanish Speaker form [place]  Equipment Needs: To be capped per Dr. Nelda Marseille trach on day of admission  Special Service Needs: Spanish interpreter to be arrnaged for therapy sessions  Additional Information: Donald Prose, farm union rep  Pt/Family Agrees to Admission and willing to participate: Yes  Program Orientation Provided & Reviewed with Pt/Caregiver Including Roles  & Responsibilities: Yes     Decrease burden of Care through IP rehab admission: n/a     Possible need for SNF placement upon discharge:  Not anticipated     Patient Condition: I have reviewed medical records from [facility] , spoken with  patient. I met with patient at the bedside for inpatient rehabilitation assessment.  Patient will benefit from ongoing PT, OT and SLP, can actively participate in 3 hours of therapy a day 5 days of the week, and can make measurable gains during the admission.  Patient will also benefit from the coordinated team approach during an Inpatient Acute Rehabilitation admission.  The patient will receive intensive therapy as well as Rehabilitation physician, nursing, social worker, and care management interventions.  Due to bladder management, bowel management, safety, skin/wound care, disease management, medication administration, pain management and patient education the patient requires 24 hour a day rehabilitation nursing.  The patient is currently *min to mod assist with mobility and basic ADLs.  Discharge setting and therapy post discharge at home with home health is anticipated.  Patient has agreed to participate in the Acute Inpatient Rehabilitation Program and will admit today.     Preadmission Screen Completed By:  Annamary Rummage MSN 04/19/2019 12:48 PM  ______________________________________________________________________     Discussed status with Dr. Naaman Plummer  on  04/19/2019 at  1315 and received approval for admission today.     Admission Coordinator:  Cleatrice Burke, RN MSN, time  7425 Date  04/19/2019      Assessment/Plan:  Diagnosis: debility after respiratory failure/ARDS  1.Does the need for close, 24 hr/day Medical supervision in concert with the patient's rehab needs make it unreasonable for this patient to be served in a less intensive setting? Yes   2.Co-Morbidities requiring supervision/potential complications: DM2, trach   3.Due to bladder management, bowel management, safety, skin/wound care, disease management, medication administration, pain management and patient education, does the patient require 24 hr/day rehab nursing? Yes   4.Does the patient require coordinated care of a physician, rehab nurse, PT (1-2 hrs/day, 5 days/week), OT (1-2 hrs/day, 5 days/week) and SLP (1-2 hrs/day, 5 days/week) to address physical and functional deficits in the context of the above medical diagnosis(es)? Yes Addressing deficits in the following areas: balance, endurance, locomotion, strength, transferring, bowel/bladder control, bathing, dressing, feeding, grooming, toileting, speech and psychosocial support   5.Can the patient actively participate in an intensive therapy program of at least 3 hrs of therapy 5 days a week? Yes   6.The potential for patient to make measurable gains while on inpatient rehab is excellent   7.Anticipated functional outcomes upon discharge from inpatients are: modified independent PT, modified independent OT, modified independent SLP   8.Estimated rehab length of stay to reach the above functional goals is: 14-20 days   9.Anticipated D/C setting: Home   10.Anticipated post D/C treatments: [place] therapy   11.Overall Rehab/Functional Prognosis: excellent      MD Signature:  Meredith Staggers, MD, [facility]  04/19/2019                  Revision History

## 2019-04-19 NOTE — TOC Transition Note (Signed)
Transition of Care [facility]) - CM/SW Discharge Note   Patient Details  Name: Jonathan [name] MRN: [id number] Date of Birth: 1971/06/17  [facility] CM/SW Contact:  Zenon Mayo, RN Phone Number: 04/19/2019, 11:35 AM   Clinical Narrative:    Patient to discharge to CIR today per Pamala Hurry.     Final next level of care: IP Rehab Facility Barriers to Discharge: No Barriers Identified   Patient Goals and CMS Choice Patient states their goals for this hospitalization and ongoing recovery are:: Go to CIR      Discharge Placement                       Discharge Plan and Services                DME Arranged: (NA)         HH Arranged: NA          Social Determinants of Health (SDOH) Interventions     Readmission Risk Interventions Readmission Risk Prevention Plan 03/17/2019  Transportation Screening Not Complete  Medication Review Press photographer) Not Complete  HRI or Home Care Consult Not Complete  Palliative Care Screening Not Complete

## 2019-04-19 NOTE — Progress Notes (Signed)
NAME:  Gary Gabrielsen, MRN:  [id number], DOB:  16-Apr-1971, LOS: 51 ADMISSION DATE:  02/23/2019, CONSULTATION DATE: February 23, 2019 REFERRING MD: Regenia Skeeter, CHIEF COMPLAINT: Dyspnea  Brief History   48 year old male admitted on February 23, 2019 with ARDS due to COVID-19.  He has largely recovered from severe ARDS which originally required prone positioning for many days.  Developed acute kidney injury requiring hemodialysis.  Has been liberated from the ventilator as of Mar 25, 2019.    Past Medical History  Diabetes mellitus type 2 Hypertension  Significant Hospital Events   4/21 admitted and immediately proned on inverse ratio PC ventilation with high PEEP to maintain saturation >85%, Started on NO at 40ppm. 4/22- Tocluzimab X 1 4/22- started CVVHD 4/21-4/24 Proned daily 4/28 -4/30 proned daily , tapered NO to off  4/30 -late evening with increased O2 demands . No paralytics required x 24hr . No vent dysyncrony . Calm on Fent /Versed. Off precedex   5/1 - Remains on CRRT , no acute issues overnight --4.8 L I/O Balance since admit . Continues to Prone on/off 16 on and 8 off . -CXR stable diffuse bilateral infiltrates (slightly improved aeration),  Vent .80Fio2 .  5/3 convalescent plasma  Biomarkes improving but severe ARDS persists ad D-dimer still very high (13) without improvement. On IV Heparin gtt. Growing MSSA. SEdation changed to dilaudid gtt. Continues prone 16h and 8h syupine. Currently supine at 80% fio2, peep 18. On TF. STARTED NIMBEX  5/4-5/5-able to stay in the supine position, able to discontinue paralysis and maintain FiO2 0.50, PEEP 8-10.  5/5 hematochezia, bloody gastric aspirate  5/8 followed commands  5/11 extubated  5/12 re-intubated, tracheostomy  5/13 perm cath  5/14 worsening hypoxemia, shock suddenly, RV dilated, no new CXR changes, treated for PE empirically  5/21 doing t-piece trials May 23: Trial off of CVVHD May 25 remains off of  ventilator support since May 21, remains anuric, plan for CVVHD again today  Consults:  PCCM 4/21  Procedures:  4/21 - ETT 7.5 >> 5/12 5/12 Trach >> 4/21 - RIJ CVC [facility] >>5/10 5/10 - CVC >> 5/13 4/22 L IJ HD Cath>5/15 5/13 R IJ Permcath>    Significant Diagnostic Tests:  POC U/S 4/21: Bilateral interstitial pattern with bilateral dense consolidation at bases. Echo shows low-normal LVSF. RV moderately dilated with septal shift.  5/14 Echo> LVEF > 65%, RV dilated, decreased RV function (moderate) with RVSP 60  Micro Data:  Blood cultures -PND SARS-CoV2 02/23/2019 - - POSITIVE HIV 4/21 -neg Quant gold 4/2 1- neg .....................Marland Kitchen resp 4/29 >>rare MSSA and yeast  5/10 blood > neg 5/18 c. Diff > neg  Anti-covid RX and antimicrobial RX  Azithromycin 4/21 >>4/25 Ceftriaxone 4/22 >>4/25 Actemra 4/22 Hydroxychloroquine 4/21 > 4/22 ........................... Ceftriaxone 5/2 >>5/10 Cefepime 5/10>>> 5/12 Zosyn 5/12 >>5/17 Vancomycin 5/10>>>5/17  Fluconazole 5/12 > 5/17   Interim history/subjective:  PMV in place, off O2, TC with humidity  Objective   Blood pressure 131/86, pulse 93, temperature 98.6 F (37 C), temperature source Oral, resp. rate (!) 32, height 5\' 5"  (1.651 m), weight 66.1 kg, SpO2 96 %.    FiO2 (%):  [35 %-40 %] 35 %   Intake/Output Summary (Last 24 hours) at 04/19/2019 1121 Last data filed at 04/19/2019 0641 Gross per 24 hour  Intake 320 ml  Output 1925 ml  Net -1605 ml   Filed Weights   04/17/19 0300 04/18/19 0500 04/19/19 0639  Weight: 66.2 kg 66.4 kg 66.1 kg  Examination:  General:  Well appearing, NAD HENT: [place]/AT, PERRL, EOM-I and trach in place with PMV PULM: Soft, NT, ND and +BS CV: RRR, Nl S1/S2 and -M/R/G GI: soft, NT, ND and +BS MSK: normal bulk and tone Neuro: awake, alert, no distress, MAEW  I reviewed CXR myself, trach   Resolved Hospital Problem list     Assessment & Plan:  Suspected pulmonary  embolism: Acute hypoxemia, RV dilation on echo Hemoptysis has resolved Plan 3 months of empiric anticoagulation  Acute respiratory failure with hypoxemia, recovering ARDS from COVID-19: Continue oxygen via tracheostomy tube Cap trach overnight, if no desaturation then will consider decannulation in the next 1-2 day Continue Passy-Muir valve for speaking  Anemia: Most likely cause intermittent bleeding from suctioning as well as anemia of chronic kidney Monitor for bleeding Transfuse PRBC for Hgb < 7 gm/dL  Discussed with PCCM-NP  Best practice:  Diet: per [facility]  Labs   CBC: Recent Labs  Lab 04/13/19 0449 04/14/19 0546 04/15/19 0517 04/19/19 0609  WBC 10.1 10.0 9.9 10.0  HGB 7.5* 7.7* 7.8* 8.8*  HCT 24.0* 24.8* 25.6* 28.4*  MCV 93.8 94.7 95.2 95.0  PLT 334 357 395 429*    Basic Metabolic Panel: Recent Labs  Lab 04/13/19 0449 04/14/19 0546 04/15/19 0517 04/17/19 0854  NA 136 138 137  --   K 3.4* 3.4* 3.7  --   CL 103 105 105  --   CO2 22 23 21*  --   GLUCOSE 111* 105* 107*  --   BUN 38* 33* 38*  --   CREATININE 1.54* 1.25* 1.47* 1.05  CALCIUM 9.7 9.9 9.6  --   PHOS 3.6 4.1  --   --    GFR: Estimated Creatinine Clearance: 74.8 mL/min (by C-G formula based on SCr of 1.05 mg/dL). Recent Labs  Lab 04/13/19 0449 04/14/19 0546 04/15/19 0517 04/19/19 0609  WBC 10.1 10.0 9.9 10.0    Liver Function Tests: Recent Labs  Lab 04/13/19 0449 04/14/19 0546  AST 26 21  ALT 30 29  ALKPHOS 121 120  BILITOT 0.6 0.3  PROT 6.5 6.7  ALBUMIN 2.6* 2.7*   No results for input(s): LIPASE, AMYLASE in the last 168 hours. No results for input(s): AMMONIA in the last 168 hours.  ABG    Component Value Date/Time   PHART 7.381 03/18/2019 1112   PCO2ART 42.1 03/18/2019 1112   PO2ART 50.0 (L) 03/18/2019 1112   HCO3 25.1 03/18/2019 1112   TCO2 26 03/18/2019 1112   ACIDBASEDEF 1.0 03/16/2019 0450   O2SAT 85.0 03/18/2019 1112     Coagulation Profile: No results for  input(s): INR, PROTIME in the last 168 hours.  Cardiac Enzymes: No results for input(s): CKTOTAL, CKMB, CKMBINDEX, TROPONINI in the last 168 hours.  HbA1C: Hgb A1c MFr Bld  Date/Time Value Ref Range Status  03/23/2019 06:15 AM 5.5 4.8 - 5.6 % Final    Comment:    (NOTE) Pre diabetes:          5.7%-6.4% Diabetes:              >6.4% Glycemic control for   <7.0% adults with diabetes     CBG: Recent Labs  Lab 04/18/19 0730 04/18/19 1134 04/18/19 1610 04/18/19 2112 04/19/19 0810  GLUCAP 132* 156* 144* 226* 150*    Rush Farmer, M.D. [facility] Pulmonary/Critical Care Medicine. Pager: [phone]. After hours pager: [phone].

## 2019-04-19 NOTE — Progress Notes (Signed)
Patient arrived to unit and oriented to unit activities via interpreter call. Attempted to get computer call in to daughter in [place] to give family patient's direct phone number but video assistant not available. Patient stated understanding to call for assistance and demonstrated use of call bell. Clarified code status wishes with patient via interpreter and called on call for order change to full code.    Resting comfortably with call bell in reach.

## 2019-04-20 ENCOUNTER — Inpatient Hospital Stay (HOSPITAL_COMMUNITY): Payer: Self-pay | Admitting: Physical Therapy

## 2019-04-20 ENCOUNTER — Inpatient Hospital Stay (HOSPITAL_COMMUNITY): Payer: Self-pay

## 2019-04-20 ENCOUNTER — Inpatient Hospital Stay (HOSPITAL_COMMUNITY): Payer: Self-pay | Admitting: Occupational Therapy

## 2019-04-20 DIAGNOSIS — R0902 Hypoxemia: Secondary | ICD-10-CM

## 2019-04-20 DIAGNOSIS — E119 Type 2 diabetes mellitus without complications: Secondary | ICD-10-CM

## 2019-04-20 DIAGNOSIS — Z930 Tracheostomy status: Secondary | ICD-10-CM

## 2019-04-20 DIAGNOSIS — I2699 Other pulmonary embolism without acute cor pulmonale: Secondary | ICD-10-CM

## 2019-04-20 DIAGNOSIS — J9601 Acute respiratory failure with hypoxia: Secondary | ICD-10-CM

## 2019-04-20 DIAGNOSIS — R000 Tachycardia, unspecified: Secondary | ICD-10-CM

## 2019-04-20 DIAGNOSIS — D62 Acute posthemorrhagic anemia: Secondary | ICD-10-CM

## 2019-04-20 LAB — COMPREHENSIVE METABOLIC PANEL
ALT: 27 U/L (ref 0–44)
AST: 18 U/L (ref 15–41)
Albumin: 3 g/dL — ABNORMAL LOW (ref 3.5–5.0)
Alkaline Phosphatase: 127 U/L — ABNORMAL HIGH (ref 38–126)
Anion gap: 9 (ref 5–15)
BUN: 26 mg/dL — ABNORMAL HIGH (ref 6–20)
CO2: 22 mmol/L (ref 22–32)
Calcium: 9.5 mg/dL (ref 8.9–10.3)
Chloride: 105 mmol/L (ref 98–111)
Creatinine, Ser: 1.05 mg/dL (ref 0.61–1.24)
GFR calc Af Amer: >60 mL/min (ref >60)
GFR calc non Af Amer: >60 mL/min (ref >60)
Glucose, Bld: 232 mg/dL — ABNORMAL HIGH (ref 70–99)
Potassium: 4 mmol/L (ref 3.5–5.1)
Sodium: 136 mmol/L (ref 135–145)
Total Bilirubin: 0.4 mg/dL (ref 0.3–1.2)
Total Protein: 7.2 g/dL (ref 6.5–8.1)

## 2019-04-20 LAB — GLUCOSE, CAPILLARY
Glucose-Capillary: 128 mg/dL — ABNORMAL HIGH (ref 70–99)
Glucose-Capillary: 172 mg/dL — ABNORMAL HIGH (ref 70–99)
Glucose-Capillary: 172 mg/dL — ABNORMAL HIGH (ref 70–99)
Glucose-Capillary: 174 mg/dL — ABNORMAL HIGH (ref 70–99)

## 2019-04-20 LAB — CBC WITH DIFFERENTIAL/PLATELET
Abs Immature Granulocytes: 0.11 10*3/uL — ABNORMAL HIGH (ref 0.00–0.07)
Basophils Absolute: 0.1 10*3/uL (ref 0.0–0.1)
Basophils Relative: 1 %
Eosinophils Absolute: 0.5 10*3/uL (ref 0.0–0.5)
Eosinophils Relative: 5 %
HCT: 30.5 % — ABNORMAL LOW (ref 39.0–52.0)
Hemoglobin: 9.3 g/dL — ABNORMAL LOW (ref 13.0–17.0)
Immature Granulocytes: 1 %
Lymphocytes Relative: 45 %
Lymphs Abs: 4.2 10*3/uL — ABNORMAL HIGH (ref 0.7–4.0)
MCH: 29 pg (ref 26.0–34.0)
MCHC: 30.5 g/dL (ref 30.0–36.0)
MCV: 95 fL (ref 80.0–100.0)
Monocytes Absolute: 0.7 10*3/uL (ref 0.1–1.0)
Monocytes Relative: 7 %
Neutro Abs: 3.9 10*3/uL (ref 1.7–7.7)
Neutrophils Relative %: 41 %
Platelets: 390 10*3/uL (ref 150–400)
RBC: 3.21 MIL/uL — ABNORMAL LOW (ref 4.22–5.81)
RDW: 14.7 % (ref 11.5–15.5)
WBC: 9.5 10*3/uL (ref 4.0–10.5)
nRBC: 0 % (ref 0.0–0.2)

## 2019-04-20 NOTE — IPOC Note (Signed)
Individualized overall Plan of Care [facility]) Patient Details Name: Jonathan [name] MRN: [id number] DOB: 07-Jan-1971  Admitting Diagnosis: [place] Hospital Problems: Active Problems:   Debility   Acute blood loss anemia   Tracheostomy status ([place])   Tachycardia   Diabetes mellitus type 2 in nonobese (HCC)   Acute pulmonary embolism (HCC)     Functional Problem List: Nursing Endurance, Medication Management, Nutrition, Safety, Skin Integrity  PT Balance, Endurance, Motor, Safety  OT Balance, Endurance, Motor  SLP    TR         Basic ADL's: OT Grooming, Eating, Bathing, Dressing, Toileting     Advanced  ADL's: OT Simple Meal Preparation     Transfers: PT Bed Mobility, Bed to Chair, Floor, Car, Manufacturing systems engineer, Tub/Shower     Locomotion: PT Stairs, Ambulation     Additional Impairments: OT None  SLP None      TR      Anticipated Outcomes Item Anticipated Outcome  Self Feeding Independent   Swallowing      Basic self-care  Mod I  Toileting  Mod I   Bathroom Transfers Mod I  Bowel/Bladder  remain continent, empty regularly  Transfers  mod/i  Locomotion  mod/i household gait  Communication     Cognition     Pain  no pain  Safety/Judgment  REmain free of falls, infection, skin breakdown   Therapy Plan: PT Intensity: Minimum of 1-2 x/day ,45 to 90 minutes PT Frequency: 5 out of 7 days PT Duration Estimated Length of Stay: 10-14 days OT Intensity: Minimum of 1-2 x/day, 45 to 90 minutes OT Frequency: 5 out of 7 days OT Duration/Estimated Length of Stay: 12-14 days      Team Interventions: Nursing Interventions Patient/Family Education, Disease Management/Prevention, Skin Care/Wound Management, Dysphagia/Aspiration Precaution Training, Discharge Planning, Psychosocial Support  PT interventions Ambulation/gait training, DME/adaptive equipment instruction, Discharge planning, Functional mobility training, Pain management, Psychosocial  support, Splinting/orthotics, Therapeutic Activities, UE/LE Strength taining/ROM, Therapeutic Exercise, UE/LE Coordination activities, Stair training, Skin care/wound management, Patient/family education, Neuromuscular re-education, Functional electrical stimulation, Disease management/prevention, Academic librarian, Training and development officer  OT Interventions Training and development officer, Academic librarian, Discharge planning, Disease mangement/prevention, Engineer, drilling, Functional mobility training, Neuromuscular re-education, Patient/family education, Psychosocial support, Self Care/advanced ADL retraining, Therapeutic Activities, Therapeutic Exercise, UE/LE Strength taining/ROM, UE/LE Coordination activities  SLP Interventions    TR Interventions    SW/CM Interventions Discharge Planning, Psychosocial Support, Patient/Family Education   Barriers to Discharge MD  Medical stability, Trach and Wound care  Nursing Decreased caregiver support, Lurline Idol, Lack of/limited family support no family in [place], very limited English  PT Decreased caregiver support, Medical stability, Lack of/limited family support Pt from [place], on work VISA, no family in [place], roommates to provide PRN assist  OT      SLP      SW       Team Discharge Planning: Destination: PT-Home([place], until medically ready to return to [place]) ,OT- Home , SLP-Home Projected Follow-up: PT-Home health PT(if able to receive home health services - uninsured), OT-  Home health OT, SLP-None Projected Equipment Needs: PT-To be determined, OT- To be determined, SLP-None recommended by SLP Equipment Details: PT- , OT-  Patient/family involved in discharge planning: PT- Patient,  OT-Patient, SLP-Patient  MD ELOS: 10-14 days. 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  Presented 02/23/2019 with increasing shortness of breath requiring intubation for airway  protection follow-up critical care medicine for severe respiratory failure requiring ventilatory support related to SIRS COVID-19 infection. Patient developed severe ATN requiring CRRT with prolonged respiratory failure extubated later underwent tracheostomy 03/16/2019 per Dr. Nelda Marseille as well as placement of permacath 03/17/2019 forrenal failure requiring intermittent dialysis. Renal ultrasound showed no hydronephrosis. On 03/18/2019 patient with worsening hypoxemia and placed on intravenous heparin for suspect pulmonary emboli.CT angiogram cannot be obtained at that time due to severe AKI. Transitionedto Eliquis but was discontinued due to drop in hemoglobin required 2 units packed red blood cells and currently remains only on Lovenox with recommendations to continue for 3 months. He was transferred 03/30/2019 [place] to [place] testing negative x2 on 04/02/2019 and 04/03/2019 patient's. Diet has been advanced to regular after initially receiving nasogastric tubefeedsCurrently with a #4 cuffless, plugged. Plan for decannulation.During patient's hospital course developed full-thickness related pressure injuries to bilateral cheeks while recovering from [place]. Wound care nurse follow-up for skin care. Patient's renal function is improving no longer requiring dialysis. Will set goals for Mod I with PT/OT.  Due to the current state of emergency, patients may not be receiving their 3-hours of Medicare-mandated therapy.  See Team Conference Notes for weekly updates to the plan of care

## 2019-04-20 NOTE — Progress Notes (Signed)
[place] for Lovenox Indication: suspected PE  Patient Measurements: Height: 5\' 5"  (165.1 cm) Weight: 142 lb 6.7 oz (64.6 kg) IBW/kg (Calculated) : 61.5  Vital Signs: Temp: 98.3 F (36.8 C) (06/16 0551) Temp Source: Oral (06/16 0551) BP: 116/86 (06/16 0551) Pulse Rate: 94 (06/16 0837)  Labs: Recent Labs    04/19/19 0609 04/20/19 0734  HGB 8.8* 9.3*  HCT 28.4* 30.5*  PLT 429* 390  CREATININE  --  1.05   Estimated Creatinine Clearance: 74.8 mL/min (by C-G formula based on SCr of 1.05 mg/dL).  Medical History: History reviewed. No pertinent past medical history.  Assessment: 9 YOM admitted on 02/23/2019 with ARDS due to COVID-19 who was on IV heparin for an extended period of time and transitioned to apixaban on 5/23 for r/o PE. Patient then experienced a drop in Hgb on 5/24 requiring 2u PRBCs. Apixaban was stopped but with Hgb remaining stable with no s/sx of overt bleeding, IV heparin resumed 5/25. Patient fell in room the evening of 5/26, and heparin stopped at 2300. CT head negative; thus heparin resumed 5/27 ~ 0200. On 5/30, IV heparin transitioned to SQ Lovenox. Pharmacy consulted for dosing. Note, patient developed AKI requiring CRRT, then IHD, now improved and stable.   Hgb 8.8>>9.3, PLT stable   Goal of Therapy:  Anti-Xa level 0.6-1 units/ml 4hrs after LMWH dose given Monitor platelets by anticoagulation protocol: Yes    Plan: Continue Lovenox to 60 mg sq Q 12 Follow up CBC   Kasidi Shanker A. Levada Dy, PharmD, [place] Please utilize Amion for appropriate phone number to reach the unit pharmacist ([place])

## 2019-04-20 NOTE — Progress Notes (Signed)
Decanulated PT at 0840 Pt on 3L  at 97%

## 2019-04-20 NOTE — Plan of Care (Signed)
  Problem: RH SKIN INTEGRITY Goal: RH STG MAINTAIN SKIN INTEGRITY WITH ASSISTANCE Description: STG Maintain Skin Integrity With Assistance. Mod i Outcome: Progressing   Problem: RH SAFETY Goal: RH STG ADHERE TO SAFETY PRECAUTIONS W/ASSISTANCE/DEVICE Description: STG Adhere to Safety Precautions With Assistance/Device. Mod I Outcome: Progressing   Problem: RH KNOWLEDGE DEFICIT GENERAL Goal: RH STG INCREASE KNOWLEDGE OF SELF CARE AFTER HOSPITALIZATION Description: Patient will be able to describe self care following rehab including medications for HTN and T2DM with cues/handouts Outcome: Progressing

## 2019-04-20 NOTE — Progress Notes (Addendum)
[place] PHYSICAL MEDICINE & REHABILITATION PROGRESS NOTE  Subjective/Complaints: Patient seen laying in bed this morning.  He states he slept well overnight.  No reported issues.  He was seen by pulmonary this a.m., plans for decannulation.  ROS: Limited due to language  Objective: Vital Signs: Blood pressure 116/86, pulse 86, temperature 98.3 F (36.8 C), temperature source Oral, resp. rate (!) 21, height 5\' 5"  (1.651 m), weight 64.6 kg, SpO2 98 %. No results found. Recent Labs    04/19/19 0609 04/20/19 0734  WBC 10.0 9.5  HGB 8.8* 9.3*  HCT 28.4* 30.5*  PLT 429* 390   Recent Labs    04/17/19 0854 04/20/19 0734  NA  --  136  K  --  4.0  CL  --  105  CO2  --  22  GLUCOSE  --  232*  BUN  --  26*  CREATININE 1.05 1.05  CALCIUM  --  9.5    Physical Exam: BP 116/86 (BP Location: Left Arm)   Pulse 86   Temp 98.3 F (36.8 C) (Oral)   Resp (!) 21   Ht 5\' 5"  (1.651 m)   Wt 64.6 kg   SpO2 98%   BMI 23.70 kg/m  Constitutional: No distress . Vital signs reviewed. HENT: Normocephalic.  Atraumatic.   Neck: + Plugged trach. Eyes: EOMI. No discharge. Cardiovascular: No JVD. Respiratory: Normal effort.  + [place] GI: Non-distended. Musc: No edema or tenderness in extremities. Neurological: Alert Makes good eye contact with examiner.  Motor: Limited due to language Grossly +/-4/5 throughout Skin: A few scattered abrasions around face Psychiatric: He has a normal mood and affect. Hisbehavior is normal.  Assessment/Plan: 1. Functional deficits secondary to debility status post COVID-19 which require 3+ hours per day of interdisciplinary therapy in a comprehensive inpatient rehab setting.  Physiatrist is providing close team supervision and 24 hour management of active medical problems listed below.  Physiatrist and rehab team continue to assess barriers to discharge/monitor patient progress toward functional and medical goals  Care Tool:  Bathing               Bathing assist       Upper Body Dressing/Undressing Upper body dressing        Upper body assist      Lower Body Dressing/Undressing Lower body dressing            Lower body assist       Toileting Toileting    Toileting assist Assist for toileting: Set up assist(pt used urinal )     Transfers Chair/bed transfer  Transfers assist           Locomotion Ambulation   Ambulation assist              Walk 10 feet activity   Assist           Walk 50 feet activity   Assist           Walk 150 feet activity   Assist           Walk 10 feet on uneven surface  activity   Assist           Wheelchair     Assist               Wheelchair 50 feet with 2 turns activity    Assist            Wheelchair 150 feet activity     Assist  Medical Problem List and Plan: 1.Debilitysecondary to ARDS due to SARS COVID-19 infection. Repeat COVID test x2-  Begin CIR evaluations  Notes reviewed- COVID-19, labs reviewed 2. Antithrombotics: -DVT/anticoagulation:Subcutaneous Lovenox 60 mg every 12 hours for suspected pulmonary emboli. CT angiogram of chest not completed due to AKI. Lower extremity Dopplers negative  Will consider transition for trial Xarelto -antiplatelet therapy: N/A 3. Pain Management:Oxycodone as needed 4. Mood:Klonopin 0.25 mg daily -antipsychotic agents: N/A 5. Neuropsych: This patientiscapable of making decisions on hisown behalf. 6. Skin/Wound Care:Routine skin care 7. Fluids/Electrolytes/Nutrition:Routine in and outs 8.AKI. Status post permacath 03/17/2019. Latest creatinine 1.47. Patient did initially receive intermittent dialysis.   Cr. 1.05 on 6/16  Cont to monitor 9.Acute blood loss anemia. Patient has been transfused during hospital course. Continue iron supplement  Hb 9.3 on 6/16  Cont to monitor 10. Tracheostomy 03/16/2019.    Currently with #4 cuffless trach, currently capped, plan for decannulation today per Pulm 11.Intermittent sinus tachycardia. Continue Lopressor 25 mg twice daily  Monitor with increased mobility 12.Diabetes mellitus.Hemoglobin A1c 5.5. Lantus insulin 12 units twice daily. Monitor closely with increased mobility, adjust regimen as needed  Monitor with increased mobility  LOS: 1 days A FACE TO FACE EVALUATION WAS PERFORMED  Jonathan [name] Lorie Phenix 04/20/2019, 8:39 AM

## 2019-04-20 NOTE — Consult Note (Addendum)
[place] Nurse wound follow up Weekly assessment of previous unstageable pressure injuries to bilat cheeks from proning with ETT while recovering from COVID-19.   Measurement: Left cheek is 80% pink dry scar tissue, healing full thickness wound is .3X.3X.1cm, pink and moist, no further eschar Right cheek is 90% pink dry scar tissue, .1X.1X.1cm healing full thickness wound, no further eschar. These pressure injuries were present on admission to the rehab unt Continue present plan of care with Bactroban to promote moist healing.  No further role for WOC team. Please re-consult if further assistance is needed.  Thank-you,  Julien Girt MSN, [place], [place], [place], [place]

## 2019-04-20 NOTE — Evaluation (Signed)
Physical Therapy Assessment and Plan  Patient Details  Name: Jonathan [name] MRN: [id number] Date of Birth: March 02, 1971  PT Diagnosis: Abnormality of gait, Ataxia, Ataxic gait, Difficulty walking and Muscle weakness Rehab Potential: Good ELOS: 10-14 days   Today's Date: 04/20/2019 PT Individual Time: 0900-1000 PT Individual Time Calculation (min): 60 min    Problem List:  Patient Active Problem List   Diagnosis Date Noted  . Acute blood loss anemia   . Tracheostomy status ([place])   . Tachycardia   . Diabetes mellitus type 2 in nonobese (HCC)   . Acute pulmonary embolism ([place])   . Debility 04/19/2019  . Acute respiratory failure with hypoxia and hypercapnia (HCC)   . Encounter for intubation   . History of ETT   . Status post tracheostomy ([place])   . Tracheostomy care ([place])   . On tube feeding diet   . ATN (acute tubular necrosis) ([place]) 03/12/2019  . AKI (acute kidney injury) ([place])   . Acute respiratory failure with hypoxia ([place])   . COVID-19   . Pressure injury of skin 02/28/2019  . ARDS (adult respiratory distress syndrome) ([place]) 02/23/2019    Past Medical History: History reviewed. No pertinent past medical history. Past Surgical History:  Past Surgical History:  Procedure Laterality Date  . INSERTION OF DIALYSIS CATHETER N/A 03/17/2019   Procedure: INSERTION OF DIALYSIS CATHETER RIGHT INTERNAL JUGULAR vEIN;  Surgeon: Elam Dutch, MD;  Location: 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 Presented 02/23/2019 with increasing shortness of breath requiring intubation for airway protection follow-up critical care medicine for severe respiratory failure requiring ventilatory support related to SIRS COVID-19 infection. Patient developed severe ATN requiring CRRT with prolonged  respiratory failure extubated later underwent tracheostomy 03/16/2019 per Dr. Nelda Marseille as well as placement of permacath 03/17/2019 forrenal failure requiring intermittent dialysis. Renal ultrasound showed no hydronephrosis. On 03/18/2019 patient with worsening hypoxemia and placed on intravenous heparin for suspect pulmonary emboli.CT angiogram cannot be obtained at that time due to severe AKI. Transitionedto Eliquis but was discontinued due to drop in hemoglobin required 2 units packed red blood cells and currently remains only on Lovenox with recommendations to continue for 3 months. Lower extremity Dopplers were negative for DVT. He was transferred 03/30/2019 [place] to [place] testing negative x2 on 04/02/2019 and 04/03/2019 patient's. Diet has been advanced to regular after initially receiving nasogastric tubefeedsCurrently with a #4 cuffless trach PMV as directed plan for capping per pulmonary services. During patient's hospital course developed full-thickness related pressure injuries to bilateral cheeks while recovering from [place]. Wound care nurse follow-up for skin care. Patient's renal function is stabilized latest creatinine 1.47 and no longer requiring dialysis. Patient transferred to CIR on 04/19/2019 .   Patient currently requires min with mobility secondary to muscle weakness, decreased cardiorespiratoy endurance and decreased oxygen support and decreased standing balance and decreased balance strategies.  Prior to hospitalization, patient was independent  with mobility and lived with Other (Comment)(lives with roommates on farm) in a House home.  Home access is 5-6 stepsStairs to enter.  Patient will benefit from skilled PT intervention to maximize safe functional mobility, minimize fall risk and decrease caregiver burden for planned discharge home with intermittent assist.  Anticipate patient will benefit from follow up [facility] at discharge.  PT -  End of  Session Activity Tolerance: Tolerates < 10 min activity with changes in vital signs Endurance Deficit: Yes Endurance Deficit Description: requires up to 6 L/min O2 to maintain spO2 in 90s, 3-4 L/min at rest, moderate increase in work of breathing w/ all mobility PT Assessment Rehab Potential (ACUTE/IP ONLY): Good PT Barriers to Discharge: Decreased caregiver support;Medical stability;Lack of/limited family support PT Barriers to Discharge Comments: Pt from [place], on work [employer], no family in [place], roommates to provide PRN assist PT Patient demonstrates impairments in the following area(s): Balance;Endurance;Motor;Safety PT Transfers Functional Problem(s): Bed Mobility;Bed to Chair;Floor;Car;Furniture PT Locomotion Functional Problem(s): Stairs;Ambulation PT Plan PT Intensity: Minimum of 1-2 x/day ,45 to 90 minutes PT Frequency: 5 out of 7 days PT Duration Estimated Length of Stay: 10-14 days PT Treatment/Interventions: Ambulation/gait training;DME/adaptive equipment instruction;Discharge planning;Functional mobility training;Pain management;Psychosocial support;Splinting/orthotics;Therapeutic Activities;UE/LE Strength taining/ROM;Therapeutic Exercise;UE/LE Coordination activities;Stair training;Skin care/wound management;Patient/family education;Neuromuscular re-education;Functional electrical stimulation;Disease management/prevention;Community reintegration;Balance/vestibular training PT Transfers Anticipated Outcome(s): mod/i PT Locomotion Anticipated Outcome(s): mod/i household gait PT Recommendation Follow Up Recommendations: Home health PT(if able to receive home health services - uninsured) Patient destination: Home([place], until medically ready to return to [place]) Equipment Recommended: To be determined  Skilled Therapeutic Intervention  Pt in supine and agreeable to therapy, denies pain. Pt on 3 L/min O2 at rest, spO2 in 90s. Interpreter present throughout session. Performed  functional mobility as detailed below including bed mobility, transfers, gait, and stair negotiation. Additionally performed car transfer w/ min assist. Frequent and prolonged seated rest breaks throughout session 2/2 increased work of breathing and abnormal vital sign response to activity. HR 90s at rest, up to 130s w/ activity. Required 6 L/min O2 to maintain saturations >88%. Moderate increase in work of breathing w/ all activity, pt endorses feeling SOB but reports it is slowly improving. Discussed w/ nursing O2 requirements for gait. Instructed pt in results of PT evaluation as detailed below, PT POC, rehab potential, rehab goals, and discharge recommendations. Ended session in supine, all needs in reach.  PT Evaluation Precautions/Restrictions Precautions Precautions: Other (comment) Precaution Comments: trach recently decannulated, watch O2 and HR Restrictions Weight Bearing Restrictions: No General   Vital SignsTherapy Vitals Pulse Rate: 94 Resp: 16 Patient Position (if appropriate): Lying Oxygen Therapy SpO2: 97 % O2 Device: Nasal Cannula O2 Flow Rate (L/min): 3 L/min Pain Pain Assessment Pain Scale: 0-10 Pain Score: 0-No pain Home Living/Prior Functioning Home Living Available Help at Discharge: Other (Comment)(roommates able to help PRN) Type of Home: House Home Access: Stairs to enter [employer] of Steps: 5-6 steps Home Layout: One level  Lives With: Other (Comment)(lives with roommates on farm) Prior Function Level of Independence: Independent with basic ADLs;Independent with transfers;Independent with homemaking with ambulation;Independent with gait  Able to Take Stairs?: Yes Vocation: Full time employment Vocation Requirements: in [place] on working [employer], works at [employer] in [place], arrived from [place] in April 2020, 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 prior to going back to [place], will not be required to work on farm Vision/Perception   Perception Perception: Within [place]: Intact  Cognition Overall Cognitive Status: Within Functional Limits for tasks assessed Arousal/Alertness: Awake/alert Orientation Level: Oriented X4 Memory: Appears intact Awareness: Appears intact Problem Solving: Appears intact Safety/Judgment: Appears intact Sensation Sensation Light Touch: Appears Intact(reports some tingling in LEs at night time, unsure of etiology and unable to explain further) Coordination Gross Motor Movements are Fluid and Coordinated: No(mild ataxia) Heel Shin Test: WNL Motor  Motor Motor: Ataxia Motor - Skilled Clinical  Observations: generalized weakness, mild ataxia w/ LE open chain movements  Mobility Bed Mobility Bed Mobility: Rolling Right;Rolling Left;Sit to Supine;Supine to Sit Rolling Right: Supervision/verbal cueing Rolling Left: Supervision/Verbal cueing Supine to Sit: Supervision/Verbal cueing Sit to Supine: Supervision/Verbal cueing Transfers Transfers: Stand to Sit;Sit to Stand;Stand Pivot Transfers Sit to Stand: Contact Guard/Touching assist Stand to Sit: Contact Guard/Touching assist Stand Pivot Transfers: Minimal Assistance - Patient > 75% Transfer (Assistive device): None Locomotion  Gait Ambulation: Yes Gait Assistance: Minimal Assistance - Patient > 75% Gait Distance (Feet): 50 Feet Assistive device: None Gait Gait: Yes Gait Pattern: Impaired Gait Pattern: Decreased trunk rotation;Ataxic;Shuffle Gait velocity: decreased Stairs / Additional Locomotion Stairs: Yes Stairs Assistance: Minimal Assistance - Patient > 75% Stair Management Technique: One rail Left Number of Stairs: 4 Height of Stairs: 6 Wheelchair Mobility Wheelchair Mobility: No  Trunk/Postural Assessment  Cervical Assessment Cervical Assessment: Within Functional Limits Thoracic Assessment Thoracic Assessment: Within Functional Limits Lumbar Assessment Lumbar Assessment: Within  Functional Limits Postural Control Postural Control: Within Functional Limits  Balance Balance Balance Assessed: Yes Static Sitting Balance Static Sitting - Balance Support: No upper extremity supported;Feet supported Static Sitting - Level of Assistance: 5: Stand by assistance Dynamic Sitting Balance Dynamic Sitting - Balance Support: No upper extremity supported;Feet supported Dynamic Sitting - Level of Assistance: 5: Stand by assistance Static Standing Balance Static Standing - Balance Support: No upper extremity supported;During functional activity Static Standing - Level of Assistance: 4: Min assist Dynamic Standing Balance Dynamic Standing - Balance Support: No upper extremity supported;During functional activity Dynamic Standing - Level of Assistance: 4: Min assist Extremity Assessment  RLE Assessment RLE Assessment: Exceptions to [facility] Passive Range of Motion (PROM) Comments: WFL General Strength Comments: Globally 3 to 3+/5 LLE Assessment LLE Assessment: Exceptions to [facility] Passive Range of Motion (PROM) Comments: WFL General Strength Comments: Globally 3 to 3+/5    Refer to Care Plan for Long Term Goals  Recommendations for other services: None   Discharge Criteria: Patient will be discharged from PT if patient refuses treatment 3 consecutive times without medical reason, if treatment goals not met, if there is a change in medical status, if patient makes no progress towards goals or if patient is discharged from hospital.  The above assessment, treatment plan, treatment alternatives and goals were discussed and mutually agreed upon: by patient  Marcus Groll K Odin Mariani 04/20/2019, 10:45 AM

## 2019-04-20 NOTE — Progress Notes (Signed)
Inpatient Rehabilitation  Patient information reviewed and entered into eRehab system by Landynn Dupler M. Arael Piccione, M.A., CCC/SLP, PPS Coordinator.  Information including medical coding, functional ability and quality indicators will be reviewed and updated through discharge.    

## 2019-04-20 NOTE — Progress Notes (Signed)
Patient has wallet to bedside. Patient told through interpreter about [place] valuables policy. Patient reported no family to take wallet home and did not want [place] to take to security. Patient placed wallet in pants pocket. Continue to reinforce education.

## 2019-04-20 NOTE — Evaluation (Signed)
Speech Language Pathology Assessment and Plan  Patient Details  Name: Jonathan [name] MRN: [id number] Date of Birth: 08/06/1971  Evaluation Only  Today's Date: 04/20/2019 SLP Individual Time: 1115-1215 SLP Individual Time Calculation (min): 60 min   Problem List:  Patient Active Problem List   Diagnosis Date Noted  . Acute blood loss anemia   . Tracheostomy status (HCC)   . Tachycardia   . Diabetes mellitus type 2 in nonobese (HCC)   . Acute pulmonary embolism (HCC)   . Debility 04/19/2019  . Acute respiratory failure with hypoxia and hypercapnia (HCC)   . Encounter for intubation   . History of ETT   . Status post tracheostomy (HCC)   . Tracheostomy care (HCC)   . On tube feeding diet   . ATN (acute tubular necrosis) (HCC) 03/12/2019  . AKI (acute kidney injury) (HCC)   . Acute respiratory failure with hypoxia (HCC)   . COVID-19   . Pressure injury of skin 02/28/2019  . ARDS (adult respiratory distress syndrome) (HCC) 02/23/2019   Past Medical History: History reviewed. No pertinent past medical history. Past Surgical History:  Past Surgical History:  Procedure Laterality Date  . 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  Presented 02/23/2019 with increasing shortness of breath requiring intubation for airway protection follow-up critical care medicine for severe respiratory failure requiring ventilatory support related to SIRS COVID-19 infection.  Patient developed severe ATN requiring CRRT with prolonged respiratory failure extubated later underwent tracheostomy 03/16/2019 per Dr. Yacoub. On  03/18/2019 patient with worsening hypoxemia and placed on intravenous heparin for suspect pulmonary emboli.  He was transferred 03/30/2019 from [facility] to [facility].  Repeat COVID testing negative x2 on 04/02/2019 and 04/03/2019 patient's. Diet has been advanced to regular. During patient's hospital course developed full-thickness related pressure injuries to bilateral cheeks while recovering from COVID.   Therapy evaluations completed and patient was admitted for a comprehensive rehab program on 6/15 with decannulation on 04/20/19 after arrival. Cognitive linguistic and bedside swallow evaluations completed on 04/20/19. Pt presents with minimal risk of aspiration when consuming regular diet with thin liquids. Additionally, pt has middle school education in [place] and contiues to present with functional cognitive linguistic abilities as evidenced by ability to perform basic math problems related to wages, demonstrate selective attention, good insight and problem solving, ability to communicate with effectie speech and language and follow multi-step directions. At this time, ST will sign off as pt's abilities are within functional limits.    Skilled Therapeutic Interventions          Skilled treatment session focused on completion of BSE, and cognitive linguistic evaluation. Pt was decannulated this morning and PMSV orders were discontinued. All education was completed and no further services are indicated at this time.    SLP Assessment  Patient does not need any further Speech Lanaguage Pathology Services    Recommendations  SLP Diet Recommendations: Age appropriate regular solids;Thin Liquid Administration via: Cup;Straw Medication Administration: Whole meds with liquid Supervision: Patient able to self feed;Intermittent supervision to cue for compensatory strategies Compensations: Small sips/bites;Slow rate;Minimize environmental distractions Postural Changes and/or Swallow Maneuvers:    Seated upright 90 degrees Oral Care Recommendations: Oral care BID Patient destination: Home Follow up Recommendations: None Equipment Recommended: None recommended by SLP           Pain    Prior Functioning Cognitive/Linguistic Baseline: Information not available Type of Home: House  Lives With: Other (Comment)(Roomate on farm) Education: finished middle school Vocation: Full time employment  Short Term Goals: No short term goals set  Refer to Care Plan for Long Term Goals  Recommendations for other services: None   Discharge Criteria: Patient will be discharged from SLP if patient refuses treatment 3 consecutive times without medical reason, if treatment goals not met, if there is a change in medical status, if patient makes no progress towards goals or if patient is discharged from hospital.  The above assessment, treatment plan, treatment alternatives and goals were discussed and mutually agreed upon: by patient    04/20/2019, 2:31 PM  

## 2019-04-20 NOTE — Progress Notes (Signed)
NAME:  Jonathan [name], MRN:  [id number], DOB:  July 04, 1971, LOS: 1 ADMISSION DATE:  04/19/2019, CONSULTATION DATE: February 23, 2019 REFERRING MD: Regenia Skeeter, CHIEF COMPLAINT: Dyspnea  Brief History   48 year old male admitted on February 23, 2019 with ARDS due to COVID-19.  He has largely recovered from severe ARDS which originally required prone positioning for many days.  Developed acute kidney injury requiring hemodialysis.  Has been liberated from the ventilator as of Mar 25, 2019.    Past Medical History  Diabetes mellitus type 2 Hypertension  Significant Hospital Events   4/21 admitted and immediately proned on inverse ratio PC ventilation with high PEEP to maintain saturation >85%, Started on NO at 40ppm. 4/22- Tocluzimab X 1 4/22- started CVVHD 4/21-4/24 Proned daily 4/28 -4/30 proned daily , tapered NO to off  4/30 -late evening with increased O2 demands . No paralytics required x 24hr . No vent dysyncrony . Calm on Fent /Versed. Off precedex   5/1 - Remains on CRRT , no acute issues overnight --4.8 L I/O Balance since admit . Continues to Prone on/off 16 on and 8 off . -CXR stable diffuse bilateral infiltrates (slightly improved aeration),  Vent .80Fio2 .  5/3 convalescent plasma  Biomarkes improving but severe ARDS persists ad D-dimer still very high (13) without improvement. On IV Heparin gtt. Growing MSSA. SEdation changed to dilaudid gtt. Continues prone 16h and 8h syupine. Currently supine at 80% fio2, peep 18. On TF. STARTED NIMBEX  5/4-5/5-able to stay in the supine position, able to discontinue paralysis and maintain FiO2 0.50, PEEP 8-10.  5/5 hematochezia, bloody gastric aspirate  5/8 followed commands  5/11 extubated  5/12 re-intubated, tracheostomy  5/13 perm cath  5/14 worsening hypoxemia, shock suddenly, RV dilated, no new CXR changes, treated for PE empirically  5/21 doing t-piece trials May 23: Trial off of CVVHD May 25 remains off of  ventilator support since May 21, remains anuric, plan for CVVHD again today  6/15 Capped  6/16 Decannulate  Consults:  PCCM 4/21  Procedures:  4/21 - ETT 7.5 >> 5/12 5/12 Trach >> 4/21 - RIJ CVC [place] [facility] >>5/10 5/10 - CVC >> 5/13 4/22 L IJ HD Cath>5/15 5/13 R IJ Permcath>   Significant Diagnostic Tests:  POC U/S 4/21: Bilateral interstitial pattern with bilateral dense consolidation at bases. Echo shows low-normal LVSF. RV moderately dilated with septal shift.  5/14 Echo> LVEF > 65%, RV dilated, decreased RV function (moderate) with RVSP 60  Micro Data:  Blood cultures -PND SARS-CoV2 02/23/2019 - - POSITIVE HIV 4/21 -neg Quant gold 4/2 1- neg .....................Marland Kitchen resp 4/29 >>rare MSSA and yeast  5/10 blood > neg 5/18 c. Diff > neg  Anti-covid RX and antimicrobial RX  Azithromycin 4/21 >>4/25 Ceftriaxone 4/22 >>4/25 Actemra 4/22 Hydroxychloroquine 4/21 > 4/22 ........................... Ceftriaxone 5/2 >>5/10 Cefepime 5/10>>> 5/12 Zosyn 5/12 >>5/17 Vancomycin 5/10>>>5/17  Fluconazole 5/12 > 5/17   Interim history/subjective:  Tolerated capping overnight, 2L [place] with sats of 93%  Objective   Blood pressure 116/86, pulse 86, temperature 98.3 F (36.8 C), temperature source Oral, resp. rate (!) 21, height 5\' 5"  (1.651 m), weight 64.6 kg, SpO2 98 %.        Intake/Output Summary (Last 24 hours) at 04/20/2019 0819 Last data filed at 04/20/2019 0232 Gross per 24 hour  Intake 240 ml  Output 1300 ml  Net -1060 ml   Filed Weights   04/19/19 1700 04/20/19 0630  Weight: 66.1 kg 64.6 kg    Examination:  General:  Well appearing, NAD HENT: [place]/AT, PERRL, EOM-I and MMM, trach is in a good position PULM: CTA bilaterally CV: RRR, Nl S1/S2 and -M/R/G GI: Soft, NT, ND and +BS MSK: normal bulk and tone Neuro: Alert and interactive, moving all ext to command  I reviewed CXR myself, trach is in a good position  Resolved Hospital Problem list      Assessment & Plan:  Suspected pulmonary embolism: Acute hypoxemia, RV dilation on echo Hemoptysis has resolved Empiric anticoagulation x3 months  Acute respiratory failure with hypoxemia, recovering ARDS from COVID-19: O2 as nasal cannula and titrate for sat of 88-92% If remains hypoxemic may need an ambulatory desaturation study on RA prior to discharge to qualify for home O2 Decannulate, order placed  Discussed with rehab-NP  PCCM will sign off, please call back if nedded.  Labs   CBC: Recent Labs  Lab 04/14/19 0546 04/15/19 0517 04/19/19 0609 04/20/19 0734  WBC 10.0 9.9 10.0 9.5  NEUTROABS  --   --   --  3.9  HGB 7.7* 7.8* 8.8* 9.3*  HCT 24.8* 25.6* 28.4* 30.5*  MCV 94.7 95.2 95.0 95.0  PLT 357 395 429* 121    Basic Metabolic Panel: Recent Labs  Lab 04/14/19 0546 04/15/19 0517 04/17/19 0854  NA 138 137  --   K 3.4* 3.7  --   CL 105 105  --   CO2 23 21*  --   GLUCOSE 105* 107*  --   BUN 33* 38*  --   CREATININE 1.25* 1.47* 1.05  CALCIUM 9.9 9.6  --   PHOS 4.1  --   --    GFR: Estimated Creatinine Clearance: 74.8 mL/min (by C-G formula based on SCr of 1.05 mg/dL). Recent Labs  Lab 04/14/19 0546 04/15/19 0517 04/19/19 0609 04/20/19 0734  WBC 10.0 9.9 10.0 9.5    Liver Function Tests: Recent Labs  Lab 04/14/19 0546  AST 21  ALT 29  ALKPHOS 120  BILITOT 0.3  PROT 6.7  ALBUMIN 2.7*   No results for input(s): LIPASE, AMYLASE in the last 168 hours. No results for input(s): AMMONIA in the last 168 hours.  ABG    Component Value Date/Time   PHART 7.381 03/18/2019 1112   PCO2ART 42.1 03/18/2019 1112   PO2ART 50.0 (L) 03/18/2019 1112   HCO3 25.1 03/18/2019 1112   TCO2 26 03/18/2019 1112   ACIDBASEDEF 1.0 03/16/2019 0450   O2SAT 85.0 03/18/2019 1112     Coagulation Profile: No results for input(s): INR, PROTIME in the last 168 hours.  Cardiac Enzymes: No results for input(s): CKTOTAL, CKMB, CKMBINDEX, TROPONINI in the last 168 hours.   HbA1C: Hgb A1c MFr Bld  Date/Time Value Ref Range Status  03/23/2019 06:15 AM 5.5 4.8 - 5.6 % Final    Comment:    (NOTE) Pre diabetes:          5.7%-6.4% Diabetes:              >6.4% Glycemic control for   <7.0% adults with diabetes    CBG: Recent Labs  Lab 04/19/19 0810 04/19/19 1141 04/19/19 1711 04/19/19 2111 04/20/19 0554  GLUCAP 150* 181* 140* 206* 128*   Rush Farmer, M.D. [facility] Pulmonary/Critical Care Medicine. Pager: [phone]. After hours pager: [phone].

## 2019-04-21 ENCOUNTER — Inpatient Hospital Stay (HOSPITAL_COMMUNITY): Payer: Self-pay | Admitting: Physical Therapy

## 2019-04-21 ENCOUNTER — Inpatient Hospital Stay (HOSPITAL_COMMUNITY): Payer: Self-pay

## 2019-04-21 LAB — GLUCOSE, CAPILLARY
Glucose-Capillary: 116 mg/dL — ABNORMAL HIGH (ref 70–99)
Glucose-Capillary: 115 mg/dL — ABNORMAL HIGH (ref 70–99)
Glucose-Capillary: 179 mg/dL — ABNORMAL HIGH (ref 70–99)
Glucose-Capillary: 213 mg/dL — ABNORMAL HIGH (ref 70–99)

## 2019-04-21 MED ORDER — LEVALBUTEROL TARTRATE 45 MCG/ACT IN AERO
2.0000 | INHALATION_SPRAY | Freq: Two times a day (BID) | RESPIRATORY_TRACT | Status: DC
  Administered 2019-04-21 – 2019-04-24 (×6): 2 via RESPIRATORY_TRACT
  Filled 2019-04-21: qty 15

## 2019-04-21 MED ORDER — IPRATROPIUM BROMIDE HFA 17 MCG/ACT IN AERS
2.0000 | INHALATION_SPRAY | Freq: Two times a day (BID) | RESPIRATORY_TRACT | Status: DC
  Administered 2019-04-21 – 2019-04-24 (×6): 2 via RESPIRATORY_TRACT
  Filled 2019-04-21: qty 12.9

## 2019-04-21 NOTE — Progress Notes (Signed)
Occupational Therapy Session Note  Patient Details  Name: Jonathan [name] MRN: [id number] Date of Birth: Jan 17, 1971  Today's Date: 04/21/2019 OT Individual Time: 1100-1200 OT Individual Time Calculation (min): 60 min    Short Term Goals: Week 1:  OT Short Term Goal 1 (Week 1): Pt will tolerate standing at the sink for BADL tasks for 5 minutes without rest break  OT Short Term Goal 2 (Week 1): Pt will maintain SpO2 above 90% on 4L during BADL tasks. OT Short Term Goal 3 (Week 1): Pt will complete LB dressing with supervision  Skilled Therapeutic Interventions/Progress Updates:    1;1. Pt received in w/c with interpreter present. Pt agreeable to shower. Pt completes stand pivot transfer with CGA and requires CGA for doffing pants in standing. OT educates on energy conservation and seated ADLs. Pt completes bathing and dressing with supervision sit to stand with grab bar. Pt completes standing oral care and hair brushing with supervision. Pt completes 2x10 UB therex with 3# dowel rod seated in w/c for global strengthening required for ADLs. Exited session with pt seated in w/c, call light in reach and all needs met  Therapy Documentation Precautions:  Precautions Precautions: Other (comment) Precaution Comments: trach recently decannulated, watch O2 and HR Restrictions Weight Bearing Restrictions: No General:   Vital Signs: Therapy Vitals Temp: 100 F (37.8 C) Pain: Pain Assessment Pain Scale: 0-10 Pain Score: 0-No pain ADL: ADL Eating: Set up Grooming: Contact guard Upper Body Bathing: Contact guard Lower Body Bathing: Minimal assistance Upper Body Dressing: Contact guard Lower Body Dressing: Minimal assistance Toileting: Contact guard Toilet Transfer: Contact guard Contractor    Praxis   Exercises:   Other Treatments:     Therapy/Group: Individual Therapy  Tonny Branch 04/21/2019, 12:17 PM

## 2019-04-21 NOTE — Progress Notes (Signed)
assesed stoma clean, no bleeding, will continue to monitor

## 2019-04-21 NOTE — Progress Notes (Signed)
Physical Therapy Session Note  Patient Details  Name: Jonathan [name] MRN: [id number] Date of Birth: 14-Mar-1971  Today's Date: 04/21/2019 PT Individual Time: 1300-1410 PT Individual Time Calculation (min): 70 min   Short Term Goals: Week 1:  PT Short Term Goal 1 (Week 1): Pt will ambulate 63' w/ supervision w/ LRAD PT Short Term Goal 2 (Week 1): Pt will perform 30 min of upright activity w/ minimal increase in fatigue PT Short Term Goal 3 (Week 1): Pt will maintain dynamic standing balance w/ supervision  Skilled Therapeutic Interventions/Progress Updates:   Pt received supine in bed and agreeable to PT. Supine>sit transfer with supervision assist and min cues for safety. Stand pivot transfer to [facility] with CGA from PT for safety. PT assessed vital signs on 3L/min ) 2 SpO2 94, HR 89. Pt performed WC mobility 3 x 12f with min assist from PT to prevent L veering into obstacles. Through WC mobility O2 increased to 6L/min and then decreased to 4L/min. SpO2 measured following each at 94-95%. PT instructed pt dynamic standing balance and tolerance while engaged in fine motor/cognitive task of Peg board puzzle on 4L/min x 12 minutes. SpO2 measured intermittently 90-94% with no reports of SOB. Pt also completed 2 puzzles and signed documents while standing on airex pad x 10 mintues with 1 UE support and supervision assist from PT. Gait training with no AD on 6L and 4 L x 30 ft each. SpO2 >94% with each bout of gait training and CGA from PT for safety. Pt returned to room and performed stand pivot transfer to bed with CGA. Sit>supine completed with supervision assist, and left supine in bed with call bell in reach and all needs met.         Therapy Documentation Precautions:  Precautions Precautions: Other (comment) Precaution Comments: trach recently decannulated, watch O2 and HR Restrictions Weight Bearing Restrictions: No    Vital Signs: Therapy Vitals Temp: 98.7 F (37.1 C) Temp  Source: Oral Pain: Pain Assessment Pain Scale: 0-10 Pain Score: 0-No pain  Therapy/Group: Individual Therapy  ALorie Phenix6/17/2020, 2:15 PM

## 2019-04-21 NOTE — Progress Notes (Addendum)
[place] PHYSICAL MEDICINE & REHABILITATION PROGRESS NOTE  Subjective/Complaints: Patient seen sitting up in bed this morning.  He states he slept well overnight.  He was decannulated yesterday.  He denies issues with breathing.  ROS: Limited due to language, but appears to deny chest pain and shortness of breath  Objective: Vital Signs: Blood pressure 116/79, pulse 92, temperature 99 F (37.2 C), temperature source Oral, resp. rate 18, height 5\' 5"  (1.651 m), weight 64.6 kg, SpO2 95 %. No results found. Recent Labs    04/19/19 0609 04/20/19 0734  WBC 10.0 9.5  HGB 8.8* 9.3*  HCT 28.4* 30.5*  PLT 429* 390   Recent Labs    04/20/19 0734  NA 136  K 4.0  CL 105  CO2 22  GLUCOSE 232*  BUN 26*  CREATININE 1.05  CALCIUM 9.5    Physical Exam: BP 116/79 (BP Location: Right Arm)   Pulse 92   Temp 99 F (37.2 C) (Oral)   Resp 18   Ht 5\' 5"  (1.651 m)   Wt 64.6 kg   SpO2 95%   BMI 23.70 kg/m  Constitutional: No distress . Vital signs reviewed. HENT: Normocephalic.  Atraumatic. Neck: + Stoma. Eyes: EOMI.  No discharge. Cardiovascular: No JVD. Respiratory: Normal effort. GI: Non-distended. Musc: No edema or tenderness in extremities. Neurological: Alert Makes good eye contact with examiner.  Motor:  Grossly 4-4+/5 throughout Skin: A few scattered abrasions around face Psychiatric: He has a normal mood and affect. Hisbehavior is normal.  Assessment/Plan: 1. Functional deficits secondary to debility status post COVID-19 which require 3+ hours per day of interdisciplinary therapy in a comprehensive inpatient rehab setting.  Physiatrist is providing close team supervision and 24 hour management of active medical problems listed below.  Physiatrist and rehab team continue to assess barriers to discharge/monitor patient progress toward functional and medical goals  Care Tool:  Bathing    Body parts bathed by patient: Right arm, Chest, Left arm, Abdomen, Front  perineal area, Right upper leg, Left upper leg, Face   Body parts bathed by helper: Left lower leg, Right lower leg, Buttocks     Bathing assist Assist Level: Minimal Assistance - Patient > 75%     Upper Body Dressing/Undressing Upper body dressing   What is the patient wearing?: Pull over shirt    Upper body assist Assist Level: Contact Guard/Touching assist    Lower Body Dressing/Undressing Lower body dressing      What is the patient wearing?: Pants, Underwear/pull up     Lower body assist Assist for lower body dressing: Minimal Assistance - Patient > 75%     Toileting Toileting    Toileting assist Assist for toileting: Minimal Assistance - Patient > 75%     Transfers Chair/bed transfer  Transfers assist     Chair/bed transfer assist level: Contact Guard/Touching assist     Locomotion Ambulation   Ambulation assist      Assist level: Minimal Assistance - Patient > 75% Assistive device: No Device Max distance: 50'   Walk 10 feet activity   Assist     Assist level: Minimal Assistance - Patient > 75% Assistive device: No Device   Walk 50 feet activity   Assist    Assist level: Minimal Assistance - Patient > 75%      Walk 150 feet activity   Assist Walk 150 feet activity did not occur: Safety/medical concerns         Walk 10 feet on uneven surface  activity   Assist Walk 10 feet on uneven surfaces activity did not occur: Safety/medical concerns         Wheelchair     Assist Will patient use wheelchair at discharge?: No   Wheelchair activity did not occur: N/A         Wheelchair 50 feet with 2 turns activity    Assist    Wheelchair 50 feet with 2 turns activity did not occur: N/A       Wheelchair 150 feet activity     Assist Wheelchair 150 feet activity did not occur: N/A          Medical Problem List and Plan: 1.Debilitysecondary to ARDS due to SARS COVID-19 infection. Repeat COVID test  x2-  Continue CIR  Team conference today to discuss current and goals and coordination of care, home and environmental barriers, and discharge planning with nursing, case manager, and therapies.  2. Antithrombotics: -DVT/anticoagulation:Subcutaneous Lovenox 60 mg every 12 hours for suspected pulmonary emboli. CT angiogram of chest not completed due to AKI. Lower extremity Dopplers negative  Will consider transition for trial Xarelto, will discuss with PA. -antiplatelet therapy: N/A 3. Pain Management:Oxycodone as needed 4. Mood:Klonopin 0.25 mg daily -antipsychotic agents: N/A 5. Neuropsych: This patientiscapable of making decisions on hisown behalf. 6. Skin/Wound Care:Routine skin care 7. Fluids/Electrolytes/Nutrition:Routine in and outs 8.AKI. Status post permacath 03/17/2019. Patient did initially receive intermittent dialysis.   Cr. 1.05 on 6/16  Cont to monitor 9.Acute blood loss anemia. Patient has been transfused during hospital course. Continue iron supplement  Hb 9.3 on 6/16  Cont to monitor 10. Tracheostomy 03/16/2019.  Decannulated on 6/16 11.Intermittent sinus tachycardia. Continue Lopressor 25 mg twice daily  Improving on 6/17  Monitor with increased mobility 12.Diabetes mellitus.Hemoglobin A1c 5.5. Lantus insulin 12 units twice daily. Monitor closely with increased mobility, adjust regimen as needed  Labile on 6/17, monitor for trend  Monitor with increased mobility  LOS: 2 days A FACE TO FACE EVALUATION WAS PERFORMED  Jonathan [name] Lorie Phenix 04/21/2019, 7:59 AM

## 2019-04-21 NOTE — Progress Notes (Signed)
Physical Therapy Session Note  Patient Details  Name: Jonathan [name] MRN: [id number] Date of Birth: Jul 23, 1971  Today's Date: 04/21/2019 PT Individual Time: 0905-1000 PT Individual Time Calculation (min): 55 min   Short Term Goals: Week 1:  PT Short Term Goal 1 (Week 1): Pt will ambulate 76' w/ supervision w/ LRAD PT Short Term Goal 2 (Week 1): Pt will perform 30 min of upright activity w/ minimal increase in fatigue PT Short Term Goal 3 (Week 1): Pt will maintain dynamic standing balance w/ supervision  Skilled Therapeutic Interventions/Progress Updates:   Pt in supine and agreeable to therapy, denies pain. Per RN, pt w/ increased temp and coughing this morning. Discussed w/ PA, PA Linna Hoff) gave verbal order to continue w/ therapy as planned. Supine>sit w/ supervision and CGA stand pivot to w/c. Pt on 4 L/min supplemental O2 at rest, spO2 >90%. Worked on endurance and decreasing supplemental O2 use this session. Total assist w/c transport to/from therapy gym. NuStep 2 min x3, see details below of O2 saturations. Needed 2-3 minutes to recover from moderate increase in work of breathing and for O2 and HR to return to baseline. Verbal and visual cues for pursed lip breathing throughout session. Ambulated 50' w/o AD, CGA and on 6 L/min, O2 down to 84% w/ gait. Pt self-propelled w/c back to room w/ BUEs, 75' x2. Ended session in w/c, all needs in reach.   NuStep  1st bout - @ L3, 4 L/min, 77% O2, HR 140s bpm 2nd bout - @ L2, 6 L/min, 87% O2, HR 130s bpm 3rd bout - @ L2, 6 L/min, 84% O2, HR 140s bpm  Therapy Documentation Precautions:  Precautions Precautions: Other (comment) Precaution Comments: trach recently decannulated, watch O2 and HR Restrictions Weight Bearing Restrictions: No Vital Signs: Therapy Vitals Temp: 98.7 F (37.1 C) Temp Source: Oral  Therapy/Group: Individual Therapy  Marcianna Daily K Las[place]ba Stevens 04/21/2019, 1:09 PM

## 2019-04-22 ENCOUNTER — Inpatient Hospital Stay (HOSPITAL_COMMUNITY): Payer: Self-pay | Admitting: Physical Therapy

## 2019-04-22 ENCOUNTER — Inpatient Hospital Stay (HOSPITAL_COMMUNITY): Payer: Self-pay | Admitting: Occupational Therapy

## 2019-04-22 DIAGNOSIS — U071 COVID-19: Secondary | ICD-10-CM

## 2019-04-22 DIAGNOSIS — Z8619 Personal history of other infectious and parasitic diseases: Secondary | ICD-10-CM

## 2019-04-22 DIAGNOSIS — J80 Acute respiratory distress syndrome: Secondary | ICD-10-CM

## 2019-04-22 DIAGNOSIS — R7309 Other abnormal glucose: Secondary | ICD-10-CM

## 2019-04-22 DIAGNOSIS — Z8616 Personal history of COVID-19: Secondary | ICD-10-CM

## 2019-04-22 DIAGNOSIS — R000 Tachycardia, unspecified: Secondary | ICD-10-CM

## 2019-04-22 LAB — GLUCOSE, CAPILLARY
Glucose-Capillary: 123 mg/dL — ABNORMAL HIGH (ref 70–99)
Glucose-Capillary: 110 mg/dL — ABNORMAL HIGH (ref 70–99)
Glucose-Capillary: 192 mg/dL — ABNORMAL HIGH (ref 70–99)
Glucose-Capillary: 143 mg/dL — ABNORMAL HIGH (ref 70–99)

## 2019-04-22 MED ORDER — INSULIN ASPART 100 UNIT/ML ~~LOC~~ SOLN
2.0000 [IU] | Freq: Three times a day (TID) | SUBCUTANEOUS | Status: DC
  Administered 2019-04-22 – 2019-04-24 (×6): 2 [IU] via SUBCUTANEOUS

## 2019-04-22 MED ORDER — RIVAROXABAN 15 MG PO TABS
15.0000 mg | ORAL_TABLET | Freq: Two times a day (BID) | ORAL | Status: DC
  Administered 2019-04-22 – 2019-04-23 (×3): 15 mg via ORAL
  Filled 2019-04-22 (×3): qty 1

## 2019-04-22 MED ORDER — RIVAROXABAN 20 MG PO TABS: 20.0000 mg | ORAL_TABLET | Freq: Every day | ORAL | Status: DC

## 2019-04-22 NOTE — Progress Notes (Signed)
Social Work Patient ID: Jonathan [name], male   DOB: 12-Mar-1971, 48 y.o.   MRN: [id number]    With interpreter assist, have reviewed team conference info with pt who is aware of ELOS 10-14 days.  Plan to revisit this tomorrow with therapies to see if ready to target a d/c date.  Pt pleased with progress but notes he needs to be independent as his roomates will be gone during the day.  Continue to follow.  Joyce Heitman, LCSW

## 2019-04-22 NOTE — Patient Care Conference (Signed)
Inpatient RehabilitationTeam Conference and Plan of Care Update Date: 04/21/2019   Time: 2:30 PM    Patient Name: Jonathan [name] Record Number: [id number]  Date of Birth: 1971/05/14 Sex: Male         Room/Bed: 4M01C/4M01C-01 Payor Info: Payor: MEDICAID POTENTIAL / Plan: MEDICAID POTENTIAL / Product Type: *No Product type* /    Admitting Diagnosis: 6. ABI Team Debility, ARDS; 11-13days  Admit Date/Time:  04/19/2019  3:56 PM Admission Comments: No comment available   Primary Diagnosis:  <principal problem not specified> Principal Problem: <principal problem not specified>  Patient Active Problem List   Diagnosis Date Noted  . Sinus tachycardia   . Labile blood glucose   . History of 2019 novel coronavirus disease (COVID-19)   . Acute respiratory distress syndrome (ARDS) due to COVID-19 virus   . Acute blood loss anemia   . Tracheostomy status ([place])   . Tachycardia   . Diabetes mellitus type 2 in nonobese (HCC)   . Acute pulmonary embolism ([place])   . Debility 04/19/2019  . Acute respiratory failure with hypoxia and hypercapnia (HCC)   . Encounter for intubation   . History of ETT   . Status post tracheostomy ([place])   . Tracheostomy care ([place])   . On tube feeding diet   . ATN (acute tubular necrosis) ([place]) 03/12/2019  . AKI (acute kidney injury) ([place])   . Acute respiratory failure with hypoxia ([place])   . COVID-19   . Pressure injury of skin 02/28/2019  . ARDS (adult respiratory distress syndrome) ([place]) 02/23/2019    Expected Discharge Date: Expected Discharge Date: (10-14 days)  Team Members Present: Physician leading conference: Dr. Delice Lesch Social Worker Present: Lennart Pall, LCSW Nurse Present: Other (comment)(Themecia Zenia Resides, RN) PT Present: Burnard Bunting, PT OT Present: Mariane Masters, OT SLP Present: Charolett Bumpers, SLP PPS Coordinator present : Ileana Ladd, PT     Current Status/Progress Goal Weekly Team [place] secondary  to ARDS due to SARS COVID-19 infection.  Improve mobility, safety, DM, AKI, DVT, supplemental oxygen dependent  See above   Bowel/Bladder             Swallow/Nutrition/ Hydration             ADL's   Min A overall- needs 6L of O2 within BADL tasks, fatigues quickly  Mod I  O2 needs, activity tolerance, UB strengthening, self-care retraining   Mobility   CGA overall, gait 50' w/o AD, limited by endurance/SOB, needs up to 6 L/min supplemental O2  mod/i, household gait  endurance, all functional mobility, balance   Communication             Safety/Cognition/ Behavioral Observations            Pain             Skin              Rehab Goals Patient on target to meet rehab goals: Yes *See Care Plan and progress notes for long and short-term goals.     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  Alone in the home when others are at work.  Teaching needs TBD.   Team Discussion:  decanulated yesterday and doing well.  +DVT - lovenox added.  No major nursing issues - watching temps.  CGA to supervision overall with mod ind goals.  Primary concern is fatigue levels and Oxygen needs (6L).  Significant SOB with activity.  10-14 days LOS and will adjust as he  improves.  Pt very motivated.  Revisions to Treatment Plan:  NA    Continued Need for Acute Rehabilitation Level of Care: The patient requires daily medical management by a physician with specialized training in physical medicine and rehabilitation for the following conditions: Daily direction of a multidisciplinary physical rehabilitation program to ensure safe treatment while eliciting the highest outcome that is of practical value to the patient.: Yes Daily medical management of patient stability for increased activity during participation in an intensive rehabilitation regime.: Yes Daily analysis of laboratory values and/or radiology reports with any subsequent need for medication adjustment of medical intervention for : Pulmonary problems;Diabetes problems;Other   I attest that I was present, lead the team conference, and concur with the assessment and plan of the team.   Lennart Pall 04/22/2019, 4:22 PM    Team conference was held via web/ teleconference due to [place] - 19

## 2019-04-22 NOTE — Progress Notes (Signed)
Occupational Therapy Session Note  Patient Details  Name: Jonathan [name] MRN: [id number] Date of Birth: 1971-07-09  Today's Date: 04/22/2019 OT Individual Time: 1300-1415 OT Individual Time Calculation (min): 75 min    Short Term Goals: Week 1:  OT Short Term Goal 1 (Week 1): Pt will tolerate standing at the sink for BADL tasks for 5 minutes without rest break  OT Short Term Goal 2 (Week 1): Pt will maintain SpO2 above 90% on 4L during BADL tasks. OT Short Term Goal 3 (Week 1): Pt will complete LB dressing with supervision  Skilled Therapeutic Interventions/Progress Updates:    Pt greeted sitting in wc after just returning from the bathroom with nurse tech. Nurse tech thought pt was on O2 during ambulation to the bathroom, but O2 had been turned off-SpO2 was 80% after returning from bathroom. Pt placed on 2L and recovered above 90 with deep breathing. Tried to keep pt on 2L for standing grooming task at the sink, but pt desat to 76%. Pt placed on 4L and eventaully recovered above 90 with rest and deep breathing. Pt tolerated 4L for remainder of session with SpO2 above 90%. Pt completed bathing/dressing tasks at the sink with supervision. B UE thre-ex and endurance with wc propulsion to therapy gym. Pt then ambulated 10 feet to arm bike. Pt completed 5 mins on level 2 x2. - Pt took rest break with HR 124 and SpO2 90-93%. Seated on therapy mat, 3 sets of 10 ball toss with overhead pass and chest pass. Continued UB there-ex using 1 lb dowel rod. Straight arm raises and upright row in standing, then bicep curls in sitting 2/2 fatigue - 3 sets of 10. Marland Kitchen Pt returned to room and left semi-reclined in bed with bed alarm on and needs met.   Therapy Documentation Precautions:  Precautions Precautions: Other (comment) Precaution Comments: trach recently decannulated, watch O2 and HR Restrictions Weight Bearing Restrictions: No Pain: Pain Assessment Pain Scale: 0-10 Pain Score: 0-No pain Faces  Pain Scale: No hurt   Therapy/Group: Individual Therapy  Valma Cava 04/22/2019, 1:28 PM

## 2019-04-22 NOTE — Progress Notes (Signed)
[place] for Lovenox>>Rivaroxaban Indication: suspected PE  Patient Measurements: Height: 5\' 5"  (165.1 cm) Weight: 143 lb 4.8 oz (65 kg) IBW/kg (Calculated) : 61.5  Vital Signs: Temp: 98 F (36.7 C) (06/18 0412) Temp Source: Oral (06/18 0412) Pulse Rate: 88 (06/18 0740)  Labs: Recent Labs    04/20/19 0734  HGB 9.3*  HCT 30.5*  PLT 390  CREATININE 1.05   Estimated Creatinine Clearance: 74.8 mL/min (by C-G formula based on SCr of 1.05 mg/dL).  Medical History: History reviewed. No pertinent past medical history.  Assessment: 82 YOM admitted on 02/23/2019 with ARDS due to COVID-19 who was on IV heparin for an extended period of time and transitioned to apixaban on 5/23 for r/o PE. Patient then experienced a drop in Hgb on 5/24 requiring 2u PRBCs. Apixaban was stopped but with Hgb remaining stable with no s/sx of overt bleeding, IV heparin resumed 5/25. Patient fell in room the evening of 5/26, and heparin stopped at 2300. CT head negative; thus heparin resumed 5/27 ~ 0200. On 5/30, IV heparin transitioned to SQ Lovenox. Pharmacy consulted for dosing. Note, patient developed AKI requiring CRRT, then IHD, now improved and stable.   MD wishes to transition to Po xarelto dosing. Patient has had 15 days of SQ enoxaparin, therefore will give 6 days of xarelto 15mg  BID and then transition to 20mg  daily  Hgb 8.8>>9.3, PLT stable   Goal of Therapy:  Monitor platelets by anticoagulation protocol: Yes    Plan: Discontinue Lovenox to 60 mg sq Q 12 Start rivaroxaban 15mg  BID x 6 days, then 20mg  daily thereafter.  Follow up CBC   Jashawna Reever A. Levada Dy, PharmD, [place] Please utilize Amion for appropriate phone number to reach the unit pharmacist ([place])

## 2019-04-22 NOTE — Progress Notes (Signed)
Physical Therapy Session Note  Patient Details  Name: Jonathan [name] MRN: [id number] Date of Birth: 12/16/1970  Today's Date: 04/22/2019 PT Individual Time: 1007-1219 AND 1100-1155 PT Individual Time Calculation (min): 70 min AND 55 min  Short Term Goals: Week 1:  PT Short Term Goal 1 (Week 1): Pt will ambulate 85' w/ supervision w/ LRAD PT Short Term Goal 2 (Week 1): Pt will perform 30 min of upright activity w/ minimal increase in fatigue PT Short Term Goal 3 (Week 1): Pt will maintain dynamic standing balance w/ supervision  Skilled Therapeutic Interventions/Progress Updates:   Session 1:  Pt in supine and agreeable to therapy, denies pain. Interpreter not present initially, however pt able to follow visual gestures appropriately and therapist able to converse in simple Spanish. Supine>sit w/ supervision and CGA stand pivot to w/c. Pt on 2 L/min at rest, O2 >90% and HR 100 bpm. Pt self-propelled w/c to therapy gym on 3 L/min to work on endurance, O2 down to 87% and HR 118 bpm. Interpreter present once in therapy gym. Performed Berg Balance Scale as detailed below and explained significance of results to pt including increased fall risk. O2  98% on 4 L/min during test. Worked on functional LE strengthening and endurance remainder of session. Performed BLE exercises as listed below w/ CGA for balance/safety. Frequent seated rest breaks throughout session 2/2 fatigue and increased work of breathing. Remained on 4 L/min supplemental O2, O2 >90% during exercises. Pt 5/10 on modified Borg Scale during exercises. Ambulated 50' x4 on 6 L/min w/ CGA-close supervision, O2 > 96% and HR 123 bpm during gait. Ambulated 50' on 4 L/min, O2 >92% and HR 120 bpm. Pt noticeably less short of breath today and endorses feeling stronger overall. Total assist w/c transport back to room, ended session in w/c and all needs in reach.   BLE strengthening:  -standing mini squats 3x10 -standing knee marches  3x5 -LAQs 3x10 -seated knee marches 2x10 -seated heel/toe raises 2x10   Session 2:  Pt in w/c and agreeable to therapy, no c/o pain. Pt self-propelled w/c to/from day room w/ BUEs to work on endurance training. 1 rest break each way 2/2 increased work of breathing. NuStep @ level 3 w/ all extremities to work on endurance, 3 min x3. Pt on 4 L/min supplemental O2 throughout session, >92% when monitored during NuStep, much improved from yesterday's session with this therapist. Moderate increase in work of breathing w/ NuStep, resolves w/ 2-3 min rest break in between bouts. Ongoing verbal and visual cues for pursed lip breathing during activity. Ambulated 8' w/o AD, close supervision on 4 L/min O2. Returned to room via w/c, ended session in w/c and all needs in reach.   Pt able to return to 2 L/min at rest safely at the end of each session, spO2 > 90%, HR 90s bpm.   Therapy Documentation Precautions:  Precautions Precautions: Other (comment) Precaution Comments: trach recently decannulated, watch O2 and HR Restrictions Weight Bearing Restrictions: No Vital Signs: Therapy Vitals Pulse Rate: 88 Resp: 20 Patient Position (if appropriate): Sitting Oxygen Therapy SpO2: 97 % O2 Device: Nasal Cannula O2 Flow Rate (L/min): 2 L/min Balance: Balance Balance Assessed: Yes Standardized Balance Assessment Standardized Balance Assessment: Berg Balance Test Berg Balance Test Sit to Stand: Able to stand without using hands and stabilize independently Standing Unsupported: Able to stand 2 minutes with supervision Sitting with Back Unsupported but Feet Supported on Floor or Stool: Able to sit safely and securely 2 minutes Stand  to Sit: Sits safely with minimal use of hands Transfers: Able to transfer safely, minor use of hands Standing Unsupported with Eyes Closed: Able to stand 10 seconds with supervision Standing Ubsupported with Feet Together: Able to place feet together independently and stand  for 1 minute with supervision From Standing, Reach Forward with Outstretched Arm: Can reach forward >12 cm safely (5") From Standing Position, Pick up Object from Floor: Able to pick up shoe, needs supervision From Standing Position, Turn to Look Behind Over each Shoulder: Looks behind from both sides and weight shifts well Turn 360 Degrees: Able to turn 360 degrees safely but slowly Standing Unsupported, Alternately Place Feet on Step/Stool: Able to complete 4 steps without aid or supervision Standing Unsupported, One Foot in Front: Able to take small step independently and hold 30 seconds Standing on One Leg: Tries to lift leg/unable to hold 3 seconds but remains standing independently Total Score: 42  Therapy/Group: Individual Therapy  Kaley Jutras Clent Demark 04/22/2019, 10:31 AM

## 2019-04-22 NOTE — Progress Notes (Addendum)
[facility] PHYSICAL MEDICINE & REHABILITATION PROGRESS NOTE  Subjective/Complaints: Patient seen sitting up in bed this morning.  He states he slept well overnight.  He is progressing with therapies.  He still requires supplemental oxygen and is limited by endurance.  ROS: Limited due to language, but appears to deny chest pain and shortness of breath  Objective: Vital Signs: Blood pressure 117/76, pulse 88, temperature 98 F (36.7 C), temperature source Oral, resp. rate 20, height 5\' 5"  (1.651 m), weight 65 kg, SpO2 97 %. No results found. Recent Labs    04/20/19 0734  WBC 9.5  HGB 9.3*  HCT 30.5*  PLT 390   Recent Labs    04/20/19 0734  NA 136  K 4.0  CL 105  CO2 22  GLUCOSE 232*  BUN 26*  CREATININE 1.05  CALCIUM 9.5    Physical Exam: BP 117/76 (BP Location: Right Arm)   Pulse 88   Temp 98 F (36.7 C) (Oral)   Resp 20   Ht 5\' 5"  (1.651 m)   Wt 65 kg   SpO2 97%   BMI 23.85 kg/m  Constitutional: No distress . Vital signs reviewed. HENT: Normocephalic.  Atraumatic. Neck: + Stoma. Eyes: EOMI.  No discharge. Cardiovascular: No JVD. Respiratory: Normal effort.  + . GI: Non-distended. Musc: No edema or tenderness in extremities. Neurological: Alert Makes good eye contact with examiner.  Motor:  Grossly 4-4+/5 throughout, stable Skin: A few scattered abrasions around face Psychiatric: He has a normal mood and affect. Hisbehavior is normal.  Assessment/Plan: 1. Functional deficits secondary to debility status post COVID-19 which require 3+ hours per day of interdisciplinary therapy in a comprehensive inpatient rehab setting.  Physiatrist is providing close team supervision and 24 hour management of active medical problems listed below.  Physiatrist and rehab team continue to assess barriers to discharge/monitor patient progress toward functional and medical goals  Care Tool:  Bathing    Body parts bathed by patient: Right arm, Chest, Left arm,  Abdomen, Front perineal area, Right upper leg, Left upper leg, Face   Body parts bathed by helper: Left lower leg, Right lower leg, Buttocks     Bathing assist Assist Level: Minimal Assistance - Patient > 75%     Upper Body Dressing/Undressing Upper body dressing   What is the patient wearing?: Pull over shirt    Upper body assist Assist Level: Contact Guard/Touching assist    Lower Body Dressing/Undressing Lower body dressing      What is the patient wearing?: Pants, Underwear/pull up     Lower body assist Assist for lower body dressing: Minimal Assistance - Patient > 75%     Toileting Toileting    Toileting assist Assist for toileting: Contact Guard/Touching assist     Transfers Chair/bed transfer  Transfers assist     Chair/bed transfer assist level: Contact Guard/Touching assist     Locomotion Ambulation   Ambulation assist      Assist level: Contact Guard/Touching assist Assistive device: No Device Max distance: 30   Walk 10 feet activity   Assist     Assist level: Contact Guard/Touching assist Assistive device: No Device   Walk 50 feet activity   Assist    Assist level: Contact Guard/Touching assist Assistive device: No Device    Walk 150 feet activity   Assist Walk 150 feet activity did not occur: Safety/medical concerns         Walk 10 feet on uneven surface  activity   Assist Walk  10 feet on uneven surfaces activity did not occur: Safety/medical concerns         Wheelchair     Assist Will patient use wheelchair at discharge?: No   Wheelchair activity did not occur: N/A  Wheelchair assist level: Minimal Assistance - Patient > 75% Max wheelchair distance: 150    Wheelchair 50 feet with 2 turns activity    Assist    Wheelchair 50 feet with 2 turns activity did not occur: N/A   Assist Level: Minimal Assistance - Patient > 75%   Wheelchair 150 feet activity     Assist Wheelchair 150 feet activity  did not occur: N/A   Assist Level: Minimal Assistance - Patient > 75%      Medical Problem List and Plan: 1.Debilitysecondary to ARDS due to SARS COVID-19 infection. Repeat COVID test x2-  Continue CIR 2. Antithrombotics: -DVT/anticoagulation:Subcutaneous Lovenox 60 mg every 12 hours for suspected pulmonary emboli. CT angiogram of chest not completed due to AKI. Lower extremity Dopplers negative  Will transition to Xarelto and monitor hemoglobin as patient had drop requiring PRBCs when transitioned to Eliquis previously.  Discussed with pharmacy. -antiplatelet therapy: N/A 3. Pain Management:Oxycodone as needed 4. Mood:Klonopin 0.25 mg daily -antipsychotic agents: N/A 5. Neuropsych: This patientiscapable of making decisions on hisown behalf. 6. Skin/Wound Care:Routine skin care 7. Fluids/Electrolytes/Nutrition:Routine in and outs 8.AKI. Status post permacath 03/17/2019. Patient did initially receive intermittent dialysis.   Cr. 1.05 on 6/16  Cont to monitor 9.Acute blood loss anemia. Patient has been transfused during hospital course. Continue iron supplement  Hb 9.3 on 6/16  Cont to monitor 10. Tracheostomy 03/16/2019.  Decannulated on 6/16 11.Intermittent sinus tachycardia.   Continue Lopressor 25 mg twice daily  Secondary to endurance  Monitor with increased mobility 12.Diabetes mellitus.Hemoglobin A1c 5.5.   Lantus insulin 12 units twice daily.   NovoLog 2 units 3 times daily started on 6/18  Labile on 6/17, monitor for trend  Monitor with increased mobility  LOS: 3 days A FACE TO FACE EVALUATION WAS PERFORMED   Lorie Phenix 04/22/2019, 8:31 AM

## 2019-04-23 ENCOUNTER — Inpatient Hospital Stay (HOSPITAL_COMMUNITY): Payer: Self-pay | Admitting: Occupational Therapy

## 2019-04-23 ENCOUNTER — Inpatient Hospital Stay (HOSPITAL_COMMUNITY): Payer: Self-pay | Admitting: Physical Therapy

## 2019-04-23 LAB — GLUCOSE, CAPILLARY
Glucose-Capillary: 129 mg/dL — ABNORMAL HIGH (ref 70–99)
Glucose-Capillary: 146 mg/dL — ABNORMAL HIGH (ref 70–99)
Glucose-Capillary: 160 mg/dL — ABNORMAL HIGH (ref 70–99)
Glucose-Capillary: 96 mg/dL (ref 70–99)

## 2019-04-23 MED ORDER — RIVAROXABAN 15 MG PO TABS
15.0000 mg | ORAL_TABLET | Freq: Two times a day (BID) | ORAL | Status: AC
  Administered 2019-04-23: 15 mg via ORAL
  Filled 2019-04-23: qty 1

## 2019-04-23 MED ORDER — RIVAROXABAN 20 MG PO TABS
20.0000 mg | ORAL_TABLET | Freq: Every day | ORAL | Status: DC
  Administered 2019-04-24 – 2019-04-29 (×6): 20 mg via ORAL
  Filled 2019-04-23 (×7): qty 1

## 2019-04-23 NOTE — Progress Notes (Signed)
Physical Therapy Session Note  Patient Details  Name: Jonathan [name] MRN: [id number] Date of Birth: 1971-02-11  Today's Date: 04/23/2019 PT Individual Time: 0900-1000 AND 1100-1155 PT Individual Time Calculation (min): 60 min AND 55 min  Short Term Goals: Week 1:  PT Short Term Goal 1 (Week 1): Pt will ambulate 87' w/ supervision w/ LRAD PT Short Term Goal 2 (Week 1): Pt will perform 30 min of upright activity w/ minimal increase in fatigue PT Short Term Goal 3 (Week 1): Pt will maintain dynamic standing balance w/ supervision  Skilled Therapeutic Interventions/Progress Updates:   Session 1:  Pt in supine and agreeable to therapy, no c/o pain. Interpreter present throughout session. Worked on endurance this session and all activities performed w/o close supervision, no overt LOB. Ambulated to day room in 3 bouts, 2 of which on 4 L/min O2, spO2 remained >92% during gait. Mild increase in work of breathing, resolves w/ rest. Last gait bout on 3 L/min, desat to 84%, returns to >90% on 3 L within 30 sec. Performed NuStep @ level 3 as detailed below, ultimately needed to turn up to 6 L/min to perform w/o desaturation. Verbal and visual cues for pursed lip breathing throughout. Self-propelled w/c to therapy gym. Worked on dynamic standing balance in therapy gym, stood to toss and kick ball w/ close supervision. Pt correcting 1 LOB w/ touching armrest of chair. Ambulated back to room on 6 L/min, ended session in w/c and all needs in reach. Pt safely back down to 2 L/min supplemental O2 at rest.   -3 min NuStep, 4 L/min down to 83% O2 -Returned to 96% w/ rest break  -2 min NuStep, 4 L/min down to 85% O2 -Returned to >90% w/ brief rest break -4 min NuStep, 6 L/min and O2 >95%  Session 2:  Pt in w/c and agreeable to therapy, no c/o pain. Ambulated to/from therapy gym w/ supervision on 6 L/min O2. Worked on dynamic standing balance this session while performing tasks in airex pad to work on ankle  and hip balance strategies. Stood to toss horseshoes w/ progressively lower BOS, CGA. Additionally picked up horseshoes from ground w/ CGA for safety, no LOB. UE reaching tasks to emphasize anterior and lateral weight shifting as well as squatting to pin clothespins. CGA for this task as well, no overt LOB. Decreasing BOS w/ this task as well. Standing balance tasks performed on 4 L/min O2, spO2 >88% throughout. Returned to room and provided supervision to change into paper scrubs. Pt requesting assistance w/ washing clothes as he has no family to assist with this. Ended session in w/c, all needs in reach.   Therapy Documentation Precautions:  Precautions Precautions: Other (comment) Precaution Comments: trach recently decannulated, watch O2 and HR Restrictions Weight Bearing Restrictions: No Vital Signs: Therapy Vitals Temp: 98 F (36.7 C) Temp Source: Oral Pulse Rate: 88 Resp: 18 BP: (!) 134/94 Patient Position (if appropriate): Lying Oxygen Therapy SpO2: 95 % O2 Device: Nasal Cannula O2 Flow Rate (L/min): 2 L/min  Therapy/Group: Individual Therapy  Summer Parthasarathy K Raul Torrance 04/23/2019, 10:01 AM

## 2019-04-23 NOTE — Progress Notes (Signed)
Social Work  Social Work Assessment and Plan  Patient Details  Name: Jonathan [name] MRN: [id number] Date of Birth: 11/12/1970  Today's Date: 04/22/2019  Problem List:  Patient Active Problem List   Diagnosis Date Noted  . Sinus tachycardia   . Labile blood glucose   . History of 2019 novel coronavirus disease (COVID-19)   . Acute respiratory distress syndrome (ARDS) due to COVID-19 virus   . Acute blood loss anemia   . Tracheostomy status ([place])   . Tachycardia   . Diabetes mellitus type 2 in nonobese (H[facility])   . Acute pulmonary embolism ([place])   . Debility 04/19/2019  . Acute respiratory failure with hypoxia and hypercapnia ([facility])   . Encounter for intubation   . History of ETT   . Status post tracheostomy ([place])   . Tracheostomy care ([place])   . On tube feeding diet   . ATN (acute tubular necrosis) ([place]) 03/12/2019  . AKI (acute kidney injury) ([place])   . Acute respiratory failure with hypoxia ([place])   . COVID-19   . Pressure injury of skin 02/28/2019  . ARDS (adult respiratory distress syndrome) ([place]) 02/23/2019   Past Medical History: History reviewed. No pertinent past medical history. Past Surgical History:  Past Surgical History:  Procedure Laterality Date  . INSERTION OF DIALYSIS CATHETER N/A 03/17/2019   Procedure: INSERTION OF DIALYSIS CATHETER RIGHT INTERNAL JUGULAR vEIN;  Surgeon: Elam Dutch, MD;  Location: [facility] OR;  Service: Vascular;  Laterality: N/A;   Social History:  reports that he has never smoked. He has never used smokeless tobacco. He reports previous alcohol use. He reports previous drug use.  Family / Support Systems Marital Status: Married Patient Roles: Spouse, Partner Spouse/Significant Other: wife and children are in [place];  wife, Jonathan [name] @ [phone] Other Supports: employer, [name] @ [phone] Anticipated Caregiver: other workers intermittently Ability/Limitations of Caregiver: intermittent Caregiver Availability:  Intermittent Family Dynamics: As noted, pt's family living in [place].  He has been able to speak with them and keep them updated on his progress.  Social History Preferred language: Spanish Religion:  Cultural Background: Pt from [place] and in this area on [employer] worker status through November Read: [employer]) Write: Yes Legal History/Current Legal Issues: None Guardian/Conservator: None - per MD, pt is capable of making decisions on his own behalf.   Abuse/Neglect Abuse/Neglect Assessment Can Be Completed: Yes Physical Abuse: Denies Verbal Abuse: Denies Sexual Abuse: Denies Exploitation of patient/patient's resources: Denies Self-Neglect: Denies  Emotional Status Pt's affect, behavior and adjustment status: Patient very pleasant and completes assessment interview without any difficulty via interpreter assistance.  Patient admits he is concerned about his medical bill and whether his employer will allow him to return to the housing.  Provided reassurance to these concerns are being addressed and that employer has confirmed that he is welcome back and allowed to stay in the housing even if he is not able to work.  Patient denies any significant emotional distress, however, speaks of his family and has enjoyed being able to communicate with them more in recent days.  We will monitor mood and refer for neuropsychology support if indicated. Recent Psychosocial Issues: None Psychiatric History: None Substance Abuse History: None  Patient / Family Perceptions, Expectations & Goals Pt/Family understanding of illness & functional limitations: Patient and family with good, general understanding of his current level of debility following COVID illness and for CIR. 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  Anticipated changes in roles/activities/participation: Minimal change anticipated, however, other roommates will need to  possibly offer some assistance at times. Pt/family expectations/goals: "I just hope I can stay at the farm.  Work if I can."  [employer]: None Premorbid Home Care/DME Agencies: None Transportation available at discharge: Yes Resource referrals recommended: Neuropsychology  Discharge Planning Living Arrangements: Non-relatives/Friends Support Systems: Spouse/significant other, Children, Friends/neighbors Type of Residence: Private residence Insurance Resources: Self-pay(Possibly Worker's Comp.) Museum/gallery curator Resources: Employment Museum/gallery curator Screen Referred: Previously completed Living Expenses: (Currently lives in housing located on the farm where he is employed.) Money Management: Patient Does the patient have any problems obtaining your medications?: Yes (Describe)(No insurance) Home Management: Responsibilities shared among roommates Patient/Family Preliminary Plans: Patient to discharge home with roommates in housing provided on farm. Social Work Anticipated Follow Up Needs: HH/OP DC Planning Additional Notes/Comments: 12 - 14 days  Clinical Impression Very pleasant, Spanish-speaking gentleman who is living temporarily on a local farm to work when he became ill with COVID-19.  On CIR now due to debility with plans to return to housing on the farm at discharge.  Family living in [place] and he has been able to communicate and keep them updated on progress.  Patient denies any significant emotional distress, however, has concerns about financial issues.  Team anticipating modified independent goals.  Will involve interpreter for all therapy sessions.  Social work to follow for support and discharge planning needs.  Jonathan [name] 04/22/2019, 5:10 PM

## 2019-04-23 NOTE — Progress Notes (Signed)
[facility] PHYSICAL MEDICINE & REHABILITATION PROGRESS NOTE  Subjective/Complaints: Patient seen sitting up in bed this morning.  He states he slept well overnight.  He denies complaints.  ROS: Limited due to language, but appears to deny chest pain and shortness of breath  Objective: Vital Signs: Blood pressure (!) 134/94, pulse 88, temperature 98 F (36.7 C), temperature source Oral, resp. rate 18, height 5\' 5"  (1.651 m), weight 65.4 kg, SpO2 96 %. No results found. No results for input(s): WBC, HGB, HCT, PLT in the last 72 hours. No results for input(s): NA, K, CL, CO2, GLUCOSE, BUN, CREATININE, CALCIUM in the last 72 hours.  Physical Exam: BP (!) 134/94 (BP Location: Left Arm)   Pulse 88   Temp 98 F (36.7 C) (Oral)   Resp 18   Ht 5\' 5"  (1.651 m)   Wt 65.4 kg   SpO2 96%   BMI 23.99 kg/m  Constitutional: No distress . Vital signs reviewed. HENT: Normocephalic.  Atraumatic. Neck: + Stoma. Eyes: EOMI.  No discharge. Cardiovascular: No JVD. Respiratory: Normal effort.  + [place]. GI: Non-distended. Musc: No edema or tenderness in extremities. Neurological: Alert Makes good eye contact with examiner.  Motor:  Grossly 4-4+/5 throughout, unchanged Skin: A few scattered abrasions around face Psychiatric: He has a normal mood and affect. Hisbehavior is normal.  Assessment/Plan: 1. Functional deficits secondary to debility status post COVID-19 which require 3+ hours per day of interdisciplinary therapy in a comprehensive inpatient rehab setting.  Physiatrist is providing close team supervision and 24 hour management of active medical problems listed below.  Physiatrist and rehab team continue to assess barriers to discharge/monitor patient progress toward functional and medical goals  Care Tool:  Bathing    Body parts bathed by patient: Right arm, Chest, Left arm, Abdomen, Front perineal area, Right upper leg, Left upper leg, Face   Body parts bathed by helper: Left  lower leg, Right lower leg, Buttocks     Bathing assist Assist Level: Minimal Assistance - Patient > 75%     Upper Body Dressing/Undressing Upper body dressing   What is the patient wearing?: Pull over shirt    Upper body assist Assist Level: Contact Guard/Touching assist    Lower Body Dressing/Undressing Lower body dressing      What is the patient wearing?: Pants, Underwear/pull up     Lower body assist Assist for lower body dressing: Minimal Assistance - Patient > 75%     Toileting Toileting    Toileting assist Assist for toileting: Contact Guard/Touching assist     Transfers Chair/bed transfer  Transfers assist     Chair/bed transfer assist level: Contact Guard/Touching assist     Locomotion Ambulation   Ambulation assist      Assist level: Contact Guard/Touching assist Assistive device: No Device Max distance: 50'   Walk 10 feet activity   Assist     Assist level: Contact Guard/Touching assist Assistive device: No Device   Walk 50 feet activity   Assist    Assist level: Contact Guard/Touching assist Assistive device: No Device    Walk 150 feet activity   Assist Walk 150 feet activity did not occur: Safety/medical concerns         Walk 10 feet on uneven surface  activity   Assist Walk 10 feet on uneven surfaces activity did not occur: Safety/medical concerns         Wheelchair     Assist Will patient use wheelchair at discharge?: No   Wheelchair  activity did not occur: N/A  Wheelchair assist level: Minimal Assistance - Patient > 75% Max wheelchair distance: 150    Wheelchair 50 feet with 2 turns activity    Assist    Wheelchair 50 feet with 2 turns activity did not occur: N/A   Assist Level: Minimal Assistance - Patient > 75%   Wheelchair 150 feet activity     Assist Wheelchair 150 feet activity did not occur: N/A   Assist Level: Minimal Assistance - Patient > 75%      Medical Problem List  and Plan: 1.Debilitysecondary to ARDS due to SARS COVID-19 infection. Repeat COVID test x2-  Continue CIR 2. Antithrombotics: -DVT/anticoagulation:Subcutaneous Lovenox 60 mg every 12 hours for suspected pulmonary emboli. CT angiogram of chest not completed due to AKI. Lower extremity Dopplers negative  Transition to Xarelto, will order CBC for tomorrow to monitor hemoglobin due to need for transfusion with trial of Eliquis -antiplatelet therapy: N/A 3. Pain Management:Oxycodone as needed 4. Mood:Klonopin 0.25 mg daily -antipsychotic agents: N/A 5. Neuropsych: This patientiscapable of making decisions on hisown behalf. 6. Skin/Wound Care:Routine skin care 7. Fluids/Electrolytes/Nutrition:Routine in and outs 8.AKI. Status post permacath 03/17/2019. Patient did initially receive intermittent dialysis.   Cr. 1.05 on 6/16, labs ordered for Monday  Cont to monitor 9.Acute blood loss anemia. Patient has been transfused during hospital course. Continue iron supplement  Hb 9.3 on 6/16, labs ordered for tomorrow  Cont to monitor 10. Respiratory failure   Tracheostomy 03/16/2019.  Decannulated on 6/16  Wean supplemental oxygen as tolerated 11.Intermittent sinus tachycardia.   Continue Lopressor 25 mg twice daily  Secondary to endurance, persistent on 6/19  Monitor with increased mobility 12.Diabetes mellitus.Hemoglobin A1c 5.5.   Lantus insulin 12 units twice daily.   NovoLog 2 units 3 times daily started on 6/18  Labile, but stabilizing on 6/18  Monitor with increased mobility  LOS: 4 days A FACE TO FACE EVALUATION WAS PERFORMED  Jonathan [name] Lorie Phenix 04/23/2019, 8:42 AM

## 2019-04-23 NOTE — Progress Notes (Signed)
Occupational Therapy Session Note  Patient Details  Name: Jonathan [name] MRN: [id number] Date of Birth: Apr 01, 1971  Today's Date: 04/23/2019 OT Individual Time: 1300-1413 OT Individual Time Calculation (min): 73 min    Short Term Goals: Week 1:  OT Short Term Goal 1 (Week 1): Pt will tolerate standing at the sink for BADL tasks for 5 minutes without rest break  OT Short Term Goal 2 (Week 1): Pt will maintain SpO2 above 90% on 4L during BADL tasks. OT Short Term Goal 3 (Week 1): Pt will complete LB dressing with supervision  Skilled Therapeutic Interventions/Progress Updates:    Pt greeted seated in wc and agreeable to OT treatment session. Pt reports need to go to the bathroom. Pt placed on 4L to ambulate to bathroom with supervision. Pt stood at the sink to wash his hands prior to resting. SpO2 98% s/p ambulation. Pt then ambulated to therapy gym with close supervision. Worked on standing balance/endurance standing on foam. Pt tolerated 4, 5 minute intervals of standing activities including pipe tree puzzle and peg board puzzle. Incorporated trunk rotation for balance challenge to reach behind pt to grasp pegs on R and L side. Pt ambulated back to room in similar fashion. Pt doffed clothing on shower chair, then completed bathing tasks with close supervision and education provided for energy conservation techniques and rest breaks. Pt's SpO2 after showering was 92% on 4L, but HR was 128. Pt needed extended rest break to bring HR back down under 120. Dressing completed with supervision. Standing balance/endurance standing at the sink for grooming tasks, then pt sat EOB to rest. Pt had desatted to 79% on 4L, pt needed 6L, rest, and deep breathing, to recover over 90. Pt returned to bed and left on 3L with SpO2 93% and HR 117. Bed alarm on and needs met.   Therapy Documentation Precautions:  Precautions Precautions: Other (comment) Precaution Comments: trach recently decannulated, watch O2  and HR Restrictions Weight Bearing Restrictions: No Pain:  denies pain  Therapy/Group: Individual Therapy  Valma Cava 04/23/2019, 1:24 PM

## 2019-04-23 NOTE — Care Management (Signed)
Inpatient [facility] Individual Statement of Services  Patient Name:  Jonathan [name]  Date:  04/23/2019  Welcome to the [facility].  Our goal is to provide you with an individualized program based on your diagnosis and situation, designed to meet your specific needs.  With this comprehensive rehabilitation program, you will be expected to participate in at least 3 hours of rehabilitation therapies Monday-Friday, with modified therapy programming on the weekends.  Your rehabilitation program will include the following services:  Physical Therapy (PT), Occupational Therapy (OT), Speech Therapy (ST), 24 hour per day rehabilitation nursing, Therapeutic Recreaction (TR), Neuropsychology, Case Management (Social Worker), Rehabilitation Medicine, Nutrition Services, Airline pilot and Pharmacy Services  Weekly team conferences will be held on Wednesdays to discuss your progress.  Your Social Worker will talk with you frequently to get your input and to update you on team discussions.  Team conferences with you and your family in attendance may also be held.  Expected length of stay: 12-14 days   Overall anticipated outcome: modified independent  Depending on your progress and recovery, your program may change. Your Social Worker will coordinate services and will keep you informed of any changes. Your Social Worker's name and contact numbers are listed  below.  The following services may also be recommended but are not provided by the [facility] will be made to provide these services after discharge if needed.  Arrangements include referral to agencies that provide these services.  Your insurance has been verified to be:  ?Workers Chartered certified accountant doctor is:  None  Pertinent information will be shared with your doctor and  your insurance company.  Social Worker:  [place], [place] or [phone]   Information discussed with and copy given to patient by: Lennart Pall, 04/23/2019, 5:14 PM

## 2019-04-23 NOTE — Progress Notes (Signed)
Brief Pharmacy Note:  Clarification:  Patient received 19 days of treatment dose enoxaparin (04/03/19-04/21/19) prior to transitioning to Xarelto. Therefore, will stop the Xarelto 15mg  PO BID with meals after today's doses (2 full days Xarelto at this induction dose), then transition to 20mg  PO daily with supper starting tomorrow, 04/24/19. Discussed with Marlowe Shores, PA-C. Will f/u CBC in AM as ordered by Dr. Posey Pronto.    Lindell Spar, PharmD, BCPS Pager: [phone] 04/23/2019 4:05 PM

## 2019-04-24 ENCOUNTER — Inpatient Hospital Stay (HOSPITAL_COMMUNITY): Payer: Self-pay | Admitting: Occupational Therapy

## 2019-04-24 ENCOUNTER — Inpatient Hospital Stay (HOSPITAL_COMMUNITY): Payer: Self-pay | Admitting: Physical Therapy

## 2019-04-24 LAB — CBC WITH DIFFERENTIAL/PLATELET
Abs Immature Granulocytes: 0.05 10*3/uL (ref 0.00–0.07)
Basophils Absolute: 0.1 10*3/uL (ref 0.0–0.1)
Basophils Relative: 1 %
Eosinophils Absolute: 0.3 10*3/uL (ref 0.0–0.5)
Eosinophils Relative: 3 %
HCT: 33 % — ABNORMAL LOW (ref 39.0–52.0)
Hemoglobin: 10 g/dL — ABNORMAL LOW (ref 13.0–17.0)
Immature Granulocytes: 1 %
Lymphocytes Relative: 50 %
Lymphs Abs: 4.2 10*3/uL — ABNORMAL HIGH (ref 0.7–4.0)
MCH: 28.4 pg (ref 26.0–34.0)
MCHC: 30.3 g/dL (ref 30.0–36.0)
MCV: 93.8 fL (ref 80.0–100.0)
Monocytes Absolute: 0.5 10*3/uL (ref 0.1–1.0)
Monocytes Relative: 6 %
Neutro Abs: 3.2 10*3/uL (ref 1.7–7.7)
Neutrophils Relative %: 39 %
Platelets: 355 10*3/uL (ref 150–400)
RBC: 3.52 MIL/uL — ABNORMAL LOW (ref 4.22–5.81)
RDW: 13.9 % (ref 11.5–15.5)
WBC: 8.3 10*3/uL (ref 4.0–10.5)
nRBC: 0 % (ref 0.0–0.2)

## 2019-04-24 LAB — GLUCOSE, CAPILLARY
Glucose-Capillary: 105 mg/dL — ABNORMAL HIGH (ref 70–99)
Glucose-Capillary: 90 mg/dL (ref 70–99)
Glucose-Capillary: 167 mg/dL — ABNORMAL HIGH (ref 70–99)
Glucose-Capillary: 228 mg/dL — ABNORMAL HIGH (ref 70–99)

## 2019-04-24 MED ORDER — IPRATROPIUM BROMIDE HFA 17 MCG/ACT IN AERS
2.0000 | INHALATION_SPRAY | Freq: Four times a day (QID) | RESPIRATORY_TRACT | Status: DC | PRN
  Filled 2019-04-24: qty 12.9

## 2019-04-24 NOTE — Progress Notes (Signed)
[place] [facility] PHYSICAL MEDICINE & REHABILITATION PROGRESS NOTE  Subjective/Complaints: Appreciate pharm note  ROS: Limited due to language, but appears to deny chest pain and shortness of breath  Objective: Vital Signs: Blood pressure 115/82, pulse 85, temperature 98.6 F (37 C), temperature source Oral, resp. rate (!) 22, height 5\' 5"  (1.651 m), weight 64.5 kg, SpO2 98 %. No results found. No results for input(s): WBC, HGB, HCT, PLT in the last 72 hours. No results for input(s): NA, K, CL, CO2, GLUCOSE, BUN, CREATININE, CALCIUM in the last 72 hours.  Physical Exam: BP 115/82 (BP Location: Left Arm)   Pulse 85   Temp 98.6 F (37 C) (Oral)   Resp (!) 22   Ht 5\' 5"  (1.651 m)   Wt 64.5 kg   SpO2 98%   BMI 23.66 kg/m  Constitutional: No distress . Vital signs reviewed. HENT: Normocephalic.  Atraumatic. Neck: + Stoma. Eyes: EOMI.  No discharge. Cardiovascular: No JVD. Respiratory: Normal effort.  + [place]. GI: Non-distended. Musc: No edema or tenderness in extremities. Neurological: Alert Makes good eye contact with examiner. Follows commands with gestural cues Motor:  Grossly 4-4+/5 throughout, unchanged Skin: A few scattered abrasions around face Psychiatric: He has a normal mood and affect. Hisbehavior is normal.  Assessment/Plan: 1. Functional deficits secondary to debility status post COVID-19 which require 3+ hours per day of interdisciplinary therapy in a comprehensive inpatient rehab setting.  Physiatrist is providing close team supervision and 24 hour management of active medical problems listed below.  Physiatrist and rehab team continue to assess barriers to discharge/monitor patient progress toward functional and medical goals  Care Tool:  Bathing    Body parts bathed by patient: Right arm, Chest, Left arm, Abdomen, Front perineal area, Right upper leg, Left upper leg, Face   Body parts bathed by helper: Left lower leg, Right lower leg, Buttocks     Bathing  assist Assist Level: Minimal Assistance - Patient > 75%     Upper Body Dressing/Undressing Upper body dressing   What is the patient wearing?: Pull over shirt    Upper body assist Assist Level: Contact Guard/Touching assist    Lower Body Dressing/Undressing Lower body dressing      What is the patient wearing?: Pants, Underwear/pull up     Lower body assist Assist for lower body dressing: Minimal Assistance - Patient > 75%     Toileting Toileting    Toileting assist Assist for toileting: Contact Guard/Touching assist     Transfers Chair/bed transfer  Transfers assist     Chair/bed transfer assist level: Supervision/Verbal cueing     Locomotion Ambulation   Ambulation assist      Assist level: Supervision/Verbal cueing Assistive device: No Device Max distance: 75'   Walk 10 feet activity   Assist     Assist level: Supervision/Verbal cueing Assistive device: No Device   Walk 50 feet activity   Assist    Assist level: Supervision/Verbal cueing Assistive device: No Device    Walk 150 feet activity   Assist Walk 150 feet activity did not occur: Safety/medical concerns         Walk 10 feet on uneven surface  activity   Assist Walk 10 feet on uneven surfaces activity did not occur: Safety/medical concerns         Wheelchair     Assist Will patient use wheelchair at discharge?: No   Wheelchair activity did not occur: N/A  Wheelchair assist level: Minimal Assistance - Patient > 75% Max  wheelchair distance: 150    Wheelchair 50 feet with 2 turns activity    Assist    Wheelchair 50 feet with 2 turns activity did not occur: N/A   Assist Level: Minimal Assistance - Patient > 75%   Wheelchair 150 feet activity     Assist Wheelchair 150 feet activity did not occur: N/A   Assist Level: Minimal Assistance - Patient > 75%      Medical Problem List and Plan: 1.Debilitysecondary to ARDS due to SARS COVID-19  infection. Repeat COVID test x2-  Continue CIR 2. Antithrombotics: -DVT/anticoagulation:Subcutaneous Lovenox 60 mg every 12 hours for suspected pulmonary emboli. CT angiogram of chest not completed due to AKI. Lower extremity Dopplers negative  Transition to Xarelto 20mg  per day-antiplatelet therapy: N/A 3. Pain Management:Oxycodone as needed 4. Mood:Klonopin 0.25 mg daily -antipsychotic agents: N/A 5. Neuropsych: This patientiscapable of making decisions on hisown behalf. 6. Skin/Wound Care:Routine skin care 7. Fluids/Electrolytes/Nutrition:Routine in and outs 8.AKI. Status post permacath 03/17/2019. Patient did initially receive intermittent dialysis.   Cr. 1.05 on 6/16, labs ordered for Monday  Cont to monitor 9.Acute blood loss anemia. Patient has been transfused during hospital course. Continue iron supplement  Hb 9.3 on 6/16, labs ordered for tomorrow  Cont to monitor 10. Respiratory failure   Tracheostomy 03/16/2019.  Decannulated on 6/16  Wean supplemental oxygen as tolerated 11.Intermittent sinus tachycardia.   Continue Lopressor 25 mg twice daily  Secondary to endurance, improved 6/20   Vitals:   04/23/19 1948 04/24/19 0405  BP:  115/82  Pulse:  85  Resp:  (!) 22  Temp:  98.6 F (37 C)  SpO2: 97% 98%   12.Diabetes mellitus.Hemoglobin A1c 5.5.   Lantus insulin 12 units twice daily.   NovoLog 2 units 3 times daily started on 6/18  Labile, but stabilizing on 6/18 CBG (last 3)  Recent Labs    04/23/19 1649 04/23/19 2107 04/24/19 0619  GLUCAP 160* 96 105*    LOS: 5 days A FACE TO FACE EVALUATION WAS PERFORMED  Luanna Salk Meoshia Billing 04/24/2019, 6:50 AM

## 2019-04-24 NOTE — Progress Notes (Addendum)
Occupational Therapy Session Note  Patient Details  Name: Jonathan [name] MRN: [id number] Date of Birth: 11-09-1970  Today's Date: 04/24/2019   1srt sessoin: OT Individual Time: 7001-7494 OT Individual Time Calculation (min): 75 min     Skilled Therapeutic Interventions/Progress Updates: patient focus this session standing and activity tolerance as he completed toileting (close S for toileting and close S walking HHA bed to/fr toilet) and extra time for bathing and dressing sit to stand.    Patient coughing constantly during session and agreed to don a mask.    Patient rquired many rest breaks for worked steadily in between breaks.   Interpret present  Call bell and phone placed within patient reach at the en dof the session  2nd session 1245-1345 60 minutes Patient participated in standing actiities, dynamically to increase endurance and success with independent self care.   Patient had difficulty following complex motor movements, even when interpreter demonstrated and spoke with him at length on where/how to move arms and legs.   He was left seated in relincer with alarm and call bell in place at the end of the session     Therapy Documentation Precautions:  Precautions Precautions: Other (comment) Precaution Comments: trach recently decannulated, watch O2 and HR Restrictions Weight Bearing Restrictions: No  Pain:denied   Therapy/Group: Individual Therapy  Alfredia Ferguson [facility] 04/24/2019, 2:23 PM

## 2019-04-24 NOTE — Progress Notes (Signed)
Physical Therapy Session Note  Patient Details  Name: Jonathan [name] MRN: [id number] Date of Birth: Dec 22, 1970  Today's Date: 04/24/2019 PT Individual Time: 1107-1204 PT Individual Time Calculation (min): 57 min   Short Term Goals: Week 1:  PT Short Term Goal 1 (Week 1): Pt will ambulate 59' w/ supervision w/ LRAD PT Short Term Goal 2 (Week 1): Pt will perform 30 min of upright activity w/ minimal increase in fatigue PT Short Term Goal 3 (Week 1): Pt will maintain dynamic standing balance w/ supervision  Skilled Therapeutic Interventions/Progress Updates:    Pt received sitting in w/c with in-person interpreter, Alleen Borne, present fand pt agreeable to therapy session. Received and maintained on 3L of O2 throughout therapy session with SpO2 monitored and decreased to as low as 89% on 3L with activity and increased to 94% or higher within 30 seconds with cuing for increased depth of breathing. Monitored HR with it increasing to a high of 124bpm during activity and decreased to 107bpm with rest. Performed ~322ft B UE w/c propulsion focusing on increased activity tolerance with supervision. Attempted seated diaphragmatic breathing with pt unable to coordinate muscle activation. Ambulated ~62ft w/c>EOM, no AD, with close supervision. Sit<>supine on mat with supervision. Performed supine diaphragmatic breathing retraining with max multimodal cuing and with increased time pt able to coordinate as long as he is focusing on the task. Ambulated, no AD,~82ft x2 progressed to ~168ft x2 all with seated breaks between and close supervision for safety with 1x CGA when turning due to unsteadiness - focus on increased activity tolerance. Performed standing B UE shoulder flexion holding 1lb dowel rod with max multimodal cuing for inspiration when raising and expiration when lowering rod with pt demonstrating impaired motor planning/coordination of movement with breath - mirror feedback provided. Ambulated ~136ft, no  AD, with close supervision back towards room then transported remained of distance in w/c. Pt left sitting in w/c with needs in reach, lines intact, and seat belt alarm on.  Therapy Documentation Precautions:  Precautions Precautions: Other (comment) Precaution Comments: trach recently decannulated, watch O2 and HR Restrictions Weight Bearing Restrictions: No  Pain: Denies pain during therapy session.   Therapy/Group: Individual Therapy  Tawana Scale, PT, DPT 04/24/2019, 8:02 AM

## 2019-04-25 LAB — GLUCOSE, CAPILLARY
Glucose-Capillary: 114 mg/dL — ABNORMAL HIGH (ref 70–99)
Glucose-Capillary: 164 mg/dL — ABNORMAL HIGH (ref 70–99)
Glucose-Capillary: 106 mg/dL — ABNORMAL HIGH (ref 70–99)
Glucose-Capillary: 222 mg/dL — ABNORMAL HIGH (ref 70–99)

## 2019-04-25 MED ORDER — INSULIN ASPART 100 UNIT/ML ~~LOC~~ SOLN
7.0000 [IU] | Freq: Three times a day (TID) | SUBCUTANEOUS | Status: DC
  Administered 2019-04-25 – 2019-04-30 (×15): 7 [IU] via SUBCUTANEOUS

## 2019-04-25 NOTE — Progress Notes (Signed)
[place] PHYSICAL MEDICINE & REHABILITATION PROGRESS NOTE  Subjective/Complaints: No issues overnite  ROS: Limited due to language, but appears to deny chest pain and shortness of breath  Objective: Vital Signs: Blood pressure 103/83, pulse 91, temperature 99 F (37.2 C), temperature source Oral, resp. rate 20, height 5\' 5"  (1.651 m), weight 65.5 kg, SpO2 99 %. No results found. Recent Labs    04/24/19 1025  WBC 8.3  HGB 10.0*  HCT 33.0*  PLT 355   No results for input(s): NA, K, CL, CO2, GLUCOSE, BUN, CREATININE, CALCIUM in the last 72 hours.  Physical Exam: BP 103/83 (BP Location: Right Arm)   Pulse 91   Temp 99 F (37.2 C) (Oral)   Resp 20   Ht 5\' 5"  (1.651 m)   Wt 65.5 kg   SpO2 99%   BMI 24.03 kg/m  Constitutional: No distress . Vital signs reviewed. HENT: Normocephalic.  Atraumatic. Neck:Former trach site bandaged no audible airleak  Eyes: EOMI.  No discharge. Cardiovascular: No JVD. Respiratory: Normal effort.  + [place]. GI: Non-distended. Musc: No edema or tenderness in extremities. Neurological: Alert Makes good eye contact with examiner. Follows commands with gestural cues Motor:  Grossly 4-4+/5 throughout, unchanged Skin: A few scattered abrasions around face Psychiatric: He has a normal mood and affect. Hisbehavior is normal.  Assessment/Plan: 1. Functional deficits secondary to debility status post COVID-19 which require 3+ hours per day of interdisciplinary therapy in a comprehensive inpatient rehab setting.  Physiatrist is providing close team supervision and 24 hour management of active medical problems listed below.  Physiatrist and rehab team continue to assess barriers to discharge/monitor patient progress toward functional and medical goals  Care Tool:  Bathing    Body parts bathed by patient: Right arm, Chest, Left arm, Abdomen, Front perineal area, Right upper leg, Left upper leg, Face   Body parts bathed by helper: Left lower leg,  Right lower leg, Buttocks     Bathing assist Assist Level: Minimal Assistance - Patient > 75%     Upper Body Dressing/Undressing Upper body dressing   What is the patient wearing?: Pull over shirt    Upper body assist Assist Level: Contact Guard/Touching assist    Lower Body Dressing/Undressing Lower body dressing      What is the patient wearing?: Pants, Underwear/pull up     Lower body assist Assist for lower body dressing: Minimal Assistance - Patient > 75%     Toileting Toileting    Toileting assist Assist for toileting: Contact Guard/Touching assist     Transfers Chair/bed transfer  Transfers assist     Chair/bed transfer assist level: Supervision/Verbal cueing     Locomotion Ambulation   Ambulation assist      Assist level: Supervision/Verbal cueing Assistive device: No Device Max distance: 157ft   Walk 10 feet activity   Assist     Assist level: Supervision/Verbal cueing Assistive device: No Device   Walk 50 feet activity   Assist    Assist level: Supervision/Verbal cueing Assistive device: No Device    Walk 150 feet activity   Assist Walk 150 feet activity did not occur: Safety/medical concerns  Assist level: Supervision/Verbal cueing Assistive device: No Device    Walk 10 feet on uneven surface  activity   Assist Walk 10 feet on uneven surfaces activity did not occur: Safety/medical concerns         Wheelchair     Assist Will patient use wheelchair at discharge?: No Type of Wheelchair: Manual  Wheelchair activity did not occur: N/A  Wheelchair assist level: Supervision/Verbal cueing, Set up assist Max wheelchair distance: 31ft    Wheelchair 50 feet with 2 turns activity    Assist    Wheelchair 50 feet with 2 turns activity did not occur: N/A   Assist Level: Supervision/Verbal cueing, Set up assist   Wheelchair 150 feet activity     Assist Wheelchair 150 feet activity did not occur:  N/A   Assist Level: Supervision/Verbal cueing, Set up assist      Medical Problem List and Plan: 1.Debilitysecondary to ARDS due to SARS COVID-19 infection. Repeat COVID test x2-  Continue CIR 2. Antithrombotics: -DVT/anticoagulation:Subcutaneous Lovenox 60 mg every 12 hours for suspected pulmonary emboli. CT angiogram of chest not completed due to AKI. Lower extremity Dopplers negative  Transition to Xarelto 20mg  per day-antiplatelet therapy: N/A 3. Pain Management:Oxycodone as needed 4. Mood:Klonopin 0.25 mg daily -antipsychotic agents: N/A 5. Neuropsych: This patientiscapable of making decisions on hisown behalf. 6. Skin/Wound Care:Routine skin care 7. Fluids/Electrolytes/Nutrition:Routine in and outs 8.AKI. Status post permacath 03/17/2019. Patient did initially receive intermittent dialysis.   Cr. 1.05 on 6/20  labs ordered for Monday  Cont to monitor 9.Acute blood loss anemia. Patient has been transfused during hospital course. Continue iron supplement  Hb 10 gm on 6/16, labs ordered for tomorrow  Cont to monitor 10. Respiratory failure   Tracheostomy 03/16/2019.  Decannulated on 6/16  Wean supplemental oxygen as tolerated 11.Intermittent sinus tachycardia.   Continue Lopressor 25 mg twice daily  Secondary to endurance, improved 6/21   Vitals:   04/24/19 1916 04/25/19 0501  BP: 117/79 103/83  Pulse: 95 91  Resp: 18 20  Temp: 97.9 F (36.6 C) 99 F (37.2 C)  SpO2: 97% 99%   12.Diabetes mellitus.Hemoglobin A1c 5.5.   Lantus insulin 12 units twice daily.   NovoLog6 U with meal will increase to 7U   Labile, but stabilizing on 6/21, am CBG ok no change in lantus CBG (last 3)  Recent Labs    04/24/19 1626 04/24/19 2101 04/25/19 0626  GLUCAP 167* 228* 114*    LOS: 6 days A FACE TO FACE EVALUATION WAS PERFORMED  Jonathan [name] 04/25/2019, 7:22 AM

## 2019-04-26 ENCOUNTER — Inpatient Hospital Stay (HOSPITAL_COMMUNITY): Payer: Self-pay | Admitting: Occupational Therapy

## 2019-04-26 ENCOUNTER — Inpatient Hospital Stay (HOSPITAL_COMMUNITY): Payer: Self-pay | Admitting: Physical Therapy

## 2019-04-26 LAB — BASIC METABOLIC PANEL
Anion gap: 11 (ref 5–15)
BUN: 20 mg/dL (ref 6–20)
CO2: 26 mmol/L (ref 22–32)
Calcium: 9.6 mg/dL (ref 8.9–10.3)
Chloride: 98 mmol/L (ref 98–111)
Creatinine, Ser: 0.99 mg/dL (ref 0.61–1.24)
GFR calc Af Amer: >60 mL/min (ref >60)
GFR calc non Af Amer: >60 mL/min (ref >60)
Glucose, Bld: 257 mg/dL — ABNORMAL HIGH (ref 70–99)
Potassium: 4.1 mmol/L (ref 3.5–5.1)
Sodium: 135 mmol/L (ref 135–145)

## 2019-04-26 LAB — CBC
HCT: 33.1 % — ABNORMAL LOW (ref 39.0–52.0)
Hemoglobin: 10.1 g/dL — ABNORMAL LOW (ref 13.0–17.0)
MCH: 28.9 pg (ref 26.0–34.0)
MCHC: 30.5 g/dL (ref 30.0–36.0)
MCV: 94.6 fL (ref 80.0–100.0)
Platelets: 312 10*3/uL (ref 150–400)
RBC: 3.5 MIL/uL — ABNORMAL LOW (ref 4.22–5.81)
RDW: 13.7 % (ref 11.5–15.5)
WBC: 8 10*3/uL (ref 4.0–10.5)
nRBC: 0 % (ref 0.0–0.2)

## 2019-04-26 LAB — GLUCOSE, CAPILLARY
Glucose-Capillary: 102 mg/dL — ABNORMAL HIGH (ref 70–99)
Glucose-Capillary: 115 mg/dL — ABNORMAL HIGH (ref 70–99)
Glucose-Capillary: 109 mg/dL — ABNORMAL HIGH (ref 70–99)
Glucose-Capillary: 137 mg/dL — ABNORMAL HIGH (ref 70–99)

## 2019-04-26 NOTE — Progress Notes (Signed)
Physical Therapy Weekly Progress Note  Patient Details  Name: Jonathan [name] MRN: [id number] Date of Birth: January 31, 1971  Beginning of progress report period: April 20, 2019 End of progress report period: April 27, 2019  Today's Date: 04/27/2019 PT Individual Time: 1245-1345 PT Individual Time Calculation (min): 60 min   Patient has met 3 of 3 short term goals. Pt is making excellent progress towards LTGs, performing all mobility w/ supervision including gait up to 100-150' w/o AD, bed mobility, and transfers. However he continues to require up to 4 L/min of supplemental O2 w/ upright mobility, 1-2 L/min @ rest.   Patient continues to demonstrate the following deficits muscle weakness, decreased cardiorespiratoy endurance and decreased oxygen support and decreased standing balance and decreased balance strategies and therefore will continue to benefit from skilled PT intervention to increase functional independence with mobility.  Patient progressing toward long term goals..  Continue plan of care.  PT Short Term Goals Week 1:  PT Short Term Goal 1 (Week 1): Pt will ambulate 99' w/ supervision w/ LRAD PT Short Term Goal 1 - Progress (Week 1): Met PT Short Term Goal 2 (Week 1): Pt will perform 30 min of upright activity w/o increase in fatigue PT Short Term Goal 2 - Progress (Week 1): Met PT Short Term Goal 3 (Week 1): Pt will maintain dynamic standing balance w/ supervision PT Short Term Goal 3 - Progress (Week 1): Met Week 2:  PT Short Term Goal 1 (Week 2): =LTGs due to ELOS  Skilled Therapeutic Interventions/Progress Updates:   Pt missed 15 min of skilled PT at beginning of session 2/2 eating lunch. Pt in recliner and agreeable to therapy, no c/o pain. Ambulated around unit in multiple 100' bouts w/ supervision, w/o AD. 4 L/min of supplemental O2 and spO2 down to 79% upon sitting for rest breaks, but returns to >88% within 20-30 sec and verbal cues for breathing strategies. Practiced  negotiating stairs w/ unilateral rail on L, as per home set-up, performed w/ supervision. Contacted pt's family (daughter and spouse) via [employer] to discuss his progress. Demonstrated pt's CLOF for them on video w/ supervision for gait and discussed his current endurance and strength deficits. Educated family members on need for supplemental oxygen at this time and need to pace himself for functional mobility for the next few months. Answered questions and allowed pt to have conversation while this therapist monitored spO2. Able to maintain conversation and upright sitting w/o back support at 1 L/min, >88%. Unable to maintain oxygenation at 0.5 L/min or on room air. Ongoing education of oxygen requirements of talking and walking at the same time as well. Returned to room and ended session in recliner, all needs in reach. Provided pt w/ written handout of information on supplemental oxygen in Spanish.   Therapy Documentation Precautions:  Precautions Precautions: Other (comment) Precaution Comments: trach recently decannulated, watch O2 and HR Restrictions Weight Bearing Restrictions: No Vital Signs: Therapy Vitals Temp: 98 F (36.7 C) Pulse Rate: 97 Resp: 18 BP: 120/90 Patient Position (if appropriate): Sitting Oxygen Therapy SpO2: 98 % O2 Device: Room Air  Therapy/Group: Individual Therapy  Ariannah Arenson Clent Demark 04/27/2019, 5:58 PM

## 2019-04-26 NOTE — Progress Notes (Signed)
[place] PHYSICAL MEDICINE & REHABILITATION PROGRESS NOTE  Subjective/Complaints: Patient seen sitting up in bed this morning.  He indicates he slept well overnight.  He appears to have had a good weekend.  ROS: Limited due to language, but appears to deny chest pain and shortness of breath  Objective: Vital Signs: Blood pressure 112/84, pulse 90, temperature 98.6 F (37 C), temperature source Oral, resp. rate 18, height 5\' 5"  (1.651 m), weight 64.4 kg, SpO2 96 %. No results found. Recent Labs    04/24/19 1025  WBC 8.3  HGB 10.0*  HCT 33.0*  PLT 355   No results for input(s): NA, K, CL, CO2, GLUCOSE, BUN, CREATININE, CALCIUM in the last 72 hours.  Physical Exam: BP 112/84   Pulse 90   Temp 98.6 F (37 C) (Oral)   Resp 18   Ht 5\' 5"  (1.651 m)   Wt 64.4 kg   SpO2 96%   BMI 23.63 kg/m  Constitutional: No distress . Vital signs reviewed. HENT: Normocephalic.  Atraumatic. Neck: + [place] Eyes: EOMI.  No discharge. Cardiovascular: No JVD. Respiratory: Normal effort.  + [place]. GI: Non-distended. Musc: No edema or tenderness in extremities. Neurological: Alert Makes good eye contact with examiner. Follows commands with gestural cues Motor:  Grossly 4-4+/5 throughout, stable Skin: Warm and dry.  Intact. Psychiatric: He has a normal mood and affect. Hisbehavior is normal.  Assessment/Plan: 1. Functional deficits secondary to debility status post COVID-19 which require 3+ hours per day of interdisciplinary therapy in a comprehensive inpatient rehab setting.  Physiatrist is providing close team supervision and 24 hour management of active medical problems listed below.  Physiatrist and rehab team continue to assess barriers to discharge/monitor patient progress toward functional and medical goals  Care Tool:  Bathing    Body parts bathed by patient: Right arm, Chest, Left arm, Abdomen, Front perineal area, Right upper leg, Left upper leg, Face   Body parts bathed by  helper: Left lower leg, Right lower leg, Buttocks     Bathing assist Assist Level: Minimal Assistance - Patient > 75%     Upper Body Dressing/Undressing Upper body dressing   What is the patient wearing?: Pull over shirt    Upper body assist Assist Level: Contact Guard/Touching assist    Lower Body Dressing/Undressing Lower body dressing      What is the patient wearing?: Pants, Underwear/pull up     Lower body assist Assist for lower body dressing: Minimal Assistance - Patient > 75%     Toileting Toileting    Toileting assist Assist for toileting: Contact Guard/Touching assist     Transfers Chair/bed transfer  Transfers assist     Chair/bed transfer assist level: Supervision/Verbal cueing     Locomotion Ambulation   Ambulation assist      Assist level: Supervision/Verbal cueing Assistive device: No Device Max distance: 146ft   Walk 10 feet activity   Assist     Assist level: Supervision/Verbal cueing Assistive device: No Device   Walk 50 feet activity   Assist    Assist level: Supervision/Verbal cueing Assistive device: No Device    Walk 150 feet activity   Assist Walk 150 feet activity did not occur: Safety/medical concerns  Assist level: Supervision/Verbal cueing Assistive device: No Device    Walk 10 feet on uneven surface  activity   Assist Walk 10 feet on uneven surfaces activity did not occur: Safety/medical concerns         Wheelchair     Assist  Will patient use wheelchair at discharge?: No Type of Wheelchair: Manual Wheelchair activity did not occur: N/A  Wheelchair assist level: Supervision/Verbal cueing, Set up assist Max wheelchair distance: 38ft    Wheelchair 50 feet with 2 turns activity    Assist    Wheelchair 50 feet with 2 turns activity did not occur: N/A   Assist Level: Supervision/Verbal cueing, Set up assist   Wheelchair 150 feet activity     Assist Wheelchair 150 feet activity  did not occur: N/A   Assist Level: Supervision/Verbal cueing, Set up assist      Medical Problem List and Plan: 1.Debilitysecondary to ARDS due to SARS COVID-19 infection. Repeat COVID test x2-  Continue CIR  Weekend notes reviewed- stable 2. Antithrombotics: -DVT/anticoagulation:CT angiogram of chest not completed due to AKI. Lower extremity Dopplers negative  Continue Xarelto  antiplatelet therapy: See above 3. Pain Management:Oxycodone as needed 4. Mood:Klonopin 0.25 mg daily -antipsychotic agents: N/A 5. Neuropsych: This patientiscapable of making decisions on hisown behalf. 6. Skin/Wound Care:Routine skin care 7. Fluids/Electrolytes/Nutrition:Routine in and outs 8.AKI. Status post permacath 03/17/2019. Patient did initially receive intermittent dialysis.   Cr. 1.05 on 6/20  labs pending  Cont to monitor 9.Acute blood loss anemia. Patient has been transfused during hospital course. Continue iron supplement  Hb 10 gm on 6/16, labs pending  Cont to monitor 10. Respiratory failure   Tracheostomy 03/16/2019.  Decannulated on 6/16  Wean supplemental oxygen as tolerated 11.Intermittent sinus tachycardia.   Continue Lopressor 25 mg twice daily  Secondary to endurance, overall improving   Vitals:   04/26/19 0503 04/26/19 0800  BP: 101/89 112/84  Pulse: 91 90  Resp: 18   Temp: 98.6 F (37 C)   SpO2: 96%    12.Diabetes mellitus.Hemoglobin A1c 5.5.   Lantus insulin 12 units twice daily.   NovoLog6 U with meal will increase to 7U   Labile on 6/22 CBG (last 3)  Recent Labs    04/25/19 1644 04/25/19 2105 04/26/19 0638  GLUCAP 106* 222* 102*    LOS: 7 days A FACE TO FACE EVALUATION WAS PERFORMED  Kail Fraley Lorie Phenix 04/26/2019, 8:40 AM

## 2019-04-26 NOTE — Progress Notes (Signed)
[place] for Lovenox>>Rivaroxaban Indication: suspected PE  Patient Measurements: Height: 5\' 5"  (165.1 cm) Weight: 141 lb 15.6 oz (64.4 kg) IBW/kg (Calculated) : 61.5  Vital Signs: Temp: 98.6 F (37 C) (06/22 0503) Temp Source: Oral (06/22 0503) BP: 112/84 (06/22 0800) Pulse Rate: 90 (06/22 0800)  Labs: Recent Labs    04/24/19 1025 04/26/19 0844  HGB 10.0* 10.1*  HCT 33.0* 33.1*  PLT 355 312  CREATININE  --  0.99   Estimated Creatinine Clearance: 79.4 mL/min (by C-G formula based on SCr of 0.99 mg/dL).  Medical History: History reviewed. No pertinent past medical history.  Assessment: 60 YOM admitted on 02/23/2019 with ARDS due to COVID-19 who was on IV heparin for an extended period of time and transitioned to apixaban on 5/23 for r/o PE. Patient then experienced a drop in Hgb on 5/24 requiring 2u PRBCs. Apixaban was stopped but with Hgb remaining stable with no s/sx of overt bleeding, IV heparin resumed 5/25. Patient fell in room the evening of 5/26, and heparin stopped at 2300. CT head negative; thus heparin resumed 5/27 ~ 0200. On 5/30, IV heparin transitioned to SQ Lovenox. Pharmacy consulted for dosing. Note, patient developed AKI requiring CRRT, then IHD, now improved and stable.   MD wishes to transition to Po xarelto dosing. Patient has had 19 days of SQ enoxaparin, therefore will give 2 more days of xarelto 15mg  BID and then transition to 20mg  daily on 6/20  Hgb 8.8 >> 10.1, PLT wnl  Goal of Therapy:  Monitor platelets by anticoagulation protocol: Yes    Plan: Continue Xarelto 20mg  daily Monitor CBC   Minda Ditto PharmD Clinical Pharmacist [phone] Please utilize [employer] for appropriate phone number to reach the unit pharmacist ([facility])

## 2019-04-26 NOTE — Progress Notes (Signed)
Physical Therapy Session Note  Patient Details  Name: Jonathan [name] MRN: [id number] Date of Birth: 05-30-1971  Today's Date: 04/26/2019 PT Individual Time: 5701-7793 AND 1100-1155 PT Individual Time Calculation (min): 55 min AND 55 min  Short Term Goals: Week 1:  PT Short Term Goal 1 (Week 1): Pt will ambulate 16' w/ supervision w/ LRAD PT Short Term Goal 2 (Week 1): Pt will perform 30 min of upright activity w/ minimal increase in fatigue PT Short Term Goal 3 (Week 1): Pt will maintain dynamic standing balance w/ supervision  Skilled Therapeutic Interventions/Progress Updates:   Session 1:  Pt in supine and agreeable to therapy, no c/o pain. Supine>sit w/ supervision. Pt w/ shoes that were donated to him, requesting to don. Set-up assist to don shoes at EOB, pt states he feels more secure during gait w/ shoes on. Session focused on endurance and decreased supplemental O2 needs. Ambulated in 10+ bouts of 100-150' each w/ close supervision. Started out on 4 L/min O2. Pt stays saturated >92% during gait, but then desats to 70s after a few seconds of rest. Returns to >90% within 30-45 sec, verbal and visual cues for breathing strategies. 1 gait bout performed on 3 L/min O2, remained saturated during walk and desat once in sitting to 71%, required increased time to return to >90%. Will continue to recommend to nursing staff that he have 4 L/min supplemental O2 w/ all OOB mobility. Able to safely maintain saturation >93% on 1 L/min at rest at end of session. Returned to room and ended session in w/c, all needs in reach.   Session 2:  Pt in w/c and agreeable to therapy, no c/o pain. Interpreter present throughout session. Ambulated to day room, >200' w/ supervision. Pt requesting to attempt to go all the way w/o rest break. Pt ambulated w/ 4 L/min supplemental O2, desat to 67% however returned to >90% within 30 sec of resting. Discussed importance of pacing and taking breaks, even if he is  feeling ok. Discussed this both in hospital and home settings. Pt verbalized understanding and in agreement. NuStep 4 min x3 @ level 3 for global strengthening and endurance training. O2 >88% on 4 L/min. Discussed pt's responsibilities at home, pt states his roommates already take turns cooking for each other so they can cook for him for as long as he needs. He also stated that his roommates can go grocery shopping for him as well. Pt w/ good awareness of his deficits and understands that these tasks would be very challenging right now from an endurance standpoint. Performed Berg Balance Scale to assess for improvement from last week, scored 46/56. Practiced performing floor transfer w/ supervision x2 reps. This was very tiring for pt, discussed importance of ambulating over level ground only at this time to minimize fall risk. Returned to room and ended session in w/c, all needs in reach.   Therapy Documentation Precautions:  Precautions Precautions: Other (comment) Precaution Comments: trach recently decannulated, watch O2 and HR Restrictions Weight Bearing Restrictions: No Vital Signs: Therapy Vitals Pulse Rate: 90 BP: 112/84  Therapy/Group: Individual Therapy  Tiana Sivertson K Tenee Wish 04/26/2019, 11:08 AM

## 2019-04-26 NOTE — Progress Notes (Signed)
Occupational Therapy Session Note  Patient Details  Name: Jonathan [name] MRN: [id number] Date of Birth: 1970/11/15  Today's Date: 04/26/2019 OT Individual Time: 1300-1413 OT Individual Time Calculation (min): 73 min   Short Term Goals: Week 1:  OT Short Term Goal 1 (Week 1): Pt will tolerate standing at the sink for BADL tasks for 5 minutes without rest break  OT Short Term Goal 2 (Week 1): Pt will maintain SpO2 above 90% on 4L during BADL tasks. OT Short Term Goal 3 (Week 1): Pt will complete LB dressing with supervision  Skilled Therapeutic Interventions/Progress Updates:    Pt on 3L of O2 upon OT arrival SpO2 100 at rest HR 113. Pt collected clothing from bag, then ambulated into bathroom on 4L. SpO2 checked after doffing clothing -[id number] SpO2 80 on 4L. Pt took seated rest break and deep breathing, but unable to recover above 90- so pt placed on 6L for remainder of shower. S/p shower, SpO2 85 HR 136 on 6L. Rest and deep breathing to recover above 90. Pt continued to desat on 6L throughout bathing/dressing tasks-but able to recover quickly with rest and deep breathing. Had pt practice managing nasal canula during bathing/dressing as he will likely have to dc home on O2. Worked on standing balance/endurance with standing shaving task. Pt tolerated standing for 10 mins on 6L of and maintained SpO2 at 90. Pt bumped back down to 4L at rest. SpO2 98 so left on 4L to ambulate to the gym. SpO2 93, HR 127. Pt completed 10 mins on [employer] arm bike on level 1. He then practiced tub bench transfer using tub bench with education provided for energy conservation techniques. Pt ambulated back to room to recliner. O2 decreased to 3L for rest. Pt left seated in recliner with chair alarm on and needs met.   Therapy Documentation Precautions:  Precautions Precautions: Other (comment) Precaution Comments: trach recently decannulated, watch O2 and HR Restrictions Weight Bearing Restrictions: No Pain:  Denies pain   Therapy/Group: Individual Therapy  Valma Cava 04/26/2019, 1:14 PM

## 2019-04-27 ENCOUNTER — Inpatient Hospital Stay (HOSPITAL_COMMUNITY): Payer: Self-pay | Admitting: Physical Therapy

## 2019-04-27 ENCOUNTER — Inpatient Hospital Stay (HOSPITAL_COMMUNITY): Payer: Self-pay | Admitting: Occupational Therapy

## 2019-04-27 LAB — GLUCOSE, CAPILLARY
Glucose-Capillary: 92 mg/dL (ref 70–99)
Glucose-Capillary: 116 mg/dL — ABNORMAL HIGH (ref 70–99)
Glucose-Capillary: 288 mg/dL — ABNORMAL HIGH (ref 70–99)
Glucose-Capillary: 165 mg/dL — ABNORMAL HIGH (ref 70–99)

## 2019-04-27 NOTE — Plan of Care (Signed)
  Problem: RH SKIN INTEGRITY Goal: RH STG MAINTAIN SKIN INTEGRITY WITH ASSISTANCE Description: STG Maintain Skin Integrity With Assistance. Mod i Outcome: Progressing   Problem: RH SAFETY Goal: RH STG ADHERE TO SAFETY PRECAUTIONS W/ASSISTANCE/DEVICE Description: STG Adhere to Safety Precautions With Assistance/Device. Mod I Outcome: Progressing   Problem: RH KNOWLEDGE DEFICIT GENERAL Goal: RH STG INCREASE KNOWLEDGE OF SELF CARE AFTER HOSPITALIZATION Description: Patient will be able to describe self care following rehab including medications for HTN and T2DM with cues/handouts Outcome: Progressing   Problem: Consults Goal: RH GENERAL PATIENT EDUCATION Description: See Patient Education module for education specifics. Outcome: Progressing

## 2019-04-27 NOTE — Progress Notes (Signed)
Social Work Patient ID: Jonathan [name], male   DOB: 1970/12/04, 48 y.o.   MRN: [id number]    Have spoken with pt's employer, [name], who is also an approved contact person and is coordinating [place].  Alerted her that team feels pt will be ready for d/c at the end of the week (target is Friday) and is expected to be modified independent, however, will still need oxygen.  She has questions about pt's current medical status and prognosis for when he may be ready for travel back to his home in [place].  Have asked Dr. Posey Pronto to follow up with her as this may affect how she coordinates the living arrangements on their farm properties for his return.  Jaelynn Currier, LCSW

## 2019-04-27 NOTE — Progress Notes (Signed)
[place] PHYSICAL MEDICINE & REHABILITATION PROGRESS NOTE  Subjective/Complaints: Patient seen lying in bed this morning.  He states he slept well overnight.  ROS: Limited due to language, but appears to suggest mild shortness of breath, denies chest pain.  Objective: Vital Signs: Blood pressure 117/88, pulse 86, temperature (!) 97.3 F (36.3 C), temperature source Oral, resp. rate 16, height 5\' 5"  (1.651 m), weight 64.4 kg, SpO2 97 %. No results found. Recent Labs    04/24/19 1025 04/26/19 0844  WBC 8.3 8.0  HGB 10.0* 10.1*  HCT 33.0* 33.1*  PLT 355 312   Recent Labs    04/26/19 0844  NA 135  K 4.1  CL 98  CO2 26  GLUCOSE 257*  BUN 20  CREATININE 0.99  CALCIUM 9.6    Physical Exam: BP 117/88 (BP Location: Right Arm)   Pulse 86   Temp (!) 97.3 F (36.3 C) (Oral)   Resp 16   Ht 5\' 5"  (1.651 m)   Wt 64.4 kg   SpO2 97%   BMI 23.63 kg/m  Constitutional: No distress . Vital signs reviewed. HENT: Normocephalic.  Atraumatic. Neck: + Stoma. Eyes: EOMI.  No discharge. Cardiovascular: No JVD. Respiratory: Normal effort.  +[place]. GI: Non-distended. Musc: No edema or tenderness in extremities. Neurological: Alert Makes good eye contact with examiner. Follows commands with gestural cues Motor:  Grossly 4-4+/5 throughout, unchanged Skin: Warm and dry.  Intact. Psychiatric: He has a normal mood and affect. Hisbehavior is normal.  Assessment/Plan: 1. Functional deficits secondary to debility status post COVID-19 which require 3+ hours per day of interdisciplinary therapy in a comprehensive inpatient rehab setting.  Physiatrist is providing close team supervision and 24 hour management of active medical problems listed below.  Physiatrist and rehab team continue to assess barriers to discharge/monitor patient progress toward functional and medical goals  Care Tool:  Bathing    Body parts bathed by patient: Right arm, Chest, Left arm, Abdomen, Front perineal  area, Right upper leg, Left upper leg, Face   Body parts bathed by helper: Left lower leg, Right lower leg, Buttocks     Bathing assist Assist Level: Minimal Assistance - Patient > 75%     Upper Body Dressing/Undressing Upper body dressing   What is the patient wearing?: Pull over shirt    Upper body assist Assist Level: Contact Guard/Touching assist    Lower Body Dressing/Undressing Lower body dressing      What is the patient wearing?: Pants, Underwear/pull up     Lower body assist Assist for lower body dressing: Minimal Assistance - Patient > 75%     Toileting Toileting    Toileting assist Assist for toileting: Contact Guard/Touching assist     Transfers Chair/bed transfer  Transfers assist     Chair/bed transfer assist level: Supervision/Verbal cueing     Locomotion Ambulation   Ambulation assist      Assist level: Supervision/Verbal cueing Assistive device: No Device Max distance: 200'   Walk 10 feet activity   Assist     Assist level: Supervision/Verbal cueing Assistive device: No Device   Walk 50 feet activity   Assist    Assist level: Supervision/Verbal cueing Assistive device: No Device    Walk 150 feet activity   Assist Walk 150 feet activity did not occur: Safety/medical concerns  Assist level: Supervision/Verbal cueing Assistive device: No Device    Walk 10 feet on uneven surface  activity   Assist Walk 10 feet on uneven surfaces activity did  not occur: Safety/medical concerns         Wheelchair     Assist Will patient use wheelchair at discharge?: No Type of Wheelchair: Manual Wheelchair activity did not occur: N/A  Wheelchair assist level: Supervision/Verbal cueing, Set up assist Max wheelchair distance: 355ft    Wheelchair 50 feet with 2 turns activity    Assist    Wheelchair 50 feet with 2 turns activity did not occur: N/A   Assist Level: Supervision/Verbal cueing, Set up assist    Wheelchair 150 feet activity     Assist Wheelchair 150 feet activity did not occur: N/A   Assist Level: Supervision/Verbal cueing, Set up assist      Medical Problem List and Plan: 1.Debilitysecondary to ARDS due to SARS COVID-19 infection. Repeat COVID test x2-  Continue CIR 2. Antithrombotics: -DVT/anticoagulation:CT angiogram of chest not completed due to AKI. Lower extremity Dopplers negative  Continue Xarelto  antiplatelet therapy: See above 3. Pain Management:Oxycodone as needed 4. Mood:Klonopin 0.25 mg daily -antipsychotic agents: N/A 5. Neuropsych: This patientiscapable of making decisions on hisown behalf. 6. Skin/Wound Care:Routine skin care 7. Fluids/Electrolytes/Nutrition:Routine in and outs 8.AKI. Status post permacath 03/17/2019. Patient did initially receive intermittent dialysis.   Cr.  0.99 on 6/22, improving  Cont to monitor 9.Acute blood loss anemia. Patient has been transfused during hospital course. Continue iron supplement  Hb 10.1 on 6/22  Cont to monitor 10. Respiratory failure   Tracheostomy 03/16/2019.  Decannulated on 6/16  Wean supplemental oxygen as tolerated, continues to require 11.Intermittent sinus tachycardia.   Continue Lopressor 25 mg twice daily  Secondary to endurance, overall improving on 6/23   Vitals:   04/26/19 2051 04/27/19 0530  BP: 122/83 117/88  Pulse: 96 86  Resp: 18 16  Temp: 98.3 F (36.8 C) (!) 97.3 F (36.3 C)  SpO2: 99% 97%   12.Diabetes mellitus.Hemoglobin A1c 5.5.   Lantus insulin 12 units twice daily.   NovoLog6 U with meal will increase to 7U   Slightly labile, but stabilizing on 6/23 CBG (last 3)  Recent Labs    04/26/19 1709 04/26/19 2136 04/27/19 0629  GLUCAP 109* 137* 92    LOS: 8 days A FACE TO FACE EVALUATION WAS PERFORMED  Jonathan [name] Jonathan [name] 04/27/2019, 7:50 AM

## 2019-04-27 NOTE — Progress Notes (Signed)
Occupational Therapy Weekly Progress Note  Patient Details  Name: Jonathan [name] MRN: [id number] Date of Birth: [birth year]  Beginning of progress report period: April 20, 2019 End of progress report period: April 27, 2019  Today's Date: 04/27/2019 OT Individual Time: 1517-6160 OT Individual Time Calculation (min): 60 min    Patient has met 2 of 3 short term goals.  Pt is making steady progress towards OT goals. Pt has progressed to an overall supervision level for BADL tasks. His biggest limitations are with endurance and O2 needs. Pt continues to need up to 6L to maintain O2 saturations above 90%. SpO2 98% after shower, so weaned back to 4L for dressing tasks-SpO2 93% during dressing on 4L. Working on trying to wean O2 needs. Continues current POC.  Patient continues to demonstrate the following deficits: muscle weakness, decreased cardiorespiratoy endurance and decreased oxygen support and decreased standing balance and decreased balance strategies and therefore will continue to benefit from skilled OT intervention to enhance overall performance with BADL.  Patient progressing toward long term goals..  Continue plan of care.  OT Short Term Goals Week 1:  OT Short Term Goal 1 (Week 1): Pt will tolerate standing at the sink for BADL tasks for 5 minutes without rest break  OT Short Term Goal 1 - Progress (Week 1): Met OT Short Term Goal 2 (Week 1): Pt will maintain SpO2 above 90% on 4L during BADL tasks. OT Short Term Goal 2 - Progress (Week 1): Progressing toward goal OT Short Term Goal 3 (Week 1): Pt will complete LB dressing with supervision OT Short Term Goal 3 - Progress (Week 1): Met Week 2:  OT Short Term Goal 1 (Week 2): LTG=STG 2/2 ELOS  Skilled Therapeutic Interventions/Progress Updates:    Pt greeted seated in recliner and agreeable to OT treatment session. Pt on 3L of O2 with SpO2 at 100%. Pt placed on 4L for mobility. Pt ambulated in room to collect clothing w/  supervision. Pt sat on shower chair to doff clothing and pt satting at 83%. Tried rest and deep breathing, but slow to recover, so placed on 6L for remainder of shower with SpO2 maintained at 93-98%. Pt placed down to 4L for dressing tasks. SpO2 maintained above 93 for dressing. Grooming tasks in standing at the sink with supervision. Pt sat in recliner and OT provided extensive education on O2 needs and how pt will likely be going home on oxygen. Discussed self-monitoring of breathing and energy conservation techniques within BADL tasks. Pt ambulated to therapy gym with supervision. Standing endurance and balance activity standing on foam and playing connect 4. Pt tolerated 3, 5 minute intervals in standing. SpO2 100% on 4L during standing tasks. Pt ambulated back to room and left seated in wc with needs met. Pt placed back on 2L for rest.   Therapy Documentation Precautions:  Precautions Precautions: Other (comment) Precaution Comments: trach recently decannulated, watch O2 and HR Restrictions Weight Bearing Restrictions: No General: Pain:  denies pain  Therapy/Group: Individual Therapy  Valma Cava 04/27/2019, 9:14 AM

## 2019-04-27 NOTE — Progress Notes (Signed)
Physical Therapy Session Note  Patient Details  Name: Korvin Valentine MRN: [id number] Date of Birth: 04-23-1971  Today's Date: 04/27/2019 PT Individual Time: 0909-1009 PT Individual Time Calculation (min): 60 min   Short Term Goals: Week 1:  PT Short Term Goal 1 (Week 1): Pt will ambulate 13' w/ supervision w/ LRAD PT Short Term Goal 2 (Week 1): Pt will perform 30 min of upright activity w/ minimal increase in fatigue PT Short Term Goal 3 (Week 1): Pt will maintain dynamic standing balance w/ supervision  Skilled Therapeutic Interventions/Progress Updates:    Pt received supine in bed and agreeable to therapy session. Language interpreter, Mickel Baas, present for entire session. Pt received on 3L of O2 via nasal cannula and maintained on 3L increased to 4L during activity due to SpO2 desaturating as low as 74%. Supine>sit, HOB partially elevated, with supervision. Sit<>stand with supervision throughout session. Ambulated ~161ft, no AD, with supervision on 3L of O2 with SpO2 decreasing to 76% during seated rest break - cuing for increased depth of breathing for SpO2 to increase >90%. Ambulated ~68ft, no AD, with close supervision. Performed repeated sit<>stand EOM, no UE support, 1x20reps, 1x15 reps with pt initially on 3L of O2 then increased to 4 L of O2 due to SpO2 desaturating to 74% after 2nd round and HR increasing to 142bpm - after 1 minute pt's SpO2 increases >90% and pt's HR continues to fluctuate between 115-125bpm. On 3rd trial of repeated sit<>stand X15 reps therapist provided extensive education/max cuing for timing inhalation/exhalation with exertion along with external target to improve pt's pursed lip breathing during sit<>stand functional movement - pt's SpO2 maintained >90% with this increased focus on respiration. Pt educated on practicing pursed lip breathing in the room. Performed alternate B LE foot taps on 4" step focusing on standing balance with activity tolerance with  supervision/CGA for safety SpO2 decreasing to 85% on 4L O2, attempted to decrease to 3L for 2nd set with SpO2 desaturating to 78% and returning to >90% in 30 seconds on 4L. Repeated step-up on 4" step, no UE support, focusing on activity tolerance with CGA for steadying 1st trial SpO2 desat to 84% increasing to 90% in 30 second; 2nd trial same SpO2. Ambulated ~141ft back to room with supervision. Pt left sitting in recliner with needs in reach and chair alarm on.  Therapy Documentation Precautions:  Precautions Precautions: Other (comment) Precaution Comments: trach recently decannulated, watch O2 and HR Restrictions Weight Bearing Restrictions: No  Pain:   Denies pain during session.   Therapy/Group: Individual Therapy  Tawana Scale, PT, DPT 04/27/2019, 7:50 AM

## 2019-04-27 NOTE — Progress Notes (Signed)
Pharmacy discontinued Xarelto consult but will continue to follow peripherally. Please consult if anything else needed.  Thank you for involving pharmacy in this patient's care.  Renold Genta, PharmD, BCPS Clinical Pharmacist Clinical phone for 04/27/2019 until 3p is [phone] 04/27/2019 9:35 AM  **Pharmacist phone directory can be found on [email or web address] listed under [place]**

## 2019-04-28 ENCOUNTER — Inpatient Hospital Stay (HOSPITAL_COMMUNITY): Payer: Self-pay

## 2019-04-28 ENCOUNTER — Inpatient Hospital Stay (HOSPITAL_COMMUNITY): Payer: Self-pay | Admitting: Physical Therapy

## 2019-04-28 ENCOUNTER — Inpatient Hospital Stay: Payer: Self-pay

## 2019-04-28 LAB — GLUCOSE, CAPILLARY
Glucose-Capillary: 100 mg/dL — ABNORMAL HIGH (ref 70–99)
Glucose-Capillary: 99 mg/dL (ref 70–99)
Glucose-Capillary: 128 mg/dL — ABNORMAL HIGH (ref 70–99)
Glucose-Capillary: 133 mg/dL — ABNORMAL HIGH (ref 70–99)

## 2019-04-28 NOTE — Progress Notes (Signed)
Physical Therapy Session Note  Patient Details  Name: Jonathan [name] MRN: [id number] Date of Birth: 02/02/1971  Today's Date: 04/28/2019 PT Individual Time: 1303-1415 PT Individual Time Calculation (min): 72 min   Short Term Goals: Week 2:  PT Short Term Goal 1 (Week 2): =LTGs due to ELOS  Skilled Therapeutic Interventions/Progress Updates: Pt presented in recliner agreeable to therapy. Interpreter Laura present throughout session.  PT denies pain throughout session. Prior to ambulation SpO2 96% 2L supplemental O2. Pt ambulated to rehab gym supervision level no AD and participated in dynamic balance and endurance activities. Pt participated in re-bounder with 1Kg ball 2 bouts 15 pt without c/o SOB after second bout however when SpO2 checked 79% HR 130. PT was able to recover within 1 min without increase in O2. PTA discussed with pt although asymptomatic may require more frequent rest breaks which pt verbalized understanding. Pt participated in 2 more bouts of rebounder with weighted ball with single LE on 4 in step taking seated breaks between each bout with SpO2 maintaining 88-90% after activity. Participated in alternating toe taps x 15 ea no AD with pt 89% after activity. Pt ambulated to day room and participated in 2 bouts of cornhole while standing on [employer]. After throwing bags pt would desat to mid 80s however was able to recover to low 90s while remaining in standing. Pt then went to pick up bags from floor and was able to perform activity with supervision however desat to mid 70s once returned to mat and required temporary increase to 3L for recovery (>1min). Participated in NuStep L4 x 5 min for global strengthening and endurance with PTA noting that pt with shortened breaths at 3:30 SpO2 checked 77% increased to 3L and pt able to complete 5 min >88% on 3L. Pt ambulated back to room with seated rest at elevators with desat to low 80s at elevator and once back in room with pt able to  recover without additional increase in supplemental O2. Pt remained in recliner at end of session with seat alarm on and current needs met.      Therapy Documentation Precautions:  Precautions Precautions: Other (comment) Precaution Comments: trach recently decannulated, watch O2 and HR Restrictions Weight Bearing Restrictions: No General:   Vital Signs: Therapy Vitals Temp: 98 F (36.7 C) Pulse Rate: (!) 117 Resp: 16 BP: 116/84 Patient Position (if appropriate): Sitting Oxygen Therapy SpO2: 98 % O2 Device: Nasal Cannula O2 Flow Rate (L/min): 2 L/min  Therapy/Group: Individual Therapy      , PTA  04/28/2019, 4:05 PM  

## 2019-04-28 NOTE — Progress Notes (Signed)
[place] PHYSICAL MEDICINE & REHABILITATION PROGRESS NOTE  Subjective/Complaints: Patient seen laying in bed this morning.  He states he slept well overnight.  He denies complaints.  ROS: Limited due to language, but appears to deny chest pain or shortness of breath.  Objective: Vital Signs: Blood pressure 114/81, pulse 86, temperature 98.4 F (36.9 C), temperature source Oral, resp. rate 16, height 5\' 5"  (1.651 m), weight 67.1 kg, SpO2 96 %. No results found. Recent Labs    04/26/19 0844  WBC 8.0  HGB 10.1*  HCT 33.1*  PLT 312   Recent Labs    04/26/19 0844  NA 135  K 4.1  CL 98  CO2 26  GLUCOSE 257*  BUN 20  CREATININE 0.99  CALCIUM 9.6    Physical Exam: BP 114/81 (BP Location: Left Arm)   Pulse 86   Temp 98.4 F (36.9 C) (Oral)   Resp 16   Ht 5\' 5"  (1.651 m)   Wt 67.1 kg   SpO2 96%   BMI 24.62 kg/m  Constitutional: No distress . Vital signs reviewed. HENT: Normocephalic.  Atraumatic. Neck: + Stoma. Eyes: EOMI.  No discharge. Cardiovascular: No JVD. Respiratory: Normal effort.  +[place]. GI: Non-distended. Musc: No edema or tenderness in extremities. Neurological: Alert Makes good eye contact with examiner.  Follows commands with gestural cues Motor:  Grossly 4-4+/5 throughout, improving Skin: Warm and dry.  Intact. Psychiatric: He has a normal mood and affect. Hisbehavior is normal.  Assessment/Plan: 1. Functional deficits secondary to debility status post COVID-19 which require 3+ hours per day of interdisciplinary therapy in a comprehensive inpatient rehab setting.  Physiatrist is providing close team supervision and 24 hour management of active medical problems listed below.  Physiatrist and rehab team continue to assess barriers to discharge/monitor patient progress toward functional and medical goals  Care Tool:  Bathing    Body parts bathed by patient: Right arm, Chest, Left arm, Abdomen, Front perineal area, Right upper leg, Left upper  leg, Face   Body parts bathed by helper: Left lower leg, Right lower leg, Buttocks     Bathing assist Assist Level: Minimal Assistance - Patient > 75%     Upper Body Dressing/Undressing Upper body dressing   What is the patient wearing?: Pull over shirt    Upper body assist Assist Level: Contact Guard/Touching assist    Lower Body Dressing/Undressing Lower body dressing      What is the patient wearing?: Pants, Underwear/pull up     Lower body assist Assist for lower body dressing: Minimal Assistance - Patient > 75%     Toileting Toileting    Toileting assist Assist for toileting: Contact Guard/Touching assist     Transfers Chair/bed transfer  Transfers assist     Chair/bed transfer assist level: Supervision/Verbal cueing     Locomotion Ambulation   Ambulation assist      Assist level: Supervision/Verbal cueing Assistive device: No Device Max distance: 100'   Walk 10 feet activity   Assist     Assist level: Supervision/Verbal cueing Assistive device: No Device   Walk 50 feet activity   Assist    Assist level: Supervision/Verbal cueing Assistive device: No Device    Walk 150 feet activity   Assist Walk 150 feet activity did not occur: Safety/medical concerns  Assist level: Supervision/Verbal cueing Assistive device: No Device    Walk 10 feet on uneven surface  activity   Assist Walk 10 feet on uneven surfaces activity did not occur: Safety/medical concerns  Wheelchair     Assist Will patient use wheelchair at discharge?: No Type of Wheelchair: Manual Wheelchair activity did not occur: N/A  Wheelchair assist level: Supervision/Verbal cueing, Set up assist Max wheelchair distance: 332ft    Wheelchair 50 feet with 2 turns activity    Assist    Wheelchair 50 feet with 2 turns activity did not occur: N/A   Assist Level: Supervision/Verbal cueing, Set up assist   Wheelchair 150 feet activity      Assist Wheelchair 150 feet activity did not occur: N/A   Assist Level: Supervision/Verbal cueing, Set up assist      Medical Problem List and Plan: 1.Debilitysecondary to ARDS due to SARS COVID-19 infection. Repeat COVID test x2-  Continue CIR  Team conference today to discuss current and goals and coordination of care, home and environmental barriers, and discharge planning with nursing, case manager, and therapies.   Discussed with owner of forearm, where patient is to return regarding needs at discharge, accommodations, prognosis, ability to travel internationally. 2. Antithrombotics: -DVT/anticoagulation:CT angiogram of chest not completed due to AKI. Lower extremity Dopplers negative  Continue Xarelto  antiplatelet therapy: See above 3. Pain Management:Oxycodone as needed 4. Mood:Klonopin 0.25 mg daily -antipsychotic agents: N/A 5. Neuropsych: This patientiscapable of making decisions on hisown behalf. 6. Skin/Wound Care:Routine skin care 7. Fluids/Electrolytes/Nutrition:Routine in and outs 8.AKI. Status post permacath 03/17/2019. Patient did initially receive intermittent dialysis.   Cr.  0.99 on 6/22, improving  Cont to monitor 9.Acute blood loss anemia. Patient has been transfused during hospital course. Continue iron supplement  Hb 10.1 on 6/22  Cont to monitor 10. Respiratory failure   Tracheostomy 03/16/2019.  Decannulated on 6/16  Wean supplemental oxygen as tolerated, continues to require on 6/24 11.Intermittent sinus tachycardia.   Continue Lopressor 25 mg twice daily  Secondary to endurance, overall improving on 6/24   Vitals:   04/27/19 1940 04/28/19 0606  BP: 105/70 114/81  Pulse: (!) 104 86  Resp: 16   Temp: 98 F (36.7 C) 98.4 F (36.9 C)  SpO2: 95% 96%   12.Diabetes mellitus.Hemoglobin A1c 5.5.   Lantus insulin 12 units twice daily.   NovoLog6 U with meal will increase to 7U   Labile on  6/24 CBG (last 3)  Recent Labs    04/27/19 1645 04/27/19 2108 04/28/19 0651  GLUCAP 288* 165* 100*    LOS: 9 days A FACE TO FACE EVALUATION WAS PERFORMED  Jonathan [name] Lorie Phenix 04/28/2019, 8:05 AM

## 2019-04-28 NOTE — Progress Notes (Signed)
Occupational Therapy Session Note  Patient Details  Name: Jonathan [name] MRN: [id number] Date of Birth: 1971-01-17  Today's Date: 04/28/2019 OT Individual Time: 4098-1191 OT Individual Time Calculation (min): 57 min    Short Term Goals: Week 1:  OT Short Term Goal 1 (Week 1): Pt will tolerate standing at the sink for BADL tasks for 5 minutes without rest break  OT Short Term Goal 1 - Progress (Week 1): Met OT Short Term Goal 2 (Week 1): Pt will maintain SpO2 above 90% on 4L during BADL tasks. OT Short Term Goal 2 - Progress (Week 1): Progressing toward goal OT Short Term Goal 3 (Week 1): Pt will complete LB dressing with supervision OT Short Term Goal 3 - Progress (Week 1): Met  Skilled Therapeutic Interventions/Progress Updates:    1:1. Interpreter present. Pt received in bed with no pain reported and agreeable to shower. Pt completes ambulatory transfer throughout session with supervision. Pt bathes, dresses and grooms in set up with set up. Pt completes standing activity with supervision standing on compliant surface while playing uno stacko (similar to [name]). Pt able to follow rules of novel game with min VC. Pt loads laundry with min VC for understanding controls of appliance. Exited session with ptseated in w/c, call light in reach and all needs met  Therapy Documentation Precautions:  Precautions Precautions: Other (comment) Precaution Comments: trach recently decannulated, watch O2 and HR Restrictions Weight Bearing Restrictions: No General:   Vital Signs: Therapy Vitals Temp: 98.4 F (36.9 C) Temp Source: Oral Pulse Rate: 89 BP: 117/78 Patient Position (if appropriate): Lying Oxygen Therapy SpO2: 96 % O2 Device: Room Air Pain: Pain Assessment Pain Scale: 0-10 Pain Score: 0-No pain ADL: ADL Eating: Set up Grooming: Contact guard Upper Body Bathing: Contact guard Lower Body Bathing: Minimal assistance Upper Body Dressing:  Contact guard Lower Body Dressing: Minimal assistance Toileting: Contact guard Toilet Transfer: Contact guard Contractor    Praxis   Exercises:   Other Treatments:     Therapy/Group: Individual Therapy  Tonny Branch 04/28/2019, 9:52 AM

## 2019-04-28 NOTE — Progress Notes (Signed)
Social Work Patient ID: Wren Gallaga, male   DOB: April 21, 1971, 48 y.o.   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  I have contacted the the [employer] Rep and left VM requesting she contact me ASAP to discuss any other possible options.  Ernestine Langworthy, LCSW

## 2019-04-28 NOTE — Progress Notes (Signed)
Physical Therapy Session Note  Patient Details  Name: Jonathan [name] MRN: [id number] Date of Birth: 1971-07-15  Today's Date: 04/28/2019 PT Individual Time: 1100-1200 PT Individual Time Calculation ([name]): 60 [name]   Short Term Goals: Week 2:  PT Short Term Goal 1 (Week 2): =LTGs due to ELOS  Skilled Therapeutic Interventions/Progress Updates:   Gait training instructed by PT to and from day room with supervision assist. Pt reports mild SOB at end of 319f.  and balance training while engaged in Wii sports x 20 mintues with supervision assist. SpO2 assessed throughout at 2-0 [name] with SpO2 94%. Gait training without O@ x 1561f desat to 72% at end of bout with only mild reports of SOB. additional gait training on 2L/[name] x 15058fith desat to 83% with constant SpO2 assessment.Patient returned to room and left sitting in WC [facility] call bell in reach and all needs met.        Therapy Documentation Precautions:  Precautions Precautions: Other (comment) Precaution Comments: trach recently decannulated, watch O2 and HR Restrictions Weight Bearing Restrictions: No Vital Signs: Therapy Vitals Pulse Rate: 89 BP: 117/78 Pain: Pain Assessment Pain Scale: 0-10 Pain Score: 0-No pain    Therapy/Group: Individual Therapy  AusLorie Phenix24/2020, 12:00 PM

## 2019-04-29 ENCOUNTER — Inpatient Hospital Stay (HOSPITAL_COMMUNITY): Payer: Self-pay | Admitting: Occupational Therapy

## 2019-04-29 ENCOUNTER — Inpatient Hospital Stay (HOSPITAL_COMMUNITY): Payer: Self-pay | Admitting: Physical Therapy

## 2019-04-29 LAB — GLUCOSE, CAPILLARY
Glucose-Capillary: 95 mg/dL (ref 70–99)
Glucose-Capillary: 136 mg/dL — ABNORMAL HIGH (ref 70–99)
Glucose-Capillary: 64 mg/dL — ABNORMAL LOW (ref 70–99)
Glucose-Capillary: 181 mg/dL — ABNORMAL HIGH (ref 70–99)
Glucose-Capillary: 167 mg/dL — ABNORMAL HIGH (ref 70–99)
Glucose-Capillary: 145 mg/dL — ABNORMAL HIGH (ref 70–99)

## 2019-04-29 NOTE — Discharge Summary (Addendum)
Physician Discharge Summary  Patient ID: Jonathan [name] MRN: [id number] DOB/AGE: 02/28/1971 48 y.o.  Admit date: 04/19/2019 Discharge date: 04/30/2019  Discharge Diagnoses:  Active Problems:   Debility   Acute blood loss anemia   Tracheostomy status (HCC)   Tachycardia   Diabetes mellitus type 2 in nonobese [facility])   Acute pulmonary embolism (HCC)   Sinus tachycardia   Labile blood glucose   History of 2019 novel coronavirus disease (COVID-19)   Acute respiratory distress syndrome (ARDS) due to COVID-19 virus   Discharged Condition: Stable  Significant Diagnostic Studies: Dg Abd 1 View  Result Date: 04/03/2019 CLINICAL DATA:  Hypoxia, nausea, vomiting EXAM: ABDOMEN - 1 VIEW COMPARISON:  04/02/2019 FINDINGS: Oral contrast material noted within the colon which is decompressed. Nonobstructive bowel gas pattern. No organomegaly or free air. IMPRESSION: No evidence of bowel obstruction or free air.  No acute findings. Electronically Signed   By: Rolm Baptise M.D.   On: 04/03/2019 21:20   Dg Abd 1 View  Result Date: 03/31/2019 CLINICAL DATA:  Check gastric catheter placement EXAM: ABDOMEN - 1 VIEW COMPARISON:  03/27/2019 FINDINGS: Gastric catheter is noted within the stomach. Proximal side port lies at the gastroesophageal junction. This could be advanced a few cm further into the stomach. IMPRESSION: Gastric catheter within the stomach although could be advanced further into the stomach. Electronically Signed   By: Inez Catalina M.D.   On: 03/31/2019 03:21   Ct Head Wo Contrast  Result Date: 03/31/2019 CLINICAL DATA:  Who is fall with forehead laceration, initial encounter EXAM: CT HEAD WITHOUT CONTRAST TECHNIQUE: Contiguous axial images were obtained from the base of the skull through the vertex without intravenous contrast. COMPARISON:  None. FINDINGS: Brain: No evidence of acute infarction, hemorrhage, hydrocephalus, extra-axial collection or mass lesion/mass effect. Vascular: No  hyperdense vessel or unexpected calcification. Skull: Normal. Negative for fracture or focal lesion. Sinuses/Orbits: No acute finding. Other: Mild soft tissue swelling is noted in the right forehead consistent with the recent injury. IMPRESSION: Soft tissue swelling in the right forehead consistent with the recent injury. No acute intracranial abnormality is noted. Electronically Signed   By: Inez Catalina M.D.   On: 03/31/2019 01:36   Dg Chest Port 1 View  Result Date: 04/14/2019 CLINICAL DATA:  Shortness of breath and cough EXAM: PORTABLE CHEST 1 VIEW COMPARISON:  04/12/2019 FINDINGS: Cardiac shadow is within normal limits. Tracheostomy tube remains in place. The overall inspiratory effort is poor. Patchy infiltrates are again identified but mildly improved. No new focal abnormality is noted. IMPRESSION: Slight improved aeration when compared with the previous day. Electronically Signed   By: Inez Catalina M.D.   On: 04/14/2019 09:11   Dg Chest Port 1 View  Result Date: 04/12/2019 CLINICAL DATA:  Worsening shortness of breath EXAM: PORTABLE CHEST 1 VIEW COMPARISON:  04/03/2019 FINDINGS: Tracheostomy remains in place. Central line is been removed. Heart size is normal allowing for technical factors. Widespread patchy bilateral pulmonary infiltrates persist. These are similar or slightly progressed. No visible effusion. IMPRESSION: Persistent bilateral patchy pulmonary infiltrates, similar or slightly worsened. Electronically Signed   By: Nelson Chimes M.D.   On: 04/12/2019 19:20   Dg Chest Port 1 View  Result Date: 04/03/2019 CLINICAL DATA:  Hypoxia, nausea, vomiting EXAM: PORTABLE CHEST 1 VIEW COMPARISON:  03/25/2019 FINDINGS: Patchy bilateral airspace disease again noted, minimally improved since prior study. Low lung volumes. Right dialysis catheter remains in the right atrium. Tracheostomy tube is unchanged. Interval removal of NG  tube. Heart is normal size. IMPRESSION: Low lung volumes. Patchy  bilateral airspace disease, slightly improved. Electronically Signed   By: Rolm Baptise M.D.   On: 04/03/2019 21:20   Dg Abd Portable 1v  Result Date: 04/02/2019 CLINICAL DATA:  Abdominal pain EXAM: PORTABLE ABDOMEN - 1 VIEW COMPARISON:  03/31/2019 FINDINGS: Nonobstructed bowel gas pattern with residual contrast material in nondilated small bowel and colon. This limits evaluation for radiopaque calculi. IMPRESSION: Nonobstructed gas pattern with residual contrast material in the large and small bowel Electronically Signed   By: Donavan Foil M.D.   On: 04/02/2019 20:41   Dg Abd Portable 1v  Result Date: 03/31/2019 CLINICAL DATA:  Nasogastric placement EXAM: PORTABLE ABDOMEN - 1 VIEW COMPARISON:  Earlier same day FINDINGS: Two views show any is a gastric tube entering the stomach with its tip in the fundus. Contrast present throughout a normal appearing colon. IMPRESSION: Nasogastric tube tip in the gastric fundus. Electronically Signed   By: Nelson Chimes M.D.   On: 03/31/2019 12:19   Dg Swallowing Func-speech Pathology  Result Date: 04/12/2019 Objective Swallowing Evaluation: Type of Study: MBS-Modified Barium Swallow Study  Patient Details Name: Jonathan [name] MRN: [id number] Date of Birth: 30-Mar-1971 Today's Date: 04/12/2019 Time: SLP Start Time (ACUTE ONLY): 1200 -SLP Stop Time (ACUTE ONLY): 1220 SLP Time Calculation (min) (ACUTE ONLY): 20 min Past Medical History: No past medical history on file.  HPI: Pt is a 48 y.o. male admitted on 02/23/19 with AMS, O2 sats in the ED were 50%.  Pt dx with COVID 19 and resultant ARDS.  He was intubated 4/21-5/11; reintubated and then trached on 5/12. His course was complicated by acute kidney injury requiring CVVHD.  PMH includes DM2, HTN.. Pt transferred from [place] to [facility] on 5/26; now requiring intermittent HD.  Subjective: pleasant, cooperative Assessment / Plan / Recommendation CHL IP CLINICAL IMPRESSIONS 04/12/2019 Clinical Impression Paitent presents with a  signfiicantly improved swallow as compared to MBS on 5/26. He did not exhibit any penetration or aspiration with any of the tested boluses (thin, nectar, puree, regular, tablet)and UES opening and bolus transit was [facility]. The only deficit noted was trace-mild vallecular residuals post swallows of puree and regular solids, but with full clearance from pharynx post subsequent swallows. PMV was in place during entire test. SLP Visit Diagnosis Dysphagia, pharyngeal phase (R13.13) Attention and concentration deficit following -- Frontal lobe and executive function deficit following -- Impact on safety and function No limitations;Mild aspiration risk   CHL IP TREATMENT RECOMMENDATION 04/12/2019 Treatment Recommendations Therapy as outlined in treatment plan below   Prognosis 04/12/2019 Prognosis for Safe Diet Advancement Good Barriers to Reach Goals -- Barriers/Prognosis Comment -- CHL IP DIET RECOMMENDATION 04/12/2019 SLP Diet Recommendations Regular solids;Thin liquid Liquid Administration via Straw;Cup Medication Administration Whole meds with liquid Compensations Minimize environmental distractions;Slow rate;Small sips/bites Postural Changes Seated upright at 90 degrees   CHL IP OTHER RECOMMENDATIONS 04/12/2019 Recommended Consults -- Oral Care Recommendations Oral care BID Other Recommendations Place PMSV during PO intake   CHL IP FOLLOW UP RECOMMENDATIONS 04/12/2019 Follow up Recommendations Inpatient Rehab   CHL IP FREQUENCY AND DURATION 04/12/2019 Speech Therapy Frequency (ACUTE ONLY) min 2x/week Treatment Duration 2 weeks      CHL IP ORAL PHASE 04/12/2019 Oral Phase WFL Oral - Pudding Teaspoon -- Oral - Pudding Cup -- Oral - Honey Teaspoon -- Oral - Honey Cup -- Oral - Nectar Teaspoon -- Oral - Nectar Cup -- Oral - Nectar Straw -- Oral - Thin  Teaspoon -- Oral - Thin Cup -- Oral - Thin Straw -- Oral - Puree -- Oral - Mech Soft -- Oral - Regular -- Oral - Multi-Consistency -- Oral - Pill -- Oral Phase - Comment --  CHL IP  PHARYNGEAL PHASE 04/12/2019 Pharyngeal Phase Impaired Pharyngeal- Pudding Teaspoon -- Pharyngeal -- Pharyngeal- Pudding Cup -- Pharyngeal -- Pharyngeal- Honey Teaspoon -- Pharyngeal -- Pharyngeal- Honey Cup -- Pharyngeal -- Pharyngeal- Nectar Teaspoon -- Pharyngeal -- Pharyngeal- Nectar Cup -- Pharyngeal -- Pharyngeal- Nectar Straw NT Pharyngeal -- Pharyngeal- Thin Teaspoon NT Pharyngeal -- Pharyngeal- Thin Cup WFL Pharyngeal -- Pharyngeal- Thin Straw NT Pharyngeal -- Pharyngeal- Puree WFL Pharyngeal -- Pharyngeal- Mechanical Soft Pharyngeal residue - valleculae Pharyngeal -- Pharyngeal- Regular Pharyngeal residue - valleculae Pharyngeal -- Pharyngeal- Multi-consistency -- Pharyngeal -- Pharyngeal- Pill WFL Pharyngeal -- Pharyngeal Comment --  CHL IP CERVICAL ESOPHAGEAL PHASE 04/12/2019 Cervical Esophageal Phase WFL Pudding Teaspoon -- Pudding Cup -- Honey Teaspoon -- Honey Cup -- Nectar Teaspoon -- Nectar Cup -- Nectar Straw -- Thin Teaspoon -- Thin Cup -- Thin Straw -- Puree -- Mechanical Soft -- Regular -- Multi-consistency -- Pill -- Cervical Esophageal Comment -- [name] 04/12/2019, 1:59 PM    Sonia Baller, MA, CCC-SLP Speech Therapy [facility] Acute Rehab Pager: [phone]          Dg Swallowing Func-speech Pathology  Result Date: 04/02/2019 Objective Swallowing Evaluation: Type of Study: MBS-Modified Barium Swallow Study  Patient Details Name: Jonathan [name] MRN: [id number] Date of Birth: 23-Jun-1971 Today's Date: 04/02/2019 Time: SLP Start Time (ACUTE ONLY): 1600 -SLP Stop Time (ACUTE ONLY): 1630 SLP Time Calculation (min) (ACUTE ONLY): 30 min Past Medical History: No past medical history on file. Past Surgical History:  The histories are not reviewed yet. Please review them in the "History" navigator section and refresh this [place]. HPI: Pt is a 48 y.o. male admitted on 02/23/19 with AMS, O2 sats in the ED were 50%.  Pt dx with COVID 19 and resultant ARDS.  He was intubated 4/21-5/11; reintubated  and then trached on 5/12. His course is complicated by acute kidney injury requiring CVVHD.  PMH includes DM2, HTN.  Subjective: pt was upright in his chair, cooperative Assessment / Plan / Recommendation CHL IP CLINICAL IMPRESSIONS 04/02/2019 Clinical Impression Pt has a mild-moderate oropharyngeal dysphagia, with testing done without PMV in place as it has not been relocated s/p transfer to [facility]. Despite this, he demonstrates good airway protection with solids and nectar thick liquids. Thin liquids intermittently entered the airway in trace-mild amounts, but aspiration was silent. His swallow overall appeared mildly slowed and with reduced strenght. Lingual and vallecular residue was present, with pt performing a second swallow spontaneously to reduce residue in both locations. UES is tight with mild retrograde flow within the cervical esophagus that does not re-enter the pharynx during this study. Recommend starting with Dys 1 diet and nectar thick liquids. PMV not needed for intake, but SLP will f/u with pt to replace and continue to facilitate communication.  SLP Visit Diagnosis Dysphagia, oropharyngeal phase (R13.12) Attention and concentration deficit following -- Frontal lobe and executive function deficit following -- Impact on safety and function Mild aspiration risk   CHL IP TREATMENT RECOMMENDATION 04/02/2019 Treatment Recommendations Therapy as outlined in treatment plan below   Prognosis 04/02/2019 Prognosis for Safe Diet Advancement Good Barriers to Reach Goals -- Barriers/Prognosis Comment -- CHL IP DIET RECOMMENDATION 04/02/2019 SLP Diet Recommendations Dysphagia 1 (Puree) solids;Nectar thick liquid Liquid Administration via Cup;Straw Medication  Administration Crushed with puree Compensations Slow rate;Small sips/bites;Multiple dry swallows after each bite/sip Postural Changes Seated upright at 90 degrees;Remain semi-upright after after feeds/meals (Comment)   CHL IP OTHER RECOMMENDATIONS 04/02/2019  Recommended Consults -- Oral Care Recommendations Oral care BID Other Recommendations Order thickener from pharmacy;Prohibited food (jello, ice cream, thin soups);Remove water pitcher   CHL IP FOLLOW UP RECOMMENDATIONS 04/02/2019 Follow up Recommendations Inpatient Rehab   CHL IP FREQUENCY AND DURATION 04/02/2019 Speech Therapy Frequency (ACUTE ONLY) min 2x/week Treatment Duration 2 weeks      CHL IP ORAL PHASE 04/02/2019 Oral Phase Impaired Oral - Pudding Teaspoon -- Oral - Pudding Cup -- Oral - Honey Teaspoon -- Oral - Honey Cup -- Oral - Nectar Teaspoon -- Oral - Nectar Cup -- Oral - Nectar Straw Weak lingual manipulation;Lingual/palatal residue Oral - Thin Teaspoon Weak lingual manipulation;Lingual/palatal residue Oral - Thin Cup -- Oral - Thin Straw Weak lingual manipulation;Lingual/palatal residue Oral - Puree Weak lingual manipulation;Lingual/palatal residue Oral - Mech Soft Weak lingual manipulation;Lingual/palatal residue Oral - Regular -- Oral - Multi-Consistency -- Oral - Pill -- Oral Phase - Comment --  CHL IP PHARYNGEAL PHASE 04/02/2019 Pharyngeal Phase Impaired Pharyngeal- Pudding Teaspoon -- Pharyngeal -- Pharyngeal- Pudding Cup -- Pharyngeal -- Pharyngeal- Honey Teaspoon -- Pharyngeal -- Pharyngeal- Honey Cup -- Pharyngeal -- Pharyngeal- Nectar Teaspoon -- Pharyngeal -- Pharyngeal- Nectar Cup -- Pharyngeal -- Pharyngeal- Nectar Straw Reduced anterior laryngeal mobility;Reduced laryngeal elevation;Reduced tongue base retraction;Reduced airway/laryngeal closure;Pharyngeal residue - valleculae Pharyngeal -- Pharyngeal- Thin Teaspoon Reduced anterior laryngeal mobility;Reduced laryngeal elevation;Reduced tongue base retraction;Reduced airway/laryngeal closure;Pharyngeal residue - valleculae Pharyngeal -- Pharyngeal- Thin Cup -- Pharyngeal -- Pharyngeal- Thin Straw Reduced anterior laryngeal mobility;Reduced laryngeal elevation;Reduced tongue base retraction;Reduced airway/laryngeal closure;Pharyngeal residue  - valleculae;Penetration/Aspiration during swallow Pharyngeal Material enters airway, passes BELOW cords without attempt by patient to eject out (silent aspiration) Pharyngeal- Puree Reduced anterior laryngeal mobility;Reduced laryngeal elevation;Reduced tongue base retraction;Reduced airway/laryngeal closure;Pharyngeal residue - valleculae Pharyngeal -- Pharyngeal- Mechanical Soft Reduced anterior laryngeal mobility;Reduced laryngeal elevation;Reduced tongue base retraction;Reduced airway/laryngeal closure;Pharyngeal residue - valleculae Pharyngeal -- Pharyngeal- Regular -- Pharyngeal -- Pharyngeal- Multi-consistency -- Pharyngeal -- Pharyngeal- Pill -- Pharyngeal -- Pharyngeal Comment --  CHL IP CERVICAL ESOPHAGEAL PHASE 04/02/2019 Cervical Esophageal Phase Impaired Pudding Teaspoon -- Pudding Cup -- Honey Teaspoon -- Honey Cup -- Nectar Teaspoon -- Nectar Cup -- Nectar Straw Reduced cricopharyngeal relaxation;Esophageal backflow into cervical esophagus Thin Teaspoon Reduced cricopharyngeal relaxation;Esophageal backflow into cervical esophagus Thin Cup -- Thin Straw Reduced cricopharyngeal relaxation;Esophageal backflow into cervical esophagus Puree Reduced cricopharyngeal relaxation Mechanical Soft Reduced cricopharyngeal relaxation Regular -- Multi-consistency -- Pill -- Cervical Esophageal Comment -- Venita Sheffield Nix 04/02/2019, 4:43 PM  Pollyann Glen, M.A. CCC-SLP Acute Rehabilitation Services Pager [phone] Office [phone]              Labs:  Basic Metabolic Panel: Recent Labs  Lab 04/26/19 0844  NA 135  K 4.1  CL 98  CO2 26  GLUCOSE 257*  BUN 20  CREATININE 0.99  CALCIUM 9.6    CBC: Recent Labs  Lab 04/24/19 1025 04/26/19 0844  WBC 8.3 8.0  NEUTROABS 3.2  --   HGB 10.0* 10.1*  HCT 33.0* 33.1*  MCV 93.8 94.6  PLT 355 312    CBG: Recent Labs  Lab 04/28/19 0651 04/28/19 1233 04/28/19 1729 04/28/19 2048 04/29/19 0700  GLUCAP 100* 99 128* 133* 95    Brief HPI:   Jonathan [name] is a 48 year old Spanish-speaking right-handed male history of diabetes mellitus  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  Presented 02/23/2019 with increasing shortness of breath requiring intubation for airway protection follow-up critical care medicine for severe respiratory failure requiring ventilatory support related to COVID-19 infection.  Patient developed severe ATN requiring CRRT with prolonged respiratory failure extubated later underwent tracheostomy 03/16/2019 per Dr. Nelda Marseille as well as placement of permacath 03/17/2019 for renal failure requiring intermittent dialysis.  Renal ultrasound showed no hydronephrosis.  On 03/18/2019 worsening hypoxemia placed on intravenous heparin for suspect pulmonary emboli.  CT angiogram could not be obtained at that time due to severe AKI.  Transition to Eliquis but was discontinued due to drop in hemoglobin required 2 units packed red blood cells remained on Lovenox.  Lower extremity Dopplers negative for DVT.  He was transferred 03/30/2019 from [place] to [facility].  Repeat COVID testing negative x2 on 04/02/2019 and 04/03/2019.  Diet slowly advanced.  Currently on a #4 cuffless trach with PMV as directed with capping trials.  Renal function stabilized 1.47 no longer needing dialysis.  Patient was admitted for a comprehensive rehab program  Hospital Course: Jonathan [name] was admitted to rehab 04/19/2019 for inpatient therapies to consist of PT, ST and OT at least three hours five days a week. Past admission physiatrist, therapy team and rehab RN have worked together to provide customized collaborative inpatient rehab.  Pertaining to patient debility related to COVID.  Repeat COVID testing x2-.  Antibiotic therapy completed.  DVT prophylaxis CT angiogram of chest not completed due to AKI.  Lower extremity Dopplers negative.  He was transitioned to Xarelto.  Pain management use of oxycodone as needed and discontinued.   Mood stabilization with Klonopin.  AKI improved latest creatinine 0.99 no longer in need of dialysis.  Acute blood loss anemia 10.1 and stable.  Respiratory failure tracheostomy 03/16/2019 decannulated 04/20/2019 supplemental oxygen was needed plan on discharge.  Intermittent sinus tachycardia low-dose beta-blocker.  Blood sugars well controlled his insulin was discontinued transition to oral agents.  Physical exam.  Blood pressure 131/86 pulse 93 temperature 98.6 respirations 30 oxygen saturation 96% room air Constitutional.  Oriented with interpreter HEENT Head normocephalic Eyes pupils round reactive to light Neck supple nontender Cardiac normal rate and rhythm no murmur Respiratory.  Effort normal no wheeze scattered rhonchi GI.  Soft nontender without rebound Neurological.  Alert and oriented strength 4 out of 5  Rehab course: During patient's stay in rehab weekly team conferences were held to monitor patient's progress, set goals and discuss barriers to discharge. At admission, patient required minimal assist stand pivot transfers minimal assist sit to stand minimal guard ambulate 140 feet rolling walker.  Minimal assist ADLs.  He  has had improvement in activity tolerance, balance, postural control as well as ability to compensate for deficits. He has had improvement in functional use RUE/LUE  and RLE/LLE as well as improvement in awareness.  Patient ambulates to and from the day room supervision without assistive device 4 L oxygen oxygen saturation greater than 90% weaned to 3 L.  Gather his belongings for activities delivered and homemaking.  Social work working with Optician, dispensing who is from the farm union representative to to determine discharge options       Disposition: Discharge to home   Diet: Regular  Special Instructions: No driving or smoking  3 L oxygen while ambulating  Medications at discharge. 1.  Klonopin 0.25 mg daily 2  Lopressor 25 mg twice daily 3Xoponex 2 puffs  every 6  hours as needed 4.  Xarelto 20 mg daily 5.  Glucophage 500 mg twice daily  Discharge Instructions    MyChart COVID-19 home monitoring program   Complete by: Apr 20, 2019    Is the patient willing to use the [place] for home monitoring?: No      Follow-up Information    Jamse Arn, MD Follow up.   Specialty: Physical Medicine and Rehabilitation Why: Office to call for appointment Contact information: [address] [place] [address] [phone]        Rush Farmer, MD Follow up.   Specialty: Pulmonary Disease Why: Call for appointment Contact information: [address] [place] [address] [phone]           Signed: Cathlyn Parsons 04/29/2019, 2:23 PM Patient seen and examined by me on day of discharge. Delice Lesch, MD, ABPMR

## 2019-04-29 NOTE — Progress Notes (Signed)
Social Work Patient ID: Jonathan [name], male   DOB: 02-Sep-1971, 48 y.o.   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  He is aware that I am working with Jonathan [name] who is the [employer] Rep to determine other options for his housing.  He has questions about getting his belongings from the farm, getting transportation to follow up appointments and longer term plans regarding his return to [place].  I have explained that I, too, have those questions and am awaiting return contact from [place] to confirm d/c location and address these other concerns.  Have left two VM so far this morning with no return call yet.   Muhanad Torosyan, LCSW

## 2019-04-29 NOTE — Progress Notes (Signed)
Physical Therapy Session Note  Patient Details  Name: Jonathan [name] MRN: [id number] Date of Birth: 04-Jul-1971  Today's Date: 04/29/2019 PT Individual Time: 0900-1000 AND 1100-1155 PT Individual Time Calculation (min): 60 min AND 55 min  Short Term Goals: Week 2:  PT Short Term Goal 1 (Week 2): =LTGs due to ELOS  Skilled Therapeutic Interventions/Progress Updates:   Session 1:  Pt in supine and agreeable to therapy, denies pain. Supine>sit w/ independence. Ambulated to/from day room w/ supervision w/o AD, 1 rest break each way. Pt on 4 L/min O2 and >90%. Performed O2 assessment for home oxygen use, see detailed below. Pt maintaining saturation on 3 L/min O2, and did not have delayed desaturation w/ rest break, this is an improvement from earlier in week. Educated pt on this improvement and his overall endurance improvement. NuStep 5 min and 3 min @ level 3 for global strengthening and endurance training, 3 L/min w/ no signs/symptoms of desaturation. 5/10 on modified RPE scale. Returned to room and ended session in w/c, all needs in reach.   Room air @ rest - 90% Room air w/ gait - 72%, required 2 L/min to return to >90% 3 L/min w/ gait - 94%  Session 2:  Pt in recliner and agreeable to therapy, no c/o pain. Ambulated to/from therapy gym w/ supervision w/o AD. Pt on 3 L/min O2 throughout session, worked on standing balance and LE strengthening. Stood on airex pad to perform dynamic balance tasks including ball toss w/ progressively lower BOS, LE exercises w/o UE support, and single leg balance tasks. Dynamic gait tasks over firm ground including ladder drills and ambulating while carrying something <5 lbs. LE exercises included mini squats 3x10 and heel raises 3x10. Alternating 6" step taps w/o UE support, 3x30 sec. CGA-close supervision for all balance tasks, pt correcting minor LOB w/o physical assistance. Returned to room and ended session in recliner, all needs in reach. 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 Pt verbalized understanding and in agreement.   Therapy Documentation Precautions:  Precautions Precautions: Other (comment) Precaution Comments: trach recently decannulated, watch O2 and HR Restrictions Weight Bearing Restrictions: No Vital Signs:    Therapy/Group: Individual Therapy  Prudence Heiny K Aleeyah Bensen 04/29/2019, 11:09 AM

## 2019-04-29 NOTE — Discharge Instructions (Signed)
Inpatient Rehab Discharge Instructions  Jonathan [name] Discharge date and time: No discharge date for patient encounter.   Activities/Precautions/ Functional Status: Activity: activity as tolerated Diet: regular diet Wound Care: keep wound clean and dry Functional status:  ___ No restrictions     ___ Walk up steps independently ___ 24/7 supervision/assistance   ___ Walk up steps with assistance ___ Intermittent supervision/assistance  ___ Bathe/dress independently ___ Walk with walker     _x__ Bathe/dress with assistance ___ Walk Independently    ___ Shower independently ___ Walk with assistance    ___ Shower with assistance ___ No alcohol     ___ Return to work/school ________  Special Instructions:  No smoking driving or alcohol  My questions have been answered and I understand these instructions. I will adhere to these goals and the provided educational materials after my discharge from the hospital.  Patient/Caregiver Signature _______________________________ Date __________  Clinician Signature _______________________________________ Date __________  Please bring this form and your medication list with you to all your follow-up doctor's appointments.

## 2019-04-29 NOTE — Plan of Care (Signed)
  Problem: RH SKIN INTEGRITY Goal: RH STG MAINTAIN SKIN INTEGRITY WITH ASSISTANCE Description: STG Maintain Skin Integrity With Assistance. Mod i Outcome: Progressing   Problem: RH SAFETY Goal: RH STG ADHERE TO SAFETY PRECAUTIONS W/ASSISTANCE/DEVICE Description: STG Adhere to Safety Precautions With Assistance/Device. Mod I Outcome: Progressing   Problem: RH KNOWLEDGE DEFICIT GENERAL Goal: RH STG INCREASE KNOWLEDGE OF SELF CARE AFTER HOSPITALIZATION Description: Patient will be able to describe self care following rehab including medications for HTN and T2DM with cues/handouts Outcome: Progressing   Problem: Consults Goal: RH GENERAL PATIENT EDUCATION Description: See Patient Education module for education specifics. Outcome: Progressing

## 2019-04-29 NOTE — Patient Care Conference (Signed)
Inpatient RehabilitationTeam Conference and Plan of Care Update Date: 04/28/2019   Time: 2:35 PM    Patient Name: [place] Record Number: [id number]  Date of Birth: 18-Jan-1971 Sex: Male         Room/Bed: 4M01C/4M01C-01 Payor Info: Payor: MEDICAID POTENTIAL / Plan: MEDICAID POTENTIAL / Product Type: *No Product type* /    Admitting Diagnosis: 6. ABI Team Debility, ARDS; 11-13days  Admit Date/Time:  04/19/2019  3:56 PM Admission Comments: No comment available   Primary Diagnosis:  <principal problem not specified> Principal Problem: <principal problem not specified>  Patient Active Problem List   Diagnosis Date Noted  . Sinus tachycardia   . Labile blood glucose   . History of 2019 novel coronavirus disease (COVID-19)   . Acute respiratory distress syndrome (ARDS) due to COVID-19 virus   . Acute blood loss anemia   . Tracheostomy status ([place])   . Tachycardia   . Diabetes mellitus type 2 in nonobese (HCC)   . Acute pulmonary embolism ([place])   . Debility 04/19/2019  . Acute respiratory failure with hypoxia and hypercapnia (HCC)   . Encounter for intubation   . History of ETT   . Status post tracheostomy ([place])   . Tracheostomy care ([place])   . On tube feeding diet   . ATN (acute tubular necrosis) ([place]) 03/12/2019  . AKI (acute kidney injury) ([place])   . Acute respiratory failure with hypoxia ([place])   . COVID-19   . Pressure injury of skin 02/28/2019  . ARDS (adult respiratory distress syndrome) ([place]) 02/23/2019    Expected Discharge Date: Expected Discharge Date: (TBD)  Team Members Present: Physician leading conference: Dr. Delice Lesch Social Worker Present: Lennart Pall, LCSW Nurse Present: Benjie Karvonen, RN;Karen Lovena Neighbours, RN PT Present: Georjean Mode, PT OT Present: Mariane Masters, OT SLP Present: Stormy Fabian, SLP PPS Coordinator present : Gunnar Fusi, SLP     Current Status/Progress Goal Weekly Team Focus  Medical   Debility secondary to  ARDS due to SARS COVID-19 infection  Improve mobility, CBGs, tachycardia  See above   Bowel/Bladder   Continent of bowel/bladderLBM 6//23/20  Remain continent  QS/PRN assess toileting needs   Swallow/Nutrition/ Hydration             ADL's   Supervision/Mod I  Mod I  O2 needs, activity tolerance, UB strengthening, dc planning, self-care retraining   Mobility   supervision, gait 100-150', needs up to 4 L/min supplemental O2 w/ mobility  mod/i household gait  endurance, decreasing supplemental O2 requirement, discharge planning and education   Communication             Safety/Cognition/ Behavioral Observations            Pain   No c/o pain     Assess qs/prn ,address all concerns and complaints with follow up evaluation   Skin   Bilateral facial scars around upper cheek area ,no drainage, otherwise skin is intact  Maintain skin integrity, prevent infection  QS/PRN assessment apply ordered cream/ointment to facial area,      *See Care Plan and progress notes for long and short-term goals.     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  Have spoken with [employer] Rep about the situation and await further instruction on new d/c options.  NA - pt is mod independent   Team Discussion:  Medically stable and still requiring oxygen - will  need for d/c.  Still needing consistent rest breaks but mod independent overall with ADLs and mobility.  See above for SW and d/c planning issues.  Revisions to Treatment Plan:  NA    Continued Need for Acute Rehabilitation Level of Care: The patient requires daily medical management by a physician with specialized training in physical medicine and rehabilitation for the following conditions: Daily direction of a multidisciplinary physical rehabilitation program to ensure safe treatment while eliciting the highest outcome that is of practical value to the patient.: Yes Daily medical management of patient stability for increased activity during participation in an intensive rehabilitation regime.: Yes Daily analysis of laboratory values and/or radiology reports with any subsequent need for medication adjustment of medical intervention for : Pulmonary problems;Diabetes problems;Other   I attest that I was present, lead the team conference, and concur with the assessment and plan of the team.   Donata Clay, Ainhoa Rallo 04/29/2019, 10:10 AM    Team conference was held via web/ teleconference due to [place] - 48

## 2019-04-29 NOTE — Progress Notes (Signed)
  Patient ID: Jonathan [name], male   DOB: 06-11-1971, 48 y.o.   MRN: [id number]     SATURATION QUALIFICATIONS: (This note is used to comply with regulatory documentation for home oxygen)  Patient Saturations on Room Air at Rest = 90%  Patient Saturations on Room Air while Ambulating = 72%  Patient Saturations on 3 Liters of oxygen while Ambulating = 94%  Please briefly explain why patient needs home oxygen:  Pt is recovering from SIRS COVID - 19 infection.    Reesa Chew, PA-C

## 2019-04-29 NOTE — Progress Notes (Signed)
[facility] PHYSICAL MEDICINE & REHABILITATION PROGRESS NOTE  Subjective/Complaints: Patient seen lying in bed this morning.  He states he slept well overnight.  He denies complaints.  ROS: Limited due to language, but appears to deny chest pain or shortness of breath.  Objective: Vital Signs: Blood pressure 122/83, pulse 82, temperature 98.1 F (36.7 C), temperature source Oral, resp. rate 18, height 5\' 5"  (1.651 m), weight 66.8 kg, SpO2 98 %. No results found. Recent Labs    04/26/19 0844  WBC 8.0  HGB 10.1*  HCT 33.1*  PLT 312   Recent Labs    04/26/19 0844  NA 135  K 4.1  CL 98  CO2 26  GLUCOSE 257*  BUN 20  CREATININE 0.99  CALCIUM 9.6    Physical Exam: BP 122/83 (BP Location: Left Arm)   Pulse 82   Temp 98.1 F (36.7 C) (Oral)   Resp 18   Ht 5\' 5"  (1.651 m)   Wt 66.8 kg   SpO2 98%   BMI 24.51 kg/m  Constitutional: No distress . Vital signs reviewed. HENT: Normocephalic.  Atraumatic. Neck: + Stoma. Eyes: EOMI.  No discharge. Cardiovascular: No JVD. Respiratory: Normal effort.  +[place]. GI: Non--distended. Musc: No edema or tenderness in extremities. Neurological: Alert Makes good eye contact with examiner.  Follows commands with gestural cues Motor:  Grossly 4-4+/5 throughout, stable Skin: Warm and dry.  Intact. Psychiatric: He has a normal mood and affect. Hisbehavior is normal.  Assessment/Plan: 1. Functional deficits secondary to debility status post COVID-19 which require 3+ hours per day of interdisciplinary therapy in a comprehensive inpatient rehab setting.  Physiatrist is providing close team supervision and 24 hour management of active medical problems listed below.  Physiatrist and rehab team continue to assess barriers to discharge/monitor patient progress toward functional and medical goals  Care Tool:  Bathing    Body parts bathed by patient: Right arm, Chest, Left arm, Abdomen, Front perineal area, Right upper leg, Left upper  leg, Face   Body parts bathed by helper: Left lower leg, Right lower leg, Buttocks     Bathing assist Assist Level: Minimal Assistance - Patient > 75%     Upper Body Dressing/Undressing Upper body dressing   What is the patient wearing?: Pull over shirt    Upper body assist Assist Level: Contact Guard/Touching assist    Lower Body Dressing/Undressing Lower body dressing      What is the patient wearing?: Pants, Underwear/pull up     Lower body assist Assist for lower body dressing: Minimal Assistance - Patient > 75%     Toileting Toileting    Toileting assist Assist for toileting: Contact Guard/Touching assist     Transfers Chair/bed transfer  Transfers assist     Chair/bed transfer assist level: Supervision/Verbal cueing     Locomotion Ambulation   Ambulation assist      Assist level: Supervision/Verbal cueing Assistive device: No Device Max distance: 100'   Walk 10 feet activity   Assist     Assist level: Supervision/Verbal cueing Assistive device: No Device   Walk 50 feet activity   Assist    Assist level: Supervision/Verbal cueing Assistive device: No Device    Walk 150 feet activity   Assist Walk 150 feet activity did not occur: Safety/medical concerns  Assist level: Supervision/Verbal cueing Assistive device: No Device    Walk 10 feet on uneven surface  activity   Assist Walk 10 feet on uneven surfaces activity did not occur: Safety/medical concerns  Wheelchair     Assist Will patient use wheelchair at discharge?: No Type of Wheelchair: Manual Wheelchair activity did not occur: N/A  Wheelchair assist level: Supervision/Verbal cueing, Set up assist Max wheelchair distance: 334ft    Wheelchair 50 feet with 2 turns activity    Assist    Wheelchair 50 feet with 2 turns activity did not occur: N/A   Assist Level: Supervision/Verbal cueing, Set up assist   Wheelchair 150 feet activity      Assist Wheelchair 150 feet activity did not occur: N/A   Assist Level: Supervision/Verbal cueing, Set up assist      Medical Problem List and Plan: 1.Debilitysecondary to ARDS due to SARS COVID-19 infection. Repeat COVID test x2-  Continue CIR  Await plans for discharge disposition.   2. Antithrombotics: -DVT/anticoagulation:CT angiogram of chest not completed due to AKI. Lower extremity Dopplers negative  Continue Xarelto  antiplatelet therapy: See above 3. Pain Management:Oxycodone as needed 4. Mood:Klonopin 0.25 mg daily -antipsychotic agents: N/A 5. Neuropsych: This patientiscapable of making decisions on hisown behalf. 6. Skin/Wound Care:Routine skin care 7. Fluids/Electrolytes/Nutrition:Routine in and outs  Good p.o. intake. 8.AKI. Status post permacath 03/17/2019. Patient did initially receive intermittent dialysis.   Cr.  0.99 on 6/22, improving  Cont to monitor 9.Acute blood loss anemia. Patient has been transfused during hospital course. Continue iron supplement  Hb 10.1 on 6/22  Cont to monitor 10. Respiratory failure   Tracheostomy 03/16/2019.  Decannulated on 6/16  Wean supplemental oxygen as tolerated, continues to require on 6/25 11.Intermittent sinus tachycardia.   Continue Lopressor 25 mg twice daily  Secondary to endurance, improving on 6/25   Vitals:   04/28/19 1932 04/29/19 0621  BP: 120/73 122/83  Pulse: 97 82  Resp: 16 18  Temp: 97.8 F (36.6 C) 98.1 F (36.7 C)  SpO2: 96% 98%   12.Diabetes mellitus.Hemoglobin A1c 5.5.   Lantus insulin 12 units twice daily.   NovoLog6 U with meal will increase to 7U   Improving on 6/25 CBG (last 3)  Recent Labs    04/28/19 1729 04/28/19 2048 04/29/19 0700  GLUCAP 128* 133* 95    LOS: 10 days A FACE TO FACE EVALUATION WAS PERFORMED  Jonathan [name] 04/29/2019, 8:15 AM

## 2019-04-29 NOTE — Progress Notes (Signed)
Hypoglycemic Event  CBG: 64 at 1716  Treatment: 222 ml of coca cola. Pt ate dinner.   Symptoms: none  Follow-up CBG: Time:1757 CBG Result:181  Possible Reasons for Event: unknown  Comments/MD notified: protocol followed    Sandria Senter Berk Pilot

## 2019-04-29 NOTE — Progress Notes (Signed)
Occupational Therapy Session Note  Patient Details  Name: Jonathan [name] MRN: [id number] Date of Birth: April 25, 1971  Today's Date: 04/29/2019 OT Individual Time: 7412-8786 OT Individual Time Calculation (min): 71 min    Short Term Goals: Week 2:  OT Short Term Goal 1 (Week 2): LTG=STG 2/2 ELOS  Skilled Therapeutic Interventions/Progress Updates:    Pt greeted seated in recliner finishing up Zoom call with family and agreeable to OT treatment session focused on self-care retraining, endurance, and O2 management. Pt on 2L of O2 at rest with SpO2 98 s/p activity. Pt reported some SOB during conversation with family and educated on importance of rest breaks and breathing even while talking. Pt placed on 4L for BADL tasks, then ambulated into bathroom and onto commode with supervision. Pt voided bowel and bladder and completed peri-care mod I. Pt then ambulated into bathroom and doffed clothing seated on bench. Pt bathed with supervision and did a good job managing nasal canula independently. SpO2 88% at lowest on 4L and able to recover with rest breaks. Dressing completed supervision without OT assist. Pt tolerated standing at sink for 5 minute grooming tasks, then took rest break with nursing administered medication. Pt then ambulated to therapy gym w/ supervision. Worked on standing balance/endurance and UB there-ex with pt standing on foam and completing 3 sets of 10 straight arm raises, bicep curl, and upright row using 2 lb dowel rod. Rest breaks in between sets. Pt then completed 5 mins on Sci[employer] arm bike on level 1.5. SpO2 97 HR 122. Pt ambulated back to room supervision and left seated in recliner on 2L of O2 with needs met.   Therapy Documentation Precautions:  Precautions Precautions: Other (comment) Precaution Comments: trach recently decannulated, watch O2 and HR Restrictions Weight Bearing Restrictions: No Pain:   denies pain  Therapy/Group: Individual Therapy  Valma Cava 04/29/2019, 1:22 PM

## 2019-04-30 ENCOUNTER — Inpatient Hospital Stay (HOSPITAL_COMMUNITY): Payer: Self-pay | Admitting: Physical Therapy

## 2019-04-30 ENCOUNTER — Inpatient Hospital Stay (HOSPITAL_COMMUNITY): Payer: Self-pay

## 2019-04-30 ENCOUNTER — Inpatient Hospital Stay (HOSPITAL_COMMUNITY): Payer: Self-pay | Admitting: Occupational Therapy

## 2019-04-30 LAB — GLUCOSE, CAPILLARY
Glucose-Capillary: 87 mg/dL (ref 70–99)
Glucose-Capillary: 166 mg/dL — ABNORMAL HIGH (ref 70–99)

## 2019-04-30 MED ORDER — NOVOLOG FLEXPEN 100 UNIT/ML ~~LOC~~ SOPN: 7.0000 [IU] | PEN_INJECTOR | Freq: Three times a day (TID) | SUBCUTANEOUS | 11 refills | Status: DC

## 2019-04-30 MED ORDER — INSULIN GLARGINE 100 UNIT/ML SOLOSTAR PEN: 12.0000 [IU] | PEN_INJECTOR | Freq: Two times a day (BID) | SUBCUTANEOUS | 11 refills | Status: DC

## 2019-04-30 MED ORDER — ACETAMINOPHEN 325 MG PO TABS: 650.0000 mg | ORAL_TABLET | Freq: Four times a day (QID) | ORAL | Status: AC | PRN

## 2019-04-30 MED ORDER — CLONAZEPAM 0.25 MG PO TBDP: 0.2500 mg | ORAL_TABLET | Freq: Every day | ORAL | 0 refills | Status: AC

## 2019-04-30 MED ORDER — RIVAROXABAN 20 MG PO TABS: 20.0000 mg | ORAL_TABLET | Freq: Every day | ORAL | 1 refills | Status: AC

## 2019-04-30 MED ORDER — METOPROLOL TARTRATE 25 MG PO TABS: 25.0000 mg | ORAL_TABLET | Freq: Two times a day (BID) | ORAL | 0 refills | Status: AC

## 2019-04-30 MED ORDER — LEVALBUTEROL TARTRATE 45 MCG/ACT IN AERO: 2.0000 | INHALATION_SPRAY | Freq: Four times a day (QID) | RESPIRATORY_TRACT | 12 refills | Status: AC | PRN

## 2019-04-30 MED ORDER — METFORMIN HCL 500 MG PO TABS: 500.0000 mg | ORAL_TABLET | Freq: Two times a day (BID) | ORAL | 1 refills | Status: AC

## 2019-04-30 MED FILL — LEVALBUTEROL TAR HFA 45MCG: 45 | 25 days supply | Qty: 15 | Fill #0

## 2019-04-30 MED FILL — clonazePAM 0.25 MG TBDP: 0.25 | 20 days supply | Qty: 20 | Fill #0

## 2019-04-30 MED FILL — METOPROLOL TARTRATE 25 MG T: 25 | 30 days supply | Qty: 60 | Fill #0

## 2019-04-30 MED FILL — metFORMIN HCL 500 MG TABS: 500 | 30 days supply | Qty: 60 | Fill #0

## 2019-04-30 MED FILL — XARELTO 20 MG TABLET: 20 | 30 days supply | Qty: 30 | Fill #0

## 2019-04-30 NOTE — Progress Notes (Signed)
Physical Therapy Session Note  Patient Details  Name: Jonathan [name] MRN: [id number] Date of Birth: 1970-12-31  Today's Date: 04/30/2019 PT Individual Time: 0900-0955 PT Individual Time Calculation (min): 55 min   Short Term Goals: Week 2:  PT Short Term Goal 1 (Week 2): =LTGs due to ELOS  Skilled Therapeutic Interventions/Progress Updates:    pt performs gait throughout unit on 3LO2 with supervision without AD.  Transfers performed with mod I.  Gait with ball toss, ball bounce, obstacle negotiation, fwd/bkwd all with supervision.  Pt performs standing balance on foam with mini squats, heel raises, side lunges with CGA for balance. nustep x 8 minutes for continued endurance training. Pt continues to require rest breaks due to fatigue but is making great progress in endurance, balance and mobility.  Therapy Documentation Precautions:  Precautions Precautions: Other (comment) Precaution Comments: trach recently decannulated, watch O2 and HR Restrictions Weight Bearing Restrictions: No Vital Signs: Therapy Vitals Temp: 98.2 F (36.8 C) Temp Source: Oral Pulse Rate: 79 Resp: 18 BP: 119/77 Patient Position (if appropriate): Lying Oxygen Therapy SpO2: 100 % O2 Device: Nasal Cannula O2 Flow Rate (L/min): 3 L/min Pain:  no c/o pain     Therapy/Group: Individual Therapy  Jonathan [name] 04/30/2019, 9:39 AM

## 2019-04-30 NOTE — Progress Notes (Signed)
Occupational Therapy Session Note  Patient Details  Name: Jonathan [name] MRN: [id number] Date of Birth: 10-Apr-1971  Today's Date: 04/30/2019 OT Individual Time: 1100-1158 OT Individual Time Calculation (min): 58 min    Short Term Goals: Week 1:  OT Short Term Goal 1 (Week 1): Pt will tolerate standing at the sink for BADL tasks for 5 minutes without rest break  OT Short Term Goal 1 - Progress (Week 1): Met OT Short Term Goal 2 (Week 1): Pt will maintain SpO2 above 90% on 4L during BADL tasks. OT Short Term Goal 2 - Progress (Week 1): Progressing toward goal OT Short Term Goal 3 (Week 1): Pt will complete LB dressing with supervision OT Short Term Goal 3 - Progress (Week 1): Met  Skilled Therapeutic Interventions/Progress Updates:    1:1. Pt received in recliner with interpreter present. Pt with no pain and remains on 4-6L O2 with O2 >94%. Pt gathers clothing and bathes MOD I. Pt dresses in standing with MOD I for shirt and S for pants and underwear. Discussed energy conservation/seated positioning for dressing for safety when donning LB clothing. Pt grooms in standing with MOD I. Pt ambulates to dayroom with A only to carry O2 tank. Pt completes standing weight shifting activities on wii fit board with supervision with seated rest breaks in between. Pt requires VC for calibrating weight shifting mid game for more success. exied session with pt seated in recliner, exit alarm on and call light in reach  Therapy Documentation Precautions:  Precautions Precautions: Other (comment) Precaution Comments: trach recently decannulated, watch O2 and HR Restrictions Weight Bearing Restrictions: No General:   Vital Signs:   Pain:   ADL: ADL Eating: Set up Grooming: Contact guard Upper Body Bathing: Contact guard Lower Body Bathing: Minimal assistance Upper Body Dressing: Contact guard Lower Body Dressing: Minimal assistance Toileting: Contact guard Toilet Transfer: Contact  guard Contractor    Praxis   Exercises:   Other Treatments:     Therapy/Group: Individual Therapy  Tonny Branch 04/30/2019, 12:04 PM

## 2019-04-30 NOTE — Progress Notes (Signed)
Jonathan [name] PHYSICAL MEDICINE & REHABILITATION PROGRESS NOTE  Subjective/Complaints: Patient seen laying in bed this morning.  He indicates he slept well overnight.  ROS: Limited due to language, but appears to deny chest pain or shortness of breath.  Objective: Vital Signs: Blood pressure 119/77, pulse 79, temperature 98.2 F (36.8 C), temperature source Oral, resp. rate 18, height 5\' 5"  (1.651 m), weight 67.2 kg, SpO2 100 %. No results found. No results for input(s): WBC, HGB, HCT, PLT in the last 72 hours. No results for input(s): NA, K, CL, CO2, GLUCOSE, BUN, CREATININE, CALCIUM in the last 72 hours.  Physical Exam: BP 119/77 (BP Location: Left Arm)   Pulse 79   Temp 98.2 F (36.8 C) (Oral)   Resp 18   Ht 5\' 5"  (1.651 m)   Wt 67.2 kg   SpO2 100%   BMI 24.65 kg/m  Constitutional: No distress . Vital signs reviewed. HENT: Normocephalic.  Atraumatic. Neck: + Stoma. Eyes: EOMI.  No discharge. Cardiovascular: No JVD. Respiratory: Normal effort.  +[place]. GI: Non--distended. Musc: No alert or tenderness in extremities. Neurological: Alert Makes good eye contact with examiner.  Follows commands with gestural cues Motor:  Grossly 4+/5 throughout Skin: Warm and dry.  Intact. Psychiatric: He has a normal mood and affect. Hisbehavior is normal.  Assessment/Plan: 1. Functional deficits secondary to debility status post COVID-19 which require 3+ hours per day of interdisciplinary therapy in a comprehensive inpatient rehab setting.  Physiatrist is providing close team supervision and 24 hour management of active medical problems listed below.  Physiatrist and rehab team continue to assess barriers to discharge/monitor patient progress toward functional and medical goals  Care Tool:  Bathing    Body parts bathed by patient: Right arm, Chest, Left arm, Abdomen, Front perineal area, Right upper leg, Left upper leg, Face   Body parts bathed by helper: Left lower leg, Right lower  leg, Buttocks     Bathing assist Assist Level: Minimal Assistance - Patient > 75%     Upper Body Dressing/Undressing Upper body dressing   What is the patient wearing?: Pull over shirt    Upper body assist Assist Level: Contact Guard/Touching assist    Lower Body Dressing/Undressing Lower body dressing      What is the patient wearing?: Pants, Underwear/pull up     Lower body assist Assist for lower body dressing: Minimal Assistance - Patient > 75%     Toileting Toileting    Toileting assist Assist for toileting: Contact Guard/Touching assist     Transfers Chair/bed transfer  Transfers assist     Chair/bed transfer assist level: Supervision/Verbal cueing     Locomotion Ambulation   Ambulation assist      Assist level: Supervision/Verbal cueing Assistive device: No Device Max distance: 150'   Walk 10 feet activity   Assist     Assist level: Supervision/Verbal cueing Assistive device: No Device   Walk 50 feet activity   Assist    Assist level: Supervision/Verbal cueing Assistive device: No Device    Walk 150 feet activity   Assist Walk 150 feet activity did not occur: Safety/medical concerns  Assist level: Supervision/Verbal cueing Assistive device: No Device    Walk 10 feet on uneven surface  activity   Assist Walk 10 feet on uneven surfaces activity did not occur: Safety/medical concerns         Wheelchair     Assist Will patient use wheelchair at discharge?: No Type of Wheelchair: Manual Wheelchair activity did not  occur: N/A  Wheelchair assist level: Supervision/Verbal cueing, Set up assist Max wheelchair distance: 33ft    Wheelchair 50 feet with 2 turns activity    Assist    Wheelchair 50 feet with 2 turns activity did not occur: N/A   Assist Level: Supervision/Verbal cueing, Set up assist   Wheelchair 150 feet activity     Assist Wheelchair 150 feet activity did not occur: N/A   Assist Level:  Supervision/Verbal cueing, Set up assist      Medical Problem List and Plan: 1.Debilitysecondary to ARDS due to SARS COVID-19 infection. Repeat COVID test x2-  Continue CIR  Continue to await plans for discharge disposition, please see social worker note.   2. Antithrombotics: -DVT/anticoagulation:CT angiogram of chest not completed due to AKI. Lower extremity Dopplers negative  Continue Xarelto  antiplatelet therapy: See above 3. Pain Management:Oxycodone as needed 4. Mood:Klonopin 0.25 mg daily -antipsychotic agents: N/A 5. Neuropsych: This patientiscapable of making decisions on hisown behalf. 6. Skin/Wound Care:Routine skin care 7. Fluids/Electrolytes/Nutrition:Routine in and outs  Good p.o. intake.  On 6/26 8.AKI. Status post permacath 03/17/2019. Patient did initially receive intermittent dialysis.   Cr.  0.99 on 6/22, improving  Cont to monitor 9.Acute blood loss anemia. Patient has been transfused during hospital course. Continue iron supplement  Hb 10.1 on 6/22  Cont to monitor 10. Respiratory failure   Tracheostomy 03/16/2019.  Decannulated on 6/16  Wean supplemental oxygen as tolerated, continues to require on 6/26 11.Intermittent sinus tachycardia.   Continue Lopressor 25 mg twice daily  Secondary to endurance, improving on 6/25   Vitals:   04/29/19 1952 04/30/19 0549  BP: 124/82 119/77  Pulse: 82 79  Resp: 18 18  Temp: 97.8 F (36.6 C) 98.2 F (36.8 C)  SpO2: 100% 100%   12.Diabetes mellitus.Hemoglobin A1c 5.5.   Lantus insulin 12 units twice daily.   NovoLog6 U with meal will increase to 7U   Labile on 6/26 CBG (last 3)  Recent Labs    04/29/19 1955 04/29/19 2151 04/30/19 0650  GLUCAP 167* 145* 87    LOS: 11 days A FACE TO FACE EVALUATION WAS PERFORMED  Jonathan [name] Jonathan [name] 04/30/2019, 8:48 AM

## 2019-04-30 NOTE — Progress Notes (Signed)
Occupational Therapy Discharge Summary  Patient Details  Name: Jonathan [name] MRN: [id number] Date of Birth: Dec 29, 1970  Today's Date: 04/30/2019 OT Individual Time: 1254-1411 OT Individual Time Calculation (min): 77 min   Pt greeted seated in recliner finishing lunch and agreeable to OT treatment session. SpO2 maintained at 90% and obove on 4L and frequent rest breaks. Pt ambulated to therapy gym mod I. Standing balance/endurance with corn hole toss and wii bowling activity. Pt tolerated extended bouts of standing and bending over tasks. OT provided pt with HEP for UB there-ex using level 2 and level 3 theraband. Pt demonstrated understanding. OT educated pt on how to adjust O2 tank and had him practice placing nasal canula and oxygen tube on nozzle. Practiced turning O2 up to 4L-Pt demonstrated understanding. Pt ambulated back to room and was left seated in recliner with needs met awaiting case manager with interpreter.   Patient has met 11 of 11 long term goals due to improved activity tolerance, improved balance, postural control and ability to compensate for deficits.  Patient to discharge at overall Modified Independent level.   Reasons goals not met: n/a  Recommendation:  Patient will benefit from ongoing skilled OT services in home health setting to continue to advance functional skills in the area of BADL  Equipment: 3-1 BSC, home O2  Reasons for discharge: treatment goals met and discharge from hospital  Patient/family agrees with progress made and goals achieved: Yes  OT Discharge Precautions/Restrictions  Restrictions Weight Bearing Restrictions: No Pain   denies pain ADL ADL Eating: Independent Grooming: Independent Upper Body Bathing: Independent Lower Body Bathing: Independent Upper Body Dressing: Independent Lower Body Dressing: Independent Toileting: Independent Toilet Transfer: Independent Tub/Shower Transfer: Independent Civil engineer, contracting: Within Functional Limits Praxis Praxis: Intact Cognition Overall Cognitive Status: Within Functional Limits for tasks assessed Arousal/Alertness: Awake/alert Orientation Level: Oriented X4 Memory: Appears intact Awareness: Appears intact Problem Solving: Appears intact Safety/Judgment: Appears intact Sensation Sensation Light Touch: Appears Intact Coordination Gross Motor Movements are Fluid and Coordinated: Yes Fine Motor Movements are Fluid and Coordinated: Yes Mobility  Bed Mobility Rolling Right: Independent Rolling Left: Independent Supine to Sit: Independent Sit to Supine: Independent Transfers Sit to Stand: Independent Stand to Sit: Independent  Trunk/Postural Assessment  Cervical Assessment Cervical Assessment: Within Functional Limits Thoracic Assessment Thoracic Assessment: Within Functional Limits Lumbar Assessment Lumbar Assessment: Within Functional Limits  Balance Static Sitting Balance Static Sitting - Level of Assistance: 7: Independent Dynamic Sitting Balance Dynamic Sitting - Level of Assistance: 7: Independent Static Standing Balance Static Standing - Level of Assistance: 7: Independent Extremity/Trunk Assessment RUE Assessment RUE Assessment: Within Functional Limits General Strength Comments: 4/5 LUE Assessment LUE Assessment: Within Functional Limits General Strength Comments: 4/5   [name] [name] 04/30/2019, 3:55 PM

## 2019-04-30 NOTE — Progress Notes (Signed)
Physical Therapy Discharge Summary  Patient Details  Name: Jonathan [name] MRN: [id number] Date of Birth: November 18, 1970  Patient has met 8 of 9 long term goals due to improved activity tolerance, improved balance, increased strength and functional use of  right lower extremity and left lower extremity.  Patient to discharge at an ambulatory level Modified Independent.   Patient's care partner unavailable to provide the necessary physical assistance at discharge. Pt is able to direct his care appropriately, has good safety awareness, and good awareness of his own deficits.   Reasons goals not met: Pt continues to require supervision on stairs 2/2 balance deficits, however this is adequate for d/c as he will have someone with him at all times when leaving his home and he is able to direct his own care appropriately.   Recommendation:  Patient will benefit from ongoing skilled PT services in home health setting to continue to advance safe functional mobility, address ongoing impairments in endurance, supplemental oxygen requirement, functional balance, and functional strength, and minimize fall risk.  Equipment: No equipment provided  Reasons for discharge: treatment goals met and discharge from hospital  Patient/family agrees with progress made and goals achieved: Yes  PT Discharge Precautions/Restrictions Precautions Precautions: None Restrictions Weight Bearing Restrictions: No Vision/Perception  Perception Perception: Within Functional Limits Praxis Praxis: Intact  Cognition Overall Cognitive Status: Within Functional Limits for tasks assessed Arousal/Alertness: Awake/alert Orientation Level: Oriented X4 Memory: Appears intact Awareness: Appears intact Problem Solving: Appears intact Safety/Judgment: Appears intact Sensation Sensation Light Touch: Appears Intact Coordination Gross Motor Movements are Fluid and Coordinated: Yes Motor  Motor Motor: Within Functional  Limits Motor - Discharge Observations: generalized weakness remains, although improved  Mobility Bed Mobility Bed Mobility: Rolling Right;Rolling Left;Supine to Sit;Sit to Supine Rolling Right: Independent Rolling Left: Independent Supine to Sit: Independent Sit to Supine: Independent Transfers Transfers: Sit to Stand;Stand to Sit;Stand Pivot Transfers Sit to Stand: Independent with assistive device Stand to Sit: Independent with assistive device Stand Pivot Transfers: Independent with assistive device Transfer (Assistive device): Other (Comment)(armrests) Locomotion  Gait Ambulation: Yes Gait Assistance: Independent Gait Distance (Feet): 150 Feet Assistive device: None Stairs / Additional Locomotion Stairs: Yes Stairs Assistance: Supervision/Verbal cueing Stair Management Technique: One rail Left Number of Stairs: 4 Height of Stairs: 6 Wheelchair Mobility Wheelchair Mobility: No  Trunk/Postural Assessment  Cervical Assessment Cervical Assessment: Within Functional Limits Thoracic Assessment Thoracic Assessment: Within Functional Limits Lumbar Assessment Lumbar Assessment: Within Functional Limits Postural Control Postural Control: Within Functional Limits  Balance Balance Balance Assessed: Yes Static Sitting Balance Static Sitting - Balance Support: No upper extremity supported;Feet supported Static Sitting - Level of Assistance: 7: Independent Dynamic Sitting Balance Dynamic Sitting - Balance Support: No upper extremity supported;Feet supported Dynamic Sitting - Level of Assistance: 7: Independent Static Standing Balance Static Standing - Balance Support: No upper extremity supported;During functional activity Static Standing - Level of Assistance: 7: Independent Dynamic Standing Balance Dynamic Standing - Balance Support: No upper extremity supported;During functional activity Dynamic Standing - Level of Assistance: 6: Modified independent (Device/Increase  time) Extremity Assessment  RLE Assessment RLE Assessment: Exceptions to [facility] Passive Range of Motion (PROM) Comments: WFL General Strength Comments: Globally 4/5, poor muscular endurance LLE Assessment LLE Assessment: Exceptions to [facility] Passive Range of Motion (PROM) Comments: [facility] General Strength Comments: Globally 4/5, poor muscular endurance    Jonathan [name] 04/30/2019, 9:09 PM

## 2019-04-30 NOTE — Progress Notes (Signed)
Education for discharge complete with PA, RN, translator and SW present. Medications explained and written out in Spanish. Escorted to car with RN and SW all belongings accounted for and oxygen supplied by 3rd party . No complaints or further questions.

## 2019-05-03 ENCOUNTER — Inpatient Hospital Stay (HOSPITAL_COMMUNITY): Payer: Self-pay | Admitting: Occupational Therapy

## 2019-05-03 ENCOUNTER — Inpatient Hospital Stay (HOSPITAL_COMMUNITY): Payer: Self-pay | Admitting: Physical Therapy

## 2019-05-03 NOTE — Progress Notes (Signed)
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  Plan then changed to a d/c to a home in [place], [place] which is owned by the [employer] with one other person in the home with him.  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  Follow-up services arranged: DME: oxygen via [place] and Patient/Family has no preference for HH/DME agencies  Comments (or additional information):    Contact persons:       Farm union representative, Gayla Medicus @ [phone]  At the time of d/c, I was awaiting information from Ms. Ferd Glassing about which medical clinic pt would follow up with in that area.  Once I receive this info, I will send medical records.  Patient/Family verbalized understanding of follow-up arrangements: Yes  Individual responsible for coordination of the follow-up plan: pt  Confirmed correct DME delivered: Socrates Cahoon 05/03/2019    Cathy Ropp

## 2019-05-04 ENCOUNTER — Telehealth: Payer: Self-pay | Admitting: Registered Nurse

## 2019-05-04 NOTE — Telephone Encounter (Signed)
Placed a call to [employer], the phone number for Mr. Jonathan [name]  In the system not working.  Placed a call to Ms. Jonathan [name], no answer, left voicemail.  Sent e-mail to [place], she doesn't have another number for Mr. Jonathan [name].

## 2019-05-13 ENCOUNTER — Encounter: Payer: Self-pay | Attending: Physical Medicine & Rehabilitation | Admitting: Physical Medicine & Rehabilitation

## 2019-06-26 IMAGING — DX PORTABLE CHEST - 1 VIEW
1 series · 1 of 1 positions shown · non-contrast
Comparison: 03/25/2019

CLINICAL DATA: Hypoxia, nausea, vomiting

EXAM:
PORTABLE CHEST 1 VIEW

[chest ap]
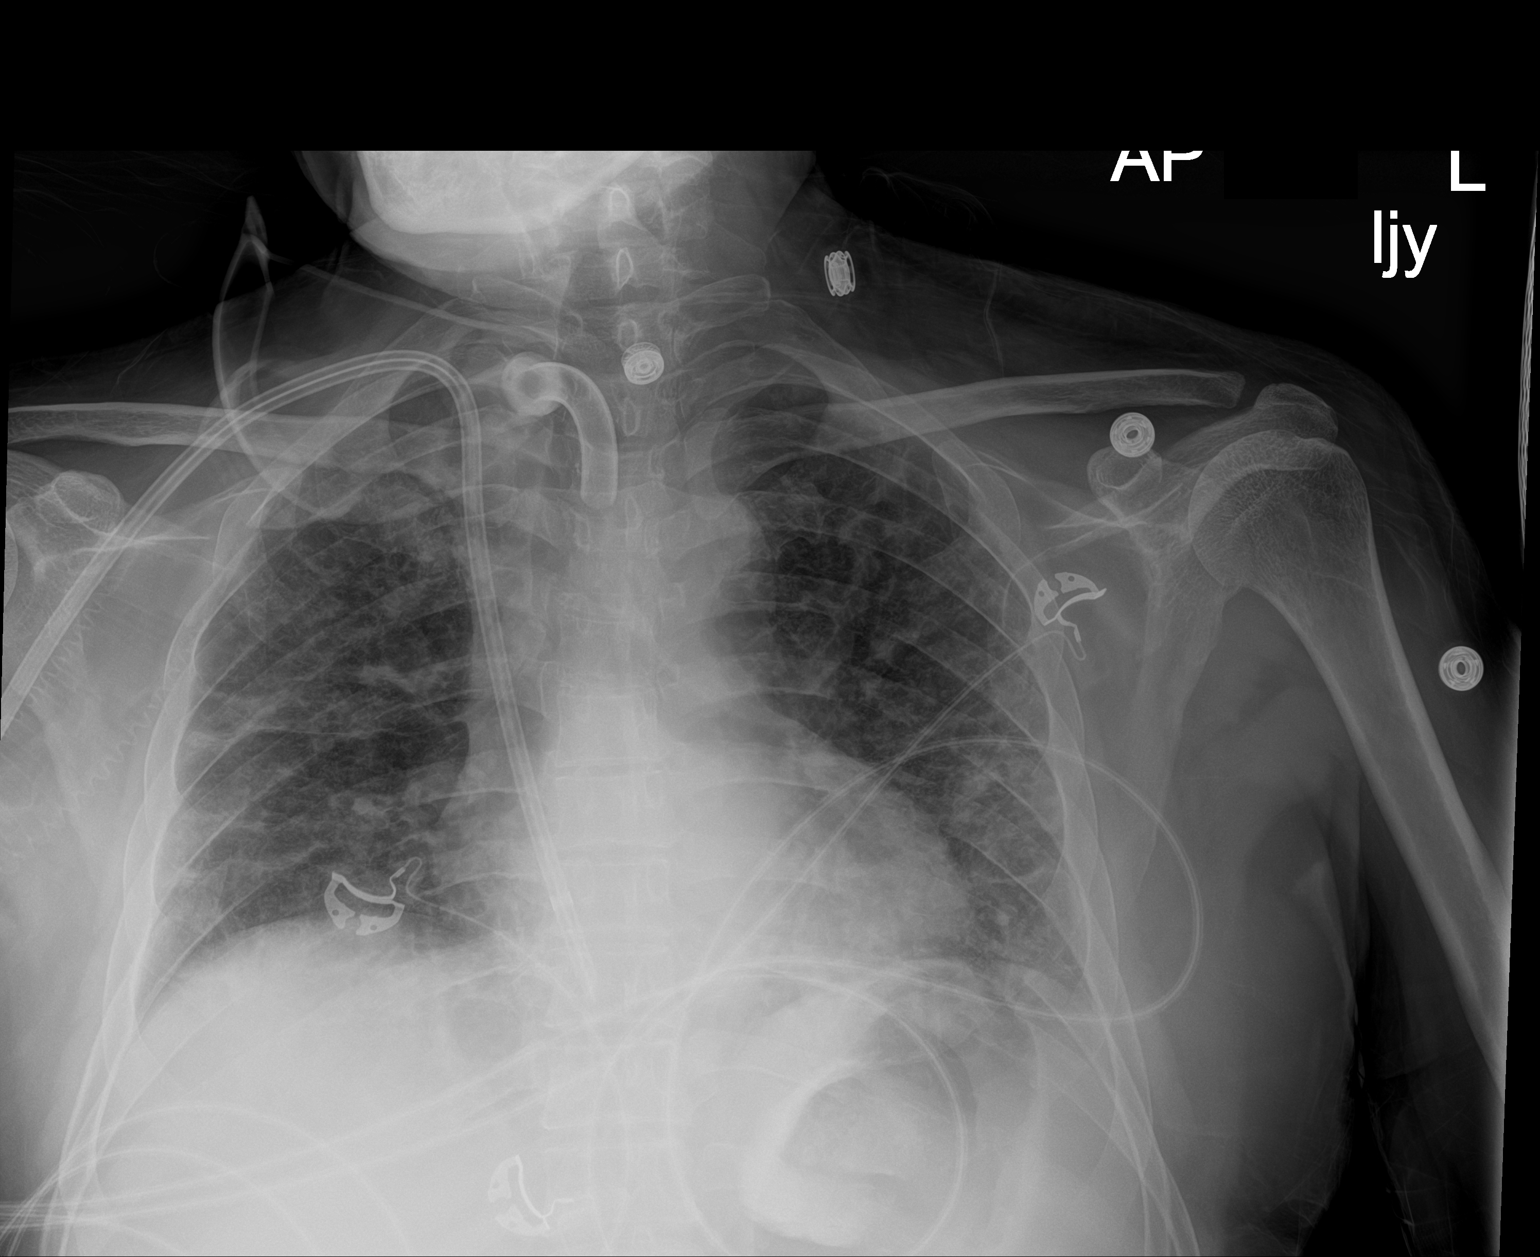

[1 of 1 positions shown; findings below may reference images not displayed]

FINDINGS: Patchy bilateral airspace disease again noted, minimally improved
since prior study. Low lung volumes. Right dialysis catheter remains
in the right atrium. Tracheostomy tube is unchanged. Interval
removal of NG tube. Heart is normal size.
IMPRESSION: Low lung volumes. Patchy bilateral airspace disease, slightly
improved.

## 2019-07-05 IMAGING — DX PORTABLE CHEST - 1 VIEW
1 series · 1 of 1 positions shown · non-contrast
Comparison: 04/03/2019

CLINICAL DATA: Worsening shortness of breath

EXAM:
PORTABLE CHEST 1 VIEW

[chest ap]
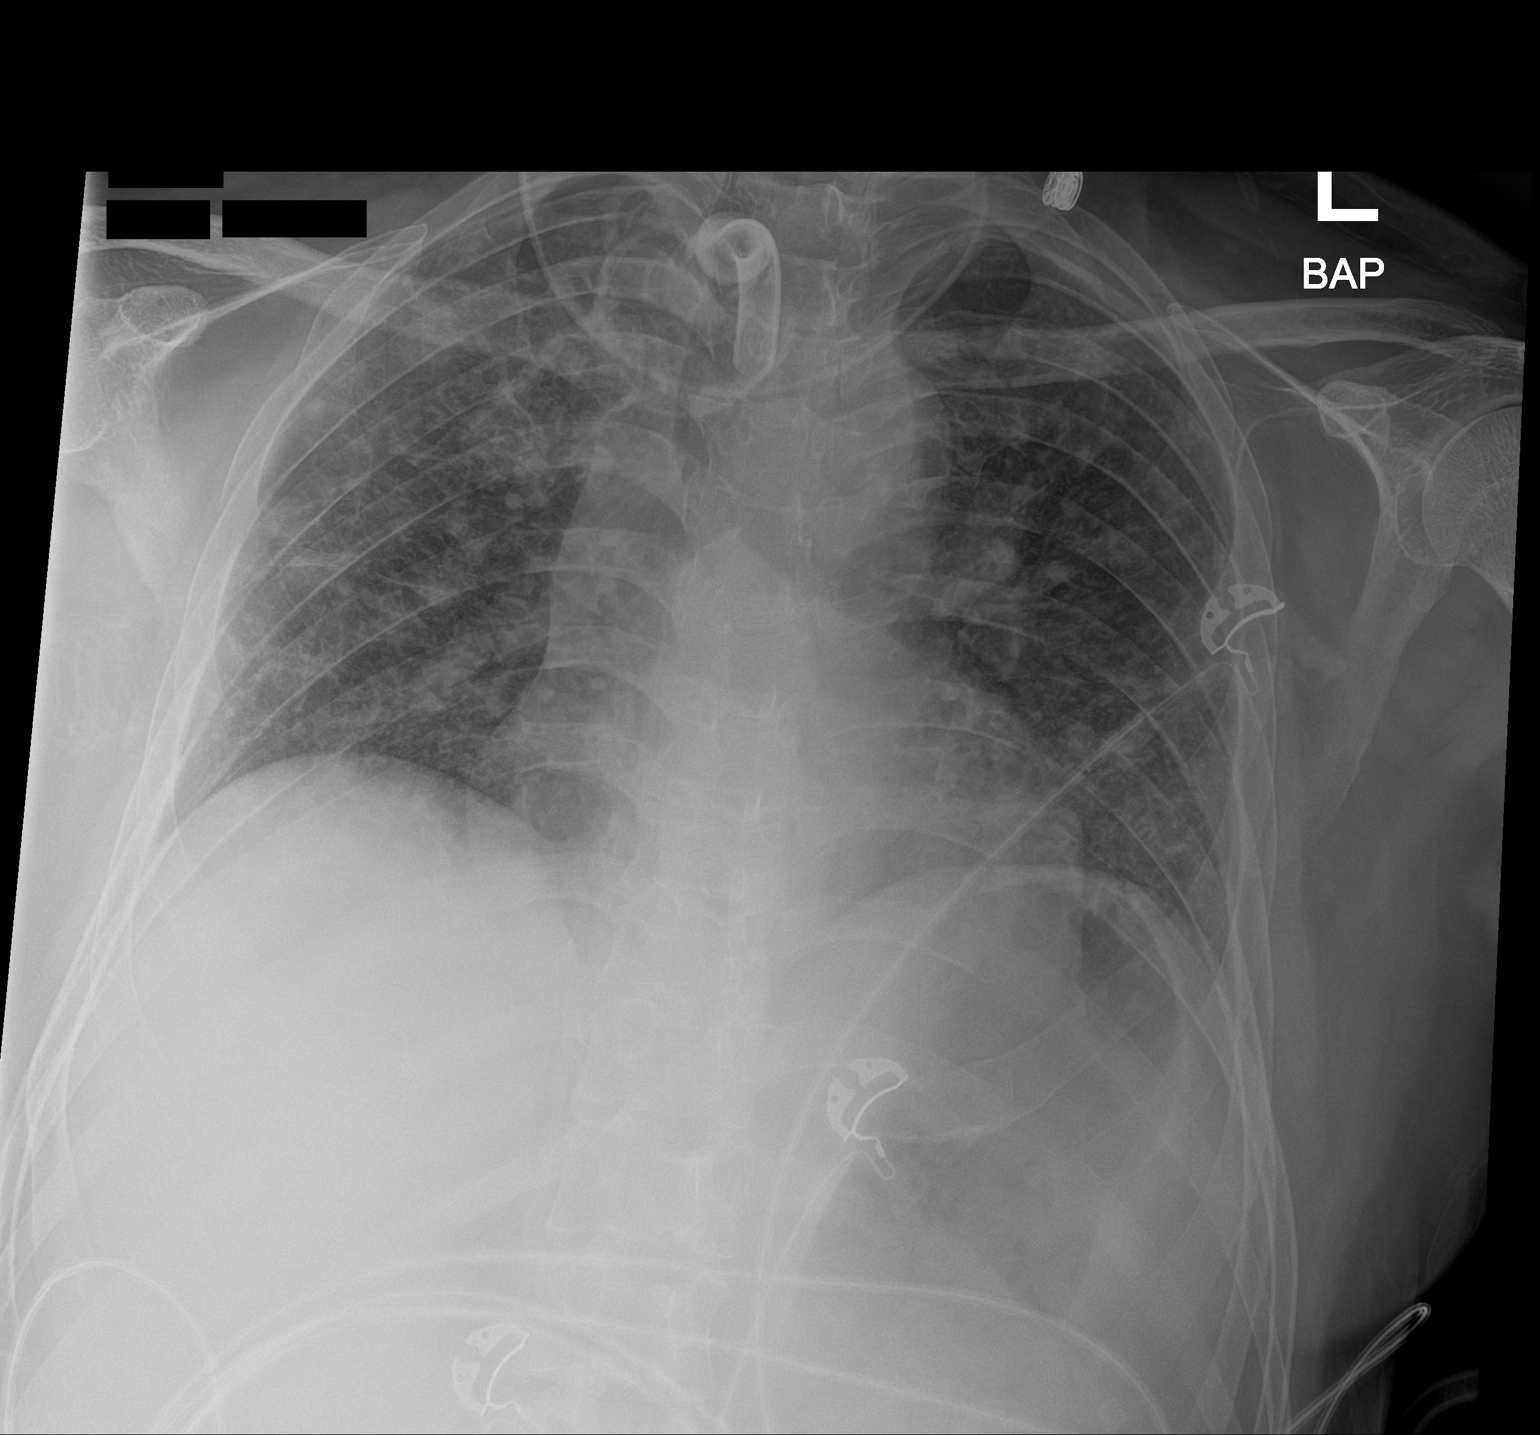

[1 of 1 positions shown; findings below may reference images not displayed]

FINDINGS: Tracheostomy remains in place. Central line is been removed. Heart
size is normal allowing for technical factors. Widespread patchy
bilateral pulmonary infiltrates persist. These are similar or
slightly progressed. No visible effusion.
IMPRESSION: Persistent bilateral patchy pulmonary infiltrates, similar or
slightly worsened.
